# Patient Record
Sex: Male | Born: 1960
Health system: Southern US, Community
[De-identification: ages and names within clinical notes are randomized; demographics above are authoritative.]

## PROBLEM LIST (undated history)

## (undated) DIAGNOSIS — E119 Type 2 diabetes mellitus without complications: Secondary | ICD-10-CM

## (undated) DIAGNOSIS — Z973 Presence of spectacles and contact lenses: Secondary | ICD-10-CM

## (undated) DIAGNOSIS — N400 Enlarged prostate without lower urinary tract symptoms: Secondary | ICD-10-CM

## (undated) DIAGNOSIS — J189 Pneumonia, unspecified organism: Secondary | ICD-10-CM

## (undated) DIAGNOSIS — M1990 Unspecified osteoarthritis, unspecified site: Secondary | ICD-10-CM

## (undated) DIAGNOSIS — M5030 Other cervical disc degeneration, unspecified cervical region: Secondary | ICD-10-CM

## (undated) DIAGNOSIS — M25519 Pain in unspecified shoulder: Secondary | ICD-10-CM

## (undated) DIAGNOSIS — E785 Hyperlipidemia, unspecified: Secondary | ICD-10-CM

## (undated) DIAGNOSIS — K5790 Diverticulosis of intestine, part unspecified, without perforation or abscess without bleeding: Secondary | ICD-10-CM

## (undated) DIAGNOSIS — G43909 Migraine, unspecified, not intractable, without status migrainosus: Secondary | ICD-10-CM

## (undated) DIAGNOSIS — K648 Other hemorrhoids: Secondary | ICD-10-CM

## (undated) DIAGNOSIS — R7612 Nonspecific reaction to cell mediated immunity measurement of gamma interferon antigen response without active tuberculosis: Secondary | ICD-10-CM

## (undated) DIAGNOSIS — F419 Anxiety disorder, unspecified: Secondary | ICD-10-CM

## (undated) DIAGNOSIS — K219 Gastro-esophageal reflux disease without esophagitis: Secondary | ICD-10-CM

## (undated) DIAGNOSIS — I700 Atherosclerosis of aorta: Secondary | ICD-10-CM

## (undated) DIAGNOSIS — G894 Chronic pain syndrome: Secondary | ICD-10-CM

## (undated) DIAGNOSIS — M6740 Ganglion, unspecified site: Secondary | ICD-10-CM

## (undated) DIAGNOSIS — M75101 Unspecified rotator cuff tear or rupture of right shoulder, not specified as traumatic: Secondary | ICD-10-CM

## (undated) HISTORY — PX: COLONOSCOPY: SHX174

## (undated) HISTORY — PX: SHOULDER ARTHROSCOPY WITH ROTATOR CUFF REPAIR: SHX5685

## (undated) HISTORY — PX: KNEE ARTHROSCOPY W/ MENISCAL REPAIR: SHX1877

## (undated) HISTORY — DX: Hyperlipidemia, unspecified: E78.5

## (undated) HISTORY — PX: ANAL FISSURE REPAIR: SHX2312

## (undated) HISTORY — PX: SEPTOPLASTY: SUR1290

## (undated) HISTORY — PX: TONSILLECTOMY: SUR1361

---

## 2004-10-13 HISTORY — PX: OTHER SURGICAL HISTORY: SHX169

## 2006-10-13 HISTORY — PX: OTHER SURGICAL HISTORY: SHX169

## 2013-11-16 DIAGNOSIS — E1151 Type 2 diabetes mellitus with diabetic peripheral angiopathy without gangrene: Secondary | ICD-10-CM | POA: Insufficient documentation

## 2013-12-12 ENCOUNTER — Emergency Department (INDEPENDENT_AMBULATORY_CARE_PROVIDER_SITE_OTHER)
Admission: EM | Admit: 2013-12-12 | Discharge: 2013-12-12 | Disposition: A | Discharge status: Home / Self Care | Payer: 59 | Source: Home / Self Care | Attending: Family Medicine | Admitting: Family Medicine

## 2013-12-12 ENCOUNTER — Encounter (HOSPITAL_COMMUNITY): Payer: Self-pay | Admitting: Emergency Medicine

## 2013-12-12 DIAGNOSIS — E860 Dehydration: Secondary | ICD-10-CM

## 2013-12-12 DIAGNOSIS — J189 Pneumonia, unspecified organism: Secondary | ICD-10-CM

## 2013-12-12 MED ORDER — SODIUM CHLORIDE 0.9 % IV BOLUS (SEPSIS)
1000.0000 mL | Freq: Once | INTRAVENOUS | Status: AC
  Administered 2013-12-12: 1000 mL via INTRAVENOUS

## 2013-12-12 NOTE — ED Provider Notes (Signed)
[name] is a 53 y.o. male who presents to Urgent Care today for cough and dehydration. 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 Patient was diagnosed by a colleague with community-acquired pneumonia/influenza. He is currently being treated with azithromycin and Tamiflu. He notes that he feels fatigued and lightheaded at times. He suspects that he is somewhat dehydrated. He denies any current shortness of breath nausea vomiting or diarrhea. He notes a productive cough. No history of smoking.   Past Medical History  Diagnosis Date  . Diabetes mellitus without complication    History  Substance Use Topics  . Smoking status: Never Smoker   . Smokeless tobacco: Not on file  . Alcohol Use: Yes   ROS as above Medications: Current Facility-Administered Medications  Medication Dose Route Frequency Provider Last Rate Last Dose  . sodium chloride 0.9 % bolus 1,000 mL  1,000 mL Intravenous Once Gregor Hams, MD       Current Outpatient Prescriptions  Medication Sig Dispense Refill  . butalbital-acetaminophen-caffeine (FIORICET, ESGIC) 50-325-40 MG per tablet Take by mouth 2 (two) times daily as needed for headache.      . diazepam (VALIUM) 5 MG tablet Take 5 mg by mouth every 6 (six) hours as needed for anxiety.      . metFORMIN (GLUCOPHAGE) 1000 MG tablet Take 1,000 mg by mouth 2 (two) times daily with a meal.      . oxycodone (OXY-IR) 5 MG capsule Take 5 mg by mouth every 4 (four) hours as needed.      . SUMAtriptan (IMITREX) 50 MG tablet Take 50 mg by mouth every 2 (two) hours as needed for migraine or headache. May repeat in 2 hours if headache persists or recurs.      . traMADol (ULTRAM) 50 MG tablet Take by mouth every 6 (six) hours as needed.        Exam:  BP 139/93  Pulse 96  Temp(Src) 99 F (37.2 C) (Oral)  Resp 16  SpO2 100% Gen: Well NAD HEENT: EOMI,  MMM Lungs: Normal work of breathing. CTABL Heart: RRR no MRG Abd: NABS, Soft. NT, ND Exts: Brisk capillary refill,  warm and well perfused.   Patient was given 1 L normal saline bolus and felt much better  No results found for this or any previous visit (from the past 24 hour(s)). No results found.  Assessment and Plan: 53 y.o. male with community acquired pneumonia with mild dehydration improving with IV fluids. Plan for home oral fluids ad lib. Continue Tylenol and NSAIDs as needed. Complete course of azithromycin and Tamiflu. Followup as needed.  Discussed warning signs or symptoms. Please see discharge instructions. Patient expresses understanding.    Gregor Hams, MD 12/12/13 [phone]

## 2013-12-12 NOTE — ED Notes (Signed)
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 Wants to be "topped off w a couple of liters of fluids "

## 2013-12-12 NOTE — Discharge Instructions (Signed)
Thank you for coming in today. Call or go to the emergency room if you get worse, have trouble breathing, have chest pains, or palpitations.    

## 2015-07-09 DIAGNOSIS — Z96649 Presence of unspecified artificial hip joint: Secondary | ICD-10-CM | POA: Insufficient documentation

## 2015-07-11 ENCOUNTER — Other Ambulatory Visit: Payer: Self-pay | Admitting: Internal Medicine

## 2015-07-11 DIAGNOSIS — M542 Cervicalgia: Secondary | ICD-10-CM

## 2015-07-11 DIAGNOSIS — M545 Low back pain: Secondary | ICD-10-CM

## 2015-07-16 ENCOUNTER — Other Ambulatory Visit (HOSPITAL_COMMUNITY): Payer: Self-pay | Admitting: Orthopedic Surgery

## 2015-07-16 ENCOUNTER — Other Ambulatory Visit (HOSPITAL_COMMUNITY): Payer: Self-pay | Admitting: Internal Medicine

## 2015-07-16 DIAGNOSIS — M542 Cervicalgia: Secondary | ICD-10-CM

## 2015-07-16 DIAGNOSIS — M25561 Pain in right knee: Secondary | ICD-10-CM

## 2015-07-16 DIAGNOSIS — M545 Low back pain: Secondary | ICD-10-CM

## 2015-07-17 ENCOUNTER — Ambulatory Visit (HOSPITAL_COMMUNITY): Payer: 59

## 2015-07-23 ENCOUNTER — Ambulatory Visit (HOSPITAL_COMMUNITY)
Admission: RE | Admit: 2015-07-23 | Discharge: 2015-07-23 | Disposition: A | Discharge status: Home / Self Care | Payer: 59 | Source: Ambulatory Visit | Attending: Internal Medicine | Admitting: Internal Medicine

## 2015-07-23 ENCOUNTER — Ambulatory Visit (HOSPITAL_COMMUNITY)
Admission: RE | Admit: 2015-07-23 | Discharge: 2015-07-23 | Disposition: A | Discharge status: Home / Self Care | Payer: 59 | Source: Ambulatory Visit | Attending: Orthopedic Surgery | Admitting: Orthopedic Surgery

## 2015-07-23 ENCOUNTER — Other Ambulatory Visit (HOSPITAL_COMMUNITY)

## 2015-07-23 DIAGNOSIS — M50322 Other cervical disc degeneration at C5-C6 level: Secondary | ICD-10-CM | POA: Insufficient documentation

## 2015-07-23 DIAGNOSIS — M5136 Other intervertebral disc degeneration, lumbar region: Secondary | ICD-10-CM | POA: Insufficient documentation

## 2015-07-23 DIAGNOSIS — M542 Cervicalgia: Secondary | ICD-10-CM | POA: Diagnosis present

## 2015-07-23 DIAGNOSIS — M545 Low back pain: Secondary | ICD-10-CM

## 2015-07-23 DIAGNOSIS — M5127 Other intervertebral disc displacement, lumbosacral region: Secondary | ICD-10-CM | POA: Diagnosis not present

## 2015-07-23 DIAGNOSIS — M25561 Pain in right knee: Secondary | ICD-10-CM

## 2015-10-26 MED FILL — metFORMIN HCL 1000 MG TABS: 1000 | 30 days supply | Qty: 60 | Fill #2

## 2015-10-26 MED FILL — RIZATRIPTAN 5 MG ODT: 5 | 30 days supply | Qty: 9 | Fill #5

## 2015-10-29 DIAGNOSIS — M542 Cervicalgia: Secondary | ICD-10-CM | POA: Diagnosis not present

## 2015-10-29 DIAGNOSIS — M545 Low back pain: Secondary | ICD-10-CM | POA: Diagnosis not present

## 2015-10-29 DIAGNOSIS — M546 Pain in thoracic spine: Secondary | ICD-10-CM | POA: Diagnosis not present

## 2015-10-29 DIAGNOSIS — G44209 Tension-type headache, unspecified, not intractable: Secondary | ICD-10-CM | POA: Diagnosis not present

## 2015-10-29 MED FILL — LIDOCAINE 5% PATCH: 5 | 90 days supply | Qty: 180 | Fill #1

## 2015-11-12 MED FILL — CIALIS 5 MG TABLET: 5 | 90 days supply | Qty: 90 | Fill #1

## 2015-11-14 MED FILL — PRAVASTATIN NA 40 MG TAB: 40 | 90 days supply | Qty: 90 | Fill #0

## 2015-11-14 MED FILL — TAMSULOSIN HCL 0.4 MG CAP: 0.4 | 90 days supply | Qty: 90 | Fill #1

## 2015-11-14 MED FILL — BUTALBITAL/APAP/CAFFEINE TB: 50-325-40 | 10 days supply | Qty: 60 | Fill #0

## 2015-11-14 MED FILL — diazePAM 5 MG TABS: 5 | 30 days supply | Qty: 60 | Fill #0

## 2015-11-22 MED FILL — levoFLOXacin 750 MG TABS: 750 | 7 days supply | Qty: 7 | Fill #0

## 2015-11-22 MED FILL — metFORMIN HCL 1000 MG TABS: 1000 | 30 days supply | Qty: 60 | Fill #0

## 2015-11-22 MED FILL — OSELTAMIVIR PHOS 75 MG CAP: 75 | 5 days supply | Qty: 10 | Fill #0

## 2015-11-23 ENCOUNTER — Emergency Department (INDEPENDENT_AMBULATORY_CARE_PROVIDER_SITE_OTHER)
Admission: EM | Admit: 2015-11-23 | Discharge: 2015-11-23 | Disposition: A | Discharge status: Home / Self Care | Payer: 59 | Source: Home / Self Care | Attending: Internal Medicine | Admitting: Internal Medicine

## 2015-11-23 ENCOUNTER — Encounter (HOSPITAL_COMMUNITY): Payer: Self-pay | Admitting: *Deleted

## 2015-11-23 DIAGNOSIS — J111 Influenza due to unidentified influenza virus with other respiratory manifestations: Secondary | ICD-10-CM | POA: Diagnosis not present

## 2015-11-23 DIAGNOSIS — E860 Dehydration: Secondary | ICD-10-CM | POA: Diagnosis not present

## 2015-11-23 DIAGNOSIS — R69 Illness, unspecified: Principal | ICD-10-CM

## 2015-11-23 MED ORDER — SODIUM CHLORIDE 0.9 % IV BOLUS (SEPSIS)
1000.0000 mL | Freq: Once | INTRAVENOUS | Status: AC
  Administered 2015-11-23: 1000 mL via INTRAVENOUS

## 2015-11-23 NOTE — ED Provider Notes (Addendum)
CSN: [id number]     Arrival date & time 11/23/15  1307 History   First MD Initiated Contact with Patient 11/23/15 1344     Chief Complaint  Patient presents with  . 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 He was started empirically on Tamiflu and Levaquin by a coworker. Malaise, achiness. Low-grade temp, 100.1 at the highest. Had a flu shot this season. No diarrhea, some posttussive emesis. Concerned about dehydration.  Past Medical History  Diagnosis Date  . Diabetes mellitus without complication ([place])    History reviewed. No pertinent past surgical history. History reviewed. No pertinent family history. Social History  Substance Use Topics  . Smoking status: Never Smoker   . Smokeless tobacco: None  . Alcohol Use: Yes    Review of Systems  All other systems reviewed and are negative.   Allergies  Benzoin compound  Home Medications   Prior to Admission medications   Medication Sig Start Date End Date Taking? Authorizing Provider  butalbital-acetaminophen-caffeine (FIORICET, ESGIC) 50-325-40 MG per tablet Take by mouth 2 (two) times daily as needed for headache.    Historical Provider, MD  diazepam (VALIUM) 5 MG tablet Take 5 mg by mouth every 6 (six) hours as needed for anxiety.    Historical Provider, MD  metFORMIN (GLUCOPHAGE) 1000 MG tablet Take 1,000 mg by mouth 2 (two) times daily with a meal.    Historical Provider, MD  oxycodone (OXY-IR) 5 MG capsule Take 5 mg by mouth every 4 (four) hours as needed.    Historical Provider, MD  SUMAtriptan (IMITREX) 50 MG tablet Take 50 mg by mouth every 2 (two) hours as needed for migraine or headache. May repeat in 2 hours if headache persists or recurs.    Historical Provider, MD  traMADol (ULTRAM) 50 MG tablet Take by mouth every 6 (six) hours as needed.    Historical Provider, MD   Meds Ordered and Administered this Visit    Medications  sodium chloride 0.9 % bolus 1,000 mL (1,000 mLs Intravenous Given 11/23/15 1434)    BP 162/86 mmHg  Pulse 104  Temp(Src) 99.4 F (37.4 C) (Oral)  Resp 16  SpO2 95%   Physical Exam  Constitutional: He is oriented to person, place, and time. No distress.  Alert, nicely groomed Sitting up on end of exam table  HENT:  Head: Atraumatic.  Bilateral TMs are fairly dull, not bulging, patchy erythema Moderate nasal congestion Throat is pink, oral tissues are quite dry  Eyes:  Conjugate gaze, no eye redness/drainage  Neck:  Turns head freely  Cardiovascular: Normal rate and regular rhythm.   Pulmonary/Chest: No respiratory distress. He has no wheezes. He has no rales.  Lungs clear, symmetric breath sounds  Abdominal: He exhibits no distension.  Neurological: He is alert and oriented to person, place, and time.  Skin: Skin is warm and dry.  No cyanosis  Nursing note and vitals reviewed.   ED Course  Procedures (including critical care time)  IVF: 2 L normal saline   MDM   1. Influenza-like illness    Finish meds (tamiflu, levaquin).  Push fluids.  Note for work x 3d (11/26/15); recheck if not able to return to work.    Sherlene Shams, MD 11/23/15 1543  Sherlene Shams, MD 11/29/15 1248

## 2015-11-23 NOTE — Discharge Instructions (Signed)
Continue tamiflu, levaquin Push fluids Recheck for persistent light-headedness, if not able to drink fluids, increasing fever, increasing phlegm production.   Note for work x 3d.  Recheck if not able to RTW 11/26/15.

## 2015-11-23 NOTE — ED Notes (Signed)
Pt  Reports    Cough  Congested     Lightheaded              Dry   mouth  Symptoms  X  3  Days       Dry   Mucous  Membranes           Vomited  X  1  No  Diarrhea  Thick  Yellow   Mucous

## 2015-12-03 DIAGNOSIS — M25562 Pain in left knee: Secondary | ICD-10-CM | POA: Diagnosis not present

## 2015-12-03 DIAGNOSIS — G8929 Other chronic pain: Secondary | ICD-10-CM | POA: Diagnosis not present

## 2015-12-03 DIAGNOSIS — M1732 Unilateral post-traumatic osteoarthritis, left knee: Secondary | ICD-10-CM | POA: Diagnosis not present

## 2015-12-03 DIAGNOSIS — Z6841 Body Mass Index (BMI) 40.0 and over, adult: Secondary | ICD-10-CM | POA: Diagnosis not present

## 2015-12-03 DIAGNOSIS — M179 Osteoarthritis of knee, unspecified: Secondary | ICD-10-CM | POA: Diagnosis not present

## 2015-12-17 ENCOUNTER — Encounter: Payer: 59 | Attending: Physical Medicine & Rehabilitation

## 2015-12-17 ENCOUNTER — Ambulatory Visit (HOSPITAL_BASED_OUTPATIENT_CLINIC_OR_DEPARTMENT_OTHER): Payer: 59 | Admitting: Physical Medicine & Rehabilitation

## 2015-12-17 ENCOUNTER — Encounter: Payer: Self-pay | Admitting: Physical Medicine & Rehabilitation

## 2015-12-17 VITALS — BP 141/75 | HR 93 | Resp 14

## 2015-12-17 DIAGNOSIS — G43809 Other migraine, not intractable, without status migrainosus: Secondary | ICD-10-CM | POA: Insufficient documentation

## 2015-12-17 DIAGNOSIS — M5416 Radiculopathy, lumbar region: Secondary | ICD-10-CM | POA: Insufficient documentation

## 2015-12-17 DIAGNOSIS — M1712 Unilateral primary osteoarthritis, left knee: Secondary | ICD-10-CM | POA: Diagnosis not present

## 2015-12-17 DIAGNOSIS — M5127 Other intervertebral disc displacement, lumbosacral region: Secondary | ICD-10-CM | POA: Diagnosis not present

## 2015-12-17 DIAGNOSIS — M545 Low back pain, unspecified: Secondary | ICD-10-CM | POA: Insufficient documentation

## 2015-12-17 DIAGNOSIS — G8929 Other chronic pain: Secondary | ICD-10-CM | POA: Insufficient documentation

## 2015-12-17 DIAGNOSIS — G43719 Chronic migraine without aura, intractable, without status migrainosus: Secondary | ICD-10-CM | POA: Diagnosis not present

## 2015-12-17 DIAGNOSIS — M5382 Other specified dorsopathies, cervical region: Secondary | ICD-10-CM

## 2015-12-17 DIAGNOSIS — M50322 Other cervical disc degeneration at C5-C6 level: Secondary | ICD-10-CM | POA: Diagnosis not present

## 2015-12-17 DIAGNOSIS — M47812 Spondylosis without myelopathy or radiculopathy, cervical region: Secondary | ICD-10-CM | POA: Insufficient documentation

## 2015-12-17 DIAGNOSIS — M542 Cervicalgia: Secondary | ICD-10-CM | POA: Diagnosis present

## 2015-12-17 NOTE — Progress Notes (Signed)
Subjective:    Patient ID: [name], male    DOB: May 22, 1961, 55 y.o.   MRN: [id number]  HPI CC:  Chronic migraines associated with neck pain  Patient has had symptoms for several years. He's had a long history of low back as well as neck pain. 15-30days of migraines per month, previous good relief with botox using migraine protocol, last injection in 2014. He currently uses Fioricet when necessary as well as Maxalt when the headaches become intractable.Migraines are not accompanied by aura not accompanied by weakness He feels like his cervical facets lock up on the right side and chiropractic care is helpful for this. Rare radicular symptoms In the Right upper extremity. Also had good result with cervical ESI DC helps with back and cervical facets  Tried acupuncture in past for neck and migraines which was not very helpful  Other pain complaints include left greater than right knee. He has seen orthopedics for his left knee and is planning on a total knee replacement. The right knee has several degenerative tears but no immediate surgical plans.  Primary care physician is prescribing all of his medications including oxycodone 5 mg 1-2 tablets by mouth every 8 hours as needed  Other past medical history significant for diabetes and BPH. Pain Inventory Average Pain 4 Pain Right Now 6 My pain is constant, sharp, burning and tingling  In the last 24 hours, has pain interfered with the following? General activity 8 Relation with others 8 Enjoyment of life 8 What TIME of day is your pain at its worst? daytime, night Sleep (in general) NA  Pain is worse with: NA Pain improves with: heat/ice, therapy/exercise, medication and injections Relief from Meds: 5  Mobility Do you have any goals in this area?  no  Function employed # of hrs/week 98 Do you have any goals in this area?  yes  Neuro/Psych trouble walking spasms  Prior Studies x-rays CT/MRI MRI CERVICAL AND  LUMBAR SPINE WITHOUT CONTRAST  TECHNIQUE: Multiplanar and multiecho pulse sequences of the cervical spine, to include the craniocervical junction and cervicothoracic junction, and lumbar spine, were obtained without intravenous contrast.  COMPARISON: None.  FINDINGS: MRI CERVICAL SPINE FINDINGS  Normal cervical alignment. Negative for fracture or mass lesion. No acute bony abnormality. Spinal canal adequate in size. Spinal cord signal is normal. No cord lesion or cord compression.  C2-3: Negative  C3-4: Negative  C4-5: Negative  C5-6: Mild disc degeneration and mild uncinate spurring left greater than right. Mild left foraminal narrowing due to spurring.  C6-7: Negative  C7-T1: Negative  MRI LUMBAR SPINE FINDINGS  Normal lumbar alignment. Negative for fracture or mass. Conus medullaris is normal and terminates at mid L2  L1-2: Negative  L2-3: Mild disc degeneration without stenosis. Early facet degeneration  L3-4: Mild disc and mild facet degeneration without spinal stenosis. Neural foramina patent.  L4-5: Mild disc and facet degeneration without spinal or foraminal stenosis  L5-S1: Small left paracentral disc protrusion which is touching the left S1 nerve root. No nerve root compression. There is bilateral facet degeneration without spinal stenosis.  IMPRESSION: Mild cervical degenerative change. Mild left foraminal narrowing C5-6 due to spurring.  Small left paracentral disc protrusion L5-S1 which is touching the left S1 nerve root.   Electronically Signed  By: Franchot Gallo M.D.  On: 07/23/2015 19:49       Physicians involved in your care Primary care . Orthopedist . Psychologist .   No family history on file. Social History  Social History  . Marital Status: Married    Spouse Name: N/A  . Number of Children: N/A  . Years of Education: N/A   Social History Main Topics  . Smoking status: Never Smoker   .  Smokeless tobacco: None  . Alcohol Use: Yes  . Drug Use: None  . Sexual Activity: Not Asked   Other Topics Concern  . None   Social History Narrative   Past Surgical History  Procedure Laterality Date  . Meniscus repair  2008, 1993  . Hip resurface  2006  . Slap tear repair  2008   Past Medical History  Diagnosis Date  . Diabetes mellitus without complication ([place])   . Arthritis    BP 141/75 mmHg  Pulse 93  Resp 14  SpO2 96%  Opioid Risk Score:   Fall Risk Score:  `1  Depression screen PHQ 2/9  No flowsheet data found.   Review of Systems  Constitutional: Positive for unexpected weight change.  Genitourinary: Positive for difficulty urinating.  Musculoskeletal: Positive for gait problem.  Neurological:       Spasms   All other systems reviewed and are negative.      Objective:   Physical Exam  Constitutional: He is oriented to person, place, and time. He appears well-developed and well-nourished.  HENT:  Head: Normocephalic and atraumatic.  Eyes: Conjunctivae and EOM are normal. Pupils are equal, round, and reactive to light.  Neck: Normal range of motion. Neck supple.  Musculoskeletal:       Cervical back: He exhibits no deformity.       Lumbar back: He exhibits decreased range of motion. He exhibits no deformity and no spasm.  Negative straight leg raising  Neurological: He is alert and oriented to person, place, and time. He has normal strength. He displays no atrophy. No sensory deficit. Coordination normal.  Reflex Scores:      Tricep reflexes are 1+ on the right side and 1+ on the left side.      Bicep reflexes are 1+ on the right side and 1+ on the left side.      Brachioradialis reflexes are 1+ on the right side and 1+ on the left side.      Patellar reflexes are 1+ on the right side and 1+ on the left side.      Achilles reflexes are 1+ on the right side and 1+ on the left side. Nursing note and vitals reviewed.  No evidence of foot deformities  no callus formation Sensation- intact in bilateral C5,6,7,8L2,3,4,5,S1      Assessment & Plan:  1. Chronic migraines without aura, Already on medication management but still having 15 or more migraine episodes per month. Previously had good result with Botox prophylaxis, last injection in 2014. Would recommend reinstituting on a every 3 month basis.  2. Cervical degenerative disc and mild spondylosis. Given his facet symptoms of locking, may be a good candidate for cervical medial branch blocks on the right side C4 C5 C6  Primary care will manage medications  3. Chronic low back pain, chiropractic care, medications per family practice. At this point he does not wish to consider lumbar injections as he had a bad experience in the past for this.  4. Knee osteoarthritis left, follows with orthopedic surgery, Dr. Maureen Ralphs is planning total knee replacement

## 2015-12-26 DIAGNOSIS — M170 Bilateral primary osteoarthritis of knee: Secondary | ICD-10-CM | POA: Diagnosis not present

## 2016-01-02 DIAGNOSIS — M545 Low back pain: Secondary | ICD-10-CM | POA: Diagnosis not present

## 2016-01-02 DIAGNOSIS — M9904 Segmental and somatic dysfunction of sacral region: Secondary | ICD-10-CM | POA: Diagnosis not present

## 2016-01-02 DIAGNOSIS — M542 Cervicalgia: Secondary | ICD-10-CM | POA: Diagnosis not present

## 2016-01-02 DIAGNOSIS — M9903 Segmental and somatic dysfunction of lumbar region: Secondary | ICD-10-CM | POA: Diagnosis not present

## 2016-01-14 ENCOUNTER — Encounter: Payer: Self-pay | Admitting: Physical Medicine & Rehabilitation

## 2016-01-14 ENCOUNTER — Encounter: Payer: 59 | Attending: Physical Medicine & Rehabilitation

## 2016-01-14 ENCOUNTER — Ambulatory Visit: Payer: 59 | Admitting: Physical Medicine & Rehabilitation

## 2016-01-14 VITALS — BP 148/74 | HR 92

## 2016-01-14 DIAGNOSIS — G8929 Other chronic pain: Secondary | ICD-10-CM | POA: Insufficient documentation

## 2016-01-14 DIAGNOSIS — G43719 Chronic migraine without aura, intractable, without status migrainosus: Secondary | ICD-10-CM | POA: Diagnosis not present

## 2016-01-14 DIAGNOSIS — M50322 Other cervical disc degeneration at C5-C6 level: Secondary | ICD-10-CM | POA: Diagnosis not present

## 2016-01-14 DIAGNOSIS — M542 Cervicalgia: Secondary | ICD-10-CM | POA: Diagnosis present

## 2016-01-14 DIAGNOSIS — G43809 Other migraine, not intractable, without status migrainosus: Secondary | ICD-10-CM | POA: Diagnosis not present

## 2016-01-14 DIAGNOSIS — M5127 Other intervertebral disc displacement, lumbosacral region: Secondary | ICD-10-CM | POA: Diagnosis not present

## 2016-01-14 NOTE — Progress Notes (Signed)
Botox injection for chronic migraine prophylaxis.  Indication: History of migraines with greater than 15 headaches per month despite trials of oral medications.  Informed consent was obtained after discussing risks and benefits of the procedure with the patient this included bleeding bruising infection as well as facial drooping, eyelid lag, systemic effects of Botox. Patient elects to proceed and has given written consent.  Patient placed in a seated position Dilution 50 units per cc  Muscles injected with dose  Procerus 5 units Corrugator 5 units bilateral Frontalis 5 units 2 injection sites on the right and 2 injection sites on the left Temporalis 5 units in 4 injection sites on the right and 4 injection sites on the left Occipitalis 5 units into 3 injections sites on the right and 3 injection sites on the left  Cervical paraspinals 5 units into 2 injection sites on the right and 2 injection sites on the left trapezius 5 units into 3 injection sites on the right and 3 injection sites on the left  Patient tolerated procedure well. Post procedure instructions given. Appointment for repeat injection in 3 months 

## 2016-01-14 NOTE — Patient Instructions (Signed)
Please call if youwould like to set up cervical facet injections

## 2016-01-17 MED FILL — metFORMIN HCL 1000 MG TABS: 1000 | 30 days supply | Qty: 60 | Fill #1

## 2016-01-17 MED FILL — CYCLOBENZAPRINE 10 MG TAB: 10 | 90 days supply | Qty: 270 | Fill #3

## 2016-01-17 MED FILL — diazePAM 5 MG TABS: 5 | 30 days supply | Qty: 60 | Fill #1

## 2016-01-17 MED FILL — BUTALBITAL/APAP/CAFFEINE TB: 50-325-40 | 10 days supply | Qty: 60 | Fill #1

## 2016-01-18 MED FILL — ZOLPIDEM TARTRATE 10 MG TAB: 10 | 90 days supply | Qty: 90 | Fill #0

## 2016-01-18 MED FILL — CELECOXIB 200 MG CAPSULE: 200 | 90 days supply | Qty: 90 | Fill #0

## 2016-01-18 MED FILL — oxyCODONE HCL 5 MG TABS: 5 | 30 days supply | Qty: 180 | Fill #0

## 2016-01-28 MED FILL — BACLOFEN 10 MG TABLET: 10 | 90 days supply | Qty: 360 | Fill #1

## 2016-01-28 MED FILL — TAMSULOSIN HCL 0.4 MG CAP: 0.4 | 90 days supply | Qty: 90 | Fill #2

## 2016-01-28 MED FILL — PRAVASTATIN NA 40 MG TAB: 40 | 90 days supply | Qty: 90 | Fill #1

## 2016-01-28 MED FILL — traMADol HCL 50 MG TABS: 50 | 30 days supply | Qty: 240 | Fill #0

## 2016-02-11 DIAGNOSIS — M545 Low back pain: Secondary | ICD-10-CM | POA: Diagnosis not present

## 2016-02-11 DIAGNOSIS — M9904 Segmental and somatic dysfunction of sacral region: Secondary | ICD-10-CM | POA: Diagnosis not present

## 2016-02-11 DIAGNOSIS — M542 Cervicalgia: Secondary | ICD-10-CM | POA: Diagnosis not present

## 2016-02-11 DIAGNOSIS — M9903 Segmental and somatic dysfunction of lumbar region: Secondary | ICD-10-CM | POA: Diagnosis not present

## 2016-02-13 DIAGNOSIS — M1712 Unilateral primary osteoarthritis, left knee: Secondary | ICD-10-CM | POA: Diagnosis not present

## 2016-02-13 DIAGNOSIS — M25531 Pain in right wrist: Secondary | ICD-10-CM | POA: Diagnosis not present

## 2016-02-20 DIAGNOSIS — M1712 Unilateral primary osteoarthritis, left knee: Secondary | ICD-10-CM | POA: Diagnosis not present

## 2016-02-26 DIAGNOSIS — M1712 Unilateral primary osteoarthritis, left knee: Secondary | ICD-10-CM | POA: Diagnosis not present

## 2016-03-04 MED FILL — RIZATRIPTAN 5 MG ODT: 5 | 30 days supply | Qty: 9 | Fill #0

## 2016-03-04 MED FILL — diazePAM 5 MG TABS: 5 | 30 days supply | Qty: 60 | Fill #2

## 2016-03-04 MED FILL — CIALIS 5 MG TABLET: 5 | 90 days supply | Qty: 90 | Fill #2

## 2016-03-04 MED FILL — metFORMIN HCL 1000 MG TABS: 1000 | 30 days supply | Qty: 60 | Fill #2

## 2016-03-05 DIAGNOSIS — M9904 Segmental and somatic dysfunction of sacral region: Secondary | ICD-10-CM | POA: Diagnosis not present

## 2016-03-05 DIAGNOSIS — M542 Cervicalgia: Secondary | ICD-10-CM | POA: Diagnosis not present

## 2016-03-05 DIAGNOSIS — M9903 Segmental and somatic dysfunction of lumbar region: Secondary | ICD-10-CM | POA: Diagnosis not present

## 2016-03-05 DIAGNOSIS — M545 Low back pain: Secondary | ICD-10-CM | POA: Diagnosis not present

## 2016-03-25 DIAGNOSIS — M542 Cervicalgia: Secondary | ICD-10-CM | POA: Diagnosis not present

## 2016-03-25 DIAGNOSIS — M545 Low back pain: Secondary | ICD-10-CM | POA: Diagnosis not present

## 2016-03-25 DIAGNOSIS — M9903 Segmental and somatic dysfunction of lumbar region: Secondary | ICD-10-CM | POA: Diagnosis not present

## 2016-03-25 DIAGNOSIS — M9904 Segmental and somatic dysfunction of sacral region: Secondary | ICD-10-CM | POA: Diagnosis not present

## 2016-03-25 MED FILL — TAMSULOSIN HCL 0.4 MG CAP: 0.4 | 90 days supply | Qty: 180 | Fill #0

## 2016-04-02 DIAGNOSIS — M1812 Unilateral primary osteoarthritis of first carpometacarpal joint, left hand: Secondary | ICD-10-CM | POA: Diagnosis not present

## 2016-04-02 DIAGNOSIS — M25531 Pain in right wrist: Secondary | ICD-10-CM | POA: Diagnosis not present

## 2016-04-02 DIAGNOSIS — S63501A Unspecified sprain of right wrist, initial encounter: Secondary | ICD-10-CM | POA: Diagnosis not present

## 2016-04-17 MED FILL — diazePAM 5 MG TABS: 5 | 30 days supply | Qty: 60 | Fill #0

## 2016-04-17 MED FILL — LIDOCAINE 5% PATCH: 5 | 90 days supply | Qty: 180 | Fill #2

## 2016-04-17 MED FILL — BUTALB-ACETAMIN-CAFF 50-325: 50-325-40 | 10 days supply | Qty: 60 | Fill #2

## 2016-04-17 MED FILL — CYCLOBENZAPRINE 10 MG TAB: 10 | 90 days supply | Qty: 270 | Fill #0

## 2016-04-17 MED FILL — CELECOXIB 200 MG CAPSULE: 200 | 90 days supply | Qty: 90 | Fill #1

## 2016-04-17 MED FILL — ZOLPIDEM TARTRATE 10 MG TAB: 10 | 90 days supply | Qty: 90 | Fill #1

## 2016-04-17 MED FILL — PRAVASTATIN NA 40 MG TAB: 40 | 90 days supply | Qty: 90 | Fill #2

## 2016-04-17 MED FILL — KETOCONAZOLE 2% CREAM: 2 | 20 days supply | Qty: 30 | Fill #0

## 2016-04-17 MED FILL — FLUTICASONE PROP 50 MCG SPR: 50 | 60 days supply | Qty: 16 | Fill #1

## 2016-04-18 DIAGNOSIS — M542 Cervicalgia: Secondary | ICD-10-CM | POA: Diagnosis not present

## 2016-04-18 DIAGNOSIS — M9904 Segmental and somatic dysfunction of sacral region: Secondary | ICD-10-CM | POA: Diagnosis not present

## 2016-04-18 DIAGNOSIS — M9903 Segmental and somatic dysfunction of lumbar region: Secondary | ICD-10-CM | POA: Diagnosis not present

## 2016-04-18 DIAGNOSIS — M545 Low back pain: Secondary | ICD-10-CM | POA: Diagnosis not present

## 2016-04-18 MED FILL — oxyCODONE HCL 5 MG TABS: 5 | 30 days supply | Qty: 180 | Fill #0

## 2016-04-21 ENCOUNTER — Ambulatory Visit (HOSPITAL_BASED_OUTPATIENT_CLINIC_OR_DEPARTMENT_OTHER): Payer: 59 | Admitting: Physical Medicine & Rehabilitation

## 2016-04-21 ENCOUNTER — Encounter: Payer: 59 | Attending: Physical Medicine & Rehabilitation

## 2016-04-21 ENCOUNTER — Encounter: Payer: Self-pay | Admitting: Physical Medicine & Rehabilitation

## 2016-04-21 VITALS — BP 125/83 | HR 96 | Resp 14

## 2016-04-21 DIAGNOSIS — M25562 Pain in left knee: Secondary | ICD-10-CM | POA: Diagnosis not present

## 2016-04-21 DIAGNOSIS — G43809 Other migraine, not intractable, without status migrainosus: Secondary | ICD-10-CM | POA: Diagnosis not present

## 2016-04-21 DIAGNOSIS — G43719 Chronic migraine without aura, intractable, without status migrainosus: Secondary | ICD-10-CM | POA: Diagnosis not present

## 2016-04-21 DIAGNOSIS — M50322 Other cervical disc degeneration at C5-C6 level: Secondary | ICD-10-CM | POA: Insufficient documentation

## 2016-04-21 DIAGNOSIS — M542 Cervicalgia: Secondary | ICD-10-CM | POA: Diagnosis present

## 2016-04-21 DIAGNOSIS — G8929 Other chronic pain: Secondary | ICD-10-CM | POA: Diagnosis not present

## 2016-04-21 DIAGNOSIS — M5127 Other intervertebral disc displacement, lumbosacral region: Secondary | ICD-10-CM | POA: Insufficient documentation

## 2016-04-21 NOTE — Progress Notes (Signed)
Botox injection for chronic migraine prophylaxis.  Indication: History of migraines with greater than 15 headaches per month despite trials of oral medications.  Informed consent was obtained after discussing risks and benefits of the procedure with the patient this included bleeding bruising infection as well as facial drooping, eyelid lag, systemic effects of Botox. Patient elects to proceed and has given written consent.  Patient placed in a seated position Dilution 50 units per cc  Muscles injected with dose  Procerus 5 units Corrugator 5 units bilateral Frontalis 5 units 2 injection sites on the right and 2 injection sites on the left Temporalis 5 units in 4 injection sites on the right and 4 injection sites on the left Occipitalis 5 units into 3 injections sites on the right and 3 injection sites on the left  Cervical paraspinals 5 units into 2 injection sites on the right and 2 injection sites on the left trapezius 5 units into 3 injection sites on the right and 3 injection sites on the left  Patient tolerated procedure well. Post procedure instructions given. Appointment for repeat injection in 3 months 

## 2016-04-21 NOTE — Patient Instructions (Signed)
You received a botulinum toxin injection for chronic headache prevention. You may have soreness in your injection site, you may use ice for 20-30 minutes every 2 hours to help with soreness.  Maximum effect of the injection will be at around 1 week. Other side effects include possible muscle weakness which will subside as the medication wears off.  Medication may be repeated every 3 months as needed. 

## 2016-05-06 MED FILL — RIZATRIPTAN 5 MG ODT: 5 | 30 days supply | Qty: 9 | Fill #1

## 2016-05-09 MED FILL — metFORMIN HCL 1000 MG TABS: 1000 | 30 days supply | Qty: 60 | Fill #3

## 2016-05-14 DIAGNOSIS — M545 Low back pain: Secondary | ICD-10-CM | POA: Diagnosis not present

## 2016-05-14 DIAGNOSIS — M542 Cervicalgia: Secondary | ICD-10-CM | POA: Diagnosis not present

## 2016-05-14 DIAGNOSIS — M9904 Segmental and somatic dysfunction of sacral region: Secondary | ICD-10-CM | POA: Diagnosis not present

## 2016-05-14 DIAGNOSIS — M9903 Segmental and somatic dysfunction of lumbar region: Secondary | ICD-10-CM | POA: Diagnosis not present

## 2016-06-02 DIAGNOSIS — M9903 Segmental and somatic dysfunction of lumbar region: Secondary | ICD-10-CM | POA: Diagnosis not present

## 2016-06-02 DIAGNOSIS — M9905 Segmental and somatic dysfunction of pelvic region: Secondary | ICD-10-CM | POA: Diagnosis not present

## 2016-06-02 DIAGNOSIS — M9904 Segmental and somatic dysfunction of sacral region: Secondary | ICD-10-CM | POA: Diagnosis not present

## 2016-06-02 DIAGNOSIS — M545 Low back pain: Secondary | ICD-10-CM | POA: Diagnosis not present

## 2016-06-03 MED FILL — BACLOFEN 10 MG TABLET: 10 | 90 days supply | Qty: 360 | Fill #0

## 2016-06-03 MED FILL — CIALIS 5 MG TABLET: 5 | 90 days supply | Qty: 90 | Fill #3

## 2016-06-03 MED FILL — metFORMIN HCL 1000 MG TABS: 1000 | 30 days supply | Qty: 60 | Fill #4

## 2016-06-04 DIAGNOSIS — M9903 Segmental and somatic dysfunction of lumbar region: Secondary | ICD-10-CM | POA: Diagnosis not present

## 2016-06-04 DIAGNOSIS — M9905 Segmental and somatic dysfunction of pelvic region: Secondary | ICD-10-CM | POA: Diagnosis not present

## 2016-06-04 DIAGNOSIS — S63501D Unspecified sprain of right wrist, subsequent encounter: Secondary | ICD-10-CM | POA: Diagnosis not present

## 2016-06-04 DIAGNOSIS — M545 Low back pain: Secondary | ICD-10-CM | POA: Diagnosis not present

## 2016-06-04 DIAGNOSIS — M25531 Pain in right wrist: Secondary | ICD-10-CM | POA: Diagnosis not present

## 2016-06-04 DIAGNOSIS — M979XXD Periprosthetic fracture around unspecified internal prosthetic joint, subsequent encounter: Secondary | ICD-10-CM | POA: Diagnosis not present

## 2016-06-04 DIAGNOSIS — M1812 Unilateral primary osteoarthritis of first carpometacarpal joint, left hand: Secondary | ICD-10-CM | POA: Diagnosis not present

## 2016-06-11 DIAGNOSIS — M545 Low back pain: Secondary | ICD-10-CM | POA: Diagnosis not present

## 2016-06-11 DIAGNOSIS — M9905 Segmental and somatic dysfunction of pelvic region: Secondary | ICD-10-CM | POA: Diagnosis not present

## 2016-06-11 DIAGNOSIS — M9904 Segmental and somatic dysfunction of sacral region: Secondary | ICD-10-CM | POA: Diagnosis not present

## 2016-06-11 DIAGNOSIS — M9903 Segmental and somatic dysfunction of lumbar region: Secondary | ICD-10-CM | POA: Diagnosis not present

## 2016-06-19 MED FILL — diazePAM 5 MG TABS: 5 | 30 days supply | Qty: 60 | Fill #1

## 2016-06-19 MED FILL — traMADol HCL 50 MG TABS: 50 | 30 days supply | Qty: 240 | Fill #1

## 2016-06-23 MED FILL — AMOXICILLIN 500 MG CAPSULE: 500 | 5 days supply | Qty: 20 | Fill #0

## 2016-06-26 MED FILL — RIZATRIPTAN 5 MG ODT: 5 | 30 days supply | Qty: 9 | Fill #2

## 2016-06-27 MED FILL — AMOXICILLIN 500 MG CAPSULE: 500 | 5 days supply | Qty: 20 | Fill #1

## 2016-07-09 DIAGNOSIS — M9905 Segmental and somatic dysfunction of pelvic region: Secondary | ICD-10-CM | POA: Diagnosis not present

## 2016-07-09 DIAGNOSIS — E1151 Type 2 diabetes mellitus with diabetic peripheral angiopathy without gangrene: Secondary | ICD-10-CM | POA: Diagnosis not present

## 2016-07-09 DIAGNOSIS — M9903 Segmental and somatic dysfunction of lumbar region: Secondary | ICD-10-CM | POA: Diagnosis not present

## 2016-07-09 DIAGNOSIS — I10 Essential (primary) hypertension: Secondary | ICD-10-CM | POA: Diagnosis not present

## 2016-07-09 DIAGNOSIS — E784 Other hyperlipidemia: Secondary | ICD-10-CM | POA: Diagnosis not present

## 2016-07-09 DIAGNOSIS — M9904 Segmental and somatic dysfunction of sacral region: Secondary | ICD-10-CM | POA: Diagnosis not present

## 2016-07-09 DIAGNOSIS — Z125 Encounter for screening for malignant neoplasm of prostate: Secondary | ICD-10-CM | POA: Diagnosis not present

## 2016-07-09 DIAGNOSIS — M545 Low back pain: Secondary | ICD-10-CM | POA: Diagnosis not present

## 2016-07-09 MED FILL — TAMSULOSIN HCL 0.4 MG CAP: 0.4 | 90 days supply | Qty: 180 | Fill #1

## 2016-07-09 MED FILL — AMOXICILLIN 500 MG CAPSULE: 500 | 5 days supply | Qty: 20 | Fill #2

## 2016-07-09 MED FILL — BUTALBITAL/APAP/CAFFEINE TB: 50-325-40 | 10 days supply | Qty: 60 | Fill #3

## 2016-07-14 DIAGNOSIS — Z0000 Encounter for general adult medical examination without abnormal findings: Secondary | ICD-10-CM | POA: Diagnosis not present

## 2016-07-14 DIAGNOSIS — E784 Other hyperlipidemia: Secondary | ICD-10-CM | POA: Diagnosis not present

## 2016-07-14 DIAGNOSIS — E1151 Type 2 diabetes mellitus with diabetic peripheral angiopathy without gangrene: Secondary | ICD-10-CM | POA: Diagnosis not present

## 2016-07-14 DIAGNOSIS — F5104 Psychophysiologic insomnia: Secondary | ICD-10-CM | POA: Diagnosis not present

## 2016-07-14 DIAGNOSIS — M545 Low back pain: Secondary | ICD-10-CM | POA: Diagnosis not present

## 2016-07-14 DIAGNOSIS — I10 Essential (primary) hypertension: Secondary | ICD-10-CM | POA: Diagnosis not present

## 2016-07-14 DIAGNOSIS — R3911 Hesitancy of micturition: Secondary | ICD-10-CM | POA: Diagnosis not present

## 2016-07-14 DIAGNOSIS — I7389 Other specified peripheral vascular diseases: Secondary | ICD-10-CM | POA: Diagnosis not present

## 2016-07-14 DIAGNOSIS — R51 Headache: Secondary | ICD-10-CM | POA: Diagnosis not present

## 2016-07-14 MED FILL — oxyCODONE HCL 5 MG TABS: 5 | 30 days supply | Qty: 180 | Fill #0

## 2016-07-16 DIAGNOSIS — Z1212 Encounter for screening for malignant neoplasm of rectum: Secondary | ICD-10-CM | POA: Diagnosis not present

## 2016-07-16 MED FILL — METHOCARBAMOL 500 MG TABLET: 500 | 90 days supply | Qty: 270 | Fill #0

## 2016-07-16 MED FILL — TOPIRAMATE 25 MG TABLET: 25 | 90 days supply | Qty: 90 | Fill #0

## 2016-07-22 DIAGNOSIS — M9903 Segmental and somatic dysfunction of lumbar region: Secondary | ICD-10-CM | POA: Diagnosis not present

## 2016-07-22 DIAGNOSIS — M9904 Segmental and somatic dysfunction of sacral region: Secondary | ICD-10-CM | POA: Diagnosis not present

## 2016-07-22 DIAGNOSIS — M9905 Segmental and somatic dysfunction of pelvic region: Secondary | ICD-10-CM | POA: Diagnosis not present

## 2016-07-22 DIAGNOSIS — M545 Low back pain: Secondary | ICD-10-CM | POA: Diagnosis not present

## 2016-07-22 DIAGNOSIS — M1712 Unilateral primary osteoarthritis, left knee: Secondary | ICD-10-CM | POA: Diagnosis not present

## 2016-07-28 ENCOUNTER — Encounter: Payer: 59 | Attending: Physical Medicine & Rehabilitation

## 2016-07-28 ENCOUNTER — Ambulatory Visit (HOSPITAL_BASED_OUTPATIENT_CLINIC_OR_DEPARTMENT_OTHER): Payer: 59 | Admitting: Physical Medicine & Rehabilitation

## 2016-07-28 ENCOUNTER — Encounter: Payer: Self-pay | Admitting: Physical Medicine & Rehabilitation

## 2016-07-28 DIAGNOSIS — M542 Cervicalgia: Secondary | ICD-10-CM | POA: Diagnosis present

## 2016-07-28 DIAGNOSIS — G8929 Other chronic pain: Secondary | ICD-10-CM | POA: Insufficient documentation

## 2016-07-28 DIAGNOSIS — M50322 Other cervical disc degeneration at C5-C6 level: Secondary | ICD-10-CM | POA: Diagnosis not present

## 2016-07-28 DIAGNOSIS — G43809 Other migraine, not intractable, without status migrainosus: Secondary | ICD-10-CM | POA: Diagnosis not present

## 2016-07-28 DIAGNOSIS — M5127 Other intervertebral disc displacement, lumbosacral region: Secondary | ICD-10-CM | POA: Diagnosis not present

## 2016-07-28 DIAGNOSIS — G43719 Chronic migraine without aura, intractable, without status migrainosus: Secondary | ICD-10-CM | POA: Diagnosis not present

## 2016-07-28 NOTE — Progress Notes (Signed)
Botox injection for chronic migraine prophylaxis.  Indication: History of migraines with greater than 15 headaches per month despite trials of oral medications.  Informed consent was obtained after discussing risks and benefits of the procedure with the patient this included bleeding bruising infection as well as facial drooping, eyelid lag, systemic effects of Botox. Patient elects to proceed and has given written consent.  Patient placed in a seated position Dilution 50 units per cc  Muscles injected with dose  Procerus 5 units Corrugator 5 units bilateral Frontalis 5 units 2 injection sites on the right and 2 injection sites on the left Temporalis 5 units in 4 injection sites on the right and 4 injection sites on the left Occipitalis 5 units into 3 injections sites on the right and 3 injection sites on the left  Cervical paraspinals 5 units into 2 injection sites on the right and 2 injection sites on the left trapezius 5 units into 3 injection sites on the right and 3 injection sites on the left Right levator scapula, 15 units Patient tolerated procedure well. Post procedure instructions given. Appointment for repeat injection in 3 months   

## 2016-07-28 NOTE — Patient Instructions (Signed)

## 2016-08-05 DIAGNOSIS — M545 Low back pain: Secondary | ICD-10-CM | POA: Diagnosis not present

## 2016-08-05 DIAGNOSIS — M9904 Segmental and somatic dysfunction of sacral region: Secondary | ICD-10-CM | POA: Diagnosis not present

## 2016-08-05 DIAGNOSIS — M9905 Segmental and somatic dysfunction of pelvic region: Secondary | ICD-10-CM | POA: Diagnosis not present

## 2016-08-05 DIAGNOSIS — M9903 Segmental and somatic dysfunction of lumbar region: Secondary | ICD-10-CM | POA: Diagnosis not present

## 2016-08-11 DIAGNOSIS — M2142 Flat foot [pes planus] (acquired), left foot: Secondary | ICD-10-CM | POA: Diagnosis not present

## 2016-08-11 DIAGNOSIS — M2141 Flat foot [pes planus] (acquired), right foot: Secondary | ICD-10-CM | POA: Diagnosis not present

## 2016-08-18 MED FILL — diazePAM 5 MG TABS: 5 | 30 days supply | Qty: 60 | Fill #2

## 2016-08-18 MED FILL — predniSONE 10 MG TABS: 10 | 7 days supply | Qty: 7 | Fill #0

## 2016-08-18 MED FILL — levoFLOXacin 500 MG TABS: 500 | 7 days supply | Qty: 7 | Fill #0

## 2016-08-21 MED FILL — ZOLPIDEM TARTRATE 10 MG TAB: 10 | 90 days supply | Qty: 90 | Fill #0

## 2016-08-21 MED FILL — RIZATRIPTAN 5 MG ODT: 5 | 30 days supply | Qty: 9 | Fill #3

## 2016-08-21 MED FILL — AMOXICILLIN 500 MG CAPSULE: 500 | 5 days supply | Qty: 20 | Fill #3

## 2016-08-22 MED FILL — LIDOCAINE 5% PATCH: 5 | 90 days supply | Qty: 180 | Fill #0

## 2016-09-25 DIAGNOSIS — M1812 Unilateral primary osteoarthritis of first carpometacarpal joint, left hand: Secondary | ICD-10-CM | POA: Diagnosis not present

## 2016-09-25 DIAGNOSIS — M25532 Pain in left wrist: Secondary | ICD-10-CM | POA: Diagnosis not present

## 2016-09-25 DIAGNOSIS — M1712 Unilateral primary osteoarthritis, left knee: Secondary | ICD-10-CM | POA: Diagnosis not present

## 2016-09-25 DIAGNOSIS — M25531 Pain in right wrist: Secondary | ICD-10-CM | POA: Diagnosis not present

## 2016-10-01 ENCOUNTER — Ambulatory Visit

## 2016-10-09 ENCOUNTER — Ambulatory Visit (INDEPENDENT_AMBULATORY_CARE_PROVIDER_SITE_OTHER): Payer: 59 | Admitting: *Deleted

## 2016-10-09 DIAGNOSIS — Z23 Encounter for immunization: Secondary | ICD-10-CM | POA: Diagnosis not present

## 2016-10-09 DIAGNOSIS — Z9189 Other specified personal risk factors, not elsewhere classified: Secondary | ICD-10-CM

## 2016-10-09 DIAGNOSIS — Z0000 Encounter for general adult medical examination without abnormal findings: Secondary | ICD-10-CM | POA: Diagnosis not present

## 2016-10-09 DIAGNOSIS — Z789 Other specified health status: Secondary | ICD-10-CM | POA: Diagnosis not present

## 2016-10-09 DIAGNOSIS — Z7189 Other specified counseling: Secondary | ICD-10-CM

## 2016-10-09 DIAGNOSIS — Z2911 Encounter for prophylactic immunotherapy for respiratory syncytial virus (RSV): Secondary | ICD-10-CM

## 2016-10-09 DIAGNOSIS — Z7184 Encounter for health counseling related to travel: Secondary | ICD-10-CM

## 2016-10-09 MED FILL — TAMSULOSIN HCL 0.4 MG CAP: 0.4 | 90 days supply | Qty: 180 | Fill #2

## 2016-10-09 MED FILL — CYCLOBENZAPRINE 10 MG TAB: 10 | 90 days supply | Qty: 270 | Fill #1

## 2016-10-09 MED FILL — KETOCONAZOLE 2% CREAM: 2 | 20 days supply | Qty: 30 | Fill #1

## 2016-10-09 MED FILL — RIZATRIPTAN 5 MG ODT: 5 | 30 days supply | Qty: 9 | Fill #4

## 2016-10-09 MED FILL — CELECOXIB 200 MG CAPSULE: 200 | 90 days supply | Qty: 90 | Fill #2

## 2016-10-09 MED FILL — BUTALBITAL/APAP/CAFFEINE TB: 50-325-40 | 10 days supply | Qty: 60 | Fill #4

## 2016-10-09 MED FILL — diazePAM 5 MG TABS: 5 | 30 days supply | Qty: 60 | Fill #3

## 2016-10-09 MED FILL — CIALIS 5 MG TABLET: 5 | 90 days supply | Qty: 90 | Fill #0

## 2016-10-09 MED FILL — metFORMIN HCL 1000 MG TABS: 1000 | 30 days supply | Qty: 60 | Fill #5

## 2016-10-09 MED FILL — BACLOFEN 10 MG TABLET: 10 | 90 days supply | Qty: 360 | Fill #1

## 2016-10-10 MED FILL — traMADol HCL 50 MG TABS: 50 | 30 days supply | Qty: 240 | Fill #0

## 2016-10-20 DIAGNOSIS — M545 Low back pain: Secondary | ICD-10-CM | POA: Diagnosis not present

## 2016-10-20 DIAGNOSIS — M546 Pain in thoracic spine: Secondary | ICD-10-CM | POA: Diagnosis not present

## 2016-10-20 DIAGNOSIS — M9903 Segmental and somatic dysfunction of lumbar region: Secondary | ICD-10-CM | POA: Diagnosis not present

## 2016-10-20 DIAGNOSIS — M9904 Segmental and somatic dysfunction of sacral region: Secondary | ICD-10-CM | POA: Diagnosis not present

## 2016-10-20 MED FILL — AMOXICILLIN 500 MG CAPSULE: 500 | 5 days supply | Qty: 20 | Fill #4

## 2016-10-22 DIAGNOSIS — M9903 Segmental and somatic dysfunction of lumbar region: Secondary | ICD-10-CM | POA: Diagnosis not present

## 2016-10-22 DIAGNOSIS — M0000 Staphylococcal arthritis, unspecified joint: Secondary | ICD-10-CM | POA: Diagnosis not present

## 2016-10-22 DIAGNOSIS — M545 Low back pain: Secondary | ICD-10-CM | POA: Diagnosis not present

## 2016-10-22 DIAGNOSIS — M9904 Segmental and somatic dysfunction of sacral region: Secondary | ICD-10-CM | POA: Diagnosis not present

## 2016-10-23 MED FILL — ATOVAQUONE-PROGUANIL 250-10: 250-100 | 28 days supply | Qty: 28 | Fill #0

## 2016-10-23 MED FILL — AZITHROMYCIN 500 MG TABLET: 500 | 2 days supply | Qty: 4 | Fill #0

## 2016-10-28 ENCOUNTER — Encounter: Payer: 59 | Attending: Physical Medicine & Rehabilitation

## 2016-10-28 ENCOUNTER — Encounter: Payer: Self-pay | Admitting: Physical Medicine & Rehabilitation

## 2016-10-28 ENCOUNTER — Ambulatory Visit (HOSPITAL_BASED_OUTPATIENT_CLINIC_OR_DEPARTMENT_OTHER): Payer: 59 | Admitting: Physical Medicine & Rehabilitation

## 2016-10-28 VITALS — BP 128/87 | HR 98 | Resp 14

## 2016-10-28 DIAGNOSIS — G43809 Other migraine, not intractable, without status migrainosus: Secondary | ICD-10-CM | POA: Insufficient documentation

## 2016-10-28 DIAGNOSIS — M542 Cervicalgia: Secondary | ICD-10-CM | POA: Diagnosis present

## 2016-10-28 DIAGNOSIS — G43719 Chronic migraine without aura, intractable, without status migrainosus: Secondary | ICD-10-CM

## 2016-10-28 DIAGNOSIS — G8929 Other chronic pain: Secondary | ICD-10-CM | POA: Insufficient documentation

## 2016-10-28 DIAGNOSIS — M5127 Other intervertebral disc displacement, lumbosacral region: Secondary | ICD-10-CM | POA: Insufficient documentation

## 2016-10-28 DIAGNOSIS — M50322 Other cervical disc degeneration at C5-C6 level: Secondary | ICD-10-CM | POA: Diagnosis not present

## 2016-10-28 NOTE — Patient Instructions (Signed)
You received a botulinum toxin injection for chronic headache prevention. You may have soreness in your injection site, you may use ice for 20-30 minutes every 2 hours to help with soreness.  Maximum effect of the injection will be at around 1 week. Other side effects include possible muscle weakness which will subside as the medication wears off.  Medication may be repeated every 3 months as needed. 

## 2016-10-28 NOTE — Progress Notes (Signed)
Botox injection for chronic migraine prophylaxis.  Indication: History of migraines with greater than 15 headaches per month despite trials of oral medications.  Informed consent was obtained after discussing risks and benefits of the procedure with the patient this included bleeding bruising infection as well as facial drooping, eyelid lag, systemic effects of Botox. Patient elects to proceed and has given written consent.  Patient placed in a seated position Dilution 50 units per cc  Muscles injected with dose  Procerus 5 units Corrugator 5 units bilateral Frontalis 5 units 2 injection sites on the right and 2 injection sites on the left Temporalis 5 units in 4 injection sites on the right and 4 injection sites on the left Occipitalis 5 units into 3 injections sites on the right and 3 injection sites on the left  Cervical paraspinals 5 units into 2 injection sites on the right and 2 injection sites on the left trapezius 5 units into 3 injection sites on the right and 3 injection sites on the left Right levator scapula, 15 units Patient tolerated procedure well. Post procedure instructions given. Appointment for repeat injection in 3 months   

## 2016-11-17 DIAGNOSIS — M9904 Segmental and somatic dysfunction of sacral region: Secondary | ICD-10-CM | POA: Diagnosis not present

## 2016-11-17 DIAGNOSIS — M545 Low back pain: Secondary | ICD-10-CM | POA: Diagnosis not present

## 2016-11-17 DIAGNOSIS — M546 Pain in thoracic spine: Secondary | ICD-10-CM | POA: Diagnosis not present

## 2016-11-17 DIAGNOSIS — M9903 Segmental and somatic dysfunction of lumbar region: Secondary | ICD-10-CM | POA: Diagnosis not present

## 2016-11-17 MED FILL — AMOXICILLIN 500 MG CAPSULE: 500 | 4 days supply | Qty: 16 | Fill #0

## 2016-11-17 MED FILL — RIZATRIPTAN 5 MG ODT: 5 | 30 days supply | Qty: 9 | Fill #5

## 2016-11-17 MED FILL — PRAVASTATIN NA 40 MG TAB: 40 | 90 days supply | Qty: 90 | Fill #0

## 2016-11-17 MED FILL — TOPIRAMATE 25 MG TABLET: 25 | 90 days supply | Qty: 90 | Fill #1

## 2016-11-18 MED FILL — BUTALBITAL/APAP/CAFFEINE TB: 50-325-40 | 10 days supply | Qty: 60 | Fill #0

## 2016-11-21 ENCOUNTER — Ambulatory Visit: Payer: Self-pay | Admitting: Orthopedic Surgery

## 2016-11-21 MED FILL — diazePAM 5 MG TABS: 5 | 30 days supply | Qty: 60 | Fill #0

## 2016-11-26 NOTE — Patient Instructions (Addendum)
[name], Dr.  11/26/2016   Your procedure is scheduled on: 12/08/2016    Report to [facility] Main  Entrance take [place]  elevators to 3rd floor to  [place] at    [place] AM.  Call this number if you have problems the morning of surgery [phone]   Remember: ONLY 1 PERSON MAY GO WITH YOU TO SHORT STAY TO GET  READY MORNING OF [place].  Do not eat food or drink liquids :After Midnight.     Take these medicines the morning of surgery with A SIP OF WATER: Diazepam ( Valium), Flonase as needed, Oxycodone as meeded , tamsulosin(flomax), topiramate(topamax)  DO NOT TAKE ANY DIABETIC MEDICATIONS DAY OF YOUR SURGERY                               You may not have any metal on your body including hair pins and              piercings  Do not wear jewelry, , lotions, powders or perfumes, deodorant                         Men may shave face and neck.   Do not bring valuables to the hospital. [place].  Contacts, dentures or bridgework may not be worn into surgery.  Leave suitcase in the car. After surgery it may be brought to your room.    Please read over the following fact sheets you were given: _____________________________________________________________________  How to Manage Your Diabetes Before and After Surgery  Why is it important to control my blood sugar before and after surgery? . Improving blood sugar levels before and after surgery helps healing and can limit problems. . A way of improving blood sugar control is eating a healthy diet by: o  Eating less sugar and carbohydrates o  Increasing activity/exercise o  Talking with your doctor about reaching your blood sugar goals . High blood sugars (greater than 180 mg/dL) can raise your risk of infections and slow your recovery, so you will need to focus on controlling your diabetes during the weeks before surgery. . Make sure that the  doctor who takes care of your diabetes knows about your planned surgery including the date and location.  How do I manage my blood sugar before surgery? . Check your blood sugar at least 4 times a day, starting 2 days before surgery, to make sure that the level is not too high or low. o Check your blood sugar the morning of your surgery when you wake up and every 2 hours until you get to the Short Stay unit. . If your blood sugar is less than 70 mg/dL, you will need to treat for low blood sugar: o Do not take insulin. o Treat a low blood sugar (less than 70 mg/dL) with  cup of clear juice (cranberry or apple), 4 glucose tablets, OR glucose gel. o Recheck blood sugar in 15 minutes after treatment (to make sure it is greater than 70 mg/dL). If your blood sugar is not greater than 70 mg/dL on recheck, call [phone] for further instructions. . Report your blood sugar to the short stay nurse when you  get to Short Stay.  . If you are admitted to the hospital after surgery: o Your blood sugar will be checked by the staff and you will probably be given insulin after surgery (instead of oral diabetes medicines) to make sure you have good blood sugar levels. o The goal for blood sugar control after surgery is 80-180 mg/dL.   WHAT DO I DO ABOUT MY DIABETES MEDICATION?  [name] Do not take oral diabetes medicines (pills) the morning of surgery.  . THE DAY/NIGHT BEFORE SURGERY, take    Your Metformin as usual     . THE MORNING OF SURGERY, DO NOT TAKE YOUR METFORMIN    Reviewed and Endorsed by [facility] Patient Education Committee, August 2015           [facility] - Preparing for Surgery Before surgery, you can play an important role.  Because skin is not sterile, your skin needs to be as free of germs as possible.  You can reduce the number of germs on your skin by washing with CHG (chlorahexidine gluconate) soap before surgery.  CHG is an antiseptic cleaner which kills germs and bonds with the  skin to continue killing germs even after washing. Please DO NOT use if you have an allergy to CHG or antibacterial soaps.  If your skin becomes reddened/irritated stop using the CHG and inform your nurse when you arrive at Short Stay. Do not shave (including legs and underarms) for at least 48 hours prior to the first CHG shower.  You may shave your face/neck. Please follow these instructions carefully:  1.  Shower with CHG Soap the night before surgery and the  morning of Surgery.  2.  If you choose to wash your hair, wash your hair first as usual with your  normal  shampoo.  3.  After you shampoo, rinse your hair and body thoroughly to remove the  shampoo.                           4.  Use CHG as you would any other liquid soap.  You can apply chg directly  to the skin and wash                       Gently with a scrungie or clean washcloth.  5.  Apply the CHG Soap to your body ONLY FROM THE NECK DOWN.   Do not use on face/ open                           Wound or open sores. Avoid contact with eyes, ears mouth and genitals (private parts).                       Wash face,  Genitals (private parts) with your normal soap.             6.  Wash thoroughly, paying special attention to the area where your surgery  will be performed.  7.  Thoroughly rinse your body with warm water from the neck down.  8.  DO NOT shower/wash with your normal soap after using and rinsing off  the CHG Soap.                9.  Pat yourself dry with a clean towel.            10.  Wear  clean pajamas.            11.  Place clean sheets on your bed the night of your first shower and do not  sleep with pets. Day of Surgery : Do not apply any lotions/deodorants the morning of surgery.  Please wear clean clothes to the hospital/surgery center.  FAILURE TO FOLLOW THESE INSTRUCTIONS MAY RESULT IN THE CANCELLATION OF YOUR SURGERY PATIENT SIGNATURE_________________________________  NURSE  SIGNATURE__________________________________  ________________________________________________________________________  WHAT IS A BLOOD TRANSFUSION? Blood Transfusion Information  A transfusion is the replacement of blood or some of its parts. Blood is made up of multiple cells which provide different functions.  Red blood cells carry oxygen and are used for blood loss replacement.  White blood cells fight against infection.  Platelets control bleeding.  Plasma helps clot blood.  Other blood products are available for specialized needs, such as hemophilia or other clotting disorders. BEFORE THE TRANSFUSION  Who gives blood for transfusions?   Healthy volunteers who are fully evaluated to make sure their blood is safe. This is blood bank blood. Transfusion therapy is the safest it has ever been in the practice of medicine. Before blood is taken from a donor, a complete history is taken to make sure that person has no history of diseases nor engages in risky social behavior (examples are intravenous drug use or sexual activity with multiple partners). The donor's travel history is screened to minimize risk of transmitting infections, such as malaria. The donated blood is tested for signs of infectious diseases, such as HIV and hepatitis. The blood is then tested to be sure it is compatible with you in order to minimize the chance of a transfusion reaction. If you or a relative donates blood, this is often done in anticipation of surgery and is not appropriate for emergency situations. It takes many days to process the donated blood. RISKS AND COMPLICATIONS Although transfusion therapy is very safe and saves many lives, the main dangers of transfusion include:   Getting an infectious disease.  Developing a transfusion reaction. This is an allergic reaction to something in the blood you were given. Every precaution is taken to prevent this. The decision to have a blood transfusion has been  considered carefully by your caregiver before blood is given. Blood is not given unless the benefits outweigh the risks. AFTER THE TRANSFUSION  Right after receiving a blood transfusion, you will usually feel much better and more energetic. This is especially true if your red blood cells have gotten low (anemic). The transfusion raises the level of the red blood cells which carry oxygen, and this usually causes an energy increase.  The nurse administering the transfusion will monitor you carefully for complications. HOME CARE INSTRUCTIONS  No special instructions are needed after a transfusion. You may find your energy is better. Speak with your caregiver about any limitations on activity for underlying diseases you may have. SEEK MEDICAL CARE IF:   Your condition is not improving after your transfusion.  You develop redness or irritation at the intravenous (IV) site. SEEK IMMEDIATE MEDICAL CARE IF:  Any of the following symptoms occur over the next 12 hours:  Shaking chills.  You have a temperature by mouth above 102 F (38.9 C), not controlled by medicine.  Chest, back, or muscle pain.  People around you feel you are not acting correctly or are confused.  Shortness of breath or difficulty breathing.  Dizziness and fainting.  You get a rash or develop hives.  You  have a decrease in urine output.  Your urine turns a dark color or changes to pink, red, or brown. Any of the following symptoms occur over the next 10 days:  You have a temperature by mouth above 102 F (38.9 C), not controlled by medicine.  Shortness of breath.  Weakness after normal activity.  The white part of the eye turns yellow (jaundice).  You have a decrease in the amount of urine or are urinating less often.  Your urine turns a dark color or changes to pink, red, or brown. Document Released: 09/26/2000 Document Revised: 12/22/2011 Document Reviewed: 05/15/2008 ExitCare Patient Information 2014  [place].  _______________________________________________________________________  Incentive Spirometer  An incentive spirometer is a tool that can help keep your lungs clear and active. This tool measures how well you are filling your lungs with each breath. Taking long deep breaths may help reverse or decrease the chance of developing breathing (pulmonary) problems (especially infection) following:  A long period of time when you are unable to move or be active. BEFORE THE PROCEDURE   If the spirometer includes an indicator to show your best effort, your nurse or respiratory therapist will set it to a desired goal.  If possible, sit up straight or lean slightly forward. Try not to slouch.  Hold the incentive spirometer in an upright position. INSTRUCTIONS FOR USE  1. Sit on the edge of your bed if possible, or sit up as far as you can in bed or on a chair. 2. Hold the incentive spirometer in an upright position. 3. Breathe out normally. 4. Place the mouthpiece in your mouth and seal your lips tightly around it. 5. Breathe in slowly and as deeply as possible, raising the piston or the ball toward the top of the column. 6. Hold your breath for 3-5 seconds or for as long as possible. Allow the piston or ball to fall to the bottom of the column. 7. Remove the mouthpiece from your mouth and breathe out normally. 8. Rest for a few seconds and repeat Steps 1 through 7 at least 10 times every 1-2 hours when you are awake. Take your time and take a few normal breaths between deep breaths. 9. The spirometer may include an indicator to show your best effort. Use the indicator as a goal to work toward during each repetition. 10. After each set of 10 deep breaths, practice coughing to be sure your lungs are clear. If you have an incision (the cut made at the time of surgery), support your incision when coughing by placing a pillow or rolled up towels firmly against it. Once you are able to get  out of bed, walk around indoors and cough well. You may stop using the incentive spirometer when instructed by your caregiver.  RISKS AND COMPLICATIONS  Take your time so you do not get dizzy or light-headed.  If you are in pain, you may need to take or ask for pain medication before doing incentive spirometry. It is harder to take a deep breath if you are having pain. AFTER USE  Rest and breathe slowly and easily.  It can be helpful to keep track of a log of your progress. Your caregiver can provide you with a simple table to help with this. If you are using the spirometer at home, follow these instructions: [place] IF:   You are having difficultly using the spirometer.  You have trouble using the spirometer as often as instructed.  Your pain medication  is not giving enough relief while using the spirometer.  You develop fever of 100.5 F (38.1 C) or higher. SEEK IMMEDIATE MEDICAL CARE IF:   You cough up bloody sputum that had not been present before.  You develop fever of 102 F (38.9 C) or greater.  You develop worsening pain at or near the incision site. MAKE SURE YOU:   Understand these instructions.  Will watch your condition.  Will get help right away if you are not doing well or get worse. Document Released: 02/09/2007 Document Revised: 12/22/2011 Document Reviewed: 04/12/2007 [facility] Patient Information 2014 [place], [place].   ________________________________________________________________________

## 2016-11-27 ENCOUNTER — Other Ambulatory Visit (HOSPITAL_COMMUNITY): Payer: Self-pay | Admitting: Emergency Medicine

## 2016-11-28 ENCOUNTER — Encounter (INDEPENDENT_AMBULATORY_CARE_PROVIDER_SITE_OTHER): Payer: Self-pay

## 2016-11-28 ENCOUNTER — Encounter (HOSPITAL_COMMUNITY)
Admission: RE | Admit: 2016-11-28 | Discharge: 2016-11-28 | Disposition: A | Discharge status: Home / Self Care | Payer: 59 | Source: Ambulatory Visit | Attending: Orthopedic Surgery | Admitting: Orthopedic Surgery

## 2016-11-28 ENCOUNTER — Ambulatory Visit (HOSPITAL_COMMUNITY)
Admission: RE | Admit: 2016-11-28 | Discharge: 2016-11-28 | Disposition: A | Discharge status: Home / Self Care | Payer: 59 | Source: Ambulatory Visit | Attending: Anesthesiology | Admitting: Anesthesiology

## 2016-11-28 ENCOUNTER — Encounter (HOSPITAL_COMMUNITY): Payer: Self-pay

## 2016-11-28 ENCOUNTER — Ambulatory Visit: Payer: Self-pay | Admitting: Orthopedic Surgery

## 2016-11-28 ENCOUNTER — Other Ambulatory Visit: Payer: Self-pay

## 2016-11-28 DIAGNOSIS — Z01810 Encounter for preprocedural cardiovascular examination: Secondary | ICD-10-CM | POA: Diagnosis not present

## 2016-11-28 DIAGNOSIS — M1712 Unilateral primary osteoarthritis, left knee: Secondary | ICD-10-CM | POA: Insufficient documentation

## 2016-11-28 DIAGNOSIS — R0989 Other specified symptoms and signs involving the circulatory and respiratory systems: Secondary | ICD-10-CM | POA: Diagnosis not present

## 2016-11-28 DIAGNOSIS — Z01818 Encounter for other preprocedural examination: Secondary | ICD-10-CM | POA: Diagnosis not present

## 2016-11-28 DIAGNOSIS — E119 Type 2 diabetes mellitus without complications: Secondary | ICD-10-CM | POA: Insufficient documentation

## 2016-11-28 DIAGNOSIS — Z01812 Encounter for preprocedural laboratory examination: Secondary | ICD-10-CM | POA: Diagnosis not present

## 2016-11-28 HISTORY — DX: Pneumonia, unspecified organism: J18.9

## 2016-11-28 HISTORY — DX: Unspecified osteoarthritis, unspecified site: M19.90

## 2016-11-28 HISTORY — DX: Other cervical disc degeneration, unspecified cervical region: M50.30

## 2016-11-28 HISTORY — DX: Benign prostatic hyperplasia without lower urinary tract symptoms: N40.0

## 2016-11-28 LAB — CBC
HCT: 42.7 % (ref 39.0–52.0)
Hemoglobin: 14.5 g/dL (ref 13.0–17.0)
MCH: 30.5 pg (ref 26.0–34.0)
MCHC: 34 g/dL (ref 30.0–36.0)
MCV: 89.7 fL (ref 78.0–100.0)
Platelets: 314 10*3/uL (ref 150–400)
RBC: 4.76 MIL/uL (ref 4.22–5.81)
RDW: 13.7 % (ref 11.5–15.5)
WBC: 14.1 10*3/uL — ABNORMAL HIGH (ref 4.0–10.5)

## 2016-11-28 LAB — COMPREHENSIVE METABOLIC PANEL
ALT: 25 U/L (ref 17–63)
AST: 26 U/L (ref 15–41)
Albumin: 4.1 g/dL (ref 3.5–5.0)
Alkaline Phosphatase: 61 U/L (ref 38–126)
Anion gap: 5 (ref 5–15)
BUN: 14 mg/dL (ref 6–20)
CO2: 29 mmol/L (ref 22–32)
Calcium: 9.2 mg/dL (ref 8.9–10.3)
Chloride: 107 mmol/L (ref 101–111)
Creatinine, Ser: 1.13 mg/dL (ref 0.61–1.24)
GFR calc Af Amer: >60 mL/min (ref >60)
GFR calc non Af Amer: >60 mL/min (ref >60)
Glucose, Bld: 105 mg/dL — ABNORMAL HIGH (ref 65–99)
Potassium: 4.1 mmol/L (ref 3.5–5.1)
Sodium: 141 mmol/L (ref 135–145)
Total Bilirubin: 0.8 mg/dL (ref 0.3–1.2)
Total Protein: 7.2 g/dL (ref 6.5–8.1)

## 2016-11-28 LAB — SURGICAL PCR SCREEN
MRSA, PCR: NEGATIVE
Staphylococcus aureus: NEGATIVE

## 2016-11-28 LAB — GLUCOSE, CAPILLARY: Glucose-Capillary: 101 mg/dL — ABNORMAL HIGH (ref 65–99)

## 2016-11-28 MED FILL — OSELTAMIVIR PHOSPHATE 75 MG: 75 | 5 days supply | Qty: 10 | Fill #0

## 2016-11-28 MED FILL — AZITHROMYCIN 250 MG TABLET: 250 | 5 days supply | Qty: 6 | Fill #0

## 2016-11-28 NOTE — Progress Notes (Signed)
CBC results routed via epic to Dr Wynelle Link

## 2016-11-28 NOTE — Patient Outreach (Signed)
[facility] [facility]) Care Management  11/28/2016  [name], MD October 31, 1960 [id number]   Subjective: "I am in the nurse's office right now".  Objective: Per chart review, Client is scheduled for Left total knee arthroplasty on 12/08/16.  Assessment:  Received [facility] Pre-surgical call referral on 11/19/16. Call placed. Two patient identifier's confirmed. Client is in the office with the nurse at this time. RNCM informed client of the nature of the call.   Plan: Client to return call. RNCM contact information provided. RNCM will follow up within the next week if no return call.   Thea Silversmith, RN, MSN, [place] Coordinator Cell: [phone]

## 2016-11-28 NOTE — Progress Notes (Signed)
   11/28/16 1420  OBSTRUCTIVE SLEEP APNEA  Have you ever been diagnosed with sleep apnea through a sleep study? No  Do you snore loudly (loud enough to be heard through closed doors)?  0  Do you often feel tired, fatigued, or sleepy during the daytime (such as falling asleep during driving or talking to someone)? 1  Has anyone observed you stop breathing during your sleep? 0  Do you have, or are you being treated for high blood pressure? 0  BMI more than 35 kg/m2? 1  Age > 50 (1-yes) 1  Neck circumference greater than:Male 16 inches or larger, Male 17inches or larger? 1  Male Gender (Yes=1) 1  Obstructive Sleep Apnea Score 5

## 2016-11-29 LAB — ABO/RH: ABO/RH(D): O NEG

## 2016-12-01 ENCOUNTER — Encounter (HOSPITAL_COMMUNITY): Payer: Self-pay | Admitting: *Deleted

## 2016-12-01 ENCOUNTER — Other Ambulatory Visit: Payer: Self-pay

## 2016-12-01 ENCOUNTER — Emergency Department (HOSPITAL_COMMUNITY)
Admission: EM | Admit: 2016-12-01 | Discharge: 2016-12-01 | Disposition: A | Discharge status: Home / Self Care | Payer: 59 | Attending: Emergency Medicine | Admitting: Emergency Medicine

## 2016-12-01 ENCOUNTER — Emergency Department (HOSPITAL_COMMUNITY): Payer: 59

## 2016-12-01 DIAGNOSIS — J069 Acute upper respiratory infection, unspecified: Secondary | ICD-10-CM | POA: Diagnosis not present

## 2016-12-01 DIAGNOSIS — E119 Type 2 diabetes mellitus without complications: Secondary | ICD-10-CM | POA: Insufficient documentation

## 2016-12-01 DIAGNOSIS — R079 Chest pain, unspecified: Secondary | ICD-10-CM | POA: Diagnosis not present

## 2016-12-01 DIAGNOSIS — Z7984 Long term (current) use of oral hypoglycemic drugs: Secondary | ICD-10-CM | POA: Insufficient documentation

## 2016-12-01 DIAGNOSIS — J0101 Acute recurrent maxillary sinusitis: Secondary | ICD-10-CM | POA: Diagnosis not present

## 2016-12-01 DIAGNOSIS — R05 Cough: Secondary | ICD-10-CM | POA: Diagnosis not present

## 2016-12-01 DIAGNOSIS — R0602 Shortness of breath: Secondary | ICD-10-CM | POA: Diagnosis not present

## 2016-12-01 MED ORDER — GUAIFENESIN 100 MG/5ML PO SYRP: 100.0000 mg | ORAL_SOLUTION | ORAL | 0 refills | Status: DC | PRN

## 2016-12-01 MED ORDER — KETOROLAC TROMETHAMINE 30 MG/ML IJ SOLN
15.0000 mg | Freq: Once | INTRAMUSCULAR | Status: AC
Start: 2016-12-01 — End: 2016-12-01
  Administered 2016-12-01: 15 mg via INTRAVENOUS
  Filled 2016-12-01: qty 1

## 2016-12-01 MED ORDER — AMOXICILLIN-POT CLAVULANATE 875-125 MG PO TABS: 1.0000 | ORAL_TABLET | Freq: Two times a day (BID) | ORAL | 0 refills | Status: DC

## 2016-12-01 MED ORDER — AMOXICILLIN-POT CLAVULANATE 875-125 MG PO TABS
1.0000 | ORAL_TABLET | Freq: Once | ORAL | Status: AC
  Administered 2016-12-01: 1 via ORAL
  Filled 2016-12-01: qty 1

## 2016-12-01 MED ORDER — SODIUM CHLORIDE 0.9 % IV BOLUS (SEPSIS)
1000.0000 mL | Freq: Once | INTRAVENOUS | Status: AC
  Administered 2016-12-01: 1000 mL via INTRAVENOUS

## 2016-12-01 MED FILL — levoFLOXacin 500 MG TABS: 500 | 7 days supply | Qty: 7 | Fill #0

## 2016-12-01 MED FILL — AMOX TR-K CLV 875-125 MG TA: 875-125 | 7 days supply | Qty: 14 | Fill #0

## 2016-12-01 NOTE — Patient Outreach (Signed)
[facility]) Care Management  12/01/2016  [name], MD [birth year] [id number]   Subjective: Telephone call to patient. Discussed [facility] pre-op follow up. Patient voices understanding and agrees to pre-op call.    Objective: per chart review- Patient to be admitted on 12/08/16 for Left Total Knee Arthroplasty.  Assessment: received [facility] pre-op call referral on 11/19/16. Pre op call completed. 56 year old with history of Chronic Migraines.    Client reports that FMLA forms are completed and he has received confirmation. RNCM recommend obtaining a copy of completed FMLA forms for his records. Client reports he does not have short term disability, but has questions concerning long term disability process. Client unable to write number down at this time-request that RNCM will call back and leave benefits number on his voice mail. Client reports he has the [place] and needs to activate to receive this benefit. RNCM encouraged client to follow up with benefits office regarding how to access this benefit also.   Post procedure equipment/home health-Client reports rehabilitation will be determined post procedure per physical therapy. RNCM reinforced that equipment will be arranged per inpatient case manager/home health if needed will be arranged per inpatient case manager.   RNCM discussed [place] benefit is higher when using a [facility] facility/pharmacy.  Client verbalized understanding and states a prescription for Rehabilitation was sent this past Friday to the [place] [facility].  RNCM reinforced closing times of [facility] pharmacies. Client reports he should be discharged in time for any new prescriptions to be obtained.   Support: Client reports he has transportation to his follow up appointment. He also reports he has someone to obtain new prescriptions for him.   Discussed Advanced Directives. RNCM reinforced that Renaldo Harrison with the Spiritual care department [phone] is available to assist [employer] employees with Advanced Directives as needed.  No other medical issues identified and no additional community resource information needs at this time.   Patient is agreeable to follow up post procedure call.  RNCM called and left the Benefits Service contact number on client's voicemail.  Plan: telephonic RNCM will follow up within 3 business days of notification.  Thea Silversmith, RN, MSN, [place] Coordinator Cell: [phone]

## 2016-12-01 NOTE — ED Triage Notes (Signed)
Pt is being tx for sinus infection.  States has taken z-pack and tamiflu and continues to have fevers of 101.  HR of 124 with temp of 99.

## 2016-12-01 NOTE — ED Provider Notes (Signed)
[facility] DEPT Provider Note   CSN: [id number] Arrival date & time: 12/01/16  0805     History   Chief Complaint Chief Complaint  Patient presents with  . 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   URI   This is a new problem. The current episode started more than 2 days ago. The problem has not changed since onset.There has been no fever. The fever has been present for less than 1 day. Associated symptoms include congestion, plugged ear sensation, swollen glands and cough. Pertinent negatives include no chest pain. He has tried NSAIDs for the symptoms. The treatment provided no relief.    Past Medical History:  Diagnosis Date  . Arthritis   . BPH (benign prostatic hyperplasia)   . DDD (degenerative disc disease), cervical   . Diabetes mellitus without complication ([facility] [place])    type 2  . DJD (degenerative joint disease)   . Headache    migraines  . Pneumonia     Patient Active Problem List   Diagnosis Date Noted  . 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 Cervical facet joint syndrome 12/17/2015  . Chronic bilateral low back pain without sciatica 12/17/2015  . Primary osteoarthritis of left knee 12/17/2015    Past Surgical History:  Procedure Laterality Date  . Hip Resurface  2006  . MENISCUS REPAIR  2008, 1993  . SLAP Tear Repair  2008  . TONSILLECTOMY         Home Medications    Prior to Admission medications   Medication Sig Start Date End Date Taking? Authorizing Provider  azithromycin (ZITHROMAX) 500 MG tablet Take 250-500 mg by mouth See admin instructions. 2 tabs for day 1, then 4 days then after   Yes Historical Provider, MD  baclofen (LIORESAL) 10 MG tablet Take 10 mg by mouth. Take 1-2 tablets by mouth twice a day as needed.   Yes Historical Provider, MD  butalbital-acetaminophen-caffeine (FIORICET, ESGIC) 50-325-40 MG per tablet Take by mouth 2 (two) times daily as needed for headache.   Yes  Historical Provider, MD  CIALIS 5 MG tablet Take 5 mg by mouth daily. 10/09/16  Yes Historical Provider, MD  diazepam (VALIUM) 5 MG tablet Take 5 mg by mouth 2 (two) times daily.    Yes Historical Provider, MD  fluticasone (FLONASE) 50 MCG/ACT nasal spray Place into both nostrils daily.   Yes Historical Provider, MD  ketoconazole (NIZORAL) 2 % cream Apply 1 application topically 2 (two) times daily.   Yes Historical Provider, MD  lidocaine (LIDODERM) 5 % Place 1 patch onto the skin daily. Remove & Discard patch within 12 hours or as directed by MD   Yes Historical Provider, MD  metFORMIN (GLUCOPHAGE) 1000 MG tablet Take 1,000 mg by mouth 2 (two) times daily with a meal.   Yes Historical Provider, MD  methocarbamol (ROBAXIN) 500 MG tablet Take 500 mg by mouth 2 (two) times daily as needed for muscle spasms.   Yes Historical Provider, MD  oseltamivir (TAMIFLU) 75 MG capsule Take 75 mg by mouth 2 (two) times daily.   Yes Historical Provider, MD  OVER THE COUNTER MEDICATION Take 1 tablet by mouth daily. Hydroxycut. 1 tablet by mouth daily   Yes Historical Provider, MD  oxycodone (OXY-IR) 5 MG capsule Take 5 mg by mouth. Take 1-2 tablets by mouth every 8 hours as needed for pain.   Yes Historical Provider, MD  sodium chloride (OCEAN)  0.65 % SOLN nasal spray Place 1 spray into both nostrils as needed for congestion.   Yes Historical Provider, MD  SUMAtriptan (IMITREX) 50 MG tablet Take 50 mg by mouth every 2 (two) hours as needed for migraine or headache. May repeat in 2 hours if headache persists or recurs.   Yes Historical Provider, MD  tamsulosin (FLOMAX) 0.4 MG CAPS capsule Take 0.4 mg by mouth daily.   Yes Historical Provider, MD  topiramate (TOPAMAX) 25 MG tablet Take 25 mg by mouth daily.   Yes Historical Provider, MD  traMADol (ULTRAM) 50 MG tablet Take 50 mg by mouth 2 (two) times daily as needed for moderate pain.    Yes Historical Provider, MD  zolpidem (AMBIEN) 10 MG tablet Take 10 mg by mouth  at bedtime as needed for sleep.   Yes Historical Provider, MD  amoxicillin-clavulanate (AUGMENTIN) 875-125 MG tablet Take 1 tablet by mouth 2 (two) times daily. One po bid x 7 days 12/01/16   Merrily Pew, MD  celecoxib (CELEBREX) 200 MG capsule Take 200 mg by mouth daily.    Historical Provider, MD  guaifenesin (ROBITUSSIN) 100 MG/5ML syrup Take 5-10 mLs (100-200 mg total) by mouth every 4 (four) hours as needed for cough. 12/01/16   Merrily Pew, MD    Family History No family history on file.  Social History Social History  Substance Use Topics  . Smoking status: Never Smoker  . Smokeless tobacco: Never Used  . Alcohol use Yes     Comment: seldom     Allergies   Benzoin compound   Review of Systems Review of Systems  HENT: Positive for congestion.   Respiratory: Positive for cough.   Cardiovascular: Negative for chest pain.  All other systems reviewed and are negative.    Physical Exam Updated Vital Signs BP 122/80   Pulse 91   Temp 99.1 F (37.3 C) (Oral)   Resp 18   Ht 6' (1.829 m)   Wt 282 lb (127.9 kg)   SpO2 99%   BMI 38.25 kg/m   Physical Exam  Constitutional: He appears well-developed and well-nourished.  HENT:  Head: Normocephalic and atraumatic.  Right Ear: External ear normal. Tympanic membrane is injected, erythematous and bulging. A middle ear effusion is present.  Left Ear: External ear normal. Tympanic membrane is injected and erythematous. Tympanic membrane is not bulging. A middle ear effusion is present.  Eyes: Conjunctivae and EOM are normal.  Neck: Normal range of motion.  Cardiovascular: Normal rate.   Pulmonary/Chest: Effort normal. No respiratory distress. He has no wheezes.  Abdominal: He exhibits no distension.  Musculoskeletal: Normal range of motion.  Neurological: He is alert.  Nursing note and vitals reviewed.    ED Treatments / Results  Labs (all labs ordered are listed, but only abnormal results are displayed) Labs  Reviewed - No data to display  EKG  EKG Interpretation None       Radiology Dg Chest 2 View  Result Date: 12/01/2016 CLINICAL DATA:  Cough, chest congestion, chest pain, fever, and shortness of breath for the past 5 days. Persistent symptoms despite taking a Z-Pak and Tamiflu. Nonsmoker. EXAM: CHEST  2 VIEW COMPARISON:  Chest x-ray of November 28, 2016 FINDINGS: The lungs are adequately inflated and clear. The heart and pulmonary vascularity are normal. The mediastinum is normal in width. The trachea is midline. The bony thorax exhibits no acute abnormality. IMPRESSION: There is no pneumonia nor other acute cardiopulmonary abnormality. Electronically Signed   By:  David  Martinique M.D.   On: 12/01/2016 09:15    Procedures Procedures (including critical care time)  Medications Ordered in ED Medications  amoxicillin-clavulanate (AUGMENTIN) 875-125 MG per tablet 1 tablet (1 tablet Oral Given 12/01/16 0901)  sodium chloride 0.9 % bolus 1,000 mL (0 mLs Intravenous Stopped 12/01/16 1039)  ketorolac (TORADOL) 30 MG/ML injection 15 mg (15 mg Intravenous Given 12/01/16 0935)     Initial Impression / Assessment and Plan / ED Course  I have reviewed the triage vital signs and the nursing notes.  Pertinent labs & imaging results that were available during my care of the patient were reviewed by me and considered in my medical decision making (see chart for details).   Plan knee surgery next week and has been on azithromycin for sinusitis but hasn't seemed to be improving. Suspect this could very well be viral however he has had a history of bacterial sinusitis in the past and feels this is similar. Secondary to that reason I will start him on Augmentin for that. Had a little bit tachycardic initially but improved with fluids and antipyretics. Low suspicion for any other systemic cause of his symptoms. Follow-up with primary doctor as needed a few days if not improving.  Final Clinical Impressions(s) /  ED Diagnoses   Final diagnoses:  Upper respiratory tract infection, unspecified type  Acute recurrent maxillary sinusitis    New Prescriptions Discharge Medication List as of 12/01/2016 11:48 AM    START taking these medications   Details  amoxicillin-clavulanate (AUGMENTIN) 875-125 MG tablet Take 1 tablet by mouth 2 (two) times daily. One po bid x 7 days, Starting Mon 12/01/2016, Print    guaifenesin (ROBITUSSIN) 100 MG/5ML syrup Take 5-10 mLs (100-200 mg total) by mouth every 4 (four) hours as needed for cough., Starting Mon 12/01/2016, Print         Merrily Pew, MD 12/01/16 [phone]

## 2016-12-04 DIAGNOSIS — M545 Low back pain: Secondary | ICD-10-CM | POA: Diagnosis not present

## 2016-12-04 DIAGNOSIS — M9903 Segmental and somatic dysfunction of lumbar region: Secondary | ICD-10-CM | POA: Diagnosis not present

## 2016-12-04 DIAGNOSIS — M546 Pain in thoracic spine: Secondary | ICD-10-CM | POA: Diagnosis not present

## 2016-12-04 DIAGNOSIS — M9904 Segmental and somatic dysfunction of sacral region: Secondary | ICD-10-CM | POA: Diagnosis not present

## 2016-12-07 ENCOUNTER — Ambulatory Visit: Payer: Self-pay | Admitting: Orthopedic Surgery

## 2016-12-07 MED ORDER — DEXTROSE 5 % IV SOLN
3.0000 g | INTRAVENOUS | Status: AC
  Administered 2016-12-08: 3 g via INTRAVENOUS
  Filled 2016-12-07: qty 3

## 2016-12-07 NOTE — H&P (Signed)
Daniel Crawford MD DOB: [birth year] Married / Language: English / Race: Black or African American Male Date of Admission:  12/08/2016 CC:  Left Knee Pain History of Present Illness The patient is a 56 year old male who comes in for a preoperative History and Physical. The patient is scheduled for a left total knee arthroplasty to be performed by Dr. [name]. Aluisio, MD at [facility] on 12-08-2016. The patient is a 56 year old male who presented for follow up of their knee. The patient is being followed for their left knee pain and osteoarthritis. They are now months out from [name] series. Symptoms reported include: pain and swelling. The patient feels that they are doing poorly and report their pain level to be moderate to severe. The following medication has been used for pain control: antiinflammatory medication. The patient has reported only temporary improvement of their symptoms with: Cortisone injections and viscosupplementation. Unfortunately [name]' knee is getting progressively worse. It is now hurting him at all times. It is limiting what he can and cannot do. 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 Last series of viscosupplements did not do as well as it had in the past. Cortisone helps, but it is wearing in a very short amount of time. He is now at a stage where he is ready to proceed with knee replacement. They have been treated conservatively in the past for the above stated problem and despite conservative measures, they continue to have progressive pain and severe functional limitations and dysfunction. They have failed non-operative management including home exercise, medications, and injections. It is felt that they would benefit from undergoing total joint replacement. Risks and benefits of the procedure have been discussed with the patient and they elect to proceed with surgery. There are no active  contraindications to surgery such as ongoing infection or rapidly progressive neurological disease.  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 Cancer  Maternal Grandmother, Mother. Hypertension  Father, Mother.  Social History Tobacco use  Never smoker. 03/23/2014 Number of flights of stairs before winded  4-5 Most recent primary occupation  Physician Tobacco / smoke exposure  03/23/2014: no No history of drug/alcohol rehab  Not under pain contract  Exercise  Exercises daily; does running / walking Children  3 Current drinker  03/23/2014: Currently drinks beer, wine and hard liquor only occasionally per week Marital status  married Current work status  working full time  Medication History Benzodent (20-0.4-0.1% Ointment, Mouth/Throat) Active. ZyrTEC Allergy (10MG  Capsule, Oral) Active. Butalbital-APAP-Caffeine (50-325-40MG  Tablet, Oral) Active. MetFORMIN HCl (1000MG  Tablet, Oral) Active. Celecoxib (200MG  Capsule, Oral) Active. TraMADol HCl (50MG  Tablet, Oral) Active. Rizatriptan Benzoate (5MG  Tablet Disint, Oral) Active. Tamsulosin HCl (0.4MG  Capsule, Oral) Active. Cyclobenzaprine HCl (10MG  Tablet, Oral) Active. Diazepam (5MG  Tablet, Oral) Active. Baclofen (10MG  Tablet, Oral)  Active. OxyCODONE HCl (5MG  Tablet, Oral) Active.  Topiramate (25MG  Tablet, Oral) Active. Zolpidem Tartrate (10MG  Tablet, Oral) Active. Lidocaine (5% Patch, External) Active. Pravastatin Sodium (40MG  Tablet, Oral) Active.  Past Surgical History  Straighten Nasal Septum  Sinus Surgery  Total Hip Replacement  right Tonsillectomy  Arthroscopy of Knee  bilateral Anal Fissure Repair  Rotator Cuff Repair  left Arthroscopy of Shoulder  left    Review of Systems General Not Present- Chills, Fatigue, Fever, Memory Loss, Night Sweats, Weight Gain and Weight Loss. Skin Not Present- Eczema, Hives, Itching, Lesions and Rash. HEENT Not Present- Dentures, Double Vision, Headache, Hearing Loss, Tinnitus and Visual Loss. Respiratory Not Present- Allergies, Chronic Cough, Coughing up blood, Shortness of breath at rest and Shortness of breath with exertion. Cardiovascular Not Present- Chest Pain, Difficulty Breathing Lying Down, Murmur, Palpitations, Racing/skipping heartbeats and Swelling. Gastrointestinal Not Present- Abdominal Pain, Bloody Stool, Constipation, Diarrhea, Difficulty Swallowing, Heartburn, Jaundice, Loss of appetitie, Nausea and Vomiting. Male Genitourinary Not Present- Blood in Urine, Discharge, Flank Pain, Incontinence, Painful Urination, Urgency, Urinary frequency, Urinary Retention, Urinating at Night and Weak urinary stream. Musculoskeletal Not Present- Back Pain, Joint Pain, Joint Swelling, Morning Stiffness, Muscle Pain, Muscle Weakness and Spasms. Neurological Not Present- Blackout spells, Difficulty with balance, Dizziness, Paralysis, Tremor and Weakness. Psychiatric Not Present- Insomnia.  Vitals Weight: 284 lb Height: 72in Body Surface Area: 2.47 m Body Mass Index: 38.52 kg/m  Pulse: 88 (Regular)  BP: 114/76 (Sitting, Left Arm, Standard)   Physical Exam General Mental Status -Alert, cooperative and good historian. General  Appearance-pleasant, Not in acute distress. Orientation-Oriented X3. Build & Nutrition-Well nourished and Well developed.  Head and Neck Head-normocephalic, atraumatic . Neck Global Assessment - supple, no bruit auscultated on the right, no bruit auscultated on the left.  Eye Pupil - Bilateral-Regular and Round. Motion - Bilateral-EOMI.  Chest and Lung Exam Auscultation Breath sounds - clear at anterior chest wall and clear at posterior chest wall. Adventitious sounds - No Adventitious sounds.  Cardiovascular Auscultation Rhythm - Regular rate and rhythm. Heart Sounds - S1 WNL and S2 WNL. Murmurs & Other Heart Sounds - Auscultation of the heart reveals - No Murmurs.  Abdomen Palpation/Percussion Tenderness - Abdomen is non-tender to palpation. Rigidity (guarding) - Abdomen is soft. Auscultation Auscultation of the abdomen reveals - Bowel sounds normal.  Male Genitourinary Note: Not done, not pertinent to present illness   Musculoskeletal Note: A well developed male in no distress. His left knee shows no effusion. His range about 5 to 125, moderate crepitus on range of motion with tenderness lateral greater than medial with no instability noted.  RADIOGRAPHS AP and lateral of the knee showed that he is near bone on bone in the lateral compartment with near bone on bone patellofemoral. He has some tibial subluxation also.   Assessment & Plan  Primary osteoarthritis of left knee (M17.12)  Note:Surgical Plans: Left Total Knee Replacement  Disposition: Home and straight to Outpatient Therapy (Already Setup)  PCP: Dr. [name]  IV TXA  Patient was instructed on what medications to stop prior to surgery.  Signed electronically by Ok Edwards, III PA-C

## 2016-12-08 ENCOUNTER — Encounter (HOSPITAL_COMMUNITY)
Admission: RE | Disposition: A | Discharge status: Home / Self Care | Payer: Self-pay | Source: Ambulatory Visit | Attending: Orthopedic Surgery

## 2016-12-08 ENCOUNTER — Inpatient Hospital Stay (HOSPITAL_COMMUNITY)
Admission: RE | Admit: 2016-12-08 | Discharge: 2016-12-10 | DRG: 470 | Disposition: A | Discharge status: Home / Self Care | Payer: 59 | Source: Ambulatory Visit | Attending: Orthopedic Surgery | Admitting: Orthopedic Surgery

## 2016-12-08 ENCOUNTER — Inpatient Hospital Stay (HOSPITAL_COMMUNITY): Payer: 59 | Admitting: Certified Registered Nurse Anesthetist

## 2016-12-08 ENCOUNTER — Encounter (HOSPITAL_COMMUNITY): Payer: Self-pay

## 2016-12-08 DIAGNOSIS — Z79891 Long term (current) use of opiate analgesic: Secondary | ICD-10-CM | POA: Diagnosis not present

## 2016-12-08 DIAGNOSIS — M545 Low back pain, unspecified: Secondary | ICD-10-CM

## 2016-12-08 DIAGNOSIS — M170 Bilateral primary osteoarthritis of knee: Principal | ICD-10-CM | POA: Diagnosis present

## 2016-12-08 DIAGNOSIS — N400 Enlarged prostate without lower urinary tract symptoms: Secondary | ICD-10-CM | POA: Diagnosis present

## 2016-12-08 DIAGNOSIS — M1710 Unilateral primary osteoarthritis, unspecified knee: Secondary | ICD-10-CM | POA: Diagnosis present

## 2016-12-08 DIAGNOSIS — M25562 Pain in left knee: Secondary | ICD-10-CM | POA: Diagnosis present

## 2016-12-08 DIAGNOSIS — Z7984 Long term (current) use of oral hypoglycemic drugs: Secondary | ICD-10-CM | POA: Diagnosis not present

## 2016-12-08 DIAGNOSIS — Z79899 Other long term (current) drug therapy: Secondary | ICD-10-CM | POA: Diagnosis not present

## 2016-12-08 DIAGNOSIS — G43909 Migraine, unspecified, not intractable, without status migrainosus: Secondary | ICD-10-CM | POA: Diagnosis present

## 2016-12-08 DIAGNOSIS — G8918 Other acute postprocedural pain: Secondary | ICD-10-CM | POA: Diagnosis not present

## 2016-12-08 DIAGNOSIS — E119 Type 2 diabetes mellitus without complications: Secondary | ICD-10-CM | POA: Diagnosis present

## 2016-12-08 DIAGNOSIS — M25762 Osteophyte, left knee: Secondary | ICD-10-CM | POA: Diagnosis present

## 2016-12-08 DIAGNOSIS — M5382 Other specified dorsopathies, cervical region: Secondary | ICD-10-CM | POA: Diagnosis not present

## 2016-12-08 DIAGNOSIS — M2141 Flat foot [pes planus] (acquired), right foot: Secondary | ICD-10-CM | POA: Diagnosis present

## 2016-12-08 DIAGNOSIS — Z888 Allergy status to other drugs, medicaments and biological substances status: Secondary | ICD-10-CM | POA: Diagnosis not present

## 2016-12-08 DIAGNOSIS — M1712 Unilateral primary osteoarthritis, left knee: Secondary | ICD-10-CM

## 2016-12-08 DIAGNOSIS — M2142 Flat foot [pes planus] (acquired), left foot: Secondary | ICD-10-CM | POA: Diagnosis present

## 2016-12-08 DIAGNOSIS — G8929 Other chronic pain: Secondary | ICD-10-CM | POA: Diagnosis present

## 2016-12-08 DIAGNOSIS — M179 Osteoarthritis of knee, unspecified: Secondary | ICD-10-CM | POA: Diagnosis present

## 2016-12-08 DIAGNOSIS — G43719 Chronic migraine without aura, intractable, without status migrainosus: Secondary | ICD-10-CM | POA: Diagnosis not present

## 2016-12-08 HISTORY — PX: TOTAL KNEE ARTHROPLASTY: SHX125

## 2016-12-08 LAB — GLUCOSE, CAPILLARY
Glucose-Capillary: 96 mg/dL (ref 65–99)
Glucose-Capillary: 86 mg/dL (ref 65–99)
Glucose-Capillary: 147 mg/dL — ABNORMAL HIGH (ref 65–99)
Glucose-Capillary: 134 mg/dL — ABNORMAL HIGH (ref 65–99)
Glucose-Capillary: 138 mg/dL — ABNORMAL HIGH (ref 65–99)

## 2016-12-08 LAB — TYPE AND SCREEN
ABO/RH(D): O NEG
Antibody Screen: NEGATIVE

## 2016-12-08 LAB — APTT: aPTT: 28 seconds (ref 24–36)

## 2016-12-08 LAB — PROTIME-INR
INR: 1.05
Prothrombin Time: 13.7 seconds (ref 11.4–15.2)

## 2016-12-08 SURGERY — ARTHROPLASTY, KNEE, TOTAL
Anesthesia: Spinal | Site: Knee | Laterality: Left

## 2016-12-08 MED ORDER — ROPIVACAINE HCL 7.5 MG/ML IJ SOLN
INTRAMUSCULAR | Status: DC | PRN
  Administered 2016-12-08 (×2): 20 mL via PERINEURAL

## 2016-12-08 MED ORDER — LACTATED RINGERS IV SOLN
INTRAVENOUS | Status: DC
  Administered 2016-12-08: 07:00:00 via INTRAVENOUS

## 2016-12-08 MED ORDER — SODIUM CHLORIDE 0.9 % IV SOLN
INTRAVENOUS | Status: DC
  Administered 2016-12-08: 21:00:00 via INTRAVENOUS
  Administered 2016-12-08: 100 mL via INTRAVENOUS

## 2016-12-08 MED ORDER — MORPHINE SULFATE (PF) 4 MG/ML IV SOLN
1.0000 mg | INTRAVENOUS | Status: DC | PRN
  Administered 2016-12-08 – 2016-12-09 (×2): 1 mg via INTRAVENOUS
  Filled 2016-12-08 (×2): qty 1

## 2016-12-08 MED ORDER — PHENOL 1.4 % MT LIQD: 1.0000 | OROMUCOSAL | Status: DC | PRN

## 2016-12-08 MED ORDER — FLUTICASONE PROPIONATE 50 MCG/ACT NA SUSP
2.0000 | Freq: Every day | NASAL | Status: DC
  Filled 2016-12-08: qty 16

## 2016-12-08 MED ORDER — DEXAMETHASONE SODIUM PHOSPHATE 10 MG/ML IJ SOLN
10.0000 mg | Freq: Once | INTRAMUSCULAR | Status: AC
  Administered 2016-12-09: 10 mg via INTRAVENOUS
  Filled 2016-12-08: qty 1

## 2016-12-08 MED ORDER — METHOCARBAMOL 1000 MG/10ML IJ SOLN
500.0000 mg | Freq: Four times a day (QID) | INTRAVENOUS | Status: DC | PRN
  Administered 2016-12-08: 500 mg via INTRAVENOUS
  Filled 2016-12-08: qty 5
  Filled 2016-12-08: qty 550

## 2016-12-08 MED ORDER — TRANEXAMIC ACID 1000 MG/10ML IV SOLN
1000.0000 mg | Freq: Once | INTRAVENOUS | Status: AC
  Administered 2016-12-08: 1000 mg via INTRAVENOUS
  Filled 2016-12-08: qty 1100

## 2016-12-08 MED ORDER — DOCUSATE SODIUM 100 MG PO CAPS
100.0000 mg | ORAL_CAPSULE | Freq: Two times a day (BID) | ORAL | Status: DC
  Administered 2016-12-08 – 2016-12-10 (×5): 100 mg via ORAL
  Filled 2016-12-08 (×5): qty 1

## 2016-12-08 MED ORDER — ONDANSETRON HCL 4 MG/2ML IJ SOLN
INTRAMUSCULAR | Status: AC
  Filled 2016-12-08: qty 2

## 2016-12-08 MED ORDER — RIVAROXABAN 10 MG PO TABS
10.0000 mg | ORAL_TABLET | Freq: Every day | ORAL | Status: DC
  Administered 2016-12-09 – 2016-12-10 (×2): 10 mg via ORAL
  Filled 2016-12-08 (×2): qty 1

## 2016-12-08 MED ORDER — ACETAMINOPHEN 325 MG PO TABS: 650.0000 mg | ORAL_TABLET | Freq: Four times a day (QID) | ORAL | Status: DC | PRN

## 2016-12-08 MED ORDER — BUPIVACAINE IN DEXTROSE 0.75-8.25 % IT SOLN
INTRATHECAL | Status: DC | PRN
  Administered 2016-12-08: 2 mL via INTRATHECAL

## 2016-12-08 MED ORDER — MIDAZOLAM HCL 5 MG/5ML IJ SOLN
INTRAMUSCULAR | Status: DC | PRN
  Administered 2016-12-08: 2 mg via INTRAVENOUS

## 2016-12-08 MED ORDER — TRAMADOL HCL 50 MG PO TABS
50.0000 mg | ORAL_TABLET | Freq: Four times a day (QID) | ORAL | Status: DC | PRN
  Administered 2016-12-09 – 2016-12-10 (×2): 100 mg via ORAL
  Filled 2016-12-08 (×2): qty 2

## 2016-12-08 MED ORDER — SODIUM CHLORIDE 0.9 % IJ SOLN
INTRAMUSCULAR | Status: AC
  Filled 2016-12-08: qty 50

## 2016-12-08 MED ORDER — GABAPENTIN 300 MG PO CAPS
300.0000 mg | ORAL_CAPSULE | Freq: Three times a day (TID) | ORAL | Status: DC
  Administered 2016-12-08 – 2016-12-10 (×6): 300 mg via ORAL
  Filled 2016-12-08 (×6): qty 1

## 2016-12-08 MED ORDER — CHLORHEXIDINE GLUCONATE 4 % EX LIQD: 60.0000 mL | Freq: Once | CUTANEOUS | Status: DC

## 2016-12-08 MED ORDER — PROPOFOL 10 MG/ML IV BOLUS
INTRAVENOUS | Status: DC | PRN
  Administered 2016-12-08: 20 mg via INTRAVENOUS

## 2016-12-08 MED ORDER — SODIUM CHLORIDE 0.9 % IV SOLN
1000.0000 mg | INTRAVENOUS | Status: AC
  Administered 2016-12-08: 1000 mg via INTRAVENOUS
  Filled 2016-12-08: qty 1100

## 2016-12-08 MED ORDER — HYDROMORPHONE HCL 1 MG/ML IJ SOLN: 0.2500 mg | INTRAMUSCULAR | Status: DC | PRN

## 2016-12-08 MED ORDER — ACETAMINOPHEN 10 MG/ML IV SOLN
1000.0000 mg | Freq: Once | INTRAVENOUS | Status: AC
  Administered 2016-12-08: 1000 mg via INTRAVENOUS

## 2016-12-08 MED ORDER — LIDOCAINE 2% (20 MG/ML) 5 ML SYRINGE
INTRAMUSCULAR | Status: DC | PRN
  Administered 2016-12-08: 100 mg via INTRAVENOUS

## 2016-12-08 MED ORDER — SODIUM CHLORIDE 0.9 % IJ SOLN
INTRAMUSCULAR | Status: DC | PRN
  Administered 2016-12-08: 30 mL

## 2016-12-08 MED ORDER — TOPIRAMATE 25 MG PO TABS
25.0000 mg | ORAL_TABLET | Freq: Every day | ORAL | Status: DC
  Filled 2016-12-08: qty 1

## 2016-12-08 MED ORDER — METHOCARBAMOL 500 MG PO TABS
500.0000 mg | ORAL_TABLET | Freq: Four times a day (QID) | ORAL | Status: DC | PRN
  Administered 2016-12-08 – 2016-12-10 (×6): 500 mg via ORAL
  Filled 2016-12-08 (×6): qty 1

## 2016-12-08 MED ORDER — OXYCODONE HCL 5 MG PO TABS
5.0000 mg | ORAL_TABLET | ORAL | Status: DC | PRN
  Administered 2016-12-08: 10 mg via ORAL
  Administered 2016-12-08: 20 mg via ORAL
  Administered 2016-12-08: 10 mg via ORAL
  Administered 2016-12-08: 15 mg via ORAL
  Administered 2016-12-09 – 2016-12-10 (×8): 20 mg via ORAL
  Filled 2016-12-08 (×5): qty 4
  Filled 2016-12-08: qty 2
  Filled 2016-12-08: qty 3
  Filled 2016-12-08 (×5): qty 4

## 2016-12-08 MED ORDER — PROMETHAZINE HCL 25 MG/ML IJ SOLN: 6.2500 mg | INTRAMUSCULAR | Status: DC | PRN

## 2016-12-08 MED ORDER — INSULIN ASPART 100 UNIT/ML ~~LOC~~ SOLN
0.0000 [IU] | Freq: Three times a day (TID) | SUBCUTANEOUS | Status: DC
  Administered 2016-12-08 – 2016-12-09 (×3): 2 [IU] via SUBCUTANEOUS

## 2016-12-08 MED ORDER — POLYETHYLENE GLYCOL 3350 17 G PO PACK
17.0000 g | PACK | Freq: Every day | ORAL | Status: DC | PRN
  Filled 2016-12-08: qty 1

## 2016-12-08 MED ORDER — MENTHOL 3 MG MT LOZG: 1.0000 | LOZENGE | OROMUCOSAL | Status: DC | PRN

## 2016-12-08 MED ORDER — PROPOFOL 10 MG/ML IV BOLUS
INTRAVENOUS | Status: AC
  Filled 2016-12-08: qty 20

## 2016-12-08 MED ORDER — DIPHENHYDRAMINE HCL 12.5 MG/5ML PO ELIX: 12.5000 mg | ORAL_SOLUTION | ORAL | Status: DC | PRN

## 2016-12-08 MED ORDER — ONDANSETRON HCL 4 MG PO TABS: 4.0000 mg | ORAL_TABLET | Freq: Four times a day (QID) | ORAL | Status: DC | PRN

## 2016-12-08 MED ORDER — BUPIVACAINE HCL (PF) 0.25 % IJ SOLN
INTRAMUSCULAR | Status: AC
  Filled 2016-12-08: qty 30

## 2016-12-08 MED ORDER — GUAIFENESIN 200 MG PO TABS
200.0000 mg | ORAL_TABLET | ORAL | Status: DC | PRN
  Filled 2016-12-08: qty 1

## 2016-12-08 MED ORDER — METOCLOPRAMIDE HCL 5 MG PO TABS: 5.0000 mg | ORAL_TABLET | Freq: Three times a day (TID) | ORAL | Status: DC | PRN

## 2016-12-08 MED ORDER — MIDAZOLAM HCL 2 MG/2ML IJ SOLN
INTRAMUSCULAR | Status: AC
  Filled 2016-12-08: qty 2

## 2016-12-08 MED ORDER — ONDANSETRON HCL 4 MG/2ML IJ SOLN: 4.0000 mg | Freq: Four times a day (QID) | INTRAMUSCULAR | Status: DC | PRN

## 2016-12-08 MED ORDER — BUPIVACAINE LIPOSOME 1.3 % IJ SUSP
INTRAMUSCULAR | Status: DC | PRN
  Administered 2016-12-08: 20 mL

## 2016-12-08 MED ORDER — DIAZEPAM 5 MG PO TABS
5.0000 mg | ORAL_TABLET | Freq: Two times a day (BID) | ORAL | Status: DC
  Administered 2016-12-08 – 2016-12-10 (×4): 5 mg via ORAL
  Filled 2016-12-08 (×4): qty 1

## 2016-12-08 MED ORDER — PROPOFOL 10 MG/ML IV BOLUS
INTRAVENOUS | Status: AC
  Filled 2016-12-08: qty 40

## 2016-12-08 MED ORDER — TAMSULOSIN HCL 0.4 MG PO CAPS
0.4000 mg | ORAL_CAPSULE | Freq: Every day | ORAL | Status: DC
  Administered 2016-12-09 – 2016-12-10 (×3): 0.4 mg via ORAL
  Filled 2016-12-08 (×3): qty 1

## 2016-12-08 MED ORDER — SUMATRIPTAN SUCCINATE 50 MG PO TABS
50.0000 mg | ORAL_TABLET | ORAL | Status: DC | PRN
  Filled 2016-12-08: qty 1

## 2016-12-08 MED ORDER — GUAIFENESIN 100 MG/5ML PO SOLN: 100.0000 mg | ORAL | Status: DC | PRN

## 2016-12-08 MED ORDER — ACETAMINOPHEN 650 MG RE SUPP: 650.0000 mg | Freq: Four times a day (QID) | RECTAL | Status: DC | PRN

## 2016-12-08 MED ORDER — CEFAZOLIN SODIUM-DEXTROSE 2-4 GM/100ML-% IV SOLN
2.0000 g | Freq: Four times a day (QID) | INTRAVENOUS | Status: AC
  Administered 2016-12-08 (×2): 2 g via INTRAVENOUS
  Filled 2016-12-08 (×2): qty 100

## 2016-12-08 MED ORDER — DEXAMETHASONE SODIUM PHOSPHATE 10 MG/ML IJ SOLN
INTRAMUSCULAR | Status: AC
  Filled 2016-12-08: qty 1

## 2016-12-08 MED ORDER — METOCLOPRAMIDE HCL 5 MG/ML IJ SOLN: 5.0000 mg | Freq: Three times a day (TID) | INTRAMUSCULAR | Status: DC | PRN

## 2016-12-08 MED ORDER — BISACODYL 10 MG RE SUPP: 10.0000 mg | Freq: Every day | RECTAL | Status: DC | PRN

## 2016-12-08 MED ORDER — ZOLPIDEM TARTRATE 10 MG PO TABS
10.0000 mg | ORAL_TABLET | Freq: Every evening | ORAL | Status: DC | PRN
  Administered 2016-12-08 – 2016-12-09 (×2): 10 mg via ORAL
  Filled 2016-12-08 (×2): qty 1

## 2016-12-08 MED ORDER — BUPIVACAINE LIPOSOME 1.3 % IJ SUSP
20.0000 mL | Freq: Once | INTRAMUSCULAR | Status: DC
  Filled 2016-12-08: qty 20

## 2016-12-08 MED ORDER — FLEET ENEMA 7-19 GM/118ML RE ENEM
1.0000 | ENEMA | Freq: Once | RECTAL | Status: DC | PRN
Start: 2016-12-08 — End: 2016-12-10

## 2016-12-08 MED ORDER — ONDANSETRON HCL 4 MG/2ML IJ SOLN
INTRAMUSCULAR | Status: DC | PRN
  Administered 2016-12-08: 4 mg via INTRAVENOUS

## 2016-12-08 MED ORDER — DEXAMETHASONE SODIUM PHOSPHATE 10 MG/ML IJ SOLN
10.0000 mg | Freq: Once | INTRAMUSCULAR | Status: AC
  Administered 2016-12-08: 10 mg via INTRAVENOUS

## 2016-12-08 MED ORDER — ACETAMINOPHEN 500 MG PO TABS
1000.0000 mg | ORAL_TABLET | Freq: Four times a day (QID) | ORAL | Status: AC
  Administered 2016-12-08 – 2016-12-09 (×4): 1000 mg via ORAL
  Filled 2016-12-08 (×4): qty 2

## 2016-12-08 MED ORDER — BUPIVACAINE HCL 0.25 % IJ SOLN: INTRAMUSCULAR | Status: DC | PRN

## 2016-12-08 MED ORDER — PROPOFOL 500 MG/50ML IV EMUL
INTRAVENOUS | Status: DC | PRN
Start: 2016-12-08 — End: 2016-12-08
  Administered 2016-12-08: 45 ug/kg/min via INTRAVENOUS

## 2016-12-08 MED ORDER — BUTALBITAL-APAP-CAFFEINE 50-325-40 MG PO TABS: 2.0000 | ORAL_TABLET | Freq: Two times a day (BID) | ORAL | Status: DC | PRN

## 2016-12-08 MED ORDER — ACETAMINOPHEN 10 MG/ML IV SOLN
INTRAVENOUS | Status: AC
Start: 2016-12-08 — End: 2016-12-08
  Filled 2016-12-08: qty 100

## 2016-12-08 MED ORDER — LIDOCAINE 2% (20 MG/ML) 5 ML SYRINGE
INTRAMUSCULAR | Status: AC
  Filled 2016-12-08: qty 5

## 2016-12-08 MED ORDER — BACLOFEN 10 MG PO TABS: 10.0000 mg | ORAL_TABLET | Freq: Two times a day (BID) | ORAL | Status: DC | PRN

## 2016-12-08 SURGICAL SUPPLY — 52 items
BAG DECANTER FOR FLEXI CONT (MISCELLANEOUS) ×2 IMPLANT
BAG ZIPLOCK 12X15 (MISCELLANEOUS) ×2 IMPLANT
BANDAGE ACE 6X5 VEL STRL LF (GAUZE/BANDAGES/DRESSINGS) ×2 IMPLANT
BANDAGE ELASTIC 6 VELCRO ST LF (GAUZE/BANDAGES/DRESSINGS) ×2 IMPLANT
BLADE SAG 18X100X1.27 (BLADE) ×2 IMPLANT
BLADE SAW SGTL 11.0X1.19X90.0M (BLADE) ×2 IMPLANT
BOWL SMART MIX CTS (DISPOSABLE) ×2 IMPLANT
CAPT KNEE TOTAL 3 ATTUNE ×2 IMPLANT
CEMENT HV SMART SET (Cement) ×4 IMPLANT
CLOTH BEACON ORANGE TIMEOUT ST (SAFETY) ×2 IMPLANT
CUFF TOURN SGL QUICK 34 (TOURNIQUET CUFF) ×1
CUFF TRNQT CYL 34X4X40X1 (TOURNIQUET CUFF) ×1 IMPLANT
DECANTER SPIKE VIAL GLASS SM (MISCELLANEOUS) ×2 IMPLANT
DRAPE U-SHAPE 47X51 STRL (DRAPES) ×2 IMPLANT
DRSG ADAPTIC 3X8 NADH LF (GAUZE/BANDAGES/DRESSINGS) ×2 IMPLANT
DRSG PAD ABDOMINAL 8X10 ST (GAUZE/BANDAGES/DRESSINGS) ×2 IMPLANT
DURAPREP 26ML APPLICATOR (WOUND CARE) ×2 IMPLANT
ELECT REM PT RETURN 9FT ADLT (ELECTROSURGICAL) ×2
ELECTRODE REM PT RTRN 9FT ADLT (ELECTROSURGICAL) ×1 IMPLANT
EVACUATOR 1/8 PVC DRAIN (DRAIN) ×2 IMPLANT
GAUZE SPONGE 4X4 12PLY STRL (GAUZE/BANDAGES/DRESSINGS) ×2 IMPLANT
GLOVE BIO SURGEON STRL SZ7.5 (GLOVE) IMPLANT
GLOVE BIO SURGEON STRL SZ8 (GLOVE) ×2 IMPLANT
GLOVE BIOGEL PI IND STRL 6.5 (GLOVE) IMPLANT
GLOVE BIOGEL PI IND STRL 8 (GLOVE) ×1 IMPLANT
GLOVE BIOGEL PI INDICATOR 6.5 (GLOVE)
GLOVE BIOGEL PI INDICATOR 8 (GLOVE) ×1
GLOVE SURG SS PI 6.5 STRL IVOR (GLOVE) IMPLANT
GOWN STRL REUS W/TWL LRG LVL3 (GOWN DISPOSABLE) ×2 IMPLANT
GOWN STRL REUS W/TWL XL LVL3 (GOWN DISPOSABLE) IMPLANT
HANDPIECE INTERPULSE COAX TIP (DISPOSABLE) ×1
IMMOBILIZER KNEE 20 (SOFTGOODS) ×2
IMMOBILIZER KNEE 20 THIGH 36 (SOFTGOODS) ×1 IMPLANT
MANIFOLD NEPTUNE II (INSTRUMENTS) ×2 IMPLANT
NS IRRIG 1000ML POUR BTL (IV SOLUTION) ×2 IMPLANT
PACK TOTAL KNEE CUSTOM (KITS) ×2 IMPLANT
PAD ABD 8X10 STRL (GAUZE/BANDAGES/DRESSINGS) ×2 IMPLANT
PADDING CAST COTTON 6X4 STRL (CAST SUPPLIES) ×2 IMPLANT
POSITIONER SURGICAL ARM (MISCELLANEOUS) ×2 IMPLANT
SET HNDPC FAN SPRY TIP SCT (DISPOSABLE) ×1 IMPLANT
STRIP CLOSURE SKIN 1/2X4 (GAUZE/BANDAGES/DRESSINGS) ×2 IMPLANT
SUT MNCRL AB 4-0 PS2 18 (SUTURE) ×2 IMPLANT
SUT STRATAFIX 0 PDS 27 VIOLET (SUTURE) ×2
SUT VIC AB 2-0 CT1 27 (SUTURE) ×3
SUT VIC AB 2-0 CT1 TAPERPNT 27 (SUTURE) ×3 IMPLANT
SUT VLOC 180 0 24IN GS25 (SUTURE) IMPLANT
SUTURE STRATFX 0 PDS 27 VIOLET (SUTURE) ×1 IMPLANT
SYR 50ML LL SCALE MARK (SYRINGE) ×2 IMPLANT
TRAY FOLEY W/METER SILVER 16FR (SET/KITS/TRAYS/PACK) ×2 IMPLANT
WATER STERILE IRR 1000ML POUR (IV SOLUTION) ×4 IMPLANT
WRAP KNEE MAXI GEL POST OP (GAUZE/BANDAGES/DRESSINGS) ×2 IMPLANT
YANKAUER SUCT BULB TIP 10FT TU (MISCELLANEOUS) ×2 IMPLANT

## 2016-12-08 NOTE — Anesthesia Preprocedure Evaluation (Addendum)
Anesthesia Evaluation  Patient identified by MRN, date of birth, ID band Patient awake    Reviewed: Allergy & Precautions, NPO status , Patient's Chart, lab work & pertinent test results  Airway Mallampati: II  TM Distance: >3 FB Neck ROM: Full    Dental no notable dental hx.    Pulmonary pneumonia,    Pulmonary exam normal breath sounds clear to auscultation       Cardiovascular negative cardio ROS Normal cardiovascular exam Rhythm:Regular Rate:Normal     Neuro/Psych  Headaches, negative neurological ROS  negative psych ROS   GI/Hepatic negative GI ROS, Neg liver ROS,   Endo/Other  diabetes  Renal/GU negative Renal ROS  negative genitourinary   Musculoskeletal negative musculoskeletal ROS (+) Arthritis ,   Abdominal   Peds negative pediatric ROS (+)  Hematology negative hematology ROS (+)   Anesthesia Other Findings   Reproductive/Obstetrics negative OB ROS                            Anesthesia Physical Anesthesia Plan  ASA: III  Anesthesia Plan: Spinal   Post-op Pain Management:  Regional for Post-op pain   Induction: Intravenous  Airway Management Planned: Simple Face Mask  Additional Equipment:   Intra-op Plan:   Post-operative Plan:   Informed Consent: I have reviewed the patients History and Physical, chart, labs and discussed the procedure including the risks, benefits and alternatives for the proposed anesthesia with the patient or authorized representative who has indicated his/her understanding and acceptance.   Dental advisory given  Plan Discussed with: CRNA, Anesthesiologist and Surgeon  Anesthesia Plan Comments:        Anesthesia Quick Evaluation

## 2016-12-08 NOTE — H&P (View-Only) (Signed)
Daniel Crawford MD DOB: 1961-08-21 Married / Language: English / Race: Black or African American Male Date of Admission:  12/08/2016 CC:  Left Knee Pain History of Present Illness The patient is a 56 year old male who comes in for a preoperative History and Physical. The patient is scheduled for a left total knee arthroplasty to be performed by Dr. [name]. Aluisio, MD at [facility] on 12-08-2016. The patient is a 56 year old male who presented for follow up of their knee. The patient is being followed for their left knee pain and osteoarthritis. They are now months out from [name] series. Symptoms reported include: pain and swelling. The patient feels that they are doing poorly and report their pain level to be moderate to severe. The following medication has been used for pain control: antiinflammatory medication. The patient has reported only temporary improvement of their symptoms with: Cortisone injections and viscosupplementation. Unfortunately Micah' knee is getting progressively worse. It is now hurting him at all times. It is limiting what he can and cannot do. 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 Last series of viscosupplements did not do as well as it had in the past. Cortisone helps, but it is wearing in a very short amount of time. He is now at a stage where he is ready to proceed with knee replacement. They have been treated conservatively in the past for the above stated problem and despite conservative measures, they continue to have progressive pain and severe functional limitations and dysfunction. They have failed non-operative management including home exercise, medications, and injections. It is felt that they would benefit from undergoing total joint replacement. Risks and benefits of the procedure have been discussed with the patient and they elect to proceed with surgery. There are no active  contraindications to surgery such as ongoing infection or rapidly progressive neurological disease.  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 Cancer  Maternal Grandmother, Mother. Hypertension  Father, Mother.  Social History Tobacco use  Never smoker. 03/23/2014 Number of flights of stairs before winded  4-5 Most recent primary occupation  Physician Tobacco / smoke exposure  03/23/2014: no No history of drug/alcohol rehab  Not under pain contract  Exercise  Exercises daily; does running / walking Children  3 Current drinker  03/23/2014: Currently drinks beer, wine and hard liquor only occasionally per week Marital status  married Current work status  working full time  Medication History Benzodent (20-0.4-0.1% Ointment, Mouth/Throat) Active. ZyrTEC Allergy (10MG  Capsule, Oral) Active. Butalbital-APAP-Caffeine (50-325-40MG  Tablet, Oral) Active. MetFORMIN HCl (1000MG  Tablet, Oral) Active. Celecoxib (200MG  Capsule, Oral) Active. TraMADol HCl (50MG  Tablet, Oral) Active. Rizatriptan Benzoate (5MG  Tablet Disint, Oral) Active. Tamsulosin HCl (0.4MG  Capsule, Oral) Active. Cyclobenzaprine HCl (10MG  Tablet, Oral) Active. Diazepam (5MG  Tablet, Oral) Active. Baclofen (10MG  Tablet, Oral)  Active. OxyCODONE HCl (5MG  Tablet, Oral) Active.  Topiramate (25MG  Tablet, Oral) Active. Zolpidem Tartrate (10MG  Tablet, Oral) Active. Lidocaine (5% Patch, External) Active. Pravastatin Sodium (40MG  Tablet, Oral) Active.  Past Surgical History  Straighten Nasal Septum  Sinus Surgery  Total Hip Replacement  right Tonsillectomy  Arthroscopy of Knee  bilateral Anal Fissure Repair  Rotator Cuff Repair  left Arthroscopy of Shoulder  left    Review of Systems General Not Present- Chills, Fatigue, Fever, Memory Loss, Night Sweats, Weight Gain and Weight Loss. Skin Not Present- Eczema, Hives, Itching, Lesions and Rash. HEENT Not Present- Dentures, Double Vision, Headache, Hearing Loss, Tinnitus and Visual Loss. Respiratory Not Present- Allergies, Chronic Cough, Coughing up blood, Shortness of breath at rest and Shortness of breath with exertion. Cardiovascular Not Present- Chest Pain, Difficulty Breathing Lying Down, Murmur, Palpitations, Racing/skipping heartbeats and Swelling. Gastrointestinal Not Present- Abdominal Pain, Bloody Stool, Constipation, Diarrhea, Difficulty Swallowing, Heartburn, Jaundice, Loss of appetitie, Nausea and Vomiting. Male Genitourinary Not Present- Blood in Urine, Discharge, Flank Pain, Incontinence, Painful Urination, Urgency, Urinary frequency, Urinary Retention, Urinating at Night and Weak urinary stream. Musculoskeletal Not Present- Back Pain, Joint Pain, Joint Swelling, Morning Stiffness, Muscle Pain, Muscle Weakness and Spasms. Neurological Not Present- Blackout spells, Difficulty with balance, Dizziness, Paralysis, Tremor and Weakness. Psychiatric Not Present- Insomnia.  Vitals Weight: 284 lb Height: 72in Body Surface Area: 2.47 m Body Mass Index: 38.52 kg/m  Pulse: 88 (Regular)  BP: 114/76 (Sitting, Left Arm, Standard)   Physical Exam General Mental Status -Alert, cooperative and good historian. General  Appearance-pleasant, Not in acute distress. Orientation-Oriented X3. Build & Nutrition-Well nourished and Well developed.  Head and Neck Head-normocephalic, atraumatic . Neck Global Assessment - supple, no bruit auscultated on the right, no bruit auscultated on the left.  Eye Pupil - Bilateral-Regular and Round. Motion - Bilateral-EOMI.  Chest and Lung Exam Auscultation Breath sounds - clear at anterior chest wall and clear at posterior chest wall. Adventitious sounds - No Adventitious sounds.  Cardiovascular Auscultation Rhythm - Regular rate and rhythm. Heart Sounds - S1 WNL and S2 WNL. Murmurs & Other Heart Sounds - Auscultation of the heart reveals - No Murmurs.  Abdomen Palpation/Percussion Tenderness - Abdomen is non-tender to palpation. Rigidity (guarding) - Abdomen is soft. Auscultation Auscultation of the abdomen reveals - Bowel sounds normal.  Male Genitourinary Note: Not done, not pertinent to present illness   Musculoskeletal Note: A well developed male in no distress. His left knee shows no effusion. His range about 5 to 125, moderate crepitus on range of motion with tenderness lateral greater than medial with no instability noted.  RADIOGRAPHS AP and lateral of the knee showed that he is near bone on bone in the lateral compartment with near bone on bone patellofemoral. He has some tibial subluxation also.   Assessment & Plan  Primary osteoarthritis of left knee (M17.12)  Note:Surgical Plans: Left Total Knee Replacement  Disposition: Home and straight to Outpatient Therapy (Already Setup)  PCP: Dr. [name]  IV TXA  Patient was instructed on what medications to stop prior to surgery.  Signed electronically by Ok Edwards, III PA-C

## 2016-12-08 NOTE — Anesthesia Postprocedure Evaluation (Addendum)
Anesthesia Post Note  Patient: Daniel Bossier, MD  Procedure(s) Performed: Procedure(s) (LRB): LEFT TOTAL KNEE ARTHROPLASTY (Left)  Patient location during evaluation: PACU Anesthesia Type: Spinal Level of consciousness: oriented and awake and alert Pain management: pain level controlled Vital Signs Assessment: post-procedure vital signs reviewed and stable Respiratory status: spontaneous breathing, respiratory function stable and patient connected to nasal cannula oxygen Cardiovascular status: blood pressure returned to baseline and stable Postop Assessment: no headache and no backache Anesthetic complications: no       Last Vitals:  Vitals:   12/08/16 0905 12/08/16 0915  BP: (!) 141/73 (!) 140/92  Pulse: 75 66  Resp: 15 14  Temp:      Last Pain:  Vitals:   12/08/16 0837  TempSrc:   PainSc: 0-No pain    LLE Motor Response: Purposeful movement (bends knee off bed) (12/08/16 0915) LLE Sensation: Decreased;Numbness (12/08/16 0915) RLE Motor Response: Purposeful movement (bends knee off bed) (12/08/16 0915) RLE Sensation: Decreased;Numbness (12/08/16 0915) L Sensory Level: L2-Upper inner thigh, upper buttock (12/08/16 0915) R Sensory Level: L2-Upper inner thigh, upper buttock (12/08/16 0915)  Sherylann Vangorden S

## 2016-12-08 NOTE — Transfer of Care (Signed)
Immediate Anesthesia Transfer of Care Note  Patient: Daniel Bossier, MD  Procedure(s) Performed: Procedure(s): LEFT TOTAL KNEE ARTHROPLASTY (Left)  Patient Location: PACU  Anesthesia Type:MAC and Spinal  Level of Consciousness: awake, alert , oriented and patient cooperative  Airway & Oxygen Therapy: Patient Spontanous Breathing and Patient connected to face mask oxygen  Post-op Assessment: Report given to RN and Post -op Vital signs reviewed and stable  Post vital signs: Reviewed and stable  Last Vitals:  Vitals:   12/08/16 0532  BP: 131/75  Pulse: 94  Resp: 18  Temp: 37.2 C    Last Pain:  Vitals:   12/08/16 0539  TempSrc:   PainSc: 3       Patients Stated Pain Goal: 4 (16/10/96 0454)  Complications: No apparent anesthesia complications

## 2016-12-08 NOTE — Evaluation (Signed)
Physical Therapy Evaluation Patient Details Name: Daniel GEDDIE, MD MRN: [id number] DOB: 07-28-1961 Today's Date: 12/08/2016   History of Present Illness  L TKA  Clinical Impression  Pt is s/p TKA resulting in the deficits listed below (see PT Problem List). Pt ambulated 28' with RW, distance limited by pain. Initiated HEP. Good progress expected.  Pt will benefit from skilled PT to increase their independence and safety with mobility to allow discharge to the venue listed below.      Follow Up Recommendations Outpatient PT    Equipment Recommendations  Rolling walker with 5" wheels;3in1 (PT)    Recommendations for Other Services       Precautions / Restrictions Precautions Precautions: Knee Precaution Comments: reviewed no pillow under knee Required Braces or Orthoses: Knee Immobilizer - Left Knee Immobilizer - Left: Discontinue once straight leg raise with < 10 degree lag Restrictions Weight Bearing Restrictions: No Other Position/Activity Restrictions: wbat      Mobility  Bed Mobility Overal bed mobility: Needs Assistance Bed Mobility: Supine to Sit     Supine to sit: Min assist     General bed mobility comments: assist for LLE OOB  Transfers Overall transfer level: Needs assistance Equipment used: Rolling walker (2 wheeled) Transfers: Sit to/from Stand Sit to Stand: From elevated surface;Min guard         General transfer comment: VCs hand placement, min/guard for safety  Ambulation/Gait Ambulation/Gait assistance: Min guard Ambulation Distance (Feet): 28 Feet Assistive device: Rolling walker (2 wheeled) Gait Pattern/deviations: Step-to pattern;Antalgic   Gait velocity interpretation: Below normal speed for age/gender General Gait Details: steady, no LOB, VCs for sequencing, distance limited by pain  Stairs            Wheelchair Mobility    Modified Rankin (Stroke Patients Only)       Balance Overall balance assessment: Modified  Independent                                           Pertinent Vitals/Pain Pain Assessment: 0-10 Pain Score: 8  Pain Location: L TKA Pain Descriptors / Indicators: Sore Pain Intervention(s): Limited activity within patient's tolerance;Monitored during session;Premedicated before session;Ice applied;Patient requesting pain meds-RN notified    Home Living Family/patient expects to be discharged to:: Private residence Living Arrangements: Spouse/significant other Available Help at Discharge: Family;Available PRN/intermittently Type of Home: House Home Access: Stairs to enter Entrance Stairs-Rails: None Entrance Stairs-Number of Steps: 4 Home Layout: Two level;Able to live on main level with bedroom/bathroom Home Equipment: None      Prior Function Level of Independence: Independent               Hand Dominance        Extremity/Trunk Assessment   Upper Extremity Assessment Upper Extremity Assessment: Overall WFL for tasks assessed    Lower Extremity Assessment Lower Extremity Assessment: LLE deficits/detail LLE Deficits / Details: SLR 2/5, knee AAROM 5-55*    Cervical / Trunk Assessment Cervical / Trunk Assessment: Normal  Communication   Communication: No difficulties  Cognition Arousal/Alertness: Awake/alert Behavior During Therapy: WFL for tasks assessed/performed Overall Cognitive Status: Within Functional Limits for tasks assessed                      General Comments      Exercises Total Joint Exercises Ankle Circles/Pumps: AROM;Both;10 reps Quad Sets: AROM;Left;5 reps;Supine Heel  Slides: AAROM;Left;10 reps;Supine Straight Leg Raises: AAROM;Left;5 reps;Supine   Assessment/Plan    PT Assessment Patient needs continued PT services  PT Problem List Decreased strength;Decreased range of motion;Decreased activity tolerance;Decreased balance;Decreased knowledge of use of DME;Decreased mobility;Pain       PT Treatment  Interventions DME instruction;Gait training;Stair training;Functional mobility training;Therapeutic exercise;Balance training;Therapeutic activities;Patient/family education    PT Goals (Current goals can be found in the Care Plan section)  Acute Rehab PT Goals Patient Stated Goal: walk without pain (wants to parachute and ski but Dr Wynelle Link said no) PT Goal Formulation: With patient/family Time For Goal Achievement: 12/12/16 Potential to Achieve Goals: Good    Frequency 7X/week   Barriers to discharge        Co-evaluation               End of Session Equipment Utilized During Treatment: Gait belt Activity Tolerance: Patient limited by pain Patient left: in chair;with call bell/phone within reach;with family/visitor present Nurse Communication: Mobility status PT Visit Diagnosis: Muscle weakness (generalized) (M62.81);Difficulty in walking, not elsewhere classified (R26.2);Pain Pain - Right/Left: Left Pain - part of body: Knee         Time: [id number] PT Time Calculation (min) (ACUTE ONLY): 40 min   Charges:   PT Evaluation $PT Eval Low Complexity: 1 Procedure PT Treatments $Gait Training: 8-22 mins $Therapeutic Exercise: 8-22 mins   PT G Codes:         Philomena Doheny 12/08/2016, 5:01 PM [phone]

## 2016-12-08 NOTE — Interval H&P Note (Signed)
History and Physical Interval Note:  12/08/2016 6:56 AM  [name], MD  has presented today for surgery, with the diagnosis of LEFT KNEE OA  The various methods of treatment have been discussed with the patient and family. After consideration of risks, benefits and other options for treatment, the patient has consented to  Procedure(s): LEFT TOTAL KNEE ARTHROPLASTY (Left) as a surgical intervention .  The patient's history has been reviewed, patient examined, no change in status, stable for surgery.  I have reviewed the patient's chart and labs.  Questions were answered to the patient's satisfaction.     Gearlean Alf

## 2016-12-08 NOTE — Op Note (Signed)
OPERATIVE REPORT-TOTAL KNEE ARTHROPLASTY   Pre-operative diagnosis- Osteoarthritis  Left knee(s)  Post-operative diagnosis- Osteoarthritis Left knee(s)  Procedure-  Left  Total Knee Arthroplasty (Depuy Attune)  Surgeon- Daniel Plover. Jobeth Pangilinan, MD  Assistant- Ardeen Jourdain, PA-C   Anesthesia-  Spinal  EBL-* No blood loss amount entered *   Drains Hemovac  Tourniquet time-  Total Tourniquet Time Documented: Thigh (Left) - 41 minutes Total: Thigh (Left) - 41 minutes     Complications- None  Condition-PACU - hemodynamically stable.   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 He has constant pain, with activity and at rest and significant functional deficits with difficulties even with ADLs. He has had extensive non-op management including analgesics, injections of cortisone and viscosupplements, and home exercise program, but remains in significant pain with significant dysfunction. Radiographs show bone on bone arthritis lateral and patellofemoral. He presents now for left Total Knee Arthroplasty.     Procedure in detail---   The patient is brought into the operating room and positioned supine on the operating table. After successful administration of  Spinal,   a tourniquet is placed high on the  Left thigh(s) and the lower extremity is prepped and draped in the usual sterile fashion. Time out is performed by the operating team and then the  Left lower extremity is wrapped in Esmarch, knee flexed and the tourniquet inflated to 300 mmHg.       A midline incision is made with a ten blade through the subcutaneous tissue to the level of the extensor mechanism. A fresh blade is used to make a medial parapatellar arthrotomy. Soft tissue over the proximal medial tibia is subperiosteally elevated to the joint line with a knife and into the semimembranosus bursa with a Cobb  elevator. Soft tissue over the proximal lateral tibia is elevated with attention being paid to avoiding the patellar tendon on the tibial tubercle. The patella is everted, knee flexed 90 degrees and the ACL and PCL are removed. Findings are exposed bone all 3 compartments with large global osteophytes.        The drill is used to create a starting hole in the distal femur and the canal is thoroughly irrigated with sterile saline to remove the fatty contents. The 5 degree Left  valgus alignment guide is placed into the femoral canal and the distal femoral cutting block is pinned to remove 10 mm off the distal femur. Resection is made with an oscillating saw.      The tibia is subluxed forward and the menisci are removed. The extramedullary alignment guide is placed referencing proximally at the medial aspect of the tibial tubercle and distally along the second metatarsal axis and tibial crest. The block is pinned to remove 79mm off the more deficient lateral  side. Resection is made with an oscillating saw. Size 8is the most appropriate size for the tibia and the proximal tibia is prepared with the modular drill and keel punch for that size.      The femoral sizing guide is placed and size 7 is most appropriate. Rotation is marked off the epicondylar axis and confirmed by creating a rectangular flexion gap at 90 degrees. The size 7 cutting block is pinned in this rotation and the anterior, posterior and chamfer cuts are made with the oscillating saw. The intercondylar block is then placed and that cut is made.  Trial size 8 tibial component, trial size 7 posterior stabilized femur and a 10  mm posterior stabilized rotating platform insert trial is placed. Full extension is achieved with excellent varus/valgus and anterior/posterior balance throughout full range of motion. The patella is everted and thickness measured to be 27  mm. Free hand resection is taken to 15 mm, a 38 template is placed, lug holes are  drilled, trial patella is placed, and it tracks normally. Osteophytes are removed off the posterior femur with the trial in place. All trials are removed and the cut bone surfaces prepared with pulsatile lavage. Cement is mixed and once ready for implantation, the size 8 tibial implant, size  7 posterior stabilized femoral component, and the size 38 patella are cemented in place and the patella is held with the clamp. The trial insert is placed and the knee held in full extension. The Exparel (20 ml mixed with 30 ml saline) and .25% Bupivicaine, are injected into the extensor mechanism, posterior capsule, medial and lateral gutters and subcutaneous tissues.  All extruded cement is removed and once the cement is hard the permanent 10 mm posterior stabilized rotating platform insert is placed into the tibial tray.      The wound is copiously irrigated with saline solution and the extensor mechanism closed over a hemovac drain with #1 V-loc suture. The tourniquet is released for a total tourniquet time of 41  minutes. Flexion against gravity is 140 degrees and the patella tracks normally. Subcutaneous tissue is closed with 2.0 vicryl and subcuticular with running 4.0 Monocryl. The incision is cleaned and dried and steri-strips and a bulky sterile dressing are applied. The limb is placed into a knee immobilizer and the patient is awakened and transported to recovery in stable condition.      Please note that a surgical assistant was a medical necessity for this procedure in order to perform it in a safe and expeditious manner. Surgical assistant was necessary to retract the ligaments and vital neurovascular structures to prevent injury to them and also necessary for proper positioning of the limb to allow for anatomic placement of the prosthesis.   Daniel Plover Franki Alcaide, MD    12/08/2016, 8:13 AM

## 2016-12-08 NOTE — Progress Notes (Signed)
Block placed by Dr Kalman Shan, VS per flow sheet, pt tolerated well.

## 2016-12-08 NOTE — Anesthesia Procedure Notes (Signed)
Spinal  Patient location during procedure: OR Staffing Anesthesiologist: Hilaria Titsworth Performed: anesthesiologist  Preanesthetic Checklist Completed: patient identified, site marked, surgical consent, pre-op evaluation, timeout performed, IV checked, risks and benefits discussed and monitors and equipment checked Spinal Block Patient position: sitting Prep: Betadine Patient monitoring: heart rate, continuous pulse ox and blood pressure Injection technique: single-shot Needle Needle type: Sprotte  Needle gauge: 24 G Needle length: 9 cm Additional Notes Expiration date of kit checked and confirmed. Patient tolerated procedure well, without complications.       

## 2016-12-08 NOTE — Anesthesia Procedure Notes (Signed)
Anesthesia Regional Block: Adductor canal block   Pre-Anesthetic Checklist: ,, timeout performed, Correct Patient, Correct Site, Correct Laterality, Correct Procedure, Correct Position, site marked, Risks and benefits discussed,  Surgical consent,  Pre-op evaluation,  At surgeon's request and post-op pain management  Laterality: Left  Prep: chloraprep       Needles:  Injection technique: Single-shot  Needle Type: Echogenic Needle     Needle Length: 9cm      Additional Needles:   Procedures: ultrasound guided,,,,,,,,  Narrative:  Start time: 12/08/2016 9:00 AM End time: 12/08/2016 9:15 AM Injection made incrementally with aspirations every 5 mL.  Performed by: Personally  Anesthesiologist: Rowyn Mustapha  Additional Notes: Patient tolerated the procedure well without complications

## 2016-12-09 LAB — CBC
HCT: 39.5 % (ref 39.0–52.0)
Hemoglobin: 13 g/dL (ref 13.0–17.0)
MCH: 29.6 pg (ref 26.0–34.0)
MCHC: 32.9 g/dL (ref 30.0–36.0)
MCV: 90 fL (ref 78.0–100.0)
Platelets: 356 10*3/uL (ref 150–400)
RBC: 4.39 MIL/uL (ref 4.22–5.81)
RDW: 13.6 % (ref 11.5–15.5)
WBC: 19.5 10*3/uL — ABNORMAL HIGH (ref 4.0–10.5)

## 2016-12-09 LAB — BASIC METABOLIC PANEL
Anion gap: 6 (ref 5–15)
BUN: 12 mg/dL (ref 6–20)
CO2: 26 mmol/L (ref 22–32)
Calcium: 8.4 mg/dL — ABNORMAL LOW (ref 8.9–10.3)
Chloride: 107 mmol/L (ref 101–111)
Creatinine, Ser: 0.77 mg/dL (ref 0.61–1.24)
GFR calc Af Amer: >60 mL/min (ref >60)
GFR calc non Af Amer: >60 mL/min (ref >60)
Glucose, Bld: 118 mg/dL — ABNORMAL HIGH (ref 65–99)
Potassium: 4.3 mmol/L (ref 3.5–5.1)
Sodium: 139 mmol/L (ref 135–145)

## 2016-12-09 LAB — GLUCOSE, CAPILLARY
Glucose-Capillary: 98 mg/dL (ref 65–99)
Glucose-Capillary: 133 mg/dL — ABNORMAL HIGH (ref 65–99)
Glucose-Capillary: 126 mg/dL — ABNORMAL HIGH (ref 65–99)

## 2016-12-09 MED ORDER — OXYCODONE HCL 10 MG PO TABS: 10.0000 mg | ORAL_TABLET | ORAL | 0 refills | Status: DC | PRN

## 2016-12-09 MED ORDER — GABAPENTIN 300 MG PO CAPS: 300.0000 mg | ORAL_CAPSULE | Freq: Three times a day (TID) | ORAL | 0 refills | Status: DC

## 2016-12-09 MED ORDER — TRAMADOL HCL 50 MG PO TABS: 50.0000 mg | ORAL_TABLET | Freq: Four times a day (QID) | ORAL | 0 refills | Status: DC | PRN

## 2016-12-09 MED ORDER — METHOCARBAMOL 500 MG PO TABS: 500.0000 mg | ORAL_TABLET | Freq: Four times a day (QID) | ORAL | 0 refills | Status: DC | PRN

## 2016-12-09 MED ORDER — TOPIRAMATE 25 MG PO TABS: 25.0000 mg | ORAL_TABLET | Freq: Every day | ORAL | Status: DC | PRN

## 2016-12-09 MED ORDER — RIVAROXABAN 10 MG PO TABS: 10.0000 mg | ORAL_TABLET | Freq: Every day | ORAL | 0 refills | Status: DC

## 2016-12-09 MED ORDER — BISACODYL 5 MG PO TBEC
5.0000 mg | DELAYED_RELEASE_TABLET | Freq: Every day | ORAL | Status: DC | PRN
  Administered 2016-12-09: 5 mg via ORAL
  Filled 2016-12-09: qty 1

## 2016-12-09 NOTE — Care Management Note (Signed)
Case Management Note  Patient Details  Name: Daniel J Collings, MD MRN: [id number] Date of Birth: 10/18/1960  Subjective/Objective:                  LEFT TOTAL KNEE ARTHROPLASTY (Left) Action/Plan: Discharge planning Expected Discharge Date:  12/09/16               Expected Discharge Plan:  Home/Self Care  In-House Referral:     Discharge planning Services  CM Consult  Post Acute Care Choice:    Choice offered to:  Patient  DME Arranged:  3-N-1, Walker rolling DME Agency:  [facility]  HH Arranged:  NA HH Agency:  NA  Status of Service:  Completed, signed off  If discussed at Long Length of Stay Meetings, dates discussed:    Additional Comments: Cm met with pt in room to confirm plan is for outpt PT.  Pt confirms.  Cm notified [facility] rep, Kimberly to please delive rthe rolling walker and 3n1 to room prior to discharge. No other Cm needs were communicated. ,  Christine, RN 12/09/2016, 8:54 AM  

## 2016-12-09 NOTE — Progress Notes (Signed)
Physical Therapy Treatment Patient Details Name: Daniel KAMPFER, MD MRN: [id number] DOB: [birth year] Today's Date: 12/09/2016    History of Present Illness L TKA    PT Comments    Pt progressing well with mobility, he ambulated 260' with RW and performed TKA exercises with min A.    Follow Up Recommendations  Outpatient PT     Equipment Recommendations  Rolling walker with 5" wheels;3in1 (PT)    Recommendations for Other Services       Precautions / Restrictions Precautions Precautions: Knee Precaution Comments: reviewed no pillow under knee Required Braces or Orthoses: Knee Immobilizer - Left Knee Immobilizer - Left: Discontinue once straight leg raise with < 10 degree lag Restrictions Weight Bearing Restrictions: No Other Position/Activity Restrictions: wbat    Mobility  Bed Mobility         Supine to sit: Min assist     General bed mobility comments: up in chair  Transfers   Equipment used: Rolling walker (2 wheeled) Transfers: Sit to/from Stand Sit to Stand: Supervision         General transfer comment: supervision for safety, good placement of hands/LLE  Ambulation/Gait Ambulation/Gait assistance: Supervision Ambulation Distance (Feet): 260 Feet Assistive device: Rolling walker (2 wheeled) Gait Pattern/deviations: Step-to pattern;Antalgic;Decreased weight shift to left;Trunk flexed   Gait velocity interpretation: Below normal speed for age/gender General Gait Details: steady, no LOB, VCs for posture and L heel strike   Stairs            Wheelchair Mobility    Modified Rankin (Stroke Patients Only)       Balance Overall balance assessment: Modified Independent                                  Cognition Arousal/Alertness: Awake/alert Behavior During Therapy: WFL for tasks assessed/performed Overall Cognitive Status: Within Functional Limits for tasks assessed                      Exercises Total Joint  Exercises Ankle Circles/Pumps: AROM;Both;10 reps Quad Sets: AROM;Left;5 reps;Supine Short Arc Quad: AAROM;Left;10 reps;Supine Heel Slides: AAROM;Left;10 reps;Supine Hip ABduction/ADduction: AAROM;Left;10 reps;Supine Straight Leg Raises: AAROM;Left;Supine;10 reps Goniometric ROM: 5-50* AAROM L knee    General Comments        Pertinent Vitals/Pain Pain Score: 6  Pain Location: L knee with weight bearing Pain Descriptors / Indicators: Sore Pain Intervention(s): Limited activity within patient's tolerance;Monitored during session;Premedicated before session;Ice applied    Home Living Family/patient expects to be discharged to:: Private residence Living Arrangements: Spouse/significant other Available Help at Discharge: Family;Available PRN/intermittently Type of Home: House       Home Equipment: None      Prior Function Level of Independence: Independent          PT Goals (current goals can now be found in the care plan section) Acute Rehab PT Goals Patient Stated Goal: walk without pain (wants to parachute and ski but Dr Wynelle Link said no) PT Goal Formulation: With patient/family Time For Goal Achievement: 12/12/16 Potential to Achieve Goals: Good Progress towards PT goals: Progressing toward goals    Frequency    7X/week      PT Plan Current plan remains appropriate    Co-evaluation             End of Session Equipment Utilized During Treatment: Gait belt Activity Tolerance: Patient tolerated treatment well Patient left: in chair;with call bell/phone  within reach Nurse Communication: Mobility status PT Visit Diagnosis: Muscle weakness (generalized) (M62.81);Difficulty in walking, not elsewhere classified (R26.2);Pain Pain - Right/Left: Left Pain - part of body: Knee     Time: 1128-1223 PT Time Calculation (min) (ACUTE ONLY): 55 min  Charges:  $Gait Training: 23-37 mins $Therapeutic Exercise: 23-37 mins                    G Codes:        Philomena Doheny 12/09/2016, 12:50 PM [phone]

## 2016-12-09 NOTE — Progress Notes (Signed)
   Subjective: 1 Day Post-Op Procedure(s) (LRB): LEFT TOTAL KNEE ARTHROPLASTY (Left) Patient reports pain as moderate.   Patient seen in rounds for Dr. Wynelle Link. Tough night with pain. Little better this morning. Patient is well, but has had some minor complaints of pain in the knee, requiring pain medications We will start therapy today.  If they do well with therapy and meets all goals, then will allow home later this afternoon following therapy. Plan is to go Home after hospital stay. Plans for outpatient.  Already setup preop  Objective: Vital signs in last 24 hours: Temp:  [97.4 F (36.3 C)-99.5 F (37.5 C)] 98.3 F (36.8 C) (02/27 0608) Pulse Rate:  [66-93] 88 (02/27 0608) Resp:  [14-24] 16 (02/27 0608) BP: (103-154)/(54-92) 154/77 (02/27 0608) SpO2:  [95 %-100 %] 99 % (02/27 0608)  Intake/Output from previous day:  Intake/Output Summary (Last 24 hours) at 12/09/16 0832 Last data filed at 12/09/16 0700  Gross per 24 hour  Intake          4316.67 ml  Output             2650 ml  Net          1666.67 ml    Intake/Output this shift: No intake/output data recorded.  Labs:  Recent Labs  12/09/16 0405  HGB 13.0    Recent Labs  12/09/16 0405  WBC 19.5*  RBC 4.39  HCT 39.5  PLT 356    Recent Labs  12/09/16 0405  NA 139  K 4.3  CL 107  CO2 26  BUN 12  CREATININE 0.77  GLUCOSE 118*  CALCIUM 8.4*    Recent Labs  12/08/16 0549  INR 1.05    EXAM General - Patient is Alert, Appropriate and Oriented Extremity - Neurovascular intact Sensation intact distally Intact pulses distally Dorsiflexion/Plantar flexion intact Dressing - dressing C/D/I Motor Function - intact, moving foot and toes well on exam.  Hemovac pulled without difficulty.  Past Medical History:  Diagnosis Date  . Arthritis   . BPH (benign prostatic hyperplasia)   . Bronchitis   . DDD (degenerative disc disease), cervical   . Diabetes mellitus without complication ([place])    type 2    . DJD (degenerative joint disease)   . Headache    migraines  . Pneumonia   . Sinusitis     Assessment/Plan: 1 Day Post-Op Procedure(s) (LRB): LEFT TOTAL KNEE ARTHROPLASTY (Left) Principal Problem:   Primary osteoarthritis of left knee Active Problems:   OA (osteoarthritis) of knee  Estimated body mass index is 38.25 kg/m as calculated from the following:   Height as of this encounter: 6' (1.829 m).   Weight as of this encounter: 127.9 kg (282 lb). Up with therapy Discharge home - straight to outpatient therapy (already setup)  DVT Prophylaxis - Xarelto Weight-Bearing as tolerated to left leg D/C O2 and Pulse OX and try on Room Air  If meets goals and able to go home: Up with therapy Diet - Diabetic diet Follow up - in 2 weeks Activity - WBAT Disposition - Home Condition Upon Discharge - pending, home if improved D/C Meds - See DC Summary DVT Prophylaxis - [place], PA-C Orthopaedic Surgery

## 2016-12-09 NOTE — Consult Note (Signed)
   [facility] CM Inpatient Consult   12/09/2016  [name], MD 10-Jun-1961 [id number]   Came to visit Dr. Sherral Hammers on behalf of [name]. However, upon knocking on door, he said it was not a good time to come in. Noted to be discharge today.   [name], MSN-Ed, RN,BSN [facility] Liaison [phone]

## 2016-12-09 NOTE — Discharge Instructions (Addendum)
° °Dr. Frank Aluisio °Total Joint Specialist °[facility] °[address] °[place], [place] [address] °[phone] ° °TOTAL KNEE REPLACEMENT POSTOPERATIVE DIRECTIONS ° °Knee Rehabilitation, Guidelines Following Surgery  °Results after knee surgery are often greatly improved when you follow the exercise, range of motion and muscle strengthening exercises prescribed by your doctor. Safety measures are also important to protect the knee from further injury. Any time any of these exercises cause you to have increased pain or swelling in your knee joint, decrease the amount until you are comfortable again and slowly increase them. If you have problems or questions, call your caregiver or physical therapist for advice.  ° °HOME CARE INSTRUCTIONS  °Remove items at home which could result in a fall. This includes throw rugs or furniture in walking pathways.  °· ICE to the affected knee every three hours for 30 minutes at a time and then as needed for pain and swelling.  Continue to use ice on the knee for pain and swelling from surgery. You may notice swelling that will progress down to the foot and ankle.  This is normal after surgery.  Elevate the leg when you are not up walking on it.   °· Continue to use the breathing machine which will help keep your temperature down.  It is common for your temperature to cycle up and down following surgery, especially at night when you are not up moving around and exerting yourself.  The breathing machine keeps your lungs expanded and your temperature down. °· Do not place pillow under knee, focus on keeping the knee straight while resting ° °DIET °You may resume your previous home diet once your are discharged from the hospital. ° °DRESSING / WOUND CARE / SHOWERING °You may shower 3 days after surgery, but keep the wounds dry during showering.  You may use an occlusive plastic wrap (Press'n Seal for example), NO SOAKING/SUBMERGING IN THE BATHTUB.  If the  bandage gets wet, change with a clean dry gauze.  If the incision gets wet, pat the wound dry with a clean towel. °You may start showering once you are discharged home but do not submerge the incision under water. Just pat the incision dry and apply a dry gauze dressing on daily. °Change the surgical dressing daily and reapply a dry dressing each time. ° °ACTIVITY °Walk with your walker as instructed. °Use walker as long as suggested by your caregivers. °Avoid periods of inactivity such as sitting longer than an hour when not asleep. This helps prevent blood clots.  °You may resume a sexual relationship in one month or when given the OK by your doctor.  °You may return to work once you are cleared by your doctor.  °Do not drive a car for 6 weeks or until released by you surgeon.  °Do not drive while taking narcotics. ° °WEIGHT BEARING °Weight bearing as tolerated with assist device (walker, cane, etc) as directed, use it as long as suggested by your surgeon or therapist, typically at least 4-6 weeks. ° °POSTOPERATIVE CONSTIPATION PROTOCOL °Constipation - defined medically as fewer than three stools per week and severe constipation as less than one stool per week. ° °One of the most common issues patients have following surgery is constipation.  Even if you have a regular bowel pattern at home, your normal regimen is likely to be disrupted due to multiple reasons following surgery.  Combination of anesthesia, postoperative narcotics, change in appetite and fluid intake all can affect your bowels.    In order to avoid complications following surgery, here are some recommendations in order to help you during your recovery period. ° °Colace (docusate) - Pick up an over-the-counter form of Colace or another stool softener and take twice a day as long as you are requiring postoperative pain medications.  Take with a full glass of water daily.  If you experience loose stools or diarrhea, hold the colace until you stool forms  back up.  If your symptoms do not get better within 1 week or if they get worse, check with your doctor. ° °Dulcolax (bisacodyl) - Pick up over-the-counter and take as directed by the product packaging as needed to assist with the movement of your bowels.  Take with a full glass of water.  Use this product as needed if not relieved by Colace only.  ° °MiraLax (polyethylene glycol) - Pick up over-the-counter to have on hand.  MiraLax is a solution that will increase the amount of water in your bowels to assist with bowel movements.  Take as directed and can mix with a glass of water, juice, soda, coffee, or tea.  Take if you go more than two days without a movement. °Do not use MiraLax more than once per day. Call your doctor if you are still constipated or irregular after using this medication for 7 days in a row. ° °If you continue to have problems with postoperative constipation, please contact the office for further assistance and recommendations.  If you experience "the worst abdominal pain ever" or develop nausea or vomiting, please contact the office immediatly for further recommendations for treatment. ° °ITCHING ° If you experience itching with your medications, try taking only a single pain pill, or even half a pain pill at a time.  You can also use Benadryl over the counter for itching or also to help with sleep.  ° °TED HOSE STOCKINGS °Wear the elastic stockings on both legs for three weeks following surgery during the day but you may remove then at night for sleeping. ° °MEDICATIONS °See your medication summary on the “After Visit Summary” that the nursing staff will review with you prior to discharge.  You may have some home medications which will be placed on hold until you complete the course of blood thinner medication.  It is important for you to complete the blood thinner medication as prescribed by your surgeon.  Continue your approved medications as instructed at time of  discharge. ° °PRECAUTIONS °If you experience chest pain or shortness of breath - call 911 immediately for transfer to the hospital emergency department.  °If you develop a fever greater that 101 F, purulent drainage from wound, increased redness or drainage from wound, foul odor from the wound/dressing, or calf pain - CONTACT YOUR SURGEON.   °                                                °FOLLOW-UP APPOINTMENTS °Make sure you keep all of your appointments after your operation with your surgeon and caregivers. You should call the office at the above phone number and make an appointment for approximately two weeks after the date of your surgery or on the date instructed by your surgeon outlined in the "After Visit Summary". ° ° °RANGE OF MOTION AND STRENGTHENING EXERCISES  °Rehabilitation of the knee is important following a knee injury or   an operation. After just a few days of immobilization, the muscles of the thigh which control the knee become weakened and shrink (atrophy). Knee exercises are designed to build up the tone and strength of the thigh muscles and to improve knee motion. Often times heat used for twenty to thirty minutes before working out will loosen up your tissues and help with improving the range of motion but do not use heat for the first two weeks following surgery. These exercises can be done on a training (exercise) mat, on the floor, on a table or on a bed. Use what ever works the best and is most comfortable for you Knee exercises include:  Leg Lifts - While your knee is still immobilized in a splint or cast, you can do straight leg raises. Lift the leg to 60 degrees, hold for 3 sec, and slowly lower the leg. Repeat 10-20 times 2-3 times daily. Perform this exercise against resistance later as your knee gets better.  Quad and Hamstring Sets - Tighten up the muscle on the front of the thigh (Quad) and hold for 5-10 sec. Repeat this 10-20 times hourly. Hamstring sets are done by pushing the  foot backward against an object and holding for 5-10 sec. Repeat as with quad sets.   Leg Slides: Lying on your back, slowly slide your foot toward your buttocks, bending your knee up off the floor (only go as far as is comfortable). Then slowly slide your foot back down until your leg is flat on the floor again.  Angel Wings: Lying on your back spread your legs to the side as far apart as you can without causing discomfort.  A rehabilitation program following serious knee injuries can speed recovery and prevent re-injury in the future due to weakened muscles. Contact your doctor or a physical therapist for more information on knee rehabilitation.   IF YOU ARE TRANSFERRED TO A SKILLED REHAB FACILITY If the patient is transferred to a skilled rehab facility following release from the hospital, a list of the current medications will be sent to the facility for the patient to continue.  When discharged from the skilled rehab facility, please have the facility set up the patient's [place] prior to being released. Also, the skilled facility will be responsible for providing the patient with their medications at time of release from the facility to include their pain medication, the muscle relaxants, and their blood thinner medication. If the patient is still at the rehab facility at time of the two week follow up appointment, the skilled rehab facility will also need to assist the patient in arranging follow up appointment in our office and any transportation needs.  MAKE SURE YOU:  Understand these instructions.  Get help right away if you are not doing well or get worse.    Pick up stool softner and laxative for home use following surgery while on pain medications. Do not submerge incision under water. Please use good hand washing techniques while changing dressing each day. May shower starting three days after surgery. Please use a clean towel to pat the incision dry following  showers. Continue to use ice for pain and swelling after surgery. Do not use any lotions or creams on the incision until instructed by your surgeon.  Take Xarelto for two and a half more weeks following discharge from the hospital, then discontinue Xarelto. Once the patient has completed the Xarelto, they may resume the 325 mg Aspirin.  Gabapentin 300 mg Protocol  Take a 300 mg capsule three times a day for one week, Then a 300 mg capsule twice a day for one week, Then a 300 mg capsule once a day for one week, then discontinue the Gabapentin.  Information on my medicine - XARELTO (Rivaroxaban)  This medication education was reviewed with me or my healthcare representative as part of my discharge preparation.  The pharmacist that spoke with me during my hospital stay was:  Biagio Borg, [facility]  Why was Xarelto prescribed for you? Xarelto was prescribed for you to reduce the risk of blood clots forming after orthopedic surgery. The medical term for these abnormal blood clots is venous thromboembolism (VTE).  What do you need to know about xarelto ? Take your Xarelto ONCE DAILY at the same time every day. You may take it either with or without food.  If you have difficulty swallowing the tablet whole, you may crush it and mix in applesauce just prior to taking your dose.  Take Xarelto exactly as prescribed by your doctor and DO NOT stop taking Xarelto without talking to the doctor who prescribed the medication.  Stopping without other VTE prevention medication to take the place of Xarelto may increase your risk of developing a clot.  After discharge, you should have regular check-up appointments with your healthcare provider that is prescribing your Xarelto.    What do you do if you miss a dose? If you miss a dose, take it as soon as you remember on the same day then continue your regularly scheduled once daily regimen the next day. Do not take two doses of Xarelto on  the same day.   Important Safety Information A possible side effect of Xarelto is bleeding. You should call your healthcare provider right away if you experience any of the following: ? Bleeding from an injury or your nose that does not stop. ? Unusual colored urine (red or dark brown) or unusual colored stools (red or black). ? Unusual bruising for unknown reasons. ? A serious fall or if you hit your head (even if there is no bleeding).  Some medicines may interact with Xarelto and might increase your risk of bleeding while on Xarelto. To help avoid this, consult your healthcare provider or pharmacist prior to using any new prescription or non-prescription medications, including herbals, vitamins, non-steroidal anti-inflammatory drugs (NSAIDs) and supplements.  This website has more information on Xarelto: [email or web address]

## 2016-12-09 NOTE — Progress Notes (Signed)
Physical Therapy Treatment Patient Details Name: Daniel COOPRIDER, MD MRN: [id number] DOB: [birth year] Today's Date: 12/09/2016    History of Present Illness L TKA    PT Comments    Excellent progress with mobility, pt ambulated 350' with RW and L KI with supervision. Stair training completed with pt's daughter assisting. Progressed HEP, L knee flexion AAROM 90*. Pt very motivated.   Follow Up Recommendations  Outpatient PT     Equipment Recommendations  Rolling walker with 5" wheels;3in1 (PT)    Recommendations for Other Services       Precautions / Restrictions Precautions Precautions: Knee Precaution Comments: reviewed no pillow under knee Required Braces or Orthoses: Knee Immobilizer - Left Knee Immobilizer - Left: Discontinue once straight leg raise with < 10 degree lag Restrictions Weight Bearing Restrictions: No Other Position/Activity Restrictions: wbat    Mobility  Bed Mobility   Bed Mobility: Sit to Supine       Sit to supine: Min guard   General bed mobility comments: used leg lifter  Transfers   Equipment used: Rolling walker (2 wheeled) Transfers: Sit to/from Stand Sit to Stand: Modified independent (Device/Increase time)         General transfer comment:  good placement of hands/LLE  Ambulation/Gait Ambulation/Gait assistance: Supervision Ambulation Distance (Feet): 350 Feet Assistive device: Rolling walker (2 wheeled) Gait Pattern/deviations: Step-to pattern;Antalgic;Decreased weight shift to left   Gait velocity interpretation: Below normal speed for age/gender General Gait Details: steady, no LOB, VCs for posture and L heel strike, wore L KI   Stairs Stairs: Yes   Stair Management: No rails;Backwards;Step to pattern Number of Stairs: 5 General stair comments: min A to steady RW with pt's daughter assisting, VCs sequencing  Wheelchair Mobility    Modified Rankin (Stroke Patients Only)       Balance Overall balance  assessment: Modified Independent                                  Cognition Arousal/Alertness: Awake/alert Behavior During Therapy: WFL for tasks assessed/performed Overall Cognitive Status: Within Functional Limits for tasks assessed                      Exercises Total Joint Exercises Heel Slides: AAROM;Left;10 reps;Supine Long Arc Quad: AROM;Left;5 reps;Seated Knee Flexion: AAROM;Left;5 reps;Seated Goniometric ROM: 5-90* AAROM L knee    General Comments        Pertinent Vitals/Pain Pain Score: 8  Pain Location: L knee with weight bearing Pain Descriptors / Indicators: Sore Pain Intervention(s): Limited activity within patient's tolerance;Monitored during session;Premedicated before session;Ice applied    Home Living                      Prior Function            PT Goals (current goals can now be found in the care plan section) Acute Rehab PT Goals Patient Stated Goal: walk without pain (wants to parachute and ski but Dr Wynelle Link said no) PT Goal Formulation: With patient/family Time For Goal Achievement: 12/12/16 Potential to Achieve Goals: Good Progress towards PT goals: Progressing toward goals    Frequency    7X/week      PT Plan Current plan remains appropriate    Co-evaluation             End of Session Equipment Utilized During Treatment: Gait belt Activity Tolerance: Patient tolerated treatment  well Patient left: in chair;with call bell/phone within reach Nurse Communication: Mobility status PT Visit Diagnosis: Muscle weakness (generalized) (M62.81);Difficulty in walking, not elsewhere classified (R26.2);Pain Pain - Right/Left: Left Pain - part of body: Knee     Time: 1455-1600 PT Time Calculation (min) (ACUTE ONLY): 65 min  Charges:  $Gait Training: 23-37 mins $Therapeutic Exercise: 23-37 mins                    G Codes:       Philomena Doheny 12/09/2016, 5:26 PM [phone]

## 2016-12-09 NOTE — Discharge Summary (Signed)
Physician Discharge Summary   Patient ID: [name], [name] MRN: [id number] DOB/AGE: 57/08/1961 57 y.o.  Admit date: 12/08/2016 Discharge date: 12/10/2016  Primary Diagnosis:  Osteoarthritis  Left knee(s) Admission Diagnoses:  Past Medical History:  Diagnosis Date  . Arthritis   . BPH (benign prostatic hyperplasia)   . Bronchitis   . DDD (degenerative disc disease), cervical   . Diabetes mellitus without complication ([place])    type 2  . DJD (degenerative joint disease)   . Headache    migraines  . Pneumonia   . Sinusitis    Discharge Diagnoses:   Principal Problem:   Primary osteoarthritis of left knee Active Problems:   OA (osteoarthritis) of knee  Estimated body mass index is 38.25 kg/m as calculated from the following:   Height as of this encounter: 6' (1.829 m).   Weight as of this encounter: 127.9 kg (282 lb).  Procedure:  Procedure(s) (LRB): LEFT TOTAL KNEE ARTHROPLASTY (Left)   Consults: None  HPI: [name], [name] is a 56 y.o. year old male with end stage OA of his left knee with progressively worsening pain and dysfunction. He has constant pain, with activity and at rest and significant functional deficits with difficulties even with ADLs. He has had extensive non-op management including analgesics, injections of cortisone and viscosupplements, and home exercise program, but remains in significant pain with significant dysfunction. Radiographs show bone on bone arthritis lateral and patellofemoral. He presents now for left Total Knee Arthroplasty.  Laboratory Data: Admission on 12/08/2016  Component Date Value Ref Range Status  . Glucose-Capillary 12/08/2016 96  65 - 99 mg/dL Final  . Comment 1 12/08/2016 Notify RN   Final  . aPTT 12/08/2016 28  24 - 36 seconds Final  . Prothrombin Time 12/08/2016 13.7  11.4 - 15.2 seconds Final  . INR 12/08/2016 1.05   Final  . Glucose-Capillary 12/08/2016 86  65 - 99 mg/dL Final  . Glucose-Capillary 12/08/2016 147* 65 -  99 mg/dL Final  . WBC 12/09/2016 19.5* 4.0 - 10.5 K/uL Final  . RBC 12/09/2016 4.39  4.22 - 5.81 MIL/uL Final  . Hemoglobin 12/09/2016 13.0  13.0 - 17.0 g/dL Final  . HCT 12/09/2016 39.5  39.0 - 52.0 % Final  . MCV 12/09/2016 90.0  78.0 - 100.0 fL Final  . MCH 12/09/2016 29.6  26.0 - 34.0 pg Final  . MCHC 12/09/2016 32.9  30.0 - 36.0 g/dL Final  . RDW 12/09/2016 13.6  11.5 - 15.5 % Final  . Platelets 12/09/2016 356  150 - 400 K/uL Final  . Sodium 12/09/2016 139  135 - 145 mmol/L Final  . Potassium 12/09/2016 4.3  3.5 - 5.1 mmol/L Final  . Chloride 12/09/2016 107  101 - 111 mmol/L Final  . CO2 12/09/2016 26  22 - 32 mmol/L Final  . Glucose, Bld 12/09/2016 118* 65 - 99 mg/dL Final  . BUN 12/09/2016 12  6 - 20 mg/dL Final  . Creatinine, Ser 12/09/2016 0.77  0.61 - 1.24 mg/dL Final  . Calcium 12/09/2016 8.4* 8.9 - 10.3 mg/dL Final  . GFR calc non Af Amer 12/09/2016 >60  >60 mL/min Final  . GFR calc Af Amer 12/09/2016 >60  >60 mL/min Final   Comment: (NOTE) The eGFR has been calculated using the CKD EPI equation. This calculation has not been validated in all clinical situations. eGFR's persistently <60 mL/min signify possible Chronic Kidney Disease.   . Anion gap 12/09/2016 6  5 - 15 Final  .  Glucose-Capillary 12/08/2016 134* 65 - 99 mg/dL Final  . Glucose-Capillary 12/08/2016 138* 65 - 99 mg/dL Final  . Glucose-Capillary 12/09/2016 98  65 - 99 mg/dL Final  Hospital Outpatient Visit on 11/28/2016  Component Date Value Ref Range Status  . Glucose-Capillary 11/28/2016 101* 65 - 99 mg/dL Final  . WBC 11/28/2016 14.1* 4.0 - 10.5 K/uL Final  . RBC 11/28/2016 4.76  4.22 - 5.81 MIL/uL Final  . Hemoglobin 11/28/2016 14.5  13.0 - 17.0 g/dL Final  . HCT 11/28/2016 42.7  39.0 - 52.0 % Final  . MCV 11/28/2016 89.7  78.0 - 100.0 fL Final  . MCH 11/28/2016 30.5  26.0 - 34.0 pg Final  . MCHC 11/28/2016 34.0  30.0 - 36.0 g/dL Final  . RDW 11/28/2016 13.7  11.5 - 15.5 % Final  . Platelets  11/28/2016 314  150 - 400 K/uL Final  . Sodium 11/28/2016 141  135 - 145 mmol/L Final  . Potassium 11/28/2016 4.1  3.5 - 5.1 mmol/L Final  . Chloride 11/28/2016 107  101 - 111 mmol/L Final  . CO2 11/28/2016 29  22 - 32 mmol/L Final  . Glucose, Bld 11/28/2016 105* 65 - 99 mg/dL Final  . BUN 11/28/2016 14  6 - 20 mg/dL Final  . Creatinine, Ser 11/28/2016 1.13  0.61 - 1.24 mg/dL Final  . Calcium 11/28/2016 9.2  8.9 - 10.3 mg/dL Final  . Total Protein 11/28/2016 7.2  6.5 - 8.1 g/dL Final  . Albumin 11/28/2016 4.1  3.5 - 5.0 g/dL Final  . AST 11/28/2016 26  15 - 41 U/L Final  . ALT 11/28/2016 25  17 - 63 U/L Final  . Alkaline Phosphatase 11/28/2016 61  38 - 126 U/L Final  . Total Bilirubin 11/28/2016 0.8  0.3 - 1.2 mg/dL Final  . GFR calc non Af Amer 11/28/2016 >60  >60 mL/min Final  . GFR calc Af Amer 11/28/2016 >60  >60 mL/min Final   Comment: (NOTE) The eGFR has been calculated using the CKD EPI equation. This calculation has not been validated in all clinical situations. eGFR's persistently <60 mL/min signify possible Chronic Kidney Disease.   . Anion gap 11/28/2016 5  5 - 15 Final  . ABO/RH(D) 11/28/2016 O NEG   Final  . Antibody Screen 11/28/2016 NEG   Final  . Sample Expiration 11/28/2016 12/11/2016   Final  . Extend sample reason 11/28/2016 NO TRANSFUSIONS OR PREGNANCY IN THE PAST 3 MONTHS   Final  . MRSA, PCR 11/28/2016 NEGATIVE  NEGATIVE Final  . Staphylococcus aureus 11/28/2016 NEGATIVE  NEGATIVE Final   Comment:        The Xpert SA Assay (FDA approved for NASAL specimens in patients over 9 years of age), is one component of a comprehensive surveillance program.  Test performance has been validated by [facility] for patients greater than or equal to 18 year old. It is not intended to diagnose infection nor to guide or monitor treatment.   . ABO/RH(D) 11/28/2016 O NEG   Final     X-Rays:Dg Chest 2 View  Result Date: 12/01/2016 CLINICAL DATA:  Cough, chest  congestion, chest pain, fever, and shortness of breath for the past 5 days. Persistent symptoms despite taking a Z-Pak and Tamiflu. Nonsmoker. EXAM: CHEST  2 VIEW COMPARISON:  Chest x-ray of November 28, 2016 FINDINGS: The lungs are adequately inflated and clear. The heart and pulmonary vascularity are normal. The mediastinum is normal in width. The trachea is midline. The bony thorax exhibits  no acute abnormality. IMPRESSION: There is no pneumonia nor other acute cardiopulmonary abnormality. Electronically Signed   By: David  Martinique M.D.   On: 12/01/2016 09:15   Dg Chest 2 View  Result Date: 11/28/2016 CLINICAL DATA:  Preop evaluation for upcoming knee replacement EXAM: CHEST  2 VIEW COMPARISON:  None. FINDINGS: The heart size and mediastinal contours are within normal limits. Both lungs are clear. The visualized skeletal structures are unremarkable. IMPRESSION: No active cardiopulmonary disease. Electronically Signed   By: Inez Catalina M.D.   On: 11/28/2016 15:46    EKG: Orders placed or performed during the hospital encounter of 11/28/16  . EKG 12 lead  . EKG 12 lead     Hospital Course: [name], [name] is a 56 y.o. who was admitted to [facility]. They were brought to the operating room on 12/08/2016 and underwent Procedure(s): LEFT TOTAL KNEE ARTHROPLASTY.  Patient tolerated the procedure well and was later transferred to the recovery room and then to the orthopaedic floor for postoperative care.  They were given PO and IV analgesics for pain control following their surgery.  They were given 24 hours of postoperative antibiotics of  Anti-infectives    Start     Dose/Rate Route Frequency Ordered Stop   12/08/16 1300  ceFAZolin (ANCEF) IVPB 2g/100 mL premix     2 g 200 mL/hr over 30 Minutes Intravenous Every 6 hours 12/08/16 1000 12/08/16 2116   12/08/16 0600  ceFAZolin (ANCEF) 3 g in dextrose 5 % 50 mL IVPB     3 g 130 mL/hr over 30 Minutes Intravenous On call to O.R. 12/07/16  1506 12/08/16 0710     and started on DVT prophylaxis in the form of Xarelto.   PT and OT were ordered for total joint protocol.  Discharge planning consulted to help with postop disposition and equipment needs.  Patient had a tough night on the evening of surgery but doing better the next morning.  They started to get up OOB with therapy on day one. Hemovac drain was pulled without difficulty.   POD 2 - Continued to work with therapy into day two.  Dressing was changed on day two and the incision was healing well.  Patient was seen in rounds and was ready to go home later that same day.  Discharge home - straight to outpatient therapy (already setup) Diet - Diabetic diet Follow up - in 2 weeks Activity - WBAT Disposition - Home Condition Upon Discharge - improved D/C Meds - See DC Summary DVT Prophylaxis - Xarelto   Discharge Instructions    Call [name] / Call 911    Complete by:  As directed    If you experience chest pain or shortness of breath, CALL 911 and be transported to the hospital emergency room.  If you develope a fever above 101 F, pus (white drainage) or increased drainage or redness at the wound, or calf pain, call your surgeon's office.   Change dressing    Complete by:  As directed    Change dressing daily with sterile 4 x 4 inch gauze dressing and apply TED hose. Do not submerge the incision under water.   Constipation Prevention    Complete by:  As directed    Drink plenty of fluids.  Prune juice may be helpful.  You may use a stool softener, such as Colace (over the counter) 100 mg twice a day.  Use MiraLax (over the counter) for constipation as needed.  Diet Carb Modified    Complete by:  As directed    Discharge instructions    Complete by:  As directed    Pick up stool softner and laxative for home use following surgery while on pain medications. Do not submerge incision under water. Please use good hand washing techniques while changing dressing each day. May  shower starting three days after surgery. Please use a clean towel to pat the incision dry following showers. Continue to use ice for pain and swelling after surgery. Do not use any lotions or creams on the incision until instructed by your surgeon.  Wear both TED hose on both legs during the day every day for three weeks, but may have off at night at home.  Postoperative Constipation Protocol  Constipation - defined medically as fewer than three stools per week and severe constipation as less than one stool per week.  One of the most common issues patients have following surgery is constipation.  Even if you have a regular bowel pattern at home, your normal regimen is likely to be disrupted due to multiple reasons following surgery.  Combination of anesthesia, postoperative narcotics, change in appetite and fluid intake all can affect your bowels.  In order to avoid complications following surgery, here are some recommendations in order to help you during your recovery period.  Colace (docusate) - Pick up an over-the-counter form of Colace or another stool softener and take twice a day as long as you are requiring postoperative pain medications.  Take with a full glass of water daily.  If you experience loose stools or diarrhea, hold the colace until you stool forms back up.  If your symptoms do not get better within 1 week or if they get worse, check with your doctor.  Dulcolax (bisacodyl) - Pick up over-the-counter and take as directed by the product packaging as needed to assist with the movement of your bowels.  Take with a full glass of water.  Use this product as needed if not relieved by Colace only.   MiraLax (polyethylene glycol) - Pick up over-the-counter to have on hand.  MiraLax is a solution that will increase the amount of water in your bowels to assist with bowel movements.  Take as directed and can mix with a glass of water, juice, soda, coffee, or tea.  Take if you go more than two  days without a movement. Do not use MiraLax more than once per day. Call your doctor if you are still constipated or irregular after using this medication for 7 days in a row.  If you continue to have problems with postoperative constipation, please contact the office for further assistance and recommendations.  If you experience "the worst abdominal pain ever" or develop nausea or vomiting, please contact the office immediatly for further recommendations for treatment.   Take Xarelto for two and a half more weeks, then discontinue Xarelto. Once the patient has completed the Xarelto, they may resume the 325 mg Aspirin.   Do not put a pillow under the knee. Place it under the heel.    Complete by:  As directed    Do not sit on low chairs, stoools or toilet seats, as it may be difficult to get up from low surfaces    Complete by:  As directed    Driving restrictions    Complete by:  As directed    No driving until released by the physician.   Increase activity slowly as tolerated  Complete by:  As directed    Lifting restrictions    Complete by:  As directed    No lifting until released by the physician.   Patient may shower    Complete by:  As directed    You may shower without a dressing once there is no drainage.  Do not wash over the wound.  If drainage remains, do not shower until drainage stops.   TED hose    Complete by:  As directed    Use stockings (TED hose) for 3 weeks on both leg(s).  You may remove them at night for sleeping.   Weight bearing as tolerated    Complete by:  As directed    Laterality:  left   Extremity:  Lower     Allergies as of 12/09/2016      Reactions   Benzoin Compound    Blisters       Medication List    STOP taking these medications   amoxicillin-clavulanate 875-125 MG tablet Commonly known as:  AUGMENTIN   azithromycin 500 MG tablet Commonly known as:  ZITHROMAX   celecoxib 200 MG capsule Commonly known as:  CELEBREX   oseltamivir 75  MG capsule Commonly known as:  TAMIFLU   OVER THE COUNTER MEDICATION   oxycodone 5 MG capsule Commonly known as:  OXY-IR Replaced by:  Oxycodone HCl 10 MG Tabs     TAKE these medications   baclofen 10 MG tablet Commonly known as:  LIORESAL Take 10 mg by mouth. Take 1-2 tablets by mouth twice a day as needed.   butalbital-acetaminophen-caffeine 50-325-40 MG tablet Commonly known as:  FIORICET, ESGIC Take by mouth 2 (two) times daily as needed for headache.   CIALIS 5 MG tablet Generic drug:  tadalafil Take 5 mg by mouth daily.   diazepam 5 MG tablet Commonly known as:  VALIUM Take 5 mg by mouth 2 (two) times daily.   fluticasone 50 MCG/ACT nasal spray Commonly known as:  FLONASE Place into both nostrils daily.   gabapentin 300 MG capsule Commonly known as:  NEURONTIN Take 1 capsule (300 mg total) by mouth 3 (three) times daily. Take 300 mg three times a day for one week, then reduce to 300 mg twice a day for one week, then reduce to 300 mg once a day for one week, then discontinue.   guaiFENesin 200 MG tablet Take 200 mg by mouth every 4 (four) hours as needed for cough or to loosen phlegm.   guaifenesin 100 MG/5ML syrup Commonly known as:  ROBITUSSIN Take 5-10 mLs (100-200 mg total) by mouth every 4 (four) hours as needed for cough.   ketoconazole 2 % cream Commonly known as:  NIZORAL Apply 1 application topically 2 (two) times daily.   lidocaine 5 % Commonly known as:  LIDODERM Place 1 patch onto the skin daily. Remove & Discard patch within 12 hours or as directed by [name]   metFORMIN 1000 MG tablet Commonly known as:  GLUCOPHAGE Take 1,000 mg by mouth 2 (two) times daily with a meal.   methocarbamol 500 MG tablet Commonly known as:  ROBAXIN Take 1 tablet (500 mg total) by mouth every 6 (six) hours as needed for muscle spasms. What changed:  when to take this   Oxycodone HCl 10 MG Tabs Take 1-2 tablets (10-20 mg total) by mouth every 4 (four) hours as needed  for moderate pain or severe pain. Replaces:  oxycodone 5 MG capsule   rivaroxaban 10 MG Tabs  tablet Commonly known as:  XARELTO Take 1 tablet (10 mg total) by mouth daily with breakfast. Take Xarelto for two and a half more weeks following discharge from the hospital, then discontinue Xarelto. Once the patient has completed the Xarelto, they may resume the 325 mg Aspirin. Start taking on:  12/10/2016   sodium chloride 0.65 % Soln nasal spray Commonly known as:  OCEAN Place 1 spray into both nostrils as needed for congestion.   SUMAtriptan 50 MG tablet Commonly known as:  IMITREX Take 50 mg by mouth every 2 (two) hours as needed for migraine or headache. May repeat in 2 hours if headache persists or recurs.   tamsulosin 0.4 MG Caps capsule Commonly known as:  FLOMAX Take 0.4 mg by mouth daily.   topiramate 25 MG tablet Commonly known as:  TOPAMAX Take 25 mg by mouth daily.   traMADol 50 MG tablet Commonly known as:  ULTRAM Take 1-2 tablets (50-100 mg total) by mouth every 6 (six) hours as needed for moderate pain. What changed:  how much to take  when to take this   zolpidem 10 MG tablet Commonly known as:  AMBIEN Take 10 mg by mouth at bedtime as needed for sleep.      Follow-up Information    Gearlean Alf, [name]. Schedule an appointment as soon as possible for a visit on 12/23/2016.   Specialty:  Orthopedic Surgery Contact information: [address] [place] [address] [phone]           Signed: Arlee Muslim, PA-C Orthopaedic Surgery 12/09/2016, 8:41 AM

## 2016-12-09 NOTE — Progress Notes (Signed)
Contacted answering service and left message with Tamika for Dr. Wynelle Link about having Dr.Makarios Sherral Hammers bisacodyl changed from a suppository to a po med. Pt already spoke with Dr. Wynelle Link and Dian Situ about wanting the med changed this am. Awaiting new orders.  Chandra Batch RN student

## 2016-12-09 NOTE — Progress Notes (Signed)
Patient informed student upon administration of 10 am meds that he only takes the med topirimate at home if needed for migraines. Pharmacy notified of this. Spoke with Graylin Shiver and notified her of the patient's med schedule preference. Med updated in chart as PRN. Patient aware of change.  Maxie Barb, Katina Degree RN student

## 2016-12-09 NOTE — Evaluation (Signed)
Occupational Therapy Evaluation Patient Details Name: WRIGLEY LEMA, MD MRN: [id number] DOB: 1961-03-18 Today's Date: 12/09/2016    History of Present Illness L TKA   Clinical Impression   Dr Sherral Hammers was admitted for the above sx. All education was completed. No further OT is needed at this time    Follow Up Recommendations  No OT follow up    Equipment Recommendations  3 in 1 bedside commode;Other (comment) may need wide   Recommendations for Other Services       Precautions / Restrictions Precautions Precautions: Knee Precaution Comments: reviewed no pillow under knee Required Braces or Orthoses: Knee Immobilizer - Left Knee Immobilizer - Left: Discontinue once straight leg raise with < 10 degree lag Restrictions Weight Bearing Restrictions: No      Mobility Bed Mobility         Supine to sit: Min assist     General bed mobility comments: assist for LLE OOB  Transfers   Equipment used: Rolling walker (2 wheeled)   Sit to Stand: Min guard         General transfer comment: cues for UE/LE placement    Balance                                            ADL Overall ADL's : Needs assistance/impaired Eating/Feeding: Independent   Grooming: Supervision/safety;Standing   Upper Body Bathing: Set up;Sitting   Lower Body Bathing: Minimal assistance;Sit to/from stand   Upper Body Dressing : Set up;Sitting   Lower Body Dressing: Minimal assistance;Sit to/from stand   Toilet Transfer: Min guard;BSC;RW;Ambulation   Toileting- Water quality scientist and Hygiene: Min guard;Sit to/from stand     Tub/Shower Transfer Details (indicate cue type and reason): verbalizes understanding.  demonstrated, but pt with too much pain to practice   General ADL Comments: reviewed AE.  Pt is interested in reacher and leg lifter     Vision         Perception     Praxis      Pertinent Vitals/Pain Pain Score: 7  Pain Location: L knee with weight  bearing Pain Descriptors / Indicators: Aching Pain Intervention(s): Limited activity within patient's tolerance;Monitored during session;Premedicated before session;Repositioned;Ice applied     Hand Dominance     Extremity/Trunk Assessment             Communication Communication Communication: No difficulties   Cognition Arousal/Alertness: Awake/alert Behavior During Therapy: WFL for tasks assessed/performed Overall Cognitive Status: Within Functional Limits for tasks assessed                     General Comments       Exercises       Shoulder Instructions      Home Living Family/patient expects to be discharged to:: Private residence Living Arrangements: Spouse/significant other Available Help at Discharge: Family;Available PRN/intermittently Type of Home: House             Bathroom Shower/Tub: Walk-in Corporate treasurer Toilet: Standard     Home Equipment: None          Prior Functioning/Environment Level of Independence: Independent                 OT Problem List:        OT Treatment/Interventions:      OT Goals(Current goals can be found in the care  plan section) Acute Rehab OT Goals Patient Stated Goal: walk without pain (wants to parachute and ski but Dr Wynelle Link said no) OT Goal Formulation: All assessment and education complete, DC therapy  OT Frequency:     Barriers to D/C:            Co-evaluation              End of Session CPM Left Knee CPM Left Knee: Off Additional Comments: 4 hrs per day. increase by 10 degrees per day  Activity Tolerance: Patient limited by pain Patient left: in chair;with call bell/phone within reach  OT Visit Diagnosis: Pain Pain - Right/Left: Left Pain - part of body: Knee                ADL either performed or assessed with clinical judgement  Time: 0801-0829 OT Time Calculation (min): 28 min student RN in room during OT eval Charges:  OT General Charges $OT Visit: 1  Procedure OT Evaluation $OT Eval Low Complexity: 1 Procedure G-Codes:     Lesle Chris, OTR/L [id number] 12/09/2016  Nani Ingram 12/09/2016, 9:38 AM

## 2016-12-10 LAB — BASIC METABOLIC PANEL
Anion gap: 4 — ABNORMAL LOW (ref 5–15)
BUN: 11 mg/dL (ref 6–20)
CO2: 27 mmol/L (ref 22–32)
Calcium: 8.4 mg/dL — ABNORMAL LOW (ref 8.9–10.3)
Chloride: 110 mmol/L (ref 101–111)
Creatinine, Ser: 0.79 mg/dL (ref 0.61–1.24)
GFR calc Af Amer: >60 mL/min (ref >60)
GFR calc non Af Amer: >60 mL/min (ref >60)
Glucose, Bld: 105 mg/dL — ABNORMAL HIGH (ref 65–99)
Potassium: 4 mmol/L (ref 3.5–5.1)
Sodium: 141 mmol/L (ref 135–145)

## 2016-12-10 LAB — CBC
HCT: 37.6 % — ABNORMAL LOW (ref 39.0–52.0)
Hemoglobin: 12.7 g/dL — ABNORMAL LOW (ref 13.0–17.0)
MCH: 30 pg (ref 26.0–34.0)
MCHC: 33.8 g/dL (ref 30.0–36.0)
MCV: 88.9 fL (ref 78.0–100.0)
Platelets: 346 10*3/uL (ref 150–400)
RBC: 4.23 MIL/uL (ref 4.22–5.81)
RDW: 13.8 % (ref 11.5–15.5)
WBC: 19.2 10*3/uL — ABNORMAL HIGH (ref 4.0–10.5)

## 2016-12-10 MED FILL — oxyCODONE HCL 10 MG TABS: 10 | 7 days supply | Qty: 84 | Fill #0

## 2016-12-10 MED FILL — XARELTO 10 MG TABLET: 10 | 20 days supply | Qty: 20 | Fill #0

## 2016-12-10 MED FILL — traMADol HCL 50 MG TABS: 50 | 7 days supply | Qty: 56 | Fill #0

## 2016-12-10 MED FILL — METHOCARBAMOL 500 MG TABLET: 500 | 20 days supply | Qty: 80 | Fill #0

## 2016-12-10 MED FILL — GABAPENTIN 300 MG CAPSULE: 300 | 21 days supply | Qty: 42 | Fill #0

## 2016-12-10 NOTE — Progress Notes (Signed)
Physical Therapy Treatment Patient Details Name: Daniel PELLOW, MD MRN: [id number] DOB: Mar 07, 1961 Today's Date: 12/10/2016    History of Present Illness L TKA    PT Comments    Pt has met PT goals and is ready to DC home from PT standpoint.   Follow Up Recommendations  Outpatient PT     Equipment Recommendations  Rolling walker with 5" wheels;3in1 (PT)    Recommendations for Other Services       Precautions / Restrictions Precautions Precautions: Knee Precaution Comments: reviewed no pillow under knee Required Braces or Orthoses: Knee Immobilizer - Left Knee Immobilizer - Left: Discontinue once straight leg raise with < 10 degree lag Restrictions Other Position/Activity Restrictions: wbat    Mobility  Bed Mobility Overal bed mobility: Modified Independent Bed Mobility: Supine to Sit     Supine to sit: Modified independent (Device/Increase time)     General bed mobility comments: used leg lifter  Transfers Overall transfer level: Modified independent Equipment used: Rolling walker (2 wheeled) Transfers: Sit to/from Stand Sit to Stand: Modified independent (Device/Increase time)         General transfer comment:  good placement of hands/LLE  Ambulation/Gait Ambulation/Gait assistance: Modified independent (Device/Increase time) Ambulation Distance (Feet): 300 Feet Assistive device: Rolling walker (2 wheeled) Gait Pattern/deviations: Step-to pattern;Antalgic;Decreased weight shift to left Gait velocity: decr Gait velocity interpretation: Below normal speed for age/gender General Gait Details: steady, no LOB, VCs for posture and L heel strike, walked without L KI   Stairs            Wheelchair Mobility    Modified Rankin (Stroke Patients Only)       Balance Overall balance assessment: Modified Independent                                  Cognition Arousal/Alertness: Awake/alert Behavior During Therapy: WFL for tasks  assessed/performed Overall Cognitive Status: Within Functional Limits for tasks assessed                      Exercises Total Joint Exercises Short Arc Quad: AAROM;AROM;Left;10 reps;Supine Straight Leg Raises: AROM;AAROM;Left;10 reps;Supine (with max effort pt is able to do SLR independently) Long Arc Quad: AROM;Left;5 reps;Seated Knee Flexion: AAROM;Left;5 reps;Seated;AROM Goniometric ROM: L knee ext -25* AROM, flexion AROM 90*    General Comments        Pertinent Vitals/Pain Pain Score: 9  Pain Location: L knee with weight bearing Pain Descriptors / Indicators: Sore Pain Intervention(s): Limited activity within patient's tolerance;Monitored during session;Premedicated before session;Ice applied    Home Living                      Prior Function            PT Goals (current goals can now be found in the care plan section) Acute Rehab PT Goals Patient Stated Goal: walk without pain (wants to parachute and ski but Dr Wynelle Link said no) PT Goal Formulation: With patient/family Time For Goal Achievement: 12/12/16 Potential to Achieve Goals: Good Progress towards PT goals: Goals met/education completed, patient discharged from PT    Frequency    7X/week      PT Plan Current plan remains appropriate    Co-evaluation             End of Session Equipment Utilized During Treatment: Gait belt Activity Tolerance: Patient tolerated treatment well Patient  left: in chair;with call bell/phone within reach Nurse Communication: Mobility status PT Visit Diagnosis: Muscle weakness (generalized) (M62.81);Difficulty in walking, not elsewhere classified (R26.2);Pain Pain - Right/Left: Left Pain - part of body: Knee     Time: 0906-1002 PT Time Calculation (min) (ACUTE ONLY): 56 min  Charges:  $Gait Training: 23-37 mins $Therapeutic Exercise: 23-37 mins                    G Codes:       Philomena Doheny 12/10/2016, 10:12 AM [phone]

## 2016-12-11 ENCOUNTER — Other Ambulatory Visit: Payer: Self-pay | Admitting: *Deleted

## 2016-12-11 NOTE — Patient Outreach (Signed)
[facility]) Care Management  12/11/2016  [name], MD Dec 14, 1960 [id number]   Subjective: Telephone call to patient's home / mobile number, no answer, left HIPAA compliant voicemail message, and requested call back.   Objective: Per chart and KPN point of care tool review, patient hospitalized  12/08/16 - 12/10/16 for Osteoarthritis  Left knee.   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   [facility] Care Management preoperative call completed on 12/01/16.     Assessment: Received UMR Transition of care referral on 12/01/16.   Transition of care follow up pending patient contact.   Plan: RNCM will call patient for 2nd telephone outreach attempt, transition of care follow up, within 10 business days if no return call.   Lamoine Magallon H. Annia Friendly, BSN, [place] Management [facility] Telephonic CM Phone: [phone] Fax: [phone]

## 2016-12-12 ENCOUNTER — Other Ambulatory Visit: Payer: Self-pay | Admitting: *Deleted

## 2016-12-12 ENCOUNTER — Ambulatory Visit: Payer: 59 | Attending: Orthopedic Surgery | Admitting: Physical Therapy

## 2016-12-12 ENCOUNTER — Encounter: Payer: Self-pay | Admitting: Physical Therapy

## 2016-12-12 DIAGNOSIS — R600 Localized edema: Secondary | ICD-10-CM | POA: Diagnosis not present

## 2016-12-12 DIAGNOSIS — M25562 Pain in left knee: Secondary | ICD-10-CM | POA: Insufficient documentation

## 2016-12-12 DIAGNOSIS — M25662 Stiffness of left knee, not elsewhere classified: Secondary | ICD-10-CM | POA: Insufficient documentation

## 2016-12-12 DIAGNOSIS — M6281 Muscle weakness (generalized): Secondary | ICD-10-CM | POA: Diagnosis not present

## 2016-12-12 DIAGNOSIS — R2689 Other abnormalities of gait and mobility: Secondary | ICD-10-CM | POA: Diagnosis not present

## 2016-12-12 NOTE — Patient Outreach (Signed)
[facility]) Care Management  12/12/2016  [name], MD 10/09/1961 [id number]   Subjective: Telephone call to patient's home / mobile number, no answer, left HIPAA compliant voicemail message, and requested call back.   Objective: Per chart and KPN point of care tool review, patient hospitalized  12/08/16 - 12/10/16 for [place].   Status post LeftTotal Knee Arthroplasty on 12/08/16.   [facility] Care Management preoperative call completed on 12/01/16.     Assessment: Received UMR Transition of care referral on 12/01/16.   Transition of care follow up pending patient contact.   Plan: RNCM will call patient for 3rd telephone outreach attempt, transition of care follow up, within 10 business days if no return call.   Clotile Whittington H. Annia Friendly, BSN, [place] Management [facility] Telephonic CM Phone: [phone] Fax: [phone]

## 2016-12-12 NOTE — Therapy (Signed)
[place], [place], [address] Phone: [phone]   Fax:  [phone]  Physical Therapy Evaluation  Patient Details  Name: Daniel BAE, [name] MRN: [id number] Date of Birth: [birth year] Referring Provider: [name] [name]  Encounter Date: 12/12/2016      PT End of Session - 12/12/16 1059    Visit Number 1   Number of Visits 20   Date for PT Re-Evaluation 02/06/17   Authorization Type --   PT Start Time 0950  pt arrived 20 minutes late   PT Stop Time 1025   PT Time Calculation (min) 35 min   Activity Tolerance Patient tolerated treatment well   Behavior During Therapy [facility] for tasks assessed/performed      Past Medical History:  Diagnosis Date  . Arthritis   . BPH (benign prostatic hyperplasia)   . Bronchitis   . DDD (degenerative disc disease), cervical   . Diabetes mellitus without complication ([place])    type 2  . DJD (degenerative joint disease)   . Headache    migraines  . Hyperlipidemia   . Pneumonia   . Sinusitis     Past Surgical History:  Procedure Laterality Date  . Hip Resurface  2006  . MENISCUS REPAIR  2008, 1993  . SLAP Tear Repair  2008  . TONSILLECTOMY    . TOTAL KNEE ARTHROPLASTY Left 12/08/2016   Procedure: LEFT TOTAL KNEE ARTHROPLASTY;  Surgeon: [name], [name];  Location: [facility];  Service: Orthopedics;  Laterality: Left;    There were no vitals filed for this visit.       Subjective Assessment - 12/12/16 0957    Subjective pt is a 56 y.o m with CC of L knee pain s/p TKA on 12/08/2016. since the surgery some soreness/ stiffness but overall well. uses RW all the the time. mild numbness in the L foot, pain stays mostly in the knee.   How long can you sit comfortably? 3-4 hours   How long can you stand comfortably? with RW  unusre   How long can you walk comfortably? 10 with RW   Diagnostic tests x-ray   Patient Stated Goals To get past 120 degrees of flexion, fully  extend the knee,   Currently in Pain? Yes   Pain Score 4   took pain medication prior to treatment   Pain Orientation Left   Pain Descriptors / Indicators Aching;Sore;Shooting  weakness   Pain Type Surgical pain   Pain Onset 1 to 4 weeks ago   Pain Frequency Constant   Aggravating Factors  standing walking, bend the knee,    Pain Relieving Factors Icing, medication             [facility] PT Assessment - 12/12/16 0001      Assessment   Medical Diagnosis s/p L TKA   Referring Provider [name] [name]   Onset Date/Surgical Date --  12/08/2016   Hand Dominance Right   Next [name] Visit 12/23/2016   Prior Therapy yes     Precautions   Precautions Other (comment)   Precaution Comments not to over do it     Restrictions   Weight Bearing Restrictions No     Balance Screen   Has the patient fallen in the past 6 months No   Has the patient had a decrease in activity level because of a fear of falling?  No   Is the patient reluctant to leave their home because of a  fear of falling?  No     Home Environment   Living Environment Private residence   Living Arrangements Spouse/significant other   Available Help at Discharge Family;Available PRN/intermittently   Type of Home House   Home Access Stairs to enter   Entrance Stairs-Number of Steps 4   Entrance Stairs-Rails None   Home Layout Two level   Alternate Level Stairs-Number of Steps 30  spiral   Alternate Level Stairs-Rails Can reach both   Home Equipment Walker - 2 wheels     Prior Function   Level of Independence Needs assistance with ADLs;Needs assistance with gait;Requires assistive device for independence     Cognition   Overall Cognitive Status Within Functional Limits for tasks assessed     Observation/Other Assessments   Focus on Therapeutic Outcomes (FOTO)  68% limited  predicted 42% limited     Posture/Postural Control   Posture/Postural Control Postural limitations   Postural Limitations Rounded  Shoulders;Forward head     ROM / Strength   AROM / PROM / Strength AROM;PROM;Strength     AROM   AROM Assessment Site Knee   Right/Left Knee Right;Left   Left Knee Extension -17   Left Knee Flexion 77     PROM   PROM Assessment Site Knee   Right/Left Knee Left   Left Knee Extension -15   Left Knee Flexion 81     Strength   Strength Assessment Site Knee   Right/Left Knee Right;Left     Ambulation/Gait   Ambulation/Gait Yes   Assistive device Rolling walker   Gait Pattern Step-to pattern;Decreased stance time - left;Trendelenburg;Antalgic;Lateral hip instability                   [facility] Adult PT Treatment/Exercise - 12/12/16 0001      Modalities   Modalities Vasopneumatic     Vasopneumatic   Number Minutes Vasopneumatic  10 minutes   Vasopnuematic Location  Knee   Vasopneumatic Pressure Medium   Vasopneumatic Temperature  32                PT Education - 12/12/16 1101    Education provided Yes   Education Details evaluation findings, POC, goals, HEP with proper form and treatment rationale   Person(s) Educated Patient   Methods Explanation;Verbal cues;Handout   Comprehension Verbalized understanding;Verbal cues required          PT Short Term Goals - 12/12/16 1203      PT SHORT TERM GOAL #1   Title pt will be I with initial HEP given (01/12/2017)   Time 4   Period Weeks     PT SHORT TERM GOAL #2   Title pt will be able to verbalize and demo techniques to prevent/ reduce edema/ pain via RICE and HEP(01/12/2017)   Time 4   Period Weeks   Status New     PT SHORT TERM GOAL #3   Title pt will increase L knee flexion to >/= 100 degrees and extension to >/= -5 degrees for functional progression ( 01/12/2017)   Time 4   Period Weeks   Status New     PT SHORT TERM GOAL #4   Title pt will be able to stand/ ambulate >/= 15 min with </= 5/10 pain with LRAD to promote functional mobililty (01/12/2017   Time 4   Period Weeks   Status New            PT Long Term Goals - 12/12/16 1205  PT LONG TERM GOAL #1   Title pt will be I with all HEP given as of last visit (02/06/2017)   Time 8   Period Weeks   Status New     PT LONG TERM GOAL #2   Title pt will increase L knee flexion to >/=125 degrees and extension to >/=-2 degrees with </= 2/10 pain for functional and efficient gait pattern (02/06/2017)   Time 8   Period Weeks   Status New     PT LONG TERM GOAL #3   Title pt will increase L knee/ hip strength to >/= 4/5 for prolonged walking/ standing activities to promote safety (02/06/2017)   Time 8   Period Weeks   Status New     PT LONG TERM GOAL #4   Title pt will be able to walk/ stand for >/= 30 minutes and navigate >/= 15 steps with >/=1 HHA for functional endurance and work related activities (02/06/2017)   Time 8   Period Weeks   Status New     PT LONG TERM GOAL #5   Title He will increase FOTO score by >/= 42% limited to demonstrate improvement in functionat Discharge (02/06/2017)   Time 8   Period Weeks   Status New               Plan - 12/12/16 1102    Clinical Impression Statement 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 he exhibits signifcant swelling in the L knee exhibit limited knee AROM/PROM due to tightness and pain. utilized Vaso to calm down pain and swelling. limited evaluation due to pt arriving late and taking time to get to exam room. He would benefit from physical therapy to decrease edema, and pain, increase mobility and strength and return him to PLOF by addressing the deficists listed.    Rehab Potential Good   PT Frequency 3x / week   PT Duration 8 weeks   PT Treatment/Interventions ADLs/Self Care Home Management;Cryotherapy;Electrical Stimulation;Iontophoresis 4mg /ml Dexamethasone;Moist Heat;Ultrasound;Dry needling;Taping;Vasopneumatic Device;Passive range of motion;Therapeutic  exercise;Therapeutic activities;Gait training;Stair training;Patient/family education;Manual techniques   PT Next Visit Plan assess/ review HEP, mobs, quad activation, [place]? bike vs Nu-step, VASO   PT Home Exercise Plan hamstring stretching, sidelying hip abduction, heel slide with strap,   Consulted and Agree with Plan of Care Patient      Patient will benefit from skilled therapeutic intervention in order to improve the following deficits and impairments:  Abnormal gait, Pain, Difficulty walking, Hypomobility, Decreased strength, Improper body mechanics, Increased edema, Decreased endurance, Decreased activity tolerance, Decreased balance, Decreased range of motion, Increased fascial restricitons, Impaired flexibility, Postural dysfunction  Visit Diagnosis: Acute pain of left knee - Plan: PT plan of care cert/re-cert  Localized edema - Plan: PT plan of care cert/re-cert  Muscle weakness (generalized) - Plan: PT plan of care cert/re-cert  Stiffness of left knee, not elsewhere classified - Plan: PT plan of care cert/re-cert  Other abnormalities of gait and mobility - Plan: PT plan of care cert/re-cert     Problem List Patient Active Problem List   Diagnosis Date Noted  . OA (osteoarthritis) of knee 12/08/2016  . 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 Cervical facet joint syndrome 12/17/2015  . Chronic bilateral low back pain without sciatica 12/17/2015  . Primary osteoarthritis of left knee 12/17/2015    Cosandra Plouffe PT, DPT, LAT,  ATC  12/12/16  12:18 PM      [facility] [address] [place], [place], [address] Phone: [phone]   Fax:  [phone]  Name: Daniel WINBERRY, [name] MRN: [id number] Date of Birth: 06-29-1961

## 2016-12-15 ENCOUNTER — Ambulatory Visit: Payer: 59 | Admitting: Physical Therapy

## 2016-12-15 DIAGNOSIS — M25662 Stiffness of left knee, not elsewhere classified: Secondary | ICD-10-CM | POA: Diagnosis not present

## 2016-12-15 DIAGNOSIS — M25562 Pain in left knee: Secondary | ICD-10-CM | POA: Diagnosis not present

## 2016-12-15 DIAGNOSIS — M6281 Muscle weakness (generalized): Secondary | ICD-10-CM | POA: Diagnosis not present

## 2016-12-15 DIAGNOSIS — R2689 Other abnormalities of gait and mobility: Secondary | ICD-10-CM | POA: Diagnosis not present

## 2016-12-15 DIAGNOSIS — R600 Localized edema: Secondary | ICD-10-CM | POA: Diagnosis not present

## 2016-12-15 NOTE — Therapy (Signed)
[place], [place], [address] Phone: [phone]   Fax:  [phone]  Physical Therapy Treatment  Patient Details  Name: Daniel UDELHOFEN, [name] MRN: [id number] Date of Birth: 20-Jun-1961 Referring Provider: [name] [name]  Encounter Date: 12/15/2016      PT End of Session - 12/15/16 1447    Visit Number 2   Number of Visits 20   Date for PT Re-Evaluation 02/06/17   PT Start Time 1418   PT Stop Time 1518   PT Time Calculation (min) 60 min   Activity Tolerance Patient tolerated treatment well   Behavior During Therapy [facility] for tasks assessed/performed      Past Medical History:  Diagnosis Date  . Arthritis   . BPH (benign prostatic hyperplasia)   . Bronchitis   . DDD (degenerative disc disease), cervical   . Diabetes mellitus without complication ([place])    type 2  . DJD (degenerative joint disease)   . Headache    migraines  . Hyperlipidemia   . Pneumonia   . Sinusitis     Past Surgical History:  Procedure Laterality Date  . Hip Resurface  2006  . MENISCUS REPAIR  2008, 1993  . SLAP Tear Repair  2008  . TONSILLECTOMY    . TOTAL KNEE ARTHROPLASTY Left 12/08/2016   Procedure: LEFT TOTAL KNEE ARTHROPLASTY;  Surgeon: [name], [name];  Location: [facility];  Service: Orthopedics;  Laterality: Left;    There were no vitals filed for this visit.      Subjective Assessment - 12/15/16 1422    Subjective Patient presents with L knee pain, swelling.  He wears the knee immobilizer when he is not exercising it and icing it.  When out of the house he wears it. Did not get HHPT.  Does HEP 5-6 times per day    Currently in Pain? Yes   Pain Score 3   prior to meds is 7/10.  Now 3/10    Pain Location Knee   Pain Orientation Left   Pain Descriptors / Indicators Aching   Pain Type Surgical pain   Pain Onset 1 to 4 weeks ago   Pain Frequency Constant   Aggravating Factors  bending the knee , weightbearing    Pain Relieving Factors ice , meds   Multiple Pain Sites No             [facility] Adult PT Treatment/Exercise - 12/15/16 1435      Knee/Hip Exercises: Stretches   Active Hamstring Stretch Left;3 reps;30 seconds   Active Hamstring Stretch Limitations seated   Knee: Self-Stretch to increase Flexion Left;5 reps;30 seconds     Knee/Hip Exercises: Aerobic   Nustep L3 used UE for A with flexion on L knee, 5 min      Knee/Hip Exercises: Supine   Quad Sets Strengthening;Left;20 reps     Knee/Hip Exercises: Sidelying   Hip ABduction Strengthening;Left;1 set     Vasopneumatic   Number Minutes Vasopneumatic  15 minutes   Vasopnuematic Location  Knee   Vasopneumatic Pressure Medium   Vasopneumatic Temperature  32     Manual Therapy   Joint Mobilization Flexion Gr.1 mobs in supine                 PT Education - 12/15/16 1446    Education provided Yes   Education Details ROM, edema, HEP suggestions and technique    Person(s) Educated Patient   Methods Explanation   Comprehension  Verbalized understanding          PT Short Term Goals - 12/12/16 1203      PT SHORT TERM GOAL #1   Title pt will be I with initial HEP given (01/12/2017)   Time 4   Period Weeks     PT SHORT TERM GOAL #2   Title pt will be able to verbalize and demo techniques to prevent/ reduce edema/ pain via RICE and HEP(01/12/2017)   Time 4   Period Weeks   Status New     PT SHORT TERM GOAL #3   Title pt will increase L knee flexion to >/= 100 degrees and extension to >/= -5 degrees for functional progression ( 01/12/2017)   Time 4   Period Weeks   Status New     PT SHORT TERM GOAL #4   Title pt will be able to stand/ ambulate >/= 15 min with </= 5/10 pain with LRAD to promote functional mobililty (01/12/2017   Time 4   Period Weeks   Status New           PT Long Term Goals - 12/12/16 1205      PT LONG TERM GOAL #1   Title pt will be I with all HEP given as of last visit (02/06/2017)   Time 8    Period Weeks   Status New     PT LONG TERM GOAL #2   Title pt will increase L knee flexion to >/=125 degrees and extension to >/=-2 degrees with </= 2/10 pain for functional and efficient gait pattern (02/06/2017)   Time 8   Period Weeks   Status New     PT LONG TERM GOAL #3   Title pt will increase L knee/ hip strength to >/= 4/5 for prolonged walking/ standing activities to promote safety (02/06/2017)   Time 8   Period Weeks   Status New     PT LONG TERM GOAL #4   Title pt will be able to walk/ stand for >/= 30 minutes and navigate >/= 15 steps with >/=1 HHA for functional endurance and work related activities (02/06/2017)   Time 8   Period Weeks   Status New     PT LONG TERM GOAL #5   Title He will increase FOTO score by >/= 42% limited to demonstrate improvement in functionat Discharge (02/06/2017)   Time 8   Period Weeks   Status New               Plan - 12/15/16 1505    Clinical Impression Statement Patient has increased pain with level 1 knee exercises, appropriate for time frame.  He does get pain relief with positioning and Vasopneumatic.  AAROM L flexion 84 deg.     PT Next Visit Plan assess/ review HEP, mobs, quad activation, [place]? bike vs Nu-step, VASO   PT Home Exercise Plan hamstring stretching, sidelying hip abduction, heel slide with strap,   Consulted and Agree with Plan of Care Patient      Patient will benefit from skilled therapeutic intervention in order to improve the following deficits and impairments:  Abnormal gait, Pain, Difficulty walking, Hypomobility, Decreased strength, Improper body mechanics, Increased edema, Decreased endurance, Decreased activity tolerance, Decreased balance, Decreased range of motion, Increased fascial restricitons, Impaired flexibility, Postural dysfunction  Visit Diagnosis: Acute pain of left knee  Localized edema  Muscle weakness (generalized)  Stiffness of left knee, not elsewhere classified  Other  abnormalities of gait and mobility  Problem List Patient Active Problem List   Diagnosis Date Noted  . OA (osteoarthritis) of knee 12/08/2016  . 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 Cervical facet joint syndrome 12/17/2015  . Chronic bilateral low back pain without sciatica 12/17/2015  . Primary osteoarthritis of left knee 12/17/2015    PAA,JENNIFER 12/15/2016, 3:10 PM  [facility] [address] [place], [place], [address] Phone: [phone]   Fax:  [phone]  Name: Daniel SHUCK, [name] MRN: [id number] Date of Birth: 1961/01/09  Raeford Razor, PT 12/15/16 3:11 PM Phone: [phone] Fax: [phone]

## 2016-12-16 ENCOUNTER — Other Ambulatory Visit: Payer: Self-pay | Admitting: *Deleted

## 2016-12-16 ENCOUNTER — Encounter: Payer: Self-pay | Admitting: *Deleted

## 2016-12-16 NOTE — Patient Outreach (Addendum)
[name]) Care Management  12/16/2016  Daniel Bossier, MD Jan 17, 1961 [id number]   Subjective:Telephone call to patient's home / mobile number, spoke with patient, and HIPAA verified.   Discussed [facility] Care Management UMR Transition of care follow up, patient voiced understanding, and is in agreement to complete follow up at a later time.   States he is currently sleeping and requested call back.    Objective: Per chart and KPN point of care tool review, patient hospitalized 12/08/16 - 12/10/16 for [place]. Status post LeftTotal Knee Arthroplasty on 12/08/16. [facility] Care Management preoperative call completed on 12/01/16.    Assessment: Received UMR Transition of care referral on 12/01/16. Transition of care follow up pending patient contact.   Plan: RNCM will send patient unsuccessful outreach letter,THN pamphlet, and proceed with case closure within 10 business days,  if no return call.   Sybel Standish H. Annia Friendly, BSN, [place] Management [facility] Telephonic CM Phone: [phone] Fax: [phone]

## 2016-12-17 ENCOUNTER — Other Ambulatory Visit: Payer: Self-pay | Admitting: *Deleted

## 2016-12-17 NOTE — Patient Outreach (Addendum)
[facility]) Care Management  12/17/2016  [name], MD 1961-01-17 [id number]   Subjective:Telephone call to patient's home / mobile number, no answer, and unable to leave message due to mailbox being full.   Objective: Per chart and KPN point of care tool review, patient hospitalized 12/08/16 - 12/10/16 for [place]. Status post LeftTotal Knee Arthroplasty on 12/08/16. [facility] Care Management preoperative call completed on 12/01/16.    Assessment: Received UMR Transition of care referral on 12/01/16. Transition of care follow up pending patient contact.   Plan: RNCM will proceed with case closure within 10 business days,  if no return call.   Trenesha Alcaide H. Annia Friendly, BSN, [place] Management [facility] Telephonic CM Phone: [phone] Fax: [phone]

## 2016-12-18 ENCOUNTER — Ambulatory Visit: Payer: 59

## 2016-12-18 DIAGNOSIS — M25662 Stiffness of left knee, not elsewhere classified: Secondary | ICD-10-CM | POA: Diagnosis not present

## 2016-12-18 DIAGNOSIS — M6281 Muscle weakness (generalized): Secondary | ICD-10-CM

## 2016-12-18 DIAGNOSIS — R2689 Other abnormalities of gait and mobility: Secondary | ICD-10-CM

## 2016-12-18 DIAGNOSIS — R600 Localized edema: Secondary | ICD-10-CM

## 2016-12-18 DIAGNOSIS — M25562 Pain in left knee: Secondary | ICD-10-CM

## 2016-12-18 NOTE — Therapy (Signed)
[place], [place], [address] Phone: [phone]   Fax:  [phone]  Physical Therapy Treatment  Patient Details  Name: [name], [name] MRN: [id number] Date of Birth: 10-18-1960 Referring Provider: [name] [name]  Encounter Date: 12/18/2016      PT End of Session - 12/18/16 0711    Visit Number 3   Number of Visits 20   Date for PT Re-Evaluation 02/06/17   PT Start Time 0702   PT Stop Time 0805   PT Time Calculation (min) 63 min   Activity Tolerance Patient tolerated treatment well;Patient limited by pain   Behavior During Therapy [facility] for tasks assessed/performed      Past Medical History:  Diagnosis Date  . Arthritis   . BPH (benign prostatic hyperplasia)   . Bronchitis   . DDD (degenerative disc disease), cervical   . Diabetes mellitus without complication ([place])    type 2  . DJD (degenerative joint disease)   . Headache    migraines  . Hyperlipidemia   . Pneumonia   . Sinusitis     Past Surgical History:  Procedure Laterality Date  . Hip Resurface  2006  . MENISCUS REPAIR  2008, 1993  . SLAP Tear Repair  2008  . TONSILLECTOMY    . TOTAL KNEE ARTHROPLASTY Left 12/08/2016   Procedure: LEFT TOTAL KNEE ARTHROPLASTY;  Surgeon: [name], [name];  Location: [facility];  Service: Orthopedics;  Laterality: Left;    There were no vitals filed for this visit.      Subjective Assessment - 12/18/16 0708    Subjective Don't htink its going wel but PA at surgeons office says he is doing well 10 days post op . Pain varies with 9/10 waking pain and 4/10 at rest in lobby. [name] 3/13/1   Pain Score 4    Pain Location Knee   Pain Orientation Left   Pain Descriptors / Indicators Aching   Pain Type Surgical pain   Pain Onset 1 to 4 weeks ago   Pain Frequency Constant   Aggravating Factors  bending and weight bearing   Pain Relieving Factors ice, meds            [facility] PT Assessment -  12/18/16 0001      AROM   Left Knee Extension -15   Left Knee Flexion 77     PROM   Left Knee Extension -10                     [facility] Adult PT Treatment/Exercise - 12/18/16 0001      Knee/Hip Exercises: Stretches   Other Knee/Hip Stretches seated TKE /QS x 15 5sec for extension stretche     Knee/Hip Exercises: Aerobic   Nustep L3 used UE for A with flexion on L knee, 5 min      Knee/Hip Exercises: Standing   Heel Raises Both   Heel Raises Limitations 12 reps with bilateral reach overhead   Other Standing Knee Exercises wall reaches x 8 facing wall RT/LT with cues  then TKE into ball x 12      Knee/Hip Exercises: Supine   Short Arc Quad Sets Both;15 reps   Short Arc Quad Sets Limitations 5 sec hold     Vasopneumatic   Number Minutes Vasopneumatic  15 minutes   Vasopnuematic Location  Knee   Vasopneumatic Pressure Medium   Vasopneumatic Temperature  32     Manual  Therapy   Manual Therapy Soft tissue mobilization   Soft tissue mobilization calf/hamstrings and quads with retro massage followed by passive flexion stretch  with gravity and support under lower leg. 6x20 sec. Quite poainful but he tolerated this.  Tool used for quads.                   PT Short Term Goals - 12/18/16 0753      PT SHORT TERM GOAL #1   Title pt will be I with initial HEP given (01/12/2017)   Status On-going     PT SHORT TERM GOAL #2   Title pt will be able to verbalize and demo techniques to prevent/ reduce edema/ pain via RICE and HEP(01/12/2017)   Status On-going     PT SHORT TERM GOAL #3   Title pt will increase L knee flexion to >/= 100 degrees and extension to >/= -5 degrees for functional progression ( 01/12/2017)   Status On-going           PT Long Term Goals - 12/12/16 1205      PT LONG TERM GOAL #1   Title pt will be I with all HEP given as of last visit (02/06/2017)   Time 8   Period Weeks   Status New     PT LONG TERM GOAL #2   Title pt will increase  L knee flexion to >/=125 degrees and extension to >/=-2 degrees with </= 2/10 pain for functional and efficient gait pattern (02/06/2017)   Time 8   Period Weeks   Status New     PT LONG TERM GOAL #3   Title pt will increase L knee/ hip strength to >/= 4/5 for prolonged walking/ standing activities to promote safety (02/06/2017)   Time 8   Period Weeks   Status New     PT LONG TERM GOAL #4   Title pt will be able to walk/ stand for >/= 30 minutes and navigate >/= 15 steps with >/=1 HHA for functional endurance and work related activities (02/06/2017)   Time 8   Period Weeks   Status New     PT LONG TERM GOAL #5   Title He will increase FOTO score by >/= 42% limited to demonstrate improvement in functionat Discharge (02/06/2017)   Time [place]   Status New               Plan - 12/18/16 4401    Clinical Impression Statement Dr Sherral Hammers appears to be doing well limited by pain and swelling. He did well with exercises. Still only 10 days out from surgery. Asked pt to let us known if incr pain and edema over next 2 days and will decrease intensity of treatment.    PT Treatment/Interventions ADLs/Self Care Home Management;Cryotherapy;Electrical Stimulation;Iontophoresis 4mg /ml Dexamethasone;Moist Heat;Ultrasound;Dry needling;Taping;Vasopneumatic Device;Passive range of motion;Therapeutic exercise;Therapeutic activities;Gait training;Stair training;Patient/family education;Manual techniques   PT Next Visit Plan assess/ review HEP, mobs, quad activation, [place]? bike vs Nu-step, VASO   PT Home Exercise Plan hamstring stretching, sidelying hip abduction, heel slide with strap,   Consulted and Agree with Plan of Care Patient      Patient will benefit from skilled therapeutic intervention in order to improve the following deficits and impairments:  Abnormal gait, Pain, Difficulty walking, Hypomobility, Decreased strength, Improper body mechanics, Increased edema, Decreased endurance,  Decreased activity tolerance, Decreased balance, Decreased range of motion, Increased fascial restricitons, Impaired flexibility, Postural dysfunction  Visit Diagnosis: Acute pain  of left knee  Localized edema  Stiffness of left knee, not elsewhere classified  Muscle weakness (generalized)  Other abnormalities of gait and mobility     Problem List Patient Active Problem List   Diagnosis Date Noted  . OA (osteoarthritis) of knee 12/08/2016  . 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 Cervical facet joint syndrome 12/17/2015  . Chronic bilateral low back pain without sciatica 12/17/2015  . Primary osteoarthritis of left knee 12/17/2015    Darrel Hoover PT 12/18/2016, 7:54 AM  [facility] [address] [place], [place], [address] Phone: [phone]   Fax:  [phone]  Name: Daniel RATAY, [name] MRN: [id number] Date of Birth: 08-27-1961

## 2016-12-23 ENCOUNTER — Ambulatory Visit: Payer: 59 | Admitting: Physical Therapy

## 2016-12-23 ENCOUNTER — Encounter: Payer: Self-pay | Admitting: Physical Therapy

## 2016-12-23 DIAGNOSIS — R2689 Other abnormalities of gait and mobility: Secondary | ICD-10-CM | POA: Diagnosis not present

## 2016-12-23 DIAGNOSIS — M25662 Stiffness of left knee, not elsewhere classified: Secondary | ICD-10-CM | POA: Diagnosis not present

## 2016-12-23 DIAGNOSIS — M25562 Pain in left knee: Secondary | ICD-10-CM

## 2016-12-23 DIAGNOSIS — R600 Localized edema: Secondary | ICD-10-CM

## 2016-12-23 DIAGNOSIS — M6281 Muscle weakness (generalized): Secondary | ICD-10-CM | POA: Diagnosis not present

## 2016-12-23 NOTE — Therapy (Signed)
[place], [place], [address] Phone: [phone]   Fax:  [phone]  Physical Therapy Treatment  Patient Details  Name: Daniel BUFFIN, MD MRN: [id number] Date of Birth: Feb 03, 1961 Referring Provider: [name] MD  Encounter Date: 12/23/2016      PT End of Session - 12/23/16 1232    Visit Number 4   Number of Visits 20   Date for PT Re-Evaluation 02/06/17   PT Start Time 1055   PT Stop Time 1145   PT Time Calculation (min) 50 min   Activity Tolerance Patient tolerated treatment well   Behavior During Therapy [facility] for tasks assessed/performed      Past Medical History:  Diagnosis Date  . Arthritis   . BPH (benign prostatic hyperplasia)   . Bronchitis   . DDD (degenerative disc disease), cervical   . Diabetes mellitus without complication ([place])    type 2  . DJD (degenerative joint disease)   . Headache    migraines  . Hyperlipidemia   . Pneumonia   . Sinusitis     Past Surgical History:  Procedure Laterality Date  . Hip Resurface  2006  . MENISCUS REPAIR  2008, 1993  . SLAP Tear Repair  2008  . TONSILLECTOMY    . TOTAL KNEE ARTHROPLASTY Left 12/08/2016   Procedure: LEFT TOTAL KNEE ARTHROPLASTY;  Surgeon: Daniel Arabian, MD;  Location: [facility];  Service: Orthopedics;  Laterality: Left;    There were no vitals filed for this visit.      Subjective Assessment - 12/23/16 1052    Subjective "I stopped using the straight leg brace off"   Currently in Pain? Yes   Pain Score 6   just took pain medications   Pain Orientation Left            [facility] PT Assessment - 12/23/16 0001      AROM   Left Knee Extension -16   Left Knee Flexion 94                     [facility] Adult PT Treatment/Exercise - 12/23/16 0001      Knee/Hip Exercises: Aerobic   Recumbent Bike rocking forward/ back ward x 5 min     Modalities   Modalities Electrical Stimulation     Electrical  Stimulation   Electrical Stimulation Location L VMO/ rectus femoris   Electrical Stimulation Action [place]   Electrical Stimulation Parameters 10/10, 2 sec ramp, intensity 60, x 15 min   Electrical Stimulation Goals Neuromuscular facilitation     Vasopneumatic   Number Minutes Vasopneumatic  10 minutes   Vasopnuematic Location  Knee   Vasopneumatic Pressure Medium   Vasopneumatic Temperature  32     Manual Therapy   Manual Therapy Passive ROM   Manual therapy comments soft tissue massage along the distal hamstring tendons   Joint Mobilization Grade 1-2   Passive ROM --                PT Education - 12/23/16 1231    Education Details if having constant pain following exercise that last a few days then may be over doing it, back off a little to avoid over doing it.    Person(s) Educated Patient   Methods Explanation;Verbal cues;Handout   Comprehension Verbalized understanding;Verbal cues required          PT Short Term Goals - 12/18/16 0753      PT SHORT TERM  GOAL #1   Title pt will be I with initial HEP given (01/12/2017)   Status On-going     PT SHORT TERM GOAL #2   Title pt will be able to verbalize and demo techniques to prevent/ reduce edema/ pain via RICE and HEP(01/12/2017)   Status On-going     PT SHORT TERM GOAL #3   Title pt will increase L knee flexion to >/= 100 degrees and extension to >/= -5 degrees for functional progression ( 01/12/2017)   Status On-going           PT Long Term Goals - 12/12/16 1205      PT LONG TERM GOAL #1   Title pt will be I with all HEP given as of last visit (02/06/2017)   Time 8   Period Weeks   Status New     PT LONG TERM GOAL #2   Title pt will increase L knee flexion to >/=125 degrees and extension to >/=-2 degrees with </= 2/10 pain for functional and efficient gait pattern (02/06/2017)   Time 8   Period Weeks   Status New     PT LONG TERM GOAL #3   Title pt will increase L knee/ hip strength to >/= 4/5 for  prolonged walking/ standing activities to promote safety (02/06/2017)   Time 8   Period Weeks   Status New     PT LONG TERM GOAL #4   Title pt will be able to walk/ stand for >/= 30 minutes and navigate >/= 15 steps with >/=1 HHA for functional endurance and work related activities (02/06/2017)   Time 8   Period Weeks   Status New     PT LONG TERM GOAL #5   Title He will increase FOTO score by >/= 42% limited to demonstrate improvement in functionat Discharge (02/06/2017)   Time 8   Period Weeks   Status New               Plan - 12/23/16 1233    Clinical Impression Statement Dr. Sherral Moore reports fluctuating edema and pain depending on the amount of exercise. Discussed avoiding doing too much. Worked on quad activation via [place] E-stim and mobs to improve mobility which he is improving flexion to 94 and extension was -16. continued vaso for pain and inflammation.    PT Next Visit Plan assess/ review HEP, how was [place]?, mobs, quad activation,bike vs Nu-step, VASO   Consulted and Agree with Plan of Care Patient      Patient will benefit from skilled therapeutic intervention in order to improve the following deficits and impairments:  Abnormal gait, Pain, Difficulty walking, Hypomobility, Decreased strength, Improper body mechanics, Increased edema, Decreased endurance, Decreased activity tolerance, Decreased balance, Decreased range of motion, Increased fascial restricitons, Impaired flexibility, Postural dysfunction  Visit Diagnosis: Acute pain of left knee  Localized edema  Stiffness of left knee, not elsewhere classified  Muscle weakness (generalized)  Other abnormalities of gait and mobility     Problem List Patient Active Problem List   Diagnosis Date Noted  . OA (osteoarthritis) of knee 12/08/2016  . 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 Cervical facet joint syndrome 12/17/2015  . Chronic bilateral low back pain  without sciatica 12/17/2015  . Primary osteoarthritis of left knee 12/17/2015   [name] PT, DPT, LAT, ATC  12/23/16  12:36 PM      [facility] [place],  [place], [address] Phone: [phone]   Fax:  [phone]  Name: Daniel Bossier, MD MRN: [id number] Date of Birth: 1961-07-10

## 2016-12-24 MED FILL — diazePAM 5 MG TABS: 5 | 30 days supply | Qty: 60 | Fill #1

## 2016-12-24 MED FILL — ZOLPIDEM TARTRATE 10 MG TAB: 10 | 90 days supply | Qty: 90 | Fill #1

## 2016-12-24 MED FILL — oxyCODONE HCL 10 MG TABS: 10 | 5 days supply | Qty: 56 | Fill #0

## 2016-12-24 MED FILL — BUTALBITAL/APAP/CAFFEINE TB: 50-325-40 | 10 days supply | Qty: 60 | Fill #1

## 2016-12-25 ENCOUNTER — Encounter: Payer: Self-pay | Admitting: Physical Therapy

## 2016-12-25 ENCOUNTER — Ambulatory Visit: Payer: 59 | Admitting: Physical Therapy

## 2016-12-25 DIAGNOSIS — M25662 Stiffness of left knee, not elsewhere classified: Secondary | ICD-10-CM

## 2016-12-25 DIAGNOSIS — M6281 Muscle weakness (generalized): Secondary | ICD-10-CM

## 2016-12-25 DIAGNOSIS — R600 Localized edema: Secondary | ICD-10-CM

## 2016-12-25 DIAGNOSIS — M25562 Pain in left knee: Secondary | ICD-10-CM

## 2016-12-25 DIAGNOSIS — R2689 Other abnormalities of gait and mobility: Secondary | ICD-10-CM | POA: Diagnosis not present

## 2016-12-25 NOTE — Therapy (Signed)
[place], [place], [address] Phone: [phone]   Fax:  [phone]  Physical Therapy Treatment  Patient Details  Name: Daniel DESHLER, MD MRN: [id number] Date of Birth: 02-Sep-1961 Referring Provider: [name] MD  Encounter Date: 12/25/2016      PT End of Session - 12/25/16 1239    Visit Number 5   Number of Visits 20   Date for PT Re-Evaluation 02/06/17   PT Start Time 1157   PT Stop Time 1246   PT Time Calculation (min) 49 min   Activity Tolerance Patient tolerated treatment well   Behavior During Therapy [facility] for tasks assessed/performed      Past Medical History:  Diagnosis Date  . Arthritis   . BPH (benign prostatic hyperplasia)   . Bronchitis   . DDD (degenerative disc disease), cervical   . Diabetes mellitus without complication ([place])    type 2  . DJD (degenerative joint disease)   . Headache    migraines  . Hyperlipidemia   . Pneumonia   . Sinusitis     Past Surgical History:  Procedure Laterality Date  . Hip Resurface  2006  . MENISCUS REPAIR  2008, 1993  . SLAP Tear Repair  2008  . TONSILLECTOMY    . 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      Subjective Assessment - 12/25/16 1201    Subjective "I did see the MD, he is happy with the motion"    Currently in Pain? Yes   Pain Score 8    Pain Orientation Left   Aggravating Factors  bending, walking/ standing   Pain Relieving Factors ice, meds                         [facility] Adult PT Treatment/Exercise - 12/25/16 0001      Knee/Hip Exercises: Aerobic   Recumbent Bike rocking back/ forth x2 min, full revolutions x 4 min     Vasopneumatic   Number Minutes Vasopneumatic  10 minutes   Vasopnuematic Location  Knee    Vasopneumatic Pressure Medium   Vasopneumatic Temperature  32     Manual Therapy   Manual therapy comments densensitation technique using cloth   Joint Mobilization Grade 1-2   Passive ROM knee flexion with PT forearm in popliteal space                PT Education - 12/25/16 1239    Education provided Yes   Education Details updated HEP    Person(s) Educated Patient   Methods Explanation;Verbal cues   Comprehension Verbalized understanding;Verbal cues required          PT Short Term Goals - 12/18/16 0753      PT SHORT TERM GOAL #1   Title pt will be I with initial HEP given (01/12/2017)   Status On-going     PT SHORT TERM GOAL #2   Title pt will be able to verbalize and demo techniques to prevent/ reduce edema/ pain via RICE and HEP(01/12/2017)   Status On-going     PT SHORT TERM GOAL #3   Title pt will increase L knee flexion to >/= 100 degrees and extension to >/= -5 degrees for functional progression ( 01/12/2017)   Status  On-going           PT Long Term Goals - 12/12/16 1205      PT LONG TERM GOAL #1   Title pt will be I with all HEP given as of last visit (02/06/2017)   Time 8   Period Weeks   Status New     PT LONG TERM GOAL #2   Title pt will increase L knee flexion to >/=125 degrees and extension to >/=-2 degrees with </= 2/10 pain for functional and efficient gait pattern (02/06/2017)   Time 8   Period Weeks   Status New     PT LONG TERM GOAL #3   Title pt will increase L knee/ hip strength to >/= 4/5 for prolonged walking/ standing activities to promote safety (02/06/2017)   Time 8   Period Weeks   Status New     PT LONG TERM GOAL #4   Title pt will be able to walk/ stand for >/= 30 minutes and navigate >/= 15 steps with >/=1 HHA for functional endurance and work related activities (02/06/2017)   Time 8   Period Weeks   Status New     PT LONG TERM GOAL #5   Title He will increase FOTO score by >/= 42% limited to demonstrate improvement in  functionat Discharge (02/06/2017)   Time 8   Period Weeks   Status New               Plan - 12/25/16 1240    Clinical Impression Statement Dr. Sherral Hammers arrived 13 minutes late today. continued working on mobilty to increaesd flexion/ extension. updated HEP with hamstring/ quad stretching. Continued vaso for edema and pain. he reported decreased pain post session.    Rehab Potential Good   PT Next Visit Plan update HEP how was [place]?, mobs, manual , retro-stepping quad activation bike   PT Home Exercise Plan hamstring stretching, sidelying hip abduction, heel slide with strap,   Consulted and Agree with Plan of Care Patient      Patient will benefit from skilled therapeutic intervention in order to improve the following deficits and impairments:  Abnormal gait, Pain, Difficulty walking, Hypomobility, Decreased strength, Improper body mechanics, Increased edema, Decreased endurance, Decreased activity tolerance, Decreased balance, Decreased range of motion, Increased fascial restricitons, Impaired flexibility, Postural dysfunction  Visit Diagnosis: Acute pain of left knee  Localized edema  Stiffness of left knee, not elsewhere classified  Muscle weakness (generalized)  Other abnormalities of gait and mobility     Problem List Patient Active Problem List   Diagnosis Date Noted  . OA (osteoarthritis) of knee 12/08/2016  . 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 Cervical facet joint syndrome 12/17/2015  . Chronic bilateral low back pain without sciatica 12/17/2015  . Primary osteoarthritis of left knee 12/17/2015   Starr Lake PT, DPT, LAT, ATC  12/25/16  12:53 PM      [facility] [address] [place], [place], [address] Phone: [phone]   Fax:  [phone]  Name: NELVIN TOMB, MD MRN: [id number] Date of Birth: 1960-11-05

## 2016-12-26 ENCOUNTER — Encounter: Payer: Self-pay | Admitting: Physical Therapy

## 2016-12-26 ENCOUNTER — Ambulatory Visit: Payer: 59 | Admitting: Physical Therapy

## 2016-12-26 DIAGNOSIS — R600 Localized edema: Secondary | ICD-10-CM

## 2016-12-26 DIAGNOSIS — M6281 Muscle weakness (generalized): Secondary | ICD-10-CM

## 2016-12-26 DIAGNOSIS — M25562 Pain in left knee: Secondary | ICD-10-CM

## 2016-12-26 DIAGNOSIS — R2689 Other abnormalities of gait and mobility: Secondary | ICD-10-CM

## 2016-12-26 DIAGNOSIS — M25662 Stiffness of left knee, not elsewhere classified: Secondary | ICD-10-CM | POA: Diagnosis not present

## 2016-12-26 NOTE — Therapy (Signed)
[place], [place], [address] Phone: [phone]   Fax:  [phone]  Physical Therapy Treatment  Patient Details  Name: Daniel WORTHING, MD MRN: [id number] Date of Birth: 14-May-1961 Referring Provider: [name] MD  Encounter Date: 12/26/2016      PT End of Session - 12/26/16 1016    Visit Number 6   Number of Visits 20   Date for PT Re-Evaluation 02/06/17   PT Start Time 0932   PT Stop Time 1025   PT Time Calculation (min) 53 min   Activity Tolerance Patient tolerated treatment well   Behavior During Therapy [facility] for tasks assessed/performed      Past Medical History:  Diagnosis Date  . Arthritis   . BPH (benign prostatic hyperplasia)   . Bronchitis   . DDD (degenerative disc disease), cervical   . Diabetes mellitus without complication ([place])    type 2  . DJD (degenerative joint disease)   . Headache    migraines  . Hyperlipidemia   . Pneumonia   . Sinusitis     Past Surgical History:  Procedure Laterality Date  . Hip Resurface  2006  . MENISCUS REPAIR  2008, 1993  . SLAP Tear Repair  2008  . TONSILLECTOMY    . 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      Subjective Assessment - 12/26/16 0936    Subjective " I am not sure what happened but I am pretty sore today"   Currently in Pain? Yes   Pain Score 8    Pain Location Knee   Pain Orientation Left            [facility] PT Assessment - 12/26/16 0001      AROM   Left Knee Flexion 99                     [facility] Adult PT Treatment/Exercise - 12/26/16 0001      Knee/Hip Exercises: Stretches   Active Hamstring Stretch 2 reps;30 seconds   Quad Stretch 2 reps;30 seconds     Knee/Hip Exercises: Aerobic   Nustep L4 x 6 min UE/LE  using  arms to push ROM     Knee/Hip Exercises: Supine   Straight Leg Raises AROM;Strengthening;2 sets;10 reps  reseting each rep with quad set   Other Supine Knee/Hip Exercises clamshells 2 x 15 with red [place]     Knee/Hip Exercises: Sidelying   Hip ABduction 2 sets;10 reps     Electrical Stimulation   Electrical Stimulation Location L VMO/ rectus femoris  with Garment/textile technologist Stimulation Parameters 10/10, 2 sec ramp, Level 50 x 10 min, PPS 100   Electrical Stimulation Goals Neuromuscular facilitation     Vasopneumatic   Number Minutes Vasopneumatic  10 minutes   Vasopnuematic Location  Knee   Vasopneumatic Pressure Medium   Vasopneumatic Temperature  32     Manual Therapy   Joint Mobilization Grade 1-2 PA- AP   Passive ROM knee flexion with PT forearm in popliteal space                PT Education - 12/26/16 1016    Education provided Yes   Education Details updated HEP for sidelying abduction.  Person(s) Educated Patient   Methods Explanation;Verbal cues;Handout   Comprehension Verbalized understanding;Verbal cues required          PT Short Term Goals - 12/18/16 0753      PT SHORT TERM GOAL #1   Title pt will be I with initial HEP given (01/12/2017)   Status On-going     PT SHORT TERM GOAL #2   Title pt will be able to verbalize and demo techniques to prevent/ reduce edema/ pain via RICE and HEP(01/12/2017)   Status On-going     PT SHORT TERM GOAL #3   Title pt will increase L knee flexion to >/= 100 degrees and extension to >/= -5 degrees for functional progression ( 01/12/2017)   Status On-going           PT Long Term Goals - 12/12/16 1205      PT LONG TERM GOAL #1   Title pt will be I with all HEP given as of last visit (02/06/2017)   Time 8   Period Weeks   Status New     PT LONG TERM GOAL #2   Title pt will increase L knee flexion to >/=125 degrees and extension to >/=-2 degrees with </= 2/10 pain for  functional and efficient gait pattern (02/06/2017)   Time 8   Period Weeks   Status New     PT LONG TERM GOAL #3   Title pt will increase L knee/ hip strength to >/= 4/5 for prolonged walking/ standing activities to promote safety (02/06/2017)   Time 8   Period Weeks   Status New     PT LONG TERM GOAL #4   Title pt will be able to walk/ stand for >/= 30 minutes and navigate >/= 15 steps with >/=1 HHA for functional endurance and work related activities (02/06/2017)   Time 8   Period Weeks   Status New     PT LONG TERM GOAL #5   Title He will increase FOTO score by >/= 42% limited to demonstrate improvement in functionat Discharge (02/06/2017)   Time 8   Period Weeks   Status New               Plan - 12/26/16 1018    Clinical Impression Statement pt continues to improve with AROM increasing flexion to 99 degrees. conitnued mobs to promote knee mobility and hip/ quad strengthening which he performed well reporting soreness following exericses. continued vaso for pain and edema.   PT Next Visit Plan update HEP how was [place]?, mobs, manual , retro-stepping quad activation, bike, cross friciton massage, education antegrade/ retrograde massage for swelling   PT Home Exercise Plan hamstring stretching, sidelying hip abduction with knee bent/ knee straight, heel slide with strap,   Consulted and Agree with Plan of Care Patient      Patient will benefit from skilled therapeutic intervention in order to improve the following deficits and impairments:  Abnormal gait, Pain, Difficulty walking, Hypomobility, Decreased strength, Improper body mechanics, Increased edema, Decreased endurance, Decreased activity tolerance, Decreased balance, Decreased range of motion, Increased fascial restricitons, Impaired flexibility, Postural dysfunction  Visit Diagnosis: Acute pain of left knee  Localized edema  Stiffness of left knee, not elsewhere classified  Muscle weakness  (generalized)  Other abnormalities of gait and mobility     Problem List Patient Active Problem List   Diagnosis Date Noted  . OA (osteoarthritis) of knee 12/08/2016  . 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 without status migrainosus  12/17/2015  . Cervical facet joint syndrome 12/17/2015  . Chronic bilateral low back pain without sciatica 12/17/2015  . Primary osteoarthritis of left knee 12/17/2015   Starr Lake PT, DPT, LAT, ATC  12/26/16  10:23 AM      [place] [facility] [address] [place], [place], [address] Phone: [phone]   Fax:  [phone]  Name: CARRIE SCHOONMAKER, MD MRN: [id number] Date of Birth: 1961-05-01

## 2016-12-30 ENCOUNTER — Other Ambulatory Visit: Payer: Self-pay | Admitting: *Deleted

## 2016-12-30 ENCOUNTER — Encounter: Payer: Self-pay | Admitting: Physical Therapy

## 2016-12-30 ENCOUNTER — Ambulatory Visit: Payer: 59 | Admitting: Physical Therapy

## 2016-12-30 DIAGNOSIS — M25562 Pain in left knee: Secondary | ICD-10-CM | POA: Diagnosis not present

## 2016-12-30 DIAGNOSIS — R2689 Other abnormalities of gait and mobility: Secondary | ICD-10-CM

## 2016-12-30 DIAGNOSIS — M25662 Stiffness of left knee, not elsewhere classified: Secondary | ICD-10-CM | POA: Diagnosis not present

## 2016-12-30 DIAGNOSIS — M6281 Muscle weakness (generalized): Secondary | ICD-10-CM | POA: Diagnosis not present

## 2016-12-30 DIAGNOSIS — R600 Localized edema: Secondary | ICD-10-CM

## 2016-12-30 NOTE — Patient Outreach (Signed)
[facility]) Care Management  12/30/2016  [name], MD 11/12/1960 [id number]   No response from patient outreach attempts will proceed with case closure.   Objective: Per chart and K[facility] point of care tool review, patient hospitalized 12/08/16 - 12/10/16 for [place]. Status post LeftTotal Knee Arthroplasty on 12/08/16. [facility] Care Management preoperative call completed on 12/01/16.    Assessment: Received [facility] Transition of care referral on 12/01/16. Transition of care follow up not completed due to patient unable to reach, will proceed with case closure.   Plan: RNCM will case closure due to unable to reach request to [facility] [place] Management.       Kahiau Schewe H. Annia Friendly, BSN, [place] Management [facility] Telephonic CM Phone: [phone] Fax: [phone]

## 2016-12-30 NOTE — Therapy (Signed)
[place], [place], [address] Phone: [phone]   Fax:  [phone]  Physical Therapy Treatment  Patient Details  Name: Daniel DRUDGE, MD MRN: [id number] Date of Birth: 29-Jan-1961 Referring Provider: [name] MD  Encounter Date: 12/30/2016      PT End of Session - 12/30/16 1216    Visit Number 7   Number of Visits 20   Date for PT Re-Evaluation 02/06/17   PT Start Time 1102   PT Stop Time 1150   PT Time Calculation (min) 48 min   Activity Tolerance Patient tolerated treatment well   Behavior During Therapy [facility] for tasks assessed/performed      Past Medical History:  Diagnosis Date  . Arthritis   . BPH (benign prostatic hyperplasia)   . Bronchitis   . DDD (degenerative disc disease), cervical   . Diabetes mellitus without complication ([place])    type 2  . DJD (degenerative joint disease)   . Headache    migraines  . Hyperlipidemia   . Pneumonia   . Sinusitis     Past Surgical History:  Procedure Laterality Date  . Hip Resurface  2006  . MENISCUS REPAIR  2008, 1993  . SLAP Tear Repair  2008  . TONSILLECTOMY    . 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      Subjective Assessment - 12/30/16 1106    Subjective " I am been more sore with in the knee, I got a SPC which since I used it it made things worse"    Currently in Pain? Yes   Pain Score 8                          [facility] Adult PT Treatment/Exercise - 12/30/16 0001      Knee/Hip Exercises: Aerobic   Recumbent Bike rocking back/ forth x2 min, full revolutions x 4 min     Electrical Stimulation   Electrical Stimulation Location L knee   Electrical Stimulation Action IFC   Electrical Stimulation Parameters 100% scan, L 20 x 10 min    Electrical Stimulation Goals Pain     Vasopneumatic   Number Minutes Vasopneumatic  10 minutes   Vasopnuematic Location  Knee   Vasopneumatic Pressure Medium   Vasopneumatic Temperature  32     Manual Therapy   Manual therapy comments cross friction massage , Antegrade/ retrograde massage   Joint Mobilization Grade 1-2 PA- AP   Soft tissue mobilization ASTYM over the incision   Passive ROM knee flexion with PT forearm in popliteal space                PT Education - 12/30/16 1216    Education provided Yes   Education Details cross friction massage techniques, and retro/antegrade massage for swelling   Person(s) Educated Patient   Methods Explanation;Verbal cues   Comprehension Verbalized understanding;Verbal cues required          PT Short Term Goals - 12/30/16 1219      PT SHORT TERM GOAL #1   Title pt will be I with initial HEP given (01/12/2017)   Time 4   Period Weeks   Status Achieved     PT SHORT TERM GOAL #2   Title pt  will be able to verbalize and demo techniques to prevent/ reduce edema/ pain via RICE and HEP(01/12/2017)   Time 4   Period Weeks   Status Achieved     PT SHORT TERM GOAL #3   Title pt will increase L knee flexion to >/= 100 degrees and extension to >/= -5 degrees for functional progression ( 01/12/2017)   Time 4   Period Weeks   Status On-going     PT SHORT TERM GOAL #4   Title pt will be able to stand/ ambulate >/= 15 min with </= 5/10 pain with LRAD to promote functional mobililty (01/12/2017   Time 4   Period Weeks   Status On-going           PT Long Term Goals - 12/12/16 1205      PT LONG TERM GOAL #1   Title pt will be I with all HEP given as of last visit (02/06/2017)   Time 8   Period Weeks   Status New     PT LONG TERM GOAL #2   Title pt will increase L knee flexion to >/=125 degrees and extension to >/=-2 degrees with </= 2/10 pain for functional and efficient gait pattern (02/06/2017)   Time 8   Period Weeks    Status New     PT LONG TERM GOAL #3   Title pt will increase L knee/ hip strength to >/= 4/5 for prolonged walking/ standing activities to promote safety (02/06/2017)   Time 8   Period Weeks   Status New     PT LONG TERM GOAL #4   Title pt will be able to walk/ stand for >/= 30 minutes and navigate >/= 15 steps with >/=1 HHA for functional endurance and work related activities (02/06/2017)   Time 8   Period Weeks   Status New     PT LONG TERM GOAL #5   Title He will increase FOTO score by >/= 42% limited to demonstrate improvement in functionat Discharge (02/06/2017)   Time 8   Period Weeks   Status New               Plan - 12/30/16 1217    Clinical Impression Statement pt reports continued pain rated at 8/10 today. due to significant pain focused on manual techniques to reduce edema, and inflammation. continued vaso for edema combined with E-stim which he reported decreased pain post session .   PT Next Visit Plan soft tissue work on hamstrings.  updated HEP prone hangs,mobs, manual , retro-stepping quad activation, bike, cross friciton massage   PT Home Exercise Plan hamstring stretching, sidelying hip abduction with knee bent/ knee straight, heel slide with strap,   Consulted and Agree with Plan of Care Patient      Patient will benefit from skilled therapeutic intervention in order to improve the following deficits and impairments:  Abnormal gait, Pain, Difficulty walking, Hypomobility, Decreased strength, Improper body mechanics, Increased edema, Decreased endurance, Decreased activity tolerance, Decreased balance, Decreased range of motion, Increased fascial restricitons, Impaired flexibility, Postural dysfunction  Visit Diagnosis: Acute pain of left knee  Localized edema  Stiffness of left knee, not elsewhere classified  Muscle weakness (generalized)  Other abnormalities of gait and mobility     Problem List Patient Active Problem List   Diagnosis Date Noted   . OA (osteoarthritis) of knee 12/08/2016  . 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 Cervical facet joint syndrome 12/17/2015  .  Chronic bilateral low back pain without sciatica 12/17/2015  . Primary osteoarthritis of left knee 12/17/2015   Starr Lake PT, DPT, LAT, ATC  12/30/16  12:22 PM      [facility] [address] [place], [place], [address] Phone: [phone]   Fax:  [phone]  Name: EMILLIANO DILWORTH, MD MRN: [id number] Date of Birth: 07/03/1961

## 2017-01-01 ENCOUNTER — Ambulatory Visit: Payer: 59 | Admitting: Physical Therapy

## 2017-01-01 ENCOUNTER — Encounter: Payer: Self-pay | Admitting: Physical Therapy

## 2017-01-01 DIAGNOSIS — R2689 Other abnormalities of gait and mobility: Secondary | ICD-10-CM

## 2017-01-01 DIAGNOSIS — M6281 Muscle weakness (generalized): Secondary | ICD-10-CM | POA: Diagnosis not present

## 2017-01-01 DIAGNOSIS — M9904 Segmental and somatic dysfunction of sacral region: Secondary | ICD-10-CM | POA: Diagnosis not present

## 2017-01-01 DIAGNOSIS — M25662 Stiffness of left knee, not elsewhere classified: Secondary | ICD-10-CM

## 2017-01-01 DIAGNOSIS — M25562 Pain in left knee: Secondary | ICD-10-CM | POA: Diagnosis not present

## 2017-01-01 DIAGNOSIS — M546 Pain in thoracic spine: Secondary | ICD-10-CM | POA: Diagnosis not present

## 2017-01-01 DIAGNOSIS — M545 Low back pain: Secondary | ICD-10-CM | POA: Diagnosis not present

## 2017-01-01 DIAGNOSIS — R600 Localized edema: Secondary | ICD-10-CM

## 2017-01-01 DIAGNOSIS — M9903 Segmental and somatic dysfunction of lumbar region: Secondary | ICD-10-CM | POA: Diagnosis not present

## 2017-01-01 NOTE — Therapy (Signed)
[place], [place], [address] Phone: [phone]   Fax:  [phone]  Physical Therapy Treatment  Patient Details  Name: Daniel SANDATE, [name] MRN: [id number] Date of Birth: [birth year] Referring Provider: [name] [name]  Encounter Date: 01/01/2017      PT End of Session - 01/01/17 1124    Visit Number 8   Number of Visits 20   Date for PT Re-Evaluation 02/06/17   PT Start Time 0092   PT Stop Time 1112   PT Time Calculation (min) 57 min   Activity Tolerance Patient tolerated treatment well   Behavior During Therapy [facility] for tasks assessed/performed      Past Medical History:  Diagnosis Date  . Arthritis   . BPH (benign prostatic hyperplasia)   . Bronchitis   . DDD (degenerative disc disease), cervical   . Diabetes mellitus without complication ([place])    type 2  . DJD (degenerative joint disease)   . Headache    migraines  . Hyperlipidemia   . Pneumonia   . Sinusitis     Past Surgical History:  Procedure Laterality Date  . Hip Resurface  2006  . MENISCUS REPAIR  2008, 1993  . SLAP Tear Repair  2008  . TONSILLECTOMY    . TOTAL KNEE ARTHROPLASTY Left 12/08/2016   Procedure: LEFT TOTAL KNEE ARTHROPLASTY;  Surgeon: [name], [name];  Location: [facility];  Service: Orthopedics;  Laterality: Left;    There were no vitals filed for this visit.      Subjective Assessment - 01/01/17 1020    Subjective "what we did the other day really helped alot"    Currently in Pain? Yes   Pain Score 6    Pain Location Knee   Pain Orientation Left   Pain Descriptors / Indicators Aching   Pain Type Surgical pain   Pain Onset 1 to 4 weeks ago   Pain Frequency Constant   Aggravating Factors  bending, walking/ standing    Pain Relieving Factors ice, meds                         [facility] Adult PT Treatment/Exercise - 01/01/17 0001      Knee/Hip Exercises: Supine   Short Arc Quad Sets  Strengthening;Left;2 sets;15 reps  with ball squeeze to facilitate VMO activation   Straight Leg Raises 2 sets;10 reps  with ER     Vasopneumatic   Number Minutes Vasopneumatic  10 minutes   Vasopnuematic Location  Knee   Vasopneumatic Pressure Medium   Vasopneumatic Temperature  32     Manual Therapy   Manual therapy comments manual trigger point release over hamstrings and vastus lateralis   Joint Mobilization seated PA mobs with distraction, patellar mobs grade 2-3 pushing medially, superiorly/inferiorly  seated on high low table   Passive ROM knee flexion with PT forearm in popliteal space                  PT Short Term Goals - 12/30/16 1219      PT SHORT TERM GOAL #1   Title pt will be I with initial HEP given (01/12/2017)   Time 4   Period Weeks   Status Achieved     PT SHORT TERM GOAL #2   Title pt will be able to verbalize and demo techniques to prevent/ reduce edema/ pain via RICE and HEP(01/12/2017)   Time 4   Period Weeks  Status Achieved     PT SHORT TERM GOAL #3   Title pt will increase L knee flexion to >/= 100 degrees and extension to >/= -5 degrees for functional progression ( 01/12/2017)   Time 4   Period Weeks   Status On-going     PT SHORT TERM GOAL #4   Title pt will be able to stand/ ambulate >/= 15 min with </= 5/10 pain with LRAD to promote functional mobililty (01/12/2017   Time 4   Period Weeks   Status On-going           PT Long Term Goals - 12/12/16 1205      PT LONG TERM GOAL #1   Title pt will be I with all HEP given as of last visit (02/06/2017)   Time 8   Period Weeks   Status New     PT LONG TERM GOAL #2   Title pt will increase L knee flexion to >/=125 degrees and extension to >/=-2 degrees with </= 2/10 pain for functional and efficient gait pattern (02/06/2017)   Time 8   Period Weeks   Status New     PT LONG TERM GOAL #3   Title pt will increase L knee/ hip strength to >/= 4/5 for prolonged walking/ standing  activities to promote safety (02/06/2017)   Time 8   Period Weeks   Status New     PT LONG TERM GOAL #4   Title pt will be able to walk/ stand for >/= 30 minutes and navigate >/= 15 steps with >/=1 HHA for functional endurance and work related activities (02/06/2017)   Time 8   Period Weeks   Status New     PT LONG TERM GOAL #5   Title He will increase FOTO score by >/= 42% limited to demonstrate improvement in functionat Discharge (02/06/2017)   Time 8   Period Weeks   Status New               Plan - 01/01/17 1125    Clinical Impression Statement pt reported some relief since last session but conitnued pain inthe lateral aspect of the knee. continued focus on manual techniques to reduce tightness of hamstrings and vastus lateralis and promote VMO activation. Mobs to increase flexion which he was able to get to 110 post session. continued vaso for pain and swelling which he rpeorted 4/10 pain post session.    PT Next Visit Plan  gait training in // for [facility]. soft tissue work on hamstrings.  updated HEP prone hangs,mobs, manual , retro-stepping quad activation, bike, cross friciton massage   PT Home Exercise Plan hamstring stretching, sidelying hip abduction with knee bent/ knee straight, heel slide with strap,      Patient will benefit from skilled therapeutic intervention in order to improve the following deficits and impairments:  Abnormal gait, Pain, Difficulty walking, Hypomobility, Decreased strength, Improper body mechanics, Increased edema, Decreased endurance, Decreased activity tolerance, Decreased balance, Decreased range of motion, Increased fascial restricitons, Impaired flexibility, Postural dysfunction  Visit Diagnosis: Acute pain of left knee  Localized edema  Stiffness of left knee, not elsewhere classified  Muscle weakness (generalized)  Other abnormalities of gait and mobility     Problem List Patient Active Problem List   Diagnosis Date Noted  . OA  (osteoarthritis) of knee 12/08/2016  . 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 Cervical facet joint syndrome 12/17/2015  . Chronic bilateral low back pain  without sciatica 12/17/2015  . Primary osteoarthritis of left knee 12/17/2015   Starr Lake PT, DPT, LAT, ATC  01/01/17  11:29 AM      [facility] [address] [place], [place], [address] Phone: [phone]   Fax:  [phone]  Name: Daniel LIGHTNER, [name] MRN: [id number] Date of Birth: 1961/05/25

## 2017-01-02 ENCOUNTER — Ambulatory Visit: Payer: 59 | Admitting: Physical Therapy

## 2017-01-02 ENCOUNTER — Encounter: Payer: Self-pay | Admitting: Physical Therapy

## 2017-01-02 DIAGNOSIS — R600 Localized edema: Secondary | ICD-10-CM

## 2017-01-02 DIAGNOSIS — M25562 Pain in left knee: Secondary | ICD-10-CM | POA: Diagnosis not present

## 2017-01-02 DIAGNOSIS — M6281 Muscle weakness (generalized): Secondary | ICD-10-CM | POA: Diagnosis not present

## 2017-01-02 DIAGNOSIS — R2689 Other abnormalities of gait and mobility: Secondary | ICD-10-CM

## 2017-01-02 DIAGNOSIS — M25662 Stiffness of left knee, not elsewhere classified: Secondary | ICD-10-CM

## 2017-01-02 NOTE — Therapy (Signed)
[place], [place], [address] Phone: [phone]   Fax:  [phone]  Physical Therapy Treatment  Patient Details  Name: Daniel WASHKO, MD MRN: [id number] Date of Birth: [birth year] Referring Provider: [name] MD  Encounter Date: 01/02/2017      PT End of Session - 01/02/17 1213    Visit Number 9   Number of Visits 20   Date for PT Re-Evaluation 02/06/17   PT Start Time 1100   PT Stop Time 1210   PT Time Calculation (min) 70 min   Activity Tolerance Patient tolerated treatment well   Behavior During Therapy [facility] for tasks assessed/performed      Past Medical History:  Diagnosis Date  . Arthritis   . BPH (benign prostatic hyperplasia)   . Bronchitis   . DDD (degenerative disc disease), cervical   . Diabetes mellitus without complication ([place])    type 2  . DJD (degenerative joint disease)   . Headache    migraines  . Hyperlipidemia   . Pneumonia   . Sinusitis     Past Surgical History:  Procedure Laterality Date  . Hip Resurface  2006  . MENISCUS REPAIR  2008, 1993  . SLAP Tear Repair  2008  . TONSILLECTOMY    . 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      Subjective Assessment - 01/02/17 1109    Subjective "I was feeling better today since the last visit but have had more hip flexor soreness"    Currently in Pain? Yes   Pain Score 4             [facility] PT Assessment - 01/02/17 0001      Ambulation/Gait   Gait Comments exaggerated gait with SPC 4 x forward/ backward in //, 4 x 40 ft with [facility]                     [facility] Adult PT Treatment/Exercise - 01/02/17 0001      Knee/Hip Exercises: Stretches   Hip Flexor Stretch 3 reps;30 seconds     Knee/Hip Exercises: Aerobic   Recumbent  Bike full revoluations x 6 min  lowering seat ever 2 min to increase flexion     Knee/Hip Exercises: Standing   Other Standing Knee Exercises TKE 2 x 10   with red theraband     Knee/Hip Exercises: Seated   Hamstring Curl Left;2 sets;10 reps  with red theraband     Knee/Hip Exercises: Supine   Short Arc Quad Sets Strengthening;Left;2 sets;15 reps  with ball squeeze for VMO activation     Vasopneumatic   Number Minutes Vasopneumatic  10 minutes   Vasopnuematic Location  Knee   Vasopneumatic Pressure Medium   Vasopneumatic Temperature  32     Manual Therapy   Joint Mobilization seated PA mobs with distraction, patellar mobs grade 2-3 pushing medially, superiorly/inferiorly   Passive ROM knee flexion with PT forearm in popliteal space                PT Education - 01/02/17 1213    Education provided Yes   Education Details gait training with equal stride and exaggerated heel strike/ toe off to promote gait efficiency. proper height for DME equipment   Person(s) Educated Patient  Methods Explanation;Verbal cues;Handout;Demonstration   Comprehension Verbalized understanding;Verbal cues required;Returned demonstration          PT Short Term Goals - 12/30/16 1219      PT SHORT TERM GOAL #1   Title pt will be I with initial HEP given (01/12/2017)   Time 4   Period Weeks   Status Achieved     PT SHORT TERM GOAL #2   Title pt will be able to verbalize and demo techniques to prevent/ reduce edema/ pain via RICE and HEP(01/12/2017)   Time 4   Period Weeks   Status Achieved     PT SHORT TERM GOAL #3   Title pt will increase L knee flexion to >/= 100 degrees and extension to >/= -5 degrees for functional progression ( 01/12/2017)   Time 4   Period Weeks   Status On-going     PT SHORT TERM GOAL #4   Title pt will be able to stand/ ambulate >/= 15 min with </= 5/10 pain with LRAD to promote functional mobililty (01/12/2017   Time 4   Period Weeks   Status On-going            PT Long Term Goals - 12/12/16 1205      PT LONG TERM GOAL #1   Title pt will be I with all HEP given as of last visit (02/06/2017)   Time 8   Period Weeks   Status New     PT LONG TERM GOAL #2   Title pt will increase L knee flexion to >/=125 degrees and extension to >/=-2 degrees with </= 2/10 pain for functional and efficient gait pattern (02/06/2017)   Time 8   Period Weeks   Status New     PT LONG TERM GOAL #3   Title pt will increase L knee/ hip strength to >/= 4/5 for prolonged walking/ standing activities to promote safety (02/06/2017)   Time 8   Period Weeks   Status New     PT LONG TERM GOAL #4   Title pt will be able to walk/ stand for >/= 30 minutes and navigate >/= 15 steps with >/=1 HHA for functional endurance and work related activities (02/06/2017)   Time 8   Period Weeks   Status New     PT LONG TERM GOAL #5   Title He will increase FOTO score by >/= 42% limited to demonstrate improvement in functionat Discharge (02/06/2017)   Time 8   Period Weeks   Status New               Plan - 01/02/17 1215    Clinical Impression Statement continued working on knee mobility and strengthening in sitting/ standing. Gait training with SPC with exaggerated gait to facilitate efficent pattern. continued vaso for pain and inflammaiton.  reported decreased pain post session.   PT Next Visit Plan  gait training with  SPC. soft tissue work on Ambulance person. prone hangs,mobs, manual , retro-stepping quad activation, bike,    PT Home Exercise Plan hamstring stretching, sidelying hip abduction with knee bent/ knee straight, heel slide with strap,   Consulted and Agree with Plan of Care Patient      Patient will benefit from skilled therapeutic intervention in order to improve the following deficits and impairments:  Abnormal gait, Pain, Difficulty walking, Hypomobility, Decreased strength, Improper body mechanics, Increased edema, Decreased endurance,  Decreased activity tolerance, Decreased balance, Decreased range of motion, Increased fascial restricitons, Impaired flexibility, Postural  dysfunction  Visit Diagnosis: Acute pain of left knee  Localized edema  Stiffness of left knee, not elsewhere classified  Muscle weakness (generalized)  Other abnormalities of gait and mobility     Problem List Patient Active Problem List   Diagnosis Date Noted  . OA (osteoarthritis) of knee 12/08/2016  . 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 Cervical facet joint syndrome 12/17/2015  . Chronic bilateral low back pain without sciatica 12/17/2015  . Primary osteoarthritis of left knee 12/17/2015   Starr Lake PT, DPT, LAT, ATC  01/02/17  12:20 PM      [facility] [address] [place], [place], [address] Phone: [phone]   Fax:  [phone]  Name: Daniel BUCHBERGER, MD MRN: [id number] Date of Birth: 1961-08-20

## 2017-01-06 ENCOUNTER — Ambulatory Visit: Payer: 59 | Admitting: Physical Therapy

## 2017-01-06 ENCOUNTER — Encounter: Payer: Self-pay | Admitting: Physical Therapy

## 2017-01-06 DIAGNOSIS — R600 Localized edema: Secondary | ICD-10-CM | POA: Diagnosis not present

## 2017-01-06 DIAGNOSIS — M25562 Pain in left knee: Secondary | ICD-10-CM | POA: Diagnosis not present

## 2017-01-06 DIAGNOSIS — M25662 Stiffness of left knee, not elsewhere classified: Secondary | ICD-10-CM | POA: Diagnosis not present

## 2017-01-06 DIAGNOSIS — R2689 Other abnormalities of gait and mobility: Secondary | ICD-10-CM

## 2017-01-06 DIAGNOSIS — M6281 Muscle weakness (generalized): Secondary | ICD-10-CM

## 2017-01-06 MED FILL — METHOCARBAMOL 500 MG TABLET: 500 | 20 days supply | Qty: 60 | Fill #0

## 2017-01-06 NOTE — Therapy (Addendum)
[place], [place], [address] Phone: [phone]   Fax:  [phone]  Physical Therapy Treatment  Patient Details  Name: Daniel MARTI, [name] MRN: [id number] Date of Birth: [birth year] Referring Provider: [name] [name]  Encounter Date: 01/06/2017      PT End of Session - 01/06/17 1159    Visit Number 10   Number of Visits 20   Date for PT Re-Evaluation 02/06/17   PT Start Time 1100   PT Stop Time 1200   PT Time Calculation (min) 60 min   Activity Tolerance Patient tolerated treatment well   Behavior During Therapy [facility] for tasks assessed/performed      Past Medical History:  Diagnosis Date  . Arthritis   . BPH (benign prostatic hyperplasia)   . Bronchitis   . DDD (degenerative disc disease), cervical   . Diabetes mellitus without complication ([place])    type 2  . DJD (degenerative joint disease)   . Headache    migraines  . Hyperlipidemia   . Pneumonia   . Sinusitis     Past Surgical History:  Procedure Laterality Date  . Hip Resurface  2006  . MENISCUS REPAIR  2008, 1993  . SLAP Tear Repair  2008  . TONSILLECTOMY    . TOTAL KNEE ARTHROPLASTY Left 12/08/2016   Procedure: LEFT TOTAL KNEE ARTHROPLASTY;  Surgeon: [name], [name];  Location: [facility];  Service: Orthopedics;  Laterality: Left;    There were no vitals filed for this visit.      Subjective Assessment - 01/06/17 1107    Subjective "i was doing pretty good, but Sunday night the pain got up there and I had 10/10 pain. This morning I m doing better at a 5/10"   Currently in Pain? Yes   Pain Score 5    Pain Location Knee   Pain Orientation Left   Pain Descriptors / Indicators Aching   Pain Type Surgical pain   Pain Onset 1 to 4 weeks ago   Pain Frequency Intermittent   Aggravating Factors  bending, walking/ standing            [facility] PT Assessment - 01/06/17 0001      AROM   Left Knee Extension -10   Left Knee  Flexion 114                     [facility] Adult PT Treatment/Exercise - 01/06/17 0001      Knee/Hip Exercises: Stretches   Sports administrator 2 reps;30 seconds   Hip Flexor Stretch 3 reps;30 seconds     Knee/Hip Exercises: Aerobic   Recumbent Bike x 8 min   lowering seat every 2 for increased mobility     Knee/Hip Exercises: Machines for Strengthening   Total Gym Leg Press 2 x 10 20#  1 set up/down with, 1 set up with both down with LLE     Knee/Hip Exercises: Standing   Other Standing Knee Exercises TKE 2 x 10   with green theraband     Knee/Hip Exercises: Supine   Quad Sets Strengthening;Left;2 sets;10 reps  with manual overpressure     Knee/Hip Exercises: Sidelying   Hip ABduction 2 sets;10 reps     Vasopneumatic   Number Minutes Vasopneumatic  10 minutes   Vasopnuematic Location  Knee   Vasopneumatic Pressure High   Vasopneumatic Temperature  32     Manual Therapy   Manual therapy comments manual trigger point  release over hamstrings and vastus lateralis   Joint Mobilization seated PA mobs with distraction, patellar mobs grade 2-3 pushing medially, superiorly/inferiorly.  seated anterior mob with belt grade 3-4   Passive ROM knee flexion with PT forearm in popliteal space                  PT Short Term Goals - 12/30/16 1219      PT SHORT TERM GOAL #1   Title pt will be I with initial HEP given (01/12/2017)   Time 4   Period Weeks   Status Achieved     PT SHORT TERM GOAL #2   Title pt will be able to verbalize and demo techniques to prevent/ reduce edema/ pain via RICE and HEP(01/12/2017)   Time 4   Period Weeks   Status Achieved     PT SHORT TERM GOAL #3   Title pt will increase L knee flexion to >/= 100 degrees and extension to >/= -5 degrees for functional progression ( 01/12/2017)   Time 4   Period Weeks   Status On-going     PT SHORT TERM GOAL #4   Title pt will be able to stand/ ambulate >/= 15 min with </= 5/10 pain with LRAD to promote  functional mobililty (01/12/2017   Time 4   Period Weeks   Status On-going           PT Long Term Goals - 12/12/16 1205      PT LONG TERM GOAL #1   Title pt will be I with all HEP given as of last visit (02/06/2017)   Time 8   Period Weeks   Status New     PT LONG TERM GOAL #2   Title pt will increase L knee flexion to >/=125 degrees and extension to >/=-2 degrees with </= 2/10 pain for functional and efficient gait pattern (02/06/2017)   Time 8   Period Weeks   Status New     PT LONG TERM GOAL #3   Title pt will increase L knee/ hip strength to >/= 4/5 for prolonged walking/ standing activities to promote safety (02/06/2017)   Time 8   Period Weeks   Status New     PT LONG TERM GOAL #4   Title pt will be able to walk/ stand for >/= 30 minutes and navigate >/= 15 steps with >/=1 HHA for functional endurance and work related activities (02/06/2017)   Time 8   Period Weeks   Status New     PT LONG TERM GOAL #5   Title He will increase FOTO score by >/= 42% limited to demonstrate improvement in functionat Discharge (02/06/2017)   Time 8   Period Weeks   Status New       Plan:  Pt continues to progress with AROM increasing flexion to 114 but continues to demonstrate difficulty with quad activation and limited extension to -10. Focused on manual work today in to increase mobility and began leg press with begin eccentric strengthening. Continued with vaso increased the pressure to promote reduce edema.       Patient will benefit from skilled therapeutic intervention in order to improve the following deficits and impairments:     Visit Diagnosis: Localized edema  Stiffness of left knee, not elsewhere classified  Muscle weakness (generalized)  Acute pain of left knee  Other abnormalities of gait and mobility     Problem List Patient Active Problem List   Diagnosis Date Noted  .  OA (osteoarthritis) of knee 12/08/2016  . Intractable chronic migraine without  aura and without status migrainosus 12/17/2015  . Cervical facet joint syndrome 12/17/2015  . Chronic bilateral low back pain without sciatica 12/17/2015  . Primary osteoarthritis of left knee 12/17/2015   Starr Lake PT, DPT, LAT, ATC  01/06/17  12:00 PM      [place], [place], [address] Phone: [phone]   Fax:  [phone]  Name: Daniel MANTHEY, [name] MRN: [id number] Date of Birth: Oct 24, 1960

## 2017-01-07 ENCOUNTER — Encounter: Payer: Self-pay | Admitting: Physical Therapy

## 2017-01-07 ENCOUNTER — Ambulatory Visit: Payer: 59 | Admitting: Physical Therapy

## 2017-01-07 DIAGNOSIS — R2689 Other abnormalities of gait and mobility: Secondary | ICD-10-CM | POA: Diagnosis not present

## 2017-01-07 DIAGNOSIS — M25662 Stiffness of left knee, not elsewhere classified: Secondary | ICD-10-CM | POA: Diagnosis not present

## 2017-01-07 DIAGNOSIS — R600 Localized edema: Secondary | ICD-10-CM

## 2017-01-07 DIAGNOSIS — M6281 Muscle weakness (generalized): Secondary | ICD-10-CM

## 2017-01-07 DIAGNOSIS — M25562 Pain in left knee: Secondary | ICD-10-CM | POA: Diagnosis not present

## 2017-01-07 NOTE — Therapy (Signed)
[place], [place], [address] Phone: [phone]   Fax:  [phone]  Physical Therapy Treatment  Patient Details  Name: Daniel THEISS, [name] MRN: [id number] Date of Birth: [birth year] Referring Provider: [name] [name]  Encounter Date: 01/07/2017      PT End of Session - 01/07/17 1200    Visit Number 11   Number of Visits 20   Date for PT Re-Evaluation 02/06/17   PT Start Time 1100   PT Stop Time 1148   PT Time Calculation (min) 48 min   Activity Tolerance Patient tolerated treatment well   Behavior During Therapy [facility] for tasks assessed/performed      Past Medical History:  Diagnosis Date  . Arthritis   . BPH (benign prostatic hyperplasia)   . Bronchitis   . DDD (degenerative disc disease), cervical   . Diabetes mellitus without complication ([place])    type 2  . DJD (degenerative joint disease)   . Headache    migraines  . Hyperlipidemia   . Pneumonia   . Sinusitis     Past Surgical History:  Procedure Laterality Date  . Hip Resurface  2006  . MENISCUS REPAIR  2008, 1993  . SLAP Tear Repair  2008  . TONSILLECTOMY    . TOTAL KNEE ARTHROPLASTY Left 12/08/2016   Procedure: LEFT TOTAL KNEE ARTHROPLASTY;  Surgeon: [name], [name];  Location: [facility];  Service: Orthopedics;  Laterality: Left;    There were no vitals filed for this visit.      Subjective Assessment - 01/07/17 1054    Subjective "I pretty sore from exericse yesterday"   Currently in Pain? Yes   Pain Score 5    Pain Location Knee   Pain Orientation Left            [facility] PT Assessment - 01/07/17 0001      Observation/Other Assessments   Observations 66% limited                     [facility] Adult PT Treatment/Exercise - 01/07/17 0001      Knee/Hip Exercises: Stretches   Passive Hamstring Stretch 2 reps;30 seconds   Hip Flexor Stretch 2 reps;30 seconds     Knee/Hip Exercises: Standing   Other  Standing Knee Exercises resisted walking 2 plates 4 x forward/ backward, 3 x backward/ forward   Other Standing Knee Exercises TKE 2 x 10      Knee/Hip Exercises: Seated   Long Arc Quad 15 reps;Weights;2 sets  1 set without weight, 1 set with 2 1/2#.      Knee/Hip Exercises: Supine   Straight Leg Raises 10 reps;3 sets  with quad set, and rolled under knee for more quad activatio     Vasopneumatic   Number Minutes Vasopneumatic  6 minutes   Vasopnuematic Location  Knee   Vasopneumatic Pressure High   Vasopneumatic Temperature  32     Manual Therapy   Manual therapy comments manual trigger point release over hamstrings and vastus lateralis   Joint Mobilization seated PA mobs with distraction, patellar mobs grade 2-3 pushing medially, superiorly/inferiorly.  seated anterior mob with belt grade 3-4                  PT Short Term Goals - 12/30/16 1219      PT SHORT TERM GOAL #1   Title pt will be I with initial HEP given (01/12/2017)   Time 4  Period Weeks   Status Achieved     PT SHORT TERM GOAL #2   Title pt will be able to verbalize and demo techniques to prevent/ reduce edema/ pain via RICE and HEP(01/12/2017)   Time 4   Period Weeks   Status Achieved     PT SHORT TERM GOAL #3   Title pt will increase L knee flexion to >/= 100 degrees and extension to >/= -5 degrees for functional progression ( 01/12/2017)   Time 4   Period Weeks   Status On-going     PT SHORT TERM GOAL #4   Title pt will be able to stand/ ambulate >/= 15 min with </= 5/10 pain with LRAD to promote functional mobililty (01/12/2017   Time 4   Period Weeks   Status On-going           PT Long Term Goals - 12/12/16 1205      PT LONG TERM GOAL #1   Title pt will be I with all HEP given as of last visit (02/06/2017)   Time 8   Period Weeks   Status New     PT LONG TERM GOAL #2   Title pt will increase L knee flexion to >/=125 degrees and extension to >/=-2 degrees with </= 2/10 pain for  functional and efficient gait pattern (02/06/2017)   Time 8   Period Weeks   Status New     PT LONG TERM GOAL #3   Title pt will increase L knee/ hip strength to >/= 4/5 for prolonged walking/ standing activities to promote safety (02/06/2017)   Time 8   Period Weeks   Status New     PT LONG TERM GOAL #4   Title pt will be able to walk/ stand for >/= 30 minutes and navigate >/= 15 steps with >/=1 HHA for functional endurance and work related activities (02/06/2017)   Time 8   Period Weeks   Status New     PT LONG TERM GOAL #5   Title He will increase FOTO score by >/= 42% limited to demonstrate improvement in functionat Discharge (02/06/2017)   Time 8   Period Weeks   Status New               Plan - 01/07/17 1155    Clinical Impression Statement pt reported decreased pain today and last night. He reported decreased tightness/ knee pain today compared to previous session. focused manual on extension mobs with rotation for screw home mechanism. He was able to do resisted walking with [facility] required CGA for safety. continued vaso post session for pain and swelling which he reported feeling better.   PT Next Visit Plan AROM/ strength pt sees [name] on 4/3.  gait training with  SPC/ resisted walking. soft tissue work on Ambulance person. anterior mobs with external rotation , manual , bike,    PT Home Exercise Plan hamstring stretching, sidelying hip abduction with knee bent/ knee straight, heel slide with strap,   Consulted and Agree with Plan of Care Patient      Patient will benefit from skilled therapeutic intervention in order to improve the following deficits and impairments:  Abnormal gait, Pain, Difficulty walking, Hypomobility, Decreased strength, Improper body mechanics, Increased edema, Decreased endurance, Decreased activity tolerance, Decreased balance, Decreased range of motion, Increased fascial restricitons, Impaired flexibility, Postural dysfunction  Visit  Diagnosis: Localized edema  Stiffness of left knee, not elsewhere classified  Muscle weakness (generalized)  Acute pain of  left knee  Other abnormalities of gait and mobility     Problem List Patient Active Problem List   Diagnosis Date Noted  . OA (osteoarthritis) of knee 12/08/2016  . 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 Cervical facet joint syndrome 12/17/2015  . Chronic bilateral low back pain without sciatica 12/17/2015  . Primary osteoarthritis of left knee 12/17/2015   Starr Lake PT, DPT, LAT, ATC  01/07/17  12:04 PM      [facility] [address] [place], [place], [address] Phone: [phone]   Fax:  [phone]  Name: Daniel RICHART, [name] MRN: [id number] Date of Birth: 30-Jan-1961

## 2017-01-08 ENCOUNTER — Encounter: Payer: Self-pay | Admitting: Physical Therapy

## 2017-01-08 ENCOUNTER — Ambulatory Visit: Payer: 59 | Admitting: Physical Therapy

## 2017-01-08 DIAGNOSIS — M25662 Stiffness of left knee, not elsewhere classified: Secondary | ICD-10-CM

## 2017-01-08 DIAGNOSIS — R600 Localized edema: Secondary | ICD-10-CM | POA: Diagnosis not present

## 2017-01-08 DIAGNOSIS — M6281 Muscle weakness (generalized): Secondary | ICD-10-CM | POA: Diagnosis not present

## 2017-01-08 DIAGNOSIS — M25562 Pain in left knee: Secondary | ICD-10-CM | POA: Diagnosis not present

## 2017-01-08 DIAGNOSIS — R2689 Other abnormalities of gait and mobility: Secondary | ICD-10-CM

## 2017-01-08 NOTE — Therapy (Signed)
[place], [place], [address] Phone: [phone]   Fax:  [phone]  Physical Therapy Treatment  Patient Details  Name: Daniel NIGH, [name] MRN: [id number] Date of Birth: 1960-10-25 Referring Provider: [name] [name]  Encounter Date: 01/08/2017      PT End of Session - 01/08/17 1157    Visit Number 12   Date for PT Re-Evaluation 02/06/17   PT Start Time 1100   PT Stop Time 1150   PT Time Calculation (min) 50 min   Activity Tolerance Patient tolerated treatment well   Behavior During Therapy [facility] for tasks assessed/performed      Past Medical History:  Diagnosis Date  . Arthritis   . BPH (benign prostatic hyperplasia)   . Bronchitis   . DDD (degenerative disc disease), cervical   . Diabetes mellitus without complication ([place])    type 2  . DJD (degenerative joint disease)   . Headache    migraines  . Hyperlipidemia   . Pneumonia   . Sinusitis     Past Surgical History:  Procedure Laterality Date  . Hip Resurface  2006  . MENISCUS REPAIR  2008, 1993  . SLAP Tear Repair  2008  . TONSILLECTOMY    . TOTAL KNEE ARTHROPLASTY Left 12/08/2016   Procedure: LEFT TOTAL KNEE ARTHROPLASTY;  Surgeon: [name], [name];  Location: [facility];  Service: Orthopedics;  Laterality: Left;    There were no vitals filed for this visit.      Subjective Assessment - 01/08/17 1058    Subjective "The knee is feeling about a 5-6/10 and some soreness in the R upper trap and R low back"    Currently in Pain? Yes   Pain Score 6    Pain Location Knee   Pain Orientation Left   Pain Type Surgical pain   Pain Onset 1 to 4 weeks ago   Pain Frequency Intermittent   Aggravating Factors  bending, walking/ standing   Pain Relieving Factors ice, meds, stretching            [facility] PT Assessment - 01/08/17 0001      AROM   Left Knee Extension -13   Left Knee Flexion 122                     [facility]  Adult PT Treatment/Exercise - 01/08/17 0001      Knee/Hip Exercises: Stretches   Passive Hamstring Stretch 2 reps;30 seconds   Hip Flexor Stretch 2 reps;30 seconds   Other Knee/Hip Stretches prone hangs holding 5 min  performed concurrent with low back DTM     Knee/Hip Exercises: Aerobic   Recumbent Bike x 10 min L3  lowering seat every 2 min for knee mobility     Knee/Hip Exercises: Supine   Quad Sets 1 set;15 reps;Left   Straight Leg Raises 2 sets;10 reps  with quad set     Knee/Hip Exercises: Sidelying   Hip ABduction 2 sets;15 reps  3#     Modalities   Modalities Moist Heat     Moist Heat Therapy   Number Minutes Moist Heat 10 Minutes   Moist Heat Location Knee     Manual Therapy   Manual Therapy Taping   Manual therapy comments manual trigger point release over R lumbar paraspinals and QL/ upper trap   Joint Mobilization seated PA mobs with distraction, patellar mobs grade 2-3 pushing medially, superiorly/inferiorly.  seated anterior mob with belt  grade 3-4   Soft tissue mobilization DTM over the R lumbar paraspinals   Kinesiotex Edema;Create Space     Kinesiotix   Edema over the L knee                PT Education - 01/08/17 1157    Education provided Yes   Education Details avoiding shoulder hiking with SPC, shoulder pain and low back pain likely related to antalgic gait pattern due to compensation.    Person(s) Educated Patient   Methods Explanation;Verbal cues   Comprehension Verbalized understanding;Verbal cues required          PT Short Term Goals - 01/08/17 1203      PT SHORT TERM GOAL #1   Title pt will be I with initial HEP given (01/12/2017)   Time 4   Period Weeks   Status Achieved     PT SHORT TERM GOAL #2   Title pt will be able to verbalize and demo techniques to prevent/ reduce edema/ pain via RICE and HEP(01/12/2017)   Time 4   Period Weeks   Status Achieved     PT SHORT TERM GOAL #3   Title pt will increase L knee flexion to  >/= 100 degrees and extension to >/= -5 degrees for functional progression ( 01/12/2017)   Baseline met flexion goal, -13 with extension   Time 4   Status Partially Met     PT SHORT TERM GOAL #4   Title pt will be able to stand/ ambulate >/= 15 min with </= 5/10 pain with LRAD to promote functional mobililty (01/12/2017   Time 4   Period Weeks   Status Partially Met           PT Long Term Goals - 01/08/17 1204      PT LONG TERM GOAL #1   Title pt will be I with all HEP given as of last visit (02/06/2017)   Time 8   Period Weeks   Status On-going     PT LONG TERM GOAL #2   Title pt will increase L knee flexion to >/=125 degrees and extension to >/=-2 degrees with </= 2/10 pain for functional and efficient gait pattern (02/06/2017)   Time 8   Period Weeks   Status On-going     PT LONG TERM GOAL #3   Title pt will increase L knee/ hip strength to >/= 4/5 for prolonged walking/ standing activities to promote safety (02/06/2017)   Time 8   Period Weeks   Status Unable to assess     PT LONG TERM GOAL #4   Title pt will be able to walk/ stand for >/= 30 minutes and navigate >/= 15 steps with >/=1 HHA for functional endurance and work related activities (02/06/2017)   Period Weeks   Status On-going     PT LONG TERM GOAL #5   Title He will increase FOTO score by >/= 42% limited to demonstrate improvement in functionat Discharge (02/06/2017)   Time 8   Period Weeks   Status On-going               Plan - 01/08/17 1158    Clinical Impression Statement 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 continued working on hip strengthening and quad strengthening which he is able to do well with minimal cuing for form. educated on compensation strategies which is causing low back pain and shoulder hiking with  his SPC. trialed KT tape for edema and utilized MHP today to promote edema reduction.    PT Next Visit Plan gait  training with  SPC/ resisted walking. soft tissue work on Ambulance person. anterior mobs with external rotation , manual , bike,    Consulted and Agree with Plan of Care Patient      Patient will benefit from skilled therapeutic intervention in order to improve the following deficits and impairments:     Visit Diagnosis: Localized edema  Stiffness of left knee, not elsewhere classified  Muscle weakness (generalized)  Acute pain of left knee  Other abnormalities of gait and mobility     Problem List Patient Active Problem List   Diagnosis Date Noted  . OA (osteoarthritis) of knee 12/08/2016  . 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 Cervical facet joint syndrome 12/17/2015  . Chronic bilateral low back pain without sciatica 12/17/2015  . Primary osteoarthritis of left knee 12/17/2015   Starr Lake PT, DPT, LAT, ATC  01/08/17  12:15 PM      [facility] [address] [place], [place], [address] Phone: [phone]   Fax:  [phone]  Name: Daniel RAWLINSON, [name] MRN: [id number] Date of Birth: 10-06-1961

## 2017-01-13 ENCOUNTER — Ambulatory Visit: Payer: 59 | Admitting: Physical Therapy

## 2017-01-13 DIAGNOSIS — Z471 Aftercare following joint replacement surgery: Secondary | ICD-10-CM | POA: Diagnosis not present

## 2017-01-13 DIAGNOSIS — Z96652 Presence of left artificial knee joint: Secondary | ICD-10-CM | POA: Diagnosis not present

## 2017-01-15 ENCOUNTER — Ambulatory Visit: Payer: 59 | Attending: Orthopedic Surgery | Admitting: Physical Therapy

## 2017-01-15 DIAGNOSIS — M6281 Muscle weakness (generalized): Secondary | ICD-10-CM | POA: Diagnosis not present

## 2017-01-15 DIAGNOSIS — M25562 Pain in left knee: Secondary | ICD-10-CM | POA: Diagnosis not present

## 2017-01-15 DIAGNOSIS — M545 Low back pain: Secondary | ICD-10-CM | POA: Diagnosis not present

## 2017-01-15 DIAGNOSIS — M9903 Segmental and somatic dysfunction of lumbar region: Secondary | ICD-10-CM | POA: Diagnosis not present

## 2017-01-15 DIAGNOSIS — R600 Localized edema: Secondary | ICD-10-CM | POA: Diagnosis not present

## 2017-01-15 DIAGNOSIS — R2689 Other abnormalities of gait and mobility: Secondary | ICD-10-CM | POA: Insufficient documentation

## 2017-01-15 DIAGNOSIS — M25662 Stiffness of left knee, not elsewhere classified: Secondary | ICD-10-CM | POA: Insufficient documentation

## 2017-01-15 DIAGNOSIS — M546 Pain in thoracic spine: Secondary | ICD-10-CM | POA: Diagnosis not present

## 2017-01-15 DIAGNOSIS — M9904 Segmental and somatic dysfunction of sacral region: Secondary | ICD-10-CM | POA: Diagnosis not present

## 2017-01-15 NOTE — Therapy (Signed)
[place], [place], [address] Phone: [phone]   Fax:  [phone]  Physical Therapy Treatment / Discharge Summary  Patient Details  Name: Daniel BEEVER, [name] MRN: [id number] Date of Birth: 1960/12/30 Referring Provider: [name] [name]  Encounter Date: 01/15/2017      PT End of Session - 01/15/17 1247    Visit Number 13   Number of Visits 20   PT Start Time 5102   PT Stop Time 1232   PT Time Calculation (min) 47 min   Activity Tolerance Patient tolerated treatment well   Behavior During Therapy [facility] for tasks assessed/performed      Past Medical History:  Diagnosis Date  . Arthritis   . BPH (benign prostatic hyperplasia)   . Bronchitis   . DDD (degenerative disc disease), cervical   . Diabetes mellitus without complication ([place])    type 2  . DJD (degenerative joint disease)   . Headache    migraines  . Hyperlipidemia   . Pneumonia   . Sinusitis     Past Surgical History:  Procedure Laterality Date  . Hip Resurface  2006  . MENISCUS REPAIR  2008, 1993  . SLAP Tear Repair  2008  . TONSILLECTOMY    . TOTAL KNEE ARTHROPLASTY Left 12/08/2016   Procedure: LEFT TOTAL KNEE ARTHROPLASTY;  Surgeon: [name], [name];  Location: [facility];  Service: Orthopedics;  Laterality: Left;    There were no vitals filed for this visit.          [facility] PT Assessment - 01/15/17 0001      Observation/Other Assessments   Observations 62% limited     AROM   Left Knee Extension -3   Left Knee Flexion 130     Strength   Right/Left Knee Right;Left   Left Knee Flexion 4+/5   Left Knee Extension 4+/5                     [facility] Adult PT Treatment/Exercise - 01/15/17 0001      Knee/Hip Exercises: Machines for Strengthening   Cybex Knee Extension 2 x 10 25#  verbal cues for using both legs and controlled eccentric    Cybex Knee Flexion 2 x 10 35#  with controlled eccentics   Total Gym Leg  Press 2 x 15 50#  using both legs     Knee/Hip Exercises: Standing   Heel Raises 1 set;20 reps   Hip Abduction Stengthening;Both;2 sets;10 reps;Knee straight   Abduction Limitations verbal cues to avoid trunk lean compensation   Hip Extension Stengthening;Both;2 sets;10 reps;Knee straight   Extension Limitations verbal cues to avoid trunk lean compensation                PT Education - 01/15/17 1246    Education provided Yes   Education Details updated HEP regarding gym exericses, reviewed how to progress exercises appropriately in order to progress function and strength.    Person(s) Educated Patient   Methods Explanation;Verbal cues;Handout   Comprehension Verbalized understanding;Verbal cues required          PT Short Term Goals - 01/15/17 1248      PT SHORT TERM GOAL #1   Title pt will be I with initial HEP given (01/12/2017)   Time 4   Period Weeks   Status Achieved     PT SHORT TERM GOAL #2   Title pt will be able to verbalize and demo techniques to  prevent/ reduce edema/ pain via RICE and HEP(01/12/2017)   Time 4   Period Weeks   Status Achieved     PT SHORT TERM GOAL #3   Title pt will increase L knee flexion to >/= 100 degrees and extension to >/= -5 degrees for functional progression ( 01/12/2017)   Time 4   Period Weeks   Status Achieved     PT SHORT TERM GOAL #4   Title pt will be able to stand/ ambulate >/= 15 min with </= 5/10 pain with LRAD to promote functional mobililty (01/12/2017   Time 4   Period Weeks   Status Achieved           PT Long Term Goals - 01/15/17 1248      PT LONG TERM GOAL #1   Title pt will be I with all HEP given as of last visit (02/06/2017)   Time 8   Period Weeks   Status Achieved     PT LONG TERM GOAL #2   Title pt will increase L knee flexion to >/=125 degrees and extension to >/=-2 degrees with </= 2/10 pain for functional and efficient gait pattern (02/06/2017)   Time 8   Period Weeks   Status Achieved     PT  LONG TERM GOAL #3   Title pt will increase L knee/ hip strength to >/= 4/5 for prolonged walking/ standing activities to promote safety (02/06/2017)   Time 8   Period Weeks   Status Achieved     PT LONG TERM GOAL #4   Title pt will be able to walk/ stand for >/= 30 minutes and navigate >/= 15 steps with >/=1 HHA for functional endurance and work related activities (02/06/2017)   Time 8   Period Weeks   Status Partially Met     PT LONG TERM GOAL #5   Title He will increase FOTO score by >/= 42% limited to demonstrate improvement in functionat Discharge (02/06/2017)   Time 8   Period Weeks   Status Partially Met               Plan - 01/15/17 1256    Clinical Impression Statement 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 He continues to exhibit limited endurance with prolonged CKC activities with report of "feeling unstable". he reported seeing his [name] which reported he is doing well. ROM today was -3 degrees extension and 130 degrees flexion. reviewed and performed gym exercises which he performed well requiring minimal cues for proper form. He has met or partially met all goals which he reports that he feels he is able to maintain and progress his current level of function.    PT Next Visit Plan D/C   PT Home Exercise Plan hamstring stretching, sidelying hip abduction with knee bent/ knee straight, heel slide with strap, gyme exercise and standing hip ther-ex.   Consulted and Agree with Plan of Care Patient      Patient will benefit from skilled therapeutic intervention in order to improve the following deficits and impairments:  Abnormal gait, Pain, Difficulty walking, Hypomobility, Decreased strength, Improper body mechanics, Increased edema, Decreased endurance, Decreased activity tolerance, Decreased balance, Decreased range of motion, Increased fascial restricitons, Impaired flexibility, Postural dysfunction  Visit Diagnosis: Localized edema  Stiffness of left knee, not  elsewhere classified  Muscle weakness (generalized)  Other abnormalities of gait and mobility  Acute pain of left knee     Problem List Patient Active Problem List  Diagnosis Date Noted  . OA (osteoarthritis) of knee 12/08/2016  . 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 Cervical facet joint syndrome 12/17/2015  . Chronic bilateral low back pain without sciatica 12/17/2015  . Primary osteoarthritis of left knee 12/17/2015   Starr Lake PT, DPT, LAT, ATC  01/15/17  1:07 PM      [facility] [address] [place], [place], [address] Phone: [phone]   Fax:  [phone]  Name: ATTILA MCCARTHY, [name] MRN: [id number] Date of Birth: Aug 27, 1961       PHYSICAL THERAPY DISCHARGE SUMMARY  Visits from Start of Care: 13  Current functional level related to goals / functional outcomes: See goals, FOTO 62% limited   Remaining deficits: See above assessment   Education / Equipment: HEP, posture/ lifting mechanics, theraband, edema reduction techniques, gym exercises.   Plan: Patient agrees to discharge.  Patient goals were partially met. Patient is being discharged due to meeting the stated rehab goals.  ?????     [name] PT, DPT, LAT, ATC  01/15/17  1:07 PM

## 2017-01-16 ENCOUNTER — Ambulatory Visit: Payer: 59 | Admitting: Physical Therapy

## 2017-01-19 ENCOUNTER — Encounter: Payer: 59 | Admitting: Physical Therapy

## 2017-01-20 ENCOUNTER — Ambulatory Visit: Payer: 59 | Admitting: Physical Therapy

## 2017-01-21 ENCOUNTER — Encounter: Payer: 59 | Admitting: Physical Therapy

## 2017-01-22 ENCOUNTER — Encounter: Payer: 59 | Admitting: Physical Therapy

## 2017-01-23 ENCOUNTER — Encounter: Payer: 59 | Admitting: Physical Therapy

## 2017-01-23 MED FILL — traMADol HCL 50 MG TABS: 50 | 30 days supply | Qty: 240 | Fill #1

## 2017-01-23 MED FILL — RIZATRIPTAN 5 MG ODT: 5 | 30 days supply | Qty: 18 | Fill #6

## 2017-01-23 MED FILL — oxyCODONE HCL 5 MG TABS: 5 | 20 days supply | Qty: 40 | Fill #0

## 2017-01-23 MED FILL — TAMSULOSIN HCL 0.4 MG CAP: 0.4 | 90 days supply | Qty: 180 | Fill #3

## 2017-01-23 MED FILL — diazePAM 5 MG TABS: 5 | 30 days supply | Qty: 60 | Fill #2

## 2017-01-23 MED FILL — CYCLOBENZAPRINE 10 MG TAB: 10 | 90 days supply | Qty: 270 | Fill #2

## 2017-01-23 MED FILL — METHOCARBAMOL 500 MG TABLET: 500 | 90 days supply | Qty: 270 | Fill #1

## 2017-01-23 MED FILL — metFORMIN HCL 1000 MG TABS: 1000 | 30 days supply | Qty: 60 | Fill #0

## 2017-01-23 MED FILL — BUTALBITAL/APAP/CAFFEINE TB: 50-325-40 | 10 days supply | Qty: 60 | Fill #2

## 2017-01-26 ENCOUNTER — Encounter: Payer: 59 | Admitting: Physical Therapy

## 2017-01-27 ENCOUNTER — Encounter: Payer: 59 | Admitting: Physical Therapy

## 2017-01-28 ENCOUNTER — Encounter: Payer: 59 | Admitting: Physical Therapy

## 2017-01-29 ENCOUNTER — Encounter: Payer: 59 | Admitting: Physical Therapy

## 2017-01-30 ENCOUNTER — Encounter: Payer: 59 | Admitting: Physical Therapy

## 2017-02-02 ENCOUNTER — Encounter: Payer: 59 | Admitting: Physical Therapy

## 2017-02-03 ENCOUNTER — Encounter: Payer: 59 | Admitting: Physical Therapy

## 2017-02-04 ENCOUNTER — Encounter: Payer: 59 | Admitting: Physical Therapy

## 2017-02-04 ENCOUNTER — Ambulatory Visit: Payer: 59 | Admitting: Physical Therapy

## 2017-02-04 DIAGNOSIS — M9904 Segmental and somatic dysfunction of sacral region: Secondary | ICD-10-CM | POA: Diagnosis not present

## 2017-02-04 DIAGNOSIS — M546 Pain in thoracic spine: Secondary | ICD-10-CM | POA: Diagnosis not present

## 2017-02-04 DIAGNOSIS — M545 Low back pain: Secondary | ICD-10-CM | POA: Diagnosis not present

## 2017-02-04 DIAGNOSIS — M9903 Segmental and somatic dysfunction of lumbar region: Secondary | ICD-10-CM | POA: Diagnosis not present

## 2017-02-10 MED FILL — LIDOCAINE 5% PATCH: 5 | 90 days supply | Qty: 180 | Fill #0

## 2017-02-13 MED FILL — CIALIS 5 MG TABLET: 5 | 90 days supply | Qty: 90 | Fill #1

## 2017-02-17 ENCOUNTER — Telehealth: Payer: Self-pay | Admitting: Physical Medicine & Rehabilitation

## 2017-02-17 ENCOUNTER — Encounter: Payer: Self-pay | Admitting: Internal Medicine

## 2017-02-17 NOTE — Telephone Encounter (Signed)
Pt called to make an appointment for Boxtox and requested to have a shoulder injection. Please advise.

## 2017-02-18 MED FILL — RIZATRIPTAN 5 MG ODT: 5 | 30 days supply | Qty: 18 | Fill #7

## 2017-02-18 MED FILL — TOPIRAMATE 25 MG TABLET: 25 | 90 days supply | Qty: 90 | Fill #2

## 2017-02-18 MED FILL — oxyCODONE HCL 5 MG TABS: 5 | 30 days supply | Qty: 180 | Fill #0

## 2017-02-23 MED FILL — CLINDAMYCIN HCL 150 MG CAPS: 150 | 7 days supply | Qty: 21 | Fill #0

## 2017-02-24 MED FILL — AMOXICILLIN 500 MG CAPSULE: 500 | 4 days supply | Qty: 16 | Fill #1

## 2017-02-26 DIAGNOSIS — H5213 Myopia, bilateral: Secondary | ICD-10-CM | POA: Diagnosis not present

## 2017-03-03 ENCOUNTER — Encounter: Payer: 59 | Attending: Physical Medicine & Rehabilitation

## 2017-03-03 ENCOUNTER — Ambulatory Visit: Payer: 59 | Admitting: Physical Medicine & Rehabilitation

## 2017-03-03 ENCOUNTER — Encounter: Payer: Self-pay | Admitting: Physical Medicine & Rehabilitation

## 2017-03-03 ENCOUNTER — Ambulatory Visit (HOSPITAL_BASED_OUTPATIENT_CLINIC_OR_DEPARTMENT_OTHER): Payer: 59 | Admitting: Physical Medicine & Rehabilitation

## 2017-03-03 VITALS — BP 115/83 | HR 87

## 2017-03-03 DIAGNOSIS — M25511 Pain in right shoulder: Secondary | ICD-10-CM | POA: Diagnosis not present

## 2017-03-03 DIAGNOSIS — G43719 Chronic migraine without aura, intractable, without status migrainosus: Secondary | ICD-10-CM | POA: Diagnosis not present

## 2017-03-03 MED FILL — PRAVASTATIN NA 40 MG TAB: 40 | 90 days supply | Qty: 90 | Fill #1

## 2017-03-03 MED FILL — metFORMIN HCL 1000 MG TABS: 1000 | 30 days supply | Qty: 60 | Fill #1

## 2017-03-03 MED FILL — BUTALBITAL/APAP/CAFFEINE TB: 50-325-40 | 10 days supply | Qty: 60 | Fill #3

## 2017-03-03 MED FILL — CLINDAMYCIN HCL 150 MG CAPS: 150 | 8 days supply | Qty: 32 | Fill #0

## 2017-03-03 NOTE — Patient Instructions (Signed)
You received a botulinum toxin injection for chronic headache prevention. You may have soreness in your injection site, you may use ice for 20-30 minutes every 2 hours to help with soreness.  Maximum effect of the injection will be at around 1 week. Other side effects include possible muscle weakness which will subside as the medication wears off.  Medication may be repeated every 3 months as needed. 

## 2017-03-03 NOTE — Progress Notes (Signed)
Botox injection for chronic migraine prophylaxis.  Indication: History of migraines with greater than 15 headaches per month despite trials of oral medications.  Informed consent was obtained after discussing risks and benefits of the procedure with the patient this included bleeding bruising infection as well as facial drooping, eyelid lag, systemic effects of Botox. Patient elects to proceed and has given written consent.  Patient placed in a seated position Dilution 50 units per cc  Muscles injected with dose  Procerus 5 units Corrugator 5 units bilateral Frontalis 5 units 2 injection sites on the right and 2 injection sites on the left Temporalis 5 units in 4 injection sites on the right and 4 injection sites on the left Occipitalis 5 units into 3 injections sites on the right and 3 injection sites on the left  Cervical paraspinals 5 units into 2 injection sites on the right and 2 injection sites on the left trapezius 5 units into 3 injection sites on the right and 3 injection sites on the left Right levator scapula, 15 units Patient tolerated procedure well. Post procedure instructions given. Appointment for repeat injection in 3 months

## 2017-03-03 NOTE — Progress Notes (Signed)
Shoulder injection Right    Indication:Right Shoulder pain not relieved by medication management and other conservative care.  Informed consent was obtained after describing risks and benefits of the procedure with the patient, this includes bleeding, bruising, infection and medication side effects. The patient wishes to proceed and has given written consent. Patient was placed in a seated position. The Right shoulder was marked and prepped with betadine in the subacromial area. A 25-gauge 1-1/2 inch needle was inserted into the subacromial area. After negative draw back for blood, a solution containing 1 mL of 6 mg per ML betamethasone and 4 mL of 1% lidocaine was injected. A band aid was applied. The patient tolerated the procedure well. Post procedure instructions were given. 

## 2017-03-05 DIAGNOSIS — M9903 Segmental and somatic dysfunction of lumbar region: Secondary | ICD-10-CM | POA: Diagnosis not present

## 2017-03-05 DIAGNOSIS — M545 Low back pain: Secondary | ICD-10-CM | POA: Diagnosis not present

## 2017-03-05 DIAGNOSIS — M546 Pain in thoracic spine: Secondary | ICD-10-CM | POA: Diagnosis not present

## 2017-03-05 DIAGNOSIS — M9904 Segmental and somatic dysfunction of sacral region: Secondary | ICD-10-CM | POA: Diagnosis not present

## 2017-03-05 MED FILL — HYDROCODON-APAP 5-325: 5-325 | 3 days supply | Qty: 20 | Fill #0

## 2017-03-05 MED FILL — PENICILLIN VK 500 MG TABLET: 500 | 7 days supply | Qty: 28 | Fill #0

## 2017-03-10 DIAGNOSIS — N401 Enlarged prostate with lower urinary tract symptoms: Secondary | ICD-10-CM | POA: Diagnosis not present

## 2017-03-10 DIAGNOSIS — R3912 Poor urinary stream: Secondary | ICD-10-CM | POA: Diagnosis not present

## 2017-03-10 DIAGNOSIS — R351 Nocturia: Secondary | ICD-10-CM | POA: Diagnosis not present

## 2017-03-16 NOTE — Addendum Note (Signed)
Addendum  created 03/16/17 1137 by Myrtie Soman, MD   Sign clinical note

## 2017-03-25 ENCOUNTER — Encounter (INDEPENDENT_AMBULATORY_CARE_PROVIDER_SITE_OTHER): Payer: Self-pay

## 2017-03-25 ENCOUNTER — Encounter: Payer: Self-pay | Admitting: Internal Medicine

## 2017-03-25 ENCOUNTER — Ambulatory Visit (INDEPENDENT_AMBULATORY_CARE_PROVIDER_SITE_OTHER): Payer: 59 | Admitting: Internal Medicine

## 2017-03-25 ENCOUNTER — Other Ambulatory Visit (INDEPENDENT_AMBULATORY_CARE_PROVIDER_SITE_OTHER): Payer: 59

## 2017-03-25 VITALS — BP 112/58 | HR 70 | Ht 72.0 in | Wt 283.0 lb

## 2017-03-25 DIAGNOSIS — K625 Hemorrhage of anus and rectum: Secondary | ICD-10-CM

## 2017-03-25 DIAGNOSIS — K5900 Constipation, unspecified: Secondary | ICD-10-CM

## 2017-03-25 DIAGNOSIS — R1032 Left lower quadrant pain: Secondary | ICD-10-CM

## 2017-03-25 LAB — BUN: BUN: 19 mg/dL (ref 6–23)

## 2017-03-25 LAB — CREATININE, SERUM: Creatinine, Ser: 1.04 mg/dL (ref 0.40–1.50)

## 2017-03-25 MED ORDER — NA SULFATE-K SULFATE-MG SULF 17.5-3.13-1.6 GM/177ML PO SOLN: 1.0000 | Freq: Once | ORAL | 0 refills | Status: AC

## 2017-03-25 MED FILL — SUPREP BOWEL PREP KIT: 17.5-3.13-1 | 1 days supply | Qty: 354 | Fill #0

## 2017-03-25 NOTE — Patient Instructions (Addendum)
Your physician has requested that you go to the basement for lab work before leaving today.   You have been scheduled for a CT scan of the abdomen and pelvis at [facility] ([address]---this is in the same building as Press [facility]).   You are scheduled on 04/01/17 at 4:00pm. You should arrive 15 minutes prior to your appointment time for registration. Please follow the written instructions below on the day of your exam:  WARNING: IF YOU ARE ALLERGIC TO IODINE/X-RAY DYE, PLEASE NOTIFY RADIOLOGY IMMEDIATELY AT [phone]! YOU WILL BE GIVEN A 13 HOUR PREMEDICATION PREP.  1) Do not eat or drink anything after 12:00pm (4 hours prior to your test) 2) You have been given 2 bottles of oral contrast to drink. The solution may taste               better if refrigerated, but do NOT add ice or any other liquid to this solution. Shake             well before drinking.    Drink 1 bottle of contrast @ 2:00pm (2 hours prior to your exam)  Drink 1 bottle of contrast @ 3:00pm (1 hour prior to your exam)  You may take any medications as prescribed with a small amount of water except for the following: Metformin, Glucophage, Glucovance, Avandamet, Riomet, Fortamet, Actoplus Met, Janumet, Glumetza or Metaglip. The above medications must be held the day of the exam AND 48 hours after the exam.  The purpose of you drinking the oral contrast is to aid in the visualization of your intestinal tract. The contrast solution may cause some diarrhea. Before your exam is started, you will be given a small amount of fluid to drink. Depending on your individual set of symptoms, you may also receive an intravenous injection of x-ray contrast/dye. Plan on being at [facility] for 30 minutes or longer, depending on the type of exam you are having performed.  This test typically takes 30-45 minutes to complete.  If you have any questions regarding your exam or if you need to reschedule, you may call  the CT department at [phone] between the hours of 8:00 am and 5:00 pm, Monday-Friday.  ________________________________________________________________________  [name] have been scheduled for a colonoscopy. Please follow written instructions given to you at your visit today.  Please pick up your prep supplies at the pharmacy within the next 1-3 days. If you use inhalers (even only as needed), please bring them with you on the day of your procedure. Your physician has requested that you go to www.startemmi.com and enter the access code given to you at your visit today. This web site gives a general overview about your procedure. However, you should still follow specific instructions given to you by our office regarding your preparation for the procedure.

## 2017-03-25 NOTE — Progress Notes (Signed)
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 [name] for evaluation of chief complaints of intermittent rectal bleeding, progressive left lower quadrant pain, and constipation. Patient tells me that he had colonoscopy remotely in the [employer]. 2010 he underwent CT colonography (report reviewed) which was normal. She tells me that he has had chronic problems with constipation for which she has used laxatives. However the problem has worsened over the past 6 months. He did have total knee replacement in February. He also tells me that he has had intermittent rectal bleeding as manifested by bright red blood on the tissue, on the stool, and in the toilet bowl. Varying degrees of bleeding. Occasionally without bleeding. No associated rectal pain. His abdominal discomfort is left lower quadrant. Somewhat exacerbated by activity or positional change. There has been bloating. Improved with good defecation. Weight has fluctuated. No family history of colon cancer. Review of blood work from February 2018 reveals normal CBC with hemoglobin 14.5  REVIEW OF SYSTEMS:  All non-GI ROS negative except for arthritis, back pain, sleeping problems, urinary frequency  Past Medical History:  Diagnosis Date  . Arthritis   . BPH (benign prostatic hyperplasia)   . Bronchitis   . DDD (degenerative disc disease), cervical   . Diabetes mellitus without complication ([place])    type 2  . DJD (degenerative joint disease)   . Headache    migraines  . Hyperlipidemia   . Pneumonia   . Sinusitis     Past Surgical History:  Procedure Laterality Date  . Hip Resurface  2006  . MENISCUS REPAIR  2008, 1993  . SLAP Tear Repair  2008  . TONSILLECTOMY    . TOTAL KNEE ARTHROPLASTY Left 12/08/2016   Procedure: LEFT TOTAL KNEE ARTHROPLASTY;  Surgeon: [name],  MD;  Location: [facility];  Service: Orthopedics;  Laterality: Left;    Social History Daniel Bossier, 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 He has never used smokeless tobacco. He reports that he drinks alcohol. His drug history is not on file.  family history is not on file.  Allergies  Allergen Reactions  . Benzoin Compound     Blisters        PHYSICAL EXAMINATION: Vital signs: BP (!) 112/58   Pulse 70   Ht 6' (1.829 m)   Wt 283 lb (128.4 kg)   BMI 38.38 kg/m   Constitutional: Pleasant, without thyromegaly Lymph: generally well-appearing, no acute distress Psychiatric: alert and oriented x3, cooperative Eyes: extraocular movements intact, anicteric, conjunctiva pink Mouth: oral pharynx moist, no lesions Neck: supple no lymphadenopathy Cardiovascular: heart regular rate and rhythm, no murmur Lungs: clear to auscultation bilaterally Abdomen: soft, mild tenderness and slight fullness in left lower quadrant, no rebound, no obvious mass, nondistended, no obvious ascites, no peritoneal signs, normal bowel sounds, no organomegaly Rectal: Deferred until colonoscopy Extremities: no clubbing cyanosis or lower extremity edema bilaterally Skin: no lesions on visible extremities Neuro: No focal deficits.   ASSESSMENT:  #1. Intermittent rectal bleeding. Rule out benign anorectal pathology. Rule out neoplasia #2. Progressive somewhat persistent left lower quadrant pain. Etiology unclear. Rule out intrinsic colonic process, muscular process, intra-abdominal process #3. Chronic constipation. Somewhat worse #4. Gen. medical problems. Stable  PLAN:  #1. Schedule contrast-enhanced CT scan of the abdomen and pelvis to evaluate left lower quadrant abdominal pain #2.  Schedule optical colonoscopy to evaluate rectal bleeding and worsening constipation.The nature of the procedure, as well as the risks, benefits, and alternatives were carefully and thoroughly reviewed with the patient. Ample time  for discussion and questions allowed. The patient understood, was satisfied, and agreed to proceed. #3. Hold diabetic agent the day of the procedure to avoid and wanted hypoglycemia  A copy of this consultation note has been sent to Dr. Philip Aspen

## 2017-03-27 MED FILL — ZOLPIDEM TARTRATE 10 MG TAB: 10 | 90 days supply | Qty: 90 | Fill #0

## 2017-04-01 ENCOUNTER — Ambulatory Visit (INDEPENDENT_AMBULATORY_CARE_PROVIDER_SITE_OTHER)
Admission: RE | Admit: 2017-04-01 | Discharge: 2017-04-01 | Disposition: A | Discharge status: Home / Self Care | Payer: 59 | Source: Ambulatory Visit | Attending: Internal Medicine | Admitting: Internal Medicine

## 2017-04-01 DIAGNOSIS — M545 Low back pain: Secondary | ICD-10-CM | POA: Diagnosis not present

## 2017-04-01 DIAGNOSIS — M9903 Segmental and somatic dysfunction of lumbar region: Secondary | ICD-10-CM | POA: Diagnosis not present

## 2017-04-01 DIAGNOSIS — M546 Pain in thoracic spine: Secondary | ICD-10-CM | POA: Diagnosis not present

## 2017-04-01 DIAGNOSIS — R1032 Left lower quadrant pain: Secondary | ICD-10-CM | POA: Diagnosis not present

## 2017-04-01 DIAGNOSIS — M9904 Segmental and somatic dysfunction of sacral region: Secondary | ICD-10-CM | POA: Diagnosis not present

## 2017-04-01 MED ORDER — IOPAMIDOL (ISOVUE-300) INJECTION 61%
100.0000 mL | Freq: Once | INTRAVENOUS | Status: AC | PRN
  Administered 2017-04-01: 100 mL via INTRAVENOUS

## 2017-04-10 MED FILL — metFORMIN HCL 1000 MG TABS: 1000 | 22 days supply | Qty: 44 | Fill #2

## 2017-05-01 DIAGNOSIS — M546 Pain in thoracic spine: Secondary | ICD-10-CM | POA: Diagnosis not present

## 2017-05-01 DIAGNOSIS — M545 Low back pain: Secondary | ICD-10-CM | POA: Diagnosis not present

## 2017-05-01 DIAGNOSIS — M9903 Segmental and somatic dysfunction of lumbar region: Secondary | ICD-10-CM | POA: Diagnosis not present

## 2017-05-01 DIAGNOSIS — M9904 Segmental and somatic dysfunction of sacral region: Secondary | ICD-10-CM | POA: Diagnosis not present

## 2017-05-06 MED FILL — CYCLOBENZAPRINE 10 MG TAB: 10 | 90 days supply | Qty: 270 | Fill #0

## 2017-05-06 MED FILL — CIALIS 5 MG TABLET: 5 | 90 days supply | Qty: 90 | Fill #2

## 2017-05-06 MED FILL — LIDOCAINE 5% PATCH: 5 | 90 days supply | Qty: 180 | Fill #1

## 2017-05-06 MED FILL — TAMSULOSIN HCL 0.4 MG CAP: 0.4 | 90 days supply | Qty: 180 | Fill #0

## 2017-05-06 MED FILL — METHOCARBAMOL 500 MG TABLET: 500 | 90 days supply | Qty: 270 | Fill #0

## 2017-05-06 MED FILL — metFORMIN HCL 1000 MG TABS: 1000 | 90 days supply | Qty: 180 | Fill #3

## 2017-05-06 MED FILL — BUTALBITAL/APAP/CAFFEINE TB: 50-325-40 | 10 days supply | Qty: 60 | Fill #4

## 2017-05-15 MED FILL — RIZATRIPTAN 5 MG ODT: 5 | 25 days supply | Qty: 15 | Fill #0

## 2017-05-18 DIAGNOSIS — M546 Pain in thoracic spine: Secondary | ICD-10-CM | POA: Diagnosis not present

## 2017-05-18 DIAGNOSIS — M9904 Segmental and somatic dysfunction of sacral region: Secondary | ICD-10-CM | POA: Diagnosis not present

## 2017-05-18 DIAGNOSIS — M545 Low back pain: Secondary | ICD-10-CM | POA: Diagnosis not present

## 2017-05-18 DIAGNOSIS — M9903 Segmental and somatic dysfunction of lumbar region: Secondary | ICD-10-CM | POA: Diagnosis not present

## 2017-05-18 MED FILL — TOPIRAMATE 25 MG TABLET: 25 | 90 days supply | Qty: 90 | Fill #0

## 2017-05-19 MED FILL — AMOXICILLIN 500 MG CAPSULE: 500 | 1 days supply | Qty: 4 | Fill #0

## 2017-05-19 MED FILL — diazePAM 5 MG TABS: 5 | 30 days supply | Qty: 60 | Fill #3

## 2017-05-19 MED FILL — CELECOXIB 200 MG CAP: 200 | 90 days supply | Qty: 90 | Fill #0

## 2017-05-19 MED FILL — BACLOFEN 10 MG TABLET: 10 | 90 days supply | Qty: 360 | Fill #0

## 2017-05-20 MED FILL — traMADol HCL 50 MG TABS: 50 | 30 days supply | Qty: 240 | Fill #0

## 2017-05-26 ENCOUNTER — Ambulatory Visit (AMBULATORY_SURGERY_CENTER): Payer: 59 | Admitting: Internal Medicine

## 2017-05-26 ENCOUNTER — Encounter: Payer: Self-pay | Admitting: Internal Medicine

## 2017-05-26 VITALS — BP 94/64 | HR 75 | Temp 98.4°F | Resp 19 | Ht 72.0 in | Wt 283.0 lb

## 2017-05-26 DIAGNOSIS — R1032 Left lower quadrant pain: Secondary | ICD-10-CM

## 2017-05-26 DIAGNOSIS — K625 Hemorrhage of anus and rectum: Secondary | ICD-10-CM

## 2017-05-26 DIAGNOSIS — K5730 Diverticulosis of large intestine without perforation or abscess without bleeding: Secondary | ICD-10-CM | POA: Diagnosis not present

## 2017-05-26 DIAGNOSIS — K5900 Constipation, unspecified: Secondary | ICD-10-CM

## 2017-05-26 DIAGNOSIS — K648 Other hemorrhoids: Secondary | ICD-10-CM

## 2017-05-26 MED ORDER — SODIUM CHLORIDE 0.9 % IV SOLN: 500.0000 mL | INTRAVENOUS | Status: DC

## 2017-05-26 NOTE — Progress Notes (Signed)
Spontaneous respirations throughout. VSS. Resting comfortably. To PACU on room air. Report to  RN. 

## 2017-05-26 NOTE — Patient Instructions (Signed)
YOU HAD AN ENDOSCOPIC PROCEDURE TODAY AT [facility]:   Refer to the procedure report that was given to you for any specific questions about what was found during the examination.  If the procedure report does not answer your questions, please call your gastroenterologist to clarify.  If you requested that your care partner not be given the details of your procedure findings, then the procedure report has been included in a sealed envelope for you to review at your convenience later.  YOU SHOULD EXPECT: Some feelings of bloating in the abdomen. Passage of more gas than usual.  Walking can help get rid of the air that was put into your GI tract during the procedure and reduce the bloating. If you had a lower endoscopy (such as a colonoscopy or flexible sigmoidoscopy) you may notice spotting of blood in your stool or on the toilet paper. If you underwent a bowel prep for your procedure, you may not have a normal bowel movement for a few days.  Please Note:  You might notice some irritation and congestion in your nose or some drainage.  This is from the oxygen used during your procedure.  There is no need for concern and it should clear up in a day or so.  SYMPTOMS TO REPORT IMMEDIATELY:   Following lower endoscopy (colonoscopy or flexible sigmoidoscopy):  Excessive amounts of blood in the stool  Significant tenderness or worsening of abdominal pains  Swelling of the abdomen that is new, acute  Fever of 100F or higher    For urgent or emergent issues, a gastroenterologist can be reached at any hour by calling [phone].   DIET:  We do recommend a small meal at first, but then you may proceed to your regular diet.  Drink plenty of fluids but you should avoid alcoholic beverages for 24 hours.  ACTIVITY:  You should plan to take it easy for the rest of today and you should NOT DRIVE or use heavy machinery until tomorrow (because of the sedation medicines used during the test).     FOLLOW UP: Our staff will call the number listed on your records the next business day following your procedure to check on you and address any questions or concerns that you may have regarding the information given to you following your procedure. If we do not reach you, we will leave a message.  However, if you are feeling well and you are not experiencing any problems, there is no need to return our call.  We will assume that you have returned to your regular daily activities without incident.  If any biopsies were taken you will be contacted by phone or by letter within the next 1-3 weeks.  Please call us at [phone] if you have not heard about the biopsies in 3 weeks.    SIGNATURES/CONFIDENTIALITY: You and/or your care partner have signed paperwork which will be entered into your electronic medical record.  These signatures attest to the fact that that the information above on your After Visit Summary has been reviewed and is understood.  Full responsibility of the confidentiality of this discharge information lies with you and/or your care-partner.   Resume medications.Recommend 2 tablespoons of Metamucil daily.

## 2017-05-26 NOTE — Op Note (Signed)
[facility] Patient Name: [name] Procedure Date: 05/26/2017 4:10 PM MRN: [id number] Endoscopist: Docia Chuck. Henrene Pastor , MD Age: 56 Referring MD:  Date of Birth: [birth year] Gender: Male Account #: [id number] Procedure:                Colonoscopy Indications:              Abdominal pain in the left lower quadrant, Rectal                            bleeding, Constipation Medicines:                Monitored Anesthesia Care Procedure:                Pre-Anesthesia Assessment:                           - Prior to the procedure, a History and Physical                            was performed, and patient medications and                            allergies were reviewed. The patient's tolerance of                            previous anesthesia was also reviewed. The risks                            and benefits of the procedure and the sedation                            options and risks were discussed with the patient.                            All questions were answered, and informed consent                            was obtained. Prior Anticoagulants: The patient has                            taken no previous anticoagulant or antiplatelet                            agents. ASA Grade Assessment: II - A patient with                            mild systemic disease. After reviewing the risks                            and benefits, the patient was deemed in                            satisfactory condition to undergo the procedure.  After obtaining informed consent, the colonoscope                            was passed under direct vision. Throughout the                            procedure, the patient's blood pressure, pulse, and                            oxygen saturations were monitored continuously. The                            Colonoscope was introduced through the anus and                            advanced to the the cecum, identified by                            appendiceal orifice and ileocecal valve. The                            ileocecal valve, appendiceal orifice, and rectum                            were photographed. The quality of the bowel                            preparation was excellent. The colonoscopy was                            performed without difficulty. The patient tolerated                            the procedure well. The bowel preparation used was                            SUPREP. Scope In: 4:14:44 PM Scope Out: 4:27:39 PM Scope Withdrawal Time: 0 hours 9 minutes 35 seconds  Total Procedure Duration: 0 hours 12 minutes 55 seconds  Findings:                 Multiple small and large-mouthed diverticula were                            found in the entire colon.                           Internal hemorrhoids were found during                            retroflexion. The hemorrhoids were moderate.                           The exam was otherwise without abnormality on  direct and retroflexion views. Complications:            No immediate complications. Estimated blood loss:                            None. Estimated Blood Loss:     Estimated blood loss: none. Impression:               - Diverticulosis in the entire examined colon.                           - Internal hemorrhoids.                           - The examination was otherwise normal on direct                            and retroflexion views.                           - No specimens collected. Recommendation:           - Repeat colonoscopy in 10 years for screening                            purposes.                           - Patient has a contact number available for                            emergencies. The signs and symptoms of potential                            delayed complications were discussed with the                            patient. Return to normal activities tomorrow.                             Written discharge instructions were provided to the                            patient.                           - Resume previous diet.                           - Continue present medications.                           - Recommend Metamucil 2 tablespoons daily and                            plenty of water. This will help constipation,  abdominal discomfort, diverticular changes, and                            hemorrhoids. Docia Chuck. Henrene Pastor, MD 05/26/2017 4:35:43 PM This report has been signed electronically.

## 2017-05-27 ENCOUNTER — Telehealth: Payer: Self-pay | Admitting: *Deleted

## 2017-05-27 NOTE — Telephone Encounter (Signed)
Message left

## 2017-06-01 DIAGNOSIS — M9903 Segmental and somatic dysfunction of lumbar region: Secondary | ICD-10-CM | POA: Diagnosis not present

## 2017-06-01 DIAGNOSIS — M545 Low back pain: Secondary | ICD-10-CM | POA: Diagnosis not present

## 2017-06-01 DIAGNOSIS — M546 Pain in thoracic spine: Secondary | ICD-10-CM | POA: Diagnosis not present

## 2017-06-01 DIAGNOSIS — M9904 Segmental and somatic dysfunction of sacral region: Secondary | ICD-10-CM | POA: Diagnosis not present

## 2017-06-09 MED FILL — HYDROCODON-APAP 5-325: 5-325 | 3 days supply | Qty: 20 | Fill #0

## 2017-06-09 MED FILL — PRAVASTATIN NA 40 MG TAB: 40 | 82 days supply | Qty: 82 | Fill #2

## 2017-06-09 MED FILL — PENICILLIN VK 500 MG TABLET: 500 | 7 days supply | Qty: 28 | Fill #0

## 2017-06-29 ENCOUNTER — Ambulatory Visit (HOSPITAL_BASED_OUTPATIENT_CLINIC_OR_DEPARTMENT_OTHER): Payer: 59 | Admitting: Physical Medicine & Rehabilitation

## 2017-06-29 ENCOUNTER — Encounter: Payer: 59 | Attending: Physical Medicine & Rehabilitation

## 2017-06-29 ENCOUNTER — Encounter: Payer: Self-pay | Admitting: Physical Medicine & Rehabilitation

## 2017-06-29 VITALS — BP 140/82 | HR 86

## 2017-06-29 DIAGNOSIS — M9904 Segmental and somatic dysfunction of sacral region: Secondary | ICD-10-CM | POA: Diagnosis not present

## 2017-06-29 DIAGNOSIS — M9903 Segmental and somatic dysfunction of lumbar region: Secondary | ICD-10-CM | POA: Diagnosis not present

## 2017-06-29 DIAGNOSIS — G43809 Other migraine, not intractable, without status migrainosus: Secondary | ICD-10-CM | POA: Insufficient documentation

## 2017-06-29 DIAGNOSIS — M546 Pain in thoracic spine: Secondary | ICD-10-CM | POA: Diagnosis not present

## 2017-06-29 DIAGNOSIS — G43719 Chronic migraine without aura, intractable, without status migrainosus: Secondary | ICD-10-CM | POA: Diagnosis not present

## 2017-06-29 DIAGNOSIS — M545 Low back pain: Secondary | ICD-10-CM | POA: Diagnosis not present

## 2017-06-29 NOTE — Progress Notes (Signed)
Botox injection for chronic migraine prophylaxis.  Indication: History of migraines with greater than 15 headaches per month despite trials of oral medications.  Informed consent was obtained after discussing risks and benefits of the procedure with the patient this included bleeding bruising infection as well as facial drooping, eyelid lag, systemic effects of Botox. Patient elects to proceed and has given written consent.  Patient placed in a seated position Dilution 50 units per cc  Muscles injected with dose  Procerus 5 units Corrugator 5 units bilateral Frontalis 5 units 2 injection sites on the right and 2 injection sites on the left Temporalis 5 units in 4 injection sites on the right and 4 injection sites on the left Occipitalis 5 units into 3 injections sites on the right and 3 injection sites on the left  Cervical paraspinals 5 units into 2 injection sites on the right and 2 injection sites on the left trapezius 5 units into 3 injection sites on the right and 3 injection sites on the left Right levator scapula, 15 units, Right Glut med Patient tolerated procedure well. Post procedure instructions given. Appointment for repeat injection in 3 months

## 2017-06-29 NOTE — Patient Instructions (Signed)

## 2017-07-08 DIAGNOSIS — M546 Pain in thoracic spine: Secondary | ICD-10-CM | POA: Diagnosis not present

## 2017-07-08 DIAGNOSIS — M9903 Segmental and somatic dysfunction of lumbar region: Secondary | ICD-10-CM | POA: Diagnosis not present

## 2017-07-08 DIAGNOSIS — M545 Low back pain: Secondary | ICD-10-CM | POA: Diagnosis not present

## 2017-07-08 DIAGNOSIS — M9904 Segmental and somatic dysfunction of sacral region: Secondary | ICD-10-CM | POA: Diagnosis not present

## 2017-07-13 DIAGNOSIS — Z125 Encounter for screening for malignant neoplasm of prostate: Secondary | ICD-10-CM | POA: Diagnosis not present

## 2017-07-13 DIAGNOSIS — E1151 Type 2 diabetes mellitus with diabetic peripheral angiopathy without gangrene: Secondary | ICD-10-CM | POA: Diagnosis not present

## 2017-07-13 DIAGNOSIS — I10 Essential (primary) hypertension: Secondary | ICD-10-CM | POA: Diagnosis not present

## 2017-07-13 DIAGNOSIS — Z0000 Encounter for general adult medical examination without abnormal findings: Secondary | ICD-10-CM | POA: Diagnosis not present

## 2017-07-20 DIAGNOSIS — M542 Cervicalgia: Secondary | ICD-10-CM | POA: Diagnosis not present

## 2017-07-20 DIAGNOSIS — Z6841 Body Mass Index (BMI) 40.0 and over, adult: Secondary | ICD-10-CM | POA: Diagnosis not present

## 2017-07-20 DIAGNOSIS — I10 Essential (primary) hypertension: Secondary | ICD-10-CM | POA: Diagnosis not present

## 2017-07-20 DIAGNOSIS — Z0000 Encounter for general adult medical examination without abnormal findings: Secondary | ICD-10-CM | POA: Diagnosis not present

## 2017-07-20 DIAGNOSIS — M25562 Pain in left knee: Secondary | ICD-10-CM | POA: Diagnosis not present

## 2017-07-20 DIAGNOSIS — E1151 Type 2 diabetes mellitus with diabetic peripheral angiopathy without gangrene: Secondary | ICD-10-CM | POA: Diagnosis not present

## 2017-07-20 DIAGNOSIS — M545 Low back pain: Secondary | ICD-10-CM | POA: Diagnosis not present

## 2017-07-20 DIAGNOSIS — E7849 Other hyperlipidemia: Secondary | ICD-10-CM | POA: Diagnosis not present

## 2017-07-20 DIAGNOSIS — Z1389 Encounter for screening for other disorder: Secondary | ICD-10-CM | POA: Diagnosis not present

## 2017-07-20 MED FILL — RIZATRIPTAN 5 MG ODT: 5 | 25 days supply | Qty: 15 | Fill #1

## 2017-07-20 MED FILL — ZOLPIDEM TARTRATE 10 MG TAB: 10 | 90 days supply | Qty: 90 | Fill #1

## 2017-07-20 MED FILL — oxyCODONE HCL 5 MG TABS: 5 | 30 days supply | Qty: 180 | Fill #0

## 2017-07-21 MED FILL — diazePAM 5 MG TABS: 5 | 30 days supply | Qty: 60 | Fill #0

## 2017-07-21 MED FILL — BUTALB-ACETAMIN-CAFF 50-325: 50-325-40 | 10 days supply | Qty: 60 | Fill #0

## 2017-07-24 ENCOUNTER — Encounter: Payer: Self-pay | Admitting: Physical Medicine & Rehabilitation

## 2017-07-24 ENCOUNTER — Ambulatory Visit (HOSPITAL_BASED_OUTPATIENT_CLINIC_OR_DEPARTMENT_OTHER): Payer: 59 | Admitting: Physical Medicine & Rehabilitation

## 2017-07-24 ENCOUNTER — Encounter: Payer: 59 | Attending: Physical Medicine & Rehabilitation

## 2017-07-24 VITALS — BP 118/76 | HR 104

## 2017-07-24 DIAGNOSIS — G43809 Other migraine, not intractable, without status migrainosus: Secondary | ICD-10-CM | POA: Diagnosis not present

## 2017-07-24 DIAGNOSIS — M7918 Myalgia, other site: Secondary | ICD-10-CM | POA: Diagnosis not present

## 2017-07-24 NOTE — Patient Instructions (Signed)
Trigger point injection performed today  May use ice if there is any soreness or swelling, 20 minutes every couple hours.

## 2017-07-24 NOTE — Progress Notes (Signed)
Trigger Point Injection  Indication: Right cervical Myofascial pain not relieved by medication management and other conservative care.  Informed consent was obtained after describing risk and benefits of the procedure with the patient, this includes bleeding, bruising, infection and medication side effects.  The patient wishes to proceed and has given written consent.  The patient was placed in a Seated position.  The Right trap and levator scap area was marked and prepped with Betadine.  It was entered with a 25-gauge 1-1/2 inch needle and 1 mL of 1% lidocaine was injected into each of 4 trap and 2 levator scap trigger points, after negative draw back for blood.  The patient tolerated the procedure well.  Post procedure instructions were given.

## 2017-08-13 MED FILL — LIDOCAINE 5% PATCH: 5 | 90 days supply | Qty: 180 | Fill #2

## 2017-08-13 MED FILL — METHOCARBAMOL 500 MG TABS: 500 | 90 days supply | Qty: 270 | Fill #1

## 2017-08-13 MED FILL — TADALAFIL 5 MG TABS: 5 | 90 days supply | Qty: 90 | Fill #3

## 2017-08-13 MED FILL — BACLOFEN 10 MG TABLET: 10 | 90 days supply | Qty: 360 | Fill #1

## 2017-08-13 MED FILL — metFORMIN HCL 1000 MG TABS: 1000 | 90 days supply | Qty: 180 | Fill #0

## 2017-08-13 MED FILL — CYCLOBENZAPRINE 10 MG TAB: 10 | 90 days supply | Qty: 270 | Fill #1

## 2017-08-13 MED FILL — BUTALB-ACETAMIN-CAFF 50-325: 50-325-40 | 10 days supply | Qty: 60 | Fill #1

## 2017-08-13 MED FILL — TAMSULOSIN HCL 0.4 MG CAP: 0.4 | 30 days supply | Qty: 60 | Fill #1

## 2017-08-13 MED FILL — CELECOXIB 200 MG CAPS: 200 | 90 days supply | Qty: 90 | Fill #1

## 2017-08-13 MED FILL — TOPIRAMATE 25 MG TAB: 25 | 90 days supply | Qty: 90 | Fill #1

## 2017-08-13 MED FILL — RIZATRIPTAN 5 MG ODT: 5 | 25 days supply | Qty: 15 | Fill #2

## 2017-08-21 DIAGNOSIS — M1712 Unilateral primary osteoarthritis, left knee: Secondary | ICD-10-CM | POA: Diagnosis not present

## 2017-08-21 MED FILL — PRAVASTATIN NA 40 MG TAB: 40 | 90 days supply | Qty: 90 | Fill #3

## 2017-08-21 MED FILL — traMADol HCL 50 MG TABS: 50 | 30 days supply | Qty: 240 | Fill #1

## 2017-08-21 MED FILL — diazePAM 5 MG TABS: 5 | 30 days supply | Qty: 60 | Fill #1

## 2017-09-09 ENCOUNTER — Other Ambulatory Visit (HOSPITAL_COMMUNITY)
Admit: 2017-09-09 | Discharge: 2017-09-09 | Disposition: A | Discharge status: Home / Self Care | Payer: 59 | Source: Ambulatory Visit | Attending: Internal Medicine | Admitting: Internal Medicine

## 2017-09-09 DIAGNOSIS — R7611 Nonspecific reaction to tuberculin skin test without active tuberculosis: Secondary | ICD-10-CM | POA: Diagnosis not present

## 2017-09-10 MED FILL — AMOXICILLIN 500 MG CAPSULE: 500 | 3 days supply | Qty: 12 | Fill #0

## 2017-09-13 LAB — QUANTIFERON-TB GOLD PLUS (RQFGPL)
QuantiFERON Mitogen Value: >10 IU/mL
QuantiFERON Nil Value: 0.04 IU/mL
QuantiFERON TB1 Ag Value: 1.46 IU/mL
QuantiFERON TB2 Ag Value: 0.96 IU/mL

## 2017-09-13 LAB — QUANTIFERON-TB GOLD PLUS: QuantiFERON-TB Gold Plus: POSITIVE — AB

## 2017-09-16 MED FILL — HYDROCODON-APAP 5-325: 5-325 | 2 days supply | Qty: 8 | Fill #0

## 2017-09-30 ENCOUNTER — Ambulatory Visit (HOSPITAL_COMMUNITY)
Admission: RE | Admit: 2017-09-30 | Discharge: 2017-09-30 | Disposition: A | Discharge status: Home / Self Care | Payer: 59 | Source: Ambulatory Visit | Attending: Occupational Medicine | Admitting: Occupational Medicine

## 2017-09-30 ENCOUNTER — Other Ambulatory Visit: Payer: Self-pay | Admitting: Occupational Medicine

## 2017-09-30 DIAGNOSIS — R7611 Nonspecific reaction to tuberculin skin test without active tuberculosis: Secondary | ICD-10-CM

## 2017-09-30 DIAGNOSIS — R7612 Nonspecific reaction to cell mediated immunity measurement of gamma interferon antigen response without active tuberculosis: Secondary | ICD-10-CM | POA: Insufficient documentation

## 2017-10-02 MED FILL — METHYLPREDNISOLONE 4 MG TAB: 4 | 6 days supply | Qty: 21 | Fill #0

## 2017-10-02 MED FILL — diazePAM 5 MG TABS: 5 | 30 days supply | Qty: 60 | Fill #2

## 2017-10-05 ENCOUNTER — Other Ambulatory Visit (HOSPITAL_COMMUNITY): Payer: Self-pay | Admitting: Neurosurgery

## 2017-10-05 DIAGNOSIS — M5412 Radiculopathy, cervical region: Secondary | ICD-10-CM

## 2017-10-05 DIAGNOSIS — M545 Low back pain: Secondary | ICD-10-CM

## 2017-10-10 ENCOUNTER — Ambulatory Visit (HOSPITAL_COMMUNITY)
Admission: RE | Admit: 2017-10-10 | Discharge: 2017-10-10 | Disposition: A | Discharge status: Home / Self Care | Payer: 59 | Source: Ambulatory Visit | Attending: Neurosurgery | Admitting: Neurosurgery

## 2017-10-10 DIAGNOSIS — M5127 Other intervertebral disc displacement, lumbosacral region: Secondary | ICD-10-CM | POA: Insufficient documentation

## 2017-10-10 DIAGNOSIS — M4722 Other spondylosis with radiculopathy, cervical region: Secondary | ICD-10-CM | POA: Insufficient documentation

## 2017-10-10 DIAGNOSIS — M4802 Spinal stenosis, cervical region: Secondary | ICD-10-CM | POA: Insufficient documentation

## 2017-10-10 DIAGNOSIS — M5412 Radiculopathy, cervical region: Secondary | ICD-10-CM | POA: Diagnosis present

## 2017-10-10 DIAGNOSIS — M47812 Spondylosis without myelopathy or radiculopathy, cervical region: Secondary | ICD-10-CM | POA: Diagnosis not present

## 2017-10-10 DIAGNOSIS — M545 Low back pain: Secondary | ICD-10-CM

## 2017-10-10 DIAGNOSIS — M50123 Cervical disc disorder at C6-C7 level with radiculopathy: Secondary | ICD-10-CM | POA: Diagnosis not present

## 2017-10-19 DIAGNOSIS — M546 Pain in thoracic spine: Secondary | ICD-10-CM | POA: Diagnosis not present

## 2017-10-19 DIAGNOSIS — M9903 Segmental and somatic dysfunction of lumbar region: Secondary | ICD-10-CM | POA: Diagnosis not present

## 2017-10-19 DIAGNOSIS — M542 Cervicalgia: Secondary | ICD-10-CM | POA: Diagnosis not present

## 2017-10-19 DIAGNOSIS — M545 Low back pain: Secondary | ICD-10-CM | POA: Diagnosis not present

## 2017-10-20 DIAGNOSIS — M79652 Pain in left thigh: Secondary | ICD-10-CM | POA: Diagnosis not present

## 2017-10-20 DIAGNOSIS — Z6841 Body Mass Index (BMI) 40.0 and over, adult: Secondary | ICD-10-CM | POA: Diagnosis not present

## 2017-10-20 DIAGNOSIS — Z1389 Encounter for screening for other disorder: Secondary | ICD-10-CM | POA: Diagnosis not present

## 2017-10-20 MED FILL — ZOLPIDEM TARTRATE 10 MG TAB: 10 | 90 days supply | Qty: 90 | Fill #0

## 2017-10-20 MED FILL — oxyCODONE HCL 5 MG TABS: 5 | 30 days supply | Qty: 180 | Fill #0

## 2017-10-20 MED FILL — traMADol HCL 50 MG TABS: 50 | 30 days supply | Qty: 240 | Fill #2

## 2017-10-20 MED FILL — RIZATRIPTAN 5 MG ODT: 5 | 30 days supply | Qty: 12 | Fill #3

## 2017-10-20 MED FILL — BUTALB-ACETAMIN-CAFF 50-325: 50-325-40 | 10 days supply | Qty: 60 | Fill #2

## 2017-10-21 ENCOUNTER — Other Ambulatory Visit: Payer: Self-pay | Admitting: Internal Medicine

## 2017-10-21 ENCOUNTER — Other Ambulatory Visit (HOSPITAL_COMMUNITY): Payer: Self-pay | Admitting: Internal Medicine

## 2017-10-21 DIAGNOSIS — M79651 Pain in right thigh: Secondary | ICD-10-CM

## 2017-10-21 MED FILL — TAMSULOSIN HCL 0.4 MG CAP: 0.4 | 30 days supply | Qty: 60 | Fill #0

## 2017-11-02 ENCOUNTER — Other Ambulatory Visit (HOSPITAL_COMMUNITY): Payer: Self-pay | Admitting: Orthopedic Surgery

## 2017-11-02 DIAGNOSIS — M9903 Segmental and somatic dysfunction of lumbar region: Secondary | ICD-10-CM | POA: Diagnosis not present

## 2017-11-02 DIAGNOSIS — S4381XS Sprain of other specified parts of right shoulder girdle, sequela: Secondary | ICD-10-CM

## 2017-11-02 DIAGNOSIS — M542 Cervicalgia: Secondary | ICD-10-CM | POA: Diagnosis not present

## 2017-11-02 DIAGNOSIS — M9904 Segmental and somatic dysfunction of sacral region: Secondary | ICD-10-CM | POA: Diagnosis not present

## 2017-11-02 DIAGNOSIS — M25551 Pain in right hip: Secondary | ICD-10-CM | POA: Diagnosis not present

## 2017-11-04 ENCOUNTER — Ambulatory Visit (HOSPITAL_COMMUNITY)
Admission: RE | Admit: 2017-11-04 | Discharge: 2017-11-04 | Disposition: A | Discharge status: Home / Self Care | Payer: 59 | Source: Ambulatory Visit | Attending: Orthopedic Surgery | Admitting: Orthopedic Surgery

## 2017-11-04 DIAGNOSIS — M12811 Other specific arthropathies, not elsewhere classified, right shoulder: Secondary | ICD-10-CM | POA: Diagnosis not present

## 2017-11-04 DIAGNOSIS — X58XXXA Exposure to other specified factors, initial encounter: Secondary | ICD-10-CM | POA: Diagnosis not present

## 2017-11-04 DIAGNOSIS — S4381XS Sprain of other specified parts of right shoulder girdle, sequela: Secondary | ICD-10-CM | POA: Insufficient documentation

## 2017-11-04 DIAGNOSIS — M25511 Pain in right shoulder: Secondary | ICD-10-CM | POA: Diagnosis not present

## 2017-11-10 ENCOUNTER — Ambulatory Visit
Admission: RE | Admit: 2017-11-10 | Discharge: 2017-11-10 | Disposition: A | Discharge status: Home / Self Care | Payer: 59 | Source: Ambulatory Visit | Attending: Internal Medicine | Admitting: Internal Medicine

## 2017-11-10 DIAGNOSIS — R2241 Localized swelling, mass and lump, right lower limb: Secondary | ICD-10-CM | POA: Diagnosis not present

## 2017-11-10 DIAGNOSIS — M79651 Pain in right thigh: Secondary | ICD-10-CM

## 2017-11-10 DIAGNOSIS — M545 Low back pain: Secondary | ICD-10-CM | POA: Diagnosis not present

## 2017-11-10 DIAGNOSIS — M542 Cervicalgia: Secondary | ICD-10-CM | POA: Diagnosis not present

## 2017-11-10 MED ORDER — GADOBENATE DIMEGLUMINE 529 MG/ML IV SOLN
20.0000 mL | Freq: Once | INTRAVENOUS | Status: AC | PRN
  Administered 2017-11-10: 20 mL via INTRAVENOUS

## 2017-11-17 DIAGNOSIS — D1779 Benign lipomatous neoplasm of other sites: Secondary | ICD-10-CM | POA: Diagnosis not present

## 2017-11-17 MED FILL — diazePAM 5 MG TABS: 5 | 30 days supply | Qty: 60 | Fill #3

## 2017-11-24 DIAGNOSIS — M545 Low back pain: Secondary | ICD-10-CM | POA: Diagnosis not present

## 2017-11-24 DIAGNOSIS — M542 Cervicalgia: Secondary | ICD-10-CM | POA: Diagnosis not present

## 2017-11-27 DIAGNOSIS — M25511 Pain in right shoulder: Secondary | ICD-10-CM | POA: Diagnosis not present

## 2017-11-30 DIAGNOSIS — M25511 Pain in right shoulder: Secondary | ICD-10-CM | POA: Diagnosis not present

## 2017-12-01 DIAGNOSIS — M545 Low back pain: Secondary | ICD-10-CM | POA: Diagnosis not present

## 2017-12-01 DIAGNOSIS — M9903 Segmental and somatic dysfunction of lumbar region: Secondary | ICD-10-CM | POA: Diagnosis not present

## 2017-12-01 DIAGNOSIS — M542 Cervicalgia: Secondary | ICD-10-CM | POA: Diagnosis not present

## 2017-12-01 DIAGNOSIS — M546 Pain in thoracic spine: Secondary | ICD-10-CM | POA: Diagnosis not present

## 2017-12-04 MED FILL — CYCLOBENZAPRINE 10 MG TAB: 10 | 90 days supply | Qty: 270 | Fill #2

## 2017-12-04 MED FILL — TAMSULOSIN HCL 0.4 MG CAP: 0.4 | 90 days supply | Qty: 180 | Fill #1

## 2017-12-04 MED FILL — TADALAFIL 5 MG TABS: 5 | 90 days supply | Qty: 90 | Fill #0

## 2017-12-04 MED FILL — CELECOXIB 200 MG CAP: 200 | 90 days supply | Qty: 90 | Fill #2

## 2017-12-04 MED FILL — RIZATRIPTAN 5 MG ODT: 5 | 30 days supply | Qty: 12 | Fill #4

## 2017-12-04 MED FILL — metFORMIN HCL 1000 MG TABS: 1000 | 90 days supply | Qty: 180 | Fill #1

## 2017-12-04 MED FILL — BUTALB-ACETAMIN-CAFF 50-325: 50-325-40 | 10 days supply | Qty: 60 | Fill #3

## 2017-12-18 DIAGNOSIS — Z96652 Presence of left artificial knee joint: Secondary | ICD-10-CM | POA: Diagnosis not present

## 2017-12-18 DIAGNOSIS — M25562 Pain in left knee: Secondary | ICD-10-CM | POA: Diagnosis not present

## 2017-12-18 DIAGNOSIS — M545 Low back pain: Secondary | ICD-10-CM | POA: Diagnosis not present

## 2017-12-18 DIAGNOSIS — M542 Cervicalgia: Secondary | ICD-10-CM | POA: Diagnosis not present

## 2017-12-18 DIAGNOSIS — M1712 Unilateral primary osteoarthritis, left knee: Secondary | ICD-10-CM | POA: Diagnosis not present

## 2017-12-21 ENCOUNTER — Other Ambulatory Visit (HOSPITAL_COMMUNITY): Payer: Self-pay | Admitting: Orthopedic Surgery

## 2017-12-21 DIAGNOSIS — M25562 Pain in left knee: Secondary | ICD-10-CM

## 2017-12-21 DIAGNOSIS — M9903 Segmental and somatic dysfunction of lumbar region: Secondary | ICD-10-CM | POA: Diagnosis not present

## 2017-12-21 DIAGNOSIS — M546 Pain in thoracic spine: Secondary | ICD-10-CM | POA: Diagnosis not present

## 2017-12-21 DIAGNOSIS — M545 Low back pain: Secondary | ICD-10-CM | POA: Diagnosis not present

## 2017-12-21 DIAGNOSIS — M542 Cervicalgia: Secondary | ICD-10-CM | POA: Diagnosis not present

## 2017-12-22 DIAGNOSIS — M5412 Radiculopathy, cervical region: Secondary | ICD-10-CM | POA: Diagnosis not present

## 2017-12-29 ENCOUNTER — Encounter (HOSPITAL_COMMUNITY): Payer: 59

## 2018-01-04 DIAGNOSIS — M542 Cervicalgia: Secondary | ICD-10-CM | POA: Diagnosis not present

## 2018-01-04 DIAGNOSIS — M9903 Segmental and somatic dysfunction of lumbar region: Secondary | ICD-10-CM | POA: Diagnosis not present

## 2018-01-04 DIAGNOSIS — M545 Low back pain: Secondary | ICD-10-CM | POA: Diagnosis not present

## 2018-01-04 DIAGNOSIS — M546 Pain in thoracic spine: Secondary | ICD-10-CM | POA: Diagnosis not present

## 2018-01-11 DIAGNOSIS — M1812 Unilateral primary osteoarthritis of first carpometacarpal joint, left hand: Secondary | ICD-10-CM | POA: Diagnosis not present

## 2018-01-11 DIAGNOSIS — M189 Osteoarthritis of first carpometacarpal joint, unspecified: Secondary | ICD-10-CM | POA: Diagnosis not present

## 2018-01-11 DIAGNOSIS — M7711 Lateral epicondylitis, right elbow: Secondary | ICD-10-CM | POA: Diagnosis not present

## 2018-01-19 DIAGNOSIS — M9903 Segmental and somatic dysfunction of lumbar region: Secondary | ICD-10-CM | POA: Diagnosis not present

## 2018-01-19 DIAGNOSIS — M546 Pain in thoracic spine: Secondary | ICD-10-CM | POA: Diagnosis not present

## 2018-01-19 DIAGNOSIS — M542 Cervicalgia: Secondary | ICD-10-CM | POA: Diagnosis not present

## 2018-01-19 DIAGNOSIS — M545 Low back pain: Secondary | ICD-10-CM | POA: Diagnosis not present

## 2018-01-21 DIAGNOSIS — M545 Low back pain: Secondary | ICD-10-CM | POA: Diagnosis not present

## 2018-01-25 ENCOUNTER — Encounter (HOSPITAL_COMMUNITY)
Admission: RE | Admit: 2018-01-25 | Discharge: 2018-01-25 | Disposition: A | Discharge status: Home / Self Care | Payer: 59 | Source: Ambulatory Visit | Attending: Orthopedic Surgery | Admitting: Orthopedic Surgery

## 2018-01-25 DIAGNOSIS — M1711 Unilateral primary osteoarthritis, right knee: Secondary | ICD-10-CM | POA: Diagnosis not present

## 2018-01-25 DIAGNOSIS — M25562 Pain in left knee: Secondary | ICD-10-CM | POA: Diagnosis not present

## 2018-01-25 MED ORDER — TECHNETIUM TC 99M MEDRONATE IV KIT
20.2000 | PACK | Freq: Once | INTRAVENOUS | Status: AC | PRN
  Administered 2018-01-25: 20.2 via INTRAVENOUS

## 2018-01-25 MED FILL — TOPIRAMATE 25 MG TAB: 25 | 90 days supply | Qty: 90 | Fill #0

## 2018-01-25 MED FILL — PRAVASTATIN NA 40 MG TAB: 40 | 90 days supply | Qty: 90 | Fill #0

## 2018-01-25 MED FILL — ZOLPIDEM TARTRATE 10 MG TAB: 10 | 90 days supply | Qty: 90 | Fill #1

## 2018-01-26 ENCOUNTER — Telehealth: Payer: Self-pay | Admitting: Physical Medicine & Rehabilitation

## 2018-01-26 MED FILL — diazePAM 5 MG TABS: 5 | 30 days supply | Qty: 60 | Fill #0

## 2018-01-26 NOTE — Telephone Encounter (Signed)
Yes of course

## 2018-01-26 NOTE — Telephone Encounter (Signed)
Pt phoned requesting Botox for Migraines. Pt was last seen for Botox on 06/2017. Please advise if okay to schedule repeat Botox.

## 2018-01-27 MED FILL — BUTALB-ACETAMIN-CAFF 50-325: 50-325-40 | 10 days supply | Qty: 60 | Fill #4

## 2018-01-27 MED FILL — RIZATRIPTAN 5 MG ODT: 5 | 30 days supply | Qty: 12 | Fill #5

## 2018-01-28 ENCOUNTER — Telehealth: Payer: Self-pay | Admitting: Physical Medicine & Rehabilitation

## 2018-01-28 DIAGNOSIS — M545 Low back pain: Secondary | ICD-10-CM | POA: Diagnosis not present

## 2018-02-01 DIAGNOSIS — M545 Low back pain: Secondary | ICD-10-CM | POA: Diagnosis not present

## 2018-02-01 DIAGNOSIS — M542 Cervicalgia: Secondary | ICD-10-CM | POA: Diagnosis not present

## 2018-02-02 DIAGNOSIS — M545 Low back pain: Secondary | ICD-10-CM | POA: Diagnosis not present

## 2018-02-04 DIAGNOSIS — M545 Low back pain: Secondary | ICD-10-CM | POA: Diagnosis not present

## 2018-02-08 DIAGNOSIS — M545 Low back pain: Secondary | ICD-10-CM | POA: Diagnosis not present

## 2018-02-11 DIAGNOSIS — M25562 Pain in left knee: Secondary | ICD-10-CM | POA: Diagnosis not present

## 2018-02-16 ENCOUNTER — Ambulatory Visit (HOSPITAL_BASED_OUTPATIENT_CLINIC_OR_DEPARTMENT_OTHER): Payer: 59 | Admitting: Physical Medicine & Rehabilitation

## 2018-02-16 ENCOUNTER — Encounter: Payer: 59 | Attending: Physical Medicine & Rehabilitation

## 2018-02-16 ENCOUNTER — Encounter: Payer: Self-pay | Admitting: Physical Medicine & Rehabilitation

## 2018-02-16 VITALS — BP 120/84 | HR 100 | Ht 72.0 in | Wt 291.8 lb

## 2018-02-16 DIAGNOSIS — M546 Pain in thoracic spine: Secondary | ICD-10-CM | POA: Diagnosis not present

## 2018-02-16 DIAGNOSIS — G43719 Chronic migraine without aura, intractable, without status migrainosus: Secondary | ICD-10-CM

## 2018-02-16 DIAGNOSIS — M542 Cervicalgia: Secondary | ICD-10-CM | POA: Diagnosis not present

## 2018-02-16 DIAGNOSIS — M545 Low back pain: Secondary | ICD-10-CM | POA: Diagnosis not present

## 2018-02-16 DIAGNOSIS — M9903 Segmental and somatic dysfunction of lumbar region: Secondary | ICD-10-CM | POA: Diagnosis not present

## 2018-02-16 NOTE — Procedures (Signed)
Botox injection for chronic migraine prophylaxis.  Indication: History of migraines with greater than 15 headaches per month despite trials of oral medications.  Informed consent was obtained after discussing risks and benefits of the procedure with the patient this included bleeding bruising infection as well as facial drooping, eyelid lag, systemic effects of Botox. Patient elects to proceed and has given written consent.  Patient placed in a seated position Dilution 50 units per cc  Muscles injected with dose  Procerus 5 units Corrugator 5 units bilateral Frontalis 5 units 2 injection sites on the right and 2 injection sites on the left Temporalis 5 units in 4 injection sites on the right and 4 injection sites on the left Occipitalis 5 units into 3 injections sites on the right and 3 injection sites on the left  Cervical paraspinals 5 units into 2 injection sites on the right and 2 injection sites on the left trapezius 5 units into 3 injection sites on the right and 3 injection sites on the left bilateral levator scapula, 5 units bilaterally Patient tolerated procedure well. Post procedure instructions given. Appointment for repeat injection , pt will call as he gets >6 mo relief from treatment

## 2018-02-16 NOTE — Progress Notes (Signed)
See procedure note.

## 2018-02-16 NOTE — Patient Instructions (Signed)

## 2018-02-17 MED FILL — AMOXICILLIN 500 MG CAPSULE: 500 | 5 days supply | Qty: 20 | Fill #0

## 2018-02-18 DIAGNOSIS — M545 Low back pain: Secondary | ICD-10-CM | POA: Diagnosis not present

## 2018-02-22 DIAGNOSIS — M47812 Spondylosis without myelopathy or radiculopathy, cervical region: Secondary | ICD-10-CM | POA: Diagnosis not present

## 2018-03-09 DIAGNOSIS — M9903 Segmental and somatic dysfunction of lumbar region: Secondary | ICD-10-CM | POA: Diagnosis not present

## 2018-03-09 DIAGNOSIS — M546 Pain in thoracic spine: Secondary | ICD-10-CM | POA: Diagnosis not present

## 2018-03-09 DIAGNOSIS — M545 Low back pain: Secondary | ICD-10-CM | POA: Diagnosis not present

## 2018-03-09 DIAGNOSIS — M542 Cervicalgia: Secondary | ICD-10-CM | POA: Diagnosis not present

## 2018-03-12 ENCOUNTER — Other Ambulatory Visit: Payer: Self-pay | Admitting: Oncology

## 2018-03-15 DIAGNOSIS — M47812 Spondylosis without myelopathy or radiculopathy, cervical region: Secondary | ICD-10-CM | POA: Diagnosis not present

## 2018-03-19 DIAGNOSIS — M25561 Pain in right knee: Secondary | ICD-10-CM | POA: Diagnosis not present

## 2018-03-19 DIAGNOSIS — R2241 Localized swelling, mass and lump, right lower limb: Secondary | ICD-10-CM | POA: Diagnosis not present

## 2018-03-25 MED FILL — METHOCARBAMOL 500 MG TABS: 500 | 90 days supply | Qty: 270 | Fill #0

## 2018-03-25 MED FILL — CYCLOBENZAPRINE 10 MG TAB: 10 | 90 days supply | Qty: 270 | Fill #3

## 2018-03-25 MED FILL — CELECOXIB 200 MG CAP: 200 | 90 days supply | Qty: 90 | Fill #0

## 2018-03-25 MED FILL — metFORMIN HCL 1000 MG TABS: 1000 | 90 days supply | Qty: 180 | Fill #2

## 2018-03-25 MED FILL — oxyCODONE HCL 5 MG TABS: 5 | 30 days supply | Qty: 180 | Fill #0

## 2018-03-25 MED FILL — diazePAM 5 MG TABS: 5 | 30 days supply | Qty: 60 | Fill #1

## 2018-03-25 MED FILL — BUTALB-ACETAMIN-CAFF 50-325: 50-325-40 | 10 days supply | Qty: 60 | Fill #0

## 2018-03-25 MED FILL — RIZATRIPTAN 5 MG ODT: 5 | 30 days supply | Qty: 12 | Fill #6

## 2018-03-30 DIAGNOSIS — M9903 Segmental and somatic dysfunction of lumbar region: Secondary | ICD-10-CM | POA: Diagnosis not present

## 2018-03-30 DIAGNOSIS — M546 Pain in thoracic spine: Secondary | ICD-10-CM | POA: Diagnosis not present

## 2018-03-30 DIAGNOSIS — M545 Low back pain: Secondary | ICD-10-CM | POA: Diagnosis not present

## 2018-03-30 DIAGNOSIS — M542 Cervicalgia: Secondary | ICD-10-CM | POA: Diagnosis not present

## 2018-03-30 NOTE — H&P (Signed)
TOTAL KNEE REVISION ADMISSION H&P  Patient is being admitted for left revision total knee arthroplasty.  Subjective:  Chief Complaint:left knee pain.  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 The indications for the revision of the total knee arthroplasty are instability and pain. Onset of symptoms was abrupt starting less than one year ago after the fall with no improvement in course since that time.  Prior procedures on the left knee(s) include total knee arthroplasty.  Patient currently rates pain in the left knee(s) at 7 out of 10 with activity. There is worsening of pain with activity and weight bearing and instability.  Patient has no evidence of periprosthetic fracture or prosthetic loosening by imaging studies. This condition presents safety issues increasing the risk of falls. There is no current active infection.  Patient Active Problem List   Diagnosis Date Noted  . OA (osteoarthritis) of knee 12/08/2016  . 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 Cervical facet joint syndrome 12/17/2015  . Chronic bilateral low back pain without sciatica 12/17/2015  . Primary osteoarthritis of left knee 12/17/2015   Past Medical History:  Diagnosis Date  . Arthritis   . BPH (benign prostatic hyperplasia)   . Bronchitis   . DDD (degenerative disc disease), cervical   . Diabetes mellitus without complication ([facility])    type 2  . DJD (degenerative joint disease)   . Headache    migraines  . Hyperlipidemia   . Pneumonia   . Sinusitis     Past Surgical History:  Procedure Laterality Date  . Hip Resurface  2006  . MENISCUS REPAIR  2008, 1993  . SLAP Tear Repair  2008  . TONSILLECTOMY    . TOTAL KNEE  ARTHROPLASTY Left 12/08/2016   Procedure: LEFT TOTAL KNEE ARTHROPLASTY;  Surgeon: [name], MD;  Location: [facility];  Service: Orthopedics;  Laterality: Left;    Current Facility-Administered Medications  Medication Dose Route Frequency Provider Last Rate Last Dose  . 0.9 %  sodium chloride infusion  500 mL Intravenous Continuous [name], MD       Current Outpatient Medications  Medication Sig Dispense Refill Last Dose  . amoxicillin (AMOXIL) 500 MG capsule TAKE 4 CAPSULES BY MOUTH 1 HOUR PRIOR TO ORAL SURGERY  0 Taking  . baclofen (LIORESAL) 10 MG tablet Take 10 mg by mouth. Take 1-2 tablets by mouth twice a day as needed.   Taking  . butalbital-acetaminophen-caffeine (FIORICET, ESGIC) 50-325-40 MG per tablet Take by mouth 2 (two) times daily as needed for headache.   Taking  . celecoxib (CELEBREX) 200 MG capsule Take by mouth daily.  2 Taking  . CIALIS 5 MG tablet Take 5 mg by mouth daily.  3 Taking  . diazepam (VALIUM) 5 MG tablet Take 5 mg by mouth 2 (two) times daily.    Taking  . fluticasone (FLONASE) 50 MCG/ACT nasal spray Place into both nostrils daily.   Taking  . ketoconazole (NIZORAL) 2 % cream Apply 1 application topically 2 (two) times daily.   Taking  . lidocaine (LIDODERM) 5 % Place 1 patch onto the skin daily. Remove & Discard patch within 12 hours or as directed by MD   Taking  . metFORMIN (GLUCOPHAGE) 1000 MG tablet Take 1,000  mg by mouth 2 (two) times daily with a meal.   Taking  . methocarbamol (ROBAXIN) 500 MG tablet Take 1 tablet (500 mg total) by mouth every 6 (six) hours as needed for muscle spasms. 80 tablet 0 Taking  . oxyCODONE 10 MG TABS Take 1-2 tablets (10-20 mg total) by mouth every 4 (four) hours as needed for moderate pain or severe pain. 84 tablet 0 Taking  . oxymetazoline (AFRIN) 0.05 % nasal spray Place 1 spray into both nostrils 2 (two) times daily.   Taking  . SUMAtriptan (IMITREX) 50 MG tablet Take 50 mg by mouth every 2 (two) hours as needed for  migraine or headache. May repeat in 2 hours if headache persists or recurs.   Taking  . tamsulosin (FLOMAX) 0.4 MG CAPS capsule Take 0.4 mg by mouth daily.   Taking  . topiramate (TOPAMAX) 25 MG tablet Take 25 mg by mouth daily as needed.    Taking  . traMADol (ULTRAM) 50 MG tablet Take 1-2 tablets (50-100 mg total) by mouth every 6 (six) hours as needed for moderate pain. 56 tablet 0 Taking  . zolpidem (AMBIEN) 10 MG tablet Take 10 mg by mouth at bedtime as needed for sleep.   Taking   Allergies  Allergen Reactions  . Benzoin Compound     Blisters     Social History   Tobacco Use  . Smoking status: Never Smoker  . Smokeless tobacco: Never Used  Substance Use Topics  . Alcohol use: Yes    Comment: seldom    No family history on file.    Review of Systems  Constitutional: Negative for chills and fever.  HENT: Negative for congestion, sore throat and tinnitus.   Eyes: Negative for double vision, photophobia and pain.  Respiratory: Negative for cough, shortness of breath and wheezing.   Cardiovascular: Negative for chest pain, palpitations and orthopnea.  Gastrointestinal: Negative for heartburn, nausea and vomiting.  Genitourinary: Negative for dysuria, frequency and urgency.  Musculoskeletal: Positive for joint pain.  Neurological: Negative for dizziness, weakness and headaches.  Psychiatric/Behavioral: Negative for depression.   Objective: Physical Exam:  Obese and well developed. General: Alert and oriented x3, cooperative and pleasant, no acute distress. Head: normocephalic, atraumatic, neck supple. Eyes: EOMI. Respiratory: breath sounds clear in all fields, no wheezing, rales, or rhonchi. Cardiovascular: Regular rate and rhythm, no murmurs, gallops or rubs.  Abdomen: non-tender to palpation and soft, normoactive bowel sounds. Musculoskeletal: Non-antalgic gait without using assisted devices.  Left Knee Exam:  No effusion.  Range of motion is 0-130 degrees, with  laxity to valgus stress in extension.  Firm endpoint. No crepitus on range of motion of the knee.  No lateral joint line tenderness. There is medial tenderness Slight tenderness over the MCL. Calves soft and nontender. Motor function intact in LE. Strength 5/5 LE bilaterally. Neuro: Distal pulses 2+. Sensation to light touch intact in LE.  Vital signs in last 24 hours: BP: 130/88 mmHg Pulse: 76 bpm  Labs:  Estimated body mass index is 39.58 kg/m as calculated from the following:   Height as of 02/16/18: 6' (1.829 m).   Weight as of 02/16/18: 132.4 kg (291 lb 12.8 oz).  Imaging Review Plain radiographs demonstrate no evidence of loosening of the femoral or tibial components and no periprosthetic fractures. The bone quality appears to be adequate for age and reported activity level.  Preoperative templating of the joint replacement has been completed, documented, and submitted to the Operating Room personnel in  order to optimize intra-operative equipment management.  Assessment/Plan:  End stage arthritis, left knee(s) with failed previous arthroplasty.   The patient history, physical examination, clinical judgment of the provider and imaging studies are consistent with end stage degenerative joint disease of the left knee(s), previous total knee arthroplasty. Revision total knee arthroplasty is deemed medically necessary. The treatment options including medical management, injection therapy, arthroscopy and revision arthroplasty were discussed at length. The risks and benefits of revision total knee arthroplasty were presented and reviewed. The risks due to aseptic loosening, infection, stiffness, patella tracking problems, thromboembolic complications and other imponderables were discussed. The patient acknowledged the explanation, agreed to proceed with the plan and consent was signed. Patient is being admitted for inpatient treatment for surgery, pain control, PT, OT, prophylactic  antibiotics, VTE prophylaxis, progressive ambulation and ADL's and discharge planning.The patient is planning to be discharged to home with outpatient physical therapy at [facility] in [place].   Therapy Plans: outpatient therapy at [facility] in [name] Disposition: Home with family Planned DVT Prophylaxis: Xarelto 10 mg daily DME needed: none PCP: Bevelyn Buckles TXA: IV  - Patient was instructed on what medications to stop prior to surgery. - Follow-up visit in 2 weeks with Dr. Wynelle Link - Begin physical therapy following surgery - Pre-operative lab work as pre-surgical testing - Prescriptions will be provided in hospital at time of discharge  Theresa Duty, PA-C Orthopedic Surgery [facility] [place]

## 2018-04-02 MED FILL — TAMSULOSIN HCL 0.4 MG CAP: 0.4 | 90 days supply | Qty: 180 | Fill #0

## 2018-04-02 MED FILL — TADALAFIL 5 MG TABS: 5 | 30 days supply | Qty: 30 | Fill #1

## 2018-04-12 ENCOUNTER — Encounter (HOSPITAL_COMMUNITY): Payer: Self-pay

## 2018-04-12 NOTE — Patient Instructions (Signed)
Your procedure is scheduled on:  Wednesday, April 21, 2018   Surgery Time:  2:45PM-5:15PM   Report to [facility]  Entrance    Report to admitting at 12:15 PM   Call this number if you have problems the morning of surgery [phone]   Do not eat food:After Midnight.   Do NOT smoke after Midnight   May have liquids until 8:30AM morning of surgery      CLEAR LIQUID DIET   Foods Allowed                                                                     Foods Excluded  Water, Coffee and tea, regular and decaf                             liquids that you cannot  Plain Jell-O in any flavor                                             see through such as: Fruit ices (not with fruit pulp)                                     milk, soups, orange juice  Iced Popsicles                                    All solid food Carbonated beverages, regular and diet                                    Cranberry, grape and apple juices Sports drinks like Gatorade Lightly seasoned clear broth or consume(fat free) Sugar, honey syrup  Sample Menu Breakfast                                Lunch                                     Supper Cranberry juice                    Beef broth                            Chicken broth Jell-O                                     Grape juice                           Apple juice Coffee or tea  Jell-O                                      Popsicle                                                Coffee or tea                        Coffee or tea     Take these medicines the morning of surgery with A SIP OF WATER: Diazepam, Pravastatin, Tamsulosin, Flonase if needed   DO NOT TAKE ANY DIABETIC MEDICATIONS DAY OF YOUR SURGERY                               You may not have any metal on your body including jewelry, and body piercings             Do not wear lotions, powders, perfumes/cologne, or deodorant                           Men may shave face and neck.   Do not bring valuables to the hospital. [place].   Contacts, dentures or bridgework may not be worn into surgery.   Leave suitcase in the car. After surgery it may be brought to your room.   Special Instructions: Bring a copy of your healthcare power of attorney and living will documents         the day of surgery if you haven't scanned them in before.              Please read over the following fact sheets you were given:  [facility] - Preparing for Surgery Before surgery, you can play an important role.  Because skin is not sterile, your skin needs to be as free of germs as possible.  You can reduce the number of germs on your skin by washing with CHG (chlorahexidine gluconate) soap before surgery.  CHG is an antiseptic cleaner which kills germs and bonds with the skin to continue killing germs even after washing. Please DO NOT use if you have an allergy to CHG or antibacterial soaps.  If your skin becomes reddened/irritated stop using the CHG and inform your nurse when you arrive at Short Stay. Do not shave (including legs and underarms) for at least 48 hours prior to the first CHG shower.  You may shave your face/neck.  Please follow these instructions carefully:  1.  Shower with CHG Soap the night before surgery and the  morning of surgery.  2.  If you choose to wash your hair, wash your hair first as usual with your normal  shampoo.  3.  After you shampoo, rinse your hair and body thoroughly to remove the shampoo.                             4.  Use CHG as you would any other liquid soap.  You can apply chg directly to the  skin and wash.  Gently with a scrungie or clean washcloth.  5.  Apply the CHG Soap to your body ONLY FROM THE NECK DOWN.   Do   not use on face/ open                           Wound or open sores. Avoid contact with eyes, ears mouth and   genitals (private parts).                        Wash face,  Genitals (private parts) with your normal soap.             6.  Wash thoroughly, paying special attention to the area where your    surgery  will be performed.  7.  Thoroughly rinse your body with warm water from the neck down.  8.  DO NOT shower/wash with your normal soap after using and rinsing off the CHG Soap.                9.  Pat yourself dry with a clean towel.            10.  Wear clean pajamas.            11.  Place clean sheets on your bed the night of your first shower and do not  sleep with pets. Day of Surgery : Do not apply any lotions/deodorants the morning of surgery.  Please wear clean clothes to the hospital/surgery center.  FAILURE TO FOLLOW THESE INSTRUCTIONS MAY RESULT IN THE CANCELLATION OF YOUR SURGERY  PATIENT SIGNATURE_________________________________  NURSE SIGNATURE__________________________________  ________________________________________________________________________   [name]  An incentive spirometer is a tool that can help keep your lungs clear and active. This tool measures how well you are filling your lungs with each breath. Taking long deep breaths may help reverse or decrease the chance of developing breathing (pulmonary) problems (especially infection) following:  A long period of time when you are unable to move or be active. BEFORE THE PROCEDURE   If the spirometer includes an indicator to show your best effort, your nurse or respiratory therapist will set it to a desired goal.  If possible, sit up straight or lean slightly forward. Try not to slouch.  Hold the incentive spirometer in an upright position. INSTRUCTIONS FOR USE  1. Sit on the edge of your bed if possible, or sit up as far as you can in bed or on a chair. 2. Hold the incentive spirometer in an upright position. 3. Breathe out normally. 4. Place the mouthpiece in your mouth and seal your lips tightly around it. 5. Breathe in slowly and as deeply as  possible, raising the piston or the ball toward the top of the column. 6. Hold your breath for 3-5 seconds or for as long as possible. Allow the piston or ball to fall to the bottom of the column. 7. Remove the mouthpiece from your mouth and breathe out normally. 8. Rest for a few seconds and repeat Steps 1 through 7 at least 10 times every 1-2 hours when you are awake. Take your time and take a few normal breaths between deep breaths. 9. The spirometer may include an indicator to show your best effort. Use the indicator as a goal to work toward during each repetition. 10. After each set of 10 deep breaths, practice coughing to be sure your lungs are clear. If  you have an incision (the cut made at the time of surgery), support your incision when coughing by placing a pillow or rolled up towels firmly against it. Once you are able to get out of bed, walk around indoors and cough well. You may stop using the incentive spirometer when instructed by your caregiver.  RISKS AND COMPLICATIONS  Take your time so you do not get dizzy or light-headed.  If you are in pain, you may need to take or ask for pain medication before doing incentive spirometry. It is harder to take a deep breath if you are having pain. AFTER USE  Rest and breathe slowly and easily.  It can be helpful to keep track of a log of your progress. Your caregiver can provide you with a simple table to help with this. If you are using the spirometer at home, follow these instructions: [place] IF:   You are having difficultly using the spirometer.  You have trouble using the spirometer as often as instructed.  Your pain medication is not giving enough relief while using the spirometer.  You develop fever of 100.5 F (38.1 C) or higher. SEEK IMMEDIATE MEDICAL CARE IF:   You cough up bloody sputum that had not been present before.  You develop fever of 102 F (38.9 C) or greater.  You develop worsening pain at or near  the incision site. MAKE SURE YOU:   Understand these instructions.  Will watch your condition.  Will get help right away if you are not doing well or get worse. Document Released: 02/09/2007 Document Revised: 12/22/2011 Document Reviewed: 04/12/2007 ExitCare Patient Information 2014 [employer], [place].   ________________________________________________________________________  WHAT IS A BLOOD TRANSFUSION? Blood Transfusion Information  A transfusion is the replacement of blood or some of its parts. Blood is made up of multiple cells which provide different functions.  Red blood cells carry oxygen and are used for blood loss replacement.  White blood cells fight against infection.  Platelets control bleeding.  Plasma helps clot blood.  Other blood products are available for specialized needs, such as hemophilia or other clotting disorders. BEFORE THE TRANSFUSION  Who gives blood for transfusions?   Healthy volunteers who are fully evaluated to make sure their blood is safe. This is blood bank blood. Transfusion therapy is the safest it has ever been in the practice of medicine. Before blood is taken from a donor, a complete history is taken to make sure that person has no history of diseases nor engages in risky social behavior (examples are intravenous drug use or sexual activity with multiple partners). The donor's travel history is screened to minimize risk of transmitting infections, such as malaria. The donated blood is tested for signs of infectious diseases, such as HIV and hepatitis. The blood is then tested to be sure it is compatible with you in order to minimize the chance of a transfusion reaction. If you or a relative donates blood, this is often done in anticipation of surgery and is not appropriate for emergency situations. It takes many days to process the donated blood. RISKS AND COMPLICATIONS Although transfusion therapy is very safe and saves many lives, the main  dangers of transfusion include:   Getting an infectious disease.  Developing a transfusion reaction. This is an allergic reaction to something in the blood you were given. Every precaution is taken to prevent this. The decision to have a blood transfusion has been considered carefully by your caregiver before blood is given. Blood  is not given unless the benefits outweigh the risks. AFTER THE TRANSFUSION  Right after receiving a blood transfusion, you will usually feel much better and more energetic. This is especially true if your red blood cells have gotten low (anemic). The transfusion raises the level of the red blood cells which carry oxygen, and this usually causes an energy increase.  The nurse administering the transfusion will monitor you carefully for complications. HOME CARE INSTRUCTIONS  No special instructions are needed after a transfusion. You may find your energy is better. Speak with your caregiver about any limitations on activity for underlying diseases you may have. SEEK MEDICAL CARE IF:   Your condition is not improving after your transfusion.  You develop redness or irritation at the intravenous (IV) site. SEEK IMMEDIATE MEDICAL CARE IF:  Any of the following symptoms occur over the next 12 hours:  Shaking chills.  You have a temperature by mouth above 102 F (38.9 C), not controlled by medicine.  Chest, back, or muscle pain.  People around you feel you are not acting correctly or are confused.  Shortness of breath or difficulty breathing.  Dizziness and fainting.  You get a rash or develop hives.  You have a decrease in urine output.  Your urine turns a dark color or changes to pink, red, or brown. Any of the following symptoms occur over the next 10 days:  You have a temperature by mouth above 102 F (38.9 C), not controlled by medicine.  Shortness of breath.  Weakness after normal activity.  The white part of the eye turns yellow  (jaundice).  You have a decrease in the amount of urine or are urinating less often.  Your urine turns a dark color or changes to pink, red, or brown. Document Released: 09/26/2000 Document Revised: 12/22/2011 Document Reviewed: 05/15/2008 [facility] Patient Information 2014 [place], [place].  _______________________________________________________________________

## 2018-04-13 ENCOUNTER — Encounter (HOSPITAL_COMMUNITY)
Admission: RE | Admit: 2018-04-13 | Discharge: 2018-04-13 | Disposition: A | Discharge status: Home / Self Care | Payer: 59 | Source: Ambulatory Visit | Attending: Orthopedic Surgery | Admitting: Orthopedic Surgery

## 2018-04-13 ENCOUNTER — Other Ambulatory Visit: Payer: Self-pay

## 2018-04-13 ENCOUNTER — Encounter (HOSPITAL_COMMUNITY): Payer: Self-pay

## 2018-04-13 DIAGNOSIS — Z01810 Encounter for preprocedural cardiovascular examination: Secondary | ICD-10-CM | POA: Insufficient documentation

## 2018-04-13 DIAGNOSIS — M546 Pain in thoracic spine: Secondary | ICD-10-CM | POA: Diagnosis not present

## 2018-04-13 DIAGNOSIS — M9903 Segmental and somatic dysfunction of lumbar region: Secondary | ICD-10-CM | POA: Diagnosis not present

## 2018-04-13 DIAGNOSIS — M545 Low back pain: Secondary | ICD-10-CM | POA: Diagnosis not present

## 2018-04-13 DIAGNOSIS — Z01812 Encounter for preprocedural laboratory examination: Secondary | ICD-10-CM | POA: Insufficient documentation

## 2018-04-13 DIAGNOSIS — M542 Cervicalgia: Secondary | ICD-10-CM | POA: Diagnosis not present

## 2018-04-13 HISTORY — DX: Pain in unspecified shoulder: M25.519

## 2018-04-13 HISTORY — DX: Ganglion, unspecified site: M67.40

## 2018-04-13 HISTORY — DX: Anxiety disorder, unspecified: F41.9

## 2018-04-13 HISTORY — DX: Nonspecific reaction to cell mediated immunity measurement of gamma interferon antigen response without active tuberculosis: R76.12

## 2018-04-13 HISTORY — DX: Atherosclerosis of aorta: I70.0

## 2018-04-13 HISTORY — DX: Migraine, unspecified, not intractable, without status migrainosus: G43.909

## 2018-04-13 HISTORY — DX: Diverticulosis of intestine, part unspecified, without perforation or abscess without bleeding: K57.90

## 2018-04-13 HISTORY — DX: Other hemorrhoids: K64.8

## 2018-04-13 LAB — CBC
HCT: 42 % (ref 39.0–52.0)
Hemoglobin: 14.2 g/dL (ref 13.0–17.0)
MCH: 31.8 pg (ref 26.0–34.0)
MCHC: 33.8 g/dL (ref 30.0–36.0)
MCV: 94 fL (ref 78.0–100.0)
Platelets: 350 10*3/uL (ref 150–400)
RBC: 4.47 MIL/uL (ref 4.22–5.81)
RDW: 14.1 % (ref 11.5–15.5)
WBC: 7.5 10*3/uL (ref 4.0–10.5)

## 2018-04-13 LAB — SURGICAL PCR SCREEN
MRSA, PCR: NEGATIVE
Staphylococcus aureus: NEGATIVE

## 2018-04-13 LAB — COMPREHENSIVE METABOLIC PANEL
ALT: 24 U/L (ref 0–44)
AST: 23 U/L (ref 15–41)
Albumin: 3.7 g/dL (ref 3.5–5.0)
Alkaline Phosphatase: 58 U/L (ref 38–126)
Anion gap: 5 (ref 5–15)
BUN: 14 mg/dL (ref 6–20)
CO2: 25 mmol/L (ref 22–32)
Calcium: 8.6 mg/dL — ABNORMAL LOW (ref 8.9–10.3)
Chloride: 112 mmol/L — ABNORMAL HIGH (ref 98–111)
Creatinine, Ser: 0.91 mg/dL (ref 0.61–1.24)
GFR calc Af Amer: >60 mL/min (ref >60)
GFR calc non Af Amer: >60 mL/min (ref >60)
Glucose, Bld: 122 mg/dL — ABNORMAL HIGH (ref 70–99)
Potassium: 4.3 mmol/L (ref 3.5–5.1)
Sodium: 142 mmol/L (ref 135–145)
Total Bilirubin: 0.6 mg/dL (ref 0.3–1.2)
Total Protein: 6.8 g/dL (ref 6.5–8.1)

## 2018-04-13 LAB — GLUCOSE, CAPILLARY: Glucose-Capillary: 120 mg/dL — ABNORMAL HIGH (ref 70–99)

## 2018-04-13 LAB — HEMOGLOBIN A1C
Hgb A1c MFr Bld: 5.4 % (ref 4.8–5.6)
Mean Plasma Glucose: 108.28 mg/dL

## 2018-04-13 LAB — URINALYSIS, ROUTINE W REFLEX MICROSCOPIC
Bilirubin Urine: NEGATIVE
Glucose, UA: NEGATIVE mg/dL
Hgb urine dipstick: NEGATIVE
Ketones, ur: NEGATIVE mg/dL
Leukocytes, UA: NEGATIVE
Nitrite: NEGATIVE
Protein, ur: NEGATIVE mg/dL
Specific Gravity, Urine: 1.023 (ref 1.005–1.030)
pH: 7 (ref 5.0–8.0)

## 2018-04-13 LAB — APTT: aPTT: 30 seconds (ref 24–36)

## 2018-04-13 LAB — PROTIME-INR
INR: 0.96
Prothrombin Time: 12.6 seconds (ref 11.4–15.2)

## 2018-04-13 NOTE — Pre-Procedure Instructions (Signed)
Dr. Sherral Hammers was unable to sign consent at pre op appointment because mass removal was not stated on consent order.  Inbox message through epic sent to Dr. Wynelle Link and Theresa Duty, PA to request a consent be placed in epic.

## 2018-04-13 NOTE — Pre-Procedure Instructions (Signed)
CMP results 04/13/2018 faxed to Dr. Maureen Ralphs via epic.

## 2018-04-14 ENCOUNTER — Ambulatory Visit: Payer: Self-pay | Admitting: Orthopedic Surgery

## 2018-04-20 MED ORDER — BUPIVACAINE LIPOSOME 1.3 % IJ SUSP
20.0000 mL | Freq: Once | INTRAMUSCULAR | Status: DC
  Filled 2018-04-20: qty 20

## 2018-04-20 MED ORDER — DEXTROSE 5 % IV SOLN
3.0000 g | INTRAVENOUS | Status: AC
  Administered 2018-04-21: 3 g via INTRAVENOUS
  Filled 2018-04-20: qty 3

## 2018-04-20 MED ORDER — TRANEXAMIC ACID 1000 MG/10ML IV SOLN
1000.0000 mg | INTRAVENOUS | Status: AC
  Administered 2018-04-21: 1000 mg via INTRAVENOUS
  Filled 2018-04-20: qty 1100

## 2018-04-21 ENCOUNTER — Encounter (HOSPITAL_COMMUNITY)
Admission: RE | Disposition: A | Discharge status: Home / Self Care | Payer: Self-pay | Source: Ambulatory Visit | Attending: Orthopedic Surgery

## 2018-04-21 ENCOUNTER — Encounter (HOSPITAL_COMMUNITY): Payer: Self-pay | Admitting: Certified Registered Nurse Anesthetist

## 2018-04-21 ENCOUNTER — Inpatient Hospital Stay (HOSPITAL_COMMUNITY)
Admission: RE | Admit: 2018-04-21 | Discharge: 2018-04-22 | DRG: 465 | Disposition: A | Discharge status: Home / Self Care | Payer: 59 | Source: Ambulatory Visit | Attending: Orthopedic Surgery | Admitting: Orthopedic Surgery

## 2018-04-21 ENCOUNTER — Inpatient Hospital Stay (HOSPITAL_COMMUNITY): Payer: 59 | Admitting: Certified Registered Nurse Anesthetist

## 2018-04-21 ENCOUNTER — Other Ambulatory Visit: Payer: Self-pay

## 2018-04-21 DIAGNOSIS — D1723 Benign lipomatous neoplasm of skin and subcutaneous tissue of right leg: Secondary | ICD-10-CM | POA: Diagnosis not present

## 2018-04-21 DIAGNOSIS — T84023A Instability of internal left knee prosthesis, initial encounter: Secondary | ICD-10-CM | POA: Diagnosis not present

## 2018-04-21 DIAGNOSIS — E1151 Type 2 diabetes mellitus with diabetic peripheral angiopathy without gangrene: Secondary | ICD-10-CM | POA: Diagnosis present

## 2018-04-21 DIAGNOSIS — Y792 Prosthetic and other implants, materials and accessory orthopedic devices associated with adverse incidents: Secondary | ICD-10-CM | POA: Diagnosis present

## 2018-04-21 DIAGNOSIS — Z888 Allergy status to other drugs, medicaments and biological substances status: Secondary | ICD-10-CM

## 2018-04-21 DIAGNOSIS — Z79899 Other long term (current) drug therapy: Secondary | ICD-10-CM

## 2018-04-21 DIAGNOSIS — E785 Hyperlipidemia, unspecified: Secondary | ICD-10-CM | POA: Diagnosis present

## 2018-04-21 DIAGNOSIS — Z9181 History of falling: Secondary | ICD-10-CM | POA: Diagnosis not present

## 2018-04-21 DIAGNOSIS — Z7984 Long term (current) use of oral hypoglycemic drugs: Secondary | ICD-10-CM | POA: Diagnosis not present

## 2018-04-21 DIAGNOSIS — F419 Anxiety disorder, unspecified: Secondary | ICD-10-CM | POA: Diagnosis present

## 2018-04-21 DIAGNOSIS — E669 Obesity, unspecified: Secondary | ICD-10-CM | POA: Diagnosis present

## 2018-04-21 DIAGNOSIS — M25562 Pain in left knee: Secondary | ICD-10-CM | POA: Diagnosis not present

## 2018-04-21 DIAGNOSIS — N400 Enlarged prostate without lower urinary tract symptoms: Secondary | ICD-10-CM | POA: Diagnosis present

## 2018-04-21 DIAGNOSIS — Z6839 Body mass index (BMI) 39.0-39.9, adult: Secondary | ICD-10-CM | POA: Diagnosis not present

## 2018-04-21 DIAGNOSIS — T84093A Other mechanical complication of internal left knee prosthesis, initial encounter: Secondary | ICD-10-CM | POA: Diagnosis not present

## 2018-04-21 DIAGNOSIS — M7989 Other specified soft tissue disorders: Secondary | ICD-10-CM | POA: Diagnosis not present

## 2018-04-21 DIAGNOSIS — G8918 Other acute postprocedural pain: Secondary | ICD-10-CM | POA: Diagnosis not present

## 2018-04-21 DIAGNOSIS — T84018A Broken internal joint prosthesis, other site, initial encounter: Secondary | ICD-10-CM

## 2018-04-21 DIAGNOSIS — Z96659 Presence of unspecified artificial knee joint: Secondary | ICD-10-CM

## 2018-04-21 DIAGNOSIS — D2121 Benign neoplasm of connective and other soft tissue of right lower limb, including hip: Secondary | ICD-10-CM | POA: Diagnosis not present

## 2018-04-21 HISTORY — PX: MASS EXCISION: SHX2000

## 2018-04-21 HISTORY — PX: TOTAL KNEE ARTHROPLASTY WITH REVISION COMPONENTS: SHX6198

## 2018-04-21 LAB — GLUCOSE, CAPILLARY
Glucose-Capillary: 91 mg/dL (ref 70–99)
Glucose-Capillary: 78 mg/dL (ref 70–99)

## 2018-04-21 LAB — TYPE AND SCREEN
ABO/RH(D): O NEG
Antibody Screen: NEGATIVE

## 2018-04-21 SURGERY — TOTAL KNEE ARTHROPLASTY WITH REVISION COMPONENTS
Anesthesia: Monitor Anesthesia Care | Site: Thigh | Laterality: Right

## 2018-04-21 MED ORDER — SUMATRIPTAN SUCCINATE 50 MG PO TABS
50.0000 mg | ORAL_TABLET | ORAL | Status: DC | PRN
  Filled 2018-04-21: qty 1

## 2018-04-21 MED ORDER — SODIUM CHLORIDE 0.9 % IV SOLN
INTRAVENOUS | Status: DC
  Administered 2018-04-21: 18:00:00 via INTRAVENOUS

## 2018-04-21 MED ORDER — METHOCARBAMOL 1000 MG/10ML IJ SOLN
500.0000 mg | Freq: Four times a day (QID) | INTRAVENOUS | Status: DC | PRN
  Filled 2018-04-21: qty 5

## 2018-04-21 MED ORDER — FLEET ENEMA 7-19 GM/118ML RE ENEM: 1.0000 | ENEMA | Freq: Once | RECTAL | Status: DC | PRN

## 2018-04-21 MED ORDER — PROPOFOL 10 MG/ML IV BOLUS
INTRAVENOUS | Status: AC
  Filled 2018-04-21: qty 20

## 2018-04-21 MED ORDER — STERILE WATER FOR IRRIGATION IR SOLN
Status: DC | PRN
  Administered 2018-04-21: 2000 mL

## 2018-04-21 MED ORDER — OXYCODONE HCL 5 MG/5ML PO SOLN
5.0000 mg | Freq: Once | ORAL | Status: DC | PRN
  Filled 2018-04-21: qty 5

## 2018-04-21 MED ORDER — PRAVASTATIN SODIUM 20 MG PO TABS
40.0000 mg | ORAL_TABLET | Freq: Every day | ORAL | Status: DC
  Administered 2018-04-22: 40 mg via ORAL
  Filled 2018-04-21: qty 2

## 2018-04-21 MED ORDER — GLYCOPYRROLATE PF 0.2 MG/ML IJ SOSY
PREFILLED_SYRINGE | INTRAMUSCULAR | Status: AC
  Filled 2018-04-21: qty 1

## 2018-04-21 MED ORDER — TOPIRAMATE 25 MG PO TABS
25.0000 mg | ORAL_TABLET | Freq: Every day | ORAL | Status: DC
  Administered 2018-04-21: 25 mg via ORAL
  Filled 2018-04-21: qty 1

## 2018-04-21 MED ORDER — SODIUM CHLORIDE 0.9 % IJ SOLN
INTRAMUSCULAR | Status: AC
  Filled 2018-04-21: qty 50

## 2018-04-21 MED ORDER — FLUTICASONE PROPIONATE 50 MCG/ACT NA SUSP
2.0000 | Freq: Every day | NASAL | Status: DC | PRN
  Filled 2018-04-21: qty 16

## 2018-04-21 MED ORDER — OXYCODONE HCL 5 MG PO TABS: 5.0000 mg | ORAL_TABLET | Freq: Once | ORAL | Status: DC | PRN

## 2018-04-21 MED ORDER — ONDANSETRON HCL 4 MG PO TABS: 4.0000 mg | ORAL_TABLET | Freq: Four times a day (QID) | ORAL | Status: DC | PRN

## 2018-04-21 MED ORDER — 0.9 % SODIUM CHLORIDE (POUR BTL) OPTIME
TOPICAL | Status: DC | PRN
  Administered 2018-04-21: 1000 mL

## 2018-04-21 MED ORDER — METOCLOPRAMIDE HCL 5 MG/ML IJ SOLN: 5.0000 mg | Freq: Three times a day (TID) | INTRAMUSCULAR | Status: DC | PRN

## 2018-04-21 MED ORDER — ONDANSETRON HCL 4 MG/2ML IJ SOLN: 4.0000 mg | Freq: Four times a day (QID) | INTRAMUSCULAR | Status: DC | PRN

## 2018-04-21 MED ORDER — METHOCARBAMOL 500 MG PO TABS: 500.0000 mg | ORAL_TABLET | Freq: Four times a day (QID) | ORAL | Status: DC | PRN

## 2018-04-21 MED ORDER — LACTATED RINGERS IV SOLN
INTRAVENOUS | Status: DC
  Administered 2018-04-21: 13:00:00 via INTRAVENOUS

## 2018-04-21 MED ORDER — TAMSULOSIN HCL 0.4 MG PO CAPS
0.4000 mg | ORAL_CAPSULE | Freq: Two times a day (BID) | ORAL | Status: DC
  Administered 2018-04-21 – 2018-04-22 (×2): 0.4 mg via ORAL
  Filled 2018-04-21 (×2): qty 1

## 2018-04-21 MED ORDER — BUTALBITAL-APAP-CAFFEINE 50-325-40 MG PO TABS
1.0000 | ORAL_TABLET | ORAL | Status: DC | PRN
Start: 2018-04-21 — End: 2018-04-22

## 2018-04-21 MED ORDER — BUPIVACAINE HCL (PF) 0.25 % IJ SOLN
INTRAMUSCULAR | Status: AC
  Filled 2018-04-21: qty 30

## 2018-04-21 MED ORDER — ROPIVACAINE HCL 7.5 MG/ML IJ SOLN
INTRAMUSCULAR | Status: DC | PRN
  Administered 2018-04-21: 20 mL via PERINEURAL

## 2018-04-21 MED ORDER — DOCUSATE SODIUM 100 MG PO CAPS
100.0000 mg | ORAL_CAPSULE | Freq: Two times a day (BID) | ORAL | Status: DC
  Administered 2018-04-21 – 2018-04-22 (×2): 100 mg via ORAL
  Filled 2018-04-21 (×2): qty 1

## 2018-04-21 MED ORDER — CHLORHEXIDINE GLUCONATE 4 % EX LIQD: 60.0000 mL | Freq: Once | CUTANEOUS | Status: DC

## 2018-04-21 MED ORDER — PROPOFOL 10 MG/ML IV BOLUS
INTRAVENOUS | Status: AC
  Filled 2018-04-21: qty 60

## 2018-04-21 MED ORDER — GABAPENTIN 300 MG PO CAPS
300.0000 mg | ORAL_CAPSULE | Freq: Once | ORAL | Status: AC
  Administered 2018-04-21: 300 mg via ORAL
  Filled 2018-04-21: qty 1

## 2018-04-21 MED ORDER — FENTANYL CITRATE (PF) 100 MCG/2ML IJ SOLN: 25.0000 ug | INTRAMUSCULAR | Status: DC | PRN

## 2018-04-21 MED ORDER — MIDAZOLAM HCL 2 MG/2ML IJ SOLN
1.0000 mg | INTRAMUSCULAR | Status: DC
  Administered 2018-04-21: 2 mg via INTRAVENOUS
  Filled 2018-04-21: qty 2

## 2018-04-21 MED ORDER — ACETAMINOPHEN 10 MG/ML IV SOLN
1000.0000 mg | Freq: Four times a day (QID) | INTRAVENOUS | Status: DC
  Administered 2018-04-21: 1000 mg via INTRAVENOUS
  Filled 2018-04-21: qty 100

## 2018-04-21 MED ORDER — MIDAZOLAM HCL 2 MG/2ML IJ SOLN
INTRAMUSCULAR | Status: AC
  Filled 2018-04-21: qty 2

## 2018-04-21 MED ORDER — RIZATRIPTAN BENZOATE 5 MG PO TBDP: 5.0000 mg | ORAL_TABLET | ORAL | Status: DC | PRN

## 2018-04-21 MED ORDER — DEXAMETHASONE SODIUM PHOSPHATE 10 MG/ML IJ SOLN
8.0000 mg | Freq: Once | INTRAMUSCULAR | Status: AC
  Administered 2018-04-21: 10 mg via INTRAVENOUS

## 2018-04-21 MED ORDER — ZOLPIDEM TARTRATE 10 MG PO TABS
10.0000 mg | ORAL_TABLET | Freq: Every evening | ORAL | Status: DC | PRN
  Administered 2018-04-21: 10 mg via ORAL
  Filled 2018-04-21: qty 1

## 2018-04-21 MED ORDER — PHENOL 1.4 % MT LIQD
1.0000 | OROMUCOSAL | Status: DC | PRN
  Filled 2018-04-21: qty 177

## 2018-04-21 MED ORDER — CEFAZOLIN SODIUM-DEXTROSE 2-4 GM/100ML-% IV SOLN
2.0000 g | Freq: Four times a day (QID) | INTRAVENOUS | Status: AC
  Administered 2018-04-21 – 2018-04-22 (×2): 2 g via INTRAVENOUS
  Filled 2018-04-21 (×2): qty 100

## 2018-04-21 MED ORDER — FENTANYL CITRATE (PF) 100 MCG/2ML IJ SOLN
50.0000 ug | INTRAMUSCULAR | Status: DC
  Administered 2018-04-21: 100 ug via INTRAVENOUS
  Filled 2018-04-21: qty 2

## 2018-04-21 MED ORDER — PROPOFOL 500 MG/50ML IV EMUL
INTRAVENOUS | Status: DC | PRN
  Administered 2018-04-21: 100 ug/kg/min via INTRAVENOUS

## 2018-04-21 MED ORDER — BUPIVACAINE IN DEXTROSE 0.75-8.25 % IT SOLN
INTRATHECAL | Status: DC | PRN
  Administered 2018-04-21: 2 mL via INTRATHECAL

## 2018-04-21 MED ORDER — OXYCODONE HCL 5 MG PO TABS: 10.0000 mg | ORAL_TABLET | ORAL | Status: DC | PRN

## 2018-04-21 MED ORDER — OXYMETAZOLINE HCL 0.05 % NA SOLN
2.0000 | Freq: Two times a day (BID) | NASAL | Status: DC | PRN
  Filled 2018-04-21: qty 15

## 2018-04-21 MED ORDER — ACETAMINOPHEN 500 MG PO TABS
1000.0000 mg | ORAL_TABLET | Freq: Four times a day (QID) | ORAL | Status: DC
  Administered 2018-04-21 – 2018-04-22 (×2): 1000 mg via ORAL
  Filled 2018-04-21 (×2): qty 2

## 2018-04-21 MED ORDER — HYDROMORPHONE HCL 1 MG/ML IJ SOLN: 0.5000 mg | INTRAMUSCULAR | Status: DC | PRN

## 2018-04-21 MED ORDER — BUPIVACAINE HCL 0.25 % IJ SOLN
INTRAMUSCULAR | Status: DC | PRN
  Administered 2018-04-21: 30 mL

## 2018-04-21 MED ORDER — METOCLOPRAMIDE HCL 5 MG PO TABS: 5.0000 mg | ORAL_TABLET | Freq: Three times a day (TID) | ORAL | Status: DC | PRN

## 2018-04-21 MED ORDER — CYCLOBENZAPRINE HCL 10 MG PO TABS
10.0000 mg | ORAL_TABLET | Freq: Three times a day (TID) | ORAL | Status: DC | PRN
Start: 2018-04-21 — End: 2018-04-22

## 2018-04-21 MED ORDER — TADALAFIL 5 MG PO TABS
5.0000 mg | ORAL_TABLET | Freq: Every day | ORAL | Status: DC
  Administered 2018-04-22: 5 mg via ORAL
  Filled 2018-04-21: qty 1

## 2018-04-21 MED ORDER — MENTHOL 3 MG MT LOZG: 1.0000 | LOZENGE | OROMUCOSAL | Status: DC | PRN

## 2018-04-21 MED ORDER — PROPOFOL 10 MG/ML IV BOLUS
INTRAVENOUS | Status: DC | PRN
  Administered 2018-04-21: 30 mg via INTRAVENOUS
  Administered 2018-04-21: 20 mg via INTRAVENOUS

## 2018-04-21 MED ORDER — MIDAZOLAM HCL 5 MG/5ML IJ SOLN
INTRAMUSCULAR | Status: DC | PRN
  Administered 2018-04-21: 2 mg via INTRAVENOUS

## 2018-04-21 MED ORDER — LACTATED RINGERS IV SOLN
INTRAVENOUS | Status: DC
  Administered 2018-04-21: 14:00:00 via INTRAVENOUS

## 2018-04-21 MED ORDER — DIPHENHYDRAMINE HCL 12.5 MG/5ML PO ELIX: 12.5000 mg | ORAL_SOLUTION | ORAL | Status: DC | PRN

## 2018-04-21 MED ORDER — ONDANSETRON HCL 4 MG/2ML IJ SOLN
INTRAMUSCULAR | Status: DC | PRN
  Administered 2018-04-21: 4 mg via INTRAVENOUS

## 2018-04-21 MED ORDER — BACLOFEN 10 MG PO TABS: 10.0000 mg | ORAL_TABLET | Freq: Two times a day (BID) | ORAL | Status: DC | PRN

## 2018-04-21 MED ORDER — DEXAMETHASONE SODIUM PHOSPHATE 10 MG/ML IJ SOLN: 10.0000 mg | Freq: Once | INTRAMUSCULAR | Status: DC

## 2018-04-21 MED ORDER — GLYCOPYRROLATE 0.2 MG/ML IJ SOLN
INTRAMUSCULAR | Status: DC | PRN
  Administered 2018-04-21: 0.2 mg via INTRAVENOUS

## 2018-04-21 MED ORDER — POLYETHYLENE GLYCOL 3350 17 G PO PACK: 17.0000 g | PACK | Freq: Every day | ORAL | Status: DC | PRN

## 2018-04-21 MED ORDER — BISACODYL 10 MG RE SUPP: 10.0000 mg | Freq: Every day | RECTAL | Status: DC | PRN

## 2018-04-21 MED ORDER — ONDANSETRON HCL 4 MG/2ML IJ SOLN
INTRAMUSCULAR | Status: AC
  Filled 2018-04-21: qty 2

## 2018-04-21 MED ORDER — PSEUDOEPHEDRINE HCL ER 120 MG PO TB12
120.0000 mg | ORAL_TABLET | Freq: Every day | ORAL | Status: DC | PRN
Start: 2018-04-21 — End: 2018-04-22
  Filled 2018-04-21: qty 1

## 2018-04-21 MED ORDER — OXYCODONE HCL 5 MG PO TABS
5.0000 mg | ORAL_TABLET | ORAL | Status: DC | PRN
  Administered 2018-04-21 – 2018-04-22 (×3): 10 mg via ORAL
  Filled 2018-04-21 (×3): qty 2

## 2018-04-21 MED ORDER — DEXAMETHASONE SODIUM PHOSPHATE 10 MG/ML IJ SOLN
INTRAMUSCULAR | Status: AC
  Filled 2018-04-21: qty 1

## 2018-04-21 MED ORDER — TRAMADOL HCL 50 MG PO TABS
50.0000 mg | ORAL_TABLET | Freq: Four times a day (QID) | ORAL | Status: DC | PRN
  Administered 2018-04-22: 100 mg via ORAL
  Filled 2018-04-21: qty 2

## 2018-04-21 MED ORDER — DIAZEPAM 5 MG PO TABS
5.0000 mg | ORAL_TABLET | Freq: Two times a day (BID) | ORAL | Status: DC
  Administered 2018-04-21 – 2018-04-22 (×2): 5 mg via ORAL
  Filled 2018-04-21 (×2): qty 1

## 2018-04-21 MED ORDER — ASPIRIN EC 325 MG PO TBEC: 325.0000 mg | DELAYED_RELEASE_TABLET | Freq: Two times a day (BID) | ORAL | Status: DC

## 2018-04-21 SURGICAL SUPPLY — 47 items
BAG DECANTER FOR FLEXI CONT (MISCELLANEOUS) ×3 IMPLANT
BAG ZIPLOCK 12X15 (MISCELLANEOUS) IMPLANT
BANDAGE ACE 6X5 VEL STRL LF (GAUZE/BANDAGES/DRESSINGS) ×9 IMPLANT
BLADE SAG 18X100X1.27 (BLADE) ×3 IMPLANT
BLADE SAW SGTL 11.0X1.19X90.0M (BLADE) ×3 IMPLANT
CLOTH BEACON ORANGE TIMEOUT ST (SAFETY) ×3 IMPLANT
COVER SURGICAL LIGHT HANDLE (MISCELLANEOUS) ×3 IMPLANT
CUFF TOURN SGL QUICK 34 (TOURNIQUET CUFF) ×1
CUFF TRNQT CYL 34X4X40X1 (TOURNIQUET CUFF) ×2 IMPLANT
DECANTER SPIKE VIAL GLASS SM (MISCELLANEOUS) ×3 IMPLANT
DRAPE U-SHAPE 47X51 STRL (DRAPES) ×3 IMPLANT
DRSG ADAPTIC 3X8 NADH LF (GAUZE/BANDAGES/DRESSINGS) ×6 IMPLANT
DURAPREP 26ML APPLICATOR (WOUND CARE) ×6 IMPLANT
ELECT REM PT RETURN 15FT ADLT (MISCELLANEOUS) ×3 IMPLANT
EVACUATOR 1/8 PVC DRAIN (DRAIN) ×3 IMPLANT
GAUZE SPONGE 4X4 12PLY STRL (GAUZE/BANDAGES/DRESSINGS) ×6 IMPLANT
GLOVE BIO SURGEON STRL SZ 6.5 (GLOVE) ×3 IMPLANT
GLOVE BIO SURGEON STRL SZ8 (GLOVE) ×3 IMPLANT
GLOVE BIOGEL PI IND STRL 7.0 (GLOVE) ×2 IMPLANT
GLOVE BIOGEL PI IND STRL 8 (GLOVE) ×2 IMPLANT
GLOVE BIOGEL PI INDICATOR 7.0 (GLOVE) ×1
GLOVE BIOGEL PI INDICATOR 8 (GLOVE) ×1
GOWN STRL REUS W/TWL LRG LVL3 (GOWN DISPOSABLE) ×12 IMPLANT
HANDPIECE INTERPULSE COAX TIP (DISPOSABLE) ×1
HOLDER FOLEY CATH W/STRAP (MISCELLANEOUS) ×3 IMPLANT
IMMOBILIZER KNEE 20 (SOFTGOODS) ×3
IMMOBILIZER KNEE 20 THIGH 36 (SOFTGOODS) ×2 IMPLANT
INSERT ATTU PSRP SZ7 14MM KNEE (Insert) ×3 IMPLANT
MANIFOLD NEPTUNE II (INSTRUMENTS) ×3 IMPLANT
NS IRRIG 1000ML POUR BTL (IV SOLUTION) ×3 IMPLANT
PACK TOTAL KNEE CUSTOM (KITS) ×3 IMPLANT
PAD ABD 8X10 STRL (GAUZE/BANDAGES/DRESSINGS) ×6 IMPLANT
PADDING CAST COTTON 6X4 STRL (CAST SUPPLIES) ×12 IMPLANT
POSITIONER SURGICAL ARM (MISCELLANEOUS) ×3 IMPLANT
SET HNDPC FAN SPRY TIP SCT (DISPOSABLE) ×2 IMPLANT
STRIP CLOSURE SKIN 1/2X4 (GAUZE/BANDAGES/DRESSINGS) ×9 IMPLANT
SUT STRATAFIX 0 PDS 27 VIOLET (SUTURE) ×3
SUT VIC AB 2-0 CT1 27 (SUTURE) ×3
SUT VIC AB 2-0 CT1 TAPERPNT 27 (SUTURE) ×6 IMPLANT
SUTURE STRATFX 0 PDS 27 VIOLET (SUTURE) ×2 IMPLANT
SWAB COLLECTION DEVICE MRSA (MISCELLANEOUS) IMPLANT
SWAB CULTURE ESWAB REG 1ML (MISCELLANEOUS) IMPLANT
TOWER CARTRIDGE SMART MIX (DISPOSABLE) ×3 IMPLANT
TRAY FOLEY MTR SLVR 16FR STAT (SET/KITS/TRAYS/PACK) ×3 IMPLANT
TUBE KAMVAC SUCTION (TUBING) IMPLANT
WRAP KNEE MAXI GEL POST OP (GAUZE/BANDAGES/DRESSINGS) ×3 IMPLANT
YANKAUER SUCT BULB TIP 10FT TU (MISCELLANEOUS) ×3 IMPLANT

## 2018-04-21 NOTE — Anesthesia Procedure Notes (Signed)
Spinal  Patient location during procedure: OR Start time: 04/21/2018 2:27 PM End time: 04/21/2018 2:29 PM Staffing Anesthesiologist: Albertha Ghee, MD Performed: anesthesiologist  Preanesthetic Checklist Completed: patient identified, surgical consent, pre-op evaluation, timeout performed, IV checked, risks and benefits discussed and monitors and equipment checked Spinal Block Patient position: sitting Prep: DuraPrep Patient monitoring: cardiac monitor, continuous pulse ox and blood pressure Approach: midline Location: L3-4 Injection technique: single-shot Needle Needle type: Pencan  Needle gauge: 24 G Needle length: 9 cm Assessment Sensory level: T10 Additional Notes Functioning IV was confirmed and monitors were applied. Sterile prep and drape, including hand hygiene and sterile gloves were used. The patient was positioned and the spine was prepped. The skin was anesthetized with lidocaine.  Free flow of clear CSF was obtained prior to injecting local anesthetic into the CSF.  The spinal needle aspirated freely following injection.  The needle was carefully withdrawn.  The patient tolerated the procedure well.

## 2018-04-21 NOTE — Anesthesia Postprocedure Evaluation (Signed)
Anesthesia Post Note  Patient: Daniel Bossier, MD  Procedure(s) Performed: Left knee polyethylene exchange  (Left Knee) EXCISION RIGHT THIGH SOFT TISSUE MASS (Right Thigh)     Patient location during evaluation: PACU Anesthesia Type: Regional, Spinal and MAC Level of consciousness: oriented and awake and alert Pain management: pain level controlled Vital Signs Assessment: post-procedure vital signs reviewed and stable Respiratory status: spontaneous breathing, respiratory function stable and patient connected to nasal cannula oxygen Cardiovascular status: blood pressure returned to baseline and stable Postop Assessment: no headache, no backache and no apparent nausea or vomiting Anesthetic complications: no    Last Vitals:  Vitals:   04/21/18 1715 04/21/18 1926  BP: (!) 137/97 135/72  Pulse: (!) 50 76  Resp: 18 17  Temp: (!) 36.3 C 36.5 C  SpO2: 99% 99%    Last Pain:  Vitals:   04/21/18 1926  TempSrc: Oral  PainSc:                  [place]

## 2018-04-21 NOTE — Anesthesia Procedure Notes (Signed)
Anesthesia Regional Block: Adductor canal block   Pre-Anesthetic Checklist: ,, timeout performed, Correct Patient, Correct Site, Correct Laterality, Correct Procedure, Correct Position, site marked, Risks and benefits discussed,  Surgical consent,  Pre-op evaluation,  At surgeon's request and post-op pain management  Laterality: Left  Prep: chloraprep       Needles:  Injection technique: Single-shot  Needle Type: Echogenic Needle     Needle Length: 9cm  Needle Gauge: 21     Additional Needles:   Narrative:  Start time: 04/21/2018 1:20 PM End time: 04/21/2018 1:25 PM Injection made incrementally with aspirations every 5 mL.  Performed by: Personally  Anesthesiologist: Albertha Ghee, MD  Additional Notes: Pt tolerated the procedure well.

## 2018-04-21 NOTE — Anesthesia Procedure Notes (Signed)
Date/Time: 04/21/2018 2:30 PM Performed by: Claudia Desanctis, CRNA Oxygen Delivery Method: Circle system utilized

## 2018-04-21 NOTE — Interval H&P Note (Signed)
History and Physical Interval Note:  04/21/2018 12:24 PM  [name], MD  has presented today for surgery, with the diagnosis of Unstable left total knee arthroplasty  The various methods of treatment have been discussed with the patient and family. After consideration of risks, benefits and other options for treatment, the patient has consented to  Procedure(s): Left knee polyethylene versus total knee arthroplasty revision/EXCISION RIGHT THIGH SOFT TISSUE MASS (Left) EXCISION RIGHT THIGH SOFT TISSUE MASS (Right) as a surgical intervention .  The patient's history has been reviewed, patient examined, no change in status, stable for surgery.  I have reviewed the patient's chart and labs.  Questions were answered to the patient's satisfaction.     Pilar Plate Melessa Cowell

## 2018-04-21 NOTE — Progress Notes (Signed)
Assisted Dr. Marcie Bal with left, ultrasound guided, adductor canal block. Side rails up, monitors on throughout procedure. See vital signs in flow sheet. Tolerated Procedure well.

## 2018-04-21 NOTE — Transfer of Care (Signed)
Immediate Anesthesia Transfer of Care Note  Patient: Daniel Bossier, MD  Procedure(s) Performed: Left knee polyethylene exchange  (Left Knee) EXCISION RIGHT THIGH SOFT TISSUE MASS (Right Thigh)  Patient Location: PACU  Anesthesia Type:Spinal  Level of Consciousness: awake, alert , oriented and patient cooperative  Airway & Oxygen Therapy: Patient Spontanous Breathing and Patient connected to face mask  Post-op Assessment: Report given to RN and Post -op Vital signs reviewed and stable  Post vital signs: Reviewed and stable  Last Vitals:  Vitals Value Taken Time  BP 119/65 04/21/2018  4:13 PM  Temp    Pulse 73 04/21/2018  4:14 PM  Resp 12 04/21/2018  4:14 PM  SpO2 95 % 04/21/2018  4:14 PM  Vitals shown include unvalidated device data.  Last Pain:  Vitals:   04/21/18 1324  TempSrc:   PainSc: 0-No pain      Patients Stated Pain Goal: 2 (20/80/22 3361)  Complications: No apparent anesthesia complications

## 2018-04-21 NOTE — Brief Op Note (Signed)
04/21/2018  4:22 PM  PATIENT:  [name], MD  57 y.o. male  PRE-OPERATIVE DIAGNOSIS:  Unstable left total knee arthroplasty      Soft tissue mass right thigh  POST-OPERATIVE DIAGNOSIS:  Unstable left total knee arthroplasty      Soft tissue mass right thigh PROCEDURE:  Procedure(s): Left knee polyethylene exchange  (Left) EXCISION RIGHT THIGH SOFT TISSUE MASS (Right)  SURGEON:  Surgeon(s) and Role:    [name], MD - Primary  PHYSICIAN ASSISTANT:   ASSISTANTS: Theresa Duty, PA-C   ANESTHESIA:   spinal  EBL:  20 mL   BLOOD ADMINISTERED:none  DRAINS: (Medium) Hemovact drain(s) in the left knee with  Suction Open   LOCAL MEDICATIONS USED:  MARCAINE     SPECIMEN:  Source of Specimen:  Right thigh soft tissue mass  DISPOSITION OF SPECIMEN:  PATHOLOGY  COUNTS:  YES  TOURNIQUET:   Total Tourniquet Time Documented: Thigh (Left) - 24 minutes Total: Thigh (Left) - 24 minutes  Thigh (Right) - 2 minutes Total: Thigh (Right) - 2 minutes   DICTATION: .Other Dictation: Dictation Number [id number]  PLAN OF CARE: Admit to inpatient   PATIENT DISPOSITION:  PACU - hemodynamically stable.

## 2018-04-21 NOTE — Op Note (Signed)
NAME: [name], [name] MEDICAL RECORD RF:[id number] ACCOUNT [id number] DATE OF BIRTH:01-Oct-1961 FACILITY: [facility] LOCATION: [facility] PHYSICIAN:Kallan Merrick Zella Ball, MD  OPERATIVE REPORT  DATE OF PROCEDURE:  04/21/2018  PREOPERATIVE DIAGNOSES: 1.  Unstable left total knee arthroplasty. 2.  Soft tissue mass, right thigh.  POSTOPERATIVE DIAGNOSES: 1.  Unstable left total knee arthroplasty. 2.  Soft tissue mass, right thigh.  PROCEDURES: 1.  Tibial polyethylene revision left knee. 2.  Excision of soft tissue mass, right thigh.  SURGEON:  [name]. Brigitte Soderberg, MD  ASSISTANT:  Theresa Duty, PA-C  ANESTHESIA:  Spinal.  ESTIMATED BLOOD LOSS:  Minimal.  DRAINS:  Hemovac x1 in the left knee.  TOURNIQUET TIME: 1.  Left thigh.  It is 23 minutes at 300 mmHg. 2.  Right thigh, 2 minutes at 300 mmHg.  COMPLICATIONS:  None.  CONDITION:  Stable to recovery.  BRIEF CLINICAL NOTE:  The patient is a 57 year old male who had a left total knee arthroplasty done over a year ago and was doing extremely well initially, but then had a fall at work sustaining a hyperextension, valgus type injury to the knee.  He  developed instability of the knee and has had progressively worsening pain and instability since that time.  Bracing did not help.  He had extensive nonoperative management over an 24-month time span and it was decided at that point that he was not going  to get better with the continuation of nonoperative management.  He had a bone scan which was negative for any loosening.  He presents now for tibial polyethylene versus total knee arthroplasty revision.  DESCRIPTION OF  FINDINGS:  After successful administration of spinal anesthetic, a tourniquet was placed high on his left thigh and his left lower extremity was prepped and draped in the usual sterile fashion.  His extremity was wrapped in Esmarch knee  flexed and tourniquet inflated to 300 mmHg.  Midline incision was made with a 10 blade through  the subcutaneous tissue to the level of the extensor mechanism.  A fresh blade was used to make a medial parapatellar arthrotomy.  Soft tissue over the  proximal medial tibia subperiosteally elevate the joint line with a knife and into the semimembranosus bursa with a Cobb elevator.  Soft tissue laterally was elevated with attention being paid to avoiding the patellar tendon on the tibial tubercle.  I  was able to evert the patella.  The tibia would not dislocate from the femur.  I was able to remove the tibial polyethylene which was a 10 mm thickness, size 7 rotating platform, polyethylene for the Attune system.  After removing that, I inspected the  joint,  inspected the interface between the tibial and femoral components and bone and did not see any loosening.  I checked the stability of the implants on the bone and they were both very stable.  We subsequently placed trial inserts starting at 12  mm.  With the 12 there is much better stability, but still a tiny bit of AP play.  I went to a 14 mm thickness, which still allowed for full extension, but with excellent varus, valgus, anterior and posterior balanced throughout full range of motion.   This was felt extremely stable.  The trials were removed.  Then, a permanent size 7,  14 mm rotating platform insert for the Attune system is placed.  The knee is reduced with outstanding stability throughout full range of motion.  Wound was copiously  irrigated with saline solution and the arthrotomy closed  over a Hemovac drain with a running 0 Stratafix suture.  Tourniquet was released with a total time of 23 minutes.  Bleeding was stopped with electrocautery.  The subcu is then closed with  interrupted 2-0 Vicryl and subcuticular running 4-0 Monocryl.  Incisions were cleaned and dried and Steri-Strips applied.  We then wrapped the right lower extremity and Esmarch and inflated the tourniquet to 300 mmHg.  The soft tissue mass is on the distal medial posterior  thigh.  It was about a 4 cm x 4 cm.  A longitudinal incision is made over the longest portion of the  mass.  Skin was cut with subcutaneous tissue.  Upon cutting the skin, I felt the edges of the mass and I was able to deliver the soft tissue mass through the wound just with a gentle manipulation with my finger.  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  It was about 4 x 4 cm in size.  We sent it to pathology for analysis.  We then let the tourniquet down after 2 minutes.  Minor bleeding was identified and stopped with cautery.  Wound was copiously irrigated with saline  solution.  The subcu was then closed with interrupted 2-0 Vicryl and subcuticular running 4-0 Monocryl.  Incision is clean and dried and Steri-Strips applied.  Bulky sterile dressings were applied to both knees.  He was placed into a knee immobilizer on  the left side.    He is subsequently awakened and transported to recovery in stable condition.  AN/NUANCE  D:04/21/2018 T:04/21/2018 JOB:[id number]

## 2018-04-21 NOTE — Anesthesia Preprocedure Evaluation (Signed)
Anesthesia Evaluation  Patient identified by MRN, date of birth, ID band Patient awake    Reviewed: Allergy & Precautions, H&P , NPO status , Patient's Chart, lab work & pertinent test results  Airway Mallampati: II   Neck ROM: full    Dental   Pulmonary neg pulmonary ROS,    breath sounds clear to auscultation       Cardiovascular + Peripheral Vascular Disease   Rhythm:regular Rate:Normal     Neuro/Psych  Headaches, PSYCHIATRIC DISORDERS Anxiety    GI/Hepatic   Endo/Other  diabetes, Type 2  Renal/GU      Musculoskeletal  (+) Arthritis ,   Abdominal   Peds  Hematology   Anesthesia Other Findings   Reproductive/Obstetrics                             Anesthesia Physical Anesthesia Plan  ASA: II  Anesthesia Plan: Spinal, Regional and MAC   Post-op Pain Management:  Regional for Post-op pain   Induction: Intravenous  PONV Risk Score and Plan: 1 and Ondansetron, Dexamethasone, Propofol infusion, Midazolam and Treatment may vary due to age or medical condition  Airway Management Planned: Simple Face Mask  Additional Equipment:   Intra-op Plan:   Post-operative Plan:   Informed Consent: I have reviewed the patients History and Physical, chart, labs and discussed the procedure including the risks, benefits and alternatives for the proposed anesthesia with the patient or authorized representative who has indicated his/her understanding and acceptance.     Plan Discussed with: CRNA, Anesthesiologist and Surgeon  Anesthesia Plan Comments:         Anesthesia Quick Evaluation

## 2018-04-22 ENCOUNTER — Encounter (HOSPITAL_COMMUNITY): Payer: Self-pay | Admitting: Orthopedic Surgery

## 2018-04-22 LAB — BASIC METABOLIC PANEL
Anion gap: 6 (ref 5–15)
BUN: 14 mg/dL (ref 6–20)
CO2: 25 mmol/L (ref 22–32)
Calcium: 8.7 mg/dL — ABNORMAL LOW (ref 8.9–10.3)
Chloride: 110 mmol/L (ref 98–111)
Creatinine, Ser: 0.84 mg/dL (ref 0.61–1.24)
GFR calc Af Amer: >60 mL/min (ref >60)
GFR calc non Af Amer: >60 mL/min (ref >60)
Glucose, Bld: 151 mg/dL — ABNORMAL HIGH (ref 70–99)
Potassium: 4.4 mmol/L (ref 3.5–5.1)
Sodium: 141 mmol/L (ref 135–145)

## 2018-04-22 LAB — CBC
HCT: 41.7 % (ref 39.0–52.0)
Hemoglobin: 13.9 g/dL (ref 13.0–17.0)
MCH: 31 pg (ref 26.0–34.0)
MCHC: 33.3 g/dL (ref 30.0–36.0)
MCV: 92.9 fL (ref 78.0–100.0)
Platelets: 410 10*3/uL — ABNORMAL HIGH (ref 150–400)
RBC: 4.49 MIL/uL (ref 4.22–5.81)
RDW: 14 % (ref 11.5–15.5)
WBC: 16.5 10*3/uL — ABNORMAL HIGH (ref 4.0–10.5)

## 2018-04-22 MED ORDER — RIVAROXABAN 10 MG PO TABS
10.0000 mg | ORAL_TABLET | Freq: Every day | ORAL | Status: DC
  Administered 2018-04-22: 10 mg via ORAL
  Filled 2018-04-22: qty 1

## 2018-04-22 MED ORDER — TRAMADOL HCL 50 MG PO TABS: 50.0000 mg | ORAL_TABLET | Freq: Four times a day (QID) | ORAL | 0 refills | Status: DC | PRN

## 2018-04-22 MED ORDER — METHOCARBAMOL 500 MG PO TABS: 500.0000 mg | ORAL_TABLET | Freq: Four times a day (QID) | ORAL | 0 refills | Status: DC | PRN

## 2018-04-22 MED ORDER — RIVAROXABAN 10 MG PO TABS: 10.0000 mg | ORAL_TABLET | Freq: Every day | ORAL | 0 refills | Status: DC

## 2018-04-22 MED ORDER — ASPIRIN 325 MG PO TBEC: 325.0000 mg | DELAYED_RELEASE_TABLET | Freq: Two times a day (BID) | ORAL | 0 refills | Status: DC

## 2018-04-22 MED FILL — XARELTO 10 MG TABLET: 10 | 20 days supply | Qty: 20 | Fill #0

## 2018-04-22 MED FILL — traMADol HCL 50 MG TABS: 50 | 5 days supply | Qty: 40 | Fill #0

## 2018-04-22 NOTE — Evaluation (Signed)
Physical Therapy Evaluation Patient Details Name: Daniel SHIMABUKURO, MD MRN: [id number] DOB: Aug 24, 1961 Today's Date: 04/22/2018   History of Present Illness   s/p L knee poly exchange, excision of mass R thigh  Clinical Impression  Pt progressing well; anxious to d/c today; pt recalls and verbalizes stair technique from last knee surgery, declines practice, given handout for stairs and HEP; cautioned pt no to overdo it, wife concurs; no further needs this venue;    Follow Up Recommendations Outpatient PT    Equipment Recommendations  None recommended by PT    Recommendations for Other Services       Precautions / Restrictions Precautions Precautions: Knee Restrictions Weight Bearing Restrictions: No Other Position/Activity Restrictions: WBAT      Mobility  Bed Mobility Overal bed mobility: Modified Independent                Transfers Overall transfer level: Needs assistance Equipment used: Rolling walker (2 wheeled) Transfers: Sit to/from Stand Sit to Stand: Supervision         General transfer comment: for safety  Ambulation/Gait Ambulation/Gait assistance: Supervision Gait Distance (Feet): 100 Feet Assistive device: Rolling walker (2 wheeled) Gait Pattern/deviations: Step-to pattern;Step-through pattern;Decreased weight shift to right     General Gait Details: cues for sequence and RW safety  Stairs            Wheelchair Mobility    Modified Rankin (Stroke Patients Only)       Balance Overall balance assessment: Needs assistance;History of Falls           Standing balance-Leahy Scale: Fair                               Pertinent Vitals/Pain Pain Assessment: 0-10 Pain Score: 4  Pain Descriptors / Indicators: Sore;Grimacing;Guarding Pain Intervention(s): Limited activity within patient's tolerance;Monitored during session;Premedicated before session    Home Living Family/patient expects to be discharged to:: Private  residence Living Arrangements: Spouse/significant other Available Help at Discharge: Family;Available PRN/intermittently Type of Home: House Home Access: Stairs to enter   Entrance Stairs-Number of Steps: 4 Home Layout: Two level;Able to live on main level with bedroom/bathroom Home Equipment: Gilford Rile - 2 wheels;Cane - single point;Bedside commode      Prior Function Level of Independence: Independent               Hand Dominance        Extremity/Trunk Assessment   Upper Extremity Assessment Upper Extremity Assessment: Overall WFL for tasks assessed    Lower Extremity Assessment Lower Extremity Assessment: LLE deficits/detail LLE Deficits / Details: AROM knee grossly  -5* to 105* flexion; strength 3+ to 4/5       Communication   Communication: No difficulties  Cognition Arousal/Alertness: Awake/alert Behavior During Therapy: WFL for tasks assessed/performed Overall Cognitive Status: Within Functional Limits for tasks assessed                                        General Comments      Exercises Total Joint Exercises Ankle Circles/Pumps: AROM;Both;10 reps Quad Sets: AROM;Both;15 reps Short Arc Quad: AROM;Left;15 reps Heel Slides: AAROM;15 reps;Left Hip ABduction/ADduction: AROM;Left;10 reps Straight Leg Raises: AROM;Left;10 reps Knee Flexion: AROM;Left;5 reps Goniometric ROM: -5* to 105*    Assessment/Plan    PT Assessment All further PT needs can be met in  the next venue of care  PT Problem List         PT Treatment Interventions      PT Goals (Current goals can be found in the Care Plan section)  Acute Rehab PT Goals Patient Stated Goal: home asap PT Goal Formulation: All assessment and education complete, DC therapy    Frequency     Barriers to discharge        Co-evaluation               AM-PAC PT "6 Clicks" Daily Activity  Outcome Measure Difficulty turning over in bed (including adjusting bedclothes, sheets  and blankets)?: A Little Difficulty moving from lying on back to sitting on the side of the bed? : A Little Difficulty sitting down on and standing up from a chair with arms (e.g., wheelchair, bedside commode, etc,.)?: A Little Help needed moving to and from a bed to chair (including a wheelchair)?: A Little Help needed walking in hospital room?: A Little Help needed climbing 3-5 steps with a railing? : A Little 6 Click Score: 18    End of Session Equipment Utilized During Treatment: Gait belt Activity Tolerance: Patient tolerated treatment well Patient left: in chair;with call bell/phone within reach;with family/visitor present   PT Visit Diagnosis: Difficulty in walking, not elsewhere classified (R26.2)    Time: 0930-1006 PT Time Calculation (min) (ACUTE ONLY): 36 min   Charges:   PT Evaluation $PT Eval Low Complexity: 1 Low PT Treatments $Therapeutic Exercise: 8-22 mins   PT G CodesKenyon Ana, PT Pager: [phone] 04/22/2018   [facility] 04/22/2018, 10:47 AM

## 2018-04-22 NOTE — Progress Notes (Signed)
Spoke with patient at bedside. Confirmed plan for OP PT, already arranged. Has RW and declines 3n1. [phone]

## 2018-04-22 NOTE — Progress Notes (Addendum)
Subjective: 1 Day Post-Op Procedure(s) (LRB): Left knee polyethylene exchange  (Left) EXCISION RIGHT THIGH SOFT TISSUE MASS (Right) Patient reports pain as mild.   Patient seen in rounds by Dr. Wynelle Link. Patient is well, and has had no acute complaints or problems. States he is ready to go home today. Foley catheter removed this AM. Denies chest pain, SOB, or calf pain. We will start therapy today.   Objective: Vital signs in last 24 hours: Temp:  [97.4 F (36.3 C)-99.3 F (37.4 C)] 97.6 F (36.4 C) (07/11 0636) Pulse Rate:  [50-95] 90 (07/11 0636) Resp:  [11-20] 16 (07/11 0222) BP: (119-157)/(65-97) 138/84 (07/11 0636) SpO2:  [93 %-100 %] 95 % (07/11 0636) Weight:  [131.5 kg (290 lb)-131.6 kg (290 lb 2 oz)] 131.5 kg (290 lb) (07/10 1715)  Intake/Output from previous day:  Intake/Output Summary (Last 24 hours) at 04/22/2018 0722 Last data filed at 04/22/2018 0600 Gross per 24 hour  Intake 3469.58 ml  Output 2342.5 ml  Net 1127.08 ml     Labs: Recent Labs    04/22/18 0548  HGB 13.9   Recent Labs    04/22/18 0548  WBC 16.5*  RBC 4.49  HCT 41.7  PLT 410*   Recent Labs    04/22/18 0548  NA 141  K 4.4  CL 110  CO2 25  BUN 14  CREATININE 0.84  GLUCOSE 151*  CALCIUM 8.7*   Exam: General - Patient is Alert and Oriented Extremity - Neurologically intact Neurovascular intact Sensation intact distally Dorsiflexion/Plantar flexion intact Dressing - dressing C/D/I Motor Function - intact, moving foot and toes well on exam.   Past Medical History:  Diagnosis Date  . Anxiety   . Aortic atherosclerosis ([place])   . Arthritis   . BPH (benign prostatic hyperplasia)   . Bronchitis   . Chronic constipation   . DDD (degenerative disc disease), cervical   . Diabetes mellitus without complication ([place])    type 2  . Diverticulosis   . DJD (degenerative joint disease)   . Ganglion cyst    12 mm  . History of blood transfusion   . Hyperlipidemia   . Internal  hemorrhoids   . Low back pain   . Migraines   . Neck pain   . Obesity   . Pneumonia   . Positive QuantiFERON-TB Gold test   . Shoulder pain   . Sinusitis     Assessment/Plan: 1 Day Post-Op Procedure(s) (LRB): Left knee polyethylene exchange  (Left) EXCISION RIGHT THIGH SOFT TISSUE MASS (Right) Principal Problem:   Failed total knee arthroplasty (HCC)  Estimated body mass index is 39.33 kg/m as calculated from the following:   Height as of this encounter: 6' (1.829 m).   Weight as of this encounter: 131.5 kg (290 lb). Advance diet Up with therapy D/C IV fluids  Anticipated LOS equal to or greater than 2 midnights due to - Age 15 and older with one or more of the following:  - Obesity  - Expected need for hospital services (PT, OT, Nursing) required for safe  discharge  - Anticipated need for postoperative skilled nursing care or inpatient rehab  - Active co-morbidities: Diabetes OR   - Unanticipated findings during/Post Surgery: None  - Patient is a high risk of re-admission due to: None    DVT Prophylaxis - Xarelto Weight bearing as tolerated. D/C O2 and pulse ox and try on room air. Hemovac pulled without difficulty, will begin therapy today.  Plan is to  go Home after hospital stay. Plan for discharge today if meeting goals and stable. Scheduled for outpatient physical therapy at [facility] in [place]. Follow-up in the office in 2 weeks with Dr. Wynelle Link.  Theresa Duty, PA-C Orthopedic Surgery 04/22/2018, 7:22 AM

## 2018-04-22 NOTE — Discharge Summary (Signed)
Physician Discharge Summary   Patient ID: Daniel HAYCRAFT, [name] MRN: [id number] DOB/AGE: Feb 23, 1961 57 y.o.  Admit date: 04/21/2018 Discharge date: 04/22/2018  Primary Diagnosis: 1. Unstable left total knee arthroplasty 2. Soft tissue mass, right thigh   Admission Diagnoses:  Past Medical History:  Diagnosis Date  . Anxiety   . Aortic atherosclerosis ([place])   . Arthritis   . BPH (benign prostatic hyperplasia)   . Bronchitis   . Chronic constipation   . DDD (degenerative disc disease), cervical   . Diabetes mellitus without complication ([place])    type 2  . Diverticulosis   . DJD (degenerative joint disease)   . Ganglion cyst    12 mm  . History of blood transfusion   . Hyperlipidemia   . Internal hemorrhoids   . Low back pain   . Migraines   . Neck pain   . Obesity   . Pneumonia   . Positive QuantiFERON-TB Gold test   . Shoulder pain   . Sinusitis    Discharge Diagnoses:   Principal Problem:   Failed total knee arthroplasty ([name])  Estimated body mass index is 39.33 kg/m as calculated from the following:   Height as of this encounter: 6' (1.829 m).   Weight as of this encounter: 131.5 kg (290 lb).  Procedure:  Procedure(s) (LRB): Left knee polyethylene exchange  (Left) EXCISION RIGHT THIGH SOFT TISSUE MASS (Right)   Consults: None  HPI: The patient is a 57 year old male who had a left total knee arthroplasty done over a year ago and was doing extremely well initially, but then had a fall at work sustaining a hyperextension, valgus type injury to the knee.  He developed instability of the knee and has had progressively worsening pain and instability since that time.  Bracing did not help.  He had extensive nonoperative management over an 82-monthtime span and it was decided at that point that he was not going to get better with the continuation of nonoperative management.  He had a bone scan which was negative for any loosening.  He presents now for tibial polyethylene  versus total knee arthroplasty revision.    Laboratory Data: Admission on 04/21/2018, Discharged on 04/22/2018  Component Date Value Ref Range Status  . Glucose-Capillary 04/21/2018 91  70 - 99 mg/dL Final  . Glucose-Capillary 04/21/2018 78  70 - 99 mg/dL Final  . WBC 04/22/2018 16.5* 4.0 - 10.5 K/uL Final  . RBC 04/22/2018 4.49  4.22 - 5.81 MIL/uL Final  . Hemoglobin 04/22/2018 13.9  13.0 - 17.0 g/dL Final  . HCT 04/22/2018 41.7  39.0 - 52.0 % Final  . MCV 04/22/2018 92.9  78.0 - 100.0 fL Final  . MCH 04/22/2018 31.0  26.0 - 34.0 pg Final  . MCHC 04/22/2018 33.3  30.0 - 36.0 g/dL Final  . RDW 04/22/2018 14.0  11.5 - 15.5 % Final  . Platelets 04/22/2018 410* 150 - 400 K/uL Final   [facility] [address], [place] [address] . Sodium 04/22/2018 141  135 - 145 mmol/L Final  . Potassium 04/22/2018 4.4  3.5 - 5.1 mmol/L Final  . Chloride 04/22/2018 110  98 - 111 mmol/L Final   Please note change in reference range.  . CO2 04/22/2018 25  22 - 32 mmol/L Final  . Glucose, Bld 04/22/2018 151* 70 - 99 mg/dL Final   Please note change in reference range.  . BUN 04/22/2018 14  6 - 20  mg/dL Final   Please note change in reference range.  . Creatinine, Ser 04/22/2018 0.84  0.61 - 1.24 mg/dL Final  . Calcium 04/22/2018 8.7* 8.9 - 10.3 mg/dL Final  . GFR calc non Af Amer 04/22/2018 >60  >60 mL/min Final  . GFR calc Af Amer 04/22/2018 >60  >60 mL/min Final   Comment: (NOTE) The eGFR has been calculated using the CKD EPI equation. This calculation has not been validated in all clinical situations. eGFR's persistently <60 mL/min signify possible Chronic Kidney Disease.   Georgiann Hahn gap 04/22/2018 6  5 - 15 Final   Performed at [facility], [place] [address], [place], [place] [address]  Hospital Outpatient Visit on 04/13/2018  Component Date Value Ref Range Status  . aPTT 04/13/2018 30  24 - 36 seconds Final   Performed at [facility], [place] [address], [place], [place] [address]  . WBC 04/13/2018 7.5  4.0 - 10.5 K/uL Final  . RBC 04/13/2018 4.47  4.22 - 5.81 MIL/uL Final  . Hemoglobin 04/13/2018 14.2  13.0 - 17.0 g/dL Final  . HCT 04/13/2018 42.0  39.0 - 52.0 % Final  . MCV 04/13/2018 94.0  78.0 - 100.0 fL Final  . MCH 04/13/2018 31.8  26.0 - 34.0 pg Final  . MCHC 04/13/2018 33.8  30.0 - 36.0 g/dL Final  . RDW 04/13/2018 14.1  11.5 - 15.5 % Final  . Platelets 04/13/2018 350  150 - 400 K/uL Final   Performed at [facility], [place] [address], [place], [place] [address]  . Sodium 04/13/2018 142  135 - 145 mmol/L Final  . Potassium 04/13/2018 4.3  3.5 - 5.1 mmol/L Final  . Chloride 04/13/2018 112* 98 - 111 mmol/L Final   Please note change in reference range.  . CO2 04/13/2018 25  22 - 32 mmol/L Final  . Glucose, Bld 04/13/2018 122* 70 - 99 mg/dL Final   Please note change in reference range.  . BUN 04/13/2018 14  6 - 20 mg/dL Final   Please note change in reference range.  . Creatinine, Ser 04/13/2018 0.91  0.61 - 1.24 mg/dL Final  . Calcium 04/13/2018 8.6* 8.9 - 10.3 mg/dL Final  . Total Protein 04/13/2018 6.8  6.5 - 8.1 g/dL Final  . Albumin 04/13/2018 3.7  3.5 - 5.0 g/dL Final  . AST 04/13/2018 23  15 - 41 U/L Final  . ALT 04/13/2018 24  0 - 44 U/L Final   Please note change in reference range.  . Alkaline Phosphatase 04/13/2018 58  38 - 126 U/L Final  . Total Bilirubin 04/13/2018 0.6  0.3 - 1.2 mg/dL Final  . GFR calc non Af Amer 04/13/2018 >60  >60 mL/min Final  . GFR calc Af Amer 04/13/2018 >60  >60 mL/min Final   Comment: (NOTE) The eGFR has been calculated using the CKD EPI equation. This calculation has not been validated in all clinical situations. eGFR's persistently <60 mL/min signify possible Chronic Kidney Disease.   Georgiann Hahn gap 04/13/2018 5  5 - 15 Final   Performed at [facility], [place] [address], [place], [place] [address]  .  Prothrombin Time 04/13/2018 12.6  11.4 - 15.2 seconds Final  . INR 04/13/2018 0.96   Final   Performed at [facility], [place] [address], [place], [place] [address]  . ABO/RH(D) 04/13/2018 O NEG   Final  . Antibody Screen 04/13/2018 NEG   Final  .  Sample Expiration 04/13/2018 04/24/2018   Final  . Extend sample reason 04/13/2018    Final                   Value:NO TRANSFUSIONS OR PREGNANCY IN THE PAST 3 MONTHS Performed at [facility], [place] [address], [place], [place] [address]   . Color, Urine 04/13/2018 YELLOW  YELLOW Final  . APPearance 04/13/2018 CLEAR  CLEAR Final  . Specific Gravity, Urine 04/13/2018 1.023  1.005 - 1.030 Final  . pH 04/13/2018 7.0  5.0 - 8.0 Final  . Glucose, UA 04/13/2018 NEGATIVE  NEGATIVE mg/dL Final  . Hgb urine dipstick 04/13/2018 NEGATIVE  NEGATIVE Final  . Bilirubin Urine 04/13/2018 NEGATIVE  NEGATIVE Final  . Ketones, ur 04/13/2018 NEGATIVE  NEGATIVE mg/dL Final  . Protein, ur 04/13/2018 NEGATIVE  NEGATIVE mg/dL Final  . Nitrite 04/13/2018 NEGATIVE  NEGATIVE Final  . Leukocytes, UA 04/13/2018 NEGATIVE  NEGATIVE Final   Performed at [place] [address], [place], [place] [address]  . MRSA, PCR 04/13/2018 NEGATIVE  NEGATIVE Final  . Staphylococcus aureus 04/13/2018 NEGATIVE  NEGATIVE Final   Comment: (NOTE) The Xpert SA Assay (FDA approved for NASAL specimens in patients 47 years of age and older), is one component of a comprehensive surveillance program. It is not intended to diagnose infection nor to guide or monitor treatment. Performed at [facility], [place] [address], [place], [place] [address]   . Hgb A1c MFr Bld 04/13/2018 5.4  4.8 - 5.6 % Final   Comment: (NOTE) Pre diabetes:          5.7%-6.4% Diabetes:              >6.4% Glycemic control for   <7.0% adults with diabetes   . Mean Plasma Glucose 04/13/2018 108.28  mg/dL Final   Performed at [place] [address], [place],  [address]  . Glucose-Capillary 04/13/2018 120* 70 - 99 mg/dL Final     X-Rays:No results found.  EKG: Orders placed or performed during the hospital encounter of 04/13/18  . EKG 12 lead  . EKG 12 lead     Hospital Course: Daniel Bossier, [name] is a 57 y.o. who was admitted to [facility]. They were brought to the operating room on 04/21/2018 and underwent Procedure(s): Left knee polyethylene exchange  EXCISION RIGHT THIGH SOFT TISSUE MASS.  Patient tolerated the procedure well and was later transferred to the recovery room and then to the orthopaedic floor for postoperative care.  They were given PO and IV analgesics for pain control following their surgery.  They were given 24 hours of postoperative antibiotics of  Anti-infectives (From admission, onward)   Start     Dose/Rate Route Frequency Ordered Stop   04/21/18 2000  ceFAZolin (ANCEF) IVPB 2g/100 mL premix     2 g 200 mL/hr over 30 Minutes Intravenous Every 6 hours 04/21/18 1728 04/22/18 0250   04/21/18 0600  ceFAZolin (ANCEF) 3 g in dextrose 5 % 50 mL IVPB     3 g 100 mL/hr over 30 Minutes Intravenous On call to O.R. 04/20/18 1314 04/21/18 1427     and started on DVT prophylaxis in the form of Xarelto.   PT and OT were ordered for total joint protocol.  Discharge planning consulted to help with postop disposition and equipment needs.  Patient had a good night on the evening of surgery. Pt was seen during rounds on  POD #1 and he was ready to go home pending progress with therapy. Hemovac drain was pulled without difficulty. Pt completed one session of therapy and was meeting his goals. He was discharged to home later that day in stable condition. Instructed patient that he may change his surgical dressing on POD #2 and that he may shower on POD #3.  Diet: Diabetic diet Activity:WBAT Follow-up:in 2 weeks with Dr. Wynelle Link Disposition - Home with outpatient physical therapy at [facility] in  [place] Discharged Condition: stable   Discharge Instructions    Call [name] / Call 911   Complete by:  As directed    If you experience chest pain or shortness of breath, CALL 911 and be transported to the hospital emergency room.  If you develope a fever above 101 F, pus (white drainage) or increased drainage or redness at the wound, or calf pain, call your surgeon's office.   Change dressing   Complete by:  As directed    Change dressing on Friday (04/23/18), then change the dressing daily with sterile 4 x 4 inch gauze dressing and apply TED hose.  You may clean the incision with alcohol prior to redressing.   Constipation Prevention   Complete by:  As directed    Drink plenty of fluids.  Prune juice may be helpful.  You may use a stool softener, such as Colace (over the counter) 100 mg twice a day.  Use MiraLax (over the counter) for constipation as needed.   Diet - low sodium heart healthy   Complete by:  As directed    Discharge instructions   Complete by:  As directed    Dr. [name] Total Joint Specialist [facility] [address], [place], [place] [address] [phone]  Knee Rehabilitation, Guidelines Following Surgery  Results after knee surgery are often greatly improved when you follow the exercise, range of motion and muscle strengthening exercises prescribed by your doctor. Safety measures are also important to protect the knee from further injury. Any time any of these exercises cause you to have increased pain or swelling in your knee joint, decrease the amount until you are comfortable again and slowly increase them. If you have problems or questions, call your caregiver or physical therapist for advice.   HOME CARE INSTRUCTIONS  Remove items at home which could result in a fall. This includes throw rugs or furniture in walking pathways.  ICE to the affected knee every three hours for 30 minutes at a time and then as needed for pain and swelling.   Continue to use ice on the knee for pain and swelling from surgery. You may notice swelling that will progress down to the foot and ankle.  This is normal after surgery.  Elevate the leg when you are not up walking on it.   Continue to use the breathing machine which will help keep your temperature down.  It is common for your temperature to cycle up and down following surgery, especially at night when you are not up moving around and exerting yourself.  The breathing machine keeps your lungs expanded and your temperature down. Do not place pillow under knee, focus on keeping the knee straight while resting   DIET You may resume your previous home diet once your are discharged from the hospital.  DRESSING / WOUND CARE / SHOWERING You may shower 3 days after surgery, but keep the wounds dry during showering.  You may use an occlusive plastic wrap (Press'n  Seal for example), NO SOAKING/SUBMERGING IN THE BATHTUB.  If the bandage gets wet, change with a clean dry gauze.  If the incision gets wet, pat the wound dry with a clean towel. You may start showering once you are discharged home but do not submerge the incision under water. Just pat the incision dry and apply a dry gauze dressing on daily. Change the surgical dressing daily and reapply a dry dressing each time.  ACTIVITY Walk with your walker as instructed. Use walker as long as suggested by your caregivers. Avoid periods of inactivity such as sitting longer than an hour when not asleep. This helps prevent blood clots.  You may resume a sexual relationship in one month or when given the OK by your doctor.  You may return to work once you are cleared by your doctor.  Do not drive a car for 6 weeks or until released by you surgeon.  Do not drive while taking narcotics.  WEIGHT BEARING Weight bearing as tolerated with assist device (walker, cane, etc) as directed, use it as long as suggested by your surgeon or therapist, typically at least 4-6  weeks.  POSTOPERATIVE CONSTIPATION PROTOCOL Constipation - defined medically as fewer than three stools per week and severe constipation as less than one stool per week.  One of the most common issues patients have following surgery is constipation.  Even if you have a regular bowel pattern at home, your normal regimen is likely to be disrupted due to multiple reasons following surgery.  Combination of anesthesia, postoperative narcotics, change in appetite and fluid intake all can affect your bowels.  In order to avoid complications following surgery, here are some recommendations in order to help you during your recovery period.  Colace (docusate) - Pick up an over-the-counter form of Colace or another stool softener and take twice a day as long as you are requiring postoperative pain medications.  Take with a full glass of water daily.  If you experience loose stools or diarrhea, hold the colace until you stool forms back up.  If your symptoms do not get better within 1 week or if they get worse, check with your doctor.  Dulcolax (bisacodyl) - Pick up over-the-counter and take as directed by the product packaging as needed to assist with the movement of your bowels.  Take with a full glass of water.  Use this product as needed if not relieved by Colace only.   MiraLax (polyethylene glycol) - Pick up over-the-counter to have on hand.  MiraLax is a solution that will increase the amount of water in your bowels to assist with bowel movements.  Take as directed and can mix with a glass of water, juice, soda, coffee, or tea.  Take if you go more than two days without a movement. Do not use MiraLax more than once per day. Call your doctor if you are still constipated or irregular after using this medication for 7 days in a row.  If you continue to have problems with postoperative constipation, please contact the office for further assistance and recommendations.  If you experience "the worst abdominal  pain ever" or develop nausea or vomiting, please contact the office immediatly for further recommendations for treatment.  ITCHING  If you experience itching with your medications, try taking only a single pain pill, or even half a pain pill at a time.  You can also use Benadryl over the counter for itching or also to help with sleep.   TED HOSE  STOCKINGS Wear the elastic stockings on both legs for three weeks following surgery during the day but you may remove then at night for sleeping.  MEDICATIONS See your medication summary on the "After Visit Summary" that the nursing staff will review with you prior to discharge.  You may have some home medications which will be placed on hold until you complete the course of blood thinner medication.  It is important for you to complete the blood thinner medication as prescribed by your surgeon.  Continue your approved medications as instructed at time of discharge.  PRECAUTIONS If you experience chest pain or shortness of breath - call 911 immediately for transfer to the hospital emergency department.  If you develop a fever greater that 101 F, purulent drainage from wound, increased redness or drainage from wound, foul odor from the wound/dressing, or calf pain - CONTACT YOUR SURGEON.                                                   FOLLOW-UP APPOINTMENTS Make sure you keep all of your appointments after your operation with your surgeon and caregivers. You should call the office at the above phone number and make an appointment for approximately two weeks after the date of your surgery or on the date instructed by your surgeon outlined in the "After Visit Summary".   RANGE OF MOTION AND STRENGTHENING EXERCISES  Rehabilitation of the knee is important following a knee injury or an operation. After just a few days of immobilization, the muscles of the thigh which control the knee become weakened and shrink (atrophy). Knee exercises are designed to build  up the tone and strength of the thigh muscles and to improve knee motion. Often times heat used for twenty to thirty minutes before working out will loosen up your tissues and help with improving the range of motion but do not use heat for the first two weeks following surgery. These exercises can be done on a training (exercise) mat, on the floor, on a table or on a bed. Use what ever works the best and is most comfortable for you Knee exercises include:  Leg Lifts - While your knee is still immobilized in a splint or cast, you can do straight leg raises. Lift the leg to 60 degrees, hold for 3 sec, and slowly lower the leg. Repeat 10-20 times 2-3 times daily. Perform this exercise against resistance later as your knee gets better.  Quad and Hamstring Sets - Tighten up the muscle on the front of the thigh (Quad) and hold for 5-10 sec. Repeat this 10-20 times hourly. Hamstring sets are done by pushing the foot backward against an object and holding for 5-10 sec. Repeat as with quad sets.  Leg Slides: Lying on your back, slowly slide your foot toward your buttocks, bending your knee up off the floor (only go as far as is comfortable). Then slowly slide your foot back down until your leg is flat on the floor again. Angel Wings: Lying on your back spread your legs to the side as far apart as you can without causing discomfort.  A rehabilitation program following serious knee injuries can speed recovery and prevent re-injury in the future due to weakened muscles. Contact your doctor or a physical therapist for more information on knee rehabilitation.   IF YOU ARE TRANSFERRED  TO A SKILLED REHAB FACILITY If the patient is transferred to a skilled rehab facility following release from the hospital, a list of the current medications will be sent to the facility for the patient to continue.  When discharged from the skilled rehab facility, please have the facility set up the patient's [place]  prior to being released. Also, the skilled facility will be responsible for providing the patient with their medications at time of release from the facility to include their pain medication, the muscle relaxants, and their blood thinner medication. If the patient is still at the rehab facility at time of the two week follow up appointment, the skilled rehab facility will also need to assist the patient in arranging follow up appointment in our office and any transportation needs.  MAKE SURE YOU:  Understand these instructions.  Get help right away if you are not doing well or get worse.    Pick up stool softner and laxative for home use following surgery while on pain medications. Do not submerge incision under water. Please use good hand washing techniques while changing dressing each day. May shower starting three days after surgery. Please use a clean towel to pat the incision dry following showers. Continue to use ice for pain and swelling after surgery. Do not use any lotions or creams on the incision until instructed by your surgeon.   Do not put a pillow under the knee. Place it under the heel.   Complete by:  As directed    Driving restrictions   Complete by:  As directed    No driving for two weeks   TED hose   Complete by:  As directed    Use stockings (TED hose) for three weeks on both leg(s).  You may remove them at night for sleeping.   Weight bearing as tolerated   Complete by:  As directed      Allergies as of 04/22/2018      Reactions   Benzoin Compound Other (See Comments)   Blisters       Medication List    STOP taking these medications   celecoxib 200 MG capsule Commonly known as:  CELEBREX     TAKE these medications   amoxicillin 500 MG capsule Commonly known as:  AMOXIL TAKE 2000 MG BY MOUTH 1 HOUR PRIOR TO ORAL SURGERY   baclofen 10 MG tablet Commonly known as:  LIORESAL Take 10-20 mg by mouth 2 (two) times daily as needed for muscle spasms.     butalbital-acetaminophen-caffeine 50-325-40 MG tablet Commonly known as:  FIORICET, ESGIC Take 1-2 tablets by mouth every 4 (four) hours as needed for headache.   CIALIS 5 MG tablet Generic drug:  tadalafil Take 5 mg by mouth daily.   cyclobenzaprine 10 MG tablet Commonly known as:  FLEXERIL Take 10 mg by mouth 3 (three) times daily as needed for muscle spasms.   diazepam 5 MG tablet Commonly known as:  VALIUM Take 5 mg by mouth 2 (two) times daily.   fluticasone 50 MCG/ACT nasal spray Commonly known as:  FLONASE Place 2 sprays into both nostrils daily as needed for allergies.   ketoconazole 2 % cream Commonly known as:  NIZORAL Apply 1 application topically 2 (two) times daily as needed for irritation.   lidocaine 5 % Commonly known as:  LIDODERM Place 1 patch onto the skin daily. Remove & Discard patch within 12 hours or as directed by [name]   metFORMIN 1000 MG tablet  Commonly known as:  GLUCOPHAGE Take 1,000 mg by mouth 2 (two) times daily with a meal.   methocarbamol 500 MG tablet Commonly known as:  ROBAXIN Take 1 tablet (500 mg total) by mouth every 6 (six) hours as needed for muscle spasms. What changed:  Another medication with the same name was added. Make sure you understand how and when to take each.   methocarbamol 500 MG tablet Commonly known as:  ROBAXIN Take 1 tablet (500 mg total) by mouth every 6 (six) hours as needed for muscle spasms (use first for muscle spasms). What changed:  You were already taking a medication with the same name, and this prescription was added. Make sure you understand how and when to take each.   OVER THE COUNTER MEDICATION Take 1 capsule by mouth 2 (two) times daily. Super Beta Prostate Supplement   oxyCODONE 5 MG immediate release tablet Commonly known as:  Oxy IR/ROXICODONE Take 5-10 mg by mouth every 8 (eight) hours as needed for severe pain. for pain   oxymetazoline 0.05 % nasal spray Commonly known as:  AFRIN Place 2  sprays into both nostrils 2 (two) times daily as needed for congestion.   pravastatin 40 MG tablet Commonly known as:  PRAVACHOL Take 40 mg by mouth daily.   pseudoephedrine 120 MG 12 hr tablet Commonly known as:  SUDAFED Take 120 mg by mouth daily as needed for congestion.   rivaroxaban 10 MG Tabs tablet Commonly known as:  XARELTO Take 1 tablet (10 mg total) by mouth daily for 20 days. Take one 10 mg Xarelto tablet once a day for 3 weeks following surgery. Then take one 81 mg Aspirin once a day for 3 weeks. Then discontinue Aspirin.   rizatriptan 5 MG disintegrating tablet Commonly known as:  MAXALT-MLT Take 5 mg by mouth as needed for migraine.   SUMAtriptan 50 MG tablet Commonly known as:  IMITREX Take 50 mg by mouth every 2 (two) hours as needed for migraine or headache. May repeat in 2 hours if headache persists or recurs.   tamsulosin 0.4 MG Caps capsule Commonly known as:  FLOMAX Take 0.4 mg by mouth 2 (two) times daily.   topiramate 25 MG tablet Commonly known as:  TOPAMAX Take 25 mg by mouth at bedtime.   traMADol 50 MG tablet Commonly known as:  ULTRAM Take 1-2 tablets (50-100 mg total) by mouth every 6 (six) hours as needed for moderate pain. What changed:  Another medication with the same name was added. Make sure you understand how and when to take each.   traMADol 50 MG tablet Commonly known as:  ULTRAM Take 1-2 tablets (50-100 mg total) by mouth every 6 (six) hours as needed for moderate pain (not relieved by oxycodone). What changed:  You were already taking a medication with the same name, and this prescription was added. Make sure you understand how and when to take each.   zolpidem 10 MG tablet Commonly known as:  AMBIEN Take 10 mg by mouth at bedtime as needed for sleep.            Discharge Care Instructions  (From admission, onward)        Start     Ordered   04/22/18 0000  Weight bearing as tolerated     04/22/18 0730   04/22/18 0000   Change dressing    Comments:  Change dressing on Friday (04/23/18), then change the dressing daily with sterile 4 x 4 inch gauze dressing  and apply TED hose.  You may clean the incision with alcohol prior to redressing.   04/22/18 0730     Follow-up Information    [name], [name] Follow up on 05/04/2018.   Specialty:  Orthopedic Surgery Contact information: [address] [place] [address] [phone]           Signed: Theresa Duty, PA-C Orthopaedic Surgery 04/22/2018, 2:08 PM

## 2018-04-22 NOTE — Discharge Instructions (Signed)
Dr. [name] Total Joint Specialist [facility] [address], [place], [place] [address] [phone]  POLYETHYLENE REVISION POSTOPERATIVE DIRECTIONS  Knee Rehabilitation, Guidelines Following Surgery  Results after knee surgery are often greatly improved when you follow the exercise, range of motion and muscle strengthening exercises prescribed by your doctor. Safety measures are also important to protect the knee from further injury. Any time any of these exercises cause you to have increased pain or swelling in your knee joint, decrease the amount until you are comfortable again and slowly increase them. If you have problems or questions, call your caregiver or physical therapist for advice.   HOME CARE INSTRUCTIONS   Remove items at home which could result in a fall. This includes throw rugs or furniture in walking pathways.   ICE to the affected knee every three hours for 30 minutes at a time and then as needed for pain and swelling.  Continue to use ice on the knee for pain and swelling from surgery. You may notice swelling that will progress down to the foot and ankle.  This is normal after surgery.  Elevate the leg when you are not up walking on it.    Continue to use the breathing machine which will help keep your temperature down.  It is common for your temperature to cycle up and down following surgery, especially at night when you are not up moving around and exerting yourself.  The breathing machine keeps your lungs expanded and your temperature down.  Do not place pillow under knee, focus on keeping the knee straight while resting  DIET You may resume your previous home diet once your are discharged from the hospital.  DRESSING / WOUND CARE / SHOWERING You may shower 3 days after surgery, but keep the wounds dry during showering.  You may use an occlusive plastic wrap (Press'n Seal for example), NO SOAKING/SUBMERGING IN THE BATHTUB.  If the bandage gets  wet, change with a clean dry gauze.  If the incision gets wet, pat the wound dry with a clean towel. You may start showering once you are discharged home but do not submerge the incision under water. Just pat the incision dry and apply a dry gauze dressing on daily. Change the surgical dressing daily and reapply a dry dressing each time.  ACTIVITY Walk with your walker as instructed. Use walker as long as suggested by your caregivers. Avoid periods of inactivity such as sitting longer than an hour when not asleep. This helps prevent blood clots.  You may resume a sexual relationship in one month or when given the OK by your doctor.  You may return to work once you are cleared by your doctor.  Do not drive a car for 6 weeks or until released by you surgeon.  Do not drive while taking narcotics.  WEIGHT BEARING Weight bearing as tolerated with assist device (walker, cane, etc) as directed, use it as long as suggested by your surgeon or therapist, typically at least 4-6 weeks.  POSTOPERATIVE CONSTIPATION PROTOCOL Constipation - defined medically as fewer than three stools per week and severe constipation as less than one stool per week.  One of the most common issues patients have following surgery is constipation.  Even if you have a regular bowel pattern at home, your normal regimen is likely to be disrupted due to multiple reasons following surgery.  Combination of anesthesia, postoperative narcotics, change in appetite and fluid intake all can affect your bowels.  In order to avoid complications following surgery, here are some recommendations in order to help you during your recovery period.  Colace (docusate) - Pick up an over-the-counter form of Colace or another stool softener and take twice a day as long as you are requiring postoperative pain medications.  Take with a full glass of water daily.  If you experience loose stools or diarrhea, hold the colace until you stool forms back up.  If  your symptoms do not get better within 1 week or if they get worse, check with your doctor.  Dulcolax (bisacodyl) - Pick up over-the-counter and take as directed by the product packaging as needed to assist with the movement of your bowels.  Take with a full glass of water.  Use this product as needed if not relieved by Colace only.   MiraLax (polyethylene glycol) - Pick up over-the-counter to have on hand.  MiraLax is a solution that will increase the amount of water in your bowels to assist with bowel movements.  Take as directed and can mix with a glass of water, juice, soda, coffee, or tea.  Take if you go more than two days without a movement. Do not use MiraLax more than once per day. Call your doctor if you are still constipated or irregular after using this medication for 7 days in a row.  If you continue to have problems with postoperative constipation, please contact the office for further assistance and recommendations.  If you experience "the worst abdominal pain ever" or develop nausea or vomiting, please contact the office immediatly for further recommendations for treatment.  ITCHING  If you experience itching with your medications, try taking only a single pain pill, or even half a pain pill at a time.  You can also use Benadryl over the counter for itching or also to help with sleep.   TED HOSE STOCKINGS Wear the elastic stockings on both legs for three weeks following surgery during the day but you may remove then at night for sleeping.  MEDICATIONS See your medication summary on the After Visit Summary that the nursing staff will review with you prior to discharge.  You may have some home medications which will be placed on hold until you complete the course of blood thinner medication.  It is important for you to complete the blood thinner medication as prescribed by your surgeon.  Continue your approved medications as instructed at time of discharge.  Information on my  medicine - XARELTO (Rivaroxaban)  Why was Xarelto prescribed for you? Xarelto was prescribed for you to reduce the risk of blood clots forming after orthopedic surgery. The medical term for these abnormal blood clots is venous thromboembolism (VTE).  What do you need to know about xarelto ? Take your Xarelto ONCE DAILY at the same time every day. You may take it either with or without food.  If you have difficulty swallowing the tablet whole, you may crush it and mix in applesauce just prior to taking your dose.  Take Xarelto exactly as prescribed by your doctor and DO NOT stop taking Xarelto without talking to the doctor who prescribed the medication.  Stopping without other VTE prevention medication to take the place of Xarelto may increase your risk of developing a clot.  After discharge, you should have regular check-up appointments with your healthcare provider that is prescribing your Xarelto.    What do you do if you miss a dose? If you miss a dose, take it as soon  as you remember on the same day then continue your regularly scheduled once daily regimen the next day. Do not take two doses of Xarelto® on the same day.  ° °Important Safety Information °A possible side effect of Xarelto® is bleeding. You should call your healthcare provider right away if you experience any of the following: °? Bleeding from an injury or your nose that does not stop. °? Unusual colored urine (red or dark brown) or unusual colored stools (red or black). °? Unusual bruising for unknown reasons. °? A serious fall or if you hit your head (even if there is no bleeding). ° °Some medicines may interact with Xarelto® and might increase your risk of bleeding while on Xarelto®. To help avoid this, consult your healthcare provider or pharmacist prior to using any new prescription or non-prescription medications, including herbals, vitamins, non-steroidal anti-inflammatory drugs (NSAIDs) and supplements. ° °This  website has more information on Xarelto®: www.xarelto.com. ° ° ° °PRECAUTIONS °If you experience chest pain or shortness of breath - call 911 immediately for transfer to the hospital emergency department.  °If you develop a fever greater that 101 F, purulent drainage from wound, increased redness or drainage from wound, foul odor from the wound/dressing, or calf pain - CONTACT YOUR SURGEON.   °                                                °FOLLOW-UP APPOINTMENTS °Make sure you keep all of your appointments after your operation with your surgeon and caregivers. You should call the office at the above phone number and make an appointment for approximately two weeks after the date of your surgery or on the date instructed by your surgeon outlined in the "After Visit Summary". ° ° °RANGE OF MOTION AND STRENGTHENING EXERCISES  °Rehabilitation of the knee is important following a knee injury or an operation. After just a few days of immobilization, the muscles of the thigh which control the knee become weakened and shrink (atrophy). Knee exercises are designed to build up the tone and strength of the thigh muscles and to improve knee motion. Often times heat used for twenty to thirty minutes before working out will loosen up your tissues and help with improving the range of motion but do not use heat for the first two weeks following surgery. These exercises can be done on a training (exercise) mat, on the floor, on a table or on a bed. Use what ever works the best and is most comfortable for you Knee exercises include:  °• Leg Lifts - While your knee is still immobilized in a splint or cast, you can do straight leg raises. Lift the leg to 60 degrees, hold for 3 sec, and slowly lower the leg. Repeat 10-20 times 2-3 times daily. Perform this exercise against resistance later as your knee gets better.  °• Quad and Hamstring Sets - Tighten up the muscle on the front of the thigh (Quad) and hold for 5-10 sec. Repeat this  10-20 times hourly. Hamstring sets are done by pushing the foot backward against an object and holding for 5-10 sec. Repeat as with quad sets.  °· Leg Slides: Lying on your back, slowly slide your foot toward your buttocks, bending your knee up off the floor (only go as far as is comfortable). Then slowly slide your foot back down until   until your leg is flat on the floor again.  Angel Wings: Lying on your back spread your legs to the side as far apart as you can without causing discomfort.  A rehabilitation program following serious knee injuries can speed recovery and prevent re-injury in the future due to weakened muscles. Contact your doctor or a physical therapist for more information on knee rehabilitation.    MAKE SURE YOU:   Understand these instructions.   Get help right away if you are not doing well or get worse.    Pick up stool softner and laxative for home use following surgery while on pain medications. Do not submerge incision under water. Please use good hand washing techniques while changing dressing each day. May shower starting three days after surgery. Please use a clean towel to pat the incision dry following showers. Continue to use ice for pain and swelling after surgery. Do not use any lotions or creams on the incision until instructed by your surgeon.

## 2018-04-23 ENCOUNTER — Other Ambulatory Visit: Payer: Self-pay | Admitting: *Deleted

## 2018-04-23 NOTE — Patient Outreach (Signed)
[facility]) Care Management  04/23/2018  [name], MD 1960-11-26 [id number]   Subjective: Telephone call to patient's home  / mobile number, no answer, left HIPAA compliant voicemail message, and requested call back.    Objective: Per KPN (Knowledge Performance Now, point of care tool) and chart review, patient hospitalized 04/21/18 -04/22/18 for Unstable left total knee arthroplasty, Soft tissue mass, right thigh, status post Tibial polyethylene revision left knee, and Excision of soft tissue mass, right thigh on 04/21/18.    Patient hospitalized 12/08/16 - 12/10/16 for [place]. Status post LeftTotal Knee Arthroplasty on 12/08/16.   Patient also has a history of  Aortic atherosclerosis, BPH (benign prostatic hyperplasia),  Diabetes, Diverticulosis, DJD (degenerative joint disease),  Ganglion cyst,  Hyperlipidemia, Positive QuantiFERON-TB Gold test, and Shoulder pain.   [facility] Care Management closed case on 12/30/16 due to unable to contact.   [facility] Care Management preoperative call completed on 12/01/16.      Assessment: Received UMR  Preoperative / Transition of care referral on 7/1/9.  Transition of care follow up pending patient contact.      Plan: RNCM will send unsuccessful outreach  letter, [facility] pamphlet, will call patient for 2nd telephone outreach attempt, transition of care follow up, and proceed with case closure, within 10 business days if no return call.       Verdelle Valtierra H. Annia Friendly, BSN, [facility] Telephonic CM Phone: [phone] Fax: [phone]

## 2018-04-26 ENCOUNTER — Other Ambulatory Visit: Payer: Self-pay | Admitting: *Deleted

## 2018-04-26 NOTE — Patient Outreach (Signed)
[facility]) Care Management  04/26/2018  [name], MD [birth year] [id number]   Subjective: Telephone call to patient's home / mobile number, spoke with patient, and HIPAA verified.  Discussed [facility] Care Management UMR ED visit  follow up, patient voiced understanding, and is in agreement to follow up.  Patient states he is doing okay, as well as can be expected, will start outpatient rehab on 04/27/18, and has a follow up appointment with surgeon on 05/04/18.  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 Patient states he is able to manage self care and has assistance as needed with activities of daily living / home management.  Patient voices understanding of medical diagnosis, surgery, and treatment plan. States he is accessing the following [facility] benefits: outpatient pharmacy, hospital indemnity (verbally given contact number for Teena Dunk [phone], will file claim if appropriate, verbally given contact number for [employer] [phone] to request itemized bill), and will call [employer] to update family medical leave act (FMLA) claim status due to recent surgery. Patient states he does not have any education material, transition of care, care coordination, disease management, disease monitoring, transportation, community resource, or pharmacy needs at this time. Discussed diabetes disease management resources for [employer], patient voiced understanding, declined referral, and states diabetes is being managed by primary MD. States he will follow up with [place], regarding Cialis pre-authorization issue,  and verbally given Kim's contact number [phone]).  States he is very appreciative of the follow up and is in agreement to receive [facility] Management information.     Objective: Per KPN (Knowledge Performance Now, point of care tool) and chart review, patient hospitalized 04/21/18 -04/22/18 for Unstable left total knee  arthroplasty, Soft tissue mass, right thigh, status post Tibial polyethylene revision left knee, and Excision of soft tissue mass, right thigh on 04/21/18.    Patient hospitalized 12/08/16 - 12/10/16 for [place]. Status post LeftTotal Knee Arthroplasty on 12/08/16.   Patient also has a history of  Aortic atherosclerosis, BPH (benign prostatic hyperplasia),  Diabetes, Diverticulosis, DJD (degenerative joint disease),  Ganglion cyst,  Hyperlipidemia, Positive QuantiFERON-TB Gold test, and Shoulder pain.   [facility] Care Management closed case on 12/30/16 due to unable to contact. [facility] Care Management preoperative call completed on 12/01/16.      Assessment: Received UMR  Preoperative / Transition of care referral on 7/1/9.  Transition of care follow up completed, no care management needs, and will proceed with case closure.     Plan: RNCM will send patient successful outreach letter, [facility] pamphlet, and magnet. RNCM will complete case closure due to follow up completed / no care management needs.        Gussie Murton H. Annia Friendly, BSN, [facility] Telephonic CM Phone: [phone] Fax: [phone]

## 2018-04-27 ENCOUNTER — Ambulatory Visit: Payer: 59 | Attending: Internal Medicine

## 2018-04-27 DIAGNOSIS — R600 Localized edema: Secondary | ICD-10-CM | POA: Insufficient documentation

## 2018-04-27 DIAGNOSIS — M25562 Pain in left knee: Secondary | ICD-10-CM | POA: Diagnosis not present

## 2018-04-27 DIAGNOSIS — R2689 Other abnormalities of gait and mobility: Secondary | ICD-10-CM | POA: Insufficient documentation

## 2018-04-27 DIAGNOSIS — M25662 Stiffness of left knee, not elsewhere classified: Secondary | ICD-10-CM | POA: Insufficient documentation

## 2018-04-27 DIAGNOSIS — M6281 Muscle weakness (generalized): Secondary | ICD-10-CM | POA: Insufficient documentation

## 2018-04-27 MED FILL — RIZATRIPTAN 5 MG ODT: 5 | 30 days supply | Qty: 12 | Fill #7

## 2018-04-27 MED FILL — TADALAFIL 5 MG TABS: 5 | 30 days supply | Qty: 30 | Fill #2

## 2018-04-27 MED FILL — BUTALB-ACETAMIN-CAFF 50-325: 50-325-40 | 10 days supply | Qty: 60 | Fill #1

## 2018-04-27 MED FILL — TOPIRAMATE 25 MG TAB: 25 | 90 days supply | Qty: 90 | Fill #1

## 2018-04-27 MED FILL — PRAVASTATIN NA 40 MG TAB: 40 | 90 days supply | Qty: 90 | Fill #1

## 2018-04-27 NOTE — Therapy (Signed)
[place], [place], [address] Phone: [phone]   Fax:  [phone]  Physical Therapy Evaluation  Patient Details  Name: Daniel EAKINS, [name] MRN: [id number] Date of Birth: [birth year] Referring Provider: [name], [name]   Encounter Date: 04/27/2018  PT End of Session - 04/27/18 1218    Visit Number  1    Number of Visits  24    Date for PT Re-Evaluation  07/09/18    Authorization Type  [employer]R ,, TRICARE    PT Start Time  1140    PT Stop Time  1230    PT Time Calculation (min)  50 min    Activity Tolerance  Patient tolerated treatment well;No increased pain    Behavior During Therapy  WFL for tasks assessed/performed       Past Medical History:  Diagnosis Date  . Anxiety   . Aortic atherosclerosis ([place])   . Arthritis   . BPH (benign prostatic hyperplasia)   . Bronchitis   . Chronic constipation   . DDD (degenerative disc disease), cervical   . Diabetes mellitus without complication ([place])    type 2  . Diverticulosis   . DJD (degenerative joint disease)   . Ganglion cyst    12 mm  . History of blood transfusion   . Hyperlipidemia   . Internal hemorrhoids   . Low back pain   . Migraines   . Neck pain   . Obesity   . Pneumonia   . Positive QuantiFERON-TB Gold test   . Shoulder pain   . Sinusitis     Past Surgical History:  Procedure Laterality Date  . COLONOSCOPY  2018  . Hip Resurface  2006  . MASS EXCISION Right 04/21/2018   Procedure: EXCISION RIGHT THIGH SOFT TISSUE MASS;  Surgeon: [name], [name];  Location: [facility];  Service: Orthopedics;  Laterality: Right;  . MENISCUS REPAIR  2008, 1993  . SLAP Tear Repair  2008  . TONSILLECTOMY    . TOTAL KNEE ARTHROPLASTY Left 12/08/2016   Procedure: LEFT TOTAL KNEE ARTHROPLASTY;  Surgeon: [name], [name];  [facility]: [facility];  Service: Orthopedics;  Laterality: Left;  . TOTAL KNEE ARTHROPLASTY WITH REVISION COMPONENTS Left 04/21/2018    Procedure: Left knee polyethylene exchange ;  Surgeon: [name], [name];  [facility]: [facility];  Service: Orthopedics;  Laterality: Left;    There were no vitals filed for this visit.   Subjective Assessment - 04/27/18 1146    Subjective  Post lateral thicker spacer to TKA originally done 11/2016. Also resection of mass RT medial thigh near medial hamstring.    Pt fell (07/2018) and strained ligaments so added thichness to make knee more stable.    He had returned to work and was doing normal activity.     Limitations  Sitting;Standing;Walking    How long can you walk comfortably?  household distances    Patient Stated Goals  To return to normal activity, return to work    Pain Score  7     Pain Location  Knee    Pain Orientation  Left;Lateral moove proximal and distal    Pain Descriptors / Indicators  Stabbing;Sharp    Pain Type  Surgical pain    Pain Frequency  Constant         [facility] PT Assessment - 04/27/18 0001      Assessment   Medical Diagnosis  REvieion Lt TKA     Referring Provider  [name], [name]    Onset Date/Surgical Date  04/21/18    Next [name] Visit  05/04/18    Prior Therapy  None post this surgery ,  Pt post original Surgery      Precautions   Precautions  None      Restrictions   Weight Bearing Restrictions  No      Balance Screen   Has the patient fallen in the past 6 months  No      Prior Function   Level of Independence  Requires assistive device for independence;Needs assistance with ADLs;Needs assistance with homemaking    Vocation  Full time employment      Cognition   Overall Cognitive Status  Within Functional Limits for tasks assessed      ROM / Strength   AROM / PROM / Strength  AROM;PROM;Strength      AROM   AROM Assessment Site  Knee    Right/Left Knee  Left;Right    Right Knee Extension  0    Right Knee Flexion  126    Left Knee Extension  20    Left Knee Flexion  97      PROM   PROM Assessment Site  Knee    Right/Left Knee  Left       Strength   Strength Assessment Site  Knee    Right/Left Knee  Right;Left    Right Knee Flexion  5/5    Right Knee Extension  5/5    Left Knee Flexion  5/5    Left Knee Extension  3-/5      Flexibility   Soft Tissue Assessment /Muscle Length  yes      Palpation   Patella mobility  decr , edema noted      Ambulation/Gait   Gait Comments  RW , decr weight to Lt leg                  Objective measurements completed on examination: See above findings.      [facility] Adult PT Treatment/Exercise - 04/27/18 0001      Exercises   Exercises  Knee/Hip      Knee/Hip Exercises: Supine   Quad Sets  Left;10 reps    Short Arc Quad Sets  Left;10 reps    Straight Leg Raises  Left;10 reps      Knee/Hip Exercises: Sidelying   Hip ABduction  Left;10 reps      Modalities   Modalities  Vasopneumatic      Vasopneumatic   Number Minutes Vasopneumatic   15 minutes    Vasopnuematic Location   Knee    Vasopneumatic Pressure  Medium    Vasopneumatic Temperature   32             PT Education - 04/27/18 1217    Education Details  POC , HEP    Person(s) Educated  Patient    Methods  Explanation;Tactile cues;Handout;Verbal cues    Comprehension  Returned demonstration;Verbalized understanding       PT Short Term Goals - 04/27/18 1225      PT SHORT TERM GOAL #1   Title  pt will be I with initial HEP given     Time  4    Period  Weeks    Status  New      PT SHORT TERM GOAL #2   Title  pt will be able to verbalize and demo techniques to prevent/ reduce edema/ pain via RICE and HEP)  Time  4    Period  Weeks    Status  New      PT SHORT TERM GOAL #3   Title  pt will increase L knee flexion to >/= 110 degrees and extension to >/= -5 degrees for functional progression     Time  4    Period  Weeks    Status  New      PT SHORT TERM GOAL #4   Title  pt will be able to stand/ ambulate >/= 15 min with </= 5/10 pain with LRAD to promote functional mobililty (01/12/2017     Time  4    Period  Weeks    Status  New        PT Long Term Goals - 04/27/18 1229      PT LONG TERM GOAL #1   Title  pt will be I with all HEP given as of last visit     Time  12    Period  Weeks    Status  New      PT LONG TERM GOAL #2   Title  pt will increase L knee flexion to >/=125 degrees and extension to >/=-2 degrees with </= 2/10 pain for functional and efficient gait pattern (02/06/2017)    Time  12    Period  Weeks    Status  New      PT LONG TERM GOAL #3   Title  pt will increase L knee strength to >/= 4/5 for prolonged walking/ standing activities to promote safety (02/06/2017)    Time  12    Period  Weeks    Status  New      PT LONG TERM GOAL #4   Title  pt will be able to walk/ stand for >/= 30 minutes and navigate >/= 15 steps with >/=1 HHA for functional endurance and work related activities     Time  12    Period  Weeks    Status  New             Plan - 04/27/18 1222    Clinical Impression Statement  Mr Levinson is post surgery for fall and instability with LT knee.  Previous TKA with return to normal function.  He presents with stiffness of LT knee , pain nad weakness of quads. He is limited with activity on feet and is not able to work. He should return to normal activity post rehab    History and Personal Factors relevant to plan of care:  LT TKA    Clinical Presentation  Stable    Clinical Decision Making  Low    Rehab Potential  Good    PT Frequency  3x / week 1 more visit this week then    PT Duration  2 weeks then 2x/week until 07/09/18    PT Treatment/Interventions  Manual techniques;Patient/family education;Therapeutic activities;Therapeutic exercise;Cryotherapy;Vasopneumatic Device;Passive range of motion;Taping;Functional mobility training;Gait training;Stair training    PT Next Visit Plan  ROM and sttrength to tolerance , gait and balance , vaso    PT Home Exercise Plan  QS,, SAQ, SLR, flex/ext stretching    Consulted and Agree with Plan  of Care  Patient       Patient will benefit from skilled therapeutic intervention in order to improve the following deficits and impairments:  Pain, Decreased activity tolerance, Decreased range of motion, Decreased strength, Difficulty walking, Increased edema  Visit Diagnosis: Localized edema  Stiffness of left knee, not  elsewhere classified  Muscle weakness (generalized)  Other abnormalities of gait and mobility  Acute pain of left knee     Problem List Patient Active Problem List   Diagnosis Date Noted  . Failed total knee arthroplasty ([place]) 04/21/2018  . OA (osteoarthritis) of knee 12/08/2016  . 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 Cervical facet joint syndrome 12/17/2015  . Chronic bilateral low back pain without sciatica 12/17/2015  . Primary osteoarthritis of left knee 12/17/2015    Darrel Hoover  PT 04/27/2018, 12:32 PM  [facility] [address] [place], [place], [address] Phone: [phone]   Fax:  [phone]  Name: Daniel MENDIOLA, [name] MRN: [id number] Date of Birth: Feb 15, 1961

## 2018-04-29 ENCOUNTER — Ambulatory Visit: Payer: 59

## 2018-04-29 DIAGNOSIS — M25662 Stiffness of left knee, not elsewhere classified: Secondary | ICD-10-CM

## 2018-04-29 DIAGNOSIS — M25562 Pain in left knee: Secondary | ICD-10-CM | POA: Diagnosis not present

## 2018-04-29 DIAGNOSIS — M6281 Muscle weakness (generalized): Secondary | ICD-10-CM

## 2018-04-29 DIAGNOSIS — R600 Localized edema: Secondary | ICD-10-CM

## 2018-04-29 DIAGNOSIS — R2689 Other abnormalities of gait and mobility: Secondary | ICD-10-CM | POA: Diagnosis not present

## 2018-04-29 NOTE — Therapy (Signed)
[place], [place], [address] Phone: [phone]   Fax:  [phone]  Physical Therapy Treatment  Patient Details  Name: Daniel LOFSTROM, [name] MRN: [id number] Date of Birth: [birth year] Referring Provider: [name], [name]   Encounter Date: 04/29/2018  PT End of Session - 04/29/18 1140    Visit Number  2    Number of Visits  24    Date for PT Re-Evaluation  07/09/18    Authorization Type  UMR ,, TRICARE    PT Start Time  1140    PT Stop Time  3299    PT Time Calculation (min)  55 min    Activity Tolerance  Patient tolerated treatment well;No increased pain    Behavior During Therapy  WFL for tasks assessed/performed       Past Medical History:  Diagnosis Date  . Anxiety   . Aortic atherosclerosis ([place])   . Arthritis   . BPH (benign prostatic hyperplasia)   . Bronchitis   . Chronic constipation   . DDD (degenerative disc disease), cervical   . Diabetes mellitus without complication ([place])    type 2  . Diverticulosis   . DJD (degenerative joint disease)   . Ganglion cyst    12 mm  . History of blood transfusion   . Hyperlipidemia   . Internal hemorrhoids   . Low back pain   . Migraines   . Neck pain   . Obesity   . Pneumonia   . Positive QuantiFERON-TB Gold test   . Shoulder pain   . Sinusitis     Past Surgical History:  Procedure Laterality Date  . COLONOSCOPY  2018  . Hip Resurface  2006  . MASS EXCISION Right 04/21/2018   Procedure: EXCISION RIGHT THIGH SOFT TISSUE MASS;  Surgeon: [name], [name];  [facility];  Service: Orthopedics;  Laterality: Right;  . MENISCUS REPAIR  2008, 1993  . SLAP Tear Repair  2008  . TONSILLECTOMY    . TOTAL KNEE ARTHROPLASTY Left 12/08/2016   Procedure: LEFT TOTAL KNEE ARTHROPLASTY;  Surgeon: [name], [name];  [facility];  Service: Orthopedics;  Laterality: Left;  . TOTAL KNEE ARTHROPLASTY WITH REVISION COMPONENTS Left 04/21/2018   Procedure: Left knee polyethylene exchange ;  Surgeon: [name], [name];  [facility];  Service: Orthopedics;  Laterality: Left;    There were no vitals filed for this visit.  Subjective Assessment - 04/29/18 1143    Subjective  Doing well . Took pain meds and pain only 4/10    Pain Score  4     Pain Location  Knee    Pain Orientation  Left;Lateral    Pain Descriptors / Indicators  Sharp;Stabbing    Pain Type  Surgical pain    Pain Frequency  Constant         [facility] PT Assessment - 04/29/18 0001      AROM   Left Knee Flexion  112      PROM   Right/Left Knee  Left    Left Knee Flexion  116                   [facility] Adult PT Treatment/Exercise - 04/29/18 0001      Knee/Hip Exercises: Stretches   Other Knee/Hip Stretches  assisted flexion stretching.  x 5 60 sec and extension stretcing in supine with leg elevated      Knee/Hip Exercises: Seated   Long Arc  Quad  Left;15 reps    Other Seated Knee/Hip Exercises  QS 2x10 with assist for TKE 5 sec hold       Knee/Hip Exercises: Supine   Quad Sets  Left;15 reps    Straight Leg Raises  Left;15 reps      Knee/Hip Exercises: Sidelying   Hip ABduction  Right 2 reps      Vasopneumatic   Number Minutes Vasopneumatic   15 minutes    Vasopnuematic Location   Knee    Vasopneumatic Pressure  Medium    Vasopneumatic Temperature   32      Manual Therapy   Manual Therapy  Soft tissue mobilization;Passive ROM    Soft tissue mobilization  retro and STW to LT thigh and di ankle pumps and QS  and deep breath with STW for knee swelling. No measured as he does change of dressing and dressing  bulky    Passive ROM  flexion and extension LT knee               PT Short Term Goals - 04/27/18 1225      PT SHORT TERM GOAL #1   Title  pt will be I with initial HEP given     Time  4    Period  Weeks    Status  New      PT SHORT TERM GOAL #2   Title  pt will be able to verbalize and demo techniques to prevent/  reduce edema/ pain via RICE and HEP)    Time  4    Period  Weeks    Status  New      PT SHORT TERM GOAL #3   Title  pt will increase L knee flexion to >/= 110 degrees and extension to >/= -5 degrees for functional progression     Time  4    Period  Weeks    Status  New      PT SHORT TERM GOAL #4   Title  pt will be able to stand/ ambulate >/= 15 min with </= 5/10 pain with LRAD to promote functional mobililty (01/12/2017    Time  4    Period  Weeks    Status  New        PT Long Term Goals - 04/27/18 1229      PT LONG TERM GOAL #1   Title  pt will be I with all HEP given as of last visit     Time  12    Period  Weeks    Status  New      PT LONG TERM GOAL #2   Title  pt will increase L knee flexion to >/=125 degrees and extension to >/=-2 degrees with </= 2/10 pain for functional and efficient gait pattern (02/06/2017)    Time  12    Period  Weeks    Status  New      PT LONG TERM GOAL #3   Title  pt will increase L knee strength to >/= 4/5 for prolonged walking/ standing activities to promote safety (02/06/2017)    Time  12    Period  Weeks    Status  New      PT LONG TERM GOAL #4   Title  pt will be able to walk/ stand for >/= 30 minutes and navigate >/= 15 steps with >/=1 HHA for functional endurance and work related activities     Time  12  Period  Weeks    Status  New            Plan - 04/29/18 1140    Clinical Impression Statement  Flexion ROM improved.  Spent time with retrograde manual for edema.  Tolerating  teatment . As he is only a a week plus since surgery decided we did not need to provide more strenuous activity as ROm improving and pain controlled.   Continue next week ROm emphasis and quad strength    PT Treatment/Interventions  Manual techniques;Patient/family education;Therapeutic activities;Therapeutic exercise;Cryotherapy;Vasopneumatic Device;Passive range of motion;Taping;Functional mobility training;Gait training;Stair training    PT Next  Visit Plan  ROM and strength to tolerance , gait and balance , vaso more gentle for first 4-6 visits as long as progressing    PT Home Exercise Plan  QS,, SAQ, SLR, flex/ext stretching    Consulted and Agree with Plan of Care  Patient       Patient will benefit from skilled therapeutic intervention in order to improve the following deficits and impairments:  Pain, Decreased activity tolerance, Decreased range of motion, Decreased strength, Difficulty walking, Increased edema  Visit Diagnosis: Localized edema  Stiffness of left knee, not elsewhere classified  Muscle weakness (generalized)  Other abnormalities of gait and mobility  Acute pain of left knee     Problem List Patient Active Problem List   Diagnosis Date Noted  . Failed total knee arthroplasty ([place]) 04/21/2018  . OA (osteoarthritis) of knee 12/08/2016  . 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 Cervical facet joint syndrome 12/17/2015  . Chronic bilateral low back pain without sciatica 12/17/2015  . Primary osteoarthritis of left knee 12/17/2015    Darrel Hoover  PT 04/29/2018, 12:31 PM  [facility] [address] [place], [place], [address] Phone: [phone]   Fax:  [phone]  Name: Daniel RUMPF, [name] MRN: [id number] Date of Birth: Sep 13, 1961

## 2018-04-30 MED FILL — diazePAM 5 MG TABS: 5 | 30 days supply | Qty: 60 | Fill #0

## 2018-05-04 ENCOUNTER — Ambulatory Visit: Payer: 59

## 2018-05-04 DIAGNOSIS — M25562 Pain in left knee: Secondary | ICD-10-CM | POA: Diagnosis not present

## 2018-05-04 DIAGNOSIS — M25662 Stiffness of left knee, not elsewhere classified: Secondary | ICD-10-CM

## 2018-05-04 DIAGNOSIS — R600 Localized edema: Secondary | ICD-10-CM | POA: Diagnosis not present

## 2018-05-04 DIAGNOSIS — M6281 Muscle weakness (generalized): Secondary | ICD-10-CM | POA: Diagnosis not present

## 2018-05-04 DIAGNOSIS — R2689 Other abnormalities of gait and mobility: Secondary | ICD-10-CM | POA: Diagnosis not present

## 2018-05-04 NOTE — Therapy (Signed)
[place], [place], [address] Phone: [phone]   Fax:  [phone]  Physical Therapy Treatment  Patient Details  Name: Daniel VANGILDER, MD MRN: [id number] Date of Birth: 1961/04/16 Referring Provider: [name], MD   Encounter Date: 05/04/2018  PT End of Session - 05/04/18 1028    Visit Number  3    Date for PT Re-Evaluation  07/09/18    Authorization Type  UMR ,, TRICARE    PT Start Time  1018    PT Stop Time  1120    PT Time Calculation (min)  62 min    Equipment Utilized During Treatment  Gait belt    Activity Tolerance  Patient tolerated treatment well;No increased pain    Behavior During Therapy  WFL for tasks assessed/performed       Past Medical History:  Diagnosis Date  . Anxiety   . Aortic atherosclerosis ([place])   . Arthritis   . BPH (benign prostatic hyperplasia)   . Bronchitis   . Chronic constipation   . DDD (degenerative disc disease), cervical   . Diabetes mellitus without complication ([place])    type 2  . Diverticulosis   . DJD (degenerative joint disease)   . Ganglion cyst    12 mm  . History of blood transfusion   . Hyperlipidemia   . Internal hemorrhoids   . Low back pain   . Migraines   . Neck pain   . Obesity   . Pneumonia   . Positive QuantiFERON-TB Gold test   . Shoulder pain   . Sinusitis     Past Surgical History:  Procedure Laterality Date  . COLONOSCOPY  2018  . Hip Resurface  2006  . MASS EXCISION Right 04/21/2018   Procedure: EXCISION RIGHT THIGH SOFT TISSUE MASS;  Surgeon: [name], MD;  [facility];  Service: Orthopedics;  Laterality: Right;  . MENISCUS REPAIR  2008, 1993  . SLAP Tear Repair  2008  . TONSILLECTOMY    . 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 TOTAL KNEE ARTHROPLASTY WITH REVISION  COMPONENTS Left 04/21/2018   Procedure: Left knee polyethylene exchange ;  Surgeon: [name], MD;  [facility];  Service: Orthopedics;  Laterality: Left;    There were no vitals filed for this visit.  Subjective Assessment - 05/04/18 1027    Subjective  pain 4-5 today . Still using  walker    Patient Stated Goals  To return to normal activity, return to work    Pain Score  5     Pain Location  Knee    Pain Orientation  Left    Pain Descriptors / Indicators  Stabbing;Sharp    Pain Type  Surgical pain    Pain Frequency  Constant         [facility] PT Assessment - 05/04/18 0001      Assessment   Medical Diagnosis  Revision Lt TKA       AROM   Left Knee Extension  -20    Left Knee Flexion  115      PROM   Left Knee Flexion  120      Strength   Left Knee Extension  3+/5                   [facility] Adult PT Treatment/Exercise -  05/04/18 0001      Ambulation/Gait   Assistive device  Straight cane    Gait Pattern  Step-through pattern    Ambulation Surface  Level;Indoor    Gait Comments  appears stable with SPC . Advised to incr use as tolerated      Knee/Hip Exercises: Seated   Long Arc Quad  Left;20 reps;Weights    Long Arc Quad Weight  5 lbs.    Other Seated Knee/Hip Exercises  x20 QS for extension ROM hold 5 sec      Knee/Hip Exercises: Supine   Straight Leg Raises  Left;20 reps      Knee/Hip Exercises: Prone   Prone Knee Hang  4 minutes      Vasopneumatic   Number Minutes Vasopneumatic   15 minutes    Vasopnuematic Location   Knee    Vasopneumatic Pressure  Medium    Vasopneumatic Temperature   32      Manual Therapy   Manual Therapy  Joint mobilization    Joint Mobilization  AP glides Gr 4 5 bouts 100 reps,   patella mobs all directions.     Soft tissue mobilization  gentle scar mobs.     Passive ROM  flexion and extension LT knee               PT Short Term Goals - 05/04/18 1028      PT SHORT TERM GOAL #1   Title  pt will be I  with initial HEP given     Status  Achieved      PT SHORT TERM GOAL #2   Title  pt will be able to verbalize and demo techniques to prevent/ reduce edema/ pain via RICE and HEP)    Status  Achieved      PT SHORT TERM GOAL #3   Title  pt will increase L knee flexion to >/= 110 degrees and extension to >/= -5 degrees for functional progression     Baseline  flexion to 115   ext still -20    Status  Partially Met        PT Long Term Goals - 04/27/18 1229      PT LONG TERM GOAL #1   Title  pt will be I with all HEP given as of last visit     Time  12    Period  Weeks    Status  New      PT LONG TERM GOAL #2   Title  pt will increase L knee flexion to >/=125 degrees and extension to >/=-2 degrees with </= 2/10 pain for functional and efficient gait pattern (02/06/2017)    Time  12    Period  Weeks    Status  New      PT LONG TERM GOAL #3   Title  pt will increase L knee strength to >/= 4/5 for prolonged walking/ standing activities to promote safety (02/06/2017)    Time  12    Period  Weeks    Status  New      PT LONG TERM GOAL #4   Title  pt will be able to walk/ stand for >/= 30 minutes and navigate >/= 15 steps with >/=1 HHA for functional endurance and work related activities     Time  [place] - 05/04/18 1028  Clinical Impression Statement  Flexion continues to inmprove with extension more stagnent. Doing well with ll exercies and quad strength better.   Wil continue to work on ROM and strength.    SPC walking appears safe    PT Treatment/Interventions  Manual techniques;Patient/family education;Therapeutic activities;Therapeutic exercise;Cryotherapy;Vasopneumatic Device;Passive range of motion;Taping;Functional mobility training;Gait training;Stair training    PT Next Visit Plan  ROM and strength to tolerance , gait and balance , vaso, ROM  more gentle for first 4-6 visits as long as progressing    PT Home Exercise Plan   QS,, SAQ, SLR, flex/ext stretching    Consulted and Agree with Plan of Care  Patient       Patient will benefit from skilled therapeutic intervention in order to improve the following deficits and impairments:  Pain, Decreased activity tolerance, Decreased range of motion, Decreased strength, Difficulty walking, Increased edema  Visit Diagnosis: Localized edema  Stiffness of left knee, not elsewhere classified  Muscle weakness (generalized)  Other abnormalities of gait and mobility  Acute pain of left knee     Problem List Patient Active Problem List   Diagnosis Date Noted  . 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 OA (osteoarthritis) of knee 12/08/2016  . 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 Cervical facet joint syndrome 12/17/2015  . Chronic bilateral low back pain without sciatica 12/17/2015  . Primary osteoarthritis of left knee 12/17/2015    [name]  PT 05/04/2018, 11:16 AM  [facility] [address] [place], [place], [address] Phone: [phone]   Fax:  [phone]  Name: Daniel BACHA, MD MRN: [id number] Date of Birth: 06-08-1961

## 2018-05-06 ENCOUNTER — Ambulatory Visit: Payer: 59 | Admitting: Physical Therapy

## 2018-05-06 ENCOUNTER — Encounter: Payer: Self-pay | Admitting: Physical Therapy

## 2018-05-06 DIAGNOSIS — R2689 Other abnormalities of gait and mobility: Secondary | ICD-10-CM | POA: Diagnosis not present

## 2018-05-06 DIAGNOSIS — M25562 Pain in left knee: Secondary | ICD-10-CM | POA: Diagnosis not present

## 2018-05-06 DIAGNOSIS — M6281 Muscle weakness (generalized): Secondary | ICD-10-CM | POA: Diagnosis not present

## 2018-05-06 DIAGNOSIS — R600 Localized edema: Secondary | ICD-10-CM | POA: Diagnosis not present

## 2018-05-06 DIAGNOSIS — M25662 Stiffness of left knee, not elsewhere classified: Secondary | ICD-10-CM

## 2018-05-06 NOTE — Therapy (Signed)
[place], [place], [address] Phone: [phone]   Fax:  [phone]  Physical Therapy Treatment  Patient Details  Name: Daniel ROBAR, MD MRN: [id number] Date of Birth: 23-Mar-1961 Referring Provider: [name], MD   Encounter Date: 05/06/2018  PT End of Session - 05/06/18 1141    Visit Number  4    Number of Visits  24    Date for PT Re-Evaluation  07/09/18    Authorization Type  [employer]R ,, TRICARE    PT Start Time  1735    PT Stop Time  6701    PT Time Calculation (min)  60 min    Activity Tolerance  Patient tolerated treatment well       Past Medical History:  Diagnosis Date  . Anxiety   . Aortic atherosclerosis ([place])   . Arthritis   . BPH (benign prostatic hyperplasia)   . Bronchitis   . Chronic constipation   . DDD (degenerative disc disease), cervical   . Diabetes mellitus without complication ([place])    type 2  . Diverticulosis   . DJD (degenerative joint disease)   . Ganglion cyst    12 mm  . History of blood transfusion   . Hyperlipidemia   . Internal hemorrhoids   . Low back pain   . Migraines   . Neck pain   . Obesity   . Pneumonia   . Positive QuantiFERON-TB Gold test   . Shoulder pain   . Sinusitis     Past Surgical History:  Procedure Laterality Date  . COLONOSCOPY  2018  . Hip Resurface  2006  . MASS EXCISION Right 04/21/2018   Procedure: EXCISION RIGHT THIGH SOFT TISSUE MASS;  Surgeon: [name], MD;  Location: [facility];  Service: Orthopedics;  Laterality: Right;  . MENISCUS REPAIR  2008, 1993  . SLAP Tear Repair  2008  . TONSILLECTOMY    . 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 TOTAL KNEE ARTHROPLASTY WITH REVISION COMPONENTS Left 04/21/2018   Procedure: Left knee polyethylene exchange ;  Surgeon: [name], MD;  Location:  [facility];  Service: Orthopedics;  Laterality: Left;    There were no vitals filed for this visit.  Subjective Assessment - 05/06/18 1144    Subjective  Pt reports he is doing well with his HEP, doing well without walker.     Patient Stated Goals  To return to normal activity, return to work    Currently in Pain?  No/denies was hurting this AM, medication took it away         [facility] PT Assessment - 05/06/18 0001      Assessment   Medical Diagnosis  Revision Lt TKA       AROM   Left Knee Extension  -11    Left Knee Flexion  120                   [facility] Adult PT Treatment/Exercise - 05/06/18 0001      Knee/Hip Exercises: Stretches   Active Hamstring Stretch  Left supine with strap and 30reps knee presses    Quad Stretch  Left;3 reps;60 seconds prone with strap      Knee/Hip Exercises: Aerobic   Stationary Bike  L1x5'      Knee/Hip Exercises: Standing   Terminal Knee Extension  Left;Strengthening;10 reps pressing into a ball at the wall    SLS  Lt next to counter, eyes open and eyes closed.     SLS with Vectors  Lt with toe taps Rt FWD/Side BWD      Knee/Hip Exercises: Seated   Long Arc Quad  Strengthening;Left;2 sets;15 reps;Weights    Long Arc Quad Weight  5 lbs.      Knee/Hip Exercises: Prone   Hamstring Curl  10 reps    Other Prone Exercises  10 reps quad sets bilat      Modalities   Modalities  Vasopneumatic      Vasopneumatic   Number Minutes Vasopneumatic   15 minutes    Vasopnuematic Location   Knee    Vasopneumatic Pressure  Medium    Vasopneumatic Temperature   3*      Manual Therapy   Manual Therapy  Soft tissue mobilization;Joint mobilization    Joint Mobilization  Lt knee extension mobs in prone grade III, 4 bouts, 30 sec    Soft tissue mobilization  STM to Lt hamstring and posterior knee in prone               PT Short Term Goals - 05/04/18 1028      PT SHORT TERM GOAL #1   Title  pt will be I with initial HEP given     Status   Achieved      PT SHORT TERM GOAL #2   Title  pt will be able to verbalize and demo techniques to prevent/ reduce edema/ pain via RICE and HEP)    Status  Achieved      PT SHORT TERM GOAL #3   Title  pt will increase L knee flexion to >/= 110 degrees and extension to >/= -5 degrees for functional progression     Baseline  flexion to 115   ext still -20    Status  Partially Met        PT Long Term Goals - 04/27/18 1229      PT LONG TERM GOAL #1   Title  pt will be I with all HEP given as of last visit     Time  12    Period  Weeks    Status  New      PT LONG TERM GOAL #2   Title  pt will increase L knee flexion to >/=125 degrees and extension to >/=-2 degrees with </= 2/10 pain for functional and efficient gait pattern (02/06/2017)    Time  12    Period  Weeks    Status  New      PT LONG TERM GOAL #3   Title  pt will increase L knee strength to >/= 4/5 for prolonged walking/ standing activities to promote safety (02/06/2017)    Time  12    Period  Weeks    Status  New      PT LONG TERM GOAL #4   Title  pt will be able to walk/ stand for >/= 30 minutes and navigate >/= 15 steps with >/=1 HHA for functional endurance and work related activities     Time  [place] - 05/06/18 1228    Clinical Impression Statement  [name] demo' improved Lt knee flex and extension.  fatigued quickly with standing proprioception work.  This was added  to HEP.  Needs to continue to work on Lt quad contraction. Doing well with walking with SPC and no device for short distances. Does have antalgic gait pattern.     Rehab Potential  Good    PT Frequency  3x / week    PT Duration  2 weeks    PT Treatment/Interventions  Manual techniques;Patient/family education;Therapeutic activities;Therapeutic exercise;Cryotherapy;Vasopneumatic Device;Passive range of motion;Taping;Functional mobility training;Gait training;Stair training    PT Next Visit Plan  ROM and  strength to tolerance , gait and balance , vaso, ROM  more gentle for first 4-6 visits as long as progressing    Consulted and Agree with Plan of Care  Patient       Patient will benefit from skilled therapeutic intervention in order to improve the following deficits and impairments:  Pain, Decreased activity tolerance, Decreased range of motion, Decreased strength, Difficulty walking, Increased edema  Visit Diagnosis: Localized edema  Stiffness of left knee, not elsewhere classified  Muscle weakness (generalized)  Other abnormalities of gait and mobility  Acute pain of left knee     Problem List Patient Active Problem List   Diagnosis Date Noted  . Failed total knee arthroplasty ([place]) 04/21/2018  . OA (osteoarthritis) of knee 12/08/2016  . 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 Cervical facet joint syndrome 12/17/2015  . Chronic bilateral low back pain without sciatica 12/17/2015  . Primary osteoarthritis of left knee 12/17/2015    Jeral Pinch PT  05/06/2018, 12:32 PM  [facility] [address] [place], [place], [address] Phone: [phone]   Fax:  [phone]  Name: Daniel TUTSON, MD MRN: [id number] Date of Birth: 1961-05-12

## 2018-05-06 NOTE — Patient Instructions (Addendum)
Balance: Unilateral - perform next to a counter for safety   Attempt to balance on left leg, eyes open. Hold _as long as able___ seconds. Repeat __5-10 times per set. Do __1__ sets per session. Do _1___ sessions per day.  Balance: Three-Way Leg Swing - perform next to a counter for safety and hold on as needed     Stand on left foot, hands on hips. Reach other foot forward __1__ times, sideways __1__ times, back __1__ times. Hold each position __1__ seconds. Relax. Repeat __10__ times per set. Do __2-3__ sets per session. Do _1___ sessions per day.

## 2018-05-11 ENCOUNTER — Ambulatory Visit: Payer: 59 | Admitting: Physical Therapy

## 2018-05-11 ENCOUNTER — Encounter: Payer: Self-pay | Admitting: Physical Therapy

## 2018-05-11 DIAGNOSIS — M25662 Stiffness of left knee, not elsewhere classified: Secondary | ICD-10-CM

## 2018-05-11 DIAGNOSIS — M6281 Muscle weakness (generalized): Secondary | ICD-10-CM | POA: Diagnosis not present

## 2018-05-11 DIAGNOSIS — R2689 Other abnormalities of gait and mobility: Secondary | ICD-10-CM | POA: Diagnosis not present

## 2018-05-11 DIAGNOSIS — M25562 Pain in left knee: Secondary | ICD-10-CM | POA: Diagnosis not present

## 2018-05-11 DIAGNOSIS — R600 Localized edema: Secondary | ICD-10-CM | POA: Diagnosis not present

## 2018-05-11 NOTE — Therapy (Signed)
[place], [place], [address] Phone: [phone]   Fax:  [phone]  Physical Therapy Treatment  Patient Details  Name: Daniel ARMENDARIZ, [name] MRN: [id number] Date of Birth: 1961-06-16 Referring Provider: [name], [name]   Encounter Date: 05/11/2018  PT End of Session - 05/11/18 1300    Visit Number  5    Number of Visits  24    Date for PT Re-Evaluation  07/09/18    Authorization Type  UMR ,, TRICARE    PT Start Time  1147    PT Stop Time  1240    PT Time Calculation (min)  53 min    Activity Tolerance  Patient tolerated treatment well    Behavior During Therapy  [facility] for tasks assessed/performed       Past Medical History:  Diagnosis Date  . Anxiety   . Aortic atherosclerosis ([place])   . Arthritis   . BPH (benign prostatic hyperplasia)   . Bronchitis   . Chronic constipation   . DDD (degenerative disc disease), cervical   . Diabetes mellitus without complication ([place])    type 2  . Diverticulosis   . DJD (degenerative joint disease)   . Ganglion cyst    12 mm  . History of blood transfusion   . Hyperlipidemia   . Internal hemorrhoids   . Low back pain   . Migraines   . Neck pain   . Obesity   . Pneumonia   . Positive QuantiFERON-TB Gold test   . Shoulder pain   . Sinusitis     Past Surgical History:  Procedure Laterality Date  . COLONOSCOPY  2018  . Hip Resurface  2006  . MASS EXCISION Right 04/21/2018   Procedure: EXCISION RIGHT THIGH SOFT TISSUE MASS;  Surgeon: [name], [name];  [facility];  Service: Orthopedics;  Laterality: Right;  . MENISCUS REPAIR  2008, 1993  . SLAP Tear Repair  2008  . TONSILLECTOMY    . TOTAL KNEE ARTHROPLASTY Left 12/08/2016   Procedure: LEFT TOTAL KNEE ARTHROPLASTY;  Surgeon: [name], [name];  [facility];  Service: Orthopedics;  Laterality: Left;  . TOTAL KNEE ARTHROPLASTY WITH REVISION COMPONENTS Left 04/21/2018   Procedure: Left knee  polyethylene exchange ;  Surgeon: [name], [name];  [facility];  Service: Orthopedics;  Laterality: Left;    There were no vitals filed for this visit.  Subjective Assessment - 05/11/18 1148    Subjective  Knee is doing pretty well today but I took pain meds before I came. Pain was about 4-5 before pain meds. A little swelling noted with exercises at home. Still using SPC out in the community due to lack of extension causing abnormal gait pattern.     Currently in Pain?  Yes    Pain Score  3     Pain Location  Knee    Pain Orientation  Left;Lateral    Pain Descriptors / Indicators  Sore                       [facility] Adult PT Treatment/Exercise - 05/11/18 0001      Knee/Hip Exercises: Stretches   Passive Hamstring Stretch  Left;2 reps;30 seconds    Gastroc Stretch  Both;2 reps;30 seconds slant board      Knee/Hip Exercises: Standing   SLS  short holds with chair    Other Standing Knee Exercises  standing glut+quad sets  Knee/Hip Exercises: Supine   Straight Leg Raises  10 reps quad set to mini SLR 3s hold      Knee/Hip Exercises: Sidelying   Clams  2x15 Lt    Other Sidelying Knee/Hip Exercises  3/4 length abd to knee extnsion      Vasopneumatic   Number Minutes Vasopneumatic   15 minutes    Vasopnuematic Location   Knee    Vasopneumatic Pressure  Medium    Vasopneumatic Temperature   lowest      Manual Therapy   Soft tissue mobilization  IASTM Lt hamstrings/gastroc in prone hang               PT Short Term Goals - 05/04/18 1028      PT SHORT TERM GOAL #1   Title  pt will be I with initial HEP given     Status  Achieved      PT SHORT TERM GOAL #2   Title  pt will be able to verbalize and demo techniques to prevent/ reduce edema/ pain via RICE and HEP)    Status  Achieved      PT SHORT TERM GOAL #3   Title  pt will increase L knee flexion to >/= 110 degrees and extension to >/= -5 degrees for functional progression     Baseline   flexion to 115   ext still -20    Status  Partially Met        PT Long Term Goals - 04/27/18 1229      PT LONG TERM GOAL #1   Title  pt will be I with all HEP given as of last visit     Time  12    Period  Weeks    Status  New      PT LONG TERM GOAL #2   Title  pt will increase L knee flexion to >/=125 degrees and extension to >/=-2 degrees with </= 2/10 pain for functional and efficient gait pattern (02/06/2017)    Time  12    Period  Weeks    Status  New      PT LONG TERM GOAL #3   Title  pt will increase L knee strength to >/= 4/5 for prolonged walking/ standing activities to promote safety (02/06/2017)    Time  12    Period  Weeks    Status  New      PT LONG TERM GOAL #4   Title  pt will be able to walk/ stand for >/= 30 minutes and navigate >/= 15 steps with >/=1 HHA for functional endurance and work related activities     Time  12    Period  Weeks    Status  New            Plan - 05/11/18 1302    Clinical Impression Statement  Pt tolerated exercises well today. Encouraged acivation of gluts in standing to improve full biomechanical chain alignment when pressing knee into extension. Added strengthening to hip abductors for improved strength and stability. Will continue to progress ROM and challenge strength as appropriate. Encouraged pt to let lower leg hang from side of bed and allow mild traction to decrease stiffness prior to stretching in the morning    PT Treatment/Interventions  Manual techniques;Patient/family education;Therapeutic activities;Therapeutic exercise;Cryotherapy;Vasopneumatic Device;Passive range of motion;Taping;Functional mobility training;Gait training;Stair training    PT Next Visit Plan  ROM and strength to tolerance , gait and balance , vaso, ROM  more  gentle for first 4-6 visits as long as progressing; pt would like to try eliptical    PT Home Exercise Plan  QS,, SAQ, SLR, flex/ext stretching; clams, seated HSS, standing glut sets    Consulted  and Agree with Plan of Care  Patient       Patient will benefit from skilled therapeutic intervention in order to improve the following deficits and impairments:  Pain, Decreased activity tolerance, Decreased range of motion, Decreased strength, Difficulty walking, Increased edema  Visit Diagnosis: Localized edema  Stiffness of left knee, not elsewhere classified  Muscle weakness (generalized)  Other abnormalities of gait and mobility     Problem List Patient Active Problem List   Diagnosis Date Noted  . Failed total knee arthroplasty ([place]) 04/21/2018  . OA (osteoarthritis) of knee 12/08/2016  . 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 Cervical facet joint syndrome 12/17/2015  . Chronic bilateral low back pain without sciatica 12/17/2015  . Primary osteoarthritis of left knee 12/17/2015   Kierre Deines C. Patton Swisher PT, DPT 05/11/18 1:06 PM   [facility] [address] [place], [place], [address] Phone: [phone]   Fax:  [phone]  Name: Daniel GIOVANETTI, [name] MRN: [id number] Date of Birth: 1961/02/04

## 2018-05-12 DIAGNOSIS — M542 Cervicalgia: Secondary | ICD-10-CM | POA: Diagnosis not present

## 2018-05-12 DIAGNOSIS — M545 Low back pain: Secondary | ICD-10-CM | POA: Diagnosis not present

## 2018-05-12 DIAGNOSIS — M9903 Segmental and somatic dysfunction of lumbar region: Secondary | ICD-10-CM | POA: Diagnosis not present

## 2018-05-12 DIAGNOSIS — M546 Pain in thoracic spine: Secondary | ICD-10-CM | POA: Diagnosis not present

## 2018-05-13 ENCOUNTER — Ambulatory Visit: Payer: 59 | Attending: Student | Admitting: Physical Therapy

## 2018-05-13 DIAGNOSIS — R2689 Other abnormalities of gait and mobility: Secondary | ICD-10-CM | POA: Insufficient documentation

## 2018-05-13 DIAGNOSIS — M6281 Muscle weakness (generalized): Secondary | ICD-10-CM | POA: Insufficient documentation

## 2018-05-13 DIAGNOSIS — M25662 Stiffness of left knee, not elsewhere classified: Secondary | ICD-10-CM | POA: Insufficient documentation

## 2018-05-13 DIAGNOSIS — R600 Localized edema: Secondary | ICD-10-CM | POA: Insufficient documentation

## 2018-05-13 DIAGNOSIS — M25562 Pain in left knee: Secondary | ICD-10-CM | POA: Insufficient documentation

## 2018-05-18 ENCOUNTER — Encounter: Payer: Self-pay | Admitting: Physical Therapy

## 2018-05-18 ENCOUNTER — Ambulatory Visit: Payer: 59 | Admitting: Physical Therapy

## 2018-05-18 DIAGNOSIS — M25662 Stiffness of left knee, not elsewhere classified: Secondary | ICD-10-CM | POA: Diagnosis not present

## 2018-05-18 DIAGNOSIS — M25562 Pain in left knee: Secondary | ICD-10-CM | POA: Diagnosis not present

## 2018-05-18 DIAGNOSIS — R600 Localized edema: Secondary | ICD-10-CM

## 2018-05-18 DIAGNOSIS — R2689 Other abnormalities of gait and mobility: Secondary | ICD-10-CM | POA: Diagnosis not present

## 2018-05-18 DIAGNOSIS — M6281 Muscle weakness (generalized): Secondary | ICD-10-CM | POA: Diagnosis not present

## 2018-05-18 NOTE — Therapy (Signed)
[place], [place], [address] Phone: [phone]   Fax:  [phone]  Physical Therapy Treatment  Patient Details  Name: Daniel SALLY, MD MRN: [id number] Date of Birth: [birth year] Referring Provider: [name], MD   Encounter Date: 05/18/2018  PT End of Session - 05/18/18 1147    Visit Number  6    Number of Visits  24    Date for PT Re-Evaluation  07/09/18    Authorization Type  UMR ,, TRICARE    PT Start Time  1147    PT Stop Time  1234    PT Time Calculation (min)  47 min    Activity Tolerance  Patient tolerated treatment well    Behavior During Therapy  [facility] for tasks assessed/performed       Past Medical History:  Diagnosis Date  . Anxiety   . Aortic atherosclerosis ([place])   . Arthritis   . BPH (benign prostatic hyperplasia)   . Bronchitis   . Chronic constipation   . DDD (degenerative disc disease), cervical   . Diabetes mellitus without complication ([place])    type 2  . Diverticulosis   . DJD (degenerative joint disease)   . Ganglion cyst    12 mm  . History of blood transfusion   . Hyperlipidemia   . Internal hemorrhoids   . Low back pain   . Migraines   . Neck pain   . Obesity   . Pneumonia   . Positive QuantiFERON-TB Gold test   . Shoulder pain   . Sinusitis     Past Surgical History:  Procedure Laterality Date  . COLONOSCOPY  2018  . Hip Resurface  2006  . MASS EXCISION Right 04/21/2018   Procedure: EXCISION RIGHT THIGH SOFT TISSUE MASS;  Surgeon: [name], MD;  [facility];  Service: Orthopedics;  Laterality: Right;  . MENISCUS REPAIR  2008, 1993  . SLAP Tear Repair  2008  . TONSILLECTOMY    . 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 TOTAL KNEE ARTHROPLASTY WITH REVISION COMPONENTS Left 04/21/2018   Procedure: Left knee  polyethylene exchange ;  Surgeon: [name], MD;  [facility];  Service: Orthopedics;  Laterality: Left;    There were no vitals filed for this visit.  Subjective Assessment - 05/18/18 1147    Subjective  Feeling pretty good. Standing and steps are uncomfortable. I think it is all the way straight. I still feel like i do not have a natural gait    Patient Stated Goals  To return to normal activity, return to work    Currently in Pain?  Yes    Pain Score  4  0 before exteding leg    Pain Location  Knee    Pain Orientation  Left         [facility] PT Assessment - 05/18/18 0001      AROM   Left Knee Extension  -1                   [facility] Adult PT Treatment/Exercise - 05/18/18 0001      Knee/Hip Exercises: Standing   Stairs  eccentric step down standing on Lt leg    Gait Training  hip flexion in swing through, knee ext in heel strike, trunk rotation/ UE swing    Other  Standing Knee Exercises  sit><stand gluts, inhale to stand      Vasopneumatic   Number Minutes Vasopneumatic   15 minutes    Vasopnuematic Location   Knee    Vasopneumatic Pressure  High    Vasopneumatic Temperature   lowest               PT Short Term Goals - 05/04/18 1028      PT SHORT TERM GOAL #1   Title  pt will be I with initial HEP given     Status  Achieved      PT SHORT TERM GOAL #2   Title  pt will be able to verbalize and demo techniques to prevent/ reduce edema/ pain via RICE and HEP)    Status  Achieved      PT SHORT TERM GOAL #3   Title  pt will increase L knee flexion to >/= 110 degrees and extension to >/= -5 degrees for functional progression     Baseline  flexion to 115   ext still -20    Status  Partially Met        PT Long Term Goals - 04/27/18 1229      PT LONG TERM GOAL #1   Title  pt will be I with all HEP given as of last visit     Time  12    Period  Weeks    Status  New      PT LONG TERM GOAL #2   Title  pt will increase L knee flexion to  >/=125 degrees and extension to >/=-2 degrees with </= 2/10 pain for functional and efficient gait pattern (02/06/2017)    Time  12    Period  Weeks    Status  New      PT LONG TERM GOAL #3   Title  pt will increase L knee strength to >/= 4/5 for prolonged walking/ standing activities to promote safety (02/06/2017)    Time  12    Period  Weeks    Status  New      PT LONG TERM GOAL #4   Title  pt will be able to walk/ stand for >/= 30 minutes and navigate >/= 15 steps with >/=1 HHA for functional endurance and work related activities     Time  12    Period  Weeks    Status  New            Plan - 05/18/18 1221    Clinical Impression Statement  Utilized functional movements for strengthening in gait, sit<>stand and stair patterns. Advised pt that he will likely have increased swelling and to ice/elevate again today. Is now able to ambulate with proper hip flexion in swing through and knee extended in heel strike. Pt would like to move to strength training when able- advised that I would like to see good carryover of functional movements that we practiced today before moving to a weight training focus.     PT Treatment/Interventions  Manual techniques;Patient/family education;Therapeutic activities;Therapeutic exercise;Cryotherapy;Vasopneumatic Device;Passive range of motion;Taping;Functional mobility training;Gait training;Stair training    PT Next Visit Plan  eliptical, check gait pattern carryover, hip abduction/glut strengthening    PT Home Exercise Plan  QS,, SAQ, SLR, flex/ext stretching; clams, seated HSS, standing glut sets    Consulted and Agree with Plan of Care  Patient       Patient will benefit from skilled therapeutic intervention in order to improve the following deficits  and impairments:  Pain, Decreased activity tolerance, Decreased range of motion, Decreased strength, Difficulty walking, Increased edema  Visit Diagnosis: Localized edema  Stiffness of left knee, not  elsewhere classified  Muscle weakness (generalized)  Other abnormalities of gait and mobility     Problem List Patient Active Problem List   Diagnosis Date Noted  . Failed total knee arthroplasty ([place]) 04/21/2018  . OA (osteoarthritis) of knee 12/08/2016  . 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 Cervical facet joint syndrome 12/17/2015  . Chronic bilateral low back pain without sciatica 12/17/2015  . Primary osteoarthritis of left knee 12/17/2015    Shyane Fossum C. Lonna Rabold PT, DPT 05/18/18 12:56 PM   [facility] [address] [place], [place], [address] Phone: [phone]   Fax:  [phone]  Name: LADON VANDENBERGHE, MD MRN: [id number] Date of Birth: 11-23-1960

## 2018-05-20 ENCOUNTER — Ambulatory Visit: Payer: 59

## 2018-05-20 DIAGNOSIS — R2689 Other abnormalities of gait and mobility: Secondary | ICD-10-CM

## 2018-05-20 DIAGNOSIS — M6281 Muscle weakness (generalized): Secondary | ICD-10-CM | POA: Diagnosis not present

## 2018-05-20 DIAGNOSIS — M25662 Stiffness of left knee, not elsewhere classified: Secondary | ICD-10-CM | POA: Diagnosis not present

## 2018-05-20 DIAGNOSIS — R600 Localized edema: Secondary | ICD-10-CM | POA: Diagnosis not present

## 2018-05-20 DIAGNOSIS — M25562 Pain in left knee: Secondary | ICD-10-CM | POA: Diagnosis not present

## 2018-05-20 NOTE — Therapy (Signed)
[place], [place], [address] Phone: [phone]   Fax:  [phone]  Physical Therapy Treatment  Patient Details  Name: Daniel HALLISEY, MD MRN: [id number] Date of Birth: [birth year] Referring Provider: [name], MD   Encounter Date: 05/20/2018  PT End of Session - 05/20/18 1051    Visit Number  7    Number of Visits  24    Date for PT Re-Evaluation  07/09/18    Authorization Type  UMR ,, TRICARE    PT Start Time  1055    PT Stop Time  1200    PT Time Calculation (min)  65 min    Activity Tolerance  Patient tolerated treatment well    Behavior During Therapy  [facility] for tasks assessed/performed       Past Medical History:  Diagnosis Date  . Anxiety   . Aortic atherosclerosis ([place])   . Arthritis   . BPH (benign prostatic hyperplasia)   . Bronchitis   . Chronic constipation   . DDD (degenerative disc disease), cervical   . Diabetes mellitus without complication ([place])    type 2  . Diverticulosis   . DJD (degenerative joint disease)   . Ganglion cyst    12 mm  . History of blood transfusion   . Hyperlipidemia   . Internal hemorrhoids   . Low back pain   . Migraines   . Neck pain   . Obesity   . Pneumonia   . Positive QuantiFERON-TB Gold test   . Shoulder pain   . Sinusitis     Past Surgical History:  Procedure Laterality Date  . COLONOSCOPY  2018  . Hip Resurface  2006  . MASS EXCISION Right 04/21/2018   Procedure: EXCISION RIGHT THIGH SOFT TISSUE MASS;  Surgeon: [name], MD;  [facility];  Service: Orthopedics;  Laterality: Right;  . MENISCUS REPAIR  2008, 1993  . SLAP Tear Repair  2008  . TONSILLECTOMY    . 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 TOTAL KNEE ARTHROPLASTY WITH REVISION COMPONENTS Left 04/21/2018   Procedure: Left knee  polyethylene exchange ;  Surgeon: [name], MD;  [facility];  Service: Orthopedics;  Laterality: Left;    There were no vitals filed for this visit.  Subjective Assessment - 05/20/18 1107    Subjective  4-5 pain Lt knee on ;lateral aspect.     Pain Score  5     Pain Location  Knee    Pain Orientation  Left;Lateral    Pain Descriptors / Indicators  Sore    Pain Type  Surgical pain    Pain Frequency  Constant                       [facility] Adult PT Treatment/Exercise - 05/20/18 0001      Knee/Hip Exercises: Standing   Stairs  eccentric step down standing on Lt leg x20 3 inch step with UE siupport bilateral     Gait Training  hip flexion in swing through, knee ext in heel strike, trunk rotation/ UE swing    Other Standing Knee Exercises  sit><stand x 20 reps with blue band attached to shoe Lt foot, also side steps RT and LT with blue band on ankles x 5 trips 7-8 steps each then marching with  high knee lifts for balance 15 feet x 10, then walking backward       Vasopneumatic   Number Minutes Vasopneumatic   15 minutes    Vasopnuematic Location   Knee    Vasopneumatic Pressure  High    Vasopneumatic Temperature   lowest             PT Education - 05/20/18 1147    Education Details  Issued bands for side stepping and LE LT swing phase  con and ccentric     Person(s) Educated  Patient    Methods  Explanation;Tactile cues;Verbal cues    Comprehension  Returned demonstration;Verbalized understanding                 Plan - 05/20/18 1051    Clinical Impression Statement  He is much improved since I last saw hime . Able to use blue band for strengthening. and did not lose balance though unsteady 3-4 times.   Eccentric quads /pain  limitiing.     PT Treatment/Interventions  Manual techniques;Patient/family education;Therapeutic activities;Therapeutic exercise;Cryotherapy;Vasopneumatic Device;Passive range of motion;Taping;Functional mobility  training;Gait training;Stair training    PT Next Visit Plan  eliptical, check gait pattern carryover, hip abduction/glut strengthening    PT Home Exercise Plan  QS,, SAQ, SLR, flex/ext stretching; clams, seated HSS, standing glut sets    Consulted and Agree with Plan of Care  Patient       Patient will benefit from skilled therapeutic intervention in order to improve the following deficits and impairments:  Pain, Decreased activity tolerance, Decreased range of motion, Decreased strength, Difficulty walking, Increased edema  Visit Diagnosis: Localized edema  Stiffness of left knee, not elsewhere classified  Muscle weakness (generalized)  Other abnormalities of gait and mobility     Problem List Patient Active Problem List   Diagnosis Date Noted  . Failed total knee arthroplasty ([place]) 04/21/2018  . OA (osteoarthritis) of knee 12/08/2016  . 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 Cervical facet joint syndrome 12/17/2015  . Chronic bilateral low back pain without sciatica 12/17/2015  . Primary osteoarthritis of left knee 12/17/2015    Darrel Hoover ,PT 05/20/2018, 11:53 AM  [facility] [address] [place], [place], [address] Phone: [phone]   Fax:  [phone]  Name: Daniel LETIZIA, MD MRN: [id number] Date of Birth: 1961/09/17

## 2018-05-25 ENCOUNTER — Ambulatory Visit: Payer: 59

## 2018-05-25 DIAGNOSIS — Z96652 Presence of left artificial knee joint: Secondary | ICD-10-CM | POA: Diagnosis not present

## 2018-05-25 DIAGNOSIS — Z471 Aftercare following joint replacement surgery: Secondary | ICD-10-CM | POA: Diagnosis not present

## 2018-05-25 DIAGNOSIS — Z96659 Presence of unspecified artificial knee joint: Secondary | ICD-10-CM | POA: Diagnosis not present

## 2018-05-26 MED FILL — TADALAFIL 5 MG TABS: 5 | 30 days supply | Qty: 30 | Fill #3

## 2018-05-27 ENCOUNTER — Ambulatory Visit: Payer: 59

## 2018-05-27 DIAGNOSIS — M6281 Muscle weakness (generalized): Secondary | ICD-10-CM

## 2018-05-27 DIAGNOSIS — R2689 Other abnormalities of gait and mobility: Secondary | ICD-10-CM | POA: Diagnosis not present

## 2018-05-27 DIAGNOSIS — R600 Localized edema: Secondary | ICD-10-CM | POA: Diagnosis not present

## 2018-05-27 DIAGNOSIS — M25662 Stiffness of left knee, not elsewhere classified: Secondary | ICD-10-CM

## 2018-05-27 DIAGNOSIS — M25562 Pain in left knee: Secondary | ICD-10-CM | POA: Diagnosis not present

## 2018-05-27 NOTE — Therapy (Signed)
[place], [place], [address] Phone: [phone]   Fax:  [phone]  Physical Therapy Treatment  Patient Details  Name: Daniel ARIF, [name] MRN: [id number] Date of Birth: 1961-09-30 Referring Provider: [name], [name]   Encounter Date: 05/27/2018  PT End of Session - 05/27/18 1152    Visit Number  8    Number of Visits  24    Date for PT Re-Evaluation  07/09/18    Authorization Type  UMR ,, TRICARE    PT Start Time  8841    PT Stop Time  1240    PT Time Calculation (min)  55 min    Activity Tolerance  Patient tolerated treatment well    Behavior During Therapy  [facility] for tasks assessed/performed       Past Medical History:  Diagnosis Date  . Anxiety   . Aortic atherosclerosis ([place])   . Arthritis   . BPH (benign prostatic hyperplasia)   . Bronchitis   . Chronic constipation   . DDD (degenerative disc disease), cervical   . Diabetes mellitus without complication ([place])    type 2  . Diverticulosis   . DJD (degenerative joint disease)   . Ganglion cyst    12 mm  . History of blood transfusion   . Hyperlipidemia   . Internal hemorrhoids   . Low back pain   . Migraines   . Neck pain   . Obesity   . Pneumonia   . Positive QuantiFERON-TB Gold test   . Shoulder pain   . Sinusitis     Past Surgical History:  Procedure Laterality Date  . COLONOSCOPY  2018  . Hip Resurface  2006  . MASS EXCISION Right 04/21/2018   Procedure: EXCISION RIGHT THIGH SOFT TISSUE MASS;  Surgeon: [name], [name];  [facility];  Service: Orthopedics;  Laterality: Right;  . MENISCUS REPAIR  2008, 1993  . SLAP Tear Repair  2008  . TONSILLECTOMY    . TOTAL KNEE ARTHROPLASTY Left 12/08/2016   Procedure: LEFT TOTAL KNEE ARTHROPLASTY;  Surgeon: [name], [name];  [facility];  Service: Orthopedics;  Laterality: Left;  . TOTAL KNEE ARTHROPLASTY WITH REVISION COMPONENTS Left 04/21/2018   Procedure: Left knee  polyethylene exchange ;  Surgeon: [name], [name];  [facility];  Service: Orthopedics;  Laterality: Left;    There were no vitals filed for this visit.  Subjective Assessment - 05/27/18 1234    Subjective  Knee pain early AM 6/10 now 4/10.   walked 1/2 mile. Caught in traffic jam so missed last appointment                       [facility] Adult PT Treatment/Exercise - 05/27/18 0001      Knee/Hip Exercises: Aerobic   Stationary Bike  L2  6 min      Knee/Hip Exercises: Standing   Stairs  eccentric step down standing on Lt leg x20 3 inch step with UE siupport bilateral       Knee/Hip Exercises: Seated   Long Arc [employer] reps   more of SAQ   Long Arc Quad Weight  7 lbs.    Other Seated Knee/Hip Exercises  sit to stand x 20 using hips well , then 20 reps QS for knee extension stretch      Knee/Hip Exercises: Prone   Hamstring Curl  20 reps      Vasopneumatic  Number Minutes Vasopneumatic   15 minutes    Vasopnuematic Location   Knee    Vasopneumatic Pressure  High    Vasopneumatic Temperature   lowest      Manual Therapy   Joint Mobilization  Lt knee extension mobs in supine grade III, 6 bouts, 30 sec or more    Passive ROM  extension stretchin g and gastroc stretch               PT Short Term Goals - 05/04/18 1028      PT SHORT TERM GOAL #1   Title  pt will be I with initial HEP given     Status  Achieved      PT SHORT TERM GOAL #2   Title  pt will be able to verbalize and demo techniques to prevent/ reduce edema/ pain via RICE and HEP)    Status  Achieved      PT SHORT TERM GOAL #3   Title  pt will increase L knee flexion to >/= 110 degrees and extension to >/= -5 degrees for functional progression     Baseline  flexion to 115   ext still -20    Status  Partially Met        PT Long Term Goals - 04/27/18 1229      PT LONG TERM GOAL #1   Title  pt will be I with all HEP given as of last visit     Time  12    Period  Weeks     Status  New      PT LONG TERM GOAL #2   Title  pt will increase L knee flexion to >/=125 degrees and extension to >/=-2 degrees with </= 2/10 pain for functional and efficient gait pattern (02/06/2017)    Time  12    Period  Weeks    Status  New      PT LONG TERM GOAL #3   Title  pt will increase L knee strength to >/= 4/5 for prolonged walking/ standing activities to promote safety (02/06/2017)    Time  12    Period  Weeks    Status  New      PT LONG TERM GOAL #4   Title  pt will be able to walk/ stand for >/= 30 minutes and navigate >/= 15 steps with >/=1 HHA for functional endurance and work related activities     Time  12    Period  Weeks    Status  New            Plan - 05/27/18 1152    Clinical Impression Statement  Knee pain early AM 6/10 now 4/10.   walked 1/2 mile. Caught in traffic jam so missed last appointment.  Lfexion almost equal RT but extesnion still very stiff /contracted  Strength good as a ble to sit to stand without problem but with effort x 20 reps.     PT Treatment/Interventions  Manual techniques;Patient/family education;Therapeutic activities;Therapeutic exercise;Cryotherapy;Vasopneumatic Device;Passive range of motion;Taping;Functional mobility training;Gait training;Stair training    PT Next Visit Plan  eliptical, check gait pattern carryover, hip abduction/glut strengthening    PT Home Exercise Plan  QS,, SAQ, SLR, flex/ext stretching; clams, seated HSS, standing glut sets    Consulted and Agree with Plan of Care  Patient       Patient will benefit from skilled therapeutic intervention in order to improve the following deficits and impairments:  Pain, Decreased activity  tolerance, Decreased range of motion, Decreased strength, Difficulty walking, Increased edema  Visit Diagnosis: Localized edema  Stiffness of left knee, not elsewhere classified  Muscle weakness (generalized)     Problem List Patient Active Problem List   Diagnosis Date Noted   . Failed total knee arthroplasty ([place]) 04/21/2018  . OA (osteoarthritis) of knee 12/08/2016  . 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 Cervical facet joint syndrome 12/17/2015  . Chronic bilateral low back pain without sciatica 12/17/2015  . Primary osteoarthritis of left knee 12/17/2015    Darrel Hoover PT 05/27/2018, 1:18 PM  [facility] [address] [place], [place], [address] Phone: [phone]   Fax:  [phone]  Name: Daniel BLASS, [name] MRN: [id number] Date of Birth: 10-04-1961

## 2018-06-01 ENCOUNTER — Ambulatory Visit: Payer: 59

## 2018-06-01 DIAGNOSIS — M25662 Stiffness of left knee, not elsewhere classified: Secondary | ICD-10-CM | POA: Diagnosis not present

## 2018-06-01 DIAGNOSIS — R2689 Other abnormalities of gait and mobility: Secondary | ICD-10-CM | POA: Diagnosis not present

## 2018-06-01 DIAGNOSIS — R600 Localized edema: Secondary | ICD-10-CM | POA: Diagnosis not present

## 2018-06-01 DIAGNOSIS — M25562 Pain in left knee: Secondary | ICD-10-CM

## 2018-06-01 DIAGNOSIS — M6281 Muscle weakness (generalized): Secondary | ICD-10-CM | POA: Diagnosis not present

## 2018-06-01 NOTE — Therapy (Signed)
[place], [place], [address] Phone: [phone]   Fax:  [phone]  Physical Therapy Treatment  Patient Details  Name: Daniel SUDANO, MD MRN: [id number] Date of Birth: 09-24-1961 Referring Provider: [name], MD   Encounter Date: 06/01/2018  PT End of Session - 06/01/18 1149    Visit Number  9    Number of Visits  24    Date for PT Re-Evaluation  07/09/18    Authorization Type  UMR ,, TRICARE    PT Start Time  1148    PT Stop Time  1245    PT Time Calculation (min)  57 min    Activity Tolerance  Patient tolerated treatment well    Behavior During Therapy  [facility] for tasks assessed/performed       Past Medical History:  Diagnosis Date  . Anxiety   . Aortic atherosclerosis ([place])   . Arthritis   . BPH (benign prostatic hyperplasia)   . Bronchitis   . Chronic constipation   . DDD (degenerative disc disease), cervical   . Diabetes mellitus without complication ([place])    type 2  . Diverticulosis   . DJD (degenerative joint disease)   . Ganglion cyst    12 mm  . History of blood transfusion   . Hyperlipidemia   . Internal hemorrhoids   . Low back pain   . Migraines   . Neck pain   . Obesity   . Pneumonia   . Positive QuantiFERON-TB Gold test   . Shoulder pain   . Sinusitis     Past Surgical History:  Procedure Laterality Date  . COLONOSCOPY  2018  . Hip Resurface  2006  . MASS EXCISION Right 04/21/2018   Procedure: EXCISION RIGHT THIGH SOFT TISSUE MASS;  Surgeon: [name], MD;  [facility];  Service: Orthopedics;  Laterality: Right;  . MENISCUS REPAIR  2008, 1993  . SLAP Tear Repair  2008  . TONSILLECTOMY    . 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 TOTAL KNEE ARTHROPLASTY WITH REVISION COMPONENTS Left 04/21/2018   Procedure: Left knee  polyethylene exchange ;  Surgeon: [name], MD;  [facility];  Service: Orthopedics;  Laterality: Left;    There were no vitals filed for this visit.  Subjective Assessment - 06/01/18 1330    Subjective  Knee pain early  now 4/10.   walked 1/2 mile. walking better     Pain Score  4     Pain Location  Knee    Pain Orientation  Left    Pain Descriptors / Indicators  Sore    Pain Type  Surgical pain    Pain Frequency  Constant                       [facility] Adult PT Treatment/Exercise - 06/01/18 0001      Knee/Hip Exercises: Stretches   Other Knee/Hip Stretches  knee flexion stretch in supine      Knee/Hip Exercises: Aerobic   Stationary Bike  L5  6 min      Knee/Hip Exercises: Standing   Other Standing Knee Exercises  Lunges forward and back x 15 RT/Lt  side lunges x 15      Knee/Hip Exercises: Supine   Short Arc [employer]  Left;20 reps  20 reps 5 sec hold     Vasopneumatic   Number Minutes Vasopneumatic   15 minutes    Vasopnuematic Location   Knee    Vasopneumatic Pressure  Medium    Vasopneumatic Temperature   lowest      Manual Therapy   Joint Mobilization  Lt knee extension mobs in supine grade III, 6 bouts, 30 sec or more    Passive ROM  extension stretchin g and gastroc stretch               PT Short Term Goals - 05/04/18 1028      PT SHORT TERM GOAL #1   Title  pt will be I with initial HEP given     Status  Achieved      PT SHORT TERM GOAL #2   Title  pt will be able to verbalize and demo techniques to prevent/ reduce edema/ pain via RICE and HEP)    Status  Achieved      PT SHORT TERM GOAL #3   Title  pt will increase L knee flexion to >/= 110 degrees and extension to >/= -5 degrees for functional progression     Baseline  flexion to 115   ext still -20    Status  Partially Met        PT Long Term Goals - 04/27/18 1229      PT LONG TERM GOAL #1   Title  pt will be I with all HEP given as of last visit     Time   12    Period  Weeks    Status  New      PT LONG TERM GOAL #2   Title  pt will increase L knee flexion to >/=125 degrees and extension to >/=-2 degrees with </= 2/10 pain for functional and efficient gait pattern (02/06/2017)    Time  12    Period  Weeks    Status  New      PT LONG TERM GOAL #3   Title  pt will increase L knee strength to >/= 4/5 for prolonged walking/ standing activities to promote safety (02/06/2017)    Time  12    Period  Weeks    Status  New      PT LONG TERM GOAL #4   Title  pt will be able to walk/ stand for >/= 30 minutes and navigate >/= 15 steps with >/=1 HHA for functional endurance and work related activities     Time  12    Period  Weeks    Status  New            Plan - 06/01/18 1150    Clinical Impression Statement  Progressing nicely . walking with min limp .[name] Pain still low moderate .    Extension stiffness continue to be significantly limited.     PT Treatment/Interventions  Manual techniques;Patient/family education;Therapeutic activities;Therapeutic exercise;Cryotherapy;Vasopneumatic Device;Passive range of motion;Taping;Functional mobility training;Gait training;Stair training    PT Next Visit Plan  eliptical, check gait pattern carryover, hip abduction/glut strengthening    PT Home Exercise Plan  QS,, SAQ, SLR, flex/ext stretching; clams, seated HSS, standing glut sets    Consulted and Agree with Plan of Care  Patient       Patient will benefit from skilled therapeutic intervention in order to improve the following deficits and impairments:  Pain, Decreased activity tolerance, Decreased range of motion, Decreased strength, Difficulty walking, Increased edema  Visit Diagnosis:  Stiffness of left knee, not elsewhere classified  Muscle weakness (generalized)  Other abnormalities of gait and mobility  Acute pain of left knee     Problem List Patient Active Problem List   Diagnosis Date Noted  . Failed total knee arthroplasty ([place])  04/21/2018  . OA (osteoarthritis) of knee 12/08/2016  . 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 Cervical facet joint syndrome 12/17/2015  . Chronic bilateral low back pain without sciatica 12/17/2015  . Primary osteoarthritis of left knee 12/17/2015    Darrel Hoover  PT 06/01/2018, 1:31 PM  [facility] [address] [place], [place], [address] Phone: [phone]   Fax:  [phone]  Name: AMEAR STROJNY, MD MRN: [id number] Date of Birth: [birth year]

## 2018-06-03 ENCOUNTER — Ambulatory Visit: Payer: 59

## 2018-06-03 DIAGNOSIS — R2689 Other abnormalities of gait and mobility: Secondary | ICD-10-CM

## 2018-06-03 DIAGNOSIS — M6281 Muscle weakness (generalized): Secondary | ICD-10-CM

## 2018-06-03 DIAGNOSIS — R600 Localized edema: Secondary | ICD-10-CM | POA: Diagnosis not present

## 2018-06-03 DIAGNOSIS — M25562 Pain in left knee: Secondary | ICD-10-CM | POA: Diagnosis not present

## 2018-06-03 DIAGNOSIS — M25662 Stiffness of left knee, not elsewhere classified: Secondary | ICD-10-CM | POA: Diagnosis not present

## 2018-06-03 NOTE — Therapy (Signed)
[place], [place], [address] Phone: [phone]   Fax:  [phone]  Physical Therapy Treatment  Patient Details  Name: Daniel CHHIM, MD MRN: [id number] Date of Birth: Apr 30, 1961 Referring Provider: [name], MD   Encounter Date: 06/03/2018  PT End of Session - 06/03/18 1153    Visit Number  10    Number of Visits  24    Date for PT Re-Evaluation  07/09/18    Authorization Type  UMR ,, TRICARE    PT Start Time  4431    PT Stop Time  1240    PT Time Calculation (min)  55 min    Activity Tolerance  Patient tolerated treatment well    Behavior During Therapy  [facility] for tasks assessed/performed       Past Medical History:  Diagnosis Date  . Anxiety   . Aortic atherosclerosis ([place])   . Arthritis   . BPH (benign prostatic hyperplasia)   . Bronchitis   . Chronic constipation   . DDD (degenerative disc disease), cervical   . Diabetes mellitus without complication ([place])    type 2  . Diverticulosis   . DJD (degenerative joint disease)   . Ganglion cyst    12 mm  . History of blood transfusion   . Hyperlipidemia   . Internal hemorrhoids   . Low back pain   . Migraines   . Neck pain   . Obesity   . Pneumonia   . Positive QuantiFERON-TB Gold test   . Shoulder pain   . Sinusitis     Past Surgical History:  Procedure Laterality Date  . COLONOSCOPY  2018  . Hip Resurface  2006  . MASS EXCISION Right 04/21/2018   Procedure: EXCISION RIGHT THIGH SOFT TISSUE MASS;  Surgeon: [name], MD;  [facility];  Service: Orthopedics;  Laterality: Right;  . MENISCUS REPAIR  2008, 1993  . SLAP Tear Repair  2008  . TONSILLECTOMY    . 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 TOTAL KNEE ARTHROPLASTY WITH REVISION COMPONENTS Left 04/21/2018   Procedure: Left knee  polyethylene exchange ;  Surgeon: [name], MD;  [facility];  Service: Orthopedics;  Laterality: Left;    There were no vitals filed for this visit.  Subjective Assessment - 06/03/18 1152    Subjective  Pain 3-4 . Always sore day after PT . REsted yesterday iced due to pain    Pain Score  4     Pain Location  Knee    Pain Orientation  Left    Pain Descriptors / Indicators  Sore    Pain Type  Surgical pain    Pain Frequency  Constant         [facility] PT Assessment - 06/03/18 0001      AROM   Left Knee Extension  -10    Left Knee Flexion  130   post stretching                   [facility] Adult PT Treatment/Exercise - 06/03/18 0001      Exercises   Exercises  Knee/Hip      Knee/Hip Exercises: Aerobic   Stationary Bike  L5  6 min      Knee/Hip Exercises: Standing   Other Standing Knee Exercises  8 inch step  ups lateral LT x 20 then 3 inch step downs x 20      Knee/Hip Exercises: Seated   Long Arc Quad  Left    Long Arc Quad Weight  5 lbs.    Long Arc Quad Limitations  10 sec hold x 25       Knee/Hip Exercises: Prone   Hamstring Curl  20 reps    Hamstring Curl Limitations  5 pounds      Vasopneumatic   Number Minutes Vasopneumatic   15 minutes    Vasopnuematic Location   Knee    Vasopneumatic Pressure  Medium    Vasopneumatic Temperature   lowest      Manual Therapy   Joint Mobilization  Lt knee extension mobs with softissue stretching in prone.     Passive ROM  extension stretching but flexion emphasis today               PT Short Term Goals - 05/04/18 1028      PT SHORT TERM GOAL #1   Title  pt will be I with initial HEP given     Status  Achieved      PT SHORT TERM GOAL #2   Title  pt will be able to verbalize and demo techniques to prevent/ reduce edema/ pain via RICE and HEP)    Status  Achieved      PT SHORT TERM GOAL #3   Title  pt will increase L knee flexion to >/= 110 degrees and extension to >/= -5 degrees for  functional progression     Baseline  flexion to 115   ext still -20    Status  Partially Met        PT Long Term Goals - 04/27/18 1229      PT LONG TERM GOAL #1   Title  pt will be I with all HEP given as of last visit     Time  12    Period  Weeks    Status  New      PT LONG TERM GOAL #2   Title  pt will increase L knee flexion to >/=125 degrees and extension to >/=-2 degrees with </= 2/10 pain for functional and efficient gait pattern (02/06/2017)    Time  12    Period  Weeks    Status  New      PT LONG TERM GOAL #3   Title  pt will increase L knee strength to >/= 4/5 for prolonged walking/ standing activities to promote safety (02/06/2017)    Time  12    Period  Weeks    Status  New      PT LONG TERM GOAL #4   Title  pt will be able to walk/ stand for >/= 30 minutes and navigate >/= 15 steps with >/=1 HHA for functional endurance and work related activities     Time  12    Period  Weeks    Status  New            Plan - 06/03/18 1154    PT Treatment/Interventions  Manual techniques;Patient/family education;Therapeutic activities;Therapeutic exercise;Cryotherapy;Vasopneumatic Device;Passive range of motion;Taping;Functional mobility training;Gait training;Stair training    PT Next Visit Plan  ,, hip abduction/glut strengthening, ROM     PT Home Exercise Plan  QS,, SAQ, SLR, flex/ext stretching; clams, seated HSS, standing glut sets    Consulted and Agree with Plan of Care  Patient  Patient will benefit from skilled therapeutic intervention in order to improve the following deficits and impairments:  Pain, Decreased activity tolerance, Decreased range of motion, Decreased strength, Difficulty walking, Increased edema  Visit Diagnosis: Stiffness of left knee, not elsewhere classified  Muscle weakness (generalized)  Other abnormalities of gait and mobility     Problem List Patient Active Problem List   Diagnosis Date Noted  . Failed total knee  arthroplasty ([place]) 04/21/2018  . OA (osteoarthritis) of knee 12/08/2016  . 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 Cervical facet joint syndrome 12/17/2015  . Chronic bilateral low back pain without sciatica 12/17/2015  . Primary osteoarthritis of left knee 12/17/2015    Darrel Hoover  PT 06/03/2018, 12:40 PM  [facility] [address] [place], [place], [address] Phone: [phone]   Fax:  [phone]  Name: RIHAN SCHUELER, MD MRN: [id number] Date of Birth: 1961/08/24

## 2018-06-09 ENCOUNTER — Encounter (HOSPITAL_BASED_OUTPATIENT_CLINIC_OR_DEPARTMENT_OTHER): Payer: Self-pay | Admitting: *Deleted

## 2018-06-09 ENCOUNTER — Ambulatory Visit: Payer: Self-pay | Admitting: Orthopedic Surgery

## 2018-06-10 ENCOUNTER — Ambulatory Visit (HOSPITAL_BASED_OUTPATIENT_CLINIC_OR_DEPARTMENT_OTHER): Admit: 2018-06-10 | Payer: 59 | Admitting: Specialist

## 2018-06-10 ENCOUNTER — Encounter (HOSPITAL_BASED_OUTPATIENT_CLINIC_OR_DEPARTMENT_OTHER): Payer: Self-pay

## 2018-06-10 SURGERY — REPAIR, ROTATOR CUFF, ARTHROSCOPIC
Anesthesia: General | Laterality: Right

## 2018-06-11 ENCOUNTER — Other Ambulatory Visit: Payer: Self-pay

## 2018-06-11 ENCOUNTER — Encounter (HOSPITAL_BASED_OUTPATIENT_CLINIC_OR_DEPARTMENT_OTHER): Payer: Self-pay | Admitting: *Deleted

## 2018-06-11 NOTE — H&P (View-Only) (Signed)
Spoke w/ pt via phone for pre-op interview.  Npo after mn w/ exception clear liquids until 0700 (no cream/milk products).  Arrive at 1100.  Needs istat.  Current ekg in chart and epic.  Will take flomax am dos w/ sips of water and if needed may take prn meds.

## 2018-06-11 NOTE — Progress Notes (Signed)
Spoke w/ pt via phone for pre-op interview.  Npo after mn w/ exception clear liquids until 0700 (no cream/milk products).  Arrive at 1100.  Needs istat.  Current ekg in chart and epic.  Will take flomax am dos w/ sips of water and if needed may take prn meds.

## 2018-06-14 MED ORDER — DEXTROSE 5 % IV SOLN
3.0000 g | INTRAVENOUS | Status: AC
  Administered 2018-06-15: 3 g via INTRAVENOUS
  Filled 2018-06-14 (×3): qty 3000

## 2018-06-15 ENCOUNTER — Ambulatory Visit (HOSPITAL_BASED_OUTPATIENT_CLINIC_OR_DEPARTMENT_OTHER)
Admission: RE | Admit: 2018-06-15 | Discharge: 2018-06-15 | Disposition: A | Discharge status: Home / Self Care | Payer: 59 | Source: Ambulatory Visit | Attending: Specialist | Admitting: Specialist

## 2018-06-15 ENCOUNTER — Encounter (HOSPITAL_BASED_OUTPATIENT_CLINIC_OR_DEPARTMENT_OTHER): Payer: Self-pay | Admitting: *Deleted

## 2018-06-15 ENCOUNTER — Ambulatory Visit (HOSPITAL_BASED_OUTPATIENT_CLINIC_OR_DEPARTMENT_OTHER): Payer: 59 | Admitting: Anesthesiology

## 2018-06-15 ENCOUNTER — Encounter (HOSPITAL_BASED_OUTPATIENT_CLINIC_OR_DEPARTMENT_OTHER)
Admission: RE | Disposition: A | Discharge status: Home / Self Care | Payer: Self-pay | Source: Ambulatory Visit | Attending: Specialist

## 2018-06-15 DIAGNOSIS — M19011 Primary osteoarthritis, right shoulder: Secondary | ICD-10-CM | POA: Insufficient documentation

## 2018-06-15 DIAGNOSIS — G8918 Other acute postprocedural pain: Secondary | ICD-10-CM | POA: Diagnosis not present

## 2018-06-15 DIAGNOSIS — S46011A Strain of muscle(s) and tendon(s) of the rotator cuff of right shoulder, initial encounter: Secondary | ICD-10-CM | POA: Diagnosis not present

## 2018-06-15 DIAGNOSIS — E119 Type 2 diabetes mellitus without complications: Secondary | ICD-10-CM | POA: Diagnosis not present

## 2018-06-15 DIAGNOSIS — Z79899 Other long term (current) drug therapy: Secondary | ICD-10-CM | POA: Insufficient documentation

## 2018-06-15 DIAGNOSIS — M75101 Unspecified rotator cuff tear or rupture of right shoulder, not specified as traumatic: Secondary | ICD-10-CM | POA: Diagnosis not present

## 2018-06-15 DIAGNOSIS — E785 Hyperlipidemia, unspecified: Secondary | ICD-10-CM | POA: Insufficient documentation

## 2018-06-15 DIAGNOSIS — Z9889 Other specified postprocedural states: Secondary | ICD-10-CM

## 2018-06-15 DIAGNOSIS — S43431A Superior glenoid labrum lesion of right shoulder, initial encounter: Secondary | ICD-10-CM | POA: Diagnosis not present

## 2018-06-15 DIAGNOSIS — Z7984 Long term (current) use of oral hypoglycemic drugs: Secondary | ICD-10-CM | POA: Diagnosis not present

## 2018-06-15 DIAGNOSIS — M7541 Impingement syndrome of right shoulder: Secondary | ICD-10-CM | POA: Diagnosis not present

## 2018-06-15 DIAGNOSIS — I700 Atherosclerosis of aorta: Secondary | ICD-10-CM | POA: Diagnosis not present

## 2018-06-15 DIAGNOSIS — M75121 Complete rotator cuff tear or rupture of right shoulder, not specified as traumatic: Secondary | ICD-10-CM | POA: Diagnosis not present

## 2018-06-15 DIAGNOSIS — S4991XA Unspecified injury of right shoulder and upper arm, initial encounter: Secondary | ICD-10-CM

## 2018-06-15 HISTORY — DX: Presence of spectacles and contact lenses: Z97.3

## 2018-06-15 HISTORY — DX: Chronic pain syndrome: G89.4

## 2018-06-15 HISTORY — DX: Unspecified rotator cuff tear or rupture of right shoulder, not specified as traumatic: M75.101

## 2018-06-15 HISTORY — PX: ARTHOSCOPIC ROTAOR CUFF REPAIR: SHX5002

## 2018-06-15 HISTORY — DX: Type 2 diabetes mellitus without complications: E11.9

## 2018-06-15 HISTORY — DX: Unspecified osteoarthritis, unspecified site: M19.90

## 2018-06-15 LAB — POCT I-STAT 4, (NA,K, GLUC, HGB,HCT)
Glucose, Bld: 95 mg/dL (ref 70–99)
HCT: 45 % (ref 39.0–52.0)
Hemoglobin: 15.3 g/dL (ref 13.0–17.0)
Potassium: 3.7 mmol/L (ref 3.5–5.1)
Sodium: 145 mmol/L (ref 135–145)

## 2018-06-15 LAB — GLUCOSE, CAPILLARY: Glucose-Capillary: 109 mg/dL — ABNORMAL HIGH (ref 70–99)

## 2018-06-15 SURGERY — REPAIR, ROTATOR CUFF, ARTHROSCOPIC
Anesthesia: General | Laterality: Right

## 2018-06-15 MED ORDER — PROMETHAZINE HCL 25 MG/ML IJ SOLN
6.2500 mg | INTRAMUSCULAR | Status: DC | PRN
  Filled 2018-06-15: qty 1

## 2018-06-15 MED ORDER — FENTANYL CITRATE (PF) 100 MCG/2ML IJ SOLN
INTRAMUSCULAR | Status: DC | PRN
  Administered 2018-06-15 (×2): 50 ug via INTRAVENOUS

## 2018-06-15 MED ORDER — LIDOCAINE 2% (20 MG/ML) 5 ML SYRINGE
INTRAMUSCULAR | Status: AC
  Filled 2018-06-15: qty 5

## 2018-06-15 MED ORDER — SODIUM CHLORIDE 0.9 % IJ SOLN
INTRAMUSCULAR | Status: DC | PRN
  Administered 2018-06-15: 14:00:00 via INTRAMUSCULAR

## 2018-06-15 MED ORDER — MIDAZOLAM HCL 2 MG/2ML IJ SOLN
INTRAMUSCULAR | Status: AC
  Filled 2018-06-15: qty 2

## 2018-06-15 MED ORDER — CHLORHEXIDINE GLUCONATE 4 % EX LIQD
60.0000 mL | Freq: Once | CUTANEOUS | Status: DC
  Filled 2018-06-15: qty 118

## 2018-06-15 MED ORDER — OXYCODONE HCL 5 MG PO TABS
5.0000 mg | ORAL_TABLET | Freq: Once | ORAL | Status: AC
  Administered 2018-06-15: 5 mg via ORAL
  Filled 2018-06-15: qty 1

## 2018-06-15 MED ORDER — SODIUM CHLORIDE 0.9 % IR SOLN
Status: DC | PRN
  Administered 2018-06-15: 12000 mL

## 2018-06-15 MED ORDER — DEXAMETHASONE SODIUM PHOSPHATE 10 MG/ML IJ SOLN
INTRAMUSCULAR | Status: AC
  Filled 2018-06-15: qty 1

## 2018-06-15 MED ORDER — PROPOFOL 10 MG/ML IV BOLUS
INTRAVENOUS | Status: DC | PRN
  Administered 2018-06-15: 50 mg via INTRAVENOUS
  Administered 2018-06-15: 300 mg via INTRAVENOUS
  Administered 2018-06-15: 50 mg via INTRAVENOUS

## 2018-06-15 MED ORDER — OXYCODONE HCL 5 MG PO TABS: 5.0000 mg | ORAL_TABLET | ORAL | 0 refills | Status: AC | PRN

## 2018-06-15 MED ORDER — PROPOFOL 10 MG/ML IV BOLUS
INTRAVENOUS | Status: AC
  Filled 2018-06-15: qty 40

## 2018-06-15 MED ORDER — METHOCARBAMOL 750 MG PO TABS: 750.0000 mg | ORAL_TABLET | Freq: Three times a day (TID) | ORAL | 2 refills | Status: DC | PRN

## 2018-06-15 MED ORDER — DEXAMETHASONE SODIUM PHOSPHATE 10 MG/ML IJ SOLN
INTRAMUSCULAR | Status: DC | PRN
  Administered 2018-06-15: 10 mg via INTRAVENOUS

## 2018-06-15 MED ORDER — KETOROLAC TROMETHAMINE 30 MG/ML IJ SOLN
30.0000 mg | Freq: Once | INTRAMUSCULAR | Status: DC | PRN
  Filled 2018-06-15: qty 1

## 2018-06-15 MED ORDER — FENTANYL CITRATE (PF) 100 MCG/2ML IJ SOLN
INTRAMUSCULAR | Status: AC
  Filled 2018-06-15: qty 2

## 2018-06-15 MED ORDER — ONDANSETRON HCL 4 MG/2ML IJ SOLN
INTRAMUSCULAR | Status: AC
  Filled 2018-06-15: qty 2

## 2018-06-15 MED ORDER — MIDAZOLAM HCL 2 MG/2ML IJ SOLN
INTRAMUSCULAR | Status: DC | PRN
  Administered 2018-06-15: 2 mg via INTRAVENOUS

## 2018-06-15 MED ORDER — SUGAMMADEX SODIUM 200 MG/2ML IV SOLN
INTRAVENOUS | Status: DC | PRN
  Administered 2018-06-15 (×2): 100 mg via INTRAVENOUS

## 2018-06-15 MED ORDER — FENTANYL CITRATE (PF) 100 MCG/2ML IJ SOLN
25.0000 ug | INTRAMUSCULAR | Status: DC | PRN
  Filled 2018-06-15: qty 1

## 2018-06-15 MED ORDER — ROPIVACAINE HCL 5 MG/ML IJ SOLN
INTRAMUSCULAR | Status: DC | PRN
  Administered 2018-06-15: 30 mL via PERINEURAL

## 2018-06-15 MED ORDER — ROCURONIUM BROMIDE 10 MG/ML (PF) SYRINGE
PREFILLED_SYRINGE | INTRAVENOUS | Status: DC | PRN
  Administered 2018-06-15: 10 mg via INTRAVENOUS

## 2018-06-15 MED ORDER — SUCCINYLCHOLINE CHLORIDE 200 MG/10ML IV SOSY
PREFILLED_SYRINGE | INTRAVENOUS | Status: DC | PRN
  Administered 2018-06-15: 130 mg via INTRAVENOUS

## 2018-06-15 MED ORDER — ROCURONIUM BROMIDE 10 MG/ML (PF) SYRINGE
PREFILLED_SYRINGE | INTRAVENOUS | Status: AC
  Filled 2018-06-15: qty 10

## 2018-06-15 MED ORDER — SUGAMMADEX SODIUM 200 MG/2ML IV SOLN
INTRAVENOUS | Status: AC
  Filled 2018-06-15: qty 2

## 2018-06-15 MED ORDER — LACTATED RINGERS IV SOLN
INTRAVENOUS | Status: DC
  Administered 2018-06-15 (×2): via INTRAVENOUS
  Filled 2018-06-15: qty 1000

## 2018-06-15 MED ORDER — FENTANYL CITRATE (PF) 100 MCG/2ML IJ SOLN
100.0000 ug | Freq: Once | INTRAMUSCULAR | Status: AC
  Administered 2018-06-15: 100 ug via INTRAVENOUS
  Filled 2018-06-15: qty 2

## 2018-06-15 MED ORDER — OXYCODONE HCL 5 MG PO TABS
ORAL_TABLET | ORAL | Status: AC
  Filled 2018-06-15: qty 1

## 2018-06-15 MED ORDER — MIDAZOLAM HCL 2 MG/2ML IJ SOLN
2.0000 mg | Freq: Once | INTRAMUSCULAR | Status: AC
  Administered 2018-06-15: 2 mg via INTRAVENOUS
  Filled 2018-06-15: qty 2

## 2018-06-15 MED ORDER — BUPIVACAINE HCL (PF) 0.25 % IJ SOLN
INTRAMUSCULAR | Status: DC | PRN
  Administered 2018-06-15: 20 mL

## 2018-06-15 MED ORDER — LIDOCAINE 2% (20 MG/ML) 5 ML SYRINGE
INTRAMUSCULAR | Status: DC | PRN
  Administered 2018-06-15: 40 mg via INTRAVENOUS
  Administered 2018-06-15: 60 mg via INTRAVENOUS

## 2018-06-15 MED ORDER — CLONIDINE HCL (ANALGESIA) 100 MCG/ML EP SOLN
EPIDURAL | Status: DC | PRN
  Administered 2018-06-15: 50 ug

## 2018-06-15 MED ORDER — CEPHALEXIN 500 MG PO CAPS: 500.0000 mg | ORAL_CAPSULE | Freq: Three times a day (TID) | ORAL | 0 refills | Status: DC

## 2018-06-15 MED ORDER — SUCCINYLCHOLINE CHLORIDE 200 MG/10ML IV SOSY
PREFILLED_SYRINGE | INTRAVENOUS | Status: AC
  Filled 2018-06-15: qty 10

## 2018-06-15 MED ORDER — ONDANSETRON HCL 4 MG/2ML IJ SOLN
INTRAMUSCULAR | Status: DC | PRN
  Administered 2018-06-15: 4 mg via INTRAVENOUS

## 2018-06-15 MED FILL — CEPHALEXIN 500 MG CAPSULE: 500 | 4 days supply | Qty: 12 | Fill #0

## 2018-06-15 MED FILL — METHOCARBAMOL 750 MG TABS: 750 | 16 days supply | Qty: 50 | Fill #0

## 2018-06-15 MED FILL — oxyCODONE HCL 5 MG TABS: 5 | 6 days supply | Qty: 40 | Fill #0

## 2018-06-15 SURGICAL SUPPLY — 68 items
ANCHOR SUT BIO SW 4.75X19.1 (Anchor) ×4 IMPLANT
BLADE CUDA GRT WHITE 3.5 (BLADE) ×2 IMPLANT
BLADE GREAT WHITE 4.2 (BLADE) ×2 IMPLANT
BLADE SURG 11 STRL SS (BLADE) ×2 IMPLANT
BLADE SURG 15 STRL LF DISP TIS (BLADE) ×1 IMPLANT
BLADE SURG 15 STRL SS (BLADE) ×1
BUR 3.5 LG SPHERICAL (BURR) IMPLANT
BUR OVAL 6.0 (BURR) ×2 IMPLANT
BURR 3.5 LG SPHERICAL (BURR)
CANNULA 5.75X7 CRYSTAL CLEAR (CANNULA) IMPLANT
CANNULA 5.75X71 LONG (CANNULA) IMPLANT
CANNULA TWIST IN 8.25X7CM (CANNULA) ×4 IMPLANT
CONNECTOR 5 IN 1 STRAIGHT STRL (MISCELLANEOUS) ×2 IMPLANT
DRAPE ORTHO SPLIT 77X108 STRL (DRAPES) ×2
DRAPE POUCH INSTRU U-SHP 10X18 (DRAPES) ×2 IMPLANT
DRAPE SHEET LG 3/4 BI-LAMINATE (DRAPES) ×2 IMPLANT
DRAPE STERI 35X30 U-POUCH (DRAPES) ×2 IMPLANT
DRAPE SURG 17X23 STRL (DRAPES) ×2 IMPLANT
DRAPE SURG ORHT 6 SPLT 77X108 (DRAPES) ×2 IMPLANT
DRAPE U-SHAPE 47X51 STRL (DRAPES) ×2 IMPLANT
DURAPREP 26ML APPLICATOR (WOUND CARE) ×2 IMPLANT
ELECT MENISCUS 165MM 90D (ELECTRODE) IMPLANT
ELECT REM PT RETURN 9FT ADLT (ELECTROSURGICAL) ×2
ELECTRODE REM PT RTRN 9FT ADLT (ELECTROSURGICAL) ×1 IMPLANT
FIBERSTICK 2 (SUTURE) IMPLANT
GAUZE SPONGE 4X4 12PLY STRL (GAUZE/BANDAGES/DRESSINGS) ×2 IMPLANT
GAUZE XEROFORM 1X8 LF (GAUZE/BANDAGES/DRESSINGS) ×2 IMPLANT
GLOVE BIO SURGEON STRL SZ7.5 (GLOVE) ×2 IMPLANT
GLOVE BIO SURGEON STRL SZ8 (GLOVE) ×2 IMPLANT
GLOVE INDICATOR 8.0 STRL GRN (GLOVE) ×4 IMPLANT
GOWN STRL REUS W/TWL XL LVL3 (GOWN DISPOSABLE) ×4 IMPLANT
KIT TURNOVER CYSTO (KITS) ×2 IMPLANT
LASSO SUT 90 DEGREE (SUTURE) IMPLANT
MANIFOLD NEPTUNE II (INSTRUMENTS) IMPLANT
NEEDLE 1/2 CIR CATGUT .05X1.09 (NEEDLE) IMPLANT
NEEDLE HYPO 22GX1.5 SAFETY (NEEDLE) ×2 IMPLANT
NEEDLE SCORPION MULTI FIRE (NEEDLE) ×2 IMPLANT
NS IRRIG 500ML POUR BTL (IV SOLUTION) IMPLANT
PACK ARTHROSCOPY DSU (CUSTOM PROCEDURE TRAY) ×2 IMPLANT
PACK BASIN DAY SURGERY FS (CUSTOM PROCEDURE TRAY) ×2 IMPLANT
PAD ABD 8X10 STRL (GAUZE/BANDAGES/DRESSINGS) ×2 IMPLANT
PAD ARMBOARD 7.5X6 YLW CONV (MISCELLANEOUS) IMPLANT
PENCIL BUTTON HOLSTER BLD 10FT (ELECTRODE) IMPLANT
PROBE BIPOLAR ATHRO 135MM 90D (MISCELLANEOUS) ×4 IMPLANT
SET ARTHROSCOPY TUBING (MISCELLANEOUS) ×1
SET ARTHROSCOPY TUBING PVC (MISCELLANEOUS) ×1 IMPLANT
SLEEVE ARM SUSPENSION SYSTEM (MISCELLANEOUS) ×2 IMPLANT
SLING S3 LATERAL DISP (MISCELLANEOUS) ×2 IMPLANT
SLING ULTRA II L (ORTHOPEDIC SUPPLIES) ×2 IMPLANT
SPONGE LAP 4X18 RFD (DISPOSABLE) IMPLANT
SUT 2 FIBERLOOP 20 STRT BLUE (SUTURE)
SUT ETHILON 3 0 PS 1 (SUTURE) ×2 IMPLANT
SUT FIBERWIRE #2 38 T-5 BLUE (SUTURE)
SUT LASSO 45 DEGREE LEFT (SUTURE) IMPLANT
SUT LASSO 45D RIGHT (SUTURE) IMPLANT
SUT PDS AB 0 CT1 36 (SUTURE) IMPLANT
SUT TIGER TAPE 7 IN WHITE (SUTURE) ×2 IMPLANT
SUT VIC AB 0 CT1 36 (SUTURE) IMPLANT
SUT VIC AB 2-0 CT1 27 (SUTURE)
SUT VIC AB 2-0 CT1 TAPERPNT 27 (SUTURE) IMPLANT
SUTURE 2 FIBERLOOP 20 STRT BLU (SUTURE) IMPLANT
SUTURE FIBERWR #2 38 T-5 BLUE (SUTURE) IMPLANT
SYR CONTROL 10ML LL (SYRINGE) ×2 IMPLANT
TAPE CLOTH SURG 6X10 WHT LF (GAUZE/BANDAGES/DRESSINGS) ×2 IMPLANT
TAPE FIBER 2MM 7IN #2 BLUE (SUTURE) IMPLANT
TOWEL OR 17X24 6PK STRL BLUE (TOWEL DISPOSABLE) ×4 IMPLANT
TUBE CONNECTING 12X1/4 (SUCTIONS) ×2 IMPLANT
WATER STERILE IRR 500ML POUR (IV SOLUTION) ×2 IMPLANT

## 2018-06-15 NOTE — Anesthesia Procedure Notes (Signed)
Anesthesia Regional Block: Interscalene brachial plexus block   Pre-Anesthetic Checklist: ,, timeout performed, Correct Patient, Correct Site, Correct Laterality, Correct Procedure, Correct Position, site marked, Risks and benefits discussed,  Surgical consent,  Pre-op evaluation,  At surgeon's request and post-op pain management  Laterality: Right  Prep: chloraprep       Needles:  Injection technique: Single-shot  Needle Type: Echogenic Needle     Needle Length: 9cm      Additional Needles:   Procedures:,,,, ultrasound used (permanent image in chart),,,,  Narrative:  Start time: 06/15/2018 12:00 PM End time: 06/15/2018 12:10 PM Injection made incrementally with aspirations every 5 mL.  Performed by: Personally  Anesthesiologist: Myrtie Soman, MD  Additional Notes: Patient tolerated the procedure well without complications

## 2018-06-15 NOTE — H&P (Signed)
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   Chief Complaint: Right shoulder pain HPI: Patient presents with joint discomfort that had been persistent for several weeks now. Following a fall at work. Despite conservative treatments, the patients discomfort has not improved. Imaging was obtained. Other conservative and surgical treatments were discussed in detail. Patient wishes to proceed with surgery as consented. Denies SOB, CP, or calf pain. No Fever, chills, or nausea/ vomiting.   Past Medical History:  Diagnosis Date  . Anxiety   . Aortic atherosclerosis ([place])   . BPH (benign prostatic hyperplasia)   . Chronic pain syndrome    neck and low back  . DDD (degenerative disc disease), cervical   . Diverticulosis   . DJD (degenerative joint disease)   . Ganglion cyst    12 mm , right posterior knee  . Hyperlipidemia   . Internal hemorrhoids   . Migraines   . OA (osteoarthritis)    knees, hands, right shoulder  . Positive QuantiFERON-TB Gold test   . Shoulder pain   . Tear of right rotator cuff   . Type 2 diabetes mellitus ([name])    last A1c 5.4 on 04-13-2018 in epic (followed by pcp)  . Wears glasses     Past Surgical History:  Procedure Laterality Date  . ANAL FISSURE REPAIR    . COLONOSCOPY  05-26-2017   dr Henrene Pastor  . Hip Resurface  2006  . KNEE ARTHROSCOPY W/ MENISCAL REPAIR Bilateral right 1993; left 2010  . MASS EXCISION Right 04/21/2018   Procedure: EXCISION RIGHT THIGH SOFT TISSUE MASS;  Surgeon: [name], MD;  Location: [facility];  Service: Orthopedics;  Laterality: Right;  . SEPTOPLASTY    . SHOULDER ARTHROSCOPY WITH ROTATOR CUFF REPAIR Left   . SLAP Tear Repair  2008  . TONSILLECTOMY    . 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 TOTAL KNEE ARTHROPLASTY WITH REVISION COMPONENTS Left 04/21/2018   Procedure: Left knee polyethylene exchange  ;  Surgeon: [name], MD;  Location: [facility];  Service: Orthopedics;  Laterality: Left;    History reviewed. No pertinent family history. Social History:  reports that he has never smoked. He has never used smokeless tobacco. He reports that he drinks alcohol. He reports that he does not use drugs.  Allergies:  Allergies  Allergen Reactions  . Benzoin Compound Other (See Comments)    Blisters     Medications Prior to Admission  Medication Sig Dispense Refill  . baclofen (LIORESAL) 10 MG tablet Take 10-20 mg by mouth 2 (two) times daily as needed for muscle spasms.     . butalbital-acetaminophen-caffeine (FIORICET, ESGIC) 50-325-40 MG per tablet Take 1-2 tablets by mouth every 4 (four) hours as needed for headache.     [name] CIALIS 5 MG tablet Take 5 mg by mouth every evening.   3  . cyclobenzaprine (FLEXERIL) 10 MG tablet Take 10 mg by mouth 3 (three) times daily as needed for muscle spasms.  6  . diazepam (VALIUM) 5 MG tablet Take 5 mg by mouth 2 (two) times daily.     . fluticasone (FLONASE) 50 MCG/ACT nasal spray Place 2 sprays into both nostrils daily as needed for allergies.     [name] ketoconazole (NIZORAL) 2 % cream Apply 1 application topically 2 (two) times daily as needed for irritation.     [name]  metFORMIN (GLUCOPHAGE) 1000 MG tablet Take 1,000 mg by mouth 2 (two) times daily with a meal.    . methocarbamol (ROBAXIN) 500 MG tablet Take 1 tablet (500 mg total) by mouth every 6 (six) hours as needed for muscle spasms (use first for muscle spasms). 40 tablet 0  . OVER THE COUNTER MEDICATION Take 1 capsule by mouth 2 (two) times daily. Super Beta Prostate Supplement    . oxyCODONE (OXY IR/ROXICODONE) 5 MG immediate release tablet Take 5-10 mg by mouth every 8 (eight) hours as needed for severe pain. for pain  0  . pravastatin (PRAVACHOL) 40 MG tablet Take 40 mg by mouth every evening.   2  . pseudoephedrine (SUDAFED) 120 MG 12 hr tablet Take 120 mg by mouth daily as needed for congestion.     . rizatriptan (MAXALT-MLT) 5 MG disintegrating tablet Take 5 mg by mouth as needed for migraine.   6  . SUMAtriptan (IMITREX) 50 MG tablet Take 50 mg by mouth every 2 (two) hours as needed for migraine or headache. May repeat in 2 hours if headache persists or recurs.    . tamsulosin (FLOMAX) 0.4 MG CAPS capsule Take 0.4 mg by mouth 2 (two) times daily.     [name] topiramate (TOPAMAX) 25 MG tablet Take 25 mg by mouth at bedtime.     . traMADol (ULTRAM) 50 MG tablet Take 1-2 tablets (50-100 mg total) by mouth every 6 (six) hours as needed for moderate pain (not relieved by oxycodone). 40 tablet 0  . zolpidem (AMBIEN) 10 MG tablet Take 10 mg by mouth at bedtime as needed for sleep.    [name] amoxicillin (AMOXIL) 500 MG capsule TAKE 2000 MG BY MOUTH 1 HOUR PRIOR TO ORAL SURGERY  0  . lidocaine (LIDODERM) 5 % Place 1 patch onto the skin daily. Remove & Discard patch within 12 hours or as directed by MD    . oxymetazoline (AFRIN) 0.05 % nasal spray Place 2 sprays into both nostrils 2 (two) times daily as needed for congestion.       Results for orders placed or performed during the hospital encounter of 06/15/18 (from the past 48 hour(s))  I-STAT 4, (NA,K, GLUC, HGB,HCT)     Status: None   Collection Time: 06/15/18 11:42 AM  Result Value Ref Range   Sodium 145 135 - 145 mmol/L   Potassium 3.7 3.5 - 5.1 mmol/L   Glucose, Bld 95 70 - 99 mg/dL   HCT 45.0 39.0 - 52.0 %   Hemoglobin 15.3 13.0 - 17.0 g/dL   No results found.  Review of Systems  Constitutional: Negative.   HENT: Negative.   Eyes: Negative.   Respiratory: Negative.   Cardiovascular: Negative.   Gastrointestinal: Negative.   Genitourinary: Negative.   Musculoskeletal: Positive for falls and joint pain.  Skin: Negative.   Neurological: Negative.   Endo/Heme/Allergies: Negative.   Psychiatric/Behavioral: Negative.     Blood pressure (!) 150/88, pulse 94, temperature 98.7 F (37.1 C), temperature source Oral, resp. rate 18, height 6'  (1.829 m), weight 132.5 kg, SpO2 97 %. Physical Exam  Constitutional: He appears well-developed.  HENT:  Head: Normocephalic.  Eyes: EOM are normal.  Neck: Normal range of motion.  Cardiovascular: Normal rate.  Respiratory: Effort normal.  Genitourinary:  Genitourinary Comments: Deferred  Skin: Skin is warm and dry.  Psychiatric: His behavior is normal.     Assessment/Plan Right shoulder RCT, biceps tear, and ACOA: Right shoulder scope as consented D/c home today  with caregiver Follow instructions Medications as directed F/u in office in 7 days  Lyrical Sowle L, PA-C 06/15/2018, 11:51 AM

## 2018-06-15 NOTE — Anesthesia Postprocedure Evaluation (Signed)
Anesthesia Post Note  Patient: Daniel Bossier, MD  Procedure(s) Performed: RIGHT SHOULDER ARTHROSCOPY, DEBRIDEMENT, SUBACROMIAL DECOMPRESSION, DISTAL CLAVICLE RESECTION, ROTATOR CUFF REPAIR (Right )     Patient location during evaluation: PACU Anesthesia Type: General and Regional Level of consciousness: awake and alert Pain management: pain level controlled Vital Signs Assessment: post-procedure vital signs reviewed and stable Respiratory status: spontaneous breathing, nonlabored ventilation, respiratory function stable and patient connected to nasal cannula oxygen Cardiovascular status: blood pressure returned to baseline and stable Postop Assessment: no apparent nausea or vomiting Anesthetic complications: no    Last Vitals:  Vitals:   06/15/18 1617 06/15/18 1650  BP: (!) 141/74 123/69  Pulse: (!) 101 (!) 105  Resp: (!) 9 16  Temp:  36.4 C  SpO2: 93% 91%    Last Pain:  Vitals:   06/15/18 1650  TempSrc: Axillary  PainSc: 0-No pain                 Ryan P Ellender

## 2018-06-15 NOTE — Anesthesia Preprocedure Evaluation (Addendum)
Anesthesia Evaluation  Patient identified by MRN, date of birth, ID band Patient awake    Reviewed: Allergy & Precautions, NPO status , Patient's Chart, lab work & pertinent test results  Airway Mallampati: II  TM Distance: <3 FB Neck ROM: Full    Dental no notable dental hx.    Pulmonary neg pulmonary ROS,    Pulmonary exam normal breath sounds clear to auscultation       Cardiovascular negative cardio ROS Normal cardiovascular exam Rhythm:Regular Rate:Normal     Neuro/Psych negative neurological ROS  negative psych ROS   GI/Hepatic negative GI ROS, Neg liver ROS,   Endo/Other  diabetesMorbid obesity  Renal/GU negative Renal ROS  negative genitourinary   Musculoskeletal negative musculoskeletal ROS (+)   Abdominal   Peds negative pediatric ROS (+)  Hematology negative hematology ROS (+)   Anesthesia Other Findings   Reproductive/Obstetrics negative OB ROS                            Anesthesia Physical Anesthesia Plan  ASA: III  Anesthesia Plan: General   Post-op Pain Management:  Regional for Post-op pain   Induction: Intravenous  PONV Risk Score and Plan: 2 and Ondansetron, Dexamethasone, Treatment may vary due to age or medical condition and Midazolam  Airway Management Planned: Oral ETT  Additional Equipment:   Intra-op Plan:   Post-operative Plan: Extubation in OR  Informed Consent: I have reviewed the patients History and Physical, chart, labs and discussed the procedure including the risks, benefits and alternatives for the proposed anesthesia with the patient or authorized representative who has indicated his/her understanding and acceptance.   Dental advisory given  Plan Discussed with: CRNA and Surgeon  Anesthesia Plan Comments:        Anesthesia Quick Evaluation

## 2018-06-15 NOTE — Anesthesia Procedure Notes (Signed)
Procedure Name: Intubation Date/Time: 06/15/2018 12:35 PM Performed by: Murvin Natal, MD Pre-anesthesia Checklist: Patient identified, Emergency Drugs available, Suction available and Patient being monitored Patient Re-evaluated:Patient Re-evaluated prior to induction Oxygen Delivery Method: Circle system utilized Preoxygenation: Pre-oxygenation with 100% oxygen Induction Type: IV induction Ventilation: Mask ventilation without difficulty Laryngoscope Size: Mac and 4 Grade View: Grade I Tube type: Oral Tube size: 8.0 mm Number of attempts: 1 Airway Equipment and Method: Stylet and Oral airway Placement Confirmation: ETT inserted through vocal cords under direct vision,  positive ETCO2 and breath sounds checked- equal and bilateral Secured at: 24 (at lip) cm Tube secured with: Tape Dental Injury: Teeth and Oropharynx as per pre-operative assessment

## 2018-06-15 NOTE — Anesthesia Procedure Notes (Signed)
Anesthesia Procedure Image    

## 2018-06-15 NOTE — Transfer of Care (Signed)
  Last Vitals:  Vitals Value Taken Time  BP 121/61 06/15/2018  2:15 PM  Temp 36.4 C 06/15/2018  2:10 PM  Pulse 107 06/15/2018  2:17 PM  Resp 29 06/15/2018  2:17 PM  SpO2 91 % 06/15/2018  2:17 PM  Vitals shown include unvalidated device data.  Last Pain:  Vitals:   06/15/18 1410  TempSrc:   PainSc: 0-No pain         Immediate Anesthesia Transfer of Care Note  Patient: Daniel Bossier, MD  Procedure(s) Performed: Procedure(s) (LRB): RIGHT SHOULDER ARTHROSCOPY, DEBRIDEMENT, SUBACROMIAL DECOMPRESSION, DISTAL CLAVICLE RESECTION, ROTATOR CUFF REPAIR (Right)  Patient Location: PACU  Anesthesia Type: General  Level of Consciousness: awake, alert  and oriented  Airway & Oxygen Therapy: Patient Spontanous Breathing and Patient connected to nasal cannula oxygen  Post-op Assessment: Report given to PACU RN and Post -op Vital signs reviewed and stable  Post vital signs: Reviewed and stable  Complications: No apparent anesthesia complications

## 2018-06-15 NOTE — Discharge Instructions (Signed)

## 2018-06-15 NOTE — Interval H&P Note (Signed)
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 After consideration of risks, benefits and other options for treatment, the patient has consented to  Procedure(s): RIGHT SHOULDER ARTHROSCOPY, DEBRIDEMENT, SUBACROMIAL DECOMPRESSION, DISTAL CLAVICLE RESECTION, ROTATOR CUFF REPAIR, POSSIBLE BICEPS TENODESIS (Right) as a surgical intervention .  The patient's history has been reviewed, patient examined, no change in status, stable for surgery.  I have reviewed the patient's chart and labs.  Questions were answered to the patient's satisfaction.     Indigo Barbian ANDREW

## 2018-06-15 NOTE — Op Note (Signed)
NAME: [name], [name] MEDICAL RECORD OI:[id number] ACCOUNT [id number] DATE OF BIRTH:1961/06/12 FACILITY: [facility] LOCATION: [facility] PHYSICIAN:Jadae Steinke Gwinda Passe, MD  OPERATIVE REPORT  DATE OF PROCEDURE:  06/15/2018  PREOPERATIVE DIAGNOSIS:  Right shoulder probable labral tear, possible biceps pathology.  ACOA impingement and rotator cuff tear.  POSTOPERATIVE DIAGNOSES: 1.  Right shoulder type 1 superior labral tearing. 2.  Full thickness rotator cuff tear, supraspinatus. 3.  Impingement. 4.  Acromioclavicular arthritis.  PROCEDURE: 1.  Right shoulder glenohumeral arthroscopy with labral debridement. 2.  Arthroscopic subacromial decompression with acromioplasty, bursectomy, CA ligament release. 3.  Arthroscopic distal clavicle resection, Mumford procedure. 4.  Arthroscopic rotator cuff repair.  SURGEON:  [name], MD  ASSISTANT:  Wyatt Portela PA-C.  ANESTHESIA:  Interscalene block, general.  ESTIMATED BLOOD LOSS:  Minimal.  DRAINS:  None.  COMPLICATIONS:  None.  DISPOSITION:  PACU stable.  DESCRIPTION OF PROCEDURE:  The patient's family counseled in the holding area.  The correct site was identified, marked and signed appropriately.  IV started, antibiotics given and block was administered.  He was taken to the operating room and placed in  supine position.  General anesthesia.  Gently turned to a left lateral decubitus position, properly padded and a bump.  I note that the patient has an artificial knee and artificial hip and we were extremely cautious with his body positioning and  padding.  Due to his body habitus, he was positioned and comforted in multiple areas where he is well aware where it was well padded.  Right shoulder revealed full range of motion stable.  He was then prepped with DuraPrep and draped in sterile fashion.   We utilized to elevate shoulder position 30 degrees, adduction 10 degrees forward flexion and 20 pounds longitudinal traction.  A  time-out done confirming the right side.  Posterior portal created and scope placed in the glenohumeral joint.  Diagnostic  arthroscopy revealed biceps to be intact, but there was type 1 labral tearing superiorly.  There was a full thickness rotator cuff tear of the supraspinatus at the greater tuberosity.  A lateral portal was established.  Neurovascular structures were  protected including axillary nerve.  The rotator cuff was debrided back to healthy tissue.  Soft tissue was taken off the greater tuberosity.  A bur was utilized to perform a slight tuberoplasty with increased healing response.  A shaver was introduced.   The labrum was debrided nicely and then smoothed down with the cautery system.  The biceps labral anchor was stable.  Articular cartilage appeared to be healthy.  The glenohumeral ligaments were normal.  Subacromial revealed thick subacromial subdeltoid  bursa.  A subacromial-subdeltoid bursectomy was performed with a shaver.  Cautery was instituted ____ CA ligament.  Bur was then placed posteriorly.  Anterior inferior lateral acromioplasty was performed converting to a flat acromial morphology.  AC  joint was found to be unremarkable ____ subclavicular spur.  Accessory anterior portals made anteriorly.  A bur was introduced and the lateral 5-8 mm of the clavicle was removed circumferentially back to healthy bone leaving the superior capsule intact.   Clavicles were palpated and found to be stable with debris removed and hemostasis was obtained.  Attention to the rotator cuff, small puncture was made superiorly.  An Arthrex Bio[employer]posite anchor was placed into the greater tuberosity.  Four limbs of  suture placed through the rotator cuff supraspinatus.  Arm was then abducted with SwiveLock for suture bridge double row technique.  There was also a small amount of  tissue posteriorly and the suture out of the anchor was then utilized to repair the  layer of tissue and sutured down nicely.   There were no other abnormalities noted.  Irrigated and arthroscope was removed and taken out of traction carefully.  Portals closed with 1 suture.  Another 10 mL of Sensorcaine placed in skin and subacromial  region.  Sterile dressing was applied to the shoulder, turned supine and awakened and taken to the operating PACU in stable condition.  To be stabilized in PACU and discharged home.  For help with patient position, prepping and draping, technical and surgical assistance for the entire case, wound closure subcutaneous dressing and sling, suture management and wound closure, Mr.  Shella Spearing, PA-C, assistance was needed.  TN/NUANCE  D:06/15/2018 T:06/15/2018 JOB:[id number]

## 2018-06-15 NOTE — Op Note (Signed)
XAJOINOM#[id number]

## 2018-06-16 ENCOUNTER — Encounter (HOSPITAL_BASED_OUTPATIENT_CLINIC_OR_DEPARTMENT_OTHER): Payer: Self-pay | Admitting: Specialist

## 2018-06-21 ENCOUNTER — Ambulatory Visit: Payer: 59 | Admitting: Physical Therapy

## 2018-06-22 MED FILL — TAMSULOSIN HCL 0.4 MG CAP: 0.4 | 90 days supply | Qty: 180 | Fill #1

## 2018-06-22 MED FILL — TADALAFIL 5 MG TABS: 5 | 30 days supply | Qty: 30 | Fill #4

## 2018-06-22 MED FILL — diazePAM 5 MG TABS: 5 | 30 days supply | Qty: 60 | Fill #1

## 2018-06-22 MED FILL — CELECOXIB 200 MG CAP: 200 | 90 days supply | Qty: 90 | Fill #1

## 2018-06-23 ENCOUNTER — Ambulatory Visit: Payer: 59 | Admitting: Physical Therapy

## 2018-06-23 DIAGNOSIS — M19011 Primary osteoarthritis, right shoulder: Secondary | ICD-10-CM | POA: Diagnosis not present

## 2018-06-23 DIAGNOSIS — Z4789 Encounter for other orthopedic aftercare: Secondary | ICD-10-CM | POA: Diagnosis not present

## 2018-06-23 DIAGNOSIS — S46011D Strain of muscle(s) and tendon(s) of the rotator cuff of right shoulder, subsequent encounter: Secondary | ICD-10-CM | POA: Diagnosis not present

## 2018-06-23 MED FILL — BUTALB-ACETAMIN-CAFF 50-325: 50-325-40 | 10 days supply | Qty: 60 | Fill #0

## 2018-06-23 MED FILL — RIZATRIPTAN 5 MG ODT: 5 | 30 days supply | Qty: 15 | Fill #0

## 2018-06-28 ENCOUNTER — Ambulatory Visit: Payer: 59 | Attending: Student | Admitting: Physical Therapy

## 2018-06-28 ENCOUNTER — Encounter: Payer: Self-pay | Admitting: Physical Therapy

## 2018-06-28 DIAGNOSIS — M25662 Stiffness of left knee, not elsewhere classified: Secondary | ICD-10-CM | POA: Diagnosis not present

## 2018-06-28 DIAGNOSIS — M6281 Muscle weakness (generalized): Secondary | ICD-10-CM | POA: Insufficient documentation

## 2018-06-28 DIAGNOSIS — M25611 Stiffness of right shoulder, not elsewhere classified: Secondary | ICD-10-CM | POA: Insufficient documentation

## 2018-06-28 DIAGNOSIS — R2689 Other abnormalities of gait and mobility: Secondary | ICD-10-CM | POA: Diagnosis not present

## 2018-06-28 DIAGNOSIS — M25562 Pain in left knee: Secondary | ICD-10-CM | POA: Diagnosis not present

## 2018-06-28 DIAGNOSIS — R600 Localized edema: Secondary | ICD-10-CM | POA: Diagnosis not present

## 2018-06-28 DIAGNOSIS — M25511 Pain in right shoulder: Secondary | ICD-10-CM | POA: Insufficient documentation

## 2018-06-28 MED FILL — METHOCARBAMOL 500 MG TABS: 500 | 90 days supply | Qty: 270 | Fill #1

## 2018-06-28 MED FILL — oxyCODONE HCL 5 MG TABS: 5 | 6 days supply | Qty: 40 | Fill #0

## 2018-06-28 NOTE — Therapy (Signed)
[place], [place], [address] Phone: [phone]   Fax:  [phone]  Physical Therapy Evaluation  Patient Details  Name: HAREL REPETTO, MD MRN: [id number] Date of Birth: [birth year] Referring Provider: Dr [name]    Encounter Date: 06/28/2018  PT End of Session - 06/28/18 1201    Visit Number  11   1st treatment for shoulder    Number of Visits  27    Date for PT Re-Evaluation  08/23/18    Authorization Type   04/11/1600 recert for knee; 09/32/3557 for recert on shoulder     PT Start Time  1200    PT Stop Time  1237    PT Time Calculation (min)  37 min    Activity Tolerance  Patient tolerated treatment well    Behavior During Therapy  [facility] for tasks assessed/performed       Past Medical History:  Diagnosis Date  . Anxiety   . Aortic atherosclerosis ([place])   . BPH (benign prostatic hyperplasia)   . Chronic pain syndrome    neck and low back  . DDD (degenerative disc disease), cervical   . Diverticulosis   . DJD (degenerative joint disease)   . Ganglion cyst    12 mm , right posterior knee  . Hyperlipidemia   . Internal hemorrhoids   . Migraines   . OA (osteoarthritis)    knees, hands, right shoulder  . Positive QuantiFERON-TB Gold test   . Shoulder pain   . Tear of right rotator cuff   . Type 2 diabetes mellitus ([place])    last A1c 5.4 on 04-13-2018 in epic (followed by pcp)  . Wears glasses     Past Surgical History:  Procedure Laterality Date  . ANAL FISSURE REPAIR    . ARTHOSCOPIC ROTAOR CUFF REPAIR Right 06/15/2018   Procedure: RIGHT SHOULDER ARTHROSCOPY, DEBRIDEMENT, SUBACROMIAL DECOMPRESSION, DISTAL CLAVICLE RESECTION, ROTATOR CUFF REPAIR;  Surgeon: [name], MD;  Location: [place];  Service: Orthopedics;  Laterality: Right;  . COLONOSCOPY  05-26-2017   dr Henrene Pastor  . Hip Resurface  2006  . KNEE ARTHROSCOPY W/ MENISCAL REPAIR Bilateral right 1993; left  2010  . MASS EXCISION Right 04/21/2018   Procedure: EXCISION RIGHT THIGH SOFT TISSUE MASS;  Surgeon: [name], MD;  Location[facility];  Service: Orthopedics;  Laterality: Right;  . SEPTOPLASTY    . SHOULDER ARTHROSCOPY WITH ROTATOR CUFF REPAIR Left   . SLAP Tear Repair  2008  . TONSILLECTOMY    . 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 TOTAL KNEE ARTHROPLASTY WITH REVISION COMPONENTS Left 04/21/2018   Procedure: Left knee polyethylene exchange ;  Surgeon: [name], MD;  Location: [facility];  Service: Orthopedics;  Laterality: Left;    There were no vitals filed for this visit.   Subjective Assessment - 06/28/18 1203    Subjective  Patient had a right RTC repair and DCE on 06/15/2018. Since that point he has been expieriencing expected pain. He is having some trouble with sleeping at night. He is right handed.     Diagnostic tests  Nothing post op     Patient Stated Goals  Retrun to normal     Currently in Pain?  Yes    Pain Score  7     Pain Location  Shoulder  Pain Orientation  Right    Pain Descriptors / Indicators  Aching    Pain Type  Surgical pain    Pain Onset  1 to 4 weeks ago    Pain Frequency  Constant    Aggravating Factors   use of the arm     Pain Relieving Factors  use of the arm     Effect of Pain on Daily Activities  difficulty using dominat arm          [facility] PT Assessment - 06/28/18 0001      Assessment   Medical Diagnosis  Right RTC repair     Referring Provider  Dr [name]     Onset Date/Surgical Date  06/15/18    Hand Dominance  Right    Next MD Visit  07/21/2018     Prior Therapy  Nothing       Precautions   Precautions  Shoulder    Precaution Comments  shoulder protocol given by MD       Restrictions   Weight Bearing Restrictions  No      Balance Screen   Has the patient fallen in the past 6 months   No    Has the patient had a decrease in activity level because of a fear of falling?   No    Is the patient reluctant to leave their home because of a fear of falling?   No      Home Environment   Additional Comments  Nothing significant       Prior Function   Level of Independence  Independent    Vocation  Full time employment    Leisure  going to the gym; [employer] training;       Cognition   Overall Cognitive Status  Within Functional Limits for tasks assessed    Attention  Focused    Focused Attention  Appears intact    Memory  Appears intact    Awareness  Appears intact    Problem Solving  Appears intact      Observation/Other Assessments   Skin Integrity  well healed ports     Focus on Therapeutic Outcomes (FOTO)   already has FOTO for knee       Sensation   Light Touch  Appears Intact    Additional Comments  denies parathesias       Coordination   Gross Motor Movements are Fluid and Coordinated  Yes    Fine Motor Movements are Fluid and Coordinated  Yes      ROM / Strength   AROM / PROM / Strength  AROM;PROM;Strength      AROM   Overall AROM Comments  shoulder active range not measured       PROM   PROM Assessment Site  Shoulder    Right/Left Shoulder  Right    Right Shoulder Flexion  98 Degrees    Right Shoulder Internal Rotation  60 Degrees    Right Shoulder External Rotation  23 Degrees      Strength   Overall Strength Comments  shoulder strength not tested 2nd to recent surgery and protocol       Palpation   Palpation comment  No unexpected tenderness to palpation                 Objective measurements completed on examination: See above findings.      [facility] Adult PT Treatment/Exercise - 06/28/18 0001      Shoulder Exercises:  Seated   Other Seated Exercises  towel squeeze x10      Shoulder Exercises: Standing   Other Standing Exercises  pendulums CW, CCW forward/back, side to side      Manual Therapy   Passive ROM  genlte PROM  into flexion, IR and ER              PT Education - 06/28/18 1349    Education Details  reviewed protocol; reviewed protection of the shoulder     Person(s) Educated  Patient    Methods  Explanation;Demonstration;Tactile cues;Verbal cues    Comprehension  Verbalized understanding;Verbal cues required;Returned demonstration;Tactile cues required       PT Short Term Goals - 06/28/18 1359      PT SHORT TERM GOAL #1   Title  pt will be I with initial HEP given     Time  4    Period  Weeks    Status  Achieved      PT SHORT TERM GOAL #2   Title  pt will be able to verbalize and demo techniques to prevent/ reduce edema/ pain via RICE and HEP)    Time  4    Period  Weeks    Status  Achieved      PT SHORT TERM GOAL #3   Title  pt will increase L knee flexion to >/= 110 degrees and extension to >/= -5 degrees for functional progression     Baseline  flexion to 115   ext still -20    Time  4    Period  Weeks    Status  Partially Met      PT SHORT TERM GOAL #4   Title  pt will be able to stand/ ambulate >/= 15 min with </= 5/10 pain with LRAD to promote functional mobililty (01/12/2017    Time  4    Period  Weeks    Status  On-going      PT SHORT TERM GOAL #5   Title  Patient will increase passive right shoulder passive ER 40 percent per protocol     Time  3    Period  Weeks    Status  New    Target Date  07/19/18      Additional Short Term Goals   Additional Short Term Goals  Yes      PT SHORT TERM GOAL #6   Title  Patient will increase right shoulder flexion by 25 degrees per protocol     Time  3    Period  Weeks    Status  New        PT Long Term Goals - 06/28/18 1400      PT LONG TERM GOAL #1   Title  pt will be I with all HEP given as of last visit     Time  12    Period  Weeks    Status  On-going      PT LONG TERM GOAL #2   Title  pt will increase L knee flexion to >/=125 degrees and extension to >/=-2 degrees with </= 2/10 pain for functional and  efficient gait pattern (02/06/2017)    Time  12    Period  Weeks    Status  On-going      PT LONG TERM GOAL #3   Title  pt will increase L knee strength to >/= 4/5 for prolonged walking/ standing activities to promote safety (02/06/2017)    Time  12    Period  Weeks    Status  On-going      PT LONG TERM GOAL #4   Title  pt will be able to walk/ stand for >/= 30 minutes and navigate >/= 15 steps with >/=1 HHA for functional endurance and work related activities     Time  12    Period  Weeks    Status  On-going      PT LONG TERM GOAL #5   Title  He will increase FOTO score by >/= 42% limited to demonstrate improvement in functionat Discharge (02/06/2017)    Time  8    Period  Weeks    Status  On-going      Additional Long Term Goals   Additional Long Term Goals  Yes      PT LONG TERM GOAL #6   Title  Patient will demsotrate 4+/5 grossright shoulder strength in order to perfrom ADL's     Time  8    Period  Weeks    Status  New    Target Date  08/23/18      PT LONG TERM GOAL #7   Title  Patient will demsotrate WNL active ER and IR on the right wihtout pain in order to perform ADL's     Time  8    Period  Weeks    Status  New    Target Date  08/23/18             Plan - 06/28/18 1353    Clinical Impression Statement  Patient is a 57 year old amle S/P R RTC repair with DCE on 06/15/2018. He presents with expected limitations in strength, motion, and function. His pain is well controled. He would benefit from skilled therapy to improve motion and strength. Therapy will continue to work with him for his knee as well.     History and Personal Factors relevant to plan of care:  Left TKA     Clinical Presentation  Stable    Clinical Presentation due to:  normal presentation after RTC repair.     Clinical Decision Making  Low    PT Frequency  2x / week    PT Duration  6 weeks    PT Treatment/Interventions  Manual techniques;Patient/family education;Therapeutic  activities;Therapeutic exercise;Cryotherapy;Vasopneumatic Device;Passive range of motion;Taping;Functional mobility training;Gait training;Stair training;Neuromuscular re-education;Moist Heat    PT Next Visit Plan  follow protocol for RTC frepair from Dr Theda Sers.     PT Home Exercise Plan  pendulums, towel squeeze     Consulted and Agree with Plan of Care  Patient       Patient will benefit from skilled therapeutic intervention in order to improve the following deficits and impairments:  Pain, Decreased activity tolerance, Decreased range of motion, Decreased strength, Difficulty walking, Increased edema, Impaired UE functional use, Decreased endurance  Visit Diagnosis: Stiffness of right shoulder, not elsewhere classified  Acute pain of right shoulder  Stiffness of left knee, not elsewhere classified  Muscle weakness (generalized)  Other abnormalities of gait and mobility  Acute pain of left knee  Localized edema     Problem List Patient Active Problem List   Diagnosis Date Noted  . Right shoulder injury 06/15/2018  . S/P arthroscopy of right shoulder 06/15/2018  . Failed total knee arthroplasty ([place]) 04/21/2018  . OA (osteoarthritis) of knee 12/08/2016  . 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 Cervical facet joint syndrome 12/17/2015  .  Chronic bilateral low back pain without sciatica 12/17/2015  . Primary osteoarthritis of left knee 12/17/2015    Carney Living PT DPT  06/28/2018, 2:15 PM  [facility] [address] [place], [place], [address] Phone: [phone]   Fax:  [phone]  Name: [place] PECHACEK, MD MRN: [id number] Date of Birth: [birth year]

## 2018-06-29 MED FILL — ZOLPIDEM TARTRATE 10 MG TAB: 10 | 90 days supply | Qty: 90 | Fill #0

## 2018-06-30 ENCOUNTER — Ambulatory Visit: Payer: 59 | Admitting: Physical Therapy

## 2018-06-30 ENCOUNTER — Encounter: Payer: Self-pay | Admitting: Physical Therapy

## 2018-06-30 DIAGNOSIS — M25662 Stiffness of left knee, not elsewhere classified: Secondary | ICD-10-CM | POA: Diagnosis not present

## 2018-06-30 DIAGNOSIS — R2689 Other abnormalities of gait and mobility: Secondary | ICD-10-CM | POA: Diagnosis not present

## 2018-06-30 DIAGNOSIS — R600 Localized edema: Secondary | ICD-10-CM | POA: Diagnosis not present

## 2018-06-30 DIAGNOSIS — M6281 Muscle weakness (generalized): Secondary | ICD-10-CM | POA: Diagnosis not present

## 2018-06-30 DIAGNOSIS — M25611 Stiffness of right shoulder, not elsewhere classified: Secondary | ICD-10-CM | POA: Diagnosis not present

## 2018-06-30 DIAGNOSIS — M25511 Pain in right shoulder: Secondary | ICD-10-CM | POA: Diagnosis not present

## 2018-06-30 DIAGNOSIS — M25562 Pain in left knee: Secondary | ICD-10-CM | POA: Diagnosis not present

## 2018-06-30 NOTE — Therapy (Signed)
[place], [place], [address] Phone: [phone]   Fax:  [phone]  Physical Therapy Treatment  Patient Details  Name: Daniel PATMON, [name] MRN: [id number] Date of Birth: 1961-07-03 Referring Provider: Dr [name]    Encounter Date: 06/30/2018  PT End of Session - 06/30/18 1315    Visit Number  2    Number of Visits  27    Date for PT Re-Evaluation  08/23/18    PT Start Time  1145    PT Stop Time  1230    PT Time Calculation (min)  45 min    Activity Tolerance  Patient tolerated treatment well    Behavior During Therapy  [facility] for tasks assessed/performed       Past Medical History:  Diagnosis Date  . Anxiety   . Aortic atherosclerosis ([place])   . BPH (benign prostatic hyperplasia)   . Chronic pain syndrome    neck and low back  . DDD (degenerative disc disease), cervical   . Diverticulosis   . DJD (degenerative joint disease)   . Ganglion cyst    12 mm , right posterior knee  . Hyperlipidemia   . Internal hemorrhoids   . Migraines   . OA (osteoarthritis)    knees, hands, right shoulder  . Positive QuantiFERON-TB Gold test   . Shoulder pain   . Tear of right rotator cuff   . Type 2 diabetes mellitus ([place])    last A1c 5.4 on 04-13-2018 in epic (followed by pcp)  . Wears glasses     Past Surgical History:  Procedure Laterality Date  . ANAL FISSURE REPAIR    . ARTHOSCOPIC ROTAOR CUFF REPAIR Right 06/15/2018   Procedure: RIGHT SHOULDER ARTHROSCOPY, DEBRIDEMENT, SUBACROMIAL DECOMPRESSION, DISTAL CLAVICLE RESECTION, ROTATOR CUFF REPAIR;  Surgeon: [name], [name];  Location: [place];  Service: Orthopedics;  Laterality: Right;  . COLONOSCOPY  05-26-2017   dr Henrene Pastor  . Hip Resurface  2006  . KNEE ARTHROSCOPY W/ MENISCAL REPAIR Bilateral right 1993; left 2010  . MASS EXCISION Right 04/21/2018   Procedure: EXCISION RIGHT THIGH SOFT TISSUE MASS;  Surgeon: [name], [name];   [facility];  Service: Orthopedics;  Laterality: Right;  . SEPTOPLASTY    . SHOULDER ARTHROSCOPY WITH ROTATOR CUFF REPAIR Left   . SLAP Tear Repair  2008  . TONSILLECTOMY    . TOTAL KNEE ARTHROPLASTY Left 12/08/2016   Procedure: LEFT TOTAL KNEE ARTHROPLASTY;  Surgeon: [name], [name];  [facility];  Service: Orthopedics;  Laterality: Left;  . TOTAL KNEE ARTHROPLASTY WITH REVISION COMPONENTS Left 04/21/2018   Procedure: Left knee polyethylene exchange ;  Surgeon: [name], [name];  Location[facility];  Service: Orthopedics;  Laterality: Left;    There were no vitals filed for this visit.  Subjective Assessment - 06/30/18 1146    Subjective  Sore today    Currently in Pain?  Yes    Pain Score  5     Pain Location  Shoulder    Pain Orientation  Right    Pain Descriptors / Indicators  Sore                       [facility] Adult PT Treatment/Exercise - 06/30/18 0001      Exercises   Exercises  Shoulder  (Pended)       Shoulder Exercises: Seated   Retraction Limitations  arm resting on pillow  (Pended)  Other Seated Exercises  biceps curls unweighted  (Pended)     Other Seated Exercises  pronation/supination & wrist flx/ext with therabar  (Pended)       Shoulder Exercises: Isometric Strengthening   Other Isometric Exercises  6-way 10x3s 2 finger resist by PT  (Pended)       Manual Therapy   Passive ROM  PROM per protocol  (Pended)                PT Short Term Goals - 06/28/18 1359      PT SHORT TERM GOAL #1   Title  pt will be I with initial HEP given     Time  4    Period  Weeks    Status  Achieved      PT SHORT TERM GOAL #2   Title  pt will be able to verbalize and demo techniques to prevent/ reduce edema/ pain via RICE and HEP)    Time  4    Period  Weeks    Status  Achieved      PT SHORT TERM GOAL #3   Title  pt will increase L knee flexion to >/= 110 degrees and extension to >/= -5 degrees for functional progression     Baseline   flexion to 115   ext still -20    Time  4    Period  Weeks    Status  Partially Met      PT SHORT TERM GOAL #4   Title  pt will be able to stand/ ambulate >/= 15 min with </= 5/10 pain with LRAD to promote functional mobililty (01/12/2017    Time  4    Period  Weeks    Status  On-going      PT SHORT TERM GOAL #5   Title  Patient will increase passive right shoulder passive ER 40 percent per protocol     Time  3    Period  Weeks    Status  New    Target Date  07/19/18      Additional Short Term Goals   Additional Short Term Goals  Yes      PT SHORT TERM GOAL #6   Title  Patient will increase right shoulder flexion by 25 degrees per protocol     Time  3    Period  Weeks    Status  New        PT Long Term Goals - 06/28/18 1400      PT LONG TERM GOAL #1   Title  pt will be I with all HEP given as of last visit     Time  12    Period  Weeks    Status  On-going      PT LONG TERM GOAL #2   Title  pt will increase L knee flexion to >/=125 degrees and extension to >/=-2 degrees with </= 2/10 pain for functional and efficient gait pattern (02/06/2017)    Time  12    Period  Weeks    Status  On-going      PT LONG TERM GOAL #3   Title  pt will increase L knee strength to >/= 4/5 for prolonged walking/ standing activities to promote safety (02/06/2017)    Time  12    Period  Weeks    Status  On-going      PT LONG TERM GOAL #4   Title  pt will be able to walk/  stand for >/= 30 minutes and navigate >/= 15 steps with >/=1 HHA for functional endurance and work related activities     Time  12    Period  Weeks    Status  On-going      PT LONG TERM GOAL #5   Title  He will increase FOTO score by >/= 42% limited to demonstrate improvement in functionat Discharge (02/06/2017)    Time  8    Period  Weeks    Status  On-going      Additional Long Term Goals   Additional Long Term Goals  Yes      PT LONG TERM GOAL #6   Title  Patient will demsotrate 4+/5 grossright shoulder  strength in order to perfrom ADL's     Time  8    Period  Weeks    Status  New    Target Date  08/23/18      PT LONG TERM GOAL #7   Title  Patient will demsotrate WNL active ER and IR on the right wihtout pain in order to perform ADL's     Time  8    Period  Weeks    Status  New    Target Date  08/23/18            Plan - 06/30/18 1316    Clinical Impression Statement  pt progressing appropriately through protocol. Encouraged him to continue with LE exercises as he feels safe with balance and not using his Rt arm.     PT Treatment/Interventions  Manual techniques;Patient/family education;Therapeutic activities;Therapeutic exercise;Cryotherapy;Vasopneumatic Device;Passive range of motion;Taping;Functional mobility training;Gait training;Stair training;Neuromuscular re-education;Moist Heat    PT Next Visit Plan  follow protocol for RTC frepair from Dr Theda Sers.     PT Home Exercise Plan  pendulums, towel squeeze     Consulted and Agree with Plan of Care  Patient       Patient will benefit from skilled therapeutic intervention in order to improve the following deficits and impairments:  Pain, Decreased activity tolerance, Decreased range of motion, Decreased strength, Difficulty walking, Increased edema, Impaired UE functional use, Decreased endurance  Visit Diagnosis: Stiffness of right shoulder, not elsewhere classified  Acute pain of right shoulder     Problem List Patient Active Problem List   Diagnosis Date Noted  . Right shoulder injury 06/15/2018  . S/P arthroscopy of right shoulder 06/15/2018  . Failed total knee arthroplasty ([place]) 04/21/2018  . OA (osteoarthritis) of knee 12/08/2016  . 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 Cervical facet joint syndrome 12/17/2015  . Chronic bilateral low back pain without sciatica 12/17/2015  . Primary osteoarthritis of left knee 12/17/2015    C.  PT, DPT 06/30/18  1:19 PM  [facility] [address] [place], [place], [address] Phone: [phone]   Fax:  [phone]  Name: Daniel BARG, [name] MRN: [id number] Date of Birth: 05/20/1961

## 2018-07-02 ENCOUNTER — Encounter: Payer: Self-pay | Admitting: Physical Therapy

## 2018-07-02 ENCOUNTER — Ambulatory Visit: Payer: 59 | Admitting: Physical Therapy

## 2018-07-02 DIAGNOSIS — M25611 Stiffness of right shoulder, not elsewhere classified: Secondary | ICD-10-CM

## 2018-07-02 DIAGNOSIS — R2689 Other abnormalities of gait and mobility: Secondary | ICD-10-CM | POA: Diagnosis not present

## 2018-07-02 DIAGNOSIS — M25511 Pain in right shoulder: Secondary | ICD-10-CM | POA: Diagnosis not present

## 2018-07-02 DIAGNOSIS — M25562 Pain in left knee: Secondary | ICD-10-CM | POA: Diagnosis not present

## 2018-07-02 DIAGNOSIS — M6281 Muscle weakness (generalized): Secondary | ICD-10-CM | POA: Diagnosis not present

## 2018-07-02 DIAGNOSIS — R600 Localized edema: Secondary | ICD-10-CM | POA: Diagnosis not present

## 2018-07-02 DIAGNOSIS — M25662 Stiffness of left knee, not elsewhere classified: Secondary | ICD-10-CM | POA: Diagnosis not present

## 2018-07-02 NOTE — Therapy (Signed)
[place], [place], [address] Phone: [phone]   Fax:  [phone]  Physical Therapy Treatment  Patient Details  Name: Daniel LIFORD, [name] MRN: [id number] Date of Birth: [birth year] Referring Provider: Dr [name]    Encounter Date: 07/02/2018  PT End of Session - 07/02/18 1117    Visit Number  3    Number of Visits  27    Date for PT Re-Evaluation  08/23/18    Authorization Type   9/67/5916 recert for knee; 38/46/6599 for recert on shoulder     PT Start Time  1115    PT Stop Time  1158    PT Time Calculation (min)  43 min    Activity Tolerance  Patient tolerated treatment well    Behavior During Therapy  [facility] for tasks assessed/performed       Past Medical History:  Diagnosis Date  . Anxiety   . Aortic atherosclerosis ([place])   . BPH (benign prostatic hyperplasia)   . Chronic pain syndrome    neck and low back  . DDD (degenerative disc disease), cervical   . Diverticulosis   . DJD (degenerative joint disease)   . Ganglion cyst    12 mm , right posterior knee  . Hyperlipidemia   . Internal hemorrhoids   . Migraines   . OA (osteoarthritis)    knees, hands, right shoulder  . Positive QuantiFERON-TB Gold test   . Shoulder pain   . Tear of right rotator cuff   . Type 2 diabetes mellitus ([place])    last A1c 5.4 on 04-13-2018 in epic (followed by pcp)  . Wears glasses     Past Surgical History:  Procedure Laterality Date  . ANAL FISSURE REPAIR    . ARTHOSCOPIC ROTAOR CUFF REPAIR Right 06/15/2018   Procedure: RIGHT SHOULDER ARTHROSCOPY, DEBRIDEMENT, SUBACROMIAL DECOMPRESSION, DISTAL CLAVICLE RESECTION, ROTATOR CUFF REPAIR;  Surgeon: [name], [name];  Location: [place];  Service: Orthopedics;  Laterality: Right;  . COLONOSCOPY  05-26-2017   dr Henrene Pastor  . Hip Resurface  2006  . KNEE ARTHROSCOPY W/ MENISCAL REPAIR Bilateral right 1993; left 2010  . MASS EXCISION Right  04/21/2018   Procedure: EXCISION RIGHT THIGH SOFT TISSUE MASS;  Surgeon: [name], [name];  [facility];  Service: Orthopedics;  Laterality: Right;  . SEPTOPLASTY    . SHOULDER ARTHROSCOPY WITH ROTATOR CUFF REPAIR Left   . SLAP Tear Repair  2008  . TONSILLECTOMY    . TOTAL KNEE ARTHROPLASTY Left 12/08/2016   Procedure: LEFT TOTAL KNEE ARTHROPLASTY;  Surgeon: [name], [name];  [facility];  Service: Orthopedics;  Laterality: Left;  . TOTAL KNEE ARTHROPLASTY WITH REVISION COMPONENTS Left 04/21/2018   Procedure: Left knee polyethylene exchange ;  Surgeon: [name], [name];  [facility];  Service: Orthopedics;  Laterality: Left;    There were no vitals filed for this visit.  Subjective Assessment - 07/02/18 1117    Subjective  I reactively reached for a plate 2 days ago. I was wearing a sling but is really sore today.     Patient Stated Goals  Retrun to normal     Currently in Pain?  Yes    Pain Score  4     Pain Location  Shoulder    Pain Orientation  Right    Pain Descriptors / Indicators  Sore  [place] Adult PT Treatment/Exercise - 07/02/18 0001      Exercises   Exercises  Shoulder      Shoulder Exercises: Supine   Other Supine Exercises  gentle resistance to elbow flx/ext      Shoulder Exercises: Isometric Strengthening   Other Isometric Exercises  gentle isometrics 6-way      Vasopneumatic   Number Minutes Vasopneumatic   15 minutes    Vasopnuematic Location   Shoulder    Vasopneumatic Pressure  Low    Vasopneumatic Temperature   lowest   also done at last visit.      Manual Therapy   Passive ROM  PROM per protocol               PT Short Term Goals - 06/28/18 1359      PT SHORT TERM GOAL #1   Title  pt will be I with initial HEP given     Time  4    Period  Weeks    Status  Achieved      PT SHORT TERM GOAL #2   Title  pt will be able to verbalize and demo techniques to prevent/ reduce edema/ pain via  RICE and HEP)    Time  4    Period  Weeks    Status  Achieved      PT SHORT TERM GOAL #3   Title  pt will increase L knee flexion to >/= 110 degrees and extension to >/= -5 degrees for functional progression     Baseline  flexion to 115   ext still -20    Time  4    Period  Weeks    Status  Partially Met      PT SHORT TERM GOAL #4   Title  pt will be able to stand/ ambulate >/= 15 min with </= 5/10 pain with LRAD to promote functional mobililty (01/12/2017    Time  4    Period  Weeks    Status  On-going      PT SHORT TERM GOAL #5   Title  Patient will increase passive right shoulder passive ER 40 percent per protocol     Time  3    Period  Weeks    Status  New    Target Date  07/19/18      Additional Short Term Goals   Additional Short Term Goals  Yes      PT SHORT TERM GOAL #6   Title  Patient will increase right shoulder flexion by 25 degrees per protocol     Time  3    Period  Weeks    Status  New        PT Long Term Goals - 06/28/18 1400      PT LONG TERM GOAL #1   Title  pt will be I with all HEP given as of last visit     Time  12    Period  Weeks    Status  On-going      PT LONG TERM GOAL #2   Title  pt will increase L knee flexion to >/=125 degrees and extension to >/=-2 degrees with </= 2/10 pain for functional and efficient gait pattern (02/06/2017)    Time  12    Period  Weeks    Status  On-going      PT LONG TERM GOAL #3   Title  pt will increase L knee strength to >/= 4/5 for  prolonged walking/ standing activities to promote safety (02/06/2017)    Time  12    Period  Weeks    Status  On-going      PT LONG TERM GOAL #4   Title  pt will be able to walk/ stand for >/= 30 minutes and navigate >/= 15 steps with >/=1 HHA for functional endurance and work related activities     Time  12    Period  Weeks    Status  On-going      PT LONG TERM GOAL #5   Title  He will increase FOTO score by >/= 42% limited to demonstrate improvement in functionat  Discharge (02/06/2017)    Time  8    Period  Weeks    Status  On-going      Additional Long Term Goals   Additional Long Term Goals  Yes      PT LONG TERM GOAL #6   Title  Patient will demsotrate 4+/5 grossright shoulder strength in order to perfrom ADL's     Time  8    Period  Weeks    Status  New    Target Date  08/23/18      PT LONG TERM GOAL #7   Title  Patient will demsotrate WNL active ER and IR on the right wihtout pain in order to perform ADL's     Time  8    Period  Weeks    Status  New    Target Date  08/23/18            Plan - 07/02/18 1154    Clinical Impression Statement  no evidence of damage to surgical sites after quick reach earlier this week. Soreness noted as expected. Still able to obtain allowed ROM and activate appropriately for gentle isometrics. will continue as allowed per post op protocol.     PT Treatment/Interventions  Manual techniques;Patient/family education;Therapeutic activities;Therapeutic exercise;Cryotherapy;Vasopneumatic Device;Passive range of motion;Taping;Functional mobility training;Gait training;Stair training;Neuromuscular re-education;Moist Heat    PT Next Visit Plan  follow protocol for RTC frepair from Dr Theda Sers.     PT Home Exercise Plan  pendulums, towel squeeze     Consulted and Agree with Plan of Care  Patient       Patient will benefit from skilled therapeutic intervention in order to improve the following deficits and impairments:  Pain, Decreased activity tolerance, Decreased range of motion, Decreased strength, Difficulty walking, Increased edema, Impaired UE functional use, Decreased endurance  Visit Diagnosis: Stiffness of right shoulder, not elsewhere classified  Acute pain of right shoulder     Problem List Patient Active Problem List   Diagnosis Date Noted  . Right shoulder injury 06/15/2018  . S/P arthroscopy of right shoulder 06/15/2018  . Failed total knee arthroplasty ([place]) 04/21/2018  . OA  (osteoarthritis) of knee 12/08/2016  . 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 Cervical facet joint syndrome 12/17/2015  . Chronic bilateral low back pain without sciatica 12/17/2015  . Primary osteoarthritis of left knee 12/17/2015   Trevia Nop C. Pattye Meda PT, DPT 07/02/18 12:03 PM   [place] [facility] [address] [place], [place], [address] Phone: [phone]   Fax:  [phone]  Name: Daniel SIEFERT, [name] MRN: [id number] Date of Birth: 1960-11-25

## 2018-07-05 ENCOUNTER — Encounter: Payer: Self-pay | Admitting: Physical Therapy

## 2018-07-05 ENCOUNTER — Ambulatory Visit: Payer: 59 | Admitting: Physical Therapy

## 2018-07-05 DIAGNOSIS — M25662 Stiffness of left knee, not elsewhere classified: Secondary | ICD-10-CM | POA: Diagnosis not present

## 2018-07-05 DIAGNOSIS — M25511 Pain in right shoulder: Secondary | ICD-10-CM | POA: Diagnosis not present

## 2018-07-05 DIAGNOSIS — M25611 Stiffness of right shoulder, not elsewhere classified: Secondary | ICD-10-CM

## 2018-07-05 DIAGNOSIS — R600 Localized edema: Secondary | ICD-10-CM | POA: Diagnosis not present

## 2018-07-05 DIAGNOSIS — M25562 Pain in left knee: Secondary | ICD-10-CM | POA: Diagnosis not present

## 2018-07-05 DIAGNOSIS — R2689 Other abnormalities of gait and mobility: Secondary | ICD-10-CM | POA: Diagnosis not present

## 2018-07-05 DIAGNOSIS — M6281 Muscle weakness (generalized): Secondary | ICD-10-CM | POA: Diagnosis not present

## 2018-07-05 NOTE — Therapy (Signed)
[place], [place], [address] Phone: [phone]   Fax:  [phone]  Physical Therapy Treatment  Patient Details  Name: Daniel RADIGAN, [name] MRN: [id number] Date of Birth: September 03, 1961 Referring Provider: Dr [name]    Encounter Date: 07/05/2018  PT End of Session - 07/05/18 1149    Visit Number  4    Number of Visits  27    Date for PT Re-Evaluation  08/23/18    Authorization Type   3/88/8280 recert for knee; 03/49/1791 for recert on shoulder     PT Start Time  1148    PT Stop Time  1236    PT Time Calculation (min)  48 min    Activity Tolerance  Patient tolerated treatment well    Behavior During Therapy  [facility] for tasks assessed/performed       Past Medical History:  Diagnosis Date  . Anxiety   . Aortic atherosclerosis ([place])   . BPH (benign prostatic hyperplasia)   . Chronic pain syndrome    neck and low back  . DDD (degenerative disc disease), cervical   . Diverticulosis   . DJD (degenerative joint disease)   . Ganglion cyst    12 mm , right posterior knee  . Hyperlipidemia   . Internal hemorrhoids   . Migraines   . OA (osteoarthritis)    knees, hands, right shoulder  . Positive QuantiFERON-TB Gold test   . Shoulder pain   . Tear of right rotator cuff   . Type 2 diabetes mellitus ([place])    last A1c 5.4 on 04-13-2018 in epic (followed by pcp)  . Wears glasses     Past Surgical History:  Procedure Laterality Date  . ANAL FISSURE REPAIR    . ARTHOSCOPIC ROTAOR CUFF REPAIR Right 06/15/2018   Procedure: RIGHT SHOULDER ARTHROSCOPY, DEBRIDEMENT, SUBACROMIAL DECOMPRESSION, DISTAL CLAVICLE RESECTION, ROTATOR CUFF REPAIR;  Surgeon: [name], [name];  Location: [place];  Service: Orthopedics;  Laterality: Right;  . COLONOSCOPY  05-26-2017   dr Henrene Pastor  . Hip Resurface  2006  . KNEE ARTHROSCOPY W/ MENISCAL REPAIR Bilateral right 1993; left 2010  . MASS EXCISION Right  04/21/2018   Procedure: EXCISION RIGHT THIGH SOFT TISSUE MASS;  Surgeon: [name], [name];  [facility];  Service: Orthopedics;  Laterality: Right;  . SEPTOPLASTY    . SHOULDER ARTHROSCOPY WITH ROTATOR CUFF REPAIR Left   . SLAP Tear Repair  2008  . TONSILLECTOMY    . TOTAL KNEE ARTHROPLASTY Left 12/08/2016   Procedure: LEFT TOTAL KNEE ARTHROPLASTY;  Surgeon: [name], [name];  [facility];  Service: Orthopedics;  Laterality: Left;  . TOTAL KNEE ARTHROPLASTY WITH REVISION COMPONENTS Left 04/21/2018   Procedure: Left knee polyethylene exchange ;  Surgeon: [name], [name];  [facility];  Service: Orthopedics;  Laterality: Left;    There were no vitals filed for this visit.  Subjective Assessment - 07/05/18 1150    Subjective  sore today. neck feels a little sore around C7/T1    Currently in Pain?  Yes    Pain Score  5     Pain Location  Shoulder    Pain Orientation  Right    Pain Descriptors / Indicators  Aching;Sore                       [facility] Adult PT Treatment/Exercise - 07/05/18 0001      Shoulder Exercises: Isometric Strengthening  Other Isometric Exercises  gentle isometrics in supine      Vasopneumatic   Number Minutes Vasopneumatic   15 minutes    Vasopnuematic Location   Shoulder    Vasopneumatic Pressure  Low    Vasopneumatic Temperature   lowest      Manual Therapy   Manual therapy comments  cervical traction    Soft tissue mobilization  Rt upper trap    Passive ROM  PROM per protocol               PT Short Term Goals - 06/28/18 1359      PT SHORT TERM GOAL #1   Title  pt will be I with initial HEP given     Time  4    Period  Weeks    Status  Achieved      PT SHORT TERM GOAL #2   Title  pt will be able to verbalize and demo techniques to prevent/ reduce edema/ pain via RICE and HEP)    Time  4    Period  Weeks    Status  Achieved      PT SHORT TERM GOAL #3   Title  pt will increase L knee flexion to >/= 110  degrees and extension to >/= -5 degrees for functional progression     Baseline  flexion to 115   ext still -20    Time  4    Period  Weeks    Status  Partially Met      PT SHORT TERM GOAL #4   Title  pt will be able to stand/ ambulate >/= 15 min with </= 5/10 pain with LRAD to promote functional mobililty (01/12/2017    Time  4    Period  Weeks    Status  On-going      PT SHORT TERM GOAL #5   Title  Patient will increase passive right shoulder passive ER 40 percent per protocol     Time  3    Period  Weeks    Status  New    Target Date  07/19/18      Additional Short Term Goals   Additional Short Term Goals  Yes      PT SHORT TERM GOAL #6   Title  Patient will increase right shoulder flexion by 25 degrees per protocol     Time  3    Period  Weeks    Status  New        PT Long Term Goals - 06/28/18 1400      PT LONG TERM GOAL #1   Title  pt will be I with all HEP given as of last visit     Time  12    Period  Weeks    Status  On-going      PT LONG TERM GOAL #2   Title  pt will increase L knee flexion to >/=125 degrees and extension to >/=-2 degrees with </= 2/10 pain for functional and efficient gait pattern (02/06/2017)    Time  12    Period  Weeks    Status  On-going      PT LONG TERM GOAL #3   Title  pt will increase L knee strength to >/= 4/5 for prolonged walking/ standing activities to promote safety (02/06/2017)    Time  12    Period  Weeks    Status  On-going      PT LONG TERM GOAL #  4   Title  pt will be able to walk/ stand for >/= 30 minutes and navigate >/= 15 steps with >/=1 HHA for functional endurance and work related activities     Time  12    Period  Weeks    Status  On-going      PT LONG TERM GOAL #5   Title  He will increase FOTO score by >/= 42% limited to demonstrate improvement in functionat Discharge (02/06/2017)    Time  8    Period  Weeks    Status  On-going      Additional Long Term Goals   Additional Long Term Goals  Yes      PT  LONG TERM GOAL #6   Title  Patient will demsotrate 4+/5 grossright shoulder strength in order to perfrom ADL's     Time  8    Period  Weeks    Status  New    Target Date  08/23/18      PT LONG TERM GOAL #7   Title  Patient will demsotrate WNL active ER and IR on the right wihtout pain in order to perform ADL's     Time  8    Period  Weeks    Status  New    Target Date  08/23/18            Plan - 07/05/18 1230    Clinical Impression Statement  continuing to progress as appropriate per post op protocol.     PT Treatment/Interventions  Manual techniques;Patient/family education;Therapeutic activities;Therapeutic exercise;Cryotherapy;Vasopneumatic Device;Passive range of motion;Taping;Functional mobility training;Gait training;Stair training;Neuromuscular re-education;Moist Heat    PT Next Visit Plan  follow protocol for RTC frepair from Dr Theda Sers. add isometrics to HEP    PT Home Exercise Plan  pendulums, towel squeeze     Consulted and Agree with Plan of Care  Patient       Patient will benefit from skilled therapeutic intervention in order to improve the following deficits and impairments:  Pain, Decreased activity tolerance, Decreased range of motion, Decreased strength, Difficulty walking, Increased edema, Impaired UE functional use, Decreased endurance  Visit Diagnosis: Stiffness of right shoulder, not elsewhere classified  Acute pain of right shoulder     Problem List Patient Active Problem List   Diagnosis Date Noted  . Right shoulder injury 06/15/2018  . S/P arthroscopy of right shoulder 06/15/2018  . 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 OA (osteoarthritis) of knee 12/08/2016  . 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 Cervical facet joint syndrome 12/17/2015  . Chronic bilateral low back pain without sciatica 12/17/2015  . Primary osteoarthritis of left knee 12/17/2015    Faye Sanfilippo C. Keron Neenan PT,  DPT 07/05/18 12:35 PM   [place] [address] [place], [place], [address] Phone: [phone]   Fax:  [phone]  Name: Daniel MITTELMAN, [name] MRN: [id number] Date of Birth: 06-23-1961

## 2018-07-06 DIAGNOSIS — M9903 Segmental and somatic dysfunction of lumbar region: Secondary | ICD-10-CM | POA: Diagnosis not present

## 2018-07-06 DIAGNOSIS — M545 Low back pain: Secondary | ICD-10-CM | POA: Diagnosis not present

## 2018-07-06 DIAGNOSIS — M542 Cervicalgia: Secondary | ICD-10-CM | POA: Diagnosis not present

## 2018-07-06 DIAGNOSIS — M9901 Segmental and somatic dysfunction of cervical region: Secondary | ICD-10-CM | POA: Diagnosis not present

## 2018-07-07 ENCOUNTER — Ambulatory Visit: Payer: 59 | Admitting: Physical Therapy

## 2018-07-07 ENCOUNTER — Encounter: Payer: Self-pay | Admitting: Physical Therapy

## 2018-07-07 DIAGNOSIS — M25662 Stiffness of left knee, not elsewhere classified: Secondary | ICD-10-CM | POA: Diagnosis not present

## 2018-07-07 DIAGNOSIS — M25562 Pain in left knee: Secondary | ICD-10-CM | POA: Diagnosis not present

## 2018-07-07 DIAGNOSIS — R600 Localized edema: Secondary | ICD-10-CM | POA: Diagnosis not present

## 2018-07-07 DIAGNOSIS — M25511 Pain in right shoulder: Secondary | ICD-10-CM | POA: Diagnosis not present

## 2018-07-07 DIAGNOSIS — M25611 Stiffness of right shoulder, not elsewhere classified: Secondary | ICD-10-CM | POA: Diagnosis not present

## 2018-07-07 DIAGNOSIS — M6281 Muscle weakness (generalized): Secondary | ICD-10-CM | POA: Diagnosis not present

## 2018-07-07 DIAGNOSIS — R2689 Other abnormalities of gait and mobility: Secondary | ICD-10-CM | POA: Diagnosis not present

## 2018-07-07 NOTE — Therapy (Signed)
[place], [place], [address] Phone: [phone]   Fax:  [phone]  Physical Therapy Treatment  Patient Details  Name: Daniel CHANCE, MD MRN: [id number] Date of Birth: [birth year] Referring Provider: Dr [name]    Encounter Date: 07/07/2018  PT End of Session - 07/07/18 1221    Visit Number  5    Number of Visits  27    Date for PT Re-Evaluation  08/23/18    Authorization Type   02/06/8340 recert for knee; 96/22/2979 for recert on shoulder     PT Start Time  1145    PT Stop Time  1232    PT Time Calculation (min)  47 min    Activity Tolerance  Patient tolerated treatment well    Behavior During Therapy  [facility] for tasks assessed/performed       Past Medical History:  Diagnosis Date  . Anxiety   . Aortic atherosclerosis ([place])   . BPH (benign prostatic hyperplasia)   . Chronic pain syndrome    neck and low back  . DDD (degenerative disc disease), cervical   . Diverticulosis   . DJD (degenerative joint disease)   . Ganglion cyst    12 mm , right posterior knee  . Hyperlipidemia   . Internal hemorrhoids   . Migraines   . OA (osteoarthritis)    knees, hands, right shoulder  . Positive QuantiFERON-TB Gold test   . Shoulder pain   . Tear of right rotator cuff   . Type 2 diabetes mellitus ([place])    last A1c 5.4 on 04-13-2018 in epic (followed by pcp)  . Wears glasses     Past Surgical History:  Procedure Laterality Date  . ANAL FISSURE REPAIR    . ARTHOSCOPIC ROTAOR CUFF REPAIR Right 06/15/2018   Procedure: RIGHT SHOULDER ARTHROSCOPY, DEBRIDEMENT, SUBACROMIAL DECOMPRESSION, DISTAL CLAVICLE RESECTION, ROTATOR CUFF REPAIR;  Surgeon: [name], MD;  Location: [place];  Service: Orthopedics;  Laterality: Right;  . COLONOSCOPY  05-26-2017   dr Henrene Pastor  . Hip Resurface  2006  . KNEE ARTHROSCOPY W/ MENISCAL REPAIR Bilateral right 1993; left 2010  . MASS EXCISION Right  04/21/2018   Procedure: EXCISION RIGHT THIGH SOFT TISSUE MASS;  Surgeon: [name], MD;  [facility];  Service: Orthopedics;  Laterality: Right;  . SEPTOPLASTY    . SHOULDER ARTHROSCOPY WITH ROTATOR CUFF REPAIR Left   . SLAP Tear Repair  2008  . TONSILLECTOMY    . 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 TOTAL KNEE ARTHROPLASTY WITH REVISION COMPONENTS Left 04/21/2018   Procedure: Left knee polyethylene exchange ;  Surgeon: [name], MD;  [facility];  Service: Orthopedics;  Laterality: Left;    There were no vitals filed for this visit.  Subjective Assessment - 07/07/18 1147    Subjective  feeling pretty good.     Currently in Pain?  Yes    Pain Score  5    when I got up                      [facility] Adult PT Treatment/Exercise - 07/07/18 0001      Shoulder Exercises: Supine   Other Supine Exercises  PROM IR/ER with wand      Shoulder Exercises: Standing   Other Standing Exercises  pendulums      Shoulder Exercises: Isometric Strengthening   Other Isometric Exercises  seated gentle isometrics 6 way      Vasopneumatic   Number Minutes Vasopneumatic   15 minutes    Vasopnuematic Location   Shoulder    Vasopneumatic Pressure  Low    Vasopneumatic Temperature   lowest      Manual Therapy   Passive ROM  PROM per protocol               PT Short Term Goals - 06/28/18 1359      PT SHORT TERM GOAL #1   Title  pt will be I with initial HEP given     Time  4    Period  Weeks    Status  Achieved      PT SHORT TERM GOAL #2   Title  pt will be able to verbalize and demo techniques to prevent/ reduce edema/ pain via RICE and HEP)    Time  4    Period  Weeks    Status  Achieved      PT SHORT TERM GOAL #3   Title  pt will increase L knee flexion to >/= 110 degrees and extension to >/= -5 degrees for  functional progression     Baseline  flexion to 115   ext still -20    Time  4    Period  Weeks    Status  Partially Met      PT SHORT TERM GOAL #4   Title  pt will be able to stand/ ambulate >/= 15 min with </= 5/10 pain with LRAD to promote functional mobililty (01/12/2017    Time  4    Period  Weeks    Status  On-going      PT SHORT TERM GOAL #5   Title  Patient will increase passive right shoulder passive ER 40 percent per protocol     Time  3    Period  Weeks    Status  New    Target Date  07/19/18      Additional Short Term Goals   Additional Short Term Goals  Yes      PT SHORT TERM GOAL #6   Title  Patient will increase right shoulder flexion by 25 degrees per protocol     Time  3    Period  Weeks    Status  New        PT Long Term Goals - 06/28/18 1400      PT LONG TERM GOAL #1   Title  pt will be I with all HEP given as of last visit     Time  12    Period  Weeks    Status  On-going      PT LONG TERM GOAL #2   Title  pt will increase L knee flexion to >/=125 degrees and extension to >/=-2 degrees with </= 2/10 pain for functional and efficient gait pattern (02/06/2017)    Time  12    Period  Weeks    Status  On-going      PT LONG TERM GOAL #3   Title  pt will increase L knee strength to >/= 4/5 for prolonged walking/ standing activities to promote safety (02/06/2017)    Time  12    Period  Weeks    Status  On-going      PT LONG TERM GOAL #4   Title  pt  will be able to walk/ stand for >/= 30 minutes and navigate >/= 15 steps with >/=1 HHA for functional endurance and work related activities     Time  12    Period  Weeks    Status  On-going      PT LONG TERM GOAL #5   Title  He will increase FOTO score by >/= 42% limited to demonstrate improvement in functionat Discharge (02/06/2017)    Time  8    Period  Weeks    Status  On-going      Additional Long Term Goals   Additional Long Term Goals  Yes      PT LONG TERM GOAL #6   Title  Patient will  demsotrate 4+/5 grossright shoulder strength in order to perfrom ADL's     Time  8    Period  Weeks    Status  New    Target Date  08/23/18      PT LONG TERM GOAL #7   Title  Patient will demsotrate WNL active ER and IR on the right wihtout pain in order to perform ADL's     Time  8    Period  Weeks    Status  New    Target Date  08/23/18            Plan - 07/07/18 1229    Clinical Impression Statement  good tolerance to motion with a little AC joint soreness reported which is expected. Cont to perform pendulums at home and added passive self IR/ER in supine within allowed protocol ranges.     PT Treatment/Interventions  Manual techniques;Patient/family education;Therapeutic activities;Therapeutic exercise;Cryotherapy;Vasopneumatic Device;Passive range of motion;Taping;Functional mobility training;Gait training;Stair training;Neuromuscular re-education;Moist Heat    PT Next Visit Plan  follow protocol for RTC frepair from Dr Theda Sers. add isometrics to HEP    PT Home Exercise Plan  pendulums, towel squeeze, PROM IR/ER in supine    Consulted and Agree with Plan of Care  Patient       Patient will benefit from skilled therapeutic intervention in order to improve the following deficits and impairments:  Pain, Decreased activity tolerance, Decreased range of motion, Decreased strength, Difficulty walking, Increased edema, Impaired UE functional use, Decreased endurance  Visit Diagnosis: Stiffness of right shoulder, not elsewhere classified  Acute pain of right shoulder     Problem List Patient Active Problem List   Diagnosis Date Noted  . Right shoulder injury 06/15/2018  . S/P arthroscopy of right shoulder 06/15/2018  . 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 OA (osteoarthritis) of knee 12/08/2016  . 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 Cervical facet joint syndrome 12/17/2015  . Chronic bilateral low back pain  without sciatica 12/17/2015  . Primary osteoarthritis of left knee 12/17/2015   Nyriah Coote C. Emaree Chiu PT, DPT 07/07/18 12:33 PM   [facility] [address] [place], [place], [address] Phone: [phone]   Fax:  [phone]  Name: KJELL BRANNEN, MD MRN: [id number] Date of Birth: 06/02/1961

## 2018-07-14 ENCOUNTER — Encounter: Payer: Self-pay | Admitting: Physical Therapy

## 2018-07-14 ENCOUNTER — Ambulatory Visit: Payer: 59 | Attending: Student | Admitting: Physical Therapy

## 2018-07-14 DIAGNOSIS — M25511 Pain in right shoulder: Secondary | ICD-10-CM | POA: Insufficient documentation

## 2018-07-14 DIAGNOSIS — M25611 Stiffness of right shoulder, not elsewhere classified: Secondary | ICD-10-CM | POA: Insufficient documentation

## 2018-07-14 NOTE — Therapy (Signed)
[place], [place], [address] Phone: [phone]   Fax:  [phone]  Physical Therapy Treatment  Patient Details  Name: Daniel BELLEROSE, [name] MRN: [id number] Date of Birth: 1961/07/14 Referring Provider (PT): Dr [name]    Encounter Date: 07/14/2018  PT End of Session - 07/14/18 0935    Visit Number  6    Number of Visits  27    Date for PT Re-Evaluation  08/23/18    Authorization Type   0/76/8088 recert for knee; 08/15/1593 for recert on shoulder     PT Start Time  0930    PT Stop Time  1024    PT Time Calculation (min)  54 min    Activity Tolerance  Patient tolerated treatment well    Behavior During Therapy  [facility] for tasks assessed/performed       Past Medical History:  Diagnosis Date  . Anxiety   . Aortic atherosclerosis ([place])   . BPH (benign prostatic hyperplasia)   . Chronic pain syndrome    neck and low back  . DDD (degenerative disc disease), cervical   . Diverticulosis   . DJD (degenerative joint disease)   . Ganglion cyst    12 mm , right posterior knee  . Hyperlipidemia   . Internal hemorrhoids   . Migraines   . OA (osteoarthritis)    knees, hands, right shoulder  . Positive QuantiFERON-TB Gold test   . Shoulder pain   . Tear of right rotator cuff   . Type 2 diabetes mellitus ([place])    last A1c 5.4 on 04-13-2018 in epic (followed by pcp)  . Wears glasses     Past Surgical History:  Procedure Laterality Date  . ANAL FISSURE REPAIR    . ARTHOSCOPIC ROTAOR CUFF REPAIR Right 06/15/2018   Procedure: RIGHT SHOULDER ARTHROSCOPY, DEBRIDEMENT, SUBACROMIAL DECOMPRESSION, DISTAL CLAVICLE RESECTION, ROTATOR CUFF REPAIR;  Surgeon: [name], [name];  Location: [place];  Service: Orthopedics;  Laterality: Right;  . COLONOSCOPY  05-26-2017   dr Henrene Pastor  . Hip Resurface  2006  . KNEE ARTHROSCOPY W/ MENISCAL REPAIR Bilateral right 1993; left 2010  . MASS EXCISION Right  04/21/2018   Procedure: EXCISION RIGHT THIGH SOFT TISSUE MASS;  Surgeon: [name], [name];  Location: [facility];  Service: Orthopedics;  Laterality: Right;  . SEPTOPLASTY    . SHOULDER ARTHROSCOPY WITH ROTATOR CUFF REPAIR Left   . SLAP Tear Repair  2008  . TONSILLECTOMY    . TOTAL KNEE ARTHROPLASTY Left 12/08/2016   Procedure: LEFT TOTAL KNEE ARTHROPLASTY;  Surgeon: [name], [name];  Location: [facility];  Service: Orthopedics;  Laterality: Left;  . TOTAL KNEE ARTHROPLASTY WITH REVISION COMPONENTS Left 04/21/2018   Procedure: Left knee polyethylene exchange ;  Surgeon: [name], [name];  Location: [facility];  Service: Orthopedics;  Laterality: Left;    There were no vitals filed for this visit.  Subjective Assessment - 07/14/18 0933    Subjective  mild discomfort. doing HEP    Currently in Pain?  Yes    Pain Score  4     Pain Location  Shoulder    Pain Orientation  Right    Aggravating Factors   moving arm    Pain Relieving Factors  ice         [facility] PT Assessment - 07/14/18 0001      ROM / Strength   AROM / PROM / Strength  PROM  PROM   PROM Assessment Site  Shoulder    Right/Left Shoulder  Right    Right Shoulder Flexion  124 Degrees    Right Shoulder ABduction  105 Degrees    Right Shoulder External Rotation  65 Degrees   visual, at 45 abd                  [facility] Adult PT Treatment/Exercise - 07/14/18 0001      Exercises   Exercises  Shoulder      Shoulder Exercises: Seated   Other Seated Exercises  full range biceps curl    Other Seated Exercises  triceps ext yellow tband      Shoulder Exercises: Pulleys   Flexion  5 minutes   x2 rounds     Shoulder Exercises: Isometric Strengthening   Other Isometric Exercises  seated isometrics by PT 6-way      Vasopneumatic   Number Minutes Vasopneumatic   15 minutes    Vasopnuematic Location   Shoulder    Vasopneumatic Pressure  Low    Vasopneumatic Temperature   lowest      Manual Therapy   Passive ROM   PROM per protocol             PT Education - 07/14/18 1013    Education Details  new protocol limits, controling pain with movement, HEP, progress    Person(s) Educated  Patient    Methods  Explanation;Demonstration;Tactile cues;Verbal cues    Comprehension  Verbalized understanding;Returned demonstration;Verbal cues required;Tactile cues required;Need further instruction       PT Short Term Goals - 06/28/18 1359      PT SHORT TERM GOAL #1   Title  pt will be I with initial HEP given     Time  4    Period  Weeks    Status  Achieved      PT SHORT TERM GOAL #2   Title  pt will be able to verbalize and demo techniques to prevent/ reduce edema/ pain via RICE and HEP)    Time  4    Period  Weeks    Status  Achieved      PT SHORT TERM GOAL #3   Title  pt will increase L knee flexion to >/= 110 degrees and extension to >/= -5 degrees for functional progression     Baseline  flexion to 115   ext still -20    Time  4    Period  Weeks    Status  Partially Met      PT SHORT TERM GOAL #4   Title  pt will be able to stand/ ambulate >/= 15 min with </= 5/10 pain with LRAD to promote functional mobililty (01/12/2017    Time  4    Period  Weeks    Status  On-going      PT SHORT TERM GOAL #5   Title  Patient will increase passive right shoulder passive ER 40 percent per protocol     Time  3    Period  Weeks    Status  New    Target Date  07/19/18      Additional Short Term Goals   Additional Short Term Goals  Yes      PT SHORT TERM GOAL #6   Title  Patient will increase right shoulder flexion by 25 degrees per protocol     Time  3    Period  Weeks    Status  New        PT Long Term Goals - 06/28/18 1400      PT LONG TERM GOAL #1   Title  pt will be I with all HEP given as of last visit     Time  12    Period  Weeks    Status  On-going      PT LONG TERM GOAL #2   Title  pt will increase L knee flexion to >/=125 degrees and extension to >/=-2 degrees with </= 2/10  pain for functional and efficient gait pattern (02/06/2017)    Time  12    Period  Weeks    Status  On-going      PT LONG TERM GOAL #3   Title  pt will increase L knee strength to >/= 4/5 for prolonged walking/ standing activities to promote safety (02/06/2017)    Time  12    Period  Weeks    Status  On-going      PT LONG TERM GOAL #4   Title  pt will be able to walk/ stand for >/= 30 minutes and navigate >/= 15 steps with >/=1 HHA for functional endurance and work related activities     Time  12    Period  Weeks    Status  On-going      PT LONG TERM GOAL #5   Title  He will increase FOTO score by >/= 42% limited to demonstrate improvement in functionat Discharge (02/06/2017)    Time  8    Period  Weeks    Status  On-going      Additional Long Term Goals   Additional Long Term Goals  Yes      PT LONG TERM GOAL #6   Title  Patient will demsotrate 4+/5 grossright shoulder strength in order to perfrom ADL's     Time  8    Period  Weeks    Status  New    Target Date  08/23/18      PT LONG TERM GOAL #7   Title  Patient will demsotrate WNL active ER and IR on the right wihtout pain in order to perform ADL's     Time  8    Period  Weeks    Status  New    Target Date  08/23/18            Plan - 07/14/18 1011    Clinical Impression Statement  able to progress into 4-6 week protocol limits today which pt tolerated well. able to achieve 90 deg flexion on pulleys before feeling discomfort. light resistance to triceps extension also added without pain. cues for scapular retraction required.     PT Treatment/Interventions  Manual techniques;Patient/family education;Therapeutic activities;Therapeutic exercise;Cryotherapy;Vasopneumatic Device;Passive range of motion;Taping;Functional mobility training;Gait training;Stair training;Neuromuscular re-education;Moist Heat    PT Next Visit Plan  follow protocol for RTC frepair from Dr Theda Sers.     PT Home Exercise Plan  pendulums, towel  squeeze, PROM IR/ER in supine, resisted triceps extension yellow    Consulted and Agree with Plan of Care  Patient       Patient will benefit from skilled therapeutic intervention in order to improve the following deficits and impairments:  Pain, Decreased activity tolerance, Decreased range of motion, Decreased strength, Difficulty walking, Increased edema, Impaired UE functional use, Decreased endurance  Visit Diagnosis: Stiffness of right shoulder, not elsewhere classified  Acute pain of right shoulder     Problem List Patient Active  Problem List   Diagnosis Date Noted  . Right shoulder injury 06/15/2018  . S/P arthroscopy of right shoulder 06/15/2018  . Failed total knee arthroplasty ([place]) 04/21/2018  . OA (osteoarthritis) of knee 12/08/2016  . 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 Cervical facet joint syndrome 12/17/2015  . Chronic bilateral low back pain without sciatica 12/17/2015  . Primary osteoarthritis of left knee 12/17/2015   Ryaan Vanwagoner C. Nyah Shepherd PT, DPT 07/14/18 10:15 AM   [place], [place], [address] Phone: [phone]   Fax:  [phone]  Name: Daniel LANKFORD, [name] MRN: [id number] Date of Birth: 1961/02/18

## 2018-07-16 ENCOUNTER — Ambulatory Visit: Payer: 59 | Admitting: Physical Therapy

## 2018-07-16 ENCOUNTER — Encounter: Payer: Self-pay | Admitting: Physical Therapy

## 2018-07-16 DIAGNOSIS — M25511 Pain in right shoulder: Secondary | ICD-10-CM | POA: Diagnosis not present

## 2018-07-16 DIAGNOSIS — M25611 Stiffness of right shoulder, not elsewhere classified: Secondary | ICD-10-CM | POA: Diagnosis not present

## 2018-07-16 NOTE — Therapy (Signed)
[place], [place], [address] Phone: [phone]   Fax:  [phone]  Physical Therapy Treatment  Patient Details  Name: Daniel LUBERTO, [name] MRN: [id number] Date of Birth: [birth year] Referring Provider (PT): Dr [name]    Encounter Date: 07/16/2018  PT End of Session - 07/16/18 0953    Visit Number  7    Number of Visits  27    Date for PT Re-Evaluation  08/23/18    Authorization Type   9/47/0962 recert for knee; 83/66/2947 for recert on shoulder     PT Start Time  0950    PT Stop Time  1041    PT Time Calculation (min)  51 min    Activity Tolerance  Patient tolerated treatment well    Behavior During Therapy  [facility] for tasks assessed/performed       Past Medical History:  Diagnosis Date  . Anxiety   . Aortic atherosclerosis ([place])   . BPH (benign prostatic hyperplasia)   . Chronic pain syndrome    neck and low back  . DDD (degenerative disc disease), cervical   . Diverticulosis   . DJD (degenerative joint disease)   . Ganglion cyst    12 mm , right posterior knee  . Hyperlipidemia   . Internal hemorrhoids   . Migraines   . OA (osteoarthritis)    knees, hands, right shoulder  . Positive QuantiFERON-TB Gold test   . Shoulder pain   . Tear of right rotator cuff   . Type 2 diabetes mellitus ([place])    last A1c 5.4 on 04-13-2018 in epic (followed by pcp)  . Wears glasses     Past Surgical History:  Procedure Laterality Date  . ANAL FISSURE REPAIR    . ARTHOSCOPIC ROTAOR CUFF REPAIR Right 06/15/2018   Procedure: RIGHT SHOULDER ARTHROSCOPY, DEBRIDEMENT, SUBACROMIAL DECOMPRESSION, DISTAL CLAVICLE RESECTION, ROTATOR CUFF REPAIR;  Surgeon: [name], [name];  Location: [place];  Service: Orthopedics;  Laterality: Right;  . COLONOSCOPY  05-26-2017   dr Henrene Pastor  . Hip Resurface  2006  . KNEE ARTHROSCOPY W/ MENISCAL REPAIR Bilateral right 1993; left 2010  . MASS EXCISION Right  04/21/2018   Procedure: EXCISION RIGHT THIGH SOFT TISSUE MASS;  Surgeon: [name], [name];  [facility];  Service: Orthopedics;  Laterality: Right;  . SEPTOPLASTY    . SHOULDER ARTHROSCOPY WITH ROTATOR CUFF REPAIR Left   . SLAP Tear Repair  2008  . TONSILLECTOMY    . TOTAL KNEE ARTHROPLASTY Left 12/08/2016   Procedure: LEFT TOTAL KNEE ARTHROPLASTY;  Surgeon: [name], [name];  [facility];  Service: Orthopedics;  Laterality: Left;  . TOTAL KNEE ARTHROPLASTY WITH REVISION COMPONENTS Left 04/21/2018   Procedure: Left knee polyethylene exchange ;  Surgeon: [name], [name];  [facility];  Service: Orthopedics;  Laterality: Left;    There were no vitals filed for this visit.  Subjective Assessment - 07/16/18 0953    Subjective  was hurting after last visit    Currently in Pain?  Yes    Pain Score  6     Pain Location  Shoulder    Pain Orientation  Right    Pain Descriptors / Indicators  Sore                       [facility] Adult PT Treatment/Exercise - 07/16/18 0001      Exercises   Exercises  Shoulder  Shoulder Exercises: Standing   Row Limitations  hand on table    Other Standing Exercises  dynamic stab- green plyo ball on counter      Shoulder Exercises: Pulleys   Flexion  5 minutes      Shoulder Exercises: Stretch   Other Shoulder Stretches  towel slide on table      Vasopneumatic   Number Minutes Vasopneumatic   15 minutes    Vasopnuematic Location   Shoulder    Vasopneumatic Pressure  Low    Vasopneumatic Temperature   lowest      Manual Therapy   Passive ROM  PROM per protocol               PT Short Term Goals - 06/28/18 1359      PT SHORT TERM GOAL #1   Title  pt will be I with initial HEP given     Time  4    Period  Weeks    Status  Achieved      PT SHORT TERM GOAL #2   Title  pt will be able to verbalize and demo techniques to prevent/ reduce edema/ pain via RICE and HEP)    Time  4    Period  Weeks    Status   Achieved      PT SHORT TERM GOAL #3   Title  pt will increase L knee flexion to >/= 110 degrees and extension to >/= -5 degrees for functional progression     Baseline  flexion to 115   ext still -20    Time  4    Period  Weeks    Status  Partially Met      PT SHORT TERM GOAL #4   Title  pt will be able to stand/ ambulate >/= 15 min with </= 5/10 pain with LRAD to promote functional mobililty (01/12/2017    Time  4    Period  Weeks    Status  On-going      PT SHORT TERM GOAL #5   Title  Patient will increase passive right shoulder passive ER 40 percent per protocol     Time  3    Period  Weeks    Status  New    Target Date  07/19/18      Additional Short Term Goals   Additional Short Term Goals  Yes      PT SHORT TERM GOAL #6   Title  Patient will increase right shoulder flexion by 25 degrees per protocol     Time  3    Period  Weeks    Status  New        PT Long Term Goals - 06/28/18 1400      PT LONG TERM GOAL #1   Title  pt will be I with all HEP given as of last visit     Time  12    Period  Weeks    Status  On-going      PT LONG TERM GOAL #2   Title  pt will increase L knee flexion to >/=125 degrees and extension to >/=-2 degrees with </= 2/10 pain for functional and efficient gait pattern (02/06/2017)    Time  12    Period  Weeks    Status  On-going      PT LONG TERM GOAL #3   Title  pt will increase L knee strength to >/= 4/5 for prolonged walking/ standing activities to  promote safety (02/06/2017)    Time  12    Period  Weeks    Status  On-going      PT LONG TERM GOAL #4   Title  pt will be able to walk/ stand for >/= 30 minutes and navigate >/= 15 steps with >/=1 HHA for functional endurance and work related activities     Time  12    Period  Weeks    Status  On-going      PT LONG TERM GOAL #5   Title  He will increase FOTO score by >/= 42% limited to demonstrate improvement in functionat Discharge (02/06/2017)    Time  8    Period  Weeks    Status   On-going      Additional Long Term Goals   Additional Long Term Goals  Yes      PT LONG TERM GOAL #6   Title  Patient will demsotrate 4+/5 grossright shoulder strength in order to perfrom ADL's     Time  8    Period  Weeks    Status  New    Target Date  08/23/18      PT LONG TERM GOAL #7   Title  Patient will demsotrate WNL active ER and IR on the right wihtout pain in order to perform ADL's     Time  8    Period  Weeks    Status  New    Target Date  08/23/18            Plan - 07/16/18 1029    Clinical Impression Statement  good periscapular activation with exercises, improved ability to reduce upper trap activtion. progressed HEP today with some soreness as expected. still progressing toward ROM in this phase, shoulder flexion to approx 130 today per visual measurement.     PT Treatment/Interventions  Manual techniques;Patient/family education;Therapeutic activities;Therapeutic exercise;Cryotherapy;Vasopneumatic Device;Passive range of motion;Taping;Functional mobility training;Gait training;Stair training;Neuromuscular re-education;Moist Heat    PT Next Visit Plan  follow protocol for RTC frepair from Dr Theda Sers. consider progressing to [facility] with wand    PT Home Exercise Plan  pendulums, towel squeeze, PROM IR/ER in supine, resisted triceps extension yellow; dynamic stab ball on counter, bent over row, towel slide on table    Consulted and Agree with Plan of Care  Patient       Patient will benefit from skilled therapeutic intervention in order to improve the following deficits and impairments:  Pain, Decreased activity tolerance, Decreased range of motion, Decreased strength, Difficulty walking, Increased edema, Impaired UE functional use, Decreased endurance  Visit Diagnosis: Stiffness of right shoulder, not elsewhere classified  Acute pain of right shoulder     Problem List Patient Active Problem List   Diagnosis Date Noted  . Right shoulder injury 06/15/2018   . S/P arthroscopy of right shoulder 06/15/2018  . Failed total knee arthroplasty ([place]) 04/21/2018  . OA (osteoarthritis) of knee 12/08/2016  . 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 Cervical facet joint syndrome 12/17/2015  . Chronic bilateral low back pain without sciatica 12/17/2015  . Primary osteoarthritis of left knee 12/17/2015    Yoseline Andersson C. Okie Bogacz PT, DPT 07/16/18 10:34 AM   [facility] [address] [place], [place], [address] Phone: [phone]   Fax:  [phone]  Name: Daniel OZBURN, [name] MRN: [id number] Date of Birth: Sep 14, 1961

## 2018-07-19 ENCOUNTER — Ambulatory Visit: Payer: 59 | Admitting: Physical Therapy

## 2018-07-19 ENCOUNTER — Encounter: Payer: Self-pay | Admitting: Physical Therapy

## 2018-07-19 DIAGNOSIS — M25611 Stiffness of right shoulder, not elsewhere classified: Secondary | ICD-10-CM

## 2018-07-19 DIAGNOSIS — M25511 Pain in right shoulder: Secondary | ICD-10-CM

## 2018-07-19 NOTE — Therapy (Signed)
[place], [place], [address] Phone: [phone]   Fax:  [phone]  Physical Therapy Treatment  Patient Details  Name: Daniel CARMEN, MD MRN: [id number] Date of Birth: 05/02/1961 Referring Provider (PT): Dr [name]    Encounter Date: 07/19/2018  PT End of Session - 07/19/18 1146    Visit Number  8    Number of Visits  27    Date for PT Re-Evaluation  08/23/18    Authorization Type   11/06/5807 recert for knee; 98/33/8250 for recert on shoulder     PT Start Time  1146    PT Stop Time  1240    PT Time Calculation (min)  54 min    Activity Tolerance  Patient tolerated treatment well    Behavior During Therapy  [facility] for tasks assessed/performed       Past Medical History:  Diagnosis Date  . Anxiety   . Aortic atherosclerosis ([place])   . BPH (benign prostatic hyperplasia)   . Chronic pain syndrome    neck and low back  . DDD (degenerative disc disease), cervical   . Diverticulosis   . DJD (degenerative joint disease)   . Ganglion cyst    12 mm , right posterior knee  . Hyperlipidemia   . Internal hemorrhoids   . Migraines   . OA (osteoarthritis)    knees, hands, right shoulder  . Positive QuantiFERON-TB Gold test   . Shoulder pain   . Tear of right rotator cuff   . Type 2 diabetes mellitus ([place])    last A1c 5.4 on 04-13-2018 in epic (followed by pcp)  . Wears glasses     Past Surgical History:  Procedure Laterality Date  . ANAL FISSURE REPAIR    . ARTHOSCOPIC ROTAOR CUFF REPAIR Right 06/15/2018   Procedure: RIGHT SHOULDER ARTHROSCOPY, DEBRIDEMENT, SUBACROMIAL DECOMPRESSION, DISTAL CLAVICLE RESECTION, ROTATOR CUFF REPAIR;  Surgeon: [name], MD;  Location: [facility];  Service: Orthopedics;  Laterality: Right;  . COLONOSCOPY  05-26-2017   dr Henrene Pastor  . Hip Resurface  2006  . KNEE ARTHROSCOPY W/ MENISCAL REPAIR Bilateral right 1993; left 2010  . MASS EXCISION Right  04/21/2018   Procedure: EXCISION RIGHT THIGH SOFT TISSUE MASS;  Surgeon: [name], MD;  Location: [facility];  Service: Orthopedics;  Laterality: Right;  . SEPTOPLASTY    . SHOULDER ARTHROSCOPY WITH ROTATOR CUFF REPAIR Left   . SLAP Tear Repair  2008  . TONSILLECTOMY    . 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 TOTAL KNEE ARTHROPLASTY WITH REVISION COMPONENTS Left 04/21/2018   Procedure: Left knee polyethylene exchange ;  Surgeon: [name], MD;  Location[facility];  Service: Orthopedics;  Laterality: Left;    There were no vitals filed for this visit.  Subjective Assessment - 07/19/18 1147    Subjective  Sore after last visit, sharp pain around 90 on pulleys    Currently in Pain?  Yes    Pain Score  5     Pain Location  Shoulder    Pain Orientation  Right    Pain Descriptors / Indicators  Sore    Aggravating Factors   movement    Pain Relieving Factors  ice  [facility] Adult PT Treatment/Exercise - 07/19/18 0001      Shoulder Exercises: Seated   Other Seated Exercises  UE ranger seated in chair    Other Seated Exercises  biceps curl, supination & neutral 1lb; triceps ext yellow      Shoulder Exercises: Standing   Row Limitations  Lt hand on table    Other Standing Exercises  dynamic stab- green plyo ABCs on counter, red physioball on table circles      Shoulder Exercises: Pulleys   Flexion  5 minutes      Shoulder Exercises: Stretch   Other Shoulder Stretches  pendulums      Vasopneumatic   Number Minutes Vasopneumatic   15 minutes    Vasopnuematic Location   Shoulder    Vasopneumatic Pressure  Low    Vasopneumatic Temperature   lowest      Manual Therapy   Passive ROM  PROM per protocol               PT Short Term Goals - 06/28/18 1359      PT SHORT TERM GOAL #1   Title  pt will be I  with initial HEP given     Time  4    Period  Weeks    Status  Achieved      PT SHORT TERM GOAL #2   Title  pt will be able to verbalize and demo techniques to prevent/ reduce edema/ pain via RICE and HEP)    Time  4    Period  Weeks    Status  Achieved      PT SHORT TERM GOAL #3   Title  pt will increase L knee flexion to >/= 110 degrees and extension to >/= -5 degrees for functional progression     Baseline  flexion to 115   ext still -20    Time  4    Period  Weeks    Status  Partially Met      PT SHORT TERM GOAL #4   Title  pt will be able to stand/ ambulate >/= 15 min with </= 5/10 pain with LRAD to promote functional mobililty (01/12/2017    Time  4    Period  Weeks    Status  On-going      PT SHORT TERM GOAL #5   Title  Patient will increase passive right shoulder passive ER 40 percent per protocol     Time  3    Period  Weeks    Status  New    Target Date  07/19/18      Additional Short Term Goals   Additional Short Term Goals  Yes      PT SHORT TERM GOAL #6   Title  Patient will increase right shoulder flexion by 25 degrees per protocol     Time  3    Period  Weeks    Status  New        PT Long Term Goals - 06/28/18 1400      PT LONG TERM GOAL #1   Title  pt will be I with all HEP given as of last visit     Time  12    Period  Weeks    Status  On-going      PT LONG TERM GOAL #2   Title  pt will increase L knee flexion to >/=125 degrees and extension to >/=-2 degrees with </= 2/10 pain for functional and efficient  gait pattern (02/06/2017)    Time  12    Period  Weeks    Status  On-going      PT LONG TERM GOAL #3   Title  pt will increase L knee strength to >/= 4/5 for prolonged walking/ standing activities to promote safety (02/06/2017)    Time  12    Period  Weeks    Status  On-going      PT LONG TERM GOAL #4   Title  pt will be able to walk/ stand for >/= 30 minutes and navigate >/= 15 steps with >/=1 HHA for functional endurance and work related  activities     Time  12    Period  Weeks    Status  On-going      PT LONG TERM GOAL #5   Title  He will increase FOTO score by >/= 42% limited to demonstrate improvement in functionat Discharge (02/06/2017)    Time  8    Period  Weeks    Status  On-going      Additional Long Term Goals   Additional Long Term Goals  Yes      PT LONG TERM GOAL #6   Title  Patient will demsotrate 4+/5 grossright shoulder strength in order to perfrom ADL's     Time  8    Period  Weeks    Status  New    Target Date  08/23/18      PT LONG TERM GOAL #7   Title  Patient will demsotrate WNL active ER and IR on the right wihtout pain in order to perform ADL's     Time  8    Period  Weeks    Status  New    Target Date  08/23/18            Plan - 07/19/18 1237    Clinical Impression Statement  Utilized UE ranger in place of table slides for added stabilization. Pt denied sharp pain but did have difficulty. chose not to add wand today due to soreness noted after last visit. continues to demo good PROM without pain.     PT Treatment/Interventions  Manual techniques;Patient/family education;Therapeutic activities;Therapeutic exercise;Cryotherapy;Vasopneumatic Device;Passive range of motion;Taping;Functional mobility training;Gait training;Stair training;Neuromuscular re-education;Moist Heat    PT Next Visit Plan  follow protocol for RTC frepair from Dr Theda Sers. consider progressing to [facility] with wand    PT Home Exercise Plan  pendulums, towel squeeze, PROM IR/ER in supine, resisted triceps extension yellow; dynamic stab ball on counter, bent over row, towel slide on table    Consulted and Agree with Plan of Care  Patient       Patient will benefit from skilled therapeutic intervention in order to improve the following deficits and impairments:  Pain, Decreased activity tolerance, Decreased range of motion, Decreased strength, Difficulty walking, Increased edema, Impaired UE functional use, Decreased  endurance  Visit Diagnosis: Stiffness of right shoulder, not elsewhere classified  Acute pain of right shoulder     Problem List Patient Active Problem List   Diagnosis Date Noted  . Right shoulder injury 06/15/2018  . S/P arthroscopy of right shoulder 06/15/2018  . Failed total knee arthroplasty ([place]) 04/21/2018  . OA (osteoarthritis) of knee 12/08/2016  . 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 Cervical facet joint syndrome 12/17/2015  . Chronic bilateral low back pain without sciatica 12/17/2015  . Primary osteoarthritis of left knee 12/17/2015    Zahmir Lalla C.   PT, DPT 07/19/18 12:40 PM   [facility] [address] [place], [place], [address] Phone: [phone]   Fax:  [phone]  Name: Daniel WIBERG, MD MRN: [id number] Date of Birth: 17-Oct-1960

## 2018-07-21 ENCOUNTER — Encounter: Payer: 59 | Admitting: Physical Therapy

## 2018-07-26 ENCOUNTER — Ambulatory Visit: Payer: 59 | Admitting: Physical Therapy

## 2018-07-26 ENCOUNTER — Encounter: Payer: Self-pay | Admitting: Physical Therapy

## 2018-07-26 DIAGNOSIS — M25611 Stiffness of right shoulder, not elsewhere classified: Secondary | ICD-10-CM

## 2018-07-26 DIAGNOSIS — M25511 Pain in right shoulder: Secondary | ICD-10-CM

## 2018-07-26 NOTE — Therapy (Signed)
[place], [place], [address] Phone: [phone]   Fax:  [phone]  Physical Therapy Treatment  Patient Details  Name: Daniel MCQUITTY, [name] MRN: [id number] Date of Birth: 05-07-1961 Referring Provider (PT): Dr [name]    Encounter Date: 07/26/2018  PT End of Session - 07/26/18 1146    Visit Number  9    Number of Visits  27    Date for PT Re-Evaluation  08/23/18    Authorization Type   1/32/4401 recert for knee; 02/72/5366 for recert on shoulder     PT Start Time  1146    PT Stop Time  1243    PT Time Calculation (min)  57 min    Activity Tolerance  Patient tolerated treatment well    Behavior During Therapy  [facility] for tasks assessed/performed       Past Medical History:  Diagnosis Date  . Anxiety   . Aortic atherosclerosis ([place])   . BPH (benign prostatic hyperplasia)   . Chronic pain syndrome    neck and low back  . DDD (degenerative disc disease), cervical   . Diverticulosis   . DJD (degenerative joint disease)   . Ganglion cyst    12 mm , right posterior knee  . Hyperlipidemia   . Internal hemorrhoids   . Migraines   . OA (osteoarthritis)    knees, hands, right shoulder  . Positive QuantiFERON-TB Gold test   . Shoulder pain   . Tear of right rotator cuff   . Type 2 diabetes mellitus ([place])    last A1c 5.4 on 04-13-2018 in epic (followed by pcp)  . Wears glasses     Past Surgical History:  Procedure Laterality Date  . ANAL FISSURE REPAIR    . ARTHOSCOPIC ROTAOR CUFF REPAIR Right 06/15/2018   Procedure: RIGHT SHOULDER ARTHROSCOPY, DEBRIDEMENT, SUBACROMIAL DECOMPRESSION, DISTAL CLAVICLE RESECTION, ROTATOR CUFF REPAIR;  Surgeon: [name], [name];  Location: [facility];  Service: Orthopedics;  Laterality: Right;  . COLONOSCOPY  05-26-2017   dr Henrene Pastor  . Hip Resurface  2006  . KNEE ARTHROSCOPY W/ MENISCAL REPAIR Bilateral right 1993; left 2010  . MASS EXCISION Right  04/21/2018   Procedure: EXCISION RIGHT THIGH SOFT TISSUE MASS;  Surgeon: [name], [name];  [facility];  Service: Orthopedics;  Laterality: Right;  . SEPTOPLASTY    . SHOULDER ARTHROSCOPY WITH ROTATOR CUFF REPAIR Left   . SLAP Tear Repair  2008  . TONSILLECTOMY    . TOTAL KNEE ARTHROPLASTY Left 12/08/2016   Procedure: LEFT TOTAL KNEE ARTHROPLASTY;  Surgeon: [name], [name];  [facility];  Service: Orthopedics;  Laterality: Left;  . TOTAL KNEE ARTHROPLASTY WITH REVISION COMPONENTS Left 04/21/2018   Procedure: Left knee polyethylene exchange ;  Surgeon: [name], [name];  [facility];  Service: Orthopedics;  Laterality: Left;    There were no vitals filed for this visit.  Subjective Assessment - 07/26/18 1148    Subjective  not too bad. discomfort in palmar aspect of 3rd MTP         [facility] PT Assessment - 07/26/18 0001      PROM   Right Shoulder Flexion  138 Degrees   AAROM with wand                  [facility] Adult PT Treatment/Exercise - 07/26/18 0001      Exercises   Exercises  Shoulder      Shoulder Exercises:  Supine   Flexion Limitations  AAROM with wand to tolerance    Other Supine Exercises  chest press with wand      Shoulder Exercises: Standing   Row Limitations  Lt hand on table    Other Standing Exercises  orange ball ABCs on counter      Shoulder Exercises: Pulleys   Flexion  3 minutes    Scaption  3 minutes      Shoulder Exercises: ROM/Strengthening   Other ROM/Strengthening Exercises  wall ladder    Other ROM/Strengthening Exercises  UE ranger flexion      Vasopneumatic   Number Minutes Vasopneumatic   15 minutes    Vasopnuematic Location   Shoulder    Vasopneumatic Pressure  Low    Vasopneumatic Temperature   lowest      Manual Therapy   Passive ROM  PROM short duration             PT Education - 07/26/18 1621    Education Details  plan of care & progressing exercises, scapular mobility, exercise form/rationale     Person(s) Educated  Patient    Methods  Explanation;Demonstration;Tactile cues;Verbal cues;Handout    Comprehension  Verbalized understanding;Returned demonstration;Verbal cues required;Tactile cues required;Need further instruction       PT Short Term Goals - 06/28/18 1359      PT SHORT TERM GOAL #1   Title  pt will be I with initial HEP given     Time  4    Period  Weeks    Status  Achieved      PT SHORT TERM GOAL #2   Title  pt will be able to verbalize and demo techniques to prevent/ reduce edema/ pain via RICE and HEP)    Time  4    Period  Weeks    Status  Achieved      PT SHORT TERM GOAL #3   Title  pt will increase L knee flexion to >/= 110 degrees and extension to >/= -5 degrees for functional progression     Baseline  flexion to 115   ext still -20    Time  4    Period  Weeks    Status  Partially Met      PT SHORT TERM GOAL #4   Title  pt will be able to stand/ ambulate >/= 15 min with </= 5/10 pain with LRAD to promote functional mobililty (01/12/2017    Time  4    Period  Weeks    Status  On-going      PT SHORT TERM GOAL #5   Title  Patient will increase passive right shoulder passive ER 40 percent per protocol     Time  3    Period  Weeks    Status  New    Target Date  07/19/18      Additional Short Term Goals   Additional Short Term Goals  Yes      PT SHORT TERM GOAL #6   Title  Patient will increase right shoulder flexion by 25 degrees per protocol     Time  3    Period  Weeks    Status  New        PT Long Term Goals - 06/28/18 1400      PT LONG TERM GOAL #1   Title  pt will be I with all HEP given as of last visit     Time  12  Period  Weeks    Status  On-going      PT LONG TERM GOAL #2   Title  pt will increase L knee flexion to >/=125 degrees and extension to >/=-2 degrees with </= 2/10 pain for functional and efficient gait pattern (02/06/2017)    Time  12    Period  Weeks    Status  On-going      PT LONG TERM GOAL #3   Title  pt  will increase L knee strength to >/= 4/5 for prolonged walking/ standing activities to promote safety (02/06/2017)    Time  12    Period  Weeks    Status  On-going      PT LONG TERM GOAL #4   Title  pt will be able to walk/ stand for >/= 30 minutes and navigate >/= 15 steps with >/=1 HHA for functional endurance and work related activities     Time  12    Period  Weeks    Status  On-going      PT LONG TERM GOAL #5   Title  He will increase FOTO score by >/= 42% limited to demonstrate improvement in functionat Discharge (02/06/2017)    Time  8    Period  Weeks    Status  On-going      Additional Long Term Goals   Additional Long Term Goals  Yes      PT LONG TERM GOAL #6   Title  Patient will demsotrate 4+/5 grossright shoulder strength in order to perfrom ADL's     Time  8    Period  Weeks    Status  New    Target Date  08/23/18      PT LONG TERM GOAL #7   Title  Patient will demsotrate WNL active ER and IR on the right wihtout pain in order to perform ADL's     Time  8    Period  Weeks    Status  New    Target Date  08/23/18            Plan - 07/26/18 1617    Clinical Impression Statement  verbal and tactile cues for scapular retraction with UE movement. added wand in supine which pt performed well with some discomfort. advised to stay away from pinching whereas a soreness/discomfort should be expected. pt will begin wall walks at home. reports he was instructed to work out of his sling over the week by [name].     PT Treatment/Interventions  Manual techniques;Patient/family education;Therapeutic activities;Therapeutic exercise;Cryotherapy;Vasopneumatic Device;Passive range of motion;Taping;Functional mobility training;Gait training;Stair training;Neuromuscular re-education;Moist Heat    PT Next Visit Plan  follow protocol for RTC frepair from Dr Theda Sers.   PN    PT Home Exercise Plan  pendulums, towel squeeze, PROM IR/ER in supine, resisted triceps extension yellow; dynamic  stab ball on counter, bent over row, towel slide on table, wall walks, supine wand flx    Consulted and Agree with Plan of Care  Patient;Family member/caregiver    Family Member Consulted  Wife       Patient will benefit from skilled therapeutic intervention in order to improve the following deficits and impairments:  Pain, Decreased activity tolerance, Decreased range of motion, Decreased strength, Difficulty walking, Increased edema, Impaired UE functional use, Decreased endurance  Visit Diagnosis: Stiffness of right shoulder, not elsewhere classified  Acute pain of right shoulder     Problem List Patient Active Problem List   Diagnosis Date  Noted  . Right shoulder injury 06/15/2018  . S/P arthroscopy of right shoulder 06/15/2018  . Failed total knee arthroplasty ([place]) 04/21/2018  . OA (osteoarthritis) of knee 12/08/2016  . 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 Cervical facet joint syndrome 12/17/2015  . Chronic bilateral low back pain without sciatica 12/17/2015  . Primary osteoarthritis of left knee 12/17/2015    Sehaj Mcenroe C. Roan Sawchuk PT, DPT 07/26/18 4:22 PM   [facility] [address] [place], [place], [address] Phone: [phone]   Fax:  [phone]  Name: Daniel TABET, [name] MRN: [id number] Date of Birth: 10/26/1960

## 2018-07-27 MED FILL — AMOXICILLIN 500 MG CAPSULE: 500 | 1 days supply | Qty: 4 | Fill #0

## 2018-07-28 MED FILL — TADALAFIL 5 MG TABS: 5 | 30 days supply | Qty: 30 | Fill #5

## 2018-07-28 MED FILL — PRAVASTATIN NA 40 MG TAB: 40 | 90 days supply | Qty: 90 | Fill #2

## 2018-07-28 MED FILL — TOPIRAMATE 25 MG TAB: 25 | 90 days supply | Qty: 90 | Fill #2

## 2018-07-28 MED FILL — RIZATRIPTAN 5 MG ODT: 5 | 30 days supply | Qty: 15 | Fill #1

## 2018-07-28 MED FILL — BUTALB-ACETAMIN-CAFF 50-325: 50-325-40 | 10 days supply | Qty: 60 | Fill #1

## 2018-07-29 ENCOUNTER — Encounter: Payer: Self-pay | Admitting: Physical Therapy

## 2018-07-29 ENCOUNTER — Ambulatory Visit: Payer: 59 | Admitting: Physical Therapy

## 2018-07-29 DIAGNOSIS — M25511 Pain in right shoulder: Secondary | ICD-10-CM

## 2018-07-29 DIAGNOSIS — E1151 Type 2 diabetes mellitus with diabetic peripheral angiopathy without gangrene: Secondary | ICD-10-CM | POA: Diagnosis not present

## 2018-07-29 DIAGNOSIS — R82998 Other abnormal findings in urine: Secondary | ICD-10-CM | POA: Diagnosis not present

## 2018-07-29 DIAGNOSIS — Z125 Encounter for screening for malignant neoplasm of prostate: Secondary | ICD-10-CM | POA: Diagnosis not present

## 2018-07-29 DIAGNOSIS — M25611 Stiffness of right shoulder, not elsewhere classified: Secondary | ICD-10-CM | POA: Diagnosis not present

## 2018-07-29 DIAGNOSIS — Z0000 Encounter for general adult medical examination without abnormal findings: Secondary | ICD-10-CM | POA: Diagnosis not present

## 2018-07-29 NOTE — Therapy (Signed)
[place], [place], [address] Phone: [phone]   Fax:  [phone]  Physical Therapy Treatment  Patient Details  Name: Daniel REALI, [name] MRN: [id number] Date of Birth: May 03, 1961 Referring Provider (PT): Dr [name]    Encounter Date: 07/29/2018  PT End of Session - 07/29/18 1136    Visit Number  10    Number of Visits  27    Date for PT Re-Evaluation  08/23/18    Authorization Type   11/04/4823 recert for knee; 00/37/0488 for recert on shoulder     PT Start Time  1137    PT Stop Time  1238    PT Time Calculation (min)  61 min    Activity Tolerance  Patient tolerated treatment well    Behavior During Therapy  [facility] for tasks assessed/performed       Past Medical History:  Diagnosis Date  . Anxiety   . Aortic atherosclerosis ([place])   . BPH (benign prostatic hyperplasia)   . Chronic pain syndrome    neck and low back  . DDD (degenerative disc disease), cervical   . Diverticulosis   . DJD (degenerative joint disease)   . Ganglion cyst    12 mm , right posterior knee  . Hyperlipidemia   . Internal hemorrhoids   . Migraines   . OA (osteoarthritis)    knees, hands, right shoulder  . Positive QuantiFERON-TB Gold test   . Shoulder pain   . Tear of right rotator cuff   . Type 2 diabetes mellitus ([place])    last A1c 5.4 on 04-13-2018 in epic (followed by pcp)  . Wears glasses     Past Surgical History:  Procedure Laterality Date  . ANAL FISSURE REPAIR    . ARTHOSCOPIC ROTAOR CUFF REPAIR Right 06/15/2018   Procedure: RIGHT SHOULDER ARTHROSCOPY, DEBRIDEMENT, SUBACROMIAL DECOMPRESSION, DISTAL CLAVICLE RESECTION, ROTATOR CUFF REPAIR;  Surgeon: [name], [name];  Location: [facility];  Service: Orthopedics;  Laterality: Right;  . COLONOSCOPY  05-26-2017   dr Henrene Pastor  . Hip Resurface  2006  . KNEE ARTHROSCOPY W/ MENISCAL REPAIR Bilateral right 1993; left 2010  . MASS EXCISION Right  04/21/2018   Procedure: EXCISION RIGHT THIGH SOFT TISSUE MASS;  Surgeon: [name], [name];  Location: [facility];  Service: Orthopedics;  Laterality: Right;  . SEPTOPLASTY    . SHOULDER ARTHROSCOPY WITH ROTATOR CUFF REPAIR Left   . SLAP Tear Repair  2008  . TONSILLECTOMY    . TOTAL KNEE ARTHROPLASTY Left 12/08/2016   Procedure: LEFT TOTAL KNEE ARTHROPLASTY;  Surgeon: [name], [name];  Location[facility];  Service: Orthopedics;  Laterality: Left;  . TOTAL KNEE ARTHROPLASTY WITH REVISION COMPONENTS Left 04/21/2018   Procedure: Left knee polyethylene exchange ;  Surgeon: [name], [name];  Location[facility];  Service: Orthopedics;  Laterality: Left;    There were no vitals filed for this visit.  Subjective Assessment - 07/29/18 1137    Subjective  it hurts today. working out of sling. sharp pain noted when crawling up wall    Patient Stated Goals  Retrun to normal     Currently in Pain?  Yes    Pain Score  7     Pain Location  Shoulder    Pain Orientation  Right    Pain Descriptors / Indicators  --   sore, sharp occasional   Aggravating Factors   exercises    Pain Relieving Factors  ice  [facility] PT Treatment/Exercise - 07/29/18 0001      Shoulder Exercises: Supine   Flexion Limitations  AAROM with wand to tolerance      Shoulder Exercises: Standing   Row Limitations  Lt hand on table x20    Other Standing Exercises  pendulums      Shoulder Exercises: Pulleys   Flexion  3 minutes   x2 round     Shoulder Exercises: ROM/Strengthening   Other ROM/Strengthening Exercises  wall ladder    Other ROM/Strengthening Exercises  UE ranger flexion      Vasopneumatic   Number Minutes Vasopneumatic   15 minutes    Vasopnuematic Location   Shoulder    Vasopneumatic Pressure  Low    Vasopneumatic Temperature   lowest      Manual Therapy   Joint Mobilization  gr 2 mobs sup-inf, A-P    Soft tissue mobilization  IASTM Rt upper trap    Passive ROM   PROM to tolerance               PT Short Term Goals - 06/28/18 1359      PT SHORT TERM GOAL #1   Title  pt will be I with initial HEP given     Time  4    Period  Weeks    Status  Achieved      PT SHORT TERM GOAL #2   Title  pt will be able to verbalize and demo techniques to prevent/ reduce edema/ pain via RICE and HEP)    Time  4    Period  Weeks    Status  Achieved      PT SHORT TERM GOAL #3   Title  pt will increase L knee flexion to >/= 110 degrees and extension to >/= -5 degrees for functional progression     Baseline  flexion to 115   ext still -20    Time  4    Period  Weeks    Status  Partially Met      PT SHORT TERM GOAL #4   Title  pt will be able to stand/ ambulate >/= 15 min with </= 5/10 pain with LRAD to promote functional mobililty (01/12/2017    Time  4    Period  Weeks    Status  On-going      PT SHORT TERM GOAL #5   Title  Patient will increase passive right shoulder passive ER 40 percent per protocol     Time  3    Period  Weeks    Status  New    Target Date  07/19/18      Additional Short Term Goals   Additional Short Term Goals  Yes      PT SHORT TERM GOAL #6   Title  Patient will increase right shoulder flexion by 25 degrees per protocol     Time  3    Period  Weeks    Status  New        PT Long Term Goals - 06/28/18 1400      PT LONG TERM GOAL #1   Title  pt will be I with all HEP given as of last visit     Time  12    Period  Weeks    Status  On-going      PT LONG TERM GOAL #2   Title  pt will increase L knee flexion to >/=125 degrees and extension to >/=-2  degrees with </= 2/10 pain for functional and efficient gait pattern (02/06/2017)    Time  12    Period  Weeks    Status  On-going      PT LONG TERM GOAL #3   Title  pt will increase L knee strength to >/= 4/5 for prolonged walking/ standing activities to promote safety (02/06/2017)    Time  12    Period  Weeks    Status  On-going      PT LONG TERM GOAL #4   Title   pt will be able to walk/ stand for >/= 30 minutes and navigate >/= 15 steps with >/=1 HHA for functional endurance and work related activities     Time  12    Period  Weeks    Status  On-going      PT LONG TERM GOAL #5   Title  He will increase FOTO score by >/= 42% limited to demonstrate improvement in functionat Discharge (02/06/2017)    Time  8    Period  Weeks    Status  On-going      Additional Long Term Goals   Additional Long Term Goals  Yes      PT LONG TERM GOAL #6   Title  Patient will demsotrate 4+/5 grossright shoulder strength in order to perfrom ADL's     Time  8    Period  Weeks    Status  New    Target Date  08/23/18      PT LONG TERM GOAL #7   Title  Patient will demsotrate WNL active ER and IR on the right wihtout pain in order to perform ADL's     Time  8    Period  Weeks    Status  New    Target Date  08/23/18            Plan - 07/29/18 1243    Clinical Impression Statement  joint mobs for pain control. pt reported feeling good to stretch passively. Will be allowed to move to week 7 of protocol next week as tolerated.     PT Treatment/Interventions  Manual techniques;Patient/family education;Therapeutic activities;Therapeutic exercise;Cryotherapy;Vasopneumatic Device;Passive range of motion;Taping;Functional mobility training;Gait training;Stair training;Neuromuscular re-education;Moist Heat    PT Next Visit Plan  follow protocol for RTC frepair from Dr Theda Sers.       PT Home Exercise Plan  pendulums, towel squeeze, PROM IR/ER in supine, resisted triceps extension yellow; dynamic stab ball on counter, bent over row, towel slide on table, wall walks, supine wand flx    Consulted and Agree with Plan of Care  Patient       Patient will benefit from skilled therapeutic intervention in order to improve the following deficits and impairments:  Pain, Decreased activity tolerance, Decreased range of motion, Decreased strength, Difficulty walking, Increased  edema, Impaired UE functional use, Decreased endurance  Visit Diagnosis: Stiffness of right shoulder, not elsewhere classified  Acute pain of right shoulder     Problem List Patient Active Problem List   Diagnosis Date Noted  . Right shoulder injury 06/15/2018  . S/P arthroscopy of right shoulder 06/15/2018  . Failed total knee arthroplasty ([place]) 04/21/2018  . OA (osteoarthritis) of knee 12/08/2016  . 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 Cervical facet joint syndrome 12/17/2015  . Chronic bilateral low back pain without sciatica 12/17/2015  . Primary osteoarthritis of left knee 12/17/2015    Donnovan Stamour C. Jacqulynn Shappell PT,  DPT 07/29/18 12:47 PM   [facility] [address] [place], [place], [address] Phone: [phone]   Fax:  [phone]  Name: Daniel MASSMANN, [name] MRN: [id number] Date of Birth: May 28, 1961

## 2018-08-02 ENCOUNTER — Encounter: Payer: Self-pay | Admitting: Physical Therapy

## 2018-08-02 ENCOUNTER — Ambulatory Visit: Payer: 59 | Admitting: Physical Therapy

## 2018-08-02 DIAGNOSIS — M25611 Stiffness of right shoulder, not elsewhere classified: Secondary | ICD-10-CM

## 2018-08-02 DIAGNOSIS — R7611 Nonspecific reaction to tuberculin skin test without active tuberculosis: Secondary | ICD-10-CM | POA: Diagnosis not present

## 2018-08-02 DIAGNOSIS — Z1389 Encounter for screening for other disorder: Secondary | ICD-10-CM | POA: Diagnosis not present

## 2018-08-02 DIAGNOSIS — G43909 Migraine, unspecified, not intractable, without status migrainosus: Secondary | ICD-10-CM | POA: Diagnosis not present

## 2018-08-02 DIAGNOSIS — M25511 Pain in right shoulder: Secondary | ICD-10-CM | POA: Diagnosis not present

## 2018-08-02 DIAGNOSIS — E1151 Type 2 diabetes mellitus with diabetic peripheral angiopathy without gangrene: Secondary | ICD-10-CM | POA: Diagnosis not present

## 2018-08-02 DIAGNOSIS — E7849 Other hyperlipidemia: Secondary | ICD-10-CM | POA: Diagnosis not present

## 2018-08-02 DIAGNOSIS — M545 Low back pain: Secondary | ICD-10-CM | POA: Diagnosis not present

## 2018-08-02 DIAGNOSIS — Z6841 Body Mass Index (BMI) 40.0 and over, adult: Secondary | ICD-10-CM | POA: Diagnosis not present

## 2018-08-02 DIAGNOSIS — F5104 Psychophysiologic insomnia: Secondary | ICD-10-CM | POA: Diagnosis not present

## 2018-08-02 NOTE — Therapy (Signed)
[place], [place], [address] Phone: [phone]   Fax:  [phone]  Physical Therapy Treatment  Patient Details  Name: Daniel REBERT, [name] MRN: [id number] Date of Birth: Nov 17, 1960 Referring Provider (PT): Dr [name]    Encounter Date: 08/02/2018  PT End of Session - 08/02/18 1200    Visit Number  11    Number of Visits  27    Date for PT Re-Evaluation  08/23/18    Authorization Type   03/10/4131 recert for knee; 44/10/270 for recert on shoulder     PT Start Time  1155   pt arrived late   PT Stop Time  1247    PT Time Calculation (min)  52 min    Activity Tolerance  Patient tolerated treatment well    Behavior During Therapy  [facility] for tasks assessed/performed       Past Medical History:  Diagnosis Date  . Anxiety   . Aortic atherosclerosis ([place])   . BPH (benign prostatic hyperplasia)   . Chronic pain syndrome    neck and low back  . DDD (degenerative disc disease), cervical   . Diverticulosis   . DJD (degenerative joint disease)   . Ganglion cyst    12 mm , right posterior knee  . Hyperlipidemia   . Internal hemorrhoids   . Migraines   . OA (osteoarthritis)    knees, hands, right shoulder  . Positive QuantiFERON-TB Gold test   . Shoulder pain   . Tear of right rotator cuff   . Type 2 diabetes mellitus ([place])    last A1c 5.4 on 04-13-2018 in epic (followed by pcp)  . Wears glasses     Past Surgical History:  Procedure Laterality Date  . ANAL FISSURE REPAIR    . ARTHOSCOPIC ROTAOR CUFF REPAIR Right 06/15/2018   Procedure: RIGHT SHOULDER ARTHROSCOPY, DEBRIDEMENT, SUBACROMIAL DECOMPRESSION, DISTAL CLAVICLE RESECTION, ROTATOR CUFF REPAIR;  Surgeon: [name], [name];  Location: [facility];  Service: Orthopedics;  Laterality: Right;  . COLONOSCOPY  05-26-2017   dr Henrene Pastor  . Hip Resurface  2006  . KNEE ARTHROSCOPY W/ MENISCAL REPAIR Bilateral right 1993; left 2010  .  MASS EXCISION Right 04/21/2018   Procedure: EXCISION RIGHT THIGH SOFT TISSUE MASS;  Surgeon: [name], [name];  [facility];  Service: Orthopedics;  Laterality: Right;  . SEPTOPLASTY    . SHOULDER ARTHROSCOPY WITH ROTATOR CUFF REPAIR Left   . SLAP Tear Repair  2008  . TONSILLECTOMY    . TOTAL KNEE ARTHROPLASTY Left 12/08/2016   Procedure: LEFT TOTAL KNEE ARTHROPLASTY;  Surgeon: [name], [name];  [facility];  Service: Orthopedics;  Laterality: Left;  . TOTAL KNEE ARTHROPLASTY WITH REVISION COMPONENTS Left 04/21/2018   Procedure: Left knee polyethylene exchange ;  Surgeon: [name], [name];  [facility];  Service: Orthopedics;  Laterality: Left;    There were no vitals filed for this visit.  Subjective Assessment - 08/02/18 1200    Subjective  Wall crawling feels better after warming up but the first few are painful. was sore after last visit. sharp pains in labrum.     Patient Stated Goals  Retrun to normal                        [facility] Adult PT Treatment/Exercise - 08/02/18 0001      Exercises   Exercises  Shoulder      Shoulder Exercises:  Supine   Flexion Limitations  AAROm with wand 90-120    Other Supine Exercises  chest press with wand      Shoulder Exercises: Sidelying   External Rotation  20 reps    ABduction Limitations  sets of 5 to 45 deg      Shoulder Exercises: Standing   Other Standing Exercises  AROM flx & abd in mirror below 90      Shoulder Exercises: Pulleys   Flexion  3 minutes      Shoulder Exercises: ROM/Strengthening   Other ROM/Strengthening Exercises  wall ladder      Manual Therapy   Soft tissue mobilization  Rt pectoralis               PT Short Term Goals - 06/28/18 1359      PT SHORT TERM GOAL #1   Title  pt will be I with initial HEP given     Time  4    Period  Weeks    Status  Achieved      PT SHORT TERM GOAL #2   Title  pt will be able to verbalize and demo techniques to prevent/ reduce  edema/ pain via RICE and HEP)    Time  4    Period  Weeks    Status  Achieved      PT SHORT TERM GOAL #3   Title  pt will increase L knee flexion to >/= 110 degrees and extension to >/= -5 degrees for functional progression     Baseline  flexion to 115   ext still -20    Time  4    Period  Weeks    Status  Partially Met      PT SHORT TERM GOAL #4   Title  pt will be able to stand/ ambulate >/= 15 min with </= 5/10 pain with LRAD to promote functional mobililty (01/12/2017    Time  4    Period  Weeks    Status  On-going      PT SHORT TERM GOAL #5   Title  Patient will increase passive right shoulder passive ER 40 percent per protocol     Time  3    Period  Weeks    Status  New    Target Date  07/19/18      Additional Short Term Goals   Additional Short Term Goals  Yes      PT SHORT TERM GOAL #6   Title  Patient will increase right shoulder flexion by 25 degrees per protocol     Time  3    Period  Weeks    Status  New        PT Long Term Goals - 06/28/18 1400      PT LONG TERM GOAL #1   Title  pt will be I with all HEP given as of last visit     Time  12    Period  Weeks    Status  On-going      PT LONG TERM GOAL #2   Title  pt will increase L knee flexion to >/=125 degrees and extension to >/=-2 degrees with </= 2/10 pain for functional and efficient gait pattern (02/06/2017)    Time  12    Period  Weeks    Status  On-going      PT LONG TERM GOAL #3   Title  pt will increase L knee strength to >/= 4/5 for  prolonged walking/ standing activities to promote safety (02/06/2017)    Time  12    Period  Weeks    Status  On-going      PT LONG TERM GOAL #4   Title  pt will be able to walk/ stand for >/= 30 minutes and navigate >/= 15 steps with >/=1 HHA for functional endurance and work related activities     Time  12    Period  Weeks    Status  On-going      PT LONG TERM GOAL #5   Title  He will increase FOTO score by >/= 42% limited to demonstrate improvement in  functionat Discharge (02/06/2017)    Time  8    Period  Weeks    Status  On-going      Additional Long Term Goals   Additional Long Term Goals  Yes      PT LONG TERM GOAL #6   Title  Patient will demsotrate 4+/5 grossright shoulder strength in order to perfrom ADL's     Time  8    Period  Weeks    Status  New    Target Date  08/23/18      PT LONG TERM GOAL #7   Title  Patient will demsotrate WNL active ER and IR on the right wihtout pain in order to perform ADL's     Time  8    Period  Weeks    Status  New    Target Date  08/23/18            Plan - 08/02/18 1244    Clinical Impression Statement  began progression into AROM today which pt tolerated well. used mirror for AROM in standing and asked him to stay below 90 deg for proper rythym. tightness noted in pectoralis    PT Treatment/Interventions  Manual techniques;Patient/family education;Therapeutic activities;Therapeutic exercise;Cryotherapy;Vasopneumatic Device;Passive range of motion;Taping;Functional mobility training;Gait training;Stair training;Neuromuscular re-education;Moist Heat    PT Next Visit Plan  follow protocol for RTC frepair from Dr Theda Sers.       PT Home Exercise Plan  pendulums, towel squeeze, PROM IR/ER in supine, resisted triceps extension yellow; dynamic stab ball on counter, bent over row, towel slide on table, wall walks, supine wand flx; AROM in mirror below 90    Consulted and Agree with Plan of Care  Patient       Patient will benefit from skilled therapeutic intervention in order to improve the following deficits and impairments:  Pain, Decreased activity tolerance, Decreased range of motion, Decreased strength, Difficulty walking, Increased edema, Impaired UE functional use, Decreased endurance  Visit Diagnosis: Stiffness of right shoulder, not elsewhere classified  Acute pain of right shoulder     Problem List Patient Active Problem List   Diagnosis Date Noted  . Right shoulder injury  06/15/2018  . S/P arthroscopy of right shoulder 06/15/2018  . Failed total knee arthroplasty ([place]) 04/21/2018  . OA (osteoarthritis) of knee 12/08/2016  . 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 Cervical facet joint syndrome 12/17/2015  . Chronic bilateral low back pain without sciatica 12/17/2015  . Primary osteoarthritis of left knee 12/17/2015    Larenzo Caples C. Perle Gibbon PT, DPT 08/02/18 12:53 PM   [facility] [address] [place], [place], [address] Phone: [phone]   Fax:  [phone]  Name: Daniel RODDEY, [name] MRN: [id number] Date of Birth: 10-Sep-1961

## 2018-08-04 ENCOUNTER — Encounter: Payer: Self-pay | Admitting: Physical Therapy

## 2018-08-04 ENCOUNTER — Ambulatory Visit: Payer: 59 | Admitting: Physical Therapy

## 2018-08-04 DIAGNOSIS — M189 Osteoarthritis of first carpometacarpal joint, unspecified: Secondary | ICD-10-CM | POA: Diagnosis not present

## 2018-08-04 DIAGNOSIS — M25611 Stiffness of right shoulder, not elsewhere classified: Secondary | ICD-10-CM

## 2018-08-04 DIAGNOSIS — M1812 Unilateral primary osteoarthritis of first carpometacarpal joint, left hand: Secondary | ICD-10-CM | POA: Diagnosis not present

## 2018-08-04 DIAGNOSIS — M79644 Pain in right finger(s): Secondary | ICD-10-CM | POA: Diagnosis not present

## 2018-08-04 DIAGNOSIS — M25511 Pain in right shoulder: Secondary | ICD-10-CM | POA: Diagnosis not present

## 2018-08-04 DIAGNOSIS — M65849 Other synovitis and tenosynovitis, unspecified hand: Secondary | ICD-10-CM | POA: Diagnosis not present

## 2018-08-04 MED FILL — oxyCODONE HCL 5 MG TABS: 5 | 30 days supply | Qty: 180 | Fill #0

## 2018-08-04 MED FILL — diazePAM 5 MG TABS: 5 | 30 days supply | Qty: 60 | Fill #0

## 2018-08-04 NOTE — Therapy (Signed)
[place], [place], [address] Phone: [phone]   Fax:  [phone]  Physical Therapy Treatment  Patient Details  Name: Daniel RAYOS, [name] MRN: [id number] Date of Birth: [birth year] Referring Provider (PT): Dr [name]    Encounter Date: 08/04/2018  PT End of Session - 08/04/18 1156    Visit Number  12    Number of Visits  27    Date for PT Re-Evaluation  08/23/18    Authorization Type   03/19/3709 recert for knee; 62/69/4854 for recert on shoulder     PT Start Time  1154   pt arrived late   PT Stop Time  1250    PT Time Calculation (min)  56 min    Activity Tolerance  Patient tolerated treatment well    Behavior During Therapy  [facility] for tasks assessed/performed       Past Medical History:  Diagnosis Date  . Anxiety   . Aortic atherosclerosis ([place])   . BPH (benign prostatic hyperplasia)   . Chronic pain syndrome    neck and low back  . DDD (degenerative disc disease), cervical   . Diverticulosis   . DJD (degenerative joint disease)   . Ganglion cyst    12 mm , right posterior knee  . Hyperlipidemia   . Internal hemorrhoids   . Migraines   . OA (osteoarthritis)    knees, hands, right shoulder  . Positive QuantiFERON-TB Gold test   . Shoulder pain   . Tear of right rotator cuff   . Type 2 diabetes mellitus ([place])    last A1c 5.4 on 04-13-2018 in epic (followed by pcp)  . Wears glasses     Past Surgical History:  Procedure Laterality Date  . ANAL FISSURE REPAIR    . ARTHOSCOPIC ROTAOR CUFF REPAIR Right 06/15/2018   Procedure: RIGHT SHOULDER ARTHROSCOPY, DEBRIDEMENT, SUBACROMIAL DECOMPRESSION, DISTAL CLAVICLE RESECTION, ROTATOR CUFF REPAIR;  Surgeon: [name], [name];  Location: [facility];  Service: Orthopedics;  Laterality: Right;  . COLONOSCOPY  05-26-2017   dr Henrene Pastor  . Hip Resurface  2006  . KNEE ARTHROSCOPY W/ MENISCAL REPAIR Bilateral right 1993; left 2010  .  MASS EXCISION Right 04/21/2018   Procedure: EXCISION RIGHT THIGH SOFT TISSUE MASS;  Surgeon: [name], [name];  Location: [facility];  Service: Orthopedics;  Laterality: Right;  . SEPTOPLASTY    . SHOULDER ARTHROSCOPY WITH ROTATOR CUFF REPAIR Left   . SLAP Tear Repair  2008  . TONSILLECTOMY    . TOTAL KNEE ARTHROPLASTY Left 12/08/2016   Procedure: LEFT TOTAL KNEE ARTHROPLASTY;  Surgeon: [name], [name];  Location: [facility];  Service: Orthopedics;  Laterality: Left;  . TOTAL KNEE ARTHROPLASTY WITH REVISION COMPONENTS Left 04/21/2018   Procedure: Left knee polyethylene exchange ;  Surgeon: [name], [name];  Location: [facility];  Service: Orthopedics;  Laterality: Left;    There were no vitals filed for this visit.  Subjective Assessment - 08/04/18 1156    Subjective  pt reports he was very sore after last visit. Better today than he was yesterday    Currently in Pain?  Yes    Pain Score  6     Pain Location  Shoulder    Pain Orientation  Right                       [facility] Adult PT Treatment/Exercise - 08/04/18 0001      Exercises  Exercises  Shoulder      Shoulder Exercises: Supine   External Rotation Limitations  AAROM elbow propped    Flexion Limitations  AAROM with wand    Other Supine Exercises  rythmic stab at 90/neutral and 90 flx      Shoulder Exercises: Standing   Flexion Limitations  AAROM with wand    Row Limitations  Lt hand on table x15    Other Standing Exercises  cone stacking, ABCs red plyo ball on counter    Other Standing Exercises  scaption AAROM with wand      Shoulder Exercises: Pulleys   Flexion  3 minutes      Vasopneumatic   Number Minutes Vasopneumatic   15 minutes    Vasopnuematic Location   Shoulder    Vasopneumatic Pressure  Low    Vasopneumatic Temperature   lowest               PT Short Term Goals - 06/28/18 1359      PT SHORT TERM GOAL #1   Title  pt will be I with initial HEP given     Time  4    Period  Weeks     Status  Achieved      PT SHORT TERM GOAL #2   Title  pt will be able to verbalize and demo techniques to prevent/ reduce edema/ pain via RICE and HEP)    Time  4    Period  Weeks    Status  Achieved      PT SHORT TERM GOAL #3   Title  pt will increase L knee flexion to >/= 110 degrees and extension to >/= -5 degrees for functional progression     Baseline  flexion to 115   ext still -20    Time  4    Period  Weeks    Status  Partially Met      PT SHORT TERM GOAL #4   Title  pt will be able to stand/ ambulate >/= 15 min with </= 5/10 pain with LRAD to promote functional mobililty (01/12/2017    Time  4    Period  Weeks    Status  On-going      PT SHORT TERM GOAL #5   Title  Patient will increase passive right shoulder passive ER 40 percent per protocol     Time  3    Period  Weeks    Status  New    Target Date  07/19/18      Additional Short Term Goals   Additional Short Term Goals  Yes      PT SHORT TERM GOAL #6   Title  Patient will increase right shoulder flexion by 25 degrees per protocol     Time  3    Period  Weeks    Status  New        PT Long Term Goals - 06/28/18 1400      PT LONG TERM GOAL #1   Title  pt will be I with all HEP given as of last visit     Time  12    Period  Weeks    Status  On-going      PT LONG TERM GOAL #2   Title  pt will increase L knee flexion to >/=125 degrees and extension to >/=-2 degrees with </= 2/10 pain for functional and efficient gait pattern (02/06/2017)    Time  12  Period  Weeks    Status  On-going      PT LONG TERM GOAL #3   Title  pt will increase L knee strength to >/= 4/5 for prolonged walking/ standing activities to promote safety (02/06/2017)    Time  12    Period  Weeks    Status  On-going      PT LONG TERM GOAL #4   Title  pt will be able to walk/ stand for >/= 30 minutes and navigate >/= 15 steps with >/=1 HHA for functional endurance and work related activities     Time  12    Period  Weeks    Status   On-going      PT LONG TERM GOAL #5   Title  He will increase FOTO score by >/= 42% limited to demonstrate improvement in functionat Discharge (02/06/2017)    Time  8    Period  Weeks    Status  On-going      Additional Long Term Goals   Additional Long Term Goals  Yes      PT LONG TERM GOAL #6   Title  Patient will demsotrate 4+/5 grossright shoulder strength in order to perfrom ADL's     Time  8    Period  Weeks    Status  New    Target Date  08/23/18      PT LONG TERM GOAL #7   Title  Patient will demsotrate WNL active ER and IR on the right wihtout pain in order to perform ADL's     Time  8    Period  Weeks    Status  New    Target Date  08/23/18            Plan - 08/04/18 1237    Clinical Impression Statement  Utilized wand for AAROM in standing due to soreness after last visit which he can decrease use of wand as tolerated. reports feeling like there is sand in his shoulder and some pain at proximal biceps tendon but denies consistent cavitations.     PT Treatment/Interventions  Manual techniques;Patient/family education;Therapeutic activities;Therapeutic exercise;Cryotherapy;Vasopneumatic Device;Passive range of motion;Taping;Functional mobility training;Gait training;Stair training;Neuromuscular re-education;Moist Heat    PT Next Visit Plan  follow protocol for RTC frepair from Dr Theda Sers.       PT Home Exercise Plan  pendulums, towel squeeze, PROM IR/ER in supine, resisted triceps extension yellow; dynamic stab ball on counter, bent over row, towel slide on table, wall walks, supine wand flx; AROM in mirror below 90    Consulted and Agree with Plan of Care  Patient       Patient will benefit from skilled therapeutic intervention in order to improve the following deficits and impairments:  Pain, Decreased activity tolerance, Decreased range of motion, Decreased strength, Difficulty walking, Increased edema, Impaired UE functional use, Decreased endurance  Visit  Diagnosis: Stiffness of right shoulder, not elsewhere classified  Acute pain of right shoulder     Problem List Patient Active Problem List   Diagnosis Date Noted  . Right shoulder injury 06/15/2018  . S/P arthroscopy of right shoulder 06/15/2018  . Failed total knee arthroplasty ([place]) 04/21/2018  . OA (osteoarthritis) of knee 12/08/2016  . 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 Cervical facet joint syndrome 12/17/2015  . Chronic bilateral low back pain without sciatica 12/17/2015  . Primary osteoarthritis of left knee 12/17/2015   Keniya Schlotterbeck C. Evangelia Whitaker PT, DPT 08/04/18  12:39 PM   [place], [place], [address] Phone: [phone]   Fax:  [phone]  Name: Daniel OXLEY, [name] MRN: [id number] Date of Birth: May 17, 1961

## 2018-08-05 MED FILL — ISONIAZID 300 MG TABLET: 300 | 30 days supply | Qty: 30 | Fill #0

## 2018-08-06 DIAGNOSIS — Z1212 Encounter for screening for malignant neoplasm of rectum: Secondary | ICD-10-CM | POA: Diagnosis not present

## 2018-08-09 ENCOUNTER — Ambulatory Visit: Payer: 59 | Admitting: Physical Therapy

## 2018-08-09 ENCOUNTER — Encounter: Payer: Self-pay | Admitting: Physical Therapy

## 2018-08-09 DIAGNOSIS — M25511 Pain in right shoulder: Secondary | ICD-10-CM | POA: Diagnosis not present

## 2018-08-09 DIAGNOSIS — M25611 Stiffness of right shoulder, not elsewhere classified: Secondary | ICD-10-CM

## 2018-08-09 NOTE — Therapy (Signed)
[place], [place], [address] Phone: [phone]   Fax:  [phone]  Physical Therapy Treatment  Patient Details  Name: Daniel LUCE, MD MRN: [id number] Date of Birth: 1960/10/21 Referring Provider (PT): Dr [name]    Encounter Date: 08/09/2018  PT End of Session - 08/09/18 1257    Visit Number  13    Number of Visits  27    Date for PT Re-Evaluation  08/23/18    Authorization Type   06/27/568 recert for knee; 79/48/0165 for recert on shoulder     PT Start Time  1153   pt arrived late   PT Stop Time  1245    PT Time Calculation (min)  52 min    Activity Tolerance  Patient tolerated treatment well    Behavior During Therapy  [facility] for tasks assessed/performed       Past Medical History:  Diagnosis Date  . Anxiety   . Aortic atherosclerosis ([place])   . BPH (benign prostatic hyperplasia)   . Chronic pain syndrome    neck and low back  . DDD (degenerative disc disease), cervical   . Diverticulosis   . DJD (degenerative joint disease)   . Ganglion cyst    12 mm , right posterior knee  . Hyperlipidemia   . Internal hemorrhoids   . Migraines   . OA (osteoarthritis)    knees, hands, right shoulder  . Positive QuantiFERON-TB Gold test   . Shoulder pain   . Tear of right rotator cuff   . Type 2 diabetes mellitus ([place])    last A1c 5.4 on 04-13-2018 in epic (followed by pcp)  . Wears glasses     Past Surgical History:  Procedure Laterality Date  . ANAL FISSURE REPAIR    . ARTHOSCOPIC ROTAOR CUFF REPAIR Right 06/15/2018   Procedure: RIGHT SHOULDER ARTHROSCOPY, DEBRIDEMENT, SUBACROMIAL DECOMPRESSION, DISTAL CLAVICLE RESECTION, ROTATOR CUFF REPAIR;  Surgeon: [name], MD;  Location: [facility];  Service: Orthopedics;  Laterality: Right;  . COLONOSCOPY  05-26-2017   dr Henrene Pastor  . Hip Resurface  2006  . KNEE ARTHROSCOPY W/ MENISCAL REPAIR Bilateral right 1993; left 2010  .  MASS EXCISION Right 04/21/2018   Procedure: EXCISION RIGHT THIGH SOFT TISSUE MASS;  Surgeon: [name], MD;  Location[facility];  Service: Orthopedics;  Laterality: Right;  . SEPTOPLASTY    . SHOULDER ARTHROSCOPY WITH ROTATOR CUFF REPAIR Left   . SLAP Tear Repair  2008  . TONSILLECTOMY    . 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 TOTAL KNEE ARTHROPLASTY WITH REVISION COMPONENTS Left 04/21/2018   Procedure: Left knee polyethylene exchange ;  Surgeon: [name], MD;  Location[facility];  Service: Orthopedics;  Laterality: Left;    There were no vitals filed for this visit.  Subjective Assessment - 08/09/18 1157    Subjective  Was about 7/10 prior to meds this morning, now about 4/10. Did AAROM & AROM exercises at home, is not icing as often    Patient Stated Goals  Retrun to normal     Currently in Pain?  Yes    Pain Score  4     Pain Location  Shoulder    Pain Orientation  Right    Pain Descriptors / Indicators  Sore    Aggravating Factors   movements,  rain    Pain Relieving Factors  rest, ice                       [facility] Adult PT Treatment/Exercise - 08/09/18 0001      Exercises   Exercises  Shoulder      Shoulder Exercises: Supine   Flexion Limitations  full arc with wand      Shoulder Exercises: Sidelying   External Rotation  Right;20 reps    ABduction Limitations  to 50 deg, palm down      Shoulder Exercises: Standing   Row Limitations  prone row to triceps kick    Other Standing Exercises  AAROM in mirror with wand      Shoulder Exercises: ROM/Strengthening   Other ROM/Strengthening Exercises  wall ladder      Shoulder Exercises: Body Blade   External Rotation Limitations  3x30s at neutral      Vasopneumatic   Number Minutes Vasopneumatic   15 minutes    Vasopnuematic Location   Shoulder    Vasopneumatic  Pressure  Low    Vasopneumatic Temperature   lowest      Manual Therapy   Soft tissue mobilization  R pectoralis, subscap,                PT Short Term Goals - 06/28/18 1359      PT SHORT TERM GOAL #1   Title  pt will be I with initial HEP given     Time  4    Period  Weeks    Status  Achieved      PT SHORT TERM GOAL #2   Title  pt will be able to verbalize and demo techniques to prevent/ reduce edema/ pain via RICE and HEP)    Time  4    Period  Weeks    Status  Achieved      PT SHORT TERM GOAL #3   Title  pt will increase L knee flexion to >/= 110 degrees and extension to >/= -5 degrees for functional progression     Baseline  flexion to 115   ext still -20    Time  4    Period  Weeks    Status  Partially Met      PT SHORT TERM GOAL #4   Title  pt will be able to stand/ ambulate >/= 15 min with </= 5/10 pain with LRAD to promote functional mobililty (01/12/2017    Time  4    Period  Weeks    Status  On-going      PT SHORT TERM GOAL #5   Title  Patient will increase passive right shoulder passive ER 40 percent per protocol     Time  3    Period  Weeks    Status  New    Target Date  07/19/18      Additional Short Term Goals   Additional Short Term Goals  Yes      PT SHORT TERM GOAL #6   Title  Patient will increase right shoulder flexion by 25 degrees per protocol     Time  3    Period  Weeks    Status  New        PT Long Term Goals - 06/28/18 1400      PT LONG TERM GOAL #1   Title  pt will be I with all HEP given as of last visit  Time  12    Period  Weeks    Status  On-going      PT LONG TERM GOAL #2   Title  pt will increase L knee flexion to >/=125 degrees and extension to >/=-2 degrees with </= 2/10 pain for functional and efficient gait pattern (02/06/2017)    Time  12    Period  Weeks    Status  On-going      PT LONG TERM GOAL #3   Title  pt will increase L knee strength to >/= 4/5 for prolonged walking/ standing activities to  promote safety (02/06/2017)    Time  12    Period  Weeks    Status  On-going      PT LONG TERM GOAL #4   Title  pt will be able to walk/ stand for >/= 30 minutes and navigate >/= 15 steps with >/=1 HHA for functional endurance and work related activities     Time  12    Period  Weeks    Status  On-going      PT LONG TERM GOAL #5   Title  He will increase FOTO score by >/= 42% limited to demonstrate improvement in functionat Discharge (02/06/2017)    Time  8    Period  Weeks    Status  On-going      Additional Long Term Goals   Additional Long Term Goals  Yes      PT LONG TERM GOAL #6   Title  Patient will demsotrate 4+/5 grossright shoulder strength in order to perfrom ADL's     Time  8    Period  Weeks    Status  New    Target Date  08/23/18      PT LONG TERM GOAL #7   Title  Patient will demsotrate WNL active ER and IR on the right wihtout pain in order to perform ADL's     Time  8    Period  Weeks    Status  New    Target Date  08/23/18            Plan - 08/09/18 1254    Clinical Impression Statement  Significant tightness noted in upper trap, pec minor and subscap resulting in poor biomechanics. Pt is able to demo good ROM control to approx 60 deg and then feels pain in anterior-superior aspect. He reports it feels like the labrum but denies any clicking sounds. Advised that it is ok to have a massage but do not let therapist pull arm behind back. Limited deltoid strength that requires further addressing.     PT Treatment/Interventions  Manual techniques;Patient/family education;Therapeutic activities;Therapeutic exercise;Cryotherapy;Vasopneumatic Device;Passive range of motion;Taping;Functional mobility training;Gait training;Stair training;Neuromuscular re-education;Moist Heat    PT Next Visit Plan  follow protocol for RTC frepair from Dr Theda Sers.   add wall push ups, cont body blade, ball on wall flexion    PT Home Exercise Plan  pendulums, towel squeeze, PROM IR/ER  in supine, resisted triceps extension yellow; dynamic stab ball on counter, bent over row, towel slide on table, wall walks, supine wand flx; AROM in mirror below 90    Consulted and Agree with Plan of Care  Patient       Patient will benefit from skilled therapeutic intervention in order to improve the following deficits and impairments:  Pain, Decreased activity tolerance, Decreased range of motion, Decreased strength, Difficulty walking, Increased edema, Impaired UE functional use, Decreased endurance  Visit Diagnosis:  Stiffness of right shoulder, not elsewhere classified  Acute pain of right shoulder     Problem List Patient Active Problem List   Diagnosis Date Noted  . Right shoulder injury 06/15/2018  . S/P arthroscopy of right shoulder 06/15/2018  . Failed total knee arthroplasty ([place]) 04/21/2018  . OA (osteoarthritis) of knee 12/08/2016  . 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 Cervical facet joint syndrome 12/17/2015  . Chronic bilateral low back pain without sciatica 12/17/2015  . Primary osteoarthritis of left knee 12/17/2015   Tashina Credit C. Roselina Burgueno PT, DPT 08/09/18 12:58 PM   [facility] [address] [place], [place], [address] Phone: [phone]   Fax:  [phone]  Name: DERRAL COLUCCI, MD MRN: [id number] Date of Birth: 1961/04/07

## 2018-08-13 ENCOUNTER — Ambulatory Visit: Payer: 59 | Attending: Student | Admitting: Physical Therapy

## 2018-08-13 ENCOUNTER — Encounter: Payer: Self-pay | Admitting: Physical Therapy

## 2018-08-13 DIAGNOSIS — M25611 Stiffness of right shoulder, not elsewhere classified: Secondary | ICD-10-CM | POA: Diagnosis not present

## 2018-08-13 DIAGNOSIS — M25511 Pain in right shoulder: Secondary | ICD-10-CM | POA: Insufficient documentation

## 2018-08-13 NOTE — Therapy (Signed)
[place], [place], [address] Phone: [phone]   Fax:  [phone]  Physical Therapy Treatment  Patient Details  Name: Daniel ZAWADZKI, [name] MRN: [id number] Date of Birth: 1961/05/28 Referring Provider (PT): Dr [name]    Encounter Date: 08/13/2018  PT End of Session - 08/13/18 1102    Visit Number  14    Number of Visits  27    Date for PT Re-Evaluation  08/23/18    Authorization Type   0/27/2536 recert for knee; 64/40/3474 for recert on shoulder     PT Start Time  1101    PT Stop Time  1156    PT Time Calculation (min)  55 min    Activity Tolerance  Patient tolerated treatment well    Behavior During Therapy  [facility] for tasks assessed/performed       Past Medical History:  Diagnosis Date  . Anxiety   . Aortic atherosclerosis ([place])   . BPH (benign prostatic hyperplasia)   . Chronic pain syndrome    neck and low back  . DDD (degenerative disc disease), cervical   . Diverticulosis   . DJD (degenerative joint disease)   . Ganglion cyst    12 mm , right posterior knee  . Hyperlipidemia   . Internal hemorrhoids   . Migraines   . OA (osteoarthritis)    knees, hands, right shoulder  . Positive QuantiFERON-TB Gold test   . Shoulder pain   . Tear of right rotator cuff   . Type 2 diabetes mellitus ([place])    last A1c 5.4 on 04-13-2018 in epic (followed by pcp)  . Wears glasses     Past Surgical History:  Procedure Laterality Date  . ANAL FISSURE REPAIR    . ARTHOSCOPIC ROTAOR CUFF REPAIR Right 06/15/2018   Procedure: RIGHT SHOULDER ARTHROSCOPY, DEBRIDEMENT, SUBACROMIAL DECOMPRESSION, DISTAL CLAVICLE RESECTION, ROTATOR CUFF REPAIR;  Surgeon: [name], [name];  Location: [place];  Service: Orthopedics;  Laterality: Right;  . COLONOSCOPY  05-26-2017   dr Henrene Pastor  . Hip Resurface  2006  . KNEE ARTHROSCOPY W/ MENISCAL REPAIR Bilateral right 1993; left 2010  . MASS EXCISION Right  04/21/2018   Procedure: EXCISION RIGHT THIGH SOFT TISSUE MASS;  Surgeon: [name], [name];  Location[facility];  Service: Orthopedics;  Laterality: Right;  . SEPTOPLASTY    . SHOULDER ARTHROSCOPY WITH ROTATOR CUFF REPAIR Left   . SLAP Tear Repair  2008  . TONSILLECTOMY    . TOTAL KNEE ARTHROPLASTY Left 12/08/2016   Procedure: LEFT TOTAL KNEE ARTHROPLASTY;  Surgeon: [name], [name];  Location[facility];  Service: Orthopedics;  Laterality: Left;  . TOTAL KNEE ARTHROPLASTY WITH REVISION COMPONENTS Left 04/21/2018   Procedure: Left knee polyethylene exchange ;  Surgeon: [name], [name];  Location[facility];  Service: Orthopedics;  Laterality: Left;    There were no vitals filed for this visit.  Subjective Assessment - 08/13/18 1103    Subjective  Stiff today. 02/15/09 prior to meds    Currently in Pain?  Yes         [facility] PT Assessment - 08/13/18 0001      AROM   AROM Assessment Site  Shoulder    Right/Left Shoulder  Right    Right Shoulder Flexion  80 Degrees    Right Shoulder ABduction  82 Degrees      PROM   Right Shoulder Flexion  152 Degrees   supine with wand  [place] Adult PT Treatment/Exercise - 08/13/18 0001      Exercises   Exercises  Shoulder      Shoulder Exercises: Supine   Flexion Limitations  AAROM full arc with wand      Shoulder Exercises: Seated   Flexion Limitations  from arm rested on table for higher ranges      Shoulder Exercises: Sidelying   External Rotation  Right;20 reps    External Rotation Weight (lbs)  2    ABduction  AROM;Right   PT guarded     Shoulder Exercises: Standing   Flexion Limitations  AAROM with wand in mirror    ABduction Limitations  AAROM with wand in mirror    Row  20 reps    Theraband Level (Shoulder Row)  Level 2 (Red)    Other Standing Exercises  flexion ball roll up wall      Shoulder Exercises: Pulleys   Flexion  2 minutes      Vasopneumatic   Number Minutes Vasopneumatic   15 minutes     Vasopnuematic Location   Shoulder    Vasopneumatic Pressure  Low    Vasopneumatic Temperature   lowest      Manual Therapy   Joint Mobilization  scapular mobs, sidelying GHJ mobs    Soft tissue mobilization  periscap               PT Short Term Goals - 06/28/18 1359      PT SHORT TERM GOAL #1   Title  pt will be I with initial HEP given     Time  4    Period  Weeks    Status  Achieved      PT SHORT TERM GOAL #2   Title  pt will be able to verbalize and demo techniques to prevent/ reduce edema/ pain via RICE and HEP)    Time  4    Period  Weeks    Status  Achieved      PT SHORT TERM GOAL #3   Title  pt will increase L knee flexion to >/= 110 degrees and extension to >/= -5 degrees for functional progression     Baseline  flexion to 115   ext still -20    Time  4    Period  Weeks    Status  Partially Met      PT SHORT TERM GOAL #4   Title  pt will be able to stand/ ambulate >/= 15 min with </= 5/10 pain with LRAD to promote functional mobililty (01/12/2017    Time  4    Period  Weeks    Status  On-going      PT SHORT TERM GOAL #5   Title  Patient will increase passive right shoulder passive ER 40 percent per protocol     Time  3    Period  Weeks    Status  New    Target Date  07/19/18      Additional Short Term Goals   Additional Short Term Goals  Yes      PT SHORT TERM GOAL #6   Title  Patient will increase right shoulder flexion by 25 degrees per protocol     Time  3    Period  Weeks    Status  New        PT Long Term Goals - 06/28/18 1400      PT LONG TERM GOAL #1   Title  pt  will be I with all HEP given as of last visit     Time  12    Period  Weeks    Status  On-going      PT LONG TERM GOAL #2   Title  pt will increase L knee flexion to >/=125 degrees and extension to >/=-2 degrees with </= 2/10 pain for functional and efficient gait pattern (02/06/2017)    Time  12    Period  Weeks    Status  On-going      PT LONG TERM GOAL #3   Title   pt will increase L knee strength to >/= 4/5 for prolonged walking/ standing activities to promote safety (02/06/2017)    Time  12    Period  Weeks    Status  On-going      PT LONG TERM GOAL #4   Title  pt will be able to walk/ stand for >/= 30 minutes and navigate >/= 15 steps with >/=1 HHA for functional endurance and work related activities     Time  12    Period  Weeks    Status  On-going      PT LONG TERM GOAL #5   Title  He will increase FOTO score by >/= 42% limited to demonstrate improvement in functionat Discharge (02/06/2017)    Time  8    Period  Weeks    Status  On-going      Additional Long Term Goals   Additional Long Term Goals  Yes      PT LONG TERM GOAL #6   Title  Patient will demsotrate 4+/5 grossright shoulder strength in order to perfrom ADL's     Time  8    Period  Weeks    Status  New    Target Date  08/23/18      PT LONG TERM GOAL #7   Title  Patient will demsotrate WNL active ER and IR on the right wihtout pain in order to perform ADL's     Time  8    Period  Weeks    Status  New    Target Date  08/23/18            Plan - 08/13/18 1141    Clinical Impression Statement  Major goal of this phase is achieving full AROM. Pt reports he feels that his shoulder stiffens up after exercises. Scapular mobility today to improve glide but pt does demo good control of scapulo-humeral rythm. Added small weight to sidelying ER and bent over row and encouraged AAROM in the mirror to gradually move toward unassisted full arc of motion    PT Treatment/Interventions  Manual techniques;Patient/family education;Therapeutic activities;Therapeutic exercise;Cryotherapy;Vasopneumatic Device;Passive range of motion;Taping;Functional mobility training;Gait training;Stair training;Neuromuscular re-education;Moist Heat    PT Next Visit Plan  follow protocol for RTC frepair from Dr Theda Sers.   add wall push ups, cont body blade as tol   Full AROM    PT Home Exercise Plan   pendulums, towel squeeze, PROM IR/ER in supine, resisted triceps extension yellow; dynamic stab ball on counter, bent over row, towel slide on table, wall walks, supine wand flx; AROM in mirror below 90    Consulted and Agree with Plan of Care  Patient       Patient will benefit from skilled therapeutic intervention in order to improve the following deficits and impairments:  Pain, Decreased activity tolerance, Decreased range of motion, Decreased strength, Difficulty walking, Increased edema, Impaired UE functional  use, Decreased endurance  Visit Diagnosis: Stiffness of right shoulder, not elsewhere classified  Acute pain of right shoulder     Problem List Patient Active Problem List   Diagnosis Date Noted  . Right shoulder injury 06/15/2018  . S/P arthroscopy of right shoulder 06/15/2018  . Failed total knee arthroplasty ([place]) 04/21/2018  . OA (osteoarthritis) of knee 12/08/2016  . 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 Cervical facet joint syndrome 12/17/2015  . Chronic bilateral low back pain without sciatica 12/17/2015  . Primary osteoarthritis of left knee 12/17/2015    Nahun Kronberg C. Landrum Carbonell PT, DPT 08/13/18 11:44 AM   [place] [facility] [address] [place], [place], [address] Phone: [phone]   Fax:  [phone]  Name: Daniel BIENVENUE, [name] MRN: [id number] Date of Birth: [birth year]

## 2018-08-16 ENCOUNTER — Ambulatory Visit: Payer: 59 | Admitting: Physical Therapy

## 2018-08-16 ENCOUNTER — Encounter: Payer: Self-pay | Admitting: Physical Therapy

## 2018-08-16 DIAGNOSIS — M25611 Stiffness of right shoulder, not elsewhere classified: Secondary | ICD-10-CM | POA: Diagnosis not present

## 2018-08-16 DIAGNOSIS — M25511 Pain in right shoulder: Secondary | ICD-10-CM

## 2018-08-16 NOTE — Therapy (Signed)
[name], [place], [address] Phone: [phone]   Fax:  [phone]  Physical Therapy Treatment  Patient Details  Name: Daniel LAGRANGE, MD MRN: [id number] Date of Birth: 14-Dec-1960 Referring Provider (PT): Dr [name]    Encounter Date: 08/16/2018  PT End of Session - 08/16/18 1242    Visit Number  15    Number of Visits  27    Date for PT Re-Evaluation  08/23/18    Authorization Type   02/28/8415 recert for knee; 60/63/0160 for recert on shoulder     PT Start Time  1153   pt arrived late   PT Stop Time  1244    PT Time Calculation (min)  51 min    Activity Tolerance  Patient tolerated treatment well    Behavior During Therapy  [facility] for tasks assessed/performed       Past Medical History:  Diagnosis Date  . Anxiety   . Aortic atherosclerosis ([place])   . BPH (benign prostatic hyperplasia)   . Chronic pain syndrome    neck and low back  . DDD (degenerative disc disease), cervical   . Diverticulosis   . DJD (degenerative joint disease)   . Ganglion cyst    12 mm , right posterior knee  . Hyperlipidemia   . Internal hemorrhoids   . Migraines   . OA (osteoarthritis)    knees, hands, right shoulder  . Positive QuantiFERON-TB Gold test   . Shoulder pain   . Tear of right rotator cuff   . Type 2 diabetes mellitus ([place])    last A1c 5.4 on 04-13-2018 in epic (followed by pcp)  . Wears glasses     Past Surgical History:  Procedure Laterality Date  . ANAL FISSURE REPAIR    . ARTHOSCOPIC ROTAOR CUFF REPAIR Right 06/15/2018   Procedure: RIGHT SHOULDER ARTHROSCOPY, DEBRIDEMENT, SUBACROMIAL DECOMPRESSION, DISTAL CLAVICLE RESECTION, ROTATOR CUFF REPAIR;  Surgeon: [name], MD;  Location: [place];  Service: Orthopedics;  Laterality: Right;  . COLONOSCOPY  05-26-2017   dr Henrene Pastor  . Hip Resurface  2006  . KNEE ARTHROSCOPY W/ MENISCAL REPAIR Bilateral right 1993; left 2010  .  MASS EXCISION Right 04/21/2018   Procedure: EXCISION RIGHT THIGH SOFT TISSUE MASS;  Surgeon: [name], MD;  [facility];  Service: Orthopedics;  Laterality: Right;  . SEPTOPLASTY    . SHOULDER ARTHROSCOPY WITH ROTATOR CUFF REPAIR Left   . SLAP Tear Repair  2008  . TONSILLECTOMY    . 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 TOTAL KNEE ARTHROPLASTY WITH REVISION COMPONENTS Left 04/21/2018   Procedure: Left knee polyethylene exchange ;  Surgeon: [name], MD;  [facility];  Service: Orthopedics;  Laterality: Left;    There were no vitals filed for this visit.  Subjective Assessment - 08/16/18 1155    Subjective  Burning in the labrum today. Denies popping/clicking. was tight apprxox c3-7 into upper trap and supraspinatus but did decrease with stretching.     Currently in Pain?  Yes    Pain Score  6    before meds   Pain Location  Shoulder    Pain Orientation  Right    Pain Descriptors / Indicators  Burning    Aggravating Factors   ROM    Pain Relieving Factors  rest  [facility] PT Assessment - 08/16/18 0001      Assessment   Medical Diagnosis  Right RTC repair     Referring Provider (PT)  Dr [name]     Onset Date/Surgical Date  06/15/18                   [facility] Adult PT Treatment/Exercise - 08/16/18 0001      Exercises   Exercises  Shoulder      Shoulder Exercises: Supine   Flexion Limitations  AAROM full arc with wand      Shoulder Exercises: Seated   External Rotation Limitations  yellow tband- Rt arm still/Lt arm pull into ER    Flexion Limitations  from arm rested on table for higher ranges    Other Seated Exercises  biceps curls 2lb      Shoulder Exercises: Standing   Other Standing Exercises  physioball: roll up inclined table, rainbows on table      Shoulder Exercises: Stretch   Other  Shoulder Stretches  low pec stretch in door      Vasopneumatic   Number Minutes Vasopneumatic   15 minutes    Vasopnuematic Location   Shoulder    Vasopneumatic Pressure  Low    Vasopneumatic Temperature   lowest      Manual Therapy   Soft tissue mobilization  pec minor STM/IASTM/stretching               PT Short Term Goals - 06/28/18 1359      PT SHORT TERM GOAL #1   Title  pt will be I with initial HEP given     Time  4    Period  Weeks    Status  Achieved      PT SHORT TERM GOAL #2   Title  pt will be able to verbalize and demo techniques to prevent/ reduce edema/ pain via RICE and HEP)    Time  4    Period  Weeks    Status  Achieved      PT SHORT TERM GOAL #3   Title  pt will increase L knee flexion to >/= 110 degrees and extension to >/= -5 degrees for functional progression     Baseline  flexion to 115   ext still -20    Time  4    Period  Weeks    Status  Partially Met      PT SHORT TERM GOAL #4   Title  pt will be able to stand/ ambulate >/= 15 min with </= 5/10 pain with LRAD to promote functional mobililty (01/12/2017    Time  4    Period  Weeks    Status  On-going      PT SHORT TERM GOAL #5   Title  Patient will increase passive right shoulder passive ER 40 percent per protocol     Time  3    Period  Weeks    Status  New    Target Date  07/19/18      Additional Short Term Goals   Additional Short Term Goals  Yes      PT SHORT TERM GOAL #6   Title  Patient will increase right shoulder flexion by 25 degrees per protocol     Time  3    Period  Weeks    Status  New        PT Long Term Goals - 06/28/18 1400  PT LONG TERM GOAL #1   Title  pt will be I with all HEP given as of last visit     Time  12    Period  Weeks    Status  On-going      PT LONG TERM GOAL #2   Title  pt will increase L knee flexion to >/=125 degrees and extension to >/=-2 degrees with </= 2/10 pain for functional and efficient gait pattern (02/06/2017)    Time  12     Period  Weeks    Status  On-going      PT LONG TERM GOAL #3   Title  pt will increase L knee strength to >/= 4/5 for prolonged walking/ standing activities to promote safety (02/06/2017)    Time  12    Period  Weeks    Status  On-going      PT LONG TERM GOAL #4   Title  pt will be able to walk/ stand for >/= 30 minutes and navigate >/= 15 steps with >/=1 HHA for functional endurance and work related activities     Time  12    Period  Weeks    Status  On-going      PT LONG TERM GOAL #5   Title  He will increase FOTO score by >/= 42% limited to demonstrate improvement in functionat Discharge (02/06/2017)    Time  8    Period  Weeks    Status  On-going      Additional Long Term Goals   Additional Long Term Goals  Yes      PT LONG TERM GOAL #6   Title  Patient will demsotrate 4+/5 grossright shoulder strength in order to perfrom ADL's     Time  8    Period  Weeks    Status  New    Target Date  08/23/18      PT LONG TERM GOAL #7   Title  Patient will demsotrate WNL active ER and IR on the right wihtout pain in order to perform ADL's     Time  8    Period  Weeks    Status  New    Target Date  08/23/18            Plan - 08/16/18 1244    Clinical Impression Statement  Significant tightness in pec minor noted- IASTM to decrease and added gentle, low door stretch. ERO at next visit    PT Treatment/Interventions  Manual techniques;Patient/family education;Therapeutic activities;Therapeutic exercise;Cryotherapy;Vasopneumatic Device;Passive range of motion;Taping;Functional mobility training;Gait training;Stair training;Neuromuscular re-education;Moist Heat    PT Next Visit Plan  follow protocol for RTC frepair from Dr Theda Sers.   ERO    PT Home Exercise Plan  pendulums, towel squeeze, PROM IR/ER in supine, resisted triceps extension yellow; dynamic stab ball on counter, bent over row, towel slide on table, wall walks, supine wand flx; AROM in mirror below 90    Consulted and  Agree with Plan of Care  Patient       Patient will benefit from skilled therapeutic intervention in order to improve the following deficits and impairments:  Pain, Decreased activity tolerance, Decreased range of motion, Decreased strength, Difficulty walking, Increased edema, Impaired UE functional use, Decreased endurance  Visit Diagnosis: Stiffness of right shoulder, not elsewhere classified  Acute pain of right shoulder     Problem List Patient Active Problem List   Diagnosis Date Noted  . Right shoulder injury 06/15/2018  . S/P  arthroscopy of right shoulder 06/15/2018  . Failed total knee arthroplasty ([place]) 04/21/2018  . OA (osteoarthritis) of knee 12/08/2016  . 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 Cervical facet joint syndrome 12/17/2015  . Chronic bilateral low back pain without sciatica 12/17/2015  . Primary osteoarthritis of left knee 12/17/2015    Davontae Prusinski C. Shaya Reddick PT, DPT 08/16/18 12:48 PM  [facility] [address] [place], [place], [address] Phone: [phone]   Fax:  [phone]  Name: MARSHAUN LORTIE, MD MRN: [id number] Date of Birth: September 18, 1961

## 2018-08-20 ENCOUNTER — Encounter: Payer: Self-pay | Admitting: Physical Therapy

## 2018-08-20 ENCOUNTER — Ambulatory Visit: Payer: 59 | Admitting: Physical Therapy

## 2018-08-20 DIAGNOSIS — M25611 Stiffness of right shoulder, not elsewhere classified: Secondary | ICD-10-CM | POA: Diagnosis not present

## 2018-08-20 DIAGNOSIS — M25511 Pain in right shoulder: Secondary | ICD-10-CM | POA: Diagnosis not present

## 2018-08-20 NOTE — Therapy (Signed)
[place], [place], [address] Phone: [phone]   Fax:  [phone]  Physical Therapy Treatment  Patient Details  Name: Daniel JUARBE, MD MRN: [id number] Date of Birth: [birth year] Referring Provider (PT): Dr [name]    Encounter Date: 08/20/2018  PT End of Session - 08/20/18 1100    Visit Number  16    Number of Visits  27    Date for PT Re-Evaluation  08/23/18    Authorization Type   5/72/6203 recert for knee; 55/97/4163 for recert on shoulder     PT Start Time  1059    PT Stop Time  1155    PT Time Calculation (min)  56 min    Activity Tolerance  Patient tolerated treatment well    Behavior During Therapy  [facility] for tasks assessed/performed       Past Medical History:  Diagnosis Date  . Anxiety   . Aortic atherosclerosis ([place])   . BPH (benign prostatic hyperplasia)   . Chronic pain syndrome    neck and low back  . DDD (degenerative disc disease), cervical   . Diverticulosis   . DJD (degenerative joint disease)   . Ganglion cyst    12 mm , right posterior knee  . Hyperlipidemia   . Internal hemorrhoids   . Migraines   . OA (osteoarthritis)    knees, hands, right shoulder  . Positive QuantiFERON-TB Gold test   . Shoulder pain   . Tear of right rotator cuff   . Type 2 diabetes mellitus ([place])    last A1c 5.4 on 04-13-2018 in epic (followed by pcp)  . Wears glasses     Past Surgical History:  Procedure Laterality Date  . ANAL FISSURE REPAIR    . ARTHOSCOPIC ROTAOR CUFF REPAIR Right 06/15/2018   Procedure: RIGHT SHOULDER ARTHROSCOPY, DEBRIDEMENT, SUBACROMIAL DECOMPRESSION, DISTAL CLAVICLE RESECTION, ROTATOR CUFF REPAIR;  Surgeon: [name], MD;  Location: [place];  Service: Orthopedics;  Laterality: Right;  . COLONOSCOPY  05-26-2017   dr Henrene Pastor  . Hip Resurface  2006  . KNEE ARTHROSCOPY W/ MENISCAL REPAIR Bilateral right 1993; left 2010  . MASS EXCISION Right  04/21/2018   Procedure: EXCISION RIGHT THIGH SOFT TISSUE MASS;  Surgeon: [name], MD;  Location[facility];  Service: Orthopedics;  Laterality: Right;  . SEPTOPLASTY    . SHOULDER ARTHROSCOPY WITH ROTATOR CUFF REPAIR Left   . SLAP Tear Repair  2008  . TONSILLECTOMY    . 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 TOTAL KNEE ARTHROPLASTY WITH REVISION COMPONENTS Left 04/21/2018   Procedure: Left knee polyethylene exchange ;  Surgeon: [name], MD;  Location[facility];  Service: Orthopedics;  Laterality: Left;    There were no vitals filed for this visit.  Subjective Assessment - 08/20/18 1100    Subjective  A little sore. Decrease in burning sensation. My arm feels swollen    Currently in Pain?  Yes    Pain Location  Shoulder    Pain Descriptors / Indicators  Sore         [facility] PT Assessment - 08/20/18 0001      Assessment   Medical Diagnosis  Right RTC repair     Referring Provider (PT)  Dr [name]     Onset Date/Surgical Date  06/15/18  AROM   Right Shoulder Flexion  108 Degrees   feels weak and unsteady at that point   Right Shoulder ABduction  104 Degrees   feels labral pinching at that point     PROM   PROM Assessment Site  --   [facility]                  [facility] Adult PT Treatment/Exercise - 08/20/18 0001      Exercises   Exercises  Shoulder      Shoulder Exercises: Supine   Protraction  Right;20 reps    Flexion Limitations  AROM full arc    Other Supine Exercises  supine ABCs      Shoulder Exercises: Standing   Protraction Limitations  press ball into wall at highest point of flx and small circles    Extension Limitations  ext press ball into wall with small ABD/ADD      Shoulder Exercises: Pulleys   Flexion  2 minutes   fwd reach     Shoulder Exercises: ROM/Strengthening   Other ROM/Strengthening  Exercises  UE ranger with scap motion assist by PT      Shoulder Exercises: Body Blade   ABduction Limitations  2x30s at side    External Rotation Limitations  2x30s neutral      Vasopneumatic   Number Minutes Vasopneumatic   15 minutes    Vasopnuematic Location   Shoulder    Vasopneumatic Pressure  Low      Manual Therapy   Joint Mobilization  GHJ AP    Soft tissue mobilization  pec minor/major    Passive ROM  abduction & ER stretching               PT Short Term Goals - 08/20/18 1106      PT SHORT TERM GOAL #1   Title  pt will be I with initial HEP given     Status  Achieved      PT SHORT TERM GOAL #2   Title  pt will be able to verbalize and demo techniques to prevent/ reduce edema/ pain via RICE and HEP)    Status  Achieved      PT SHORT TERM GOAL #3   Title  pt will increase L knee flexion to >/= 110 degrees and extension to >/= -5 degrees for functional progression     Status  Partially Met      PT SHORT TERM GOAL #4   Title  pt will be able to stand/ ambulate >/= 15 min with </= 5/10 pain with LRAD to promote functional mobililty (01/12/2017    Status  Achieved      PT SHORT TERM GOAL #5   Title  Patient will increase passive right shoulder passive ER 40 percent per protocol     Status  Achieved        PT Long Term Goals - 06/28/18 1400      PT LONG TERM GOAL #1   Title  pt will be I with all HEP given as of last visit     Time  12    Period  Weeks    Status  On-going      PT LONG TERM GOAL #2   Title  pt will increase L knee flexion to >/=125 degrees and extension to >/=-2 degrees with </= 2/10 pain for functional and efficient gait pattern (02/06/2017)    Time  12    Period  Weeks  Status  On-going      PT LONG TERM GOAL #3   Title  pt will increase L knee strength to >/= 4/5 for prolonged walking/ standing activities to promote safety (02/06/2017)    Time  12    Period  Weeks    Status  On-going      PT LONG TERM GOAL #4   Title  pt will  be able to walk/ stand for >/= 30 minutes and navigate >/= 15 steps with >/=1 HHA for functional endurance and work related activities     Time  12    Period  Weeks    Status  On-going      PT LONG TERM GOAL #5   Title  He will increase FOTO score by >/= 42% limited to demonstrate improvement in functionat Discharge (02/06/2017)    Time  8    Period  Weeks    Status  On-going      Additional Long Term Goals   Additional Long Term Goals  Yes      PT LONG TERM GOAL #6   Title  Patient will demsotrate 4+/5 grossright shoulder strength in order to perfrom ADL's     Time  8    Period  Weeks    Status  New    Target Date  08/23/18      PT LONG TERM GOAL #7   Title  Patient will demsotrate WNL active ER and IR on the right wihtout pain in order to perform ADL's     Time  8    Period  Weeks    Status  New    Target Date  08/23/18            Plan - 08/20/18 1142    Clinical Impression Statement  Continues to demo improved AROM. Tightness in pec minor noted again today and reduced with manual therapy- feels that gentle extension stretch is helping. Added ball press into wall for stability over 90 deg    PT Treatment/Interventions  Manual techniques;Patient/family education;Therapeutic activities;Therapeutic exercise;Cryotherapy;Vasopneumatic Device;Passive range of motion;Taping;Functional mobility training;Gait training;Stair training;Neuromuscular re-education;Moist Heat    PT Next Visit Plan  follow protocol for RTC frepair from Dr Theda Sers.      PT Home Exercise Plan  pendulums, towel squeeze, PROM IR/ER in supine, resisted triceps extension yellow; dynamic stab ball on counter, bent over row, towel slide on table, wall walks, supine wand flx; AROM in mirror below 90, ball press into wall    Consulted and Agree with Plan of Care  Patient       Patient will benefit from skilled therapeutic intervention in order to improve the following deficits and impairments:  Pain, Decreased  activity tolerance, Decreased range of motion, Decreased strength, Difficulty walking, Increased edema, Impaired UE functional use, Decreased endurance  Visit Diagnosis: Stiffness of right shoulder, not elsewhere classified  Acute pain of right shoulder     Problem List Patient Active Problem List   Diagnosis Date Noted  . Right shoulder injury 06/15/2018  . S/P arthroscopy of right shoulder 06/15/2018  . Failed total knee arthroplasty ([place]) 04/21/2018  . OA (osteoarthritis) of knee 12/08/2016  . 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 Cervical facet joint syndrome 12/17/2015  . Chronic bilateral low back pain without sciatica 12/17/2015  . Primary osteoarthritis of left knee 12/17/2015    Lillien Petronio C. Tarah Buboltz PT, DPT 08/20/18 11:44 AM   [facility] [address]  [place], [place], [address] Phone: [phone]   Fax:  [phone]  Name: Daniel MICALE, MD MRN: [id number] Date of Birth: 1961-01-09

## 2018-08-23 ENCOUNTER — Ambulatory Visit: Payer: 59 | Admitting: Physical Therapy

## 2018-08-24 ENCOUNTER — Encounter: Payer: 59 | Admitting: Physical Therapy

## 2018-08-25 ENCOUNTER — Encounter: Payer: Self-pay | Admitting: Physical Therapy

## 2018-08-25 ENCOUNTER — Ambulatory Visit: Payer: 59 | Admitting: Physical Therapy

## 2018-08-25 DIAGNOSIS — M25611 Stiffness of right shoulder, not elsewhere classified: Secondary | ICD-10-CM

## 2018-08-25 DIAGNOSIS — M25511 Pain in right shoulder: Secondary | ICD-10-CM

## 2018-08-25 MED FILL — TADALAFIL 5 MG TABS: 5 | 30 days supply | Qty: 30 | Fill #6

## 2018-08-25 NOTE — Therapy (Signed)
[place], [place], [address] Phone: [phone]   Fax:  [phone]  Physical Therapy Treatment  Patient Details  Name: Daniel CORNETTE, [name] MRN: [id number] Date of Birth: 08-Jun-1961 Referring Provider (PT): Dr [name]    Encounter Date: 08/25/2018  PT End of Session - 08/25/18 1159    Visit Number  17    Number of Visits  27    Date for PT Re-Evaluation  08/23/18    Authorization Type   01/29/6221 recert for knee; 97/98/9211 for recert on shoulder     PT Start Time  1159    PT Stop Time  1253    PT Time Calculation (min)  54 min    Activity Tolerance  Patient tolerated treatment well    Behavior During Therapy  [facility] for tasks assessed/performed       Past Medical History:  Diagnosis Date  . Anxiety   . Aortic atherosclerosis ([place])   . BPH (benign prostatic hyperplasia)   . Chronic pain syndrome    neck and low back  . DDD (degenerative disc disease), cervical   . Diverticulosis   . DJD (degenerative joint disease)   . Ganglion cyst    12 mm , right posterior knee  . Hyperlipidemia   . Internal hemorrhoids   . Migraines   . OA (osteoarthritis)    knees, hands, right shoulder  . Positive QuantiFERON-TB Gold test   . Shoulder pain   . Tear of right rotator cuff   . Type 2 diabetes mellitus ([place])    last A1c 5.4 on 04-13-2018 in epic (followed by pcp)  . Wears glasses     Past Surgical History:  Procedure Laterality Date  . ANAL FISSURE REPAIR    . ARTHOSCOPIC ROTAOR CUFF REPAIR Right 06/15/2018   Procedure: RIGHT SHOULDER ARTHROSCOPY, DEBRIDEMENT, SUBACROMIAL DECOMPRESSION, DISTAL CLAVICLE RESECTION, ROTATOR CUFF REPAIR;  Surgeon: [name], [name];  Location: [facility];  Service: Orthopedics;  Laterality: Right;  . COLONOSCOPY  05-26-2017   dr Henrene Pastor  . Hip Resurface  2006  . KNEE ARTHROSCOPY W/ MENISCAL REPAIR Bilateral right 1993; left 2010  . MASS EXCISION Right  04/21/2018   Procedure: EXCISION RIGHT THIGH SOFT TISSUE MASS;  Surgeon: [name], [name];  [facility];  Service: Orthopedics;  Laterality: Right;  . SEPTOPLASTY    . SHOULDER ARTHROSCOPY WITH ROTATOR CUFF REPAIR Left   . SLAP Tear Repair  2008  . TONSILLECTOMY    . TOTAL KNEE ARTHROPLASTY Left 12/08/2016   Procedure: LEFT TOTAL KNEE ARTHROPLASTY;  Surgeon: [name], [name];  [facility];  Service: Orthopedics;  Laterality: Left;  . TOTAL KNEE ARTHROPLASTY WITH REVISION COMPONENTS Left 04/21/2018   Procedure: Left knee polyethylene exchange ;  Surgeon: [name], [name];  [facility];  Service: Orthopedics;  Laterality: Left;    There were no vitals filed for this visit.  Subjective Assessment - 08/25/18 1159    Subjective  It was bad yesterday but better today.     Patient Stated Goals  Retrun to normal                        [facility] Adult PT Treatment/Exercise - 08/25/18 0001      Exercises   Exercises  Shoulder      Shoulder Exercises: Seated   External Rotation Limitations  from table 1lb    Other Seated Exercises  from table  1lb, abd to ER      Shoulder Exercises: Sidelying   External Rotation  Right    External Rotation Weight (lbs)  3      Shoulder Exercises: Standing   Row Limitations  FM 1 plate    Other Standing Exercises  straight arm pull 1 plate FM    Other Standing Exercises  standing long axis deltoid      Shoulder Exercises: ROM/Strengthening   UBE (Upper Arm Bike)  retro 3 min L1      Shoulder Exercises: Stretch   Other Shoulder Stretches  seated ER on table      Vasopneumatic   Number Minutes Vasopneumatic   15 minutes    Vasopnuematic Location   Shoulder    Vasopneumatic Pressure  Low    Vasopneumatic Temperature   lowest      Manual Therapy   Passive ROM  GHJ stretching               PT Short Term Goals - 08/20/18 1106      PT SHORT TERM GOAL #1   Title  pt will be I with initial HEP given     Status   Achieved      PT SHORT TERM GOAL #2   Title  pt will be able to verbalize and demo techniques to prevent/ reduce edema/ pain via RICE and HEP)    Status  Achieved      PT SHORT TERM GOAL #3   Title  pt will increase L knee flexion to >/= 110 degrees and extension to >/= -5 degrees for functional progression     Status  Partially Met      PT SHORT TERM GOAL #4   Title  pt will be able to stand/ ambulate >/= 15 min with </= 5/10 pain with LRAD to promote functional mobililty (01/12/2017    Status  Achieved      PT SHORT TERM GOAL #5   Title  Patient will increase passive right shoulder passive ER 40 percent per protocol     Status  Achieved        PT Long Term Goals - 06/28/18 1400      PT LONG TERM GOAL #1   Title  pt will be I with all HEP given as of last visit     Time  12    Period  Weeks    Status  On-going      PT LONG TERM GOAL #2   Title  pt will increase L knee flexion to >/=125 degrees and extension to >/=-2 degrees with </= 2/10 pain for functional and efficient gait pattern (02/06/2017)    Time  12    Period  Weeks    Status  On-going      PT LONG TERM GOAL #3   Title  pt will increase L knee strength to >/= 4/5 for prolonged walking/ standing activities to promote safety (02/06/2017)    Time  12    Period  Weeks    Status  On-going      PT LONG TERM GOAL #4   Title  pt will be able to walk/ stand for >/= 30 minutes and navigate >/= 15 steps with >/=1 HHA for functional endurance and work related activities     Time  12    Period  Weeks    Status  On-going      PT LONG TERM GOAL #5   Title  He  will increase FOTO score by >/= 42% limited to demonstrate improvement in functionat Discharge (02/06/2017)    Time  8    Period  Weeks    Status  On-going      Additional Long Term Goals   Additional Long Term Goals  Yes      PT LONG TERM GOAL #6   Title  Patient will demsotrate 4+/5 grossright shoulder strength in order to perfrom ADL's     Time  8    Period   Weeks    Status  New    Target Date  08/23/18      PT LONG TERM GOAL #7   Title  Patient will demsotrate WNL active ER and IR on the right wihtout pain in order to perform ADL's     Time  8    Period  Weeks    Status  New    Target Date  08/23/18            Plan - 08/25/18 1248    Clinical Impression Statement  Began adding small weights to movement for gradual strengthening. Good control of AROM but fatigues quickly. Feels limited by tightness when reaching into ER but is [facility] passively    PT Treatment/Interventions  Manual techniques;Patient/family education;Therapeutic activities;Therapeutic exercise;Cryotherapy;Vasopneumatic Device;Passive range of motion;Taping;Functional mobility training;Gait training;Stair training;Neuromuscular re-education;Moist Heat    PT Next Visit Plan  follow protocol for RTC frepair from Dr Theda Sers.      PT Home Exercise Plan  pendulums, towel squeeze, PROM IR/ER in supine, resisted triceps extension yellow; dynamic stab ball on counter, bent over row, towel slide on table, wall walks, supine wand flx; AROM in mirror below 90, ball press into wall    Consulted and Agree with Plan of Care  Patient       Patient will benefit from skilled therapeutic intervention in order to improve the following deficits and impairments:  Pain, Decreased activity tolerance, Decreased range of motion, Decreased strength, Difficulty walking, Increased edema, Impaired UE functional use, Decreased endurance  Visit Diagnosis: Stiffness of right shoulder, not elsewhere classified  Acute pain of right shoulder     Problem List Patient Active Problem List   Diagnosis Date Noted  . Right shoulder injury 06/15/2018  . S/P arthroscopy of right shoulder 06/15/2018  . 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 OA (osteoarthritis) of knee 12/08/2016  . 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 Cervical facet joint  syndrome 12/17/2015  . Chronic bilateral low back pain without sciatica 12/17/2015  . Primary osteoarthritis of left knee 12/17/2015   Marinna Blane C. Brysun Eschmann PT, DPT 08/25/18 12:51 PM   [facility] [address] [place], [place], [address] Phone: [phone]   Fax:  [phone]  Name: Daniel WEY, [name] MRN: [id number] Date of Birth: 08-20-1961

## 2018-08-27 ENCOUNTER — Ambulatory Visit: Payer: 59 | Admitting: Physical Therapy

## 2018-08-27 ENCOUNTER — Encounter: Payer: Self-pay | Admitting: Physical Therapy

## 2018-08-27 DIAGNOSIS — M25611 Stiffness of right shoulder, not elsewhere classified: Secondary | ICD-10-CM

## 2018-08-27 DIAGNOSIS — M25511 Pain in right shoulder: Secondary | ICD-10-CM

## 2018-08-27 NOTE — Therapy (Signed)
[place], [place], [address] Phone: [phone]   Fax:  [phone]  Physical Therapy Treatment  Patient Details  Name: Daniel PLATZ, [name] MRN: [id number] Date of Birth: [birth year] Referring Provider (PT): Dr [name]    Encounter Date: 08/27/2018  PT End of Session - 08/27/18 1225    Visit Number  18    Number of Visits  27    Date for PT Re-Evaluation  08/23/18    Authorization Type   10/28/5788 recert for knee; 38/33/3832 for recert on shoulder     PT Start Time  1145    PT Stop Time  1237    PT Time Calculation (min)  52 min    Activity Tolerance  Patient tolerated treatment well    Behavior During Therapy  [facility] for tasks assessed/performed       Past Medical History:  Diagnosis Date  . Anxiety   . Aortic atherosclerosis ([place])   . BPH (benign prostatic hyperplasia)   . Chronic pain syndrome    neck and low back  . DDD (degenerative disc disease), cervical   . Diverticulosis   . DJD (degenerative joint disease)   . Ganglion cyst    12 mm , right posterior knee  . Hyperlipidemia   . Internal hemorrhoids   . Migraines   . OA (osteoarthritis)    knees, hands, right shoulder  . Positive QuantiFERON-TB Gold test   . Shoulder pain   . Tear of right rotator cuff   . Type 2 diabetes mellitus ([place])    last A1c 5.4 on 04-13-2018 in epic (followed by pcp)  . Wears glasses     Past Surgical History:  Procedure Laterality Date  . ANAL FISSURE REPAIR    . ARTHOSCOPIC ROTAOR CUFF REPAIR Right 06/15/2018   Procedure: RIGHT SHOULDER ARTHROSCOPY, DEBRIDEMENT, SUBACROMIAL DECOMPRESSION, DISTAL CLAVICLE RESECTION, ROTATOR CUFF REPAIR;  Surgeon: [name], [name];  Location: [place];  Service: Orthopedics;  Laterality: Right;  . COLONOSCOPY  05-26-2017   dr Henrene Pastor  . Hip Resurface  2006  . KNEE ARTHROSCOPY W/ MENISCAL REPAIR Bilateral right 1993; left 2010  . MASS EXCISION Right  04/21/2018   Procedure: EXCISION RIGHT THIGH SOFT TISSUE MASS;  Surgeon: [name], [name];  [facility];  Service: Orthopedics;  Laterality: Right;  . SEPTOPLASTY    . SHOULDER ARTHROSCOPY WITH ROTATOR CUFF REPAIR Left   . SLAP Tear Repair  2008  . TONSILLECTOMY    . TOTAL KNEE ARTHROPLASTY Left 12/08/2016   Procedure: LEFT TOTAL KNEE ARTHROPLASTY;  Surgeon: [name], [name];  [facility];  Service: Orthopedics;  Laterality: Left;  . TOTAL KNEE ARTHROPLASTY WITH REVISION COMPONENTS Left 04/21/2018   Procedure: Left knee polyethylene exchange ;  Surgeon: [name], [name];  [facility];  Service: Orthopedics;  Laterality: Left;    There were no vitals filed for this visit.  Subjective Assessment - 08/27/18 1150    Subjective  About a 5 today. Just sore. Any time I get above about 80 deg. Neck is stiff    Currently in Pain?  Yes    Pain Score  5          [facility] PT Assessment - 08/27/18 0001      AROM   Right/Left Shoulder  Left    Left Shoulder External Rotation  42 Degrees  [place] Adult PT Treatment/Exercise - 08/27/18 0001      Exercises   Exercises  Shoulder      Shoulder Exercises: Supine   Protraction  Both;15 reps    Flexion Limitations  AROM full arc      Shoulder Exercises: Prone   Flexion  10 reps    Extension  10 reps    Horizontal ABduction 1  10 reps;AROM      Shoulder Exercises: Sidelying   External Rotation  AROM;Right;20 reps      Shoulder Exercises: Standing   Other Standing Exercises  flexion roll up wall      Vasopneumatic   Number Minutes Vasopneumatic   15 minutes    Vasopnuematic Location   Shoulder    Vasopneumatic Pressure  Low    Vasopneumatic Temperature   lowest      Manual Therapy   Joint Mobilization  Rt lateral cervical glides    Soft tissue mobilization  Rt upper trap & cervical paraspinals; Rt pectoralis minot    Passive ROM  pec stretching               PT Short Term Goals -  08/20/18 1106      PT SHORT TERM GOAL #1   Title  pt will be I with initial HEP given     Status  Achieved      PT SHORT TERM GOAL #2   Title  pt will be able to verbalize and demo techniques to prevent/ reduce edema/ pain via RICE and HEP)    Status  Achieved      PT SHORT TERM GOAL #3   Title  pt will increase L knee flexion to >/= 110 degrees and extension to >/= -5 degrees for functional progression     Status  Partially Met      PT SHORT TERM GOAL #4   Title  pt will be able to stand/ ambulate >/= 15 min with </= 5/10 pain with LRAD to promote functional mobililty (01/12/2017    Status  Achieved      PT SHORT TERM GOAL #5   Title  Patient will increase passive right shoulder passive ER 40 percent per protocol     Status  Achieved        PT Long Term Goals - 06/28/18 1400      PT LONG TERM GOAL #1   Title  pt will be I with all HEP given as of last visit     Time  12    Period  Weeks    Status  On-going      PT LONG TERM GOAL #2   Title  pt will increase L knee flexion to >/=125 degrees and extension to >/=-2 degrees with </= 2/10 pain for functional and efficient gait pattern (02/06/2017)    Time  12    Period  Weeks    Status  On-going      PT LONG TERM GOAL #3   Title  pt will increase L knee strength to >/= 4/5 for prolonged walking/ standing activities to promote safety (02/06/2017)    Time  12    Period  Weeks    Status  On-going      PT LONG TERM GOAL #4   Title  pt will be able to walk/ stand for >/= 30 minutes and navigate >/= 15 steps with >/=1 HHA for functional endurance and work related activities     Time  12  Period  Weeks    Status  On-going      PT LONG TERM GOAL #5   Title  He will increase FOTO score by >/= 42% limited to demonstrate improvement in functionat Discharge (02/06/2017)    Time  8    Period  Weeks    Status  On-going      Additional Long Term Goals   Additional Long Term Goals  Yes      PT LONG TERM GOAL #6   Title  Patient  will demsotrate 4+/5 grossright shoulder strength in order to perfrom ADL's     Time  8    Period  Weeks    Status  New    Target Date  08/23/18      PT LONG TERM GOAL #7   Title  Patient will demsotrate WNL active ER and IR on the right wihtout pain in order to perform ADL's     Time  8    Period  Weeks    Status  New    Target Date  08/23/18            Plan - 08/27/18 1227    Clinical Impression Statement  Continues to tolerate progress well. Stiffness in neck prior to beginning treatment today was reduced with manual therapy. Cues for retraction in prone exercises with difficulty    PT Treatment/Interventions  Manual techniques;Patient/family education;Therapeutic activities;Therapeutic exercise;Cryotherapy;Vasopneumatic Device;Passive range of motion;Taping;Functional mobility training;Gait training;Stair training;Neuromuscular re-education;Moist Heat    PT Next Visit Plan  follow protocol for RTC frepair from Dr Theda Sers.    ERO & recert    PT Home Exercise Plan  pendulums, towel squeeze, PROM IR/ER in supine, resisted triceps extension yellow; dynamic stab ball on counter, bent over row, towel slide on table, wall walks, supine wand flx; AROM in mirror below 90, ball press into wall    Consulted and Agree with Plan of Care  Patient       Patient will benefit from skilled therapeutic intervention in order to improve the following deficits and impairments:  Pain, Decreased activity tolerance, Decreased range of motion, Decreased strength, Difficulty walking, Increased edema, Impaired UE functional use, Decreased endurance  Visit Diagnosis: Stiffness of right shoulder, not elsewhere classified  Acute pain of right shoulder     Problem List Patient Active Problem List   Diagnosis Date Noted  . Right shoulder injury 06/15/2018  . S/P arthroscopy of right shoulder 06/15/2018  . Failed total knee arthroplasty ([place]) 04/21/2018  . OA (osteoarthritis) of knee 12/08/2016  .  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 Cervical facet joint syndrome 12/17/2015  . Chronic bilateral low back pain without sciatica 12/17/2015  . Primary osteoarthritis of left knee 12/17/2015    Ruthetta Koopmann C. Joby Richart PT, DPT 08/27/18 12:31 PM   [place], [place], [address] Phone: [phone]   Fax:  [phone]  Name: Daniel DICKISON, [name] MRN: [id number] Date of Birth: 1961-04-02

## 2018-08-30 ENCOUNTER — Ambulatory Visit: Payer: 59 | Admitting: Physical Therapy

## 2018-08-31 MED FILL — AMOXICILLIN 500 MG CAPSULE: 500 | 1 days supply | Qty: 4 | Fill #0

## 2018-09-01 ENCOUNTER — Ambulatory Visit: Payer: 59 | Admitting: Physical Therapy

## 2018-09-01 ENCOUNTER — Encounter: Payer: Self-pay | Admitting: Physical Therapy

## 2018-09-01 DIAGNOSIS — M25611 Stiffness of right shoulder, not elsewhere classified: Secondary | ICD-10-CM | POA: Diagnosis not present

## 2018-09-01 DIAGNOSIS — M25511 Pain in right shoulder: Secondary | ICD-10-CM

## 2018-09-01 NOTE — Therapy (Signed)
[place], [place], [address] Phone: [phone]   Fax:  [phone]  Physical Therapy Treatment/ERO  Patient Details  Name: Daniel MATHEW, [name] MRN: [id number] Date of Birth: [birth year] Referring Provider (PT): Dr [name]    Encounter Date: 09/01/2018  PT End of Session - 09/01/18 1143    Visit Number  19    Number of Visits  31    Date for PT Re-Evaluation  10/15/17    Authorization Type   9/67/5916 recert for knee; 38/46/6599 for recert on shoulder     PT Start Time  1143    PT Stop Time  1243    PT Time Calculation (min)  60 min    Activity Tolerance  Patient tolerated treatment well    Behavior During Therapy  [facility] for tasks assessed/performed       Past Medical History:  Diagnosis Date  . Anxiety   . Aortic atherosclerosis ([place])   . BPH (benign prostatic hyperplasia)   . Chronic pain syndrome    neck and low back  . DDD (degenerative disc disease), cervical   . Diverticulosis   . DJD (degenerative joint disease)   . Ganglion cyst    12 mm , right posterior knee  . Hyperlipidemia   . Internal hemorrhoids   . Migraines   . OA (osteoarthritis)    knees, hands, right shoulder  . Positive QuantiFERON-TB Gold test   . Shoulder pain   . Tear of right rotator cuff   . Type 2 diabetes mellitus ([place])    last A1c 5.4 on 04-13-2018 in epic (followed by pcp)  . Wears glasses     Past Surgical History:  Procedure Laterality Date  . ANAL FISSURE REPAIR    . ARTHOSCOPIC ROTAOR CUFF REPAIR Right 06/15/2018   Procedure: RIGHT SHOULDER ARTHROSCOPY, DEBRIDEMENT, SUBACROMIAL DECOMPRESSION, DISTAL CLAVICLE RESECTION, ROTATOR CUFF REPAIR;  Surgeon: [name], [name];  Location: [place];  Service: Orthopedics;  Laterality: Right;  . COLONOSCOPY  05-26-2017   dr Henrene Pastor  . Hip Resurface  2006  . KNEE ARTHROSCOPY W/ MENISCAL REPAIR Bilateral right 1993; left 2010  . MASS EXCISION  Right 04/21/2018   Procedure: EXCISION RIGHT THIGH SOFT TISSUE MASS;  Surgeon: [name], [name];  [facility];  Service: Orthopedics;  Laterality: Right;  . SEPTOPLASTY    . SHOULDER ARTHROSCOPY WITH ROTATOR CUFF REPAIR Left   . SLAP Tear Repair  2008  . TONSILLECTOMY    . TOTAL KNEE ARTHROPLASTY Left 12/08/2016   Procedure: LEFT TOTAL KNEE ARTHROPLASTY;  Surgeon: [name], [name];  [facility];  Service: Orthopedics;  Laterality: Left;  . TOTAL KNEE ARTHROPLASTY WITH REVISION COMPONENTS Left 04/21/2018   Procedure: Left knee polyethylene exchange ;  Surgeon: [name], [name];  [facility];  Service: Orthopedics;  Laterality: Left;    There were no vitals filed for this visit.  Subjective Assessment - 09/01/18 1143    Subjective  my neck has been hurting more than shoulder- upper trap & C2/3. some soreness in labrum and on anterior aspect.     Patient Stated Goals  Retrun to normal     Currently in Pain?  Yes    Pain Score  4     Pain Location  Shoulder   neck/shoulder   Pain Orientation  Right    Pain Descriptors / Indicators  Aching;Sore;Tightness    Aggravating Factors   use of arm  Pain Relieving Factors  heat         [facility] PT Assessment - 09/01/18 0001      Assessment   Medical Diagnosis  Right RTC repair     Referring Provider (PT)  Dr [name]     Onset Date/Surgical Date  06/15/18      ROM / Strength   AROM / PROM / Strength  Strength      AROM   Right/Left Shoulder  Right    Right Shoulder Flexion  126 Degrees    Right Shoulder ABduction  114 Degrees    Right Shoulder Internal Rotation  --   Rt SIJ, shy of midline   Left Shoulder External Rotation  48 Degrees   C4 at midline     Strength   Overall Strength Comments  gross 4-/5 in avail range- unable to actively achieve full ROM                   [facility] Adult PT Treatment/Exercise - 09/01/18 0001      Shoulder Exercises: Standing   Extension Limitations  FM 1 plate     Other Standing Exercises  small circle protracting into ball on wall    Other Standing Exercises  wall push ups- narrow & wide      Shoulder Exercises: ROM/Strengthening   UBE (Upper Arm Bike)  L1 2/2      Vasopneumatic   Number Minutes Vasopneumatic   15 minutes    Vasopnuematic Location   Shoulder    Vasopneumatic Pressure  Low    Vasopneumatic Temperature   lowest      Manual Therapy   Manual therapy comments  cervical traction    Soft tissue mobilization  cervical paraspinals, suboccipitals, upper trap, pec minor    Passive ROM  Rt pec stretching             PT Education - 09/01/18 1354    Education Details  goals, POC, anatomy of condition, exercise form/rationale    Person(s) Educated  Patient    Methods  Explanation;Demonstration;Tactile cues;Verbal cues    Comprehension  Verbalized understanding;Returned demonstration;Verbal cues required;Tactile cues required;Need further instruction       PT Short Term Goals - 08/20/18 1106      PT SHORT TERM GOAL #1   Title  pt will be I with initial HEP given     Status  Achieved      PT SHORT TERM GOAL #2   Title  pt will be able to verbalize and demo techniques to prevent/ reduce edema/ pain via RICE and HEP)    Status  Achieved      PT SHORT TERM GOAL #3   Title  pt will increase L knee flexion to >/= 110 degrees and extension to >/= -5 degrees for functional progression     Status  Partially Met      PT SHORT TERM GOAL #4   Title  pt will be able to stand/ ambulate >/= 15 min with </= 5/10 pain with LRAD to promote functional mobililty (01/12/2017    Status  Achieved      PT SHORT TERM GOAL #5   Title  Patient will increase passive right shoulder passive ER 40 percent per protocol     Status  Achieved        PT Long Term Goals - 09/01/18 1147      PT LONG TERM GOAL #1   Status  --  PT LONG TERM GOAL #5   Title  He will increase FOTO score by >/= 42% limited to demonstrate improvement in functionat  Discharge (02/06/2017)    Status  Unable to assess      PT LONG TERM GOAL #6   Title  Patient will demsotrate 4+/5 grossright shoulder strength in order to perfrom ADL's     Status  On-going      PT LONG TERM GOAL #7   Title  Patient will demsotrate WNL active ER and IR on the right wihtout pain in order to perform ADL's     Status  On-going            Plan - 09/01/18 1348    Clinical Impression Statement  Continues to demonstrate improvements in ROM and strength but is beginning to feet tightness in upper trap and cervical region from compensation patterns. Will continue to challenge low intensity, isotonic strength and improve AROM.     PT Treatment/Interventions  Manual techniques;Patient/family education;Therapeutic activities;Therapeutic exercise;Cryotherapy;Vasopneumatic Device;Passive range of motion;Taping;Functional mobility training;Gait training;Stair training;Neuromuscular re-education;Moist Heat    PT Next Visit Plan  follow protocol for RTC frepair from Dr Theda Sers.        PT Home Exercise Plan  pendulums, towel squeeze, PROM IR/ER in supine, resisted triceps extension yellow; dynamic stab ball on counter, bent over row, towel slide on table, wall walks, supine wand flx; AROM in mirror below 90, ball press into wall    Consulted and Agree with Plan of Care  Patient       Patient will benefit from skilled therapeutic intervention in order to improve the following deficits and impairments:  Pain, Decreased activity tolerance, Decreased range of motion, Decreased strength, Difficulty walking, Increased edema, Impaired UE functional use, Decreased endurance  Visit Diagnosis: Stiffness of right shoulder, not elsewhere classified - Plan: PT plan of care cert/re-cert  Acute pain of right shoulder - Plan: PT plan of care cert/re-cert     Problem List Patient Active Problem List   Diagnosis Date Noted  . Right shoulder injury 06/15/2018  . S/P arthroscopy of right shoulder  06/15/2018  . 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 OA (osteoarthritis) of knee 12/08/2016  . 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 Cervical facet joint syndrome 12/17/2015  . Chronic bilateral low back pain without sciatica 12/17/2015  . Primary osteoarthritis of left knee 12/17/2015    Yetunde Leis C. Hlee Fringer PT, DPT 09/01/18 1:56 PM   [facility] [address] [place], [place], [address] Phone: [phone]   Fax:  [phone]  Name: Daniel DIRR, [name] MRN: [id number] Date of Birth: 05/25/1961

## 2018-09-03 ENCOUNTER — Ambulatory Visit: Payer: 59 | Admitting: Physical Therapy

## 2018-09-03 ENCOUNTER — Encounter: Payer: Self-pay | Admitting: Physical Therapy

## 2018-09-03 DIAGNOSIS — M25611 Stiffness of right shoulder, not elsewhere classified: Secondary | ICD-10-CM

## 2018-09-03 DIAGNOSIS — M25511 Pain in right shoulder: Secondary | ICD-10-CM | POA: Diagnosis not present

## 2018-09-03 NOTE — Therapy (Addendum)
[place], [place], [address] Phone: [phone]   Fax:  [phone]  Physical Therapy Treatment Progress Note Reporting Period 08/23/2018 to 09/03/2018   See note below for Objective Data and Assessment of Progress/Goals.      Patient Details  Name: Daniel BAKOS, [name] MRN: [id number] Date of Birth: 11-12-1960 Referring Provider (PT): Dr [name]    Encounter Date: 09/03/2018  PT End of Session - 09/03/18 1145    Visit Number  20    Number of Visits  31    Date for PT Re-Evaluation  10/15/17    Authorization Type   1/95/0932 recert for knee; 67/09/4579 for recert on shoulder     PT Start Time  1142    PT Stop Time  1242    PT Time Calculation (min)  60 min    Activity Tolerance  Patient tolerated treatment well    Behavior During Therapy  [facility] for tasks assessed/performed       Past Medical History:  Diagnosis Date  . Anxiety   . Aortic atherosclerosis ([place])   . BPH (benign prostatic hyperplasia)   . Chronic pain syndrome    neck and low back  . DDD (degenerative disc disease), cervical   . Diverticulosis   . DJD (degenerative joint disease)   . Ganglion cyst    12 mm , right posterior knee  . Hyperlipidemia   . Internal hemorrhoids   . Migraines   . OA (osteoarthritis)    knees, hands, right shoulder  . Positive QuantiFERON-TB Gold test   . Shoulder pain   . Tear of right rotator cuff   . Type 2 diabetes mellitus ([place])    last A1c 5.4 on 04-13-2018 in epic (followed by pcp)  . Wears glasses     Past Surgical History:  Procedure Laterality Date  . ANAL FISSURE REPAIR    . ARTHOSCOPIC ROTAOR CUFF REPAIR Right 06/15/2018   Procedure: RIGHT SHOULDER ARTHROSCOPY, DEBRIDEMENT, SUBACROMIAL DECOMPRESSION, DISTAL CLAVICLE RESECTION, ROTATOR CUFF REPAIR;  Surgeon: [name], [name];  Location: [place];  Service: Orthopedics;  Laterality: Right;  . COLONOSCOPY   05-26-2017   dr Henrene Pastor  . Hip Resurface  2006  . KNEE ARTHROSCOPY W/ MENISCAL REPAIR Bilateral right 1993; left 2010  . MASS EXCISION Right 04/21/2018   Procedure: EXCISION RIGHT THIGH SOFT TISSUE MASS;  Surgeon: [name], [name];  [facility];  Service: Orthopedics;  Laterality: Right;  . SEPTOPLASTY    . SHOULDER ARTHROSCOPY WITH ROTATOR CUFF REPAIR Left   . SLAP Tear Repair  2008  . TONSILLECTOMY    . TOTAL KNEE ARTHROPLASTY Left 12/08/2016   Procedure: LEFT TOTAL KNEE ARTHROPLASTY;  Surgeon: [name], [name];  [facility];  Service: Orthopedics;  Laterality: Left;  . TOTAL KNEE ARTHROPLASTY WITH REVISION COMPONENTS Left 04/21/2018   Procedure: Left knee polyethylene exchange ;  Surgeon: [name], [name];  [facility];  Service: Orthopedics;  Laterality: Left;    There were no vitals filed for this visit.                    [place] Adult PT Treatment/Exercise - 09/03/18 0001      Exercises   Exercises  Shoulder      Shoulder Exercises: Seated   External Rotation Limitations  x20 red tband    Flexion Weight (lbs)  1    Flexion Limitations  to tolerance, cues for form  Shoulder Exercises: Standing   Internal Rotation Limitations  behind back with wand    Extension  10 reps    Extension Limitations  holding wand    Diagonals Limitations  FM 3lb D1 ext      Shoulder Exercises: Stretch   Table Stretch -Flexion Limitations  walk back with hands on counter    Other Shoulder Stretches  low W door stretch      Shoulder Exercises: Body Blade   External Rotation Limitations  2x30 at 45 ER      Vasopneumatic   Number Minutes Vasopneumatic   15 minutes    Vasopnuematic Location   Shoulder    Vasopneumatic Pressure  Low    Vasopneumatic Temperature   lowest      Manual Therapy   Joint Mobilization  GHJ at end range flx, abd & ER    Soft tissue mobilization  IASTM Rt upper trap in seated    Passive ROM  GHJ to tolerance                PT Short Term Goals - 08/20/18 1106      PT SHORT TERM GOAL #1   Title  pt will be I with initial HEP given     Status  Achieved      PT SHORT TERM GOAL #2   Title  pt will be able to verbalize and demo techniques to prevent/ reduce edema/ pain via RICE and HEP)    Status  Achieved      PT SHORT TERM GOAL #3   Title  pt will increase L knee flexion to >/= 110 degrees and extension to >/= -5 degrees for functional progression     Status  Partially Met      PT SHORT TERM GOAL #4   Title  pt will be able to stand/ ambulate >/= 15 min with </= 5/10 pain with LRAD to promote functional mobililty (01/12/2017    Status  Achieved      PT SHORT TERM GOAL #5   Title  Patient will increase passive right shoulder passive ER 40 percent per protocol     Status  Achieved        PT Long Term Goals - 09/01/18 1147      PT LONG TERM GOAL #1   Status  --      PT LONG TERM GOAL #5   Title  He will increase FOTO score by >/= 42% limited to demonstrate improvement in functionat Discharge (02/06/2017)    Status  Unable to assess      PT LONG TERM GOAL #6   Title  Patient will demsotrate 4+/5 grossright shoulder strength in order to perfrom ADL's     Status  On-going      PT LONG TERM GOAL #7   Title  Patient will demsotrate WNL active ER and IR on the right wihtout pain in order to perform ADL's     Status  On-going            Plan - 09/03/18 1228    Clinical Impression Statement  Capsular tightness decreased with mobilization. Pec tightness continued and door stretch increased to low W.     PT Treatment/Interventions  Manual techniques;Patient/family education;Therapeutic activities;Therapeutic exercise;Cryotherapy;Vasopneumatic Device;Passive range of motion;Taping;Functional mobility training;Gait training;Stair training;Neuromuscular re-education;Moist Heat    PT Next Visit Plan  follow protocol for RTC frepair from Dr Theda Sers.      focusing on AROM- full range,  stretching anterior  with periscapular control.     PT Home Exercise Plan  pendulums, towel squeeze, PROM IR/ER in supine, resisted triceps extension yellow; dynamic stab ball on counter, bent over row, towel slide on table, wall walks, supine wand flx; AROM in mirror below 90, ball press into wall    Consulted and Agree with Plan of Care  Patient       Patient will benefit from skilled therapeutic intervention in order to improve the following deficits and impairments:  Pain, Decreased activity tolerance, Decreased range of motion, Decreased strength, Difficulty walking, Increased edema, Impaired UE functional use, Decreased endurance  Visit Diagnosis: Stiffness of right shoulder, not elsewhere classified  Acute pain of right shoulder     Problem List Patient Active Problem List   Diagnosis Date Noted  . Right shoulder injury 06/15/2018  . S/P arthroscopy of right shoulder 06/15/2018  . 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 OA (osteoarthritis) of knee 12/08/2016  . 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 Cervical facet joint syndrome 12/17/2015  . Chronic bilateral low back pain without sciatica 12/17/2015  . Primary osteoarthritis of left knee 12/17/2015    Sadeel Fiddler C. Gala Padovano PT, DPT 09/03/18 12:37 PM   [place] [facility] [address] [place], [place], [address] Phone: [phone]   Fax:  [phone]  Name: Daniel HARDGROVE, [name] MRN: [id number] Date of Birth: 26-Sep-1961

## 2018-09-06 MED FILL — diazePAM 5 MG TABS: 5 | 30 days supply | Qty: 60 | Fill #1

## 2018-09-06 MED FILL — ISONIAZID 300 MG TABLET: 300 | 30 days supply | Qty: 30 | Fill #1

## 2018-09-08 ENCOUNTER — Ambulatory Visit: Payer: 59 | Admitting: Physical Therapy

## 2018-09-08 ENCOUNTER — Encounter: Payer: Self-pay | Admitting: Physical Therapy

## 2018-09-08 DIAGNOSIS — M25511 Pain in right shoulder: Secondary | ICD-10-CM

## 2018-09-08 DIAGNOSIS — M25611 Stiffness of right shoulder, not elsewhere classified: Secondary | ICD-10-CM

## 2018-09-08 NOTE — Therapy (Signed)
[place], [place], [address] Phone: [phone]   Fax:  [phone]  Physical Therapy Treatment  Patient Details  Name: Daniel BRUECKNER, MD MRN: [id number] Date of Birth: [birth year] Referring Provider (PT): Dr [name]    Encounter Date: 09/08/2018  PT End of Session - 09/08/18 1351    Visit Number  21    Number of Visits  31    Date for PT Re-Evaluation  10/15/17    Authorization Type   1/59/4585 recert for knee; 92/92/4462 for recert on shoulder     PT Start Time  0147    PT Stop Time  0245    PT Time Calculation (min)  58 min       Past Medical History:  Diagnosis Date  . Anxiety   . Aortic atherosclerosis ([place])   . BPH (benign prostatic hyperplasia)   . Chronic pain syndrome    neck and low back  . DDD (degenerative disc disease), cervical   . Diverticulosis   . DJD (degenerative joint disease)   . Ganglion cyst    12 mm , right posterior knee  . Hyperlipidemia   . Internal hemorrhoids   . Migraines   . OA (osteoarthritis)    knees, hands, right shoulder  . Positive QuantiFERON-TB Gold test   . Shoulder pain   . Tear of right rotator cuff   . Type 2 diabetes mellitus ([place])    last A1c 5.4 on 04-13-2018 in epic (followed by pcp)  . Wears glasses     Past Surgical History:  Procedure Laterality Date  . ANAL FISSURE REPAIR    . ARTHOSCOPIC ROTAOR CUFF REPAIR Right 06/15/2018   Procedure: RIGHT SHOULDER ARTHROSCOPY, DEBRIDEMENT, SUBACROMIAL DECOMPRESSION, DISTAL CLAVICLE RESECTION, ROTATOR CUFF REPAIR;  Surgeon: [name], MD;  Location: [facility];  Service: Orthopedics;  Laterality: Right;  . COLONOSCOPY  05-26-2017   dr Henrene Pastor  . Hip Resurface  2006  . KNEE ARTHROSCOPY W/ MENISCAL REPAIR Bilateral right 1993; left 2010  . MASS EXCISION Right 04/21/2018   Procedure: EXCISION RIGHT THIGH SOFT TISSUE MASS;  Surgeon: [name], MD;  [facility];   Service: Orthopedics;  Laterality: Right;  . SEPTOPLASTY    . SHOULDER ARTHROSCOPY WITH ROTATOR CUFF REPAIR Left   . SLAP Tear Repair  2008  . TONSILLECTOMY    . 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 TOTAL KNEE ARTHROPLASTY WITH REVISION COMPONENTS Left 04/21/2018   Procedure: Left knee polyethylene exchange ;  Surgeon: [name], MD;  [facility];  Service: Orthopedics;  Laterality: Left;    There were no vitals filed for this visit.  Subjective Assessment - 09/08/18 1349    Subjective  Had massage yesterday and she said my subscap was tight. She worked on my upper trap and pec     Currently in Pain?  Yes    Pain Score  4     Pain Location  Shoulder    Pain Orientation  Right;Anterior;Upper    Pain Descriptors / Indicators  Tightness   stiffness   Aggravating Factors   external rotation, abduction, reach behind head, reach behind back     Pain Relieving Factors  pain meds, heat  [facility] Adult PT Treatment/Exercise - 09/08/18 0001      Exercises   Exercises  Shoulder      Shoulder Exercises: Seated   External Rotation Limitations  x20 red tband   standing     Shoulder Exercises: Standing   Internal Rotation Limitations  behind back with wand    Extension  10 reps    Extension Limitations  holding wand    Row  20 reps    Theraband Level (Shoulder Row)  Level 3 (Green)    Other Standing Exercises  AAROM standing using UE ranger for flexion  on wall, also as cane exercise for flexion and abduction     Other Standing Exercises  green band extensions x 20       Shoulder Exercises: Pulleys   Flexion  2 minutes      Shoulder Exercises: ROM/Strengthening   Lat Pull  --   20lbs   Lat Pull Limitations  x 10     Other ROM/Strengthening Exercises  Seated cybex row 20lbs low and mid x 10 each       Shoulder Exercises: Stretch   Other Shoulder Stretches  low W door stretch      Vasopneumatic   Number Minutes Vasopneumatic   15 minutes    Vasopnuematic Location   Shoulder    Vasopneumatic Pressure  Low    Vasopneumatic Temperature   lowest      Manual Therapy   Joint Mobilization  --    Passive ROM  GHJ to tolerance               PT Short Term Goals - 08/20/18 1106      PT SHORT TERM GOAL #1   Title  pt will be I with initial HEP given     Status  Achieved      PT SHORT TERM GOAL #2   Title  pt will be able to verbalize and demo techniques to prevent/ reduce edema/ pain via RICE and HEP)    Status  Achieved      PT SHORT TERM GOAL #3   Title  pt will increase L knee flexion to >/= 110 degrees and extension to >/= -5 degrees for functional progression     Status  Partially Met      PT SHORT TERM GOAL #4   Title  pt will be able to stand/ ambulate >/= 15 min with </= 5/10 pain with LRAD to promote functional mobililty (01/12/2017    Status  Achieved      PT SHORT TERM GOAL #5   Title  Patient will increase passive right shoulder passive ER 40 percent per protocol     Status  Achieved        PT Long Term Goals - 09/01/18 1147      PT LONG TERM GOAL #1   Status  --      PT LONG TERM GOAL #5   Title  He will increase FOTO score by >/= 42% limited to demonstrate improvement in functionat Discharge (02/06/2017)    Status  Unable to assess      PT LONG TERM GOAL #6   Title  Patient will demsotrate 4+/5 grossright shoulder strength in order to perfrom ADL's     Status  On-going      PT LONG TERM GOAL #7   Title  Patient will demsotrate WNL active ER and IR on the right wihtout pain in order to perform ADL's  Status  On-going            Plan - 09/08/18 1435    Clinical Impression Statement  pt reports 6-7/10 pain prior to meds and 4/10 at start of treatment. Continued with AAROM, scapular strengthening and RTC strengthening as tolerated. Used seated  row machine and standing lat pull down with focus on scapular control.     PT Next Visit Plan  follow protocol for RTC frepair from Dr Theda Sers.      focusing on AROM- full range, stretching anterior with periscapular control.     PT Home Exercise Plan  pendulums, towel squeeze, PROM IR/ER in supine, resisted triceps extension yellow; dynamic stab ball on counter, bent over row, towel slide on table, wall walks, supine wand flx; AROM in mirror below 90, ball press into wall    Consulted and Agree with Plan of Care  Patient       Patient will benefit from skilled therapeutic intervention in order to improve the following deficits and impairments:  Pain, Decreased activity tolerance, Decreased range of motion, Decreased strength, Difficulty walking, Increased edema, Impaired UE functional use, Decreased endurance  Visit Diagnosis: Stiffness of right shoulder, not elsewhere classified  Acute pain of right shoulder     Problem List Patient Active Problem List   Diagnosis Date Noted  . Right shoulder injury 06/15/2018  . S/P arthroscopy of right shoulder 06/15/2018  . Failed total knee arthroplasty ([place]) 04/21/2018  . OA (osteoarthritis) of knee 12/08/2016  . 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 Cervical facet joint syndrome 12/17/2015  . Chronic bilateral low back pain without sciatica 12/17/2015  . Primary osteoarthritis of left knee 12/17/2015    Dorene Ar, PTA 09/08/2018, 2:46 PM  [facility] [address] [place], [place], [address] Phone: [phone]   Fax:  [phone]  Name: CLAIRE BRIDGE, MD MRN: [id number] Date of Birth: [birth year]

## 2018-09-15 ENCOUNTER — Ambulatory Visit: Payer: 59 | Attending: Student | Admitting: Physical Therapy

## 2018-09-15 ENCOUNTER — Encounter: Payer: Self-pay | Admitting: Physical Therapy

## 2018-09-15 DIAGNOSIS — M25511 Pain in right shoulder: Secondary | ICD-10-CM | POA: Insufficient documentation

## 2018-09-15 DIAGNOSIS — M25611 Stiffness of right shoulder, not elsewhere classified: Secondary | ICD-10-CM | POA: Diagnosis not present

## 2018-09-15 DIAGNOSIS — E7849 Other hyperlipidemia: Secondary | ICD-10-CM | POA: Diagnosis not present

## 2018-09-15 NOTE — Therapy (Signed)
[place], [place], [address] Phone: [phone]   Fax:  [phone]  Physical Therapy Treatment  Patient Details  Name: Daniel SCHOENFELD, [name] MRN: [id number] Date of Birth: Oct 03, 1961 Referring Provider (PT): Dr [name]    Encounter Date: 09/15/2018  PT End of Session - 09/15/18 1236    Visit Number  22    Number of Visits  31    Date for PT Re-Evaluation  10/15/17    Authorization Type   3/79/0240 recert for knee; 97/35/3299 for recert on shoulder     PT Start Time  1150    PT Stop Time  1249    PT Time Calculation (min)  59 min    Activity Tolerance  Patient tolerated treatment well    Behavior During Therapy  [facility] for tasks assessed/performed       Past Medical History:  Diagnosis Date  . Anxiety   . Aortic atherosclerosis ([place])   . BPH (benign prostatic hyperplasia)   . Chronic pain syndrome    neck and low back  . DDD (degenerative disc disease), cervical   . Diverticulosis   . DJD (degenerative joint disease)   . Ganglion cyst    12 mm , right posterior knee  . Hyperlipidemia   . Internal hemorrhoids   . Migraines   . OA (osteoarthritis)    knees, hands, right shoulder  . Positive QuantiFERON-TB Gold test   . Shoulder pain   . Tear of right rotator cuff   . Type 2 diabetes mellitus ([place])    last A1c 5.4 on 04-13-2018 in epic (followed by pcp)  . Wears glasses     Past Surgical History:  Procedure Laterality Date  . ANAL FISSURE REPAIR    . ARTHOSCOPIC ROTAOR CUFF REPAIR Right 06/15/2018   Procedure: RIGHT SHOULDER ARTHROSCOPY, DEBRIDEMENT, SUBACROMIAL DECOMPRESSION, DISTAL CLAVICLE RESECTION, ROTATOR CUFF REPAIR;  Surgeon: [name], [name];  Location: [facility];  Service: Orthopedics;  Laterality: Right;  . COLONOSCOPY  05-26-2017   dr Henrene Pastor  . Hip Resurface  2006  . KNEE ARTHROSCOPY W/ MENISCAL REPAIR Bilateral right 1993; left 2010  . MASS EXCISION Right  04/21/2018   Procedure: EXCISION RIGHT THIGH SOFT TISSUE MASS;  Surgeon: [name], [name];  [facility];  Service: Orthopedics;  Laterality: Right;  . SEPTOPLASTY    . SHOULDER ARTHROSCOPY WITH ROTATOR CUFF REPAIR Left   . SLAP Tear Repair  2008  . TONSILLECTOMY    . TOTAL KNEE ARTHROPLASTY Left 12/08/2016   Procedure: LEFT TOTAL KNEE ARTHROPLASTY;  Surgeon: [name], [name];  [facility];  Service: Orthopedics;  Laterality: Left;  . TOTAL KNEE ARTHROPLASTY WITH REVISION COMPONENTS Left 04/21/2018   Procedure: Left knee polyethylene exchange ;  Surgeon: [name], [name];  [facility];  Service: Orthopedics;  Laterality: Left;    There were no vitals filed for this visit.  Subjective Assessment - 09/15/18 1151    Subjective  Was feeling pretty good on Wed after massage and did exercises. Today has significant tightness in Rt upper trap and paraspinals. Still having problems with wrist- feels sprained.     Patient Stated Goals  Retrun to normal                        [facility] Adult PT Treatment/Exercise - 09/15/18 0001      Exercises   Exercises  Shoulder  Shoulder Exercises: Standing   Other Standing Exercises  ball roll up wall to liftoff    Other Standing Exercises  abd to 90 1lb      Shoulder Exercises: ROM/Strengthening   UBE (Upper Arm Bike)  L1 2/2    Other ROM/Strengthening Exercises  cervical rotation SNAG & ext SNAG      Vasopneumatic   Number Minutes Vasopneumatic   15 minutes    Vasopnuematic Location   Shoulder    Vasopneumatic Pressure  Low    Vasopneumatic Temperature   lowest      Manual Therapy   Joint Mobilization  C5-T3 PA & lat in prone; supine lateral glides C6    Soft tissue mobilization  Rt upper trap/cervical paraspinals               PT Short Term Goals - 08/20/18 1106      PT SHORT TERM GOAL #1   Title  pt will be I with initial HEP given     Status  Achieved      PT SHORT TERM GOAL #2   Title  pt  will be able to verbalize and demo techniques to prevent/ reduce edema/ pain via RICE and HEP)    Status  Achieved      PT SHORT TERM GOAL #3   Title  pt will increase L knee flexion to >/= 110 degrees and extension to >/= -5 degrees for functional progression     Status  Partially Met      PT SHORT TERM GOAL #4   Title  pt will be able to stand/ ambulate >/= 15 min with </= 5/10 pain with LRAD to promote functional mobililty (01/12/2017    Status  Achieved      PT SHORT TERM GOAL #5   Title  Patient will increase passive right shoulder passive ER 40 percent per protocol     Status  Achieved        PT Long Term Goals - 09/01/18 1147      PT LONG TERM GOAL #1   Status  --      PT LONG TERM GOAL #5   Title  He will increase FOTO score by >/= 42% limited to demonstrate improvement in functionat Discharge (02/06/2017)    Status  Unable to assess      PT LONG TERM GOAL #6   Title  Patient will demsotrate 4+/5 grossright shoulder strength in order to perfrom ADL's     Status  On-going      PT LONG TERM GOAL #7   Title  Patient will demsotrate WNL active ER and IR on the right wihtout pain in order to perform ADL's     Status  On-going            Plan - 09/15/18 1241    Clinical Impression Statement  Significant stiffness noted in lower cervical/upper throacic region with recreated tightness at C6. Utilized SNAGS for mobilization at home. This did not affect sensation/function at wrist/hand which is still bothersome to pt.     PT Treatment/Interventions  Manual techniques;Patient/family education;Therapeutic activities;Therapeutic exercise;Cryotherapy;Vasopneumatic Device;Passive range of motion;Taping;Functional mobility training;Gait training;Stair training;Neuromuscular re-education;Moist Heat    PT Next Visit Plan  follow protocol for RTC frepair from Dr Theda Sers.      focusing on AROM- full range, stretching anterior with periscapular control. check spinal mobility & GHJ  mobility at end range    PT Home Exercise Plan  pendulums, towel squeeze, PROM IR/ER  in supine, resisted triceps extension yellow; dynamic stab ball on counter, bent over row, towel slide on table, wall walks, supine wand flx; AROM in mirror below 90, ball press into wall    Consulted and Agree with Plan of Care  Patient       Patient will benefit from skilled therapeutic intervention in order to improve the following deficits and impairments:  Pain, Decreased activity tolerance, Decreased range of motion, Decreased strength, Difficulty walking, Increased edema, Impaired UE functional use, Decreased endurance  Visit Diagnosis: Stiffness of right shoulder, not elsewhere classified  Acute pain of right shoulder     Problem List Patient Active Problem List   Diagnosis Date Noted  . Right shoulder injury 06/15/2018  . S/P arthroscopy of right shoulder 06/15/2018  . Failed total knee arthroplasty ([place]) 04/21/2018  . OA (osteoarthritis) of knee 12/08/2016  . 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 Cervical facet joint syndrome 12/17/2015  . Chronic bilateral low back pain without sciatica 12/17/2015  . Primary osteoarthritis of left knee 12/17/2015    Benjamin Casanas C. [place]ed[place] PT, DPT 09/15/18 12:53 PM   [place], [place], [address] Phone: [phone]   Fax:  [phone]  Name: Daniel APUZZO, [name] MRN: [id number] Date of Birth: [birth year]

## 2018-09-17 ENCOUNTER — Encounter: Payer: Self-pay | Admitting: Physical Therapy

## 2018-09-17 ENCOUNTER — Ambulatory Visit: Payer: 59 | Admitting: Physical Therapy

## 2018-09-17 DIAGNOSIS — M25611 Stiffness of right shoulder, not elsewhere classified: Secondary | ICD-10-CM | POA: Diagnosis not present

## 2018-09-17 DIAGNOSIS — M25511 Pain in right shoulder: Secondary | ICD-10-CM

## 2018-09-17 NOTE — Therapy (Signed)
[place], [place], [address] Phone: [phone]   Fax:  [phone]  Physical Therapy Treatment  Patient Details  Name: Daniel MCCADDEN, [name] MRN: [id number] Date of Birth: Aug 23, 1961 Referring Provider (PT): Dr [name]    Encounter Date: 09/17/2018  PT End of Session - 09/17/18 1101    Visit Number  23    Number of Visits  31    Date for PT Re-Evaluation  10/15/17    Authorization Type   05/01/7217 recert for knee; 28/83/3744 for recert on shoulder     PT Start Time  1100    PT Stop Time  1153    PT Time Calculation (min)  53 min    Activity Tolerance  Patient tolerated treatment well    Behavior During Therapy  [facility] for tasks assessed/performed       Past Medical History:  Diagnosis Date  . Anxiety   . Aortic atherosclerosis ([place])   . BPH (benign prostatic hyperplasia)   . Chronic pain syndrome    neck and low back  . DDD (degenerative disc disease), cervical   . Diverticulosis   . DJD (degenerative joint disease)   . Ganglion cyst    12 mm , right posterior knee  . Hyperlipidemia   . Internal hemorrhoids   . Migraines   . OA (osteoarthritis)    knees, hands, right shoulder  . Positive QuantiFERON-TB Gold test   . Shoulder pain   . Tear of right rotator cuff   . Type 2 diabetes mellitus ([place])    last A1c 5.4 on 04-13-2018 in epic (followed by pcp)  . Wears glasses     Past Surgical History:  Procedure Laterality Date  . ANAL FISSURE REPAIR    . ARTHOSCOPIC ROTAOR CUFF REPAIR Right 06/15/2018   Procedure: RIGHT SHOULDER ARTHROSCOPY, DEBRIDEMENT, SUBACROMIAL DECOMPRESSION, DISTAL CLAVICLE RESECTION, ROTATOR CUFF REPAIR;  Surgeon: [name], [name];  Location: [facility];  Service: Orthopedics;  Laterality: Right;  . COLONOSCOPY  05-26-2017   dr Henrene Pastor  . Hip Resurface  2006  . KNEE ARTHROSCOPY W/ MENISCAL REPAIR Bilateral right 1993; left 2010  . MASS EXCISION Right  04/21/2018   Procedure: EXCISION RIGHT THIGH SOFT TISSUE MASS;  Surgeon: [name], [name];  [facility];  Service: Orthopedics;  Laterality: Right;  . SEPTOPLASTY    . SHOULDER ARTHROSCOPY WITH ROTATOR CUFF REPAIR Left   . SLAP Tear Repair  2008  . TONSILLECTOMY    . TOTAL KNEE ARTHROPLASTY Left 12/08/2016   Procedure: LEFT TOTAL KNEE ARTHROPLASTY;  Surgeon: [name], [name];  [facility];  Service: Orthopedics;  Laterality: Left;  . TOTAL KNEE ARTHROPLASTY WITH REVISION COMPONENTS Left 04/21/2018   Procedure: Left knee polyethylene exchange ;  Surgeon: [name], [name];  [facility];  Service: Orthopedics;  Laterality: Left;    There were no vitals filed for this visit.  Subjective Assessment - 09/17/18 1101    Subjective  Shoulder is really sore.     Currently in Pain?  Yes    Pain Score  4    before meds 7/10   Pain Location  Shoulder    Pain Orientation  Right    Pain Descriptors / Indicators  Sore                       [facility] Adult PT Treatment/Exercise - 09/17/18 0001      Exercises  Exercises  Shoulder      Shoulder Exercises: Sidelying   External Rotation Weight (lbs)  3    External Rotation Limitations  ER to abd reach    Flexion Limitations  full range      Shoulder Exercises: Standing   Flexion Limitations  ball roll up wall to liftoff; overhead ABCs    Diagonals Limitations  1lb back to wall    Other Standing Exercises  triceps kicks 3lb      Shoulder Exercises: Pulleys   Flexion  2 minutes      Shoulder Exercises: Stretch   Other Shoulder Stretches  low W in door 3x20      Modalities   Modalities  Moist Heat      Moist Heat Therapy   Number Minutes Moist Heat  10 Minutes    Moist Heat Location  Shoulder   Right     Manual Therapy   Joint Mobilization  prone thoracic PA, cervical lateral glides, Rib ER; GHJ end range flexion    Soft tissue mobilization  bil upper trap, IASTM Rt pec minor               PT  Short Term Goals - 08/20/18 1106      PT SHORT TERM GOAL #1   Title  pt will be I with initial HEP given     Status  Achieved      PT SHORT TERM GOAL #2   Title  pt will be able to verbalize and demo techniques to prevent/ reduce edema/ pain via RICE and HEP)    Status  Achieved      PT SHORT TERM GOAL #3   Title  pt will increase L knee flexion to >/= 110 degrees and extension to >/= -5 degrees for functional progression     Status  Partially Met      PT SHORT TERM GOAL #4   Title  pt will be able to stand/ ambulate >/= 15 min with </= 5/10 pain with LRAD to promote functional mobililty (01/12/2017    Status  Achieved      PT SHORT TERM GOAL #5   Title  Patient will increase passive right shoulder passive ER 40 percent per protocol     Status  Achieved        PT Long Term Goals - 09/01/18 1147      PT LONG TERM GOAL #1   Status  --      PT LONG TERM GOAL #5   Title  He will increase FOTO score by >/= 42% limited to demonstrate improvement in functionat Discharge (02/06/2017)    Status  Unable to assess      PT LONG TERM GOAL #6   Title  Patient will demsotrate 4+/5 grossright shoulder strength in order to perfrom ADL's     Status  On-going      PT LONG TERM GOAL #7   Title  Patient will demsotrate WNL active ER and IR on the right wihtout pain in order to perform ADL's     Status  On-going            Plan - 09/17/18 1149    Clinical Impression Statement  Cont to increase weight and OH endurance, good form with noted fatigue. Pec minor tightness with decreased tension in pec major    PT Treatment/Interventions  Manual techniques;Patient/family education;Therapeutic activities;Therapeutic exercise;Cryotherapy;Vasopneumatic Device;Passive range of motion;Taping;Functional mobility training;Gait training;Stair training;Neuromuscular re-education;Moist Heat    PT  Next Visit Plan  follow protocol for RTC frepair from Dr Theda Sers.      focusing on AROM- full range,  stretching anterior with periscapular control.     PT Home Exercise Plan  pendulums, towel squeeze, PROM IR/ER in supine, resisted triceps extension yellow; dynamic stab ball on counter, bent over row, towel slide on table, wall walks, supine wand flx; AROM in mirror below 90, ball press into wall    Consulted and Agree with Plan of Care  Patient       Patient will benefit from skilled therapeutic intervention in order to improve the following deficits and impairments:  Pain, Decreased activity tolerance, Decreased range of motion, Decreased strength, Difficulty walking, Increased edema, Impaired UE functional use, Decreased endurance  Visit Diagnosis: Stiffness of right shoulder, not elsewhere classified  Acute pain of right shoulder     Problem List Patient Active Problem List   Diagnosis Date Noted  . Right shoulder injury 06/15/2018  . S/P arthroscopy of right shoulder 06/15/2018  . 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 OA (osteoarthritis) of knee 12/08/2016  . 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 Cervical facet joint syndrome 12/17/2015  . Chronic bilateral low back pain without sciatica 12/17/2015  . Primary osteoarthritis of left knee 12/17/2015    Maddux Vanscyoc C. Keily Lepp PT, DPT 09/17/18 12:18 PM   [facility] [place] [facility] [address] [place], [place], [address] Phone: [phone]   Fax:  [phone]  Name: Daniel CORNELIO, [name] MRN: [id number] Date of Birth: 06/25/1961

## 2018-09-20 ENCOUNTER — Ambulatory Visit: Payer: 59 | Admitting: Physical Therapy

## 2018-09-20 ENCOUNTER — Encounter: Payer: Self-pay | Admitting: Physical Therapy

## 2018-09-20 DIAGNOSIS — M25611 Stiffness of right shoulder, not elsewhere classified: Secondary | ICD-10-CM

## 2018-09-20 DIAGNOSIS — M25511 Pain in right shoulder: Secondary | ICD-10-CM | POA: Diagnosis not present

## 2018-09-20 NOTE — Therapy (Signed)
[place], [place], [address] Phone: [phone]   Fax:  [phone]  Physical Therapy Treatment  Patient Details  Name: Daniel VANATTA, MD MRN: [id number] Date of Birth: 10-22-1960 Referring Provider (PT): Dr [name]    Encounter Date: 09/20/2018  PT End of Session - 09/20/18 1204    Visit Number  24    Number of Visits  31    Date for PT Re-Evaluation  10/15/17    Authorization Type   7/34/2876 recert for knee; 81/15/7262 for recert on shoulder     PT Start Time  1200    PT Stop Time  1249    PT Time Calculation (min)  49 min    Activity Tolerance  Patient tolerated treatment well    Behavior During Therapy  [facility] for tasks assessed/performed       Past Medical History:  Diagnosis Date  . Anxiety   . Aortic atherosclerosis ([place])   . BPH (benign prostatic hyperplasia)   . Chronic pain syndrome    neck and low back  . DDD (degenerative disc disease), cervical   . Diverticulosis   . DJD (degenerative joint disease)   . Ganglion cyst    12 mm , right posterior knee  . Hyperlipidemia   . Internal hemorrhoids   . Migraines   . OA (osteoarthritis)    knees, hands, right shoulder  . Positive QuantiFERON-TB Gold test   . Shoulder pain   . Tear of right rotator cuff   . Type 2 diabetes mellitus ([place])    last A1c 5.4 on 04-13-2018 in epic (followed by pcp)  . Wears glasses     Past Surgical History:  Procedure Laterality Date  . ANAL FISSURE REPAIR    . ARTHOSCOPIC ROTAOR CUFF REPAIR Right 06/15/2018   Procedure: RIGHT SHOULDER ARTHROSCOPY, DEBRIDEMENT, SUBACROMIAL DECOMPRESSION, DISTAL CLAVICLE RESECTION, ROTATOR CUFF REPAIR;  Surgeon: [name], MD;  Location: [place];  Service: Orthopedics;  Laterality: Right;  . COLONOSCOPY  05-26-2017   dr Henrene Pastor  . Hip Resurface  2006  . KNEE ARTHROSCOPY W/ MENISCAL REPAIR Bilateral right 1993; left 2010  . MASS EXCISION Right  04/21/2018   Procedure: EXCISION RIGHT THIGH SOFT TISSUE MASS;  Surgeon: [name], MD;  [facility];  Service: Orthopedics;  Laterality: Right;  . SEPTOPLASTY    . SHOULDER ARTHROSCOPY WITH ROTATOR CUFF REPAIR Left   . SLAP Tear Repair  2008  . TONSILLECTOMY    . 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 TOTAL KNEE ARTHROPLASTY WITH REVISION COMPONENTS Left 04/21/2018   Procedure: Left knee polyethylene exchange ;  Surgeon: [name], MD;  [facility];  Service: Orthopedics;  Laterality: Left;    There were no vitals filed for this visit.  Subjective Assessment - 09/20/18 1203    Subjective  very stiff today, hurts         [facility] PT Assessment - 09/20/18 0001      Assessment   Next MD Visit  12/27                   [facility] Adult PT Treatment/Exercise - 09/20/18 0001      Exercises   Exercises  Shoulder      Shoulder Exercises: Supine   Flexion Limitations  1lb  Shoulder Exercises: Sidelying   ABduction Limitations  1lb x20    Other Sidelying Exercises  horizontal abduction 1lb      Shoulder Exercises: Pulleys   Flexion  2 minutes      Moist Heat Therapy   Number Minutes Moist Heat  15 Minutes    Moist Heat Location  Shoulder      Manual Therapy   Manual Therapy  Taping    Joint Mobilization  cervical lateral mobs, Rt first rib depression    Soft tissue mobilization  IASTM stretching    McConnell  Rt upper trap & first rib depression               PT Short Term Goals - 08/20/18 1106      PT SHORT TERM GOAL #1   Title  pt will be I with initial HEP given     Status  Achieved      PT SHORT TERM GOAL #2   Title  pt will be able to verbalize and demo techniques to prevent/ reduce edema/ pain via RICE and HEP)    Status  Achieved      PT SHORT TERM GOAL #3   Title  pt will increase L knee  flexion to >/= 110 degrees and extension to >/= -5 degrees for functional progression     Status  Partially Met      PT SHORT TERM GOAL #4   Title  pt will be able to stand/ ambulate >/= 15 min with </= 5/10 pain with LRAD to promote functional mobililty (01/12/2017    Status  Achieved      PT SHORT TERM GOAL #5   Title  Patient will increase passive right shoulder passive ER 40 percent per protocol     Status  Achieved        PT Long Term Goals - 09/01/18 1147      PT LONG TERM GOAL #1   Status  --      PT LONG TERM GOAL #5   Title  He will increase FOTO score by >/= 42% limited to demonstrate improvement in functionat Discharge (02/06/2017)    Status  Unable to assess      PT LONG TERM GOAL #6   Title  Patient will demsotrate 4+/5 grossright shoulder strength in order to perfrom ADL's     Status  On-going      PT LONG TERM GOAL #7   Title  Patient will demsotrate WNL active ER and IR on the right wihtout pain in order to perform ADL's     Status  On-going            Plan - 09/20/18 1236    Clinical Impression Statement  cervical stiffness and pec minor tightness limiting mobility. pt reported improved movmeent following treatment- noted decrease in shoulder hike.     PT Treatment/Interventions  Manual techniques;Patient/family education;Therapeutic activities;Therapeutic exercise;Cryotherapy;Vasopneumatic Device;Passive range of motion;Taping;Functional mobility training;Gait training;Stair training;Neuromuscular re-education;Moist Heat    PT Next Visit Plan  follow protocol for RTC frepair from Dr Theda Sers.      focusing on AROM- full range, stretching anterior with periscapular control.     PT Home Exercise Plan  pendulums, towel squeeze, PROM IR/ER in supine, resisted triceps extension yellow; dynamic stab ball on counter, bent over row, towel slide on table, wall walks, supine wand flx; AROM in mirror below 90, ball press into wall    Consulted and Agree with Plan of Care  Patient       Patient will benefit from skilled therapeutic intervention in order to improve the following deficits and impairments:  Pain, Decreased activity tolerance, Decreased range of motion, Decreased strength, Difficulty walking, Increased edema, Impaired UE functional use, Decreased endurance  Visit Diagnosis: Stiffness of right shoulder, not elsewhere classified  Acute pain of right shoulder     Problem List Patient Active Problem List   Diagnosis Date Noted  . Right shoulder injury 06/15/2018  . S/P arthroscopy of right shoulder 06/15/2018  . Failed total knee arthroplasty ([place]) 04/21/2018  . OA (osteoarthritis) of knee 12/08/2016  . 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 Cervical facet joint syndrome 12/17/2015  . Chronic bilateral low back pain without sciatica 12/17/2015  . Primary osteoarthritis of left knee 12/17/2015    Kris Burd C. Corabelle Spackman PT, DPT 09/20/18 12:37 PM   [facility] [address] [place], [place], [address] Phone: [phone]   Fax:  [phone]  Name: GUNNARD DORRANCE, MD MRN: [id number] Date of Birth: [birth year]

## 2018-09-21 MED FILL — TAMSULOSIN HCL 0.4 MG CAP: 0.4 | 90 days supply | Qty: 180 | Fill #0

## 2018-09-21 MED FILL — TADALAFIL 5 MG TABS: 5 | 30 days supply | Qty: 30 | Fill #0

## 2018-09-22 ENCOUNTER — Ambulatory Visit: Payer: 59 | Admitting: Physical Therapy

## 2018-09-22 ENCOUNTER — Encounter: Payer: Self-pay | Admitting: Physical Therapy

## 2018-09-22 DIAGNOSIS — M542 Cervicalgia: Secondary | ICD-10-CM | POA: Diagnosis not present

## 2018-09-22 DIAGNOSIS — M9901 Segmental and somatic dysfunction of cervical region: Secondary | ICD-10-CM | POA: Diagnosis not present

## 2018-09-22 DIAGNOSIS — M25611 Stiffness of right shoulder, not elsewhere classified: Secondary | ICD-10-CM

## 2018-09-22 DIAGNOSIS — M546 Pain in thoracic spine: Secondary | ICD-10-CM | POA: Diagnosis not present

## 2018-09-22 DIAGNOSIS — M545 Low back pain: Secondary | ICD-10-CM | POA: Diagnosis not present

## 2018-09-22 DIAGNOSIS — M25511 Pain in right shoulder: Secondary | ICD-10-CM | POA: Diagnosis not present

## 2018-09-22 NOTE — Therapy (Signed)
[name], [place], [address] Phone: [phone]   Fax:  [phone]  Physical Therapy Treatment  Patient Details  Name: Daniel LEVINSON, MD MRN: [id number] Date of Birth: 1961/06/30 Referring Provider (PT): Dr [name]    Encounter Date: 09/22/2018  PT End of Session - 09/22/18 1149    Visit Number  25    Number of Visits  31    Date for PT Re-Evaluation  10/15/17    Authorization Type   2/99/3716 recert for knee; 96/78/9381 for recert on shoulder     PT Start Time  1145    PT Stop Time  1241    PT Time Calculation (min)  56 min    Activity Tolerance  Patient tolerated treatment well    Behavior During Therapy  [facility] for tasks assessed/performed       Past Medical History:  Diagnosis Date  . Anxiety   . Aortic atherosclerosis ([place])   . BPH (benign prostatic hyperplasia)   . Chronic pain syndrome    neck and low back  . DDD (degenerative disc disease), cervical   . Diverticulosis   . DJD (degenerative joint disease)   . Ganglion cyst    12 mm , right posterior knee  . Hyperlipidemia   . Internal hemorrhoids   . Migraines   . OA (osteoarthritis)    knees, hands, right shoulder  . Positive QuantiFERON-TB Gold test   . Shoulder pain   . Tear of right rotator cuff   . Type 2 diabetes mellitus ([place])    last A1c 5.4 on 04-13-2018 in epic (followed by pcp)  . Wears glasses     Past Surgical History:  Procedure Laterality Date  . ANAL FISSURE REPAIR    . ARTHOSCOPIC ROTAOR CUFF REPAIR Right 06/15/2018   Procedure: RIGHT SHOULDER ARTHROSCOPY, DEBRIDEMENT, SUBACROMIAL DECOMPRESSION, DISTAL CLAVICLE RESECTION, ROTATOR CUFF REPAIR;  Surgeon: [name], MD;  Location: [place];  Service: Orthopedics;  Laterality: Right;  . COLONOSCOPY  05-26-2017   dr Henrene Pastor  . Hip Resurface  2006  . KNEE ARTHROSCOPY W/ MENISCAL REPAIR Bilateral right 1993; left 2010  . MASS EXCISION Right  04/21/2018   Procedure: EXCISION RIGHT THIGH SOFT TISSUE MASS;  Surgeon: [name], MD;  [facility];  Service: Orthopedics;  Laterality: Right;  . SEPTOPLASTY    . SHOULDER ARTHROSCOPY WITH ROTATOR CUFF REPAIR Left   . SLAP Tear Repair  2008  . TONSILLECTOMY    . 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 TOTAL KNEE ARTHROPLASTY WITH REVISION COMPONENTS Left 04/21/2018   Procedure: Left knee polyethylene exchange ;  Surgeon: [name], MD;  [facility];  Service: Orthopedics;  Laterality: Left;    There were no vitals filed for this visit.  Subjective Assessment - 09/22/18 1149    Subjective  shoulder is a little sore but more stiffness in cspine.     Patient Stated Goals  Retrun to normal                        [facility] Adult PT Treatment/Exercise - 09/22/18 0001      Exercises   Exercises  Shoulder      Shoulder Exercises: Supine   Horizontal ABduction  15 reps;Theraband    Theraband Level (Shoulder Horizontal ABduction)  Level 2 (Red)    Other Supine Exercises  large range ABCs green ply ball      Shoulder Exercises: Standing   Other Standing Exercises  wall push ups    Other Standing Exercises  abd band behind back      Shoulder Exercises: ROM/Strengthening   UBE (Upper Arm Bike)  2/2 L1      Shoulder Exercises: Body Blade   Flexion Limitations  supine to 90; 3x30s neutral, 3x30 pronated      Moist Heat Therapy   Number Minutes Moist Heat  15 Minutes    Moist Heat Location  Shoulder      Manual Therapy   Joint Mobilization  C5-6 glides Rt to Lt, rt first rib depression    Soft tissue mobilization  right upper trap, suboccoipitals               PT Short Term Goals - 08/20/18 1106      PT SHORT TERM GOAL #1   Title  pt will be I with initial HEP given     Status  Achieved      PT SHORT TERM  GOAL #2   Title  pt will be able to verbalize and demo techniques to prevent/ reduce edema/ pain via RICE and HEP)    Status  Achieved      PT SHORT TERM GOAL #3   Title  pt will increase L knee flexion to >/= 110 degrees and extension to >/= -5 degrees for functional progression     Status  Partially Met      PT SHORT TERM GOAL #4   Title  pt will be able to stand/ ambulate >/= 15 min with </= 5/10 pain with LRAD to promote functional mobililty (01/12/2017    Status  Achieved      PT SHORT TERM GOAL #5   Title  Patient will increase passive right shoulder passive ER 40 percent per protocol     Status  Achieved        PT Long Term Goals - 09/01/18 1147      PT LONG TERM GOAL #1   Status  --      PT LONG TERM GOAL #5   Title  He will increase FOTO score by >/= 42% limited to demonstrate improvement in functionat Discharge (02/06/2017)    Status  Unable to assess      PT LONG TERM GOAL #6   Title  Patient will demsotrate 4+/5 grossright shoulder strength in order to perfrom ADL's     Status  On-going      PT LONG TERM GOAL #7   Title  Patient will demsotrate WNL active ER and IR on the right wihtout pain in order to perform ADL's     Status  On-going            Plan - 09/22/18 1244    Clinical Impression Statement  continued cervical stiffness noted and reduced with manual therapy. endurance challenged in supine with body blade. good scapular control in wall push ups and will progress CKC as tolerated    PT Treatment/Interventions  Manual techniques;Patient/family education;Therapeutic activities;Therapeutic exercise;Cryotherapy;Vasopneumatic Device;Passive range of motion;Taping;Functional mobility training;Gait training;Stair training;Neuromuscular re-education;Moist Heat    PT Next Visit Plan  add plyometric tossing, progress CKC    PT Home Exercise Plan  pendulums, towel squeeze, PROM IR/ER in supine, resisted triceps extension yellow; dynamic stab ball on counter, bent  over row, towel slide on table, wall  walks, supine wand flx; AROM in mirror below 90, ball press into wall; wall push up, abd HBB, supine horiz abd, upper trap & levator stretch    Consulted and Agree with Plan of Care  Patient       Patient will benefit from skilled therapeutic intervention in order to improve the following deficits and impairments:  Pain, Decreased activity tolerance, Decreased range of motion, Decreased strength, Difficulty walking, Increased edema, Impaired UE functional use, Decreased endurance  Visit Diagnosis: Stiffness of right shoulder, not elsewhere classified  Acute pain of right shoulder     Problem List Patient Active Problem List   Diagnosis Date Noted  . Right shoulder injury 06/15/2018  . S/P arthroscopy of right shoulder 06/15/2018  . Failed total knee arthroplasty ([place]) 04/21/2018  . OA (osteoarthritis) of knee 12/08/2016  . 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 Cervical facet joint syndrome 12/17/2015  . Chronic bilateral low back pain without sciatica 12/17/2015  . Primary osteoarthritis of left knee 12/17/2015   Rida Loudin C. Javell Blackburn PT, DPT 09/22/18 12:50 PM   [facility] [address] [place], [place], [address] Phone: [phone]   Fax:  [phone]  Name: STEFAN MARKARIAN, MD MRN: [id number] Date of Birth: 1961-07-19

## 2018-09-27 ENCOUNTER — Ambulatory Visit: Payer: 59 | Admitting: Physical Therapy

## 2018-09-27 ENCOUNTER — Encounter: Payer: Self-pay | Admitting: Physical Therapy

## 2018-09-27 DIAGNOSIS — M25511 Pain in right shoulder: Secondary | ICD-10-CM

## 2018-09-27 DIAGNOSIS — M25611 Stiffness of right shoulder, not elsewhere classified: Secondary | ICD-10-CM | POA: Diagnosis not present

## 2018-09-27 NOTE — Therapy (Signed)
[place], [place], [address] Phone: [phone]   Fax:  [phone]  Physical Therapy Treatment  Patient Details  Name: Daniel GONDEK, [name] MRN: [id number] Date of Birth: 18-Jan-1961 Referring Provider (PT): Dr [name]    Encounter Date: 09/27/2018  PT End of Session - 09/27/18 1155    Visit Number  26    Number of Visits  31    Date for PT Re-Evaluation  10/15/17    Authorization Type   4/93/5521 recert for knee; 74/71/5953 for recert on shoulder     PT Start Time  1108   pt arrived late   PT Stop Time  1158    PT Time Calculation (min)  50 min    Activity Tolerance  Patient tolerated treatment well    Behavior During Therapy  [facility] for tasks assessed/performed       Past Medical History:  Diagnosis Date  . Anxiety   . Aortic atherosclerosis ([place])   . BPH (benign prostatic hyperplasia)   . Chronic pain syndrome    neck and low back  . DDD (degenerative disc disease), cervical   . Diverticulosis   . DJD (degenerative joint disease)   . Ganglion cyst    12 mm , right posterior knee  . Hyperlipidemia   . Internal hemorrhoids   . Migraines   . OA (osteoarthritis)    knees, hands, right shoulder  . Positive QuantiFERON-TB Gold test   . Shoulder pain   . Tear of right rotator cuff   . Type 2 diabetes mellitus ([place])    last A1c 5.4 on 04-13-2018 in epic (followed by pcp)  . Wears glasses     Past Surgical History:  Procedure Laterality Date  . ANAL FISSURE REPAIR    . ARTHOSCOPIC ROTAOR CUFF REPAIR Right 06/15/2018   Procedure: RIGHT SHOULDER ARTHROSCOPY, DEBRIDEMENT, SUBACROMIAL DECOMPRESSION, DISTAL CLAVICLE RESECTION, ROTATOR CUFF REPAIR;  Surgeon: [name], [name];  Location: [place];  Service: Orthopedics;  Laterality: Right;  . COLONOSCOPY  05-26-2017   dr Henrene Pastor  . Hip Resurface  2006  . KNEE ARTHROSCOPY W/ MENISCAL REPAIR Bilateral right 1993; left 2010  .  MASS EXCISION Right 04/21/2018   Procedure: EXCISION RIGHT THIGH SOFT TISSUE MASS;  Surgeon: [name], [name];  [facility];  Service: Orthopedics;  Laterality: Right;  . SEPTOPLASTY    . SHOULDER ARTHROSCOPY WITH ROTATOR CUFF REPAIR Left   . SLAP Tear Repair  2008  . TONSILLECTOMY    . TOTAL KNEE ARTHROPLASTY Left 12/08/2016   Procedure: LEFT TOTAL KNEE ARTHROPLASTY;  Surgeon: [name], [name];  [facility];  Service: Orthopedics;  Laterality: Left;  . TOTAL KNEE ARTHROPLASTY WITH REVISION COMPONENTS Left 04/21/2018   Procedure: Left knee polyethylene exchange ;  Surgeon: [name], [name];  [facility];  Service: Orthopedics;  Laterality: Left;    There were no vitals filed for this visit.  Subjective Assessment - 09/27/18 1112    Subjective  Feeling a little better. Went to chiropractor on Friday. Has 34 year old grand daughter who kept him moving all weekend.     Currently in Pain?  Yes    Pain Score  4     Pain Location  Shoulder    Pain Orientation  Right         [facility] PT Assessment - 09/27/18 0001      Assessment   Medical Diagnosis  Right RTC repair  Referring Provider (PT)  Dr [name]     Onset Date/Surgical Date  06/15/18    Next [name] Visit  12/27      AROM   Right Shoulder Flexion  134 Degrees    Right Shoulder ABduction  110 Degrees    Right Shoulder Internal Rotation  --   midline at S1   Right Shoulder External Rotation  --   suboccipital without compensation     Strength   Strength Assessment Site  Shoulder    Right/Left Shoulder  Right    Right Shoulder Flexion  4-/5    Right Shoulder Extension  4+/5    Right Shoulder ABduction  4-/5    Right Shoulder Internal Rotation  5/5    Right Shoulder External Rotation  4/5    Right Shoulder Horizontal ABduction  4+/5                   [facility] Adult PT Treatment/Exercise - 09/27/18 0001      Shoulder Exercises: Supine   Flexion  10 reps   2 sets   Flexion Limitations  1lb  on wedge      Shoulder Exercises: Standing   ABduction Limitations  abduction +ER yellow tband    Other Standing Exercises  tennis ball bounce/toss      Shoulder Exercises: ROM/Strengthening   UBE (Upper Arm Bike)  2/2 L3      Shoulder Exercises: Stretch   Internal Rotation Stretch Limitations  strap behind back 5x5s    Other Shoulder Stretches  low W in door 3x20      Moist Heat Therapy   Number Minutes Moist Heat  15 Minutes    Moist Heat Location  Shoulder               PT Short Term Goals - 08/20/18 1106      PT SHORT TERM GOAL #1   Title  pt will be I with initial HEP given     Status  Achieved      PT SHORT TERM GOAL #2   Title  pt will be able to verbalize and demo techniques to prevent/ reduce edema/ pain via RICE and HEP)    Status  Achieved      PT SHORT TERM GOAL #3   Title  pt will increase L knee flexion to >/= 110 degrees and extension to >/= -5 degrees for functional progression     Status  Partially Met      PT SHORT TERM GOAL #4   Title  pt will be able to stand/ ambulate >/= 15 min with </= 5/10 pain with LRAD to promote functional mobililty (01/12/2017    Status  Achieved      PT SHORT TERM GOAL #5   Title  Patient will increase passive right shoulder passive ER 40 percent per protocol     Status  Achieved        PT Long Term Goals - 09/27/18 1113      PT LONG TERM GOAL #5   Title  He will increase FOTO score by >/= 42% limited to demonstrate improvement in functionat Discharge (02/06/2017)    Baseline  not in FOTO for shoulder    Status  Deferred      PT LONG TERM GOAL #6   Title  Patient will demsotrate 4+/5 grossright shoulder strength in order to perfrom ADL's     Baseline  see fowsheet      PT LONG  TERM GOAL #7   Title  Patient will demsotrate WNL active ER and IR on the right wihtout pain in order to perform ADL's     Baseline  see flowsheet    Status  On-going            Plan - 09/27/18 1135    Clinical Impression  Statement  Pt cont to demo functional improvements, most notable in IR/ER. Fatigues with repetitions. Updated HEP today.     PT Treatment/Interventions  Manual techniques;Patient/family education;Therapeutic activities;Therapeutic exercise;Cryotherapy;Vasopneumatic Device;Passive range of motion;Taping;Functional mobility training;Gait training;Stair training;Neuromuscular re-education;Moist Heat    PT Next Visit Plan  challenge functional endurance    PT Home Exercise Plan  updated 12/16: wall walk+liftoff, AROM in mirror, wall push up, uppertrap & levator & door pec stretch, abd HBB, IR behind back, supine horiz abd    Consulted and Agree with Plan of Care  Patient       Patient will benefit from skilled therapeutic intervention in order to improve the following deficits and impairments:  Pain, Decreased activity tolerance, Decreased range of motion, Decreased strength, Difficulty walking, Increased edema, Impaired UE functional use, Decreased endurance  Visit Diagnosis: Stiffness of right shoulder, not elsewhere classified  Acute pain of right shoulder     Problem List Patient Active Problem List   Diagnosis Date Noted  . Right shoulder injury 06/15/2018  . S/P arthroscopy of right shoulder 06/15/2018  . 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 OA (osteoarthritis) of knee 12/08/2016  . 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 Cervical facet joint syndrome 12/17/2015  . Chronic bilateral low back pain without sciatica 12/17/2015  . Primary osteoarthritis of left knee 12/17/2015   Luverna Degenhart C. Kerissa Coia PT, DPT 09/27/18 12:13 PM   [facility] [address] [place], [place], [address] Phone: [phone]   Fax:  [phone]  Name: Daniel POITRA, [name] MRN: [id number] Date of Birth: 1961/06/26

## 2018-09-29 ENCOUNTER — Encounter: Payer: 59 | Admitting: Physical Therapy

## 2018-09-30 MED FILL — levoFLOXacin 500 MG TABS: 500 | 7 days supply | Qty: 7 | Fill #0

## 2018-10-04 ENCOUNTER — Ambulatory Visit: Payer: 59 | Admitting: Physical Therapy

## 2018-10-04 ENCOUNTER — Encounter: Payer: Self-pay | Admitting: Physical Therapy

## 2018-10-04 DIAGNOSIS — M25511 Pain in right shoulder: Secondary | ICD-10-CM

## 2018-10-04 DIAGNOSIS — M25611 Stiffness of right shoulder, not elsewhere classified: Secondary | ICD-10-CM

## 2018-10-04 MED FILL — ISONIAZID 300 MG TABLET: 300 | 30 days supply | Qty: 30 | Fill #2

## 2018-10-04 MED FILL — metFORMIN HCL 1000 MG TABS: 1000 | 90 days supply | Qty: 180 | Fill #0

## 2018-10-04 MED FILL — RIZATRIPTAN 5 MG ODT: 5 | 30 days supply | Qty: 15 | Fill #0

## 2018-10-04 MED FILL — diazePAM 5 MG TABS: 5 | 30 days supply | Qty: 60 | Fill #2

## 2018-10-04 MED FILL — CELECOXIB 200 MG CAP: 200 | 90 days supply | Qty: 90 | Fill #0

## 2018-10-04 NOTE — Therapy (Signed)
[place], [place], [address] Phone: [phone]   Fax:  [phone]  Physical Therapy Treatment  Patient Details  Name: Daniel BARBEAU, MD MRN: [id number] Date of Birth: December 15, 1960 Referring Provider (PT): Dr [name]    Encounter Date: 10/04/2018  PT End of Session - 10/04/18 1143    Visit Number  27    Number of Visits  31    Date for PT Re-Evaluation  10/15/17    PT Start Time  1147    PT Stop Time  1244    PT Time Calculation (min)  57 min    Activity Tolerance  Patient tolerated treatment well    Behavior During Therapy  [facility] for tasks assessed/performed       Past Medical History:  Diagnosis Date  . Anxiety   . Aortic atherosclerosis ([place])   . BPH (benign prostatic hyperplasia)   . Chronic pain syndrome    neck and low back  . DDD (degenerative disc disease), cervical   . Diverticulosis   . DJD (degenerative joint disease)   . Ganglion cyst    12 mm , right posterior knee  . Hyperlipidemia   . Internal hemorrhoids   . Migraines   . OA (osteoarthritis)    knees, hands, right shoulder  . Positive QuantiFERON-TB Gold test   . Shoulder pain   . Tear of right rotator cuff   . Type 2 diabetes mellitus ([place])    last A1c 5.4 on 04-13-2018 in epic (followed by pcp)  . Wears glasses     Past Surgical History:  Procedure Laterality Date  . ANAL FISSURE REPAIR    . ARTHOSCOPIC ROTAOR CUFF REPAIR Right 06/15/2018   Procedure: RIGHT SHOULDER ARTHROSCOPY, DEBRIDEMENT, SUBACROMIAL DECOMPRESSION, DISTAL CLAVICLE RESECTION, ROTATOR CUFF REPAIR;  Surgeon: [name], MD;  Location: [place];  Service: Orthopedics;  Laterality: Right;  . COLONOSCOPY  05-26-2017   dr Henrene Pastor  . Hip Resurface  2006  . KNEE ARTHROSCOPY W/ MENISCAL REPAIR Bilateral right 1993; left 2010  . MASS EXCISION Right 04/21/2018   Procedure: EXCISION RIGHT THIGH SOFT TISSUE MASS;  Surgeon: [name], MD;  [facility];  Service: Orthopedics;  Laterality: Right;  . SEPTOPLASTY    . SHOULDER ARTHROSCOPY WITH ROTATOR CUFF REPAIR Left   . SLAP Tear Repair  2008  . TONSILLECTOMY    . 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 TOTAL KNEE ARTHROPLASTY WITH REVISION COMPONENTS Left 04/21/2018   Procedure: Left knee polyethylene exchange ;  Surgeon: [name], MD;  [facility];  Service: Orthopedics;  Laterality: Left;    There were no vitals filed for this visit.  Subjective Assessment - 10/04/18 1148    Subjective  It was about an 8/10 before I took the meds. Lateral cervical joints in lower cervical and pec tightness.     Patient Stated Goals  Retrun to normal     Currently in Pain?  Yes    Pain Score  5                        [facility] Adult PT Treatment/Exercise - 10/04/18 0001      Shoulder Exercises: Supine   Theraband Level (Shoulder Diagonals)  Level 1 (Yellow)      Shoulder Exercises: Seated  ABduction Weight (lbs)  1    ABduction Limitations  at 90, small circles fwd & back       Shoulder Exercises: Standing   Other Standing Exercises  W arms at wall 1lb      Shoulder Exercises: ROM/Strengthening   UBE (Upper Arm Bike)  2/2 L3      Shoulder Exercises: Stretch   Other Shoulder Stretches  rotation SNAG      Moist Heat Therapy   Number Minutes Moist Heat  15 Minutes    Moist Heat Location  Shoulder      Manual Therapy   Joint Mobilization  Rt first rib depression, C5, 6 Rt to Lt glides    Soft tissue mobilization  Rt scalenes, upper trap, levator, suboccipitals, pec minor               PT Short Term Goals - 08/20/18 1106      PT SHORT TERM GOAL #1   Title  pt will be I with initial HEP given     Status  Achieved      PT SHORT TERM GOAL #2   Title  pt will be able to verbalize and demo  techniques to prevent/ reduce edema/ pain via RICE and HEP)    Status  Achieved      PT SHORT TERM GOAL #3   Title  pt will increase L knee flexion to >/= 110 degrees and extension to >/= -5 degrees for functional progression     Status  Partially Met      PT SHORT TERM GOAL #4   Title  pt will be able to stand/ ambulate >/= 15 min with </= 5/10 pain with LRAD to promote functional mobililty (01/12/2017    Status  Achieved      PT SHORT TERM GOAL #5   Title  Patient will increase passive right shoulder passive ER 40 percent per protocol     Status  Achieved        PT Long Term Goals - 09/27/18 1113      PT LONG TERM GOAL #5   Title  He will increase FOTO score by >/= 42% limited to demonstrate improvement in functionat Discharge (02/06/2017)    Baseline  not in FOTO for shoulder    Status  Deferred      PT LONG TERM GOAL #6   Title  Patient will demsotrate 4+/5 grossright shoulder strength in order to perfrom ADL's     Baseline  see fowsheet      PT LONG TERM GOAL #7   Title  Patient will demsotrate WNL active ER and IR on the right wihtout pain in order to perform ADL's     Baseline  see flowsheet    Status  On-going            Plan - 10/04/18 1254    Clinical Impression Statement  Improved cervical motion after reducing tension in pectoralis. Fatigue with exercises reporting a sensation of tightness. Will cont to challenge functional endurance    PT Treatment/Interventions  Manual techniques;Patient/family education;Therapeutic activities;Therapeutic exercise;Cryotherapy;Vasopneumatic Device;Passive range of motion;Taping;Functional mobility training;Gait training;Stair training;Neuromuscular re-education;Moist Heat    PT Next Visit Plan  cont W arms- larger range if tolerated,     PT Home Exercise Plan  updated 12/16: wall walk+liftoff, AROM in mirror, wall push up, uppertrap & levator & door pec stretch, abd HBB, IR behind back, supine horiz abd; w arms, SNAG, supine  diagonals  Consulted and Agree with Plan of Care  Patient       Patient will benefit from skilled therapeutic intervention in order to improve the following deficits and impairments:  Pain, Decreased activity tolerance, Decreased range of motion, Decreased strength, Difficulty walking, Increased edema, Impaired UE functional use, Decreased endurance  Visit Diagnosis: Stiffness of right shoulder, not elsewhere classified  Acute pain of right shoulder     Problem List Patient Active Problem List   Diagnosis Date Noted  . Right shoulder injury 06/15/2018  . S/P arthroscopy of right shoulder 06/15/2018  . Failed total knee arthroplasty ([place]) 04/21/2018  . OA (osteoarthritis) of knee 12/08/2016  . 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 Cervical facet joint syndrome 12/17/2015  . Chronic bilateral low back pain without sciatica 12/17/2015  . Primary osteoarthritis of left knee 12/17/2015    Lakota Markgraf C. Uchechi Denison PT, DPT 10/04/18 12:57 PM   [place] [facility] [address] [place], [place], [address] Phone: [phone]   Fax:  [phone]  Name: KWAME RYLAND, MD MRN: [id number] Date of Birth: [birth year]

## 2018-10-08 ENCOUNTER — Encounter: Payer: Self-pay | Admitting: Physical Therapy

## 2018-10-08 ENCOUNTER — Ambulatory Visit: Payer: 59 | Admitting: Physical Therapy

## 2018-10-08 DIAGNOSIS — M25511 Pain in right shoulder: Secondary | ICD-10-CM

## 2018-10-08 DIAGNOSIS — M25611 Stiffness of right shoulder, not elsewhere classified: Secondary | ICD-10-CM

## 2018-10-08 MED FILL — ZOLPIDEM TARTRATE 10 MG TAB: 10 | 90 days supply | Qty: 90 | Fill #1

## 2018-10-08 NOTE — Therapy (Signed)
[place], [place], [address] Phone: [phone]   Fax:  [phone]  Physical Therapy Treatment  Patient Details  Name: Daniel HALE, MD MRN: [id number] Date of Birth: [birth year] Referring Provider (PT): Dr [name]    Encounter Date: 10/08/2018  PT End of Session - 10/08/18 1111    Visit Number  28    Number of Visits  31    Date for PT Re-Evaluation  10/15/17    Authorization Type   6/80/3212 recert for knee; 24/82/5003 for recert on shoulder     PT Start Time  1110   pt arrived late   PT Stop Time  1200    PT Time Calculation (min)  50 min    Activity Tolerance  Patient tolerated treatment well    Behavior During Therapy  [facility] for tasks assessed/performed       Past Medical History:  Diagnosis Date  . Anxiety   . Aortic atherosclerosis ([place])   . BPH (benign prostatic hyperplasia)   . Chronic pain syndrome    neck and low back  . DDD (degenerative disc disease), cervical   . Diverticulosis   . DJD (degenerative joint disease)   . Ganglion cyst    12 mm , right posterior knee  . Hyperlipidemia   . Internal hemorrhoids   . Migraines   . OA (osteoarthritis)    knees, hands, right shoulder  . Positive QuantiFERON-TB Gold test   . Shoulder pain   . Tear of right rotator cuff   . Type 2 diabetes mellitus ([place])    last A1c 5.4 on 04-13-2018 in epic (followed by pcp)  . Wears glasses     Past Surgical History:  Procedure Laterality Date  . ANAL FISSURE REPAIR    . ARTHOSCOPIC ROTAOR CUFF REPAIR Right 06/15/2018   Procedure: RIGHT SHOULDER ARTHROSCOPY, DEBRIDEMENT, SUBACROMIAL DECOMPRESSION, DISTAL CLAVICLE RESECTION, ROTATOR CUFF REPAIR;  Surgeon: [name], MD;  Location: [place];  Service: Orthopedics;  Laterality: Right;  . COLONOSCOPY  05-26-2017   dr Henrene Pastor  . Hip Resurface  2006  . KNEE ARTHROSCOPY W/ MENISCAL REPAIR Bilateral right 1993; left 2010  .  MASS EXCISION Right 04/21/2018   Procedure: EXCISION RIGHT THIGH SOFT TISSUE MASS;  Surgeon: [name], MD;  [facility];  Service: Orthopedics;  Laterality: Right;  . SEPTOPLASTY    . SHOULDER ARTHROSCOPY WITH ROTATOR CUFF REPAIR Left   . SLAP Tear Repair  2008  . TONSILLECTOMY    . 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 TOTAL KNEE ARTHROPLASTY WITH REVISION COMPONENTS Left 04/21/2018   Procedure: Left knee polyethylene exchange ;  Surgeon: [name], MD;  [facility];  Service: Orthopedics;  Laterality: Left;    There were no vitals filed for this visit.      [facility] PT Assessment - 10/08/18 0001      Assessment   Medical Diagnosis  Right RTC repair     Referring Provider (PT)  Dr [name]     Onset Date/Surgical Date  06/15/18    Next MD Visit  12/30                   [facility] Adult PT Treatment/Exercise - 10/08/18 0001      Shoulder Exercises: Standing   Theraband Level (Shoulder External  Rotation)  Level 1 (Yellow)    External Rotation Limitations  elbows to wall    ABduction Limitations  tiny arm circles 1lb each    Row Limitations  upright row 2lb on bar    Other Standing Exercises  large range Warms    Other Standing Exercises  wall clocks yellow tband      Shoulder Exercises: ROM/Strengthening   UBE (Upper Arm Bike)  2/2 L4      Shoulder Exercises: Stretch   Internal Rotation Stretch Limitations  strap behind back 5x10s      Vasopneumatic   Number Minutes Vasopneumatic   15 minutes    Vasopnuematic Location   Shoulder    Vasopneumatic Pressure  Low    Vasopneumatic Temperature   lowest      Manual Therapy   Passive ROM  pectoralis stretching, ext rot stretching               PT Short Term Goals - 08/20/18 1106      PT SHORT TERM GOAL #1   Title  pt will be I with initial HEP given      Status  Achieved      PT SHORT TERM GOAL #2   Title  pt will be able to verbalize and demo techniques to prevent/ reduce edema/ pain via RICE and HEP)    Status  Achieved      PT SHORT TERM GOAL #3   Title  pt will increase L knee flexion to >/= 110 degrees and extension to >/= -5 degrees for functional progression     Status  Partially Met      PT SHORT TERM GOAL #4   Title  pt will be able to stand/ ambulate >/= 15 min with </= 5/10 pain with LRAD to promote functional mobililty (01/12/2017    Status  Achieved      PT SHORT TERM GOAL #5   Title  Patient will increase passive right shoulder passive ER 40 percent per protocol     Status  Achieved        PT Long Term Goals - 09/27/18 1113      PT LONG TERM GOAL #5   Title  He will increase FOTO score by >/= 42% limited to demonstrate improvement in functionat Discharge (02/06/2017)    Baseline  not in FOTO for shoulder    Status  Deferred      PT LONG TERM GOAL #6   Title  Patient will demsotrate 4+/5 grossright shoulder strength in order to perfrom ADL's     Baseline  see fowsheet      PT LONG TERM GOAL #7   Title  Patient will demsotrate WNL active ER and IR on the right wihtout pain in order to perform ADL's     Baseline  see flowsheet    Status  On-going            Plan - 10/08/18 1137    Clinical Impression Statement  Passive IR behind back with strap to T11 at midline, actively to L1 with good posture and feeling of weakness- denied impingement symptoms. Good periscapular control with increased challenges but fatigue with endurance.     PT Treatment/Interventions  Manual techniques;Patient/family education;Therapeutic activities;Therapeutic exercise;Cryotherapy;Vasopneumatic Device;Passive range of motion;Taping;Functional mobility training;Gait training;Stair training;Neuromuscular re-education;Moist Heat    PT Next Visit Plan  continue endurance & overhead challenges    PT Home Exercise Plan  updated 12/16:  wall walk+liftoff, AROM in mirror, wall  push up, uppertrap & levator & door pec stretch, abd HBB, IR behind back, supine horiz abd; w arms, SNAG, supine diagonals    Consulted and Agree with Plan of Care  Patient       Patient will benefit from skilled therapeutic intervention in order to improve the following deficits and impairments:  Pain, Decreased activity tolerance, Decreased range of motion, Decreased strength, Difficulty walking, Increased edema, Impaired UE functional use, Decreased endurance  Visit Diagnosis: Stiffness of right shoulder, not elsewhere classified  Acute pain of right shoulder     Problem List Patient Active Problem List   Diagnosis Date Noted  . Right shoulder injury 06/15/2018  . S/P arthroscopy of right shoulder 06/15/2018  . Failed total knee arthroplasty ([place]) 04/21/2018  . OA (osteoarthritis) of knee 12/08/2016  . 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 Cervical facet joint syndrome 12/17/2015  . Chronic bilateral low back pain without sciatica 12/17/2015  . Primary osteoarthritis of left knee 12/17/2015   Albana Saperstein C. Ezreal Turay PT, DPT 10/08/18 11:52 AM   [facility] [facility] [address] [place], [place], [address] Phone: [phone]   Fax:  [phone]  Name: LOEL BETANCUR, MD MRN: [id number] Date of Birth: 1961/05/16

## 2018-10-11 DIAGNOSIS — Z9889 Other specified postprocedural states: Secondary | ICD-10-CM | POA: Diagnosis not present

## 2018-10-11 DIAGNOSIS — M25511 Pain in right shoulder: Secondary | ICD-10-CM | POA: Diagnosis not present

## 2018-10-12 DIAGNOSIS — M9901 Segmental and somatic dysfunction of cervical region: Secondary | ICD-10-CM | POA: Diagnosis not present

## 2018-10-12 DIAGNOSIS — M542 Cervicalgia: Secondary | ICD-10-CM | POA: Diagnosis not present

## 2018-10-12 DIAGNOSIS — M546 Pain in thoracic spine: Secondary | ICD-10-CM | POA: Diagnosis not present

## 2018-10-12 DIAGNOSIS — M545 Low back pain: Secondary | ICD-10-CM | POA: Diagnosis not present

## 2018-10-20 ENCOUNTER — Ambulatory Visit: Payer: 59 | Attending: Student | Admitting: Physical Therapy

## 2018-10-20 ENCOUNTER — Encounter: Payer: Self-pay | Admitting: Physical Therapy

## 2018-10-20 DIAGNOSIS — M25662 Stiffness of left knee, not elsewhere classified: Secondary | ICD-10-CM | POA: Diagnosis not present

## 2018-10-20 DIAGNOSIS — M25611 Stiffness of right shoulder, not elsewhere classified: Secondary | ICD-10-CM | POA: Diagnosis not present

## 2018-10-20 DIAGNOSIS — M6281 Muscle weakness (generalized): Secondary | ICD-10-CM | POA: Insufficient documentation

## 2018-10-20 DIAGNOSIS — Z79899 Other long term (current) drug therapy: Secondary | ICD-10-CM | POA: Diagnosis not present

## 2018-10-20 DIAGNOSIS — M25511 Pain in right shoulder: Secondary | ICD-10-CM | POA: Diagnosis not present

## 2018-10-20 MED FILL — TADALAFIL 5 MG TABS: 5 | 30 days supply | Qty: 30 | Fill #1

## 2018-10-20 NOTE — Therapy (Signed)
[place], [place], [address] Phone: [phone]   Fax:  [phone]  Physical Therapy Treatment/ERO  Patient Details  Name: Daniel COOLE, [name] MRN: [id number] Date of Birth: [birth year] Referring Provider (PT): Dr [name]    Encounter Date: 10/20/2018  PT End of Session - 10/20/18 1207    Visit Number  29    Number of Visits  12    Date for PT Re-Evaluation  11/19/18    Authorization Type   9/50/9326 recert for knee; 71/24/5809 for recert on shoulder     PT Start Time  1207   pt arrived late   PT Stop Time  1246    PT Time Calculation (min)  39 min    Activity Tolerance  Patient tolerated treatment well    Behavior During Therapy  [facility] for tasks assessed/performed       Past Medical History:  Diagnosis Date  . Anxiety   . Aortic atherosclerosis ([place])   . BPH (benign prostatic hyperplasia)   . Chronic pain syndrome    neck and low back  . DDD (degenerative disc disease), cervical   . Diverticulosis   . DJD (degenerative joint disease)   . Ganglion cyst    12 mm , right posterior knee  . Hyperlipidemia   . Internal hemorrhoids   . Migraines   . OA (osteoarthritis)    knees, hands, right shoulder  . Positive QuantiFERON-TB Gold test   . Shoulder pain   . Tear of right rotator cuff   . Type 2 diabetes mellitus ([place])    last A1c 5.4 on 04-13-2018 in epic (followed by pcp)  . Wears glasses     Past Surgical History:  Procedure Laterality Date  . ANAL FISSURE REPAIR    . ARTHOSCOPIC ROTAOR CUFF REPAIR Right 06/15/2018   Procedure: RIGHT SHOULDER ARTHROSCOPY, DEBRIDEMENT, SUBACROMIAL DECOMPRESSION, DISTAL CLAVICLE RESECTION, ROTATOR CUFF REPAIR;  Surgeon: [name], [name];  Location: [place];  Service: Orthopedics;  Laterality: Right;  . COLONOSCOPY  05-26-2017   dr Henrene Pastor  . Hip Resurface  2006  . KNEE ARTHROSCOPY W/ MENISCAL REPAIR Bilateral right 1993; left 2010  .  MASS EXCISION Right 04/21/2018   Procedure: EXCISION RIGHT THIGH SOFT TISSUE MASS;  Surgeon: [name], [name];  [facility];  Service: Orthopedics;  Laterality: Right;  . SEPTOPLASTY    . SHOULDER ARTHROSCOPY WITH ROTATOR CUFF REPAIR Left   . SLAP Tear Repair  2008  . TONSILLECTOMY    . TOTAL KNEE ARTHROPLASTY Left 12/08/2016   Procedure: LEFT TOTAL KNEE ARTHROPLASTY;  Surgeon: [name], [name];  [facility];  Service: Orthopedics;  Laterality: Left;  . TOTAL KNEE ARTHROPLASTY WITH REVISION COMPONENTS Left 04/21/2018   Procedure: Left knee polyethylene exchange ;  Surgeon: [name], [name];  [facility];  Service: Orthopedics;  Laterality: Left;    There were no vitals filed for this visit.      [facility] PT Assessment - 10/20/18 0001      Assessment   Medical Diagnosis  Right RTC repair     Referring Provider (PT)  Dr [name]     Onset Date/Surgical Date  06/15/18    Next [name] Visit  1/27      AROM   Right Shoulder Flexion  134 Degrees    Right Shoulder ABduction  120 Degrees    Right Shoulder Internal Rotation  --   midline S1   Right  Shoulder External Rotation  --   Midline T4     PROM   Right Shoulder Flexion  146 Degrees    Right Shoulder ABduction  135 Degrees      Strength   Right Shoulder Flexion  4+/5    Right Shoulder Extension  5/5    Right Shoulder ABduction  4/5    Right Shoulder Internal Rotation  5/5    Right Shoulder External Rotation  4+/5                   [facility] Adult PT Treatment/Exercise - 10/20/18 0001      Shoulder Exercises: Standing   External Rotation Limitations  press into pilates ring +OH GHJ flx    Flexion Limitations  back to wall, active to avail and then assited for higher ranges to eccentric lower    Other Standing Exercises  wall walks yellow tband    Other Standing Exercises  ball roll up wall avoiding elbow lift      Manual Therapy   Soft tissue mobilization  IASTM pec minor/major    Passive ROM   ER, ABD- focusing on pec stretching               PT Short Term Goals - 08/20/18 1106      PT SHORT TERM GOAL #1   Title  pt will be I with initial HEP given     Status  Achieved      PT SHORT TERM GOAL #2   Title  pt will be able to verbalize and demo techniques to prevent/ reduce edema/ pain via RICE and HEP)    Status  Achieved      PT SHORT TERM GOAL #3   Title  pt will increase L knee flexion to >/= 110 degrees and extension to >/= -5 degrees for functional progression     Status  Partially Met      PT SHORT TERM GOAL #4   Title  pt will be able to stand/ ambulate >/= 15 min with </= 5/10 pain with LRAD to promote functional mobililty (01/12/2017    Status  Achieved      PT SHORT TERM GOAL #5   Title  Patient will increase passive right shoulder passive ER 40 percent per protocol     Status  Achieved        PT Long Term Goals - 10/20/18 1312      Additional Long Term Goals   Additional Long Term Goals  Yes      PT LONG TERM GOAL #6   Title  Patient will demsotrate 4+/5 grossright shoulder strength in order to perfrom ADL's     Baseline  see fowsheet    Status  On-going      PT LONG TERM GOAL #7   Title  Patient will demsotrate WNL active ER and IR on the right wihtout pain in order to perform ADL's     Baseline  WFL passively but limited actively for ADLs    Status  On-going      PT LONG TERM GOAL #8   Title  Pt will demo proper form with lifting, pushing and pulling as required for work    Baseline  limited to light weights at eval, lacks necessary strength/form for [facility]    Time  4    Period  Weeks    Status  New    Target Date  11/19/18  Plan - 10/20/18 1309    Clinical Impression Statement  Pt continues to make progress toward functional goals and demonstrates [facility] PROM with some tightness at end range as appropriate. Limited functional strength and endurance in highest OH ranges available. Pt will require [employer] overhead  and an improved strength for lifting/manipulating objects in order to be cleared to work at the end of the month. We will continue to address these deficits    PT Frequency  2x / week    PT Duration  4 weeks    PT Treatment/Interventions  Manual techniques;Patient/family education;Therapeutic activities;Therapeutic exercise;Cryotherapy;Vasopneumatic Device;Passive range of motion;Taping;Functional mobility training;Gait training;Stair training;Neuromuscular re-education;Moist Heat    PT Next Visit Plan  continue endurance & overhead challenges    PT Home Exercise Plan  updated 12/16: wall walk+liftoff, AROM in mirror, wall push up, uppertrap & levator & door pec stretch, abd HBB, IR behind back, supine horiz abd; w arms, SNAG, supine diagonals; OH wall walks tband, ball roll up wall, full FLX back to wall    Consulted and Agree with Plan of Care  Patient       Patient will benefit from skilled therapeutic intervention in order to improve the following deficits and impairments:  Pain, Decreased activity tolerance, Decreased range of motion, Decreased strength, Difficulty walking, Increased edema, Impaired UE functional use, Decreased endurance  Visit Diagnosis: Stiffness of right shoulder, not elsewhere classified - Plan: PT plan of care cert/re-cert  Acute pain of right shoulder - Plan: PT plan of care cert/re-cert     Problem List Patient Active Problem List   Diagnosis Date Noted  . Right shoulder injury 06/15/2018  . S/P arthroscopy of right shoulder 06/15/2018  . 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 OA (osteoarthritis) of knee 12/08/2016  . 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 Cervical facet joint syndrome 12/17/2015  . Chronic bilateral low back pain without sciatica 12/17/2015  . Primary osteoarthritis of left knee 12/17/2015    Britanie Harshman C. Benz Vandenberghe PT, DPT 10/20/18 1:20 PM   [facility] [address] [place], [place], [address] Phone: [phone]   Fax:  [phone]  Name: Daniel LOISEAU, [name] MRN: [id number] Date of Birth: 11-Sep-1961

## 2018-10-25 DIAGNOSIS — M545 Low back pain: Secondary | ICD-10-CM | POA: Diagnosis not present

## 2018-10-27 ENCOUNTER — Ambulatory Visit: Payer: 59 | Admitting: Physical Therapy

## 2018-10-27 ENCOUNTER — Encounter: Payer: Self-pay | Admitting: Physical Therapy

## 2018-10-27 DIAGNOSIS — M6281 Muscle weakness (generalized): Secondary | ICD-10-CM | POA: Diagnosis not present

## 2018-10-27 DIAGNOSIS — M25611 Stiffness of right shoulder, not elsewhere classified: Secondary | ICD-10-CM | POA: Diagnosis not present

## 2018-10-27 DIAGNOSIS — M25511 Pain in right shoulder: Secondary | ICD-10-CM | POA: Diagnosis not present

## 2018-10-27 DIAGNOSIS — M25662 Stiffness of left knee, not elsewhere classified: Secondary | ICD-10-CM | POA: Diagnosis not present

## 2018-10-27 NOTE — Therapy (Signed)
[place], [place], [address] Phone: [phone]   Fax:  [phone]  Physical Therapy Treatment  Patient Details  Name: Daniel KANAAN, [name] MRN: [id number] Date of Birth: 03/31/1961 Referring Provider (PT): Dr [name]    Encounter Date: 10/27/2018  PT End of Session - 10/27/18 1159    Visit Number  30    Number of Visits  19    Date for PT Re-Evaluation  11/19/18    Authorization Type   06/05/2352 recert for knee; 61/44/3154 for recert on shoulder     PT Start Time  1155   pt arrived late   PT Stop Time  1250    PT Time Calculation (min)  55 min    Activity Tolerance  Patient tolerated treatment well    Behavior During Therapy  [facility] for tasks assessed/performed       Past Medical History:  Diagnosis Date  . Anxiety   . Aortic atherosclerosis ([place])   . BPH (benign prostatic hyperplasia)   . Chronic pain syndrome    neck and low back  . DDD (degenerative disc disease), cervical   . Diverticulosis   . DJD (degenerative joint disease)   . Ganglion cyst    12 mm , right posterior knee  . Hyperlipidemia   . Internal hemorrhoids   . Migraines   . OA (osteoarthritis)    knees, hands, right shoulder  . Positive QuantiFERON-TB Gold test   . Shoulder pain   . Tear of right rotator cuff   . Type 2 diabetes mellitus ([place])    last A1c 5.4 on 04-13-2018 in epic (followed by pcp)  . Wears glasses     Past Surgical History:  Procedure Laterality Date  . ANAL FISSURE REPAIR    . ARTHOSCOPIC ROTAOR CUFF REPAIR Right 06/15/2018   Procedure: RIGHT SHOULDER ARTHROSCOPY, DEBRIDEMENT, SUBACROMIAL DECOMPRESSION, DISTAL CLAVICLE RESECTION, ROTATOR CUFF REPAIR;  Surgeon: [name], [name];  Location: [place];  Service: Orthopedics;  Laterality: Right;  . COLONOSCOPY  05-26-2017   dr Henrene Pastor  . Hip Resurface  2006  . KNEE ARTHROSCOPY W/ MENISCAL REPAIR Bilateral right 1993; left 2010  .  MASS EXCISION Right 04/21/2018   Procedure: EXCISION RIGHT THIGH SOFT TISSUE MASS;  Surgeon: [name], [name];  [facility];  Service: Orthopedics;  Laterality: Right;  . SEPTOPLASTY    . SHOULDER ARTHROSCOPY WITH ROTATOR CUFF REPAIR Left   . SLAP Tear Repair  2008  . TONSILLECTOMY    . TOTAL KNEE ARTHROPLASTY Left 12/08/2016   Procedure: LEFT TOTAL KNEE ARTHROPLASTY;  Surgeon: [name], [name];  [facility];  Service: Orthopedics;  Laterality: Left;  . TOTAL KNEE ARTHROPLASTY WITH REVISION COMPONENTS Left 04/21/2018   Procedure: Left knee polyethylene exchange ;  Surgeon: [name], [name];  [facility];  Service: Orthopedics;  Laterality: Left;    There were no vitals filed for this visit.  Subjective Assessment - 10/27/18 1157    Subjective  Neck still feels very stiff but shoulder is loosening up. Feels like it wants to pop but I just can't quite get it.     Patient Stated Goals  Retrun to normal     Currently in Pain?  Yes    Pain Score  --   was at a 5 before pain meds   Pain Location  Shoulder    Pain Orientation  Right    Pain Descriptors / Indicators  Sore  [place] Adult PT Treatment/Exercise - 10/27/18 0001      Shoulder Exercises: Standing   Flexion Limitations  wall walk + liftoff    Diagonals Limitations  green tband    Other Standing Exercises  bent over flx/horiz abd/ext    Other Standing Exercises  scap retraction, ABCs 2lb, wall push ups      Shoulder Exercises: Stretch   Internal Rotation Stretch Limitations  strap behind back 5x10s    Wall Stretch - ABduction Limitations  door pec stretch    Other Shoulder Stretches  rotation SNAG    Other Shoulder Stretches  upper trap stretch      Modalities   Modalities  Cryotherapy      Cryotherapy   Number Minutes Cryotherapy  10 Minutes    Cryotherapy Location  Shoulder    Type of Cryotherapy  Ice pack               PT Short Term Goals - 08/20/18 1106       PT SHORT TERM GOAL #1   Title  pt will be I with initial HEP given     Status  Achieved      PT SHORT TERM GOAL #2   Title  pt will be able to verbalize and demo techniques to prevent/ reduce edema/ pain via RICE and HEP)    Status  Achieved      PT SHORT TERM GOAL #3   Title  pt will increase L knee flexion to >/= 110 degrees and extension to >/= -5 degrees for functional progression     Status  Partially Met      PT SHORT TERM GOAL #4   Title  pt will be able to stand/ ambulate >/= 15 min with </= 5/10 pain with LRAD to promote functional mobililty (01/12/2017    Status  Achieved      PT SHORT TERM GOAL #5   Title  Patient will increase passive right shoulder passive ER 40 percent per protocol     Status  Achieved        PT Long Term Goals - 10/20/18 1312      Additional Long Term Goals   Additional Long Term Goals  Yes      PT LONG TERM GOAL #6   Title  Patient will demsotrate 4+/5 grossright shoulder strength in order to perfrom ADL's     Baseline  see fowsheet    Status  On-going      PT LONG TERM GOAL #7   Title  Patient will demsotrate WNL active ER and IR on the right wihtout pain in order to perform ADL's     Baseline  WFL passively but limited actively for ADLs    Status  On-going      PT LONG TERM GOAL #8   Title  Pt will demo proper form with lifting, pushing and pulling as required for work    Baseline  limited to light weights at eval, lacks necessary strength/form for [facility]    Time  4    Period  Weeks    Status  New    Target Date  11/19/18            Plan - 10/27/18 2030    Clinical Impression Statement  Updated and progressed HEP today to challenge increasing strength. End range flexion limited actively and compensation pattern of elbow flexion and shoulder hike. Fatigue noted but good scapular rythym.     PT Treatment/Interventions  Manual techniques;Patient/family education;Therapeutic activities;Therapeutic exercise;Cryotherapy;Vasopneumatic  Device;Passive range of motion;Taping;Functional mobility training;Gait training;Stair training;Neuromuscular re-education;Moist Heat    PT Next Visit Plan  continue endurance & overhead challenges    PT Home Exercise Plan  updated 1/15: scap retraction, upper trap stretch, door pec stretch, IR stretch, cervical retraction SNAG, wall push up, tband diagonals, standing alphabet, bent over flx/abd/ext, wall flx+liftoff    Consulted and Agree with Plan of Care  Patient       Patient will benefit from skilled therapeutic intervention in order to improve the following deficits and impairments:  Pain, Decreased activity tolerance, Decreased range of motion, Decreased strength, Difficulty walking, Increased edema, Impaired UE functional use, Decreased endurance  Visit Diagnosis: Stiffness of right shoulder, not elsewhere classified  Acute pain of right shoulder     Problem List Patient Active Problem List   Diagnosis Date Noted  . Right shoulder injury 06/15/2018  . S/P arthroscopy of right shoulder 06/15/2018  . Failed total knee arthroplasty ([place]) 04/21/2018  . OA (osteoarthritis) of knee 12/08/2016  . 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 Cervical facet joint syndrome 12/17/2015  . Chronic bilateral low back pain without sciatica 12/17/2015  . Primary osteoarthritis of left knee 12/17/2015    Terah Robey C. Wyatte Dames PT, DPT 10/27/18 8:45 PM   [facility] [address] [place], [place], [address] Phone: [phone]   Fax:  [phone]  Name: Daniel BURES, [name] MRN: [id number] Date of Birth: 02/21/1961

## 2018-10-28 DIAGNOSIS — Z471 Aftercare following joint replacement surgery: Secondary | ICD-10-CM | POA: Diagnosis not present

## 2018-10-28 DIAGNOSIS — Z96652 Presence of left artificial knee joint: Secondary | ICD-10-CM | POA: Diagnosis not present

## 2018-11-01 ENCOUNTER — Encounter: Payer: Self-pay | Admitting: Physical Therapy

## 2018-11-01 ENCOUNTER — Ambulatory Visit: Payer: 59 | Admitting: Physical Therapy

## 2018-11-01 DIAGNOSIS — M25611 Stiffness of right shoulder, not elsewhere classified: Secondary | ICD-10-CM | POA: Diagnosis not present

## 2018-11-01 DIAGNOSIS — M25662 Stiffness of left knee, not elsewhere classified: Secondary | ICD-10-CM | POA: Diagnosis not present

## 2018-11-01 DIAGNOSIS — M25511 Pain in right shoulder: Secondary | ICD-10-CM

## 2018-11-01 DIAGNOSIS — M47812 Spondylosis without myelopathy or radiculopathy, cervical region: Secondary | ICD-10-CM | POA: Diagnosis not present

## 2018-11-01 DIAGNOSIS — M6281 Muscle weakness (generalized): Secondary | ICD-10-CM | POA: Diagnosis not present

## 2018-11-01 NOTE — Therapy (Signed)
[place], [place], [address] Phone: [phone]   Fax:  [phone]  Physical Therapy Treatment  Patient Details  Name: Daniel SPALLA, MD MRN: [id number] Date of Birth: [birth year] Referring Provider (PT): Dr [name]    Encounter Date: 11/01/2018  PT End of Session - 11/01/18 1231    Visit Number  31    Number of Visits  56    Date for PT Re-Evaluation  11/19/18    Authorization Type   9/38/1017 recert for knee; 51/11/5850 for recert on shoulder     PT Start Time  1107   pt arrived late   PT Stop Time  1200    PT Time Calculation (min)  53 min    Activity Tolerance  Patient tolerated treatment well    Behavior During Therapy  [facility] for tasks assessed/performed       Past Medical History:  Diagnosis Date  . Anxiety   . Aortic atherosclerosis ([place])   . BPH (benign prostatic hyperplasia)   . Chronic pain syndrome    neck and low back  . DDD (degenerative disc disease), cervical   . Diverticulosis   . DJD (degenerative joint disease)   . Ganglion cyst    12 mm , right posterior knee  . Hyperlipidemia   . Internal hemorrhoids   . Migraines   . OA (osteoarthritis)    knees, hands, right shoulder  . Positive QuantiFERON-TB Gold test   . Shoulder pain   . Tear of right rotator cuff   . Type 2 diabetes mellitus ([place])    last A1c 5.4 on 04-13-2018 in epic (followed by pcp)  . Wears glasses     Past Surgical History:  Procedure Laterality Date  . ANAL FISSURE REPAIR    . ARTHOSCOPIC ROTAOR CUFF REPAIR Right 06/15/2018   Procedure: RIGHT SHOULDER ARTHROSCOPY, DEBRIDEMENT, SUBACROMIAL DECOMPRESSION, DISTAL CLAVICLE RESECTION, ROTATOR CUFF REPAIR;  Surgeon: [name], MD;  Location: [place];  Service: Orthopedics;  Laterality: Right;  . COLONOSCOPY  05-26-2017   dr Henrene Pastor  . Hip Resurface  2006  . KNEE ARTHROSCOPY W/ MENISCAL REPAIR Bilateral right 1993; left 2010  .  MASS EXCISION Right 04/21/2018   Procedure: EXCISION RIGHT THIGH SOFT TISSUE MASS;  Surgeon: [name], MD;  [facility];  Service: Orthopedics;  Laterality: Right;  . SEPTOPLASTY    . SHOULDER ARTHROSCOPY WITH ROTATOR CUFF REPAIR Left   . SLAP Tear Repair  2008  . TONSILLECTOMY    . 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 TOTAL KNEE ARTHROPLASTY WITH REVISION COMPONENTS Left 04/21/2018   Procedure: Left knee polyethylene exchange ;  Surgeon: [name], MD;  [facility];  Service: Orthopedics;  Laterality: Left;    There were no vitals filed for this visit.  Subjective Assessment - 11/01/18 1109    Subjective  Went and got a massage yesterday. Pecs are still a little tight but better. Improved grip and decr swelling in wrist/hand. Still get some discomfort at 3rd met if I grab things wrong.     Currently in Pain?  No/denies                       [facility] Adult PT Treatment/Exercise - 11/01/18 0001      Shoulder Exercises: Prone  External Rotation Limitations  retraction+ext 1lb x15    Horizontal ABduction 1 Limitations  retraction+horiz abd low diag 1 lb    Horizontal ABduction 2 Limitations  1lb elbow at 90      Shoulder Exercises: Standing   External Rotation Limitations  snow angels small blue ball    ABduction  10 reps   2 sets   ABduction Limitations  yellow tband      Shoulder Exercises: ROM/Strengthening   UBE (Upper Arm Bike)  2/2 L4      Shoulder Exercises: Stretch   Wall Stretch - ABduction Limitations  high door stretch      Shoulder Exercises: Body Blade   External Rotation Limitations  4*30s      Vasopneumatic   Number Minutes Vasopneumatic   15 minutes    Vasopnuematic Location   Shoulder    Vasopneumatic Pressure  Low    Vasopneumatic Temperature   lowest               PT Short  Term Goals - 08/20/18 1106      PT SHORT TERM GOAL #1   Title  pt will be I with initial HEP given     Status  Achieved      PT SHORT TERM GOAL #2   Title  pt will be able to verbalize and demo techniques to prevent/ reduce edema/ pain via RICE and HEP)    Status  Achieved      PT SHORT TERM GOAL #3   Title  pt will increase L knee flexion to >/= 110 degrees and extension to >/= -5 degrees for functional progression     Status  Partially Met      PT SHORT TERM GOAL #4   Title  pt will be able to stand/ ambulate >/= 15 min with </= 5/10 pain with LRAD to promote functional mobililty (01/12/2017    Status  Achieved      PT SHORT TERM GOAL #5   Title  Patient will increase passive right shoulder passive ER 40 percent per protocol     Status  Achieved        PT Long Term Goals - 10/20/18 1312      Additional Long Term Goals   Additional Long Term Goals  Yes      PT LONG TERM GOAL #6   Title  Patient will demsotrate 4+/5 grossright shoulder strength in order to perfrom ADL's     Baseline  see fowsheet    Status  On-going      PT LONG TERM GOAL #7   Title  Patient will demsotrate WNL active ER and IR on the right wihtout pain in order to perform ADL's     Baseline  WFL passively but limited actively for ADLs    Status  On-going      PT LONG TERM GOAL #8   Title  Pt will demo proper form with lifting, pushing and pulling as required for work    Baseline  limited to light weights at eval, lacks necessary strength/form for [facility]    Time  4    Period  Weeks    Status  New    Target Date  11/19/18            Plan - 11/01/18 1232    Clinical Impression Statement  challenging post deltoid and lower trap to increase resistance to pectoralis during daily activities. aval ER toperform snow angels on wall is <45  deg. returning to work on 2/4.     PT Treatment/Interventions  Manual techniques;Patient/family education;Therapeutic activities;Therapeutic  exercise;Cryotherapy;Vasopneumatic Device;Passive range of motion;Taping;Functional mobility training;Gait training;Stair training;Neuromuscular re-education;Moist Heat    PT Next Visit Plan  post chain strength, OH endurance, review any work requirements    PT Home Exercise Plan  updated 1/15: scap retraction, upper trap stretch high, door pec stretch, IR stretch, cervical retraction SNAG, wall push up, tband diagonals, standing alphabet, bent over flx/abd/ext, wall flx+liftof    Consulted and Agree with Plan of Care  Patient       Patient will benefit from skilled therapeutic intervention in order to improve the following deficits and impairments:  Pain, Decreased activity tolerance, Decreased range of motion, Decreased strength, Difficulty walking, Increased edema, Impaired UE functional use, Decreased endurance  Visit Diagnosis: Stiffness of right shoulder, not elsewhere classified  Acute pain of right shoulder     Problem List Patient Active Problem List   Diagnosis Date Noted  . Right shoulder injury 06/15/2018  . S/P arthroscopy of right shoulder 06/15/2018  . Failed total knee arthroplasty ([place]) 04/21/2018  . OA (osteoarthritis) of knee 12/08/2016  . 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 Cervical facet joint syndrome 12/17/2015  . Chronic bilateral low back pain without sciatica 12/17/2015  . Primary osteoarthritis of left knee 12/17/2015    Cedrick Partain C. Cicely Ortner PT, DPT 11/01/18 12:39 PM   [place] [facility] [address] [place], [place], [address] Phone: [phone]   Fax:  [phone]  Name: ARIES TOWNLEY, MD MRN: [id number] Date of Birth: [birth year]

## 2018-11-02 MED FILL — ISONIAZID 300 MG TABLET: 300 | 30 days supply | Qty: 30 | Fill #3

## 2018-11-02 MED FILL — diazePAM 5 MG TABS: 5 | 30 days supply | Qty: 60 | Fill #0

## 2018-11-02 MED FILL — RIZATRIPTAN 5 MG ODT: 5 | 30 days supply | Qty: 15 | Fill #1

## 2018-11-03 ENCOUNTER — Encounter: Payer: Self-pay | Admitting: Physical Therapy

## 2018-11-03 ENCOUNTER — Ambulatory Visit: Payer: 59 | Admitting: Physical Therapy

## 2018-11-03 DIAGNOSIS — M25511 Pain in right shoulder: Secondary | ICD-10-CM | POA: Diagnosis not present

## 2018-11-03 DIAGNOSIS — M25611 Stiffness of right shoulder, not elsewhere classified: Secondary | ICD-10-CM | POA: Diagnosis not present

## 2018-11-03 DIAGNOSIS — M6281 Muscle weakness (generalized): Secondary | ICD-10-CM | POA: Diagnosis not present

## 2018-11-03 DIAGNOSIS — M25662 Stiffness of left knee, not elsewhere classified: Secondary | ICD-10-CM | POA: Diagnosis not present

## 2018-11-03 NOTE — Therapy (Signed)
[place], [place], [address] Phone: [phone]   Fax:  [phone]  Physical Therapy Treatment  Patient Details  Name: Daniel SELVIDGE, [name] MRN: [id number] Date of Birth: [birth year] Referring Provider (PT): Dr [name]    Encounter Date: 11/03/2018  PT End of Session - 11/03/18 1212    Visit Number  32    Number of Visits  51    Date for PT Re-Evaluation  11/19/18    Authorization Type   5/69/7948 recert for knee; 01/65/5374 for recert on shoulder     PT Start Time  1211   pt arrived late   PT Stop Time  1259    PT Time Calculation (min)  48 min    Activity Tolerance  Patient tolerated treatment well    Behavior During Therapy  [facility] for tasks assessed/performed       Past Medical History:  Diagnosis Date  . Anxiety   . Aortic atherosclerosis ([place])   . BPH (benign prostatic hyperplasia)   . Chronic pain syndrome    neck and low back  . DDD (degenerative disc disease), cervical   . Diverticulosis   . DJD (degenerative joint disease)   . Ganglion cyst    12 mm , right posterior knee  . Hyperlipidemia   . Internal hemorrhoids   . Migraines   . OA (osteoarthritis)    knees, hands, right shoulder  . Positive QuantiFERON-TB Gold test   . Shoulder pain   . Tear of right rotator cuff   . Type 2 diabetes mellitus ([place])    last A1c 5.4 on 04-13-2018 in epic (followed by pcp)  . Wears glasses     Past Surgical History:  Procedure Laterality Date  . ANAL FISSURE REPAIR    . ARTHOSCOPIC ROTAOR CUFF REPAIR Right 06/15/2018   Procedure: RIGHT SHOULDER ARTHROSCOPY, DEBRIDEMENT, SUBACROMIAL DECOMPRESSION, DISTAL CLAVICLE RESECTION, ROTATOR CUFF REPAIR;  Surgeon: [name], [name];  Location: [place];  Service: Orthopedics;  Laterality: Right;  . COLONOSCOPY  05-26-2017   dr Henrene Pastor  . Hip Resurface  2006  . KNEE ARTHROSCOPY W/ MENISCAL REPAIR Bilateral right 1993; left 2010  .  MASS EXCISION Right 04/21/2018   Procedure: EXCISION RIGHT THIGH SOFT TISSUE MASS;  Surgeon: [name], [name];  [facility];  Service: Orthopedics;  Laterality: Right;  . SEPTOPLASTY    . SHOULDER ARTHROSCOPY WITH ROTATOR CUFF REPAIR Left   . SLAP Tear Repair  2008  . TONSILLECTOMY    . TOTAL KNEE ARTHROPLASTY Left 12/08/2016   Procedure: LEFT TOTAL KNEE ARTHROPLASTY;  Surgeon: [name], [name];  [facility];  Service: Orthopedics;  Laterality: Left;  . TOTAL KNEE ARTHROPLASTY WITH REVISION COMPONENTS Left 04/21/2018   Procedure: Left knee polyethylene exchange ;  Surgeon: [name], [name];  [facility];  Service: Orthopedics;  Laterality: Left;    There were no vitals filed for this visit.  Subjective Assessment - 11/03/18 1212    Subjective  neck and lower back are spasming, started last night.     Currently in Pain?  Yes    Pain Location  Neck    Pain Orientation  Right    Pain Descriptors / Indicators  Spasm                       [facility] Adult PT Treatment/Exercise - 11/03/18 0001      Shoulder Exercises: ROM/Strengthening   Lat  Pull Limitations  10 lb, standing rather than seated    Cybex Row Limitations  10 lb, wide & narrow      Vasopneumatic   Number Minutes Vasopneumatic   15 minutes    Vasopnuematic Location   Shoulder    Vasopneumatic Pressure  Low    Vasopneumatic Temperature   lowest      Manual Therapy   Joint Mobilization  Lower cervical Rt to Lt    Soft tissue mobilization  Rt upper trap, scalenes; Rt QL               PT Short Term Goals - 08/20/18 1106      PT SHORT TERM GOAL #1   Title  pt will be I with initial HEP given     Status  Achieved      PT SHORT TERM GOAL #2   Title  pt will be able to verbalize and demo techniques to prevent/ reduce edema/ pain via RICE and HEP)    Status  Achieved      PT SHORT TERM GOAL #3   Title  pt will increase L knee flexion to >/= 110 degrees and extension to >/= -5 degrees  for functional progression     Status  Partially Met      PT SHORT TERM GOAL #4   Title  pt will be able to stand/ ambulate >/= 15 min with </= 5/10 pain with LRAD to promote functional mobililty (01/12/2017    Status  Achieved      PT SHORT TERM GOAL #5   Title  Patient will increase passive right shoulder passive ER 40 percent per protocol     Status  Achieved        PT Long Term Goals - 10/20/18 1312      Additional Long Term Goals   Additional Long Term Goals  Yes      PT LONG TERM GOAL #6   Title  Patient will demsotrate 4+/5 grossright shoulder strength in order to perfrom ADL's     Baseline  see fowsheet    Status  On-going      PT LONG TERM GOAL #7   Title  Patient will demsotrate WNL active ER and IR on the right wihtout pain in order to perform ADL's     Baseline  WFL passively but limited actively for ADLs    Status  On-going      PT LONG TERM GOAL #8   Title  Pt will demo proper form with lifting, pushing and pulling as required for work    Baseline  limited to light weights at eval, lacks necessary strength/form for [facility]    Time  4    Period  Weeks    Status  New    Target Date  11/19/18            Plan - 11/03/18 1300    Clinical Impression Statement  reviewed use of gym equipment and asked him to continue avoiding chest press and bench press to avoid further tightening pecs. Limited Rt to Lt cervical mobility at lower cervical region creating tightness in upper trap.     PT Treatment/Interventions  Manual techniques;Patient/family education;Therapeutic activities;Therapeutic exercise;Cryotherapy;Vasopneumatic Device;Passive range of motion;Taping;Functional mobility training;Gait training;Stair training;Neuromuscular re-education;Moist Heat    PT Next Visit Plan  post chain strength, OH endurance, review any work requirements    PT Home Exercise Plan  updated 1/15: scap retraction, upper trap stretch high, door pec stretch,  IR stretch, cervical retraction  SNAG, wall push up, tband diagonals, standing alphabet, bent over flx/abd/ext, wall flx+liftof    Consulted and Agree with Plan of Care  Patient       Patient will benefit from skilled therapeutic intervention in order to improve the following deficits and impairments:  Pain, Decreased activity tolerance, Decreased range of motion, Decreased strength, Difficulty walking, Increased edema, Impaired UE functional use, Decreased endurance  Visit Diagnosis: Stiffness of right shoulder, not elsewhere classified  Acute pain of right shoulder     Problem List Patient Active Problem List   Diagnosis Date Noted  . Right shoulder injury 06/15/2018  . S/P arthroscopy of right shoulder 06/15/2018  . 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 OA (osteoarthritis) of knee 12/08/2016  . 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 Cervical facet joint syndrome 12/17/2015  . Chronic bilateral low back pain without sciatica 12/17/2015  . Primary osteoarthritis of left knee 12/17/2015    Siriah Treat C. Shereka Lafortune PT, DPT 11/03/18 1:07 PM   [facility] [address] [place], [place], [address] Phone: [phone]   Fax:  [phone]  Name: Daniel GIRAUD, [name] MRN: [id number] Date of Birth: 1960-12-15

## 2018-11-04 ENCOUNTER — Encounter: Payer: 59 | Attending: Physical Medicine & Rehabilitation

## 2018-11-04 ENCOUNTER — Encounter: Payer: Self-pay | Admitting: Physical Medicine & Rehabilitation

## 2018-11-04 ENCOUNTER — Ambulatory Visit (HOSPITAL_BASED_OUTPATIENT_CLINIC_OR_DEPARTMENT_OTHER): Payer: 59 | Admitting: Physical Medicine & Rehabilitation

## 2018-11-04 VITALS — BP 127/83 | HR 97 | Ht 72.0 in | Wt 305.0 lb

## 2018-11-04 DIAGNOSIS — M25511 Pain in right shoulder: Secondary | ICD-10-CM | POA: Diagnosis not present

## 2018-11-04 DIAGNOSIS — M25662 Stiffness of left knee, not elsewhere classified: Secondary | ICD-10-CM | POA: Diagnosis not present

## 2018-11-04 DIAGNOSIS — M6281 Muscle weakness (generalized): Secondary | ICD-10-CM | POA: Insufficient documentation

## 2018-11-04 DIAGNOSIS — E119 Type 2 diabetes mellitus without complications: Secondary | ICD-10-CM | POA: Insufficient documentation

## 2018-11-04 DIAGNOSIS — Z96652 Presence of left artificial knee joint: Secondary | ICD-10-CM | POA: Diagnosis not present

## 2018-11-04 DIAGNOSIS — M25611 Stiffness of right shoulder, not elsewhere classified: Secondary | ICD-10-CM | POA: Insufficient documentation

## 2018-11-04 DIAGNOSIS — G43719 Chronic migraine without aura, intractable, without status migrainosus: Secondary | ICD-10-CM | POA: Insufficient documentation

## 2018-11-04 DIAGNOSIS — I700 Atherosclerosis of aorta: Secondary | ICD-10-CM | POA: Insufficient documentation

## 2018-11-04 NOTE — Progress Notes (Signed)
Botox injection for chronic migraine prophylaxis.  Indication: History of migraines with greater than 15 headaches per month despite trials of oral medications.  Informed consent was obtained after discussing risks and benefits of the procedure with the patient this included bleeding bruising infection as well as facial drooping, eyelid lag, systemic effects of Botox. Patient elects to proceed and has given written consent.  Last Botox May 2019- headache frequency increased  Patient placed in a seated position Dilution 50 units per cc  Muscles injected with dose  Procerus 5 units Corrugator 5 units bilateral Frontalis 5 units 2 injection sites on the right and 2 injection sites on the left Temporalis 5 units in 4 injection sites on the right and 4 injection sites on the left Occipitalis 5 units into 3 injections sites on the right and 3 injection sites on the left  Cervical paraspinals 5 units into 2 injection sites on the right and 2 injection sites on the left trapezius 5 units into 3 injection sites on the right and 3 injection sites on the left bilateral levator scapula, 5 units bilaterally Patient tolerated procedure well. Post procedure instructions given. Appointment for repeat injection , pt will call as he gets >6 mo relief from treatment  Wasted 35U

## 2018-11-04 NOTE — Patient Instructions (Signed)
You received a Botox injection today. You may experience soreness at the needle injection sites. Please call us if any of the injection sites turns red after a couple days or if there is any drainage. You may experience muscle weakness as a result of Botox. This would improve with time but can take several weeks to improve. The Botox should start working in about one week. The Botox usually last 3 months. The injection can be repeated every 3 months as needed.Erenumab injection What is this medicine? ERENUMAB (e REN ue mab) is used to prevent migraine headaches. This medicine may be used for other purposes; ask your health care provider or pharmacist if you have questions. COMMON BRAND NAME(S): Aimovig What should I tell my health care provider before I take this medicine? They need to know if you have any of these conditions: -an unusual or allergic reaction to erenumab, latex, other medicines, foods, dyes, or preservatives -pregnant or trying to get pregnant -breast-feeding How should I use this medicine? This medicine is for injection under the skin. You will be taught how to prepare and give this medicine. Use exactly as directed. Take your medicine at regular intervals. Do not take your medicine more often than directed. It is important that you put your used needles and syringes in a special sharps container. Do not put them in a trash can. If you do not have a sharps container, call your pharmacist or healthcare provider to get one. Talk to your pediatrician regarding the use of this medicine in children. Special care may be needed. Overdosage: If you think you have taken too much of this medicine contact a poison control center or emergency room at once. NOTE: This medicine is only for you. Do not share this medicine with others. What if I miss a dose? If you miss a dose, take it as soon as you can. If it is almost time for your next dose, take only that dose. Do not take double or extra  doses. What may interact with this medicine? Interactions are not expected. This list may not describe all possible interactions. Give your health care provider a list of all the medicines, herbs, non-prescription drugs, or dietary supplements you use. Also tell them if you smoke, drink alcohol, or use illegal drugs. Some items may interact with your medicine. What should I watch for while using this medicine? Tell your doctor or healthcare professional if your symptoms do not start to get better or if they get worse. What side effects may I notice from receiving this medicine? Side effects that you should report to your doctor or health care professional as soon as possible: -allergic reactions like skin rash, itching or hives, swelling of the face, lips, or tongue Side effects that usually do not require medical attention (report these to your doctor or health care professional if they continue or are bothersome): -constipation -muscle cramps -pain, redness, or irritation at site where injected This list may not describe all possible side effects. Call your doctor for medical advice about side effects. You may report side effects to FDA at 1-800-FDA-1088. Where should I keep my medicine? Keep out of the reach of children. You will be instructed on how to store this medicine. Throw away any unused medicine after the expiration date on the label. NOTE: This sheet is a summary. It may not cover all possible information. If you have questions about this medicine, talk to your doctor, pharmacist, or health care provider.  2019 Elsevier/Gold  Standard (2017-03-02 09:39:04)

## 2018-11-08 ENCOUNTER — Ambulatory Visit: Payer: 59 | Admitting: Physical Therapy

## 2018-11-08 DIAGNOSIS — M25511 Pain in right shoulder: Secondary | ICD-10-CM | POA: Diagnosis not present

## 2018-11-08 DIAGNOSIS — Z5189 Encounter for other specified aftercare: Secondary | ICD-10-CM | POA: Diagnosis not present

## 2018-11-08 DIAGNOSIS — Z9889 Other specified postprocedural states: Secondary | ICD-10-CM | POA: Diagnosis not present

## 2018-11-09 DIAGNOSIS — M9901 Segmental and somatic dysfunction of cervical region: Secondary | ICD-10-CM | POA: Diagnosis not present

## 2018-11-09 DIAGNOSIS — M545 Low back pain: Secondary | ICD-10-CM | POA: Diagnosis not present

## 2018-11-09 DIAGNOSIS — M542 Cervicalgia: Secondary | ICD-10-CM | POA: Diagnosis not present

## 2018-11-09 DIAGNOSIS — M546 Pain in thoracic spine: Secondary | ICD-10-CM | POA: Diagnosis not present

## 2018-11-10 ENCOUNTER — Encounter: Payer: Self-pay | Admitting: Physical Therapy

## 2018-11-10 ENCOUNTER — Ambulatory Visit: Payer: 59 | Admitting: Physical Therapy

## 2018-11-10 DIAGNOSIS — M25511 Pain in right shoulder: Secondary | ICD-10-CM | POA: Diagnosis not present

## 2018-11-10 DIAGNOSIS — M25662 Stiffness of left knee, not elsewhere classified: Secondary | ICD-10-CM | POA: Diagnosis not present

## 2018-11-10 DIAGNOSIS — M25611 Stiffness of right shoulder, not elsewhere classified: Secondary | ICD-10-CM

## 2018-11-10 DIAGNOSIS — M6281 Muscle weakness (generalized): Secondary | ICD-10-CM | POA: Diagnosis not present

## 2018-11-10 NOTE — Therapy (Signed)
[name], [place], [address] Phone: [phone]   Fax:  [phone]  Physical Therapy Treatment  Patient Details  Name: Daniel BOGNAR, MD MRN: [id number] Date of Birth: 1960-11-07 Referring Provider (PT): Dr [name]    Encounter Date: 11/10/2018  PT End of Session - 11/10/18 1216    Visit Number  33    Number of Visits  54    Date for PT Re-Evaluation  11/19/18    Authorization Type   8/34/1962 recert for knee; 22/97/9892 for recert on shoulder     PT Start Time  1145    PT Stop Time  1245    PT Time Calculation (min)  60 min       Past Medical History:  Diagnosis Date  . Anxiety   . Aortic atherosclerosis ([place])   . BPH (benign prostatic hyperplasia)   . Chronic pain syndrome    neck and low back  . DDD (degenerative disc disease), cervical   . Diverticulosis   . DJD (degenerative joint disease)   . Ganglion cyst    12 mm , right posterior knee  . Hyperlipidemia   . Internal hemorrhoids   . Migraines   . OA (osteoarthritis)    knees, hands, right shoulder  . Positive QuantiFERON-TB Gold test   . Shoulder pain   . Tear of right rotator cuff   . Type 2 diabetes mellitus ([place])    last A1c 5.4 on 04-13-2018 in epic (followed by pcp)  . Wears glasses     Past Surgical History:  Procedure Laterality Date  . ANAL FISSURE REPAIR    . ARTHOSCOPIC ROTAOR CUFF REPAIR Right 06/15/2018   Procedure: RIGHT SHOULDER ARTHROSCOPY, DEBRIDEMENT, SUBACROMIAL DECOMPRESSION, DISTAL CLAVICLE RESECTION, ROTATOR CUFF REPAIR;  Surgeon: Daniel Cabal, MD;  Location: [facility];  Service: Orthopedics;  Laterality: Right;  . COLONOSCOPY  05-26-2017   dr Henrene Pastor  . Hip Resurface  2006  . KNEE ARTHROSCOPY W/ MENISCAL REPAIR Bilateral right 1993; left 2010  . MASS EXCISION Right 04/21/2018   Procedure: EXCISION RIGHT THIGH SOFT TISSUE MASS;  Surgeon: Daniel Arabian, MD;  Location: [facility];   Service: Orthopedics;  Laterality: Right;  . SEPTOPLASTY    . SHOULDER ARTHROSCOPY WITH ROTATOR CUFF REPAIR Left   . SLAP Tear Repair  2008  . TONSILLECTOMY    . TOTAL KNEE ARTHROPLASTY Left 12/08/2016   Procedure: LEFT TOTAL KNEE ARTHROPLASTY;  Surgeon: Daniel Arabian, MD;  Location: [facility];  Service: Orthopedics;  Laterality: Left;  . TOTAL KNEE ARTHROPLASTY WITH REVISION COMPONENTS Left 04/21/2018   Procedure: Left knee polyethylene exchange ;  Surgeon: Daniel Arabian, MD;  Location: [facility];  Service: Orthopedics;  Laterality: Left;    There were no vitals filed for this visit.                    [facility] Adult PT Treatment/Exercise - 11/10/18 0001      Shoulder Exercises: Standing   Flexion  10 reps    Theraband Level (Shoulder Flexion)  Level 1 (Yellow)    Flexion Limitations  wall walk + liftoff    ABduction  10 reps   2 sets   ABduction Limitations  yellow tband    Other Standing Exercises  bent over flx/horiz abd/ext 2 x 10    2#   Other Standing Exercises  scap retraction, ABCs 2lb (A-F) then removed weight for remaining letters, wall push ups  x 20       Shoulder Exercises: ROM/Strengthening   UBE (Upper Arm Bike)  2/2 L4    Lat Pull Limitations  15 lb, standing rather than seated    Cybex Row Limitations  15lb low and mid      Shoulder Exercises: Body Blade   Flexion Limitations  perpendicular to body , low , 2 x30 s    ABduction Limitations  at side 2 x 30 s     External Rotation Limitations  4*30s      Vasopneumatic   Number Minutes Vasopneumatic   15 minutes    Vasopnuematic Location   Shoulder    Vasopneumatic Pressure  Low    Vasopneumatic Temperature   lowest      Manual Therapy   Soft tissue mobilization  Rt upper trap, levator; Rt QL               PT Short Term Goals - 08/20/18 1106      PT SHORT TERM GOAL #1   Title  pt will be I with initial HEP given     Status  Achieved      PT SHORT TERM GOAL #2   Title  pt will be able  to verbalize and demo techniques to prevent/ reduce edema/ pain via RICE and HEP)    Status  Achieved      PT SHORT TERM GOAL #3   Title  pt will increase L knee flexion to >/= 110 degrees and extension to >/= -5 degrees for functional progression     Status  Partially Met      PT SHORT TERM GOAL #4   Title  pt will be able to stand/ ambulate >/= 15 min with </= 5/10 pain with LRAD to promote functional mobililty (01/12/2017    Status  Achieved      PT SHORT TERM GOAL #5   Title  Patient will increase passive right shoulder passive ER 40 percent per protocol     Status  Achieved        PT Long Term Goals - 10/20/18 1312      Additional Long Term Goals   Additional Long Term Goals  Yes      PT LONG TERM GOAL #6   Title  Patient will demsotrate 4+/5 grossright shoulder strength in order to perfrom ADL's     Baseline  see fowsheet    Status  On-going      PT LONG TERM GOAL #7   Title  Patient will demsotrate WNL active ER and IR on the right wihtout pain in order to perform ADL's     Baseline  WFL passively but limited actively for ADLs    Status  On-going      PT LONG TERM GOAL #8   Title  Pt will demo proper form with lifting, pushing and pulling as required for work    Baseline  limited to light weights at eval, lacks necessary strength/form for [facility]    Time  4    Period  Weeks    Status  New    Target Date  11/19/18            Plan - 11/10/18 1305    Clinical Impression Statement  continued gym equipment with increased weight, progressed body blade stabilizaton  and added 2# to bent over exerise series . Pt fatigued with 2# ABC endurance exercise and required frequent rest breaks however wanted to continue. IASTM and  STW performed to Rt upper trap and Right QL prior to Vaso.     PT Next Visit Plan  post chain strength, OH endurance, review any work requirements    PT Home Exercise Plan  updated 1/15: scap retraction, upper trap stretch high, door pec stretch, IR  stretch, cervical retraction SNAG, wall push up, tband diagonals, standing alphabet, bent over flx/abd/ext, wall flx+liftof    Consulted and Agree with Plan of Care  Patient       Patient will benefit from skilled therapeutic intervention in order to improve the following deficits and impairments:  Pain, Decreased activity tolerance, Decreased range of motion, Decreased strength, Difficulty walking, Increased edema, Impaired UE functional use, Decreased endurance  Visit Diagnosis: Stiffness of right shoulder, not elsewhere classified  Acute pain of right shoulder  Stiffness of left knee, not elsewhere classified  Muscle weakness (generalized)     Problem List Patient Active Problem List   Diagnosis Date Noted  . Right shoulder injury 06/15/2018  . S/P arthroscopy of right shoulder 06/15/2018  . Failed total knee arthroplasty ([place]) 04/21/2018  . OA (osteoarthritis) of knee 12/08/2016  . 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 Cervical facet joint syndrome 12/17/2015  . Chronic bilateral low back pain without sciatica 12/17/2015  . Primary osteoarthritis of left knee 12/17/2015    Dorene Ar, PTA 11/10/2018, 1:10 PM  [facility] [address] [place], [place], [address] Phone: [phone]   Fax:  [phone]  Name: TRAMON CRESCENZO, MD MRN: [id number] Date of Birth: 03/13/1961

## 2018-11-15 MED FILL — FAMOTIDINE 20 MG TABLET: 20 | 90 days supply | Qty: 90 | Fill #0

## 2018-11-17 MED FILL — TADALAFIL 5 MG TABS: 5 | 30 days supply | Qty: 30 | Fill #2

## 2018-11-18 ENCOUNTER — Ambulatory Visit: Payer: 59 | Attending: Student | Admitting: Physical Therapy

## 2018-11-18 ENCOUNTER — Encounter: Payer: Self-pay | Admitting: Physical Therapy

## 2018-11-18 DIAGNOSIS — M25511 Pain in right shoulder: Secondary | ICD-10-CM | POA: Diagnosis not present

## 2018-11-18 DIAGNOSIS — M25611 Stiffness of right shoulder, not elsewhere classified: Secondary | ICD-10-CM | POA: Insufficient documentation

## 2018-11-18 DIAGNOSIS — M6281 Muscle weakness (generalized): Secondary | ICD-10-CM | POA: Diagnosis not present

## 2018-11-18 DIAGNOSIS — M25662 Stiffness of left knee, not elsewhere classified: Secondary | ICD-10-CM | POA: Insufficient documentation

## 2018-11-18 MED FILL — traMADol HCL 50 MG TABS: 50 | 5 days supply | Qty: 40 | Fill #0

## 2018-11-18 NOTE — Therapy (Signed)
[place], [place], [address] Phone: [phone]   Fax:  [phone]  Physical Therapy Treatment  Patient Details  Name: Daniel NOVICK, MD MRN: [id number] Date of Birth: 12-15-1960 Referring Provider (PT): Dr [name]    Encounter Date: 11/18/2018  PT End of Session - 11/18/18 1149    Visit Number  34    Number of Visits  37    Date for PT Re-Evaluation  11/19/18    PT Start Time  1147    PT Stop Time  1638    PT Time Calculation (min)  58 min       Past Medical History:  Diagnosis Date  . Anxiety   . Aortic atherosclerosis ([place])   . BPH (benign prostatic hyperplasia)   . Chronic pain syndrome    neck and low back  . DDD (degenerative disc disease), cervical   . Diverticulosis   . DJD (degenerative joint disease)   . Ganglion cyst    12 mm , right posterior knee  . Hyperlipidemia   . Internal hemorrhoids   . Migraines   . OA (osteoarthritis)    knees, hands, right shoulder  . Positive QuantiFERON-TB Gold test   . Shoulder pain   . Tear of right rotator cuff   . Type 2 diabetes mellitus ([place])    last A1c 5.4 on 04-13-2018 in epic (followed by pcp)  . Wears glasses     Past Surgical History:  Procedure Laterality Date  . ANAL FISSURE REPAIR    . ARTHOSCOPIC ROTAOR CUFF REPAIR Right 06/15/2018   Procedure: RIGHT SHOULDER ARTHROSCOPY, DEBRIDEMENT, SUBACROMIAL DECOMPRESSION, DISTAL CLAVICLE RESECTION, ROTATOR CUFF REPAIR;  Surgeon: [name], MD;  Location: [facility];  Service: Orthopedics;  Laterality: Right;  . COLONOSCOPY  05-26-2017   dr Henrene Pastor  . Hip Resurface  2006  . KNEE ARTHROSCOPY W/ MENISCAL REPAIR Bilateral right 1993; left 2010  . MASS EXCISION Right 04/21/2018   Procedure: EXCISION RIGHT THIGH SOFT TISSUE MASS;  Surgeon: [name], MD;  [facility];  Service: Orthopedics;  Laterality: Right;  . SEPTOPLASTY    . SHOULDER ARTHROSCOPY WITH  ROTATOR CUFF REPAIR Left   . SLAP Tear Repair  2008  . TONSILLECTOMY    . 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 TOTAL KNEE ARTHROPLASTY WITH REVISION COMPONENTS Left 04/21/2018   Procedure: Left knee polyethylene exchange ;  Surgeon: [name], MD;  [facility];  Service: Orthopedics;  Laterality: Left;    There were no vitals filed for this visit.  Subjective Assessment - 11/18/18 1149    Subjective  Pain is a lot less and range of motion is alot better.     Currently in Pain?  No/denies         [facility] PT Assessment - 11/18/18 0001      AROM   Right Shoulder Flexion  140 Degrees    Right Shoulder ABduction  128 Degrees    Right Shoulder Internal Rotation  --   reach to L1   Right Shoulder External Rotation  --   Midline T4                  [facility] Adult PT Treatment/Exercise - 11/18/18 0001      Shoulder Exercises: Standing   Flexion  10 reps  2 sets    Theraband Level (Shoulder Flexion)  Level 1 (Yellow)    Flexion Limitations  wall walk + liftoff    ABduction  10 reps   2 sets   ABduction Limitations  yellow tband    Other Standing Exercises  bent over flx/horiz abd/ext 2 x 10    2#   Other Standing Exercises  scap retraction, ABCs 2lb (A-M) then removed weight for remaining letters, wall push ups x 20       Shoulder Exercises: ROM/Strengthening   UBE (Upper Arm Bike)  2/2 L4      Shoulder Exercises: Body Blade   Flexion Limitations  perpendicular to body , low , 4 x30 s    ABduction Limitations  at side 4 x 30 s     External Rotation Limitations  4*30s      Vasopneumatic   Number Minutes Vasopneumatic   15 minutes    Vasopnuematic Location   Shoulder    Vasopneumatic Pressure  Low    Vasopneumatic Temperature   lowest           --               PT Short Term Goals - 08/20/18 1106      PT  SHORT TERM GOAL #1   Title  pt will be I with initial HEP given     Status  Achieved      PT SHORT TERM GOAL #2   Title  pt will be able to verbalize and demo techniques to prevent/ reduce edema/ pain via RICE and HEP)    Status  Achieved      PT SHORT TERM GOAL #3   Title  pt will increase L knee flexion to >/= 110 degrees and extension to >/= -5 degrees for functional progression     Status  Partially Met      PT SHORT TERM GOAL #4   Title  pt will be able to stand/ ambulate >/= 15 min with </= 5/10 pain with LRAD to promote functional mobililty (01/12/2017    Status  Achieved      PT SHORT TERM GOAL #5   Title  Patient will increase passive right shoulder passive ER 40 percent per protocol     Status  Achieved        PT Long Term Goals - 10/20/18 1312      Additional Long Term Goals   Additional Long Term Goals  Yes      PT LONG TERM GOAL #6   Title  Patient will demsotrate 4+/5 grossright shoulder strength in order to perfrom ADL's     Baseline  see fowsheet    Status  On-going      PT LONG TERM GOAL #7   Title  Patient will demsotrate WNL active ER and IR on the right wihtout pain in order to perform ADL's     Baseline  WFL passively but limited actively for ADLs    Status  On-going      PT LONG TERM GOAL #8   Title  Pt will demo proper form with lifting, pushing and pulling as required for work    Baseline  limited to light weights at eval, lacks necessary strength/form for [facility]    Time  4    Period  Weeks    Status  New    Target Date  11/19/18            Plan -  11/18/18 1320    Clinical Impression Statement  Continued with strengthening today. AROM has impoved. He reports some end range pain with reaching. Overall improving. 4 more weeks at 1 x per week scheduled per MD recommendation at last follow up.     PT Next Visit Plan  ERO to allow last 4 visits. post chain strength, OH endurance, review any work requirements    PT Home Exercise Plan  updated 1/15:  scap retraction, upper trap stretch high, door pec stretch, IR stretch, cervical retraction SNAG, wall push up, tband diagonals, standing alphabet, bent over flx/abd/ext, wall flx+liftof    Consulted and Agree with Plan of Care  Patient       Patient will benefit from skilled therapeutic intervention in order to improve the following deficits and impairments:  Pain, Decreased activity tolerance, Decreased range of motion, Decreased strength, Difficulty walking, Increased edema, Impaired UE functional use, Decreased endurance  Visit Diagnosis: Stiffness of right shoulder, not elsewhere classified  Acute pain of right shoulder  Stiffness of left knee, not elsewhere classified  Muscle weakness (generalized)     Problem List Patient Active Problem List   Diagnosis Date Noted  . Right shoulder injury 06/15/2018  . S/P arthroscopy of right shoulder 06/15/2018  . Failed total knee arthroplasty ([place]) 04/21/2018  . OA (osteoarthritis) of knee 12/08/2016  . 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 Cervical facet joint syndrome 12/17/2015  . Chronic bilateral low back pain without sciatica 12/17/2015  . Primary osteoarthritis of left knee 12/17/2015    Dorene Ar, PTA 11/18/2018, 1:27 PM  [facility] [address] [place], [place], [address] Phone: [phone]   Fax:  [phone]  Name: Daniel SAMAAN, MD MRN: [id number] Date of Birth: 1961/10/08

## 2018-11-22 ENCOUNTER — Ambulatory Visit: Payer: 59 | Admitting: Physical Therapy

## 2018-11-22 ENCOUNTER — Encounter: Payer: Self-pay | Admitting: Physical Therapy

## 2018-11-22 DIAGNOSIS — M25511 Pain in right shoulder: Secondary | ICD-10-CM

## 2018-11-22 DIAGNOSIS — M6281 Muscle weakness (generalized): Secondary | ICD-10-CM | POA: Diagnosis not present

## 2018-11-22 DIAGNOSIS — M25662 Stiffness of left knee, not elsewhere classified: Secondary | ICD-10-CM | POA: Diagnosis not present

## 2018-11-22 DIAGNOSIS — M25611 Stiffness of right shoulder, not elsewhere classified: Secondary | ICD-10-CM

## 2018-11-22 MED FILL — AMOXICILLIN 500 MG CAPSULE: 500 | 2 days supply | Qty: 8 | Fill #0

## 2018-11-22 NOTE — Therapy (Signed)
[place], [place], [address] Phone: [phone]   Fax:  [phone]  Physical Therapy Treatment/ERO  Patient Details  Name: Daniel OLAZABAL, [name] MRN: [id number] Date of Birth: Aug 16, 1961 Referring Provider (PT): Dr [name]    Encounter Date: 11/22/2018  PT End of Session - 11/22/18 1154    Visit Number  35    Number of Visits  41    Date for PT Re-Evaluation  01/07/19    Authorization Type   7/34/2876 recert for knee; 81/15/7262 for recert on shoulder     PT Start Time  1151    PT Stop Time  1250    PT Time Calculation (min)  59 min    Activity Tolerance  Patient tolerated treatment well    Behavior During Therapy  [facility] for tasks assessed/performed       Past Medical History:  Diagnosis Date  . Anxiety   . Aortic atherosclerosis ([place])   . BPH (benign prostatic hyperplasia)   . Chronic pain syndrome    neck and low back  . DDD (degenerative disc disease), cervical   . Diverticulosis   . DJD (degenerative joint disease)   . Ganglion cyst    12 mm , right posterior knee  . Hyperlipidemia   . Internal hemorrhoids   . Migraines   . OA (osteoarthritis)    knees, hands, right shoulder  . Positive QuantiFERON-TB Gold test   . Shoulder pain   . Tear of right rotator cuff   . Type 2 diabetes mellitus ([place])    last A1c 5.4 on 04-13-2018 in epic (followed by pcp)  . Wears glasses     Past Surgical History:  Procedure Laterality Date  . ANAL FISSURE REPAIR    . ARTHOSCOPIC ROTAOR CUFF REPAIR Right 06/15/2018   Procedure: RIGHT SHOULDER ARTHROSCOPY, DEBRIDEMENT, SUBACROMIAL DECOMPRESSION, DISTAL CLAVICLE RESECTION, ROTATOR CUFF REPAIR;  Surgeon: [name], [name];  Location: [place];  Service: Orthopedics;  Laterality: Right;  . COLONOSCOPY  05-26-2017   dr Henrene Pastor  . Hip Resurface  2006  . KNEE ARTHROSCOPY W/ MENISCAL REPAIR Bilateral right 1993; left 2010  . MASS EXCISION  Right 04/21/2018   Procedure: EXCISION RIGHT THIGH SOFT TISSUE MASS;  Surgeon: [name], [name];  [facility];  Service: Orthopedics;  Laterality: Right;  . SEPTOPLASTY    . SHOULDER ARTHROSCOPY WITH ROTATOR CUFF REPAIR Left   . SLAP Tear Repair  2008  . TONSILLECTOMY    . TOTAL KNEE ARTHROPLASTY Left 12/08/2016   Procedure: LEFT TOTAL KNEE ARTHROPLASTY;  Surgeon: [name], [name];  [facility];  Service: Orthopedics;  Laterality: Left;  . TOTAL KNEE ARTHROPLASTY WITH REVISION COMPONENTS Left 04/21/2018   Procedure: Left knee polyethylene exchange ;  Surgeon: [name], [name];  [facility];  Service: Orthopedics;  Laterality: Left;    There were no vitals filed for this visit.  Subjective Assessment - 11/22/18 1155    Subjective  continues to feel limited in end range motion as far as tightness and weakness. limited to 50lb when lifting. hoping to go back to work in next 2 weeks. 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 feels that grip has improved.     Patient Stated Goals  return to normal    Currently in Pain?  Yes    Pain Score  7     Pain Location  Shoulder    Pain Orientation  Right    Pain Descriptors /  Indicators  Tightness    Aggravating Factors   end range motion    Pain Relieving Factors  rest         [facility] PT Assessment - 11/22/18 0001      Assessment   Medical Diagnosis  Right RTC repair     Referring Provider (PT)  Dr [name]     Onset Date/Surgical Date  06/15/18      Sensation   Additional Comments  WFL      AROM   Right Shoulder Flexion  140 Degrees    Right Shoulder ABduction  128 Degrees    Right Shoulder Internal Rotation  --   L1   Right Shoulder External Rotation  --   midline T4     Strength   Right Shoulder Flexion  4/5   resistance noted through dropping range   Right Shoulder Extension  5/5    Right Shoulder ABduction  4+/5    Right Shoulder Internal Rotation  5/5    Right Shoulder External Rotation  4+/5                    [facility] Adult PT Treatment/Exercise - 11/22/18 0001      Shoulder Exercises: Standing   Protraction Limitations  FM 7lb OH punch+; lateral press OH    Extension Limitations  FM 7lb from OH palm fwd    Other Standing Exercises  triceps press on ball on wall    Other Standing Exercises  bosu planks      Manual Therapy   Soft tissue mobilization  Rt upper trap, suboccipitals             PT Education - 11/22/18 1252    Education Details  goals discussion, HEP, exercise form/rationale, progression of exercises, return to work    [employer]) Educated  Patient    Methods  Explanation;Demonstration;Tactile cues;Verbal cues    Comprehension  Verbalized understanding;Need further instruction;Returned demonstration;Verbal cues required;Tactile cues required       PT Short Term Goals - 08/20/18 1106      PT SHORT TERM GOAL #1   Title  pt will be I with initial HEP given     Status  Achieved      PT SHORT TERM GOAL #2   Title  pt will be able to verbalize and demo techniques to prevent/ reduce edema/ pain via RICE and HEP)    Status  Achieved      PT SHORT TERM GOAL #3   Title  pt will increase L knee flexion to >/= 110 degrees and extension to >/= -5 degrees for functional progression     Status  Partially Met      PT SHORT TERM GOAL #4   Title  pt will be able to stand/ ambulate >/= 15 min with </= 5/10 pain with LRAD to promote functional mobililty (01/12/2017    Status  Achieved      PT SHORT TERM GOAL #5   Title  Patient will increase passive right shoulder passive ER 40 percent per protocol     Status  Achieved        PT Long Term Goals - 11/22/18 1159      PT LONG TERM GOAL #6   Title  Patient will demsotrate 4+/5 grossright shoulder strength in order to perfrom ADL's     Baseline  see flowsheet, limited with longer holds in MMT    Status  On-going  PT LONG TERM GOAL #7   Title  Patient will demsotrate WNL active ER and IR on the right  wihtout pain in order to perform ADL's     Baseline  able    Status  Achieved      PT LONG TERM GOAL #8   Title  Pt will demo proper form with lifting, pushing and pulling as required for work    Baseline  light weights at this time- still to be challenged with instability such as moving patients, lacking endurance esp at higher ranges    Status  On-going            Plan - 11/22/18 1255    Clinical Impression Statement  Overall pt continues to make improvements but is limited in functional endurance in overhead motions. He hopes to return to work in the next 2 weeks and will be required to perform lifting of patients and endurance for long shift. Began challenging CKC, unstable motions today which pt tolerated well. Fatigued in [facility] motions using 7lb on free motion. Will continue to benefit from skilled PT in order to continue challenging functional strength and endurance and creating appropriate final HEP. Would like to add TPDN to POC to decrease spasm in Rt upper trap which is affecting biomechanical chain control.     PT Frequency  1x / week    PT Duration  6 weeks    PT Treatment/Interventions  Manual techniques;Patient/family education;Therapeutic activities;Therapeutic exercise;Cryotherapy;Vasopneumatic Device;Passive range of motion;Taping;Functional mobility training;Gait training;Stair training;Neuromuscular re-education;Moist Heat;Dry needling    PT Next Visit Plan  instability training, end range endurance    PT Home Exercise Plan  updated 1/15: scap retraction, upper trap stretch high, door pec stretch, IR stretch, cervical retraction SNAG, wall push up, tband diagonals, standing alphabet, bent over flx/abd/ext, wall flx+liftof    Consulted and Agree with Plan of Care  Patient       Patient will benefit from skilled therapeutic intervention in order to improve the following deficits and impairments:  Pain, Decreased activity tolerance, Decreased range of motion, Decreased  strength, Difficulty walking, Increased edema, Impaired UE functional use, Decreased endurance  Visit Diagnosis: Stiffness of right shoulder, not elsewhere classified - Plan: PT plan of care cert/re-cert  Acute pain of right shoulder - Plan: PT plan of care cert/re-cert     Problem List Patient Active Problem List   Diagnosis Date Noted  . Right shoulder injury 06/15/2018  . S/P arthroscopy of right shoulder 06/15/2018  . Failed total knee arthroplasty ([place]) 04/21/2018  . OA (osteoarthritis) of knee 12/08/2016  . 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 Cervical facet joint syndrome 12/17/2015  . Chronic bilateral low back pain without sciatica 12/17/2015  . Primary osteoarthritis of left knee 12/17/2015    Davionte Lusby C. Markiesha Delia PT, DPT 11/22/18 1:04 PM   [facility] [address] [place], [place], [address] Phone: [phone]   Fax:  [phone]  Name: Daniel DENN, [name] MRN: [id number] Date of Birth: [birth year]

## 2018-11-25 MED FILL — traMADol HCL 50 MG TABS: 50 | 30 days supply | Qty: 240 | Fill #0 | Status: TO

## 2018-11-29 ENCOUNTER — Encounter: Payer: Self-pay | Admitting: Physical Therapy

## 2018-11-29 ENCOUNTER — Ambulatory Visit: Payer: 59 | Admitting: Physical Therapy

## 2018-11-29 DIAGNOSIS — M6281 Muscle weakness (generalized): Secondary | ICD-10-CM | POA: Diagnosis not present

## 2018-11-29 DIAGNOSIS — M25611 Stiffness of right shoulder, not elsewhere classified: Secondary | ICD-10-CM

## 2018-11-29 DIAGNOSIS — M25662 Stiffness of left knee, not elsewhere classified: Secondary | ICD-10-CM | POA: Diagnosis not present

## 2018-11-29 DIAGNOSIS — M25511 Pain in right shoulder: Secondary | ICD-10-CM

## 2018-11-29 NOTE — Therapy (Signed)
[place], [place], [address] Phone: [phone]   Fax:  [phone]  Physical Therapy Treatment  Patient Details  Name: Daniel BARICH, [name] MRN: [id number] Date of Birth: 04-Feb-1961 Referring Provider (PT): Dr [name]    Encounter Date: 11/29/2018  PT End of Session - 11/29/18 1620    Visit Number  36    Number of Visits  41    Date for PT Re-Evaluation  01/07/19    Authorization Type   9/37/3428 recert for knee; 76/81/1572 for recert on shoulder     PT Start Time  1350   pt arrived late   PT Stop Time  1430    PT Time Calculation (min)  40 min    Activity Tolerance  Patient tolerated treatment well    Behavior During Therapy  [facility] for tasks assessed/performed       Past Medical History:  Diagnosis Date  . Anxiety   . Aortic atherosclerosis ([place])   . BPH (benign prostatic hyperplasia)   . Chronic pain syndrome    neck and low back  . DDD (degenerative disc disease), cervical   . Diverticulosis   . DJD (degenerative joint disease)   . Ganglion cyst    12 mm , right posterior knee  . Hyperlipidemia   . Internal hemorrhoids   . Migraines   . OA (osteoarthritis)    knees, hands, right shoulder  . Positive QuantiFERON-TB Gold test   . Shoulder pain   . Tear of right rotator cuff   . Type 2 diabetes mellitus ([place])    last A1c 5.4 on 04-13-2018 in epic (followed by pcp)  . Wears glasses     Past Surgical History:  Procedure Laterality Date  . ANAL FISSURE REPAIR    . ARTHOSCOPIC ROTAOR CUFF REPAIR Right 06/15/2018   Procedure: RIGHT SHOULDER ARTHROSCOPY, DEBRIDEMENT, SUBACROMIAL DECOMPRESSION, DISTAL CLAVICLE RESECTION, ROTATOR CUFF REPAIR;  Surgeon: [name], [name];  Location: [place];  Service: Orthopedics;  Laterality: Right;  . COLONOSCOPY  05-26-2017   dr Henrene Pastor  . Hip Resurface  2006  . KNEE ARTHROSCOPY W/ MENISCAL REPAIR Bilateral right 1993; left 2010  .  MASS EXCISION Right 04/21/2018   Procedure: EXCISION RIGHT THIGH SOFT TISSUE MASS;  Surgeon: [name], [name];  [facility];  Service: Orthopedics;  Laterality: Right;  . SEPTOPLASTY    . SHOULDER ARTHROSCOPY WITH ROTATOR CUFF REPAIR Left   . SLAP Tear Repair  2008  . TONSILLECTOMY    . TOTAL KNEE ARTHROPLASTY Left 12/08/2016   Procedure: LEFT TOTAL KNEE ARTHROPLASTY;  Surgeon: [name], [name];  [facility];  Service: Orthopedics;  Laterality: Left;  . TOTAL KNEE ARTHROPLASTY WITH REVISION COMPONENTS Left 04/21/2018   Procedure: Left knee polyethylene exchange ;  Surgeon: [name], [name];  [facility];  Service: Orthopedics;  Laterality: Left;    There were no vitals filed for this visit.  Subjective Assessment - 11/29/18 1619    Subjective  got a massage yesterday so it's a little less tight. shoulder is feeling pretty good.     Patient Stated Goals  return to normal                       [facility] Adult PT Treatment/Exercise - 11/29/18 0001      Shoulder Exercises: Standing   Protraction Limitations  OH reach 3lb weight wall taps    Row Limitations  plank  row 5lb on counter    Other Standing Exercises  horizontal adduction diagonal reach      Shoulder Exercises: Stretch   Other Shoulder Stretches  door pec stretch      Moist Heat Therapy   Number Minutes Moist Heat  15 Minutes    Moist Heat Location  Shoulder      Manual Therapy   Manual therapy comments  skilled palpation and monitoring during TPDN    Soft tissue mobilization  Rt upper trap       Trigger Point Dry Needling - 11/29/18 1401    Consent Given?  Yes    Muscles Treated Upper Body  Upper trapezius    Upper Trapezius Response  Twitch reponse elicited;Palpable increased muscle length             PT Short Term Goals - 08/20/18 1106      PT SHORT TERM GOAL #1   Title  pt will be I with initial HEP given     Status  Achieved      PT SHORT TERM GOAL #2   Title  pt will  be able to verbalize and demo techniques to prevent/ reduce edema/ pain via RICE and HEP)    Status  Achieved      PT SHORT TERM GOAL #3   Title  pt will increase L knee flexion to >/= 110 degrees and extension to >/= -5 degrees for functional progression     Status  Partially Met      PT SHORT TERM GOAL #4   Title  pt will be able to stand/ ambulate >/= 15 min with </= 5/10 pain with LRAD to promote functional mobililty (01/12/2017    Status  Achieved      PT SHORT TERM GOAL #5   Title  Patient will increase passive right shoulder passive ER 40 percent per protocol     Status  Achieved        PT Long Term Goals - 11/22/18 1159      PT LONG TERM GOAL #6   Title  Patient will demsotrate 4+/5 grossright shoulder strength in order to perfrom ADL's     Baseline  see flowsheet, limited with longer holds in MMT    Status  On-going      PT LONG TERM GOAL #7   Title  Patient will demsotrate WNL active ER and IR on the right wihtout pain in order to perform ADL's     Baseline  able    Status  Achieved      PT LONG TERM GOAL #8   Title  Pt will demo proper form with lifting, pushing and pulling as required for work    Baseline  light weights at this time- still to be challenged with instability such as moving patients, lacking endurance esp at higher ranges    Status  On-going            Plan - 11/29/18 1621    Clinical Impression Statement  TP DN to upper trap decreased excessive tension. cues for scapular control required with fatigue, CKC most difficult. will continue light weight with endurance challenges.     PT Treatment/Interventions  Manual techniques;Patient/family education;Therapeutic activities;Therapeutic exercise;Cryotherapy;Vasopneumatic Device;Passive range of motion;Taping;Functional mobility training;Gait training;Stair training;Neuromuscular re-education;Moist Heat;Dry needling    PT Next Visit Plan  kettle ball carrying    PT Home Exercise Plan  updated 1/15:  scap retraction, upper trap stretch high, door pec stretch, IR stretch, cervical  retraction SNAG, wall push up, tband diagonals, standing alphabet, bent over flx/abd/ext, wall flx+liftof; see scanned pt instructions    Consulted and Agree with Plan of Care  Patient       Patient will benefit from skilled therapeutic intervention in order to improve the following deficits and impairments:  Pain, Decreased activity tolerance, Decreased range of motion, Decreased strength, Difficulty walking, Increased edema, Impaired UE functional use, Decreased endurance  Visit Diagnosis: Stiffness of right shoulder, not elsewhere classified  Acute pain of right shoulder     Problem List Patient Active Problem List   Diagnosis Date Noted  . Right shoulder injury 06/15/2018  . S/P arthroscopy of right shoulder 06/15/2018  . Failed total knee arthroplasty ([place]) 04/21/2018  . OA (osteoarthritis) of knee 12/08/2016  . 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 Cervical facet joint syndrome 12/17/2015  . Chronic bilateral low back pain without sciatica 12/17/2015  . Primary osteoarthritis of left knee 12/17/2015   Areona Homer C. Tiffani Kadow PT, DPT 11/29/18 4:24 PM   [facility] [address] [place], [place], [address] Phone: [phone]   Fax:  [phone]  Name: Daniel REMER, [name] MRN: [id number] Date of Birth: 1960/10/20

## 2018-12-01 MED FILL — ISONIAZID 300 MG TABLET: 300 | 30 days supply | Qty: 30 | Fill #4 | Status: TO

## 2018-12-01 MED FILL — diazePAM 5 MG TABS: 5 | 30 days supply | Qty: 60 | Fill #1 | Status: TO

## 2018-12-06 ENCOUNTER — Ambulatory Visit: Payer: 59 | Admitting: Physical Therapy

## 2018-12-06 ENCOUNTER — Encounter: Payer: Self-pay | Admitting: Physical Therapy

## 2018-12-06 DIAGNOSIS — M25662 Stiffness of left knee, not elsewhere classified: Secondary | ICD-10-CM | POA: Diagnosis not present

## 2018-12-06 DIAGNOSIS — M25511 Pain in right shoulder: Secondary | ICD-10-CM

## 2018-12-06 DIAGNOSIS — Z79899 Other long term (current) drug therapy: Secondary | ICD-10-CM | POA: Diagnosis not present

## 2018-12-06 DIAGNOSIS — M25611 Stiffness of right shoulder, not elsewhere classified: Secondary | ICD-10-CM | POA: Diagnosis not present

## 2018-12-06 DIAGNOSIS — M6281 Muscle weakness (generalized): Secondary | ICD-10-CM | POA: Diagnosis not present

## 2018-12-06 NOTE — Therapy (Signed)
[place], [place], [address] Phone: [phone]   Fax:  [phone]  Physical Therapy Treatment  Patient Details  Name: Daniel CROTTY, [name] MRN: [id number] Date of Birth: April 27, 1961 Referring Provider (PT): Dr [name]    Encounter Date: 12/06/2018  PT End of Session - 12/06/18 1202    Visit Number  37    Number of Visits  41    Date for PT Re-Evaluation  01/07/19    PT Start Time  3614   pt arrived late   PT Stop Time  1251    PT Time Calculation (min)  50 min    Activity Tolerance  Patient tolerated treatment well    Behavior During Therapy  [facility] for tasks assessed/performed       Past Medical History:  Diagnosis Date  . Anxiety   . Aortic atherosclerosis ([place])   . BPH (benign prostatic hyperplasia)   . Chronic pain syndrome    neck and low back  . DDD (degenerative disc disease), cervical   . Diverticulosis   . DJD (degenerative joint disease)   . Ganglion cyst    12 mm , right posterior knee  . Hyperlipidemia   . Internal hemorrhoids   . Migraines   . OA (osteoarthritis)    knees, hands, right shoulder  . Positive QuantiFERON-TB Gold test   . Shoulder pain   . Tear of right rotator cuff   . Type 2 diabetes mellitus ([place])    last A1c 5.4 on 04-13-2018 in epic (followed by pcp)  . Wears glasses     Past Surgical History:  Procedure Laterality Date  . ANAL FISSURE REPAIR    . ARTHOSCOPIC ROTAOR CUFF REPAIR Right 06/15/2018   Procedure: RIGHT SHOULDER ARTHROSCOPY, DEBRIDEMENT, SUBACROMIAL DECOMPRESSION, DISTAL CLAVICLE RESECTION, ROTATOR CUFF REPAIR;  Surgeon: [name], [name];  Location: [place];  Service: Orthopedics;  Laterality: Right;  . COLONOSCOPY  05-26-2017   dr Henrene Pastor  . Hip Resurface  2006  . KNEE ARTHROSCOPY W/ MENISCAL REPAIR Bilateral right 1993; left 2010  . MASS EXCISION Right 04/21/2018   Procedure: EXCISION RIGHT THIGH SOFT TISSUE MASS;   Surgeon: [name], [name];  [facility];  Service: Orthopedics;  Laterality: Right;  . SEPTOPLASTY    . SHOULDER ARTHROSCOPY WITH ROTATOR CUFF REPAIR Left   . SLAP Tear Repair  2008  . TONSILLECTOMY    . TOTAL KNEE ARTHROPLASTY Left 12/08/2016   Procedure: LEFT TOTAL KNEE ARTHROPLASTY;  Surgeon: [name], [name];  [facility];  Service: Orthopedics;  Laterality: Left;  . TOTAL KNEE ARTHROPLASTY WITH REVISION COMPONENTS Left 04/21/2018   Procedure: Left knee polyethylene exchange ;  Surgeon: [name], [name];  [facility];  Service: Orthopedics;  Laterality: Left;    There were no vitals filed for this visit.  Subjective Assessment - 12/06/18 1202    Subjective  Stiff and sore right now. neuropathic pain in upper trap. friday and saturday woke with pain. returning to work tomorrow.     Patient Stated Goals  return to normal    Currently in Pain?  Yes    Pain Score  4     Pain Location  Shoulder    Pain Orientation  Right    Pain Descriptors / Indicators  Sore   stiff   Aggravating Factors   lifting, poor weather    Pain Relieving Factors  massage, rest, ice  [place] Adult PT Treatment/Exercise - 12/06/18 0001      Shoulder Exercises: Standing   Row Limitations  plank row 5lb on counter    Other Standing Exercises  kettle ball carry at 90/90    Other Standing Exercises  triceps dips at counter 3 sets to max 9, 6, 5      Shoulder Exercises: ROM/Strengthening   UBE (Upper Arm Bike)  2/2 L3    Lat Pull Limitations  25lb x20 reps 2 sets      Shoulder Exercises: Stretch   Other Shoulder Stretches  door pec stretch 2*20s      Shoulder Exercises: Body Blade   External Rotation Limitations  3*30 elbow at side      Vasopneumatic   Number Minutes Vasopneumatic   15 minutes    Vasopnuematic Location   Shoulder    Vasopneumatic Pressure  Low    Vasopneumatic Temperature   lowest      Manual Therapy   Soft tissue mobilization   IASTM Rt upper trap               PT Short Term Goals - 08/20/18 1106      PT SHORT TERM GOAL #1   Title  pt will be I with initial HEP given     Status  Achieved      PT SHORT TERM GOAL #2   Title  pt will be able to verbalize and demo techniques to prevent/ reduce edema/ pain via RICE and HEP)    Status  Achieved      PT SHORT TERM GOAL #3   Title  pt will increase L knee flexion to >/= 110 degrees and extension to >/= -5 degrees for functional progression     Status  Partially Met      PT SHORT TERM GOAL #4   Title  pt will be able to stand/ ambulate >/= 15 min with </= 5/10 pain with LRAD to promote functional mobililty (01/12/2017    Status  Achieved      PT SHORT TERM GOAL #5   Title  Patient will increase passive right shoulder passive ER 40 percent per protocol     Status  Achieved        PT Long Term Goals - 11/22/18 1159      PT LONG TERM GOAL #6   Title  Patient will demsotrate 4+/5 grossright shoulder strength in order to perfrom ADL's     Baseline  see flowsheet, limited with longer holds in MMT    Status  On-going      PT LONG TERM GOAL #7   Title  Patient will demsotrate WNL active ER and IR on the right wihtout pain in order to perform ADL's     Baseline  able    Status  Achieved      PT LONG TERM GOAL #8   Title  Pt will demo proper form with lifting, pushing and pulling as required for work    Baseline  light weights at this time- still to be challenged with instability such as moving patients, lacking endurance esp at higher ranges    Status  On-going            Plan - 12/06/18 1245    Clinical Impression Statement  some soreness of upper trap with faigue but demo good control of shrug. Returns to work tomorrow and was encouraged to take time to stretch and rest through shift.  PT Treatment/Interventions  Manual techniques;Patient/family education;Therapeutic activities;Therapeutic exercise;Cryotherapy;Vasopneumatic Device;Passive  range of motion;Taping;Functional mobility training;Gait training;Stair training;Neuromuscular re-education;Moist Heat;Dry needling    PT Next Visit Plan  cont triceps dips, carrying, lifting, how did work go?    PT Home Exercise Plan  updated 1/15: scap retraction, upper trap stretch high, door pec stretch, IR stretch, cervical retraction SNAG, wall push up, tband diagonals, standing alphabet, bent over flx/abd/ext, wall flx+liftof; see scanned pt instructions    Consulted and Agree with Plan of Care  Patient       Patient will benefit from skilled therapeutic intervention in order to improve the following deficits and impairments:  Pain, Decreased activity tolerance, Decreased range of motion, Decreased strength, Difficulty walking, Increased edema, Impaired UE functional use, Decreased endurance  Visit Diagnosis: Stiffness of right shoulder, not elsewhere classified  Acute pain of right shoulder     Problem List Patient Active Problem List   Diagnosis Date Noted  . Right shoulder injury 06/15/2018  . S/P arthroscopy of right shoulder 06/15/2018  . 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 OA (osteoarthritis) of knee 12/08/2016  . 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 Cervical facet joint syndrome 12/17/2015  . Chronic bilateral low back pain without sciatica 12/17/2015  . Primary osteoarthritis of left knee 12/17/2015   Brealyn Baril C. Flora Parks PT, DPT 12/06/18 12:48 PM   [facility] [address] [place], [place], [address] Phone: [phone]   Fax:  [phone]  Name: Daniel MOSSBERG, [name] MRN: [id number] Date of Birth: 12/23/1960

## 2018-12-10 MED FILL — TOPIRAMATE 25 MG TAB: 25 | 90 days supply | Qty: 90 | Fill #0 | Status: TO

## 2018-12-10 MED FILL — PRAVASTATIN NA 40 MG TAB: 40 | 90 days supply | Qty: 90 | Fill #0 | Status: TO

## 2018-12-10 MED FILL — RIZATRIPTAN 5 MG ODT: 5 | 30 days supply | Qty: 15 | Fill #0 | Status: TO

## 2018-12-13 ENCOUNTER — Encounter: Payer: Self-pay | Admitting: Physical Therapy

## 2018-12-13 ENCOUNTER — Ambulatory Visit: Payer: 59 | Attending: Student | Admitting: Physical Therapy

## 2018-12-13 DIAGNOSIS — M25611 Stiffness of right shoulder, not elsewhere classified: Secondary | ICD-10-CM | POA: Diagnosis not present

## 2018-12-13 DIAGNOSIS — M25511 Pain in right shoulder: Secondary | ICD-10-CM | POA: Diagnosis not present

## 2018-12-13 NOTE — Therapy (Addendum)
[place], [place], [address] Phone: [phone]   Fax:  [phone]  Physical Therapy Treatment/discharge  Patient Details  Name: Daniel MCMAHILL, [name] MRN: [id number] Date of Birth: June 06, 1961 Referring Provider (PT): Dr [name]    Encounter Date: 12/13/2018  PT End of Session - 12/13/18 1203    Visit Number  38    Number of Visits  41    Date for PT Re-Evaluation  01/07/19    Authorization Type   8/75/6433 recert for knee; 29/51/8841 for recert on shoulder     PT Start Time  1202   pt arrived late   PT Stop Time  1253    PT Time Calculation (min)  51 min    Activity Tolerance  Patient tolerated treatment well    Behavior During Therapy  [facility] for tasks assessed/performed       Past Medical History:  Diagnosis Date  . Anxiety   . Aortic atherosclerosis ([place])   . BPH (benign prostatic hyperplasia)   . Chronic pain syndrome    neck and low back  . DDD (degenerative disc disease), cervical   . Diverticulosis   . DJD (degenerative joint disease)   . Ganglion cyst    12 mm , right posterior knee  . Hyperlipidemia   . Internal hemorrhoids   . Migraines   . OA (osteoarthritis)    knees, hands, right shoulder  . Positive QuantiFERON-TB Gold test   . Shoulder pain   . Tear of right rotator cuff   . Type 2 diabetes mellitus ([place])    last A1c 5.4 on 04-13-2018 in epic (followed by pcp)  . Wears glasses     Past Surgical History:  Procedure Laterality Date  . ANAL FISSURE REPAIR    . ARTHOSCOPIC ROTAOR CUFF REPAIR Right 06/15/2018   Procedure: RIGHT SHOULDER ARTHROSCOPY, DEBRIDEMENT, SUBACROMIAL DECOMPRESSION, DISTAL CLAVICLE RESECTION, ROTATOR CUFF REPAIR;  Surgeon: [name], [name];  Location: [facility];  Service: Orthopedics;  Laterality: Right;  . COLONOSCOPY  05-26-2017   dr Henrene Pastor  . Hip Resurface  2006  . KNEE ARTHROSCOPY W/ MENISCAL REPAIR Bilateral right 1993; left  2010  . MASS EXCISION Right 04/21/2018   Procedure: EXCISION RIGHT THIGH SOFT TISSUE MASS;  Surgeon: [name], [name];  [facility];  Service: Orthopedics;  Laterality: Right;  . SEPTOPLASTY    . SHOULDER ARTHROSCOPY WITH ROTATOR CUFF REPAIR Left   . SLAP Tear Repair  2008  . TONSILLECTOMY    . TOTAL KNEE ARTHROPLASTY Left 12/08/2016   Procedure: LEFT TOTAL KNEE ARTHROPLASTY;  Surgeon: [name], [name];  [facility];  Service: Orthopedics;  Laterality: Left;  . TOTAL KNEE ARTHROPLASTY WITH REVISION COMPONENTS Left 04/21/2018   Procedure: Left knee polyethylene exchange ;  Surgeon: [name], [name];  [facility];  Service: Orthopedics;  Laterality: Left;    There were no vitals filed for this visit.  Subjective Assessment - 12/13/18 1205    Subjective  By the end of my shift I could not raise my arm overhead. My knee was also really swollen. Was able to find time to do stretches. Started using upper trap and leaning toward the end of the night.      Currently in Pain?  Yes    Pain Location  Shoulder    Pain Orientation  Right    Pain Descriptors / Indicators  Tightness   weak  [place] Adult PT Treatment/Exercise - 12/13/18 0001      Shoulder Exercises: Standing   External Rotation Limitations  iso hold walkouts with free motion 7lb    Flexion Limitations  freemotion W to press 3lb each    Row Limitations  iso wide & narrow row hold with lunges free motion 7lb each    Other Standing Exercises  biceps curl FM 3lb each    Other Standing Exercises  triceps press FM 7lb      Shoulder Exercises: ROM/Strengthening   UBE (Upper Arm Bike)  2/2 L2.5    [place]  OH reach 5lb; flx & scaption      Vasopneumatic   Number Minutes Vasopneumatic   15 minutes    Vasopnuematic Location   Shoulder    Vasopneumatic Pressure  Low    Vasopneumatic Temperature   lowest      Manual Therapy   Soft tissue mobilization  IASTM Rt upper trap              PT Education - 12/13/18 1255    Education Details  patient mobility    Person(s) Educated  Patient    Methods  Explanation    Comprehension  Verbalized understanding;Need further instruction       PT Short Term Goals - 08/20/18 1106      PT SHORT TERM GOAL #1   Title  pt will be I with initial HEP given     Status  Achieved      PT SHORT TERM GOAL #2   Title  pt will be able to verbalize and demo techniques to prevent/ reduce edema/ pain via RICE and HEP)    Status  Achieved      PT SHORT TERM GOAL #3   Title  pt will increase L knee flexion to >/= 110 degrees and extension to >/= -5 degrees for functional progression     Status  Partially Met      PT SHORT TERM GOAL #4   Title  pt will be able to stand/ ambulate >/= 15 min with </= 5/10 pain with LRAD to promote functional mobililty (01/12/2017    Status  Achieved      PT SHORT TERM GOAL #5   Title  Patient will increase passive right shoulder passive ER 40 percent per protocol     Status  Achieved        PT Long Term Goals - 11/22/18 1159      PT LONG TERM GOAL #6   Title  Patient will demsotrate 4+/5 grossright shoulder strength in order to perfrom ADL's     Baseline  see flowsheet, limited with longer holds in MMT    Status  On-going      PT LONG TERM GOAL #7   Title  Patient will demsotrate WNL active ER and IR on the right wihtout pain in order to perform ADL's     Baseline  able    Status  Achieved      PT LONG TERM GOAL #8   Title  Pt will demo proper form with lifting, pushing and pulling as required for work    Baseline  light weights at this time- 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     PT Treatment/Interventions  Manual techniques;Patient/family education;Therapeutic activities;Therapeutic exercise;Cryotherapy;Vasopneumatic Device;Passive range of motion;Taping;Functional mobility training;Gait training;Stair training;Neuromuscular re-education;Moist Heat;Dry needling    PT Next Visit Plan  OH endurance, review stretches for work, endurance challenges    PT Home Exercise Plan  updated 1/15: scap retraction, upper trap stretch high, door pec stretch, IR stretch, cervical retraction SNAG, wall push up, tband diagonals, standing alphabet, bent over flx/abd/ext, wall flx+liftof; see scanned pt instructions    Consulted and Agree with Plan of Care  Patient       Patient will benefit from skilled therapeutic intervention in order to improve the following deficits and impairments:  Pain, Decreased activity tolerance, Decreased range of motion, Decreased strength, Difficulty walking, Increased edema, Impaired UE functional use, Decreased endurance  Visit Diagnosis: Stiffness of right shoulder, not elsewhere classified  Acute pain of right shoulder     Problem List Patient Active Problem List   Diagnosis Date Noted  . Right shoulder injury 06/15/2018  . S/P arthroscopy of right shoulder 06/15/2018  . Failed total knee arthroplasty ([place]) 04/21/2018  . OA (osteoarthritis) of knee 12/08/2016  . 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 Cervical facet joint syndrome 12/17/2015  . Chronic bilateral low back pain without sciatica 12/17/2015  . Primary osteoarthritis of left knee 12/17/2015   Hjalmer Iovino C. Jesiah Yerby PT, DPT 12/13/18 12:56 PM   [facility] [address] [place], [place], [address] Phone: [phone]   Fax:  [phone]  Name: Daniel RUFO, [name] MRN: [id number] Date of Birth: Sep 25, 1961  PHYSICAL THERAPY DISCHARGE SUMMARY  Visits  from Start of Care: 68  Current functional level related to goals / functional outcomes: See above   Remaining deficits: See above   Education / Equipment: Anatomy of condition, POC, HEP, exercise form/rationale  Plan: Patient agrees to discharge.  Patient goals were partially met. Patient is being discharged due to being pleased with the current functional level.  ?????     Waylan Busta C. Christabell Loseke PT, DPT 06/08/19 4:58 PM

## 2018-12-17 MED FILL — FLUTICASONE PROP 50 MCG SPR: 50 | 60 days supply | Qty: 16 | Fill #0 | Status: TO

## 2018-12-17 MED FILL — TADALAFIL 5 MG TABS: 5 | 30 days supply | Qty: 30 | Fill #3 | Status: TO

## 2018-12-17 MED FILL — TAMSULOSIN HCL 0.4 MG CAP: 0.4 | 90 days supply | Qty: 180 | Fill #1

## 2018-12-20 DIAGNOSIS — M25511 Pain in right shoulder: Secondary | ICD-10-CM | POA: Diagnosis not present

## 2018-12-20 DIAGNOSIS — Z9889 Other specified postprocedural states: Secondary | ICD-10-CM | POA: Diagnosis not present

## 2018-12-20 MED FILL — AMOX-CLAV 875-125 MG TABLET: 875-125 | 10 days supply | Qty: 20 | Fill #0

## 2018-12-20 MED FILL — VENTOLIN HFA 90 MCG INHALER: 108 (90 BAS | 25 days supply | Qty: 18 | Fill #0

## 2019-01-05 MED FILL — metFORMIN HCL 1000 MG TABS: 1000 | 90 days supply | Qty: 180 | Fill #0

## 2019-01-05 MED FILL — ISONIAZID 300 MG TABLET: 300 | 30 days supply | Qty: 30 | Fill #0

## 2019-01-05 MED FILL — diazePAM 5 MG TABS: 5 | 30 days supply | Qty: 60 | Fill #0

## 2019-01-06 MED FILL — BUTALB-ACETAMIN-CAFF 50-325: 50-325-40 | 10 days supply | Qty: 60 | Fill #0

## 2019-01-06 MED FILL — oxyCODONE HCL 5 MG TABS: 5 | 30 days supply | Qty: 180 | Fill #0

## 2019-01-06 MED FILL — CELECOXIB 200 MG CAPSULE: 200 | 90 days supply | Qty: 90 | Fill #0

## 2019-01-06 MED FILL — RIZATRIPTAN 5 MG ODT: 5 | 30 days supply | Qty: 15 | Fill #0

## 2019-01-06 MED FILL — traMADol HCL 50 MG TABS: 50 | 30 days supply | Qty: 240 | Fill #0

## 2019-01-06 MED FILL — ZOLPIDEM TARTRATE 10 MG TAB: 10 | 90 days supply | Qty: 90 | Fill #0

## 2019-01-10 MED FILL — TADALAFIL 5 MG TABS: 5 | 30 days supply | Qty: 30 | Fill #0

## 2019-01-31 MED FILL — ISONIAZID 300 MG TABLET: 300 | 30 days supply | Qty: 30 | Fill #0

## 2019-01-31 MED FILL — RIZATRIPTAN 5 MG ODT: 5 | 30 days supply | Qty: 15 | Fill #0

## 2019-01-31 MED FILL — diazePAM 5 MG TABS: 5 | 30 days supply | Qty: 60 | Fill #0

## 2019-02-01 MED FILL — traMADol HCL 50 MG TABS: 50 | 30 days supply | Qty: 240 | Fill #0

## 2019-02-04 DIAGNOSIS — M545 Low back pain: Secondary | ICD-10-CM | POA: Diagnosis not present

## 2019-02-04 DIAGNOSIS — E1151 Type 2 diabetes mellitus with diabetic peripheral angiopathy without gangrene: Secondary | ICD-10-CM | POA: Diagnosis not present

## 2019-02-04 DIAGNOSIS — I10 Essential (primary) hypertension: Secondary | ICD-10-CM | POA: Diagnosis not present

## 2019-02-04 DIAGNOSIS — R7611 Nonspecific reaction to tuberculin skin test without active tuberculosis: Secondary | ICD-10-CM | POA: Diagnosis not present

## 2019-02-04 DIAGNOSIS — Z1331 Encounter for screening for depression: Secondary | ICD-10-CM | POA: Diagnosis not present

## 2019-02-04 MED FILL — FLUTICASONE PROP 50 MCG SPR: 50 | 60 days supply | Qty: 16 | Fill #0

## 2019-02-04 MED FILL — GABAPENTIN 300 MG CAPSULE: 300 | 90 days supply | Qty: 180 | Fill #0

## 2019-02-04 MED FILL — TADALAFIL 5 MG TABS: 5 | 30 days supply | Qty: 30 | Fill #1

## 2019-02-04 MED FILL — PRAVASTATIN NA 40 MG TAB: 40 | 90 days supply | Qty: 90 | Fill #0

## 2019-02-04 MED FILL — TOPIRAMATE 25 MG TABLET: 25 | 90 days supply | Qty: 90 | Fill #0

## 2019-02-04 MED FILL — BUTALBITAL-APAP-CAFFEINE 50: 50-325-40 | 10 days supply | Qty: 60 | Fill #1

## 2019-02-07 DIAGNOSIS — M546 Pain in thoracic spine: Secondary | ICD-10-CM | POA: Diagnosis not present

## 2019-02-07 DIAGNOSIS — M9901 Segmental and somatic dysfunction of cervical region: Secondary | ICD-10-CM | POA: Diagnosis not present

## 2019-02-07 DIAGNOSIS — M25369 Other instability, unspecified knee: Secondary | ICD-10-CM | POA: Diagnosis not present

## 2019-02-07 DIAGNOSIS — M25562 Pain in left knee: Secondary | ICD-10-CM | POA: Diagnosis not present

## 2019-02-07 DIAGNOSIS — M542 Cervicalgia: Secondary | ICD-10-CM | POA: Diagnosis not present

## 2019-02-07 DIAGNOSIS — M545 Low back pain: Secondary | ICD-10-CM | POA: Diagnosis not present

## 2019-02-07 DIAGNOSIS — M25362 Other instability, left knee: Secondary | ICD-10-CM | POA: Diagnosis not present

## 2019-02-17 MED FILL — KETOCONAZOLE 2% CREAM: 2 | 20 days supply | Qty: 30 | Fill #0

## 2019-03-03 MED FILL — diazePAM 5 MG TABS: 5 | 30 days supply | Qty: 60 | Fill #1

## 2019-03-03 MED FILL — RIZATRIPTAN 5 MG ODT: 5 | 30 days supply | Qty: 15 | Fill #1

## 2019-03-03 MED FILL — traMADol HCL 50 MG TABS: 50 | 30 days supply | Qty: 240 | Fill #1

## 2019-03-08 MED FILL — TADALAFIL 5 MG TABS: 5 | 90 days supply | Qty: 90 | Fill #0

## 2019-03-17 MED FILL — TAMSULOSIN HCL 0.4 MG CAP: 0.4 | 90 days supply | Qty: 180 | Fill #2

## 2019-03-22 DIAGNOSIS — M9901 Segmental and somatic dysfunction of cervical region: Secondary | ICD-10-CM | POA: Diagnosis not present

## 2019-03-22 DIAGNOSIS — M546 Pain in thoracic spine: Secondary | ICD-10-CM | POA: Diagnosis not present

## 2019-03-22 DIAGNOSIS — M545 Low back pain: Secondary | ICD-10-CM | POA: Diagnosis not present

## 2019-03-22 DIAGNOSIS — M542 Cervicalgia: Secondary | ICD-10-CM | POA: Diagnosis not present

## 2019-03-29 MED FILL — diazePAM 5 MG TABS: 5 | 30 days supply | Qty: 60 | Fill #0

## 2019-03-29 MED FILL — RIZATRIPTAN 5 MG ODT: 5 | 30 days supply | Qty: 15 | Fill #0

## 2019-03-29 MED FILL — metFORMIN HCL 1000 MG TABS: 1000 | 90 days supply | Qty: 180 | Fill #0

## 2019-03-29 MED FILL — FLUTICASONE PROP 50 MCG SPR: 50 | 60 days supply | Qty: 16 | Fill #0

## 2019-04-05 DIAGNOSIS — E1151 Type 2 diabetes mellitus with diabetic peripheral angiopathy without gangrene: Secondary | ICD-10-CM | POA: Diagnosis not present

## 2019-04-05 DIAGNOSIS — E7849 Other hyperlipidemia: Secondary | ICD-10-CM | POA: Diagnosis not present

## 2019-04-19 IMAGING — CT CT ABD-PELV W/ CM
2 of 5 series · 16 of 46 positions shown, 18 images · IV contrast (ISOVUE 300)
Comparison: None.

CLINICAL DATA: Left lower quadrant pain for 6 months with bloating.
Diabetes.

EXAM:
CT ABDOMEN AND PELVIS WITH CONTRAST
TECHNIQUE: Multidetector CT imaging of the abdomen and pelvis was performed
using the standard protocol following bolus administration of
intravenous contrast.
CONTRAST:  100mL IGW7AD-NNN IOPAMIDOL (IGW7AD-NNN) INJECTION 61%

[Series 2: abd/pel w · axial · 0.84mm/px · z∈[-495,-75]mm · 13 of 94 slices shown, 15 images]
[im 5/94  soft-tissue]
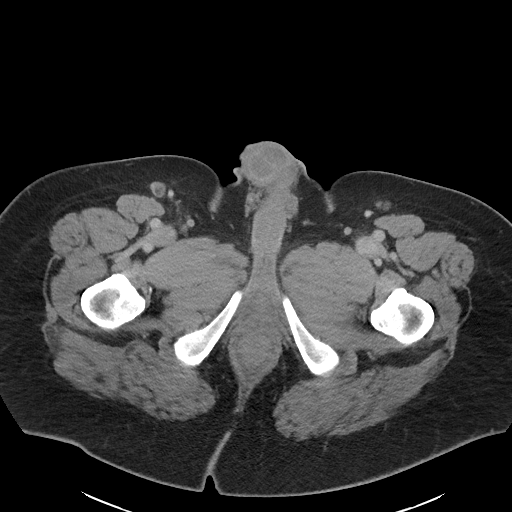
[im 5/94  bone]
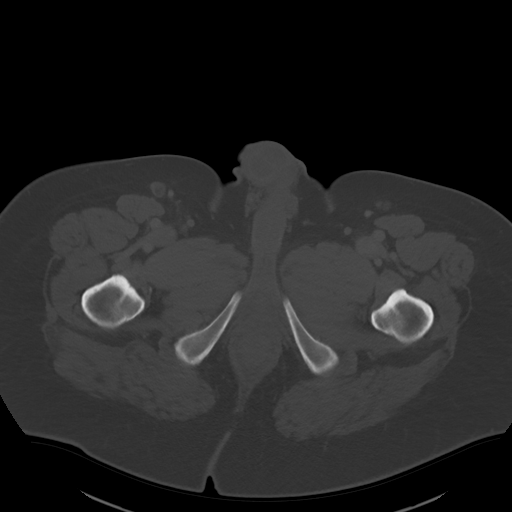
[im 15/94  soft-tissue]
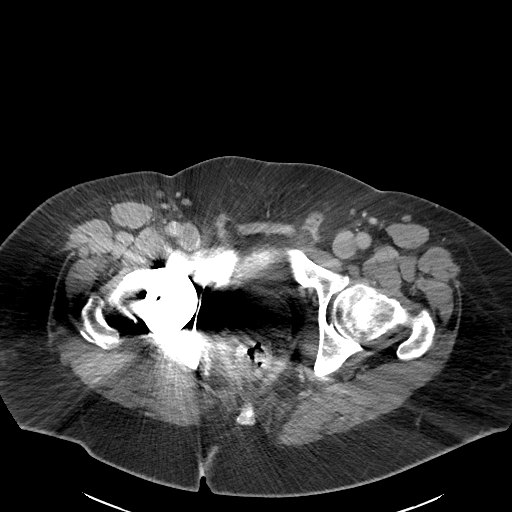
[im 20/94  soft-tissue]
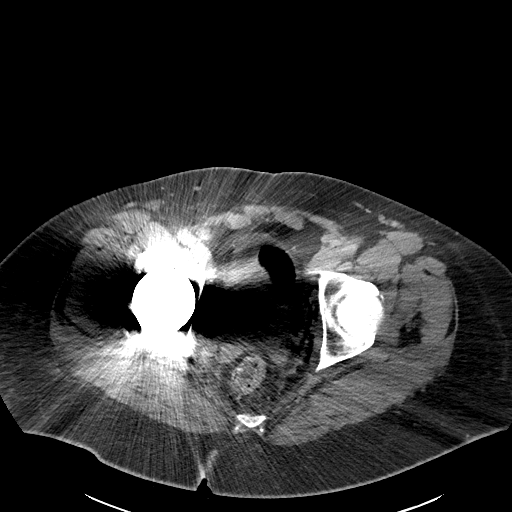
[im 25/94  soft-tissue]
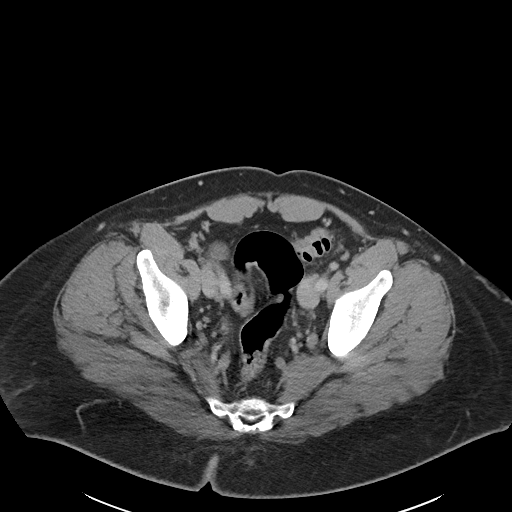
[im 35/94  soft-tissue]
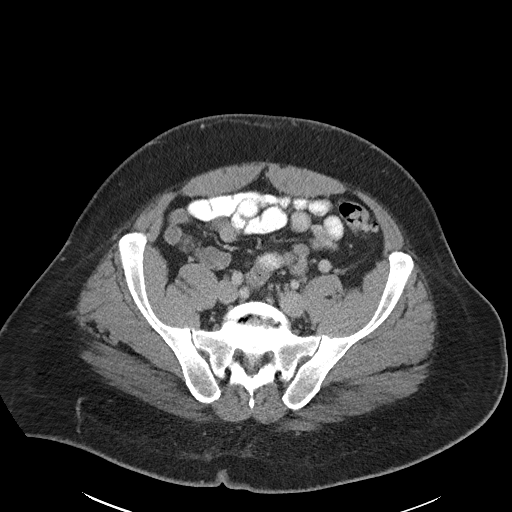
[im 40/94  soft-tissue]
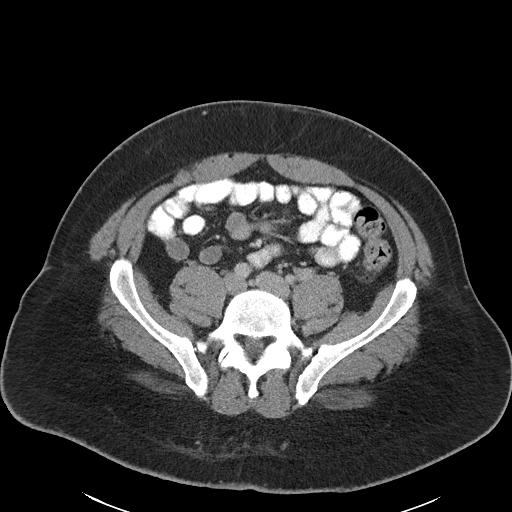
[im 49/94  soft-tissue]
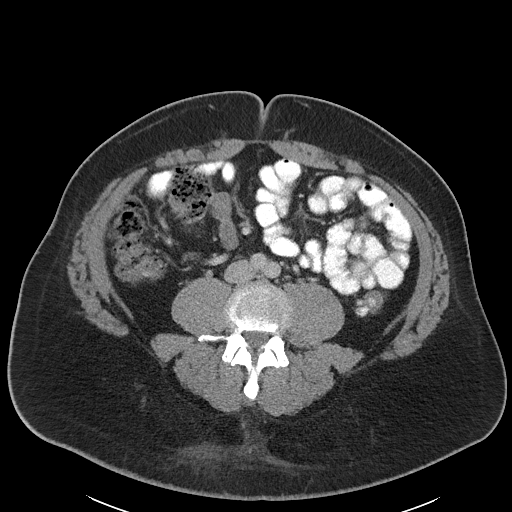
[im 54/94  soft-tissue]
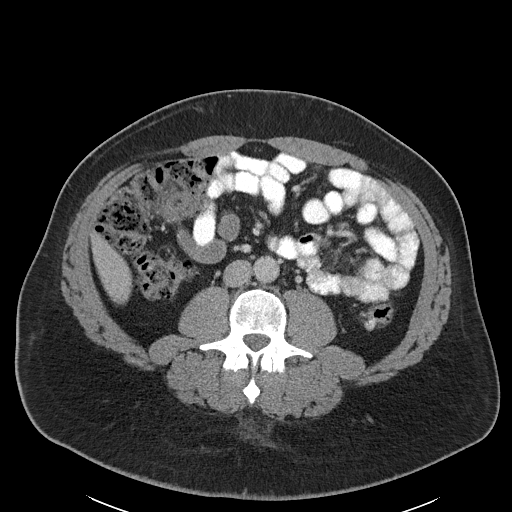
[im 59/94  soft-tissue]
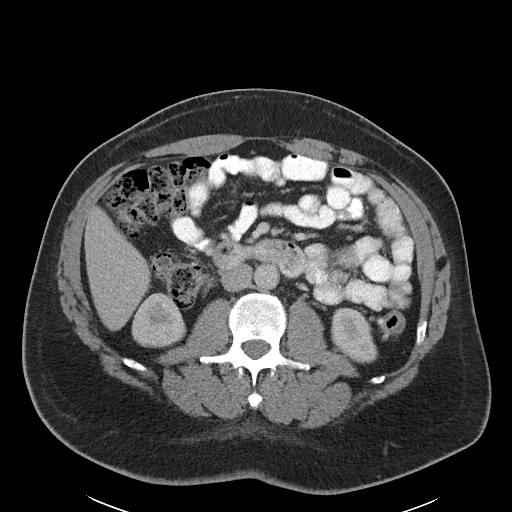
[im 59/94  bone]
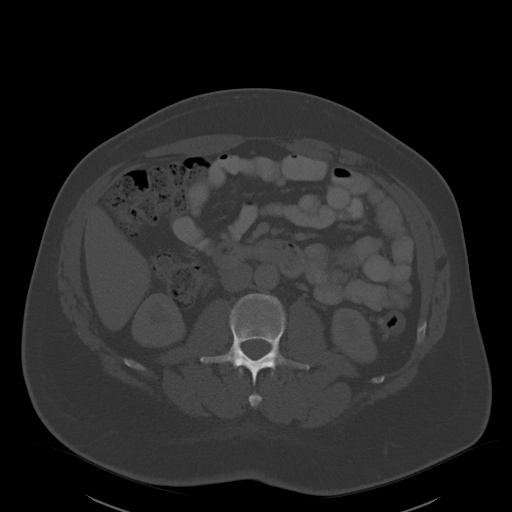
[im 69/94  soft-tissue]
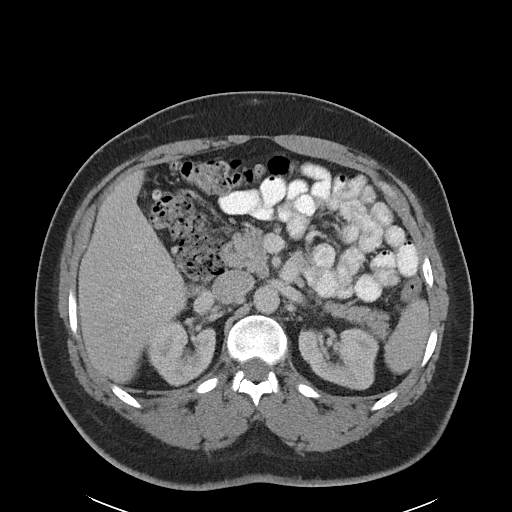
[im 74/94  soft-tissue]
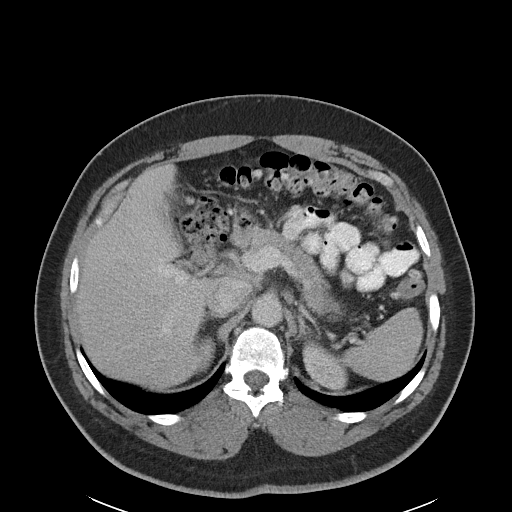
[im 79/94  soft-tissue]
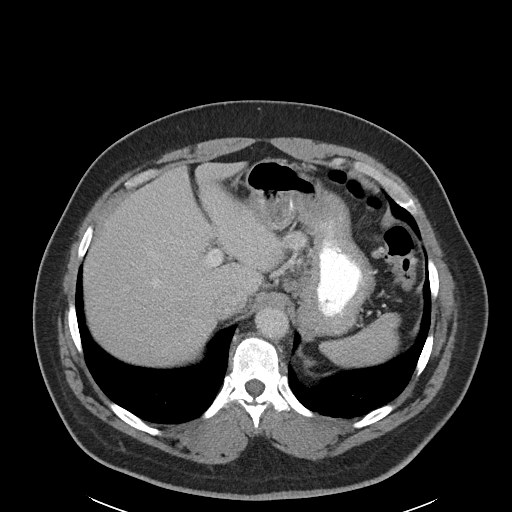
[im 89/94  soft-tissue]
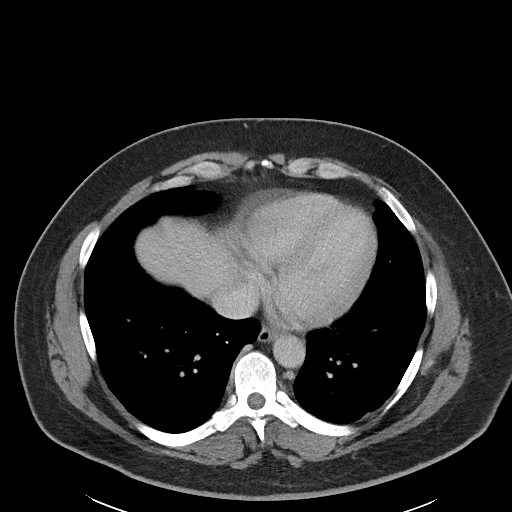

[Series 6: abd/pel w st · coronal · 0.74mm/px · 3 of 101 slices shown]
[im 34/101  soft-tissue]
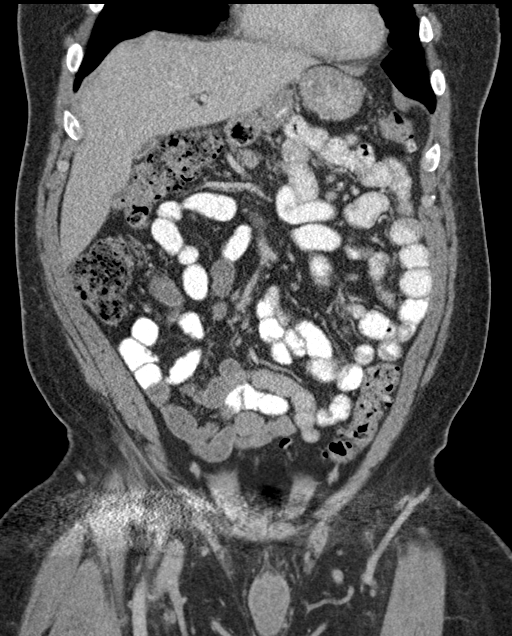
[im 45/101  soft-tissue]
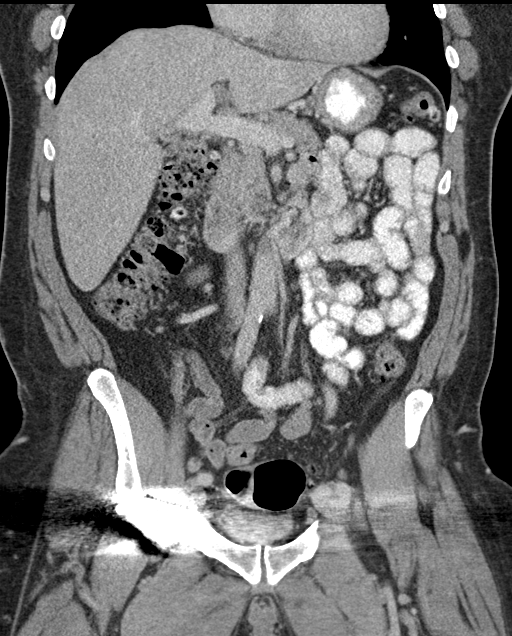
[im 56/101  soft-tissue]
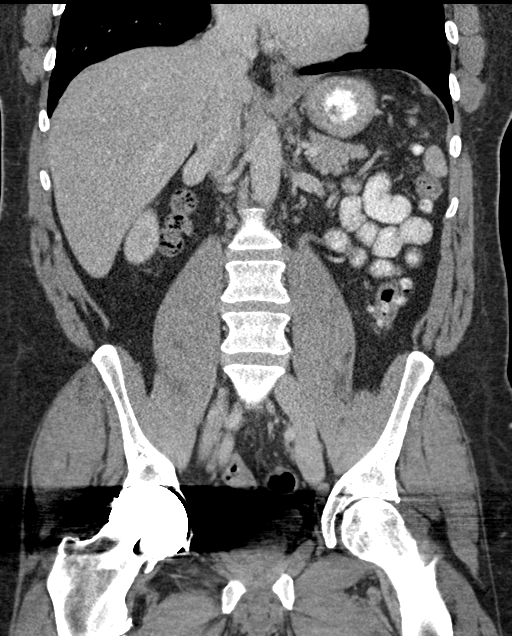

[16 of 46 positions shown; findings below may reference images not displayed]

FINDINGS: Lower chest: Clear lung bases. Normal heart size without pericardial
or pleural effusion.

Hepatobiliary: Normal liver. Normal gallbladder, without biliary
ductal dilatation.

Pancreas: Normal, without mass or ductal dilatation.

Spleen: Normal in size, without focal abnormality.

Adrenals/Urinary Tract: Normal adrenal glands. Normal kidneys,
without hydronephrosis. Moderate to markedly degraded evaluation of
the pelvis secondary to beam hardening artifact from right hip
arthroplasty. Urinary bladder not well evaluated.

Stomach/Bowel: Normal stomach, without wall thickening. Scattered
colonic diverticula. Colonic stool burden suggests constipation.
Normal terminal ileum and appendix. Normal small bowel.

Vascular/Lymphatic: Aortic atherosclerosis. No abdominal and no
gross pelvic adenopathy.

Reproductive: Prostate not well evaluated.

Other: No abdominopelvic fluid.  No free intraperitoneal air.

Musculoskeletal: Right hip arthroplasty. Degenerative disc disease
at the lumbosacral junction. Disc bulges at L4-5 and L5-S1.
IMPRESSION: 1.  No acute process in the abdomen or pelvis.
2.  Possible constipation.
3. Degraded evaluation of the pelvis secondary to beam hardening
artifact from right hip arthroplasty.
4.  Aortic Atherosclerosis ([id number]).

## 2019-04-19 MED FILL — ZOLPIDEM TARTRATE 10 MG TAB: 10 | 90 days supply | Qty: 90 | Fill #0

## 2019-04-19 MED FILL — oxyCODONE HCL 5 MG TABS: 5 | 30 days supply | Qty: 180 | Fill #0

## 2019-04-19 MED FILL — AMOXICILLIN 500 MG CAPSULE: 500 | 2 days supply | Qty: 8 | Fill #1

## 2019-04-22 MED FILL — RIZATRIPTAN 5 MG ODT: 5 | 30 days supply | Qty: 15 | Fill #1

## 2019-04-26 MED FILL — diazePAM 5 MG TABS: 5 | 30 days supply | Qty: 60 | Fill #1

## 2019-05-04 MED FILL — PRAVASTATIN NA 40 MG TAB: 40 | 90 days supply | Qty: 90 | Fill #0

## 2019-05-04 MED FILL — GABAPENTIN 300 MG CAPSULE: 300 | 90 days supply | Qty: 180 | Fill #0

## 2019-05-04 MED FILL — TOPIRAMATE 25 MG TAB: 25 | 90 days supply | Qty: 90 | Fill #0

## 2019-05-12 DIAGNOSIS — M542 Cervicalgia: Secondary | ICD-10-CM | POA: Diagnosis not present

## 2019-05-12 DIAGNOSIS — M545 Low back pain: Secondary | ICD-10-CM | POA: Diagnosis not present

## 2019-05-12 DIAGNOSIS — M546 Pain in thoracic spine: Secondary | ICD-10-CM | POA: Diagnosis not present

## 2019-05-12 DIAGNOSIS — M9901 Segmental and somatic dysfunction of cervical region: Secondary | ICD-10-CM | POA: Diagnosis not present

## 2019-06-09 DIAGNOSIS — M545 Low back pain: Secondary | ICD-10-CM | POA: Diagnosis not present

## 2019-06-09 DIAGNOSIS — M546 Pain in thoracic spine: Secondary | ICD-10-CM | POA: Diagnosis not present

## 2019-06-09 DIAGNOSIS — M542 Cervicalgia: Secondary | ICD-10-CM | POA: Diagnosis not present

## 2019-06-09 DIAGNOSIS — M9901 Segmental and somatic dysfunction of cervical region: Secondary | ICD-10-CM | POA: Diagnosis not present

## 2019-06-10 MED FILL — TADALAFIL 5 MG TABS: 5 | 90 days supply | Qty: 90 | Fill #0

## 2019-06-17 MED FILL — predniSONE 5 MG TABS: 5 | 8 days supply | Qty: 96 | Fill #0

## 2019-06-23 DIAGNOSIS — M9901 Segmental and somatic dysfunction of cervical region: Secondary | ICD-10-CM | POA: Diagnosis not present

## 2019-06-23 DIAGNOSIS — M542 Cervicalgia: Secondary | ICD-10-CM | POA: Diagnosis not present

## 2019-06-23 DIAGNOSIS — M545 Low back pain: Secondary | ICD-10-CM | POA: Diagnosis not present

## 2019-06-23 DIAGNOSIS — M546 Pain in thoracic spine: Secondary | ICD-10-CM | POA: Diagnosis not present

## 2019-06-27 MED FILL — GABAPENTIN 300 MG CAPSULE: 300 | 90 days supply | Qty: 270 | Fill #0

## 2019-06-27 MED FILL — oxyCODONE HCL 5 MG TABS: 5 | 30 days supply | Qty: 180 | Fill #0

## 2019-06-27 MED FILL — TAMSULOSIN HCL 0.4 MG CAP: 0.4 | 90 days supply | Qty: 180 | Fill #3

## 2019-06-27 MED FILL — diazePAM 5 MG TABS: 5 | 30 days supply | Qty: 60 | Fill #0

## 2019-07-07 DIAGNOSIS — M47812 Spondylosis without myelopathy or radiculopathy, cervical region: Secondary | ICD-10-CM | POA: Diagnosis not present

## 2019-07-11 MED FILL — BUTALB-ACETAMIN-CAFF 50-325: 50-325-40 | 10 days supply | Qty: 60 | Fill #0

## 2019-07-12 DIAGNOSIS — M9904 Segmental and somatic dysfunction of sacral region: Secondary | ICD-10-CM | POA: Diagnosis not present

## 2019-07-12 DIAGNOSIS — M545 Low back pain: Secondary | ICD-10-CM | POA: Diagnosis not present

## 2019-07-12 DIAGNOSIS — M9903 Segmental and somatic dysfunction of lumbar region: Secondary | ICD-10-CM | POA: Diagnosis not present

## 2019-07-12 DIAGNOSIS — M542 Cervicalgia: Secondary | ICD-10-CM | POA: Diagnosis not present

## 2019-07-26 DIAGNOSIS — M9904 Segmental and somatic dysfunction of sacral region: Secondary | ICD-10-CM | POA: Diagnosis not present

## 2019-07-26 DIAGNOSIS — M9903 Segmental and somatic dysfunction of lumbar region: Secondary | ICD-10-CM | POA: Diagnosis not present

## 2019-07-26 DIAGNOSIS — M545 Low back pain: Secondary | ICD-10-CM | POA: Diagnosis not present

## 2019-07-26 DIAGNOSIS — M542 Cervicalgia: Secondary | ICD-10-CM | POA: Diagnosis not present

## 2019-07-29 MED FILL — PANTOPRAZOLE SOD DR 40 MG T: 40 | 90 days supply | Qty: 90 | Fill #0

## 2019-08-05 DIAGNOSIS — M25561 Pain in right knee: Secondary | ICD-10-CM | POA: Diagnosis not present

## 2019-08-05 DIAGNOSIS — Z96652 Presence of left artificial knee joint: Secondary | ICD-10-CM | POA: Diagnosis not present

## 2019-08-08 ENCOUNTER — Telehealth: Payer: Self-pay | Admitting: Physical Medicine & Rehabilitation

## 2019-08-08 MED FILL — SHINGRIX 50 MCG SUS: 50 | 1 days supply | Qty: 1 | Fill #0

## 2019-08-08 NOTE — Telephone Encounter (Signed)
Okay to schedule Botox for headaches

## 2019-08-08 NOTE — Telephone Encounter (Signed)
Dr. Dia Crawford left a voice mail requesting botox injection for the afternoon of 11/10. He was last seen 1/20 for botox. Is it ok to schedule?

## 2019-08-09 DIAGNOSIS — M545 Low back pain: Secondary | ICD-10-CM | POA: Diagnosis not present

## 2019-08-09 DIAGNOSIS — M542 Cervicalgia: Secondary | ICD-10-CM | POA: Diagnosis not present

## 2019-08-09 DIAGNOSIS — M9904 Segmental and somatic dysfunction of sacral region: Secondary | ICD-10-CM | POA: Diagnosis not present

## 2019-08-09 DIAGNOSIS — M9903 Segmental and somatic dysfunction of lumbar region: Secondary | ICD-10-CM | POA: Diagnosis not present

## 2019-08-10 ENCOUNTER — Other Ambulatory Visit: Payer: Self-pay | Admitting: Orthopedic Surgery

## 2019-08-10 ENCOUNTER — Other Ambulatory Visit (HOSPITAL_COMMUNITY): Payer: Self-pay | Admitting: Orthopedic Surgery

## 2019-08-10 DIAGNOSIS — Z96652 Presence of left artificial knee joint: Secondary | ICD-10-CM

## 2019-08-10 NOTE — Telephone Encounter (Signed)
Appointment set for 08/23/2019

## 2019-08-15 DIAGNOSIS — M545 Low back pain: Secondary | ICD-10-CM | POA: Diagnosis not present

## 2019-08-23 ENCOUNTER — Encounter: Payer: Self-pay | Admitting: Physical Medicine & Rehabilitation

## 2019-08-23 ENCOUNTER — Other Ambulatory Visit: Payer: Self-pay

## 2019-08-23 ENCOUNTER — Encounter: Payer: 59 | Attending: Physical Medicine & Rehabilitation | Admitting: Physical Medicine & Rehabilitation

## 2019-08-23 VITALS — BP 127/88 | HR 90 | Temp 97.7°F | Ht 73.0 in | Wt 319.0 lb

## 2019-08-23 DIAGNOSIS — G43719 Chronic migraine without aura, intractable, without status migrainosus: Secondary | ICD-10-CM | POA: Insufficient documentation

## 2019-08-23 NOTE — Progress Notes (Signed)
Botox injection for chronic migraine prophylaxis.  Indication: History of migraines with greater than 15 headaches per month despite trials of oral medications.  Informed consent was obtained after discussing risks and benefits of the procedure with the patient this included bleeding bruising infection as well as facial drooping, eyelid lag, systemic effects of Botox. Patient elects to proceed and has given written consent.  Last Botox May 2019- headache frequency increased  Patient placed in a seated position Dilution 50 units per cc  Muscles injected with dose  Procerus 5 units Corrugator 5 units bilateral Frontalis 5 units 2 injection sites on the right and 2 injection sites on the left Temporalis 5 units in 4 injection sites on the right and 4 injection sites on the left Occipitalis 5 units into 3 injections sites on the right and 3 injection sites on the left  Cervical paraspinals 5 units into 2 injection sites on the right and 2 injection sites on the left trapezius 5 units into 3 injection sites on the right and 3 injection sites on the left bilateral levator scapula, 5 units bilaterally Patient tolerated procedure well. Post procedure instructions given.  Right L4 Paraspinal 10U Appointment for repeat injection , pt will call as he gets >6 mo relief from treatment  Wasted 25U

## 2019-08-23 NOTE — Patient Instructions (Signed)
Please call for appt  

## 2019-09-01 ENCOUNTER — Other Ambulatory Visit: Payer: Self-pay

## 2019-09-01 ENCOUNTER — Encounter (HOSPITAL_COMMUNITY)
Admission: RE | Admit: 2019-09-01 | Discharge: 2019-09-01 | Disposition: A | Discharge status: Home / Self Care | Payer: 59 | Source: Ambulatory Visit | Attending: Orthopedic Surgery | Admitting: Orthopedic Surgery

## 2019-09-01 DIAGNOSIS — Z96652 Presence of left artificial knee joint: Secondary | ICD-10-CM | POA: Insufficient documentation

## 2019-09-01 DIAGNOSIS — Z471 Aftercare following joint replacement surgery: Secondary | ICD-10-CM | POA: Diagnosis not present

## 2019-09-01 MED ORDER — TECHNETIUM TC 99M MEDRONATE IV KIT
20.0000 | PACK | Freq: Once | INTRAVENOUS | Status: AC | PRN
  Administered 2019-09-01: 20 via INTRAVENOUS

## 2019-09-02 MED FILL — ZOLPIDEM TARTRATE 10 MG TAB: 10 | 90 days supply | Qty: 90 | Fill #0

## 2019-09-02 MED FILL — diazePAM 5 MG TABS: 5 | 30 days supply | Qty: 60 | Fill #1

## 2019-09-02 MED FILL — TADALAFIL 5 MG TABS: 5 | 90 days supply | Qty: 90 | Fill #1

## 2019-09-02 MED FILL — CELECOXIB 200 MG CAPSULE: 200 | 90 days supply | Qty: 90 | Fill #0

## 2019-09-02 MED FILL — BUTALB-ACETAMIN-CAFF 50-325: 50-325-40 | 10 days supply | Qty: 60 | Fill #1

## 2019-09-02 MED FILL — TOPIRAMATE 25 MG TAB: 25 | 90 days supply | Qty: 90 | Fill #0

## 2019-09-02 MED FILL — PRAVASTATIN NA 40 MG TAB: 40 | 90 days supply | Qty: 90 | Fill #0

## 2019-09-05 DIAGNOSIS — M542 Cervicalgia: Secondary | ICD-10-CM | POA: Diagnosis not present

## 2019-09-05 DIAGNOSIS — M9903 Segmental and somatic dysfunction of lumbar region: Secondary | ICD-10-CM | POA: Diagnosis not present

## 2019-09-05 DIAGNOSIS — M9904 Segmental and somatic dysfunction of sacral region: Secondary | ICD-10-CM | POA: Diagnosis not present

## 2019-09-05 DIAGNOSIS — M545 Low back pain: Secondary | ICD-10-CM | POA: Diagnosis not present

## 2019-09-19 MED FILL — TAMSULOSIN HCL 0.4 MG CAP: 0.4 | 90 days supply | Qty: 180 | Fill #0

## 2019-10-03 MED FILL — diazePAM 5 MG TABS: 5 | 30 days supply | Qty: 60 | Fill #0

## 2019-10-03 MED FILL — oxyCODONE HCL 5 MG TABS: 5 | 30 days supply | Qty: 180 | Fill #0

## 2019-10-03 MED FILL — metFORMIN HCL 1000 MG TABS: 1000 | 90 days supply | Qty: 180 | Fill #0

## 2019-10-18 DIAGNOSIS — M545 Low back pain: Secondary | ICD-10-CM | POA: Diagnosis not present

## 2019-10-18 DIAGNOSIS — M542 Cervicalgia: Secondary | ICD-10-CM | POA: Diagnosis not present

## 2019-10-18 DIAGNOSIS — M546 Pain in thoracic spine: Secondary | ICD-10-CM | POA: Diagnosis not present

## 2019-10-18 DIAGNOSIS — M9903 Segmental and somatic dysfunction of lumbar region: Secondary | ICD-10-CM | POA: Diagnosis not present

## 2019-10-18 IMAGING — CR DG CHEST 2V
2 series · 2 of 2 positions shown · non-contrast
Comparison: Radiographs December 01, 2016.

CLINICAL DATA: Positive TB test.

EXAM:
CHEST  2 VIEW

[chest pa]
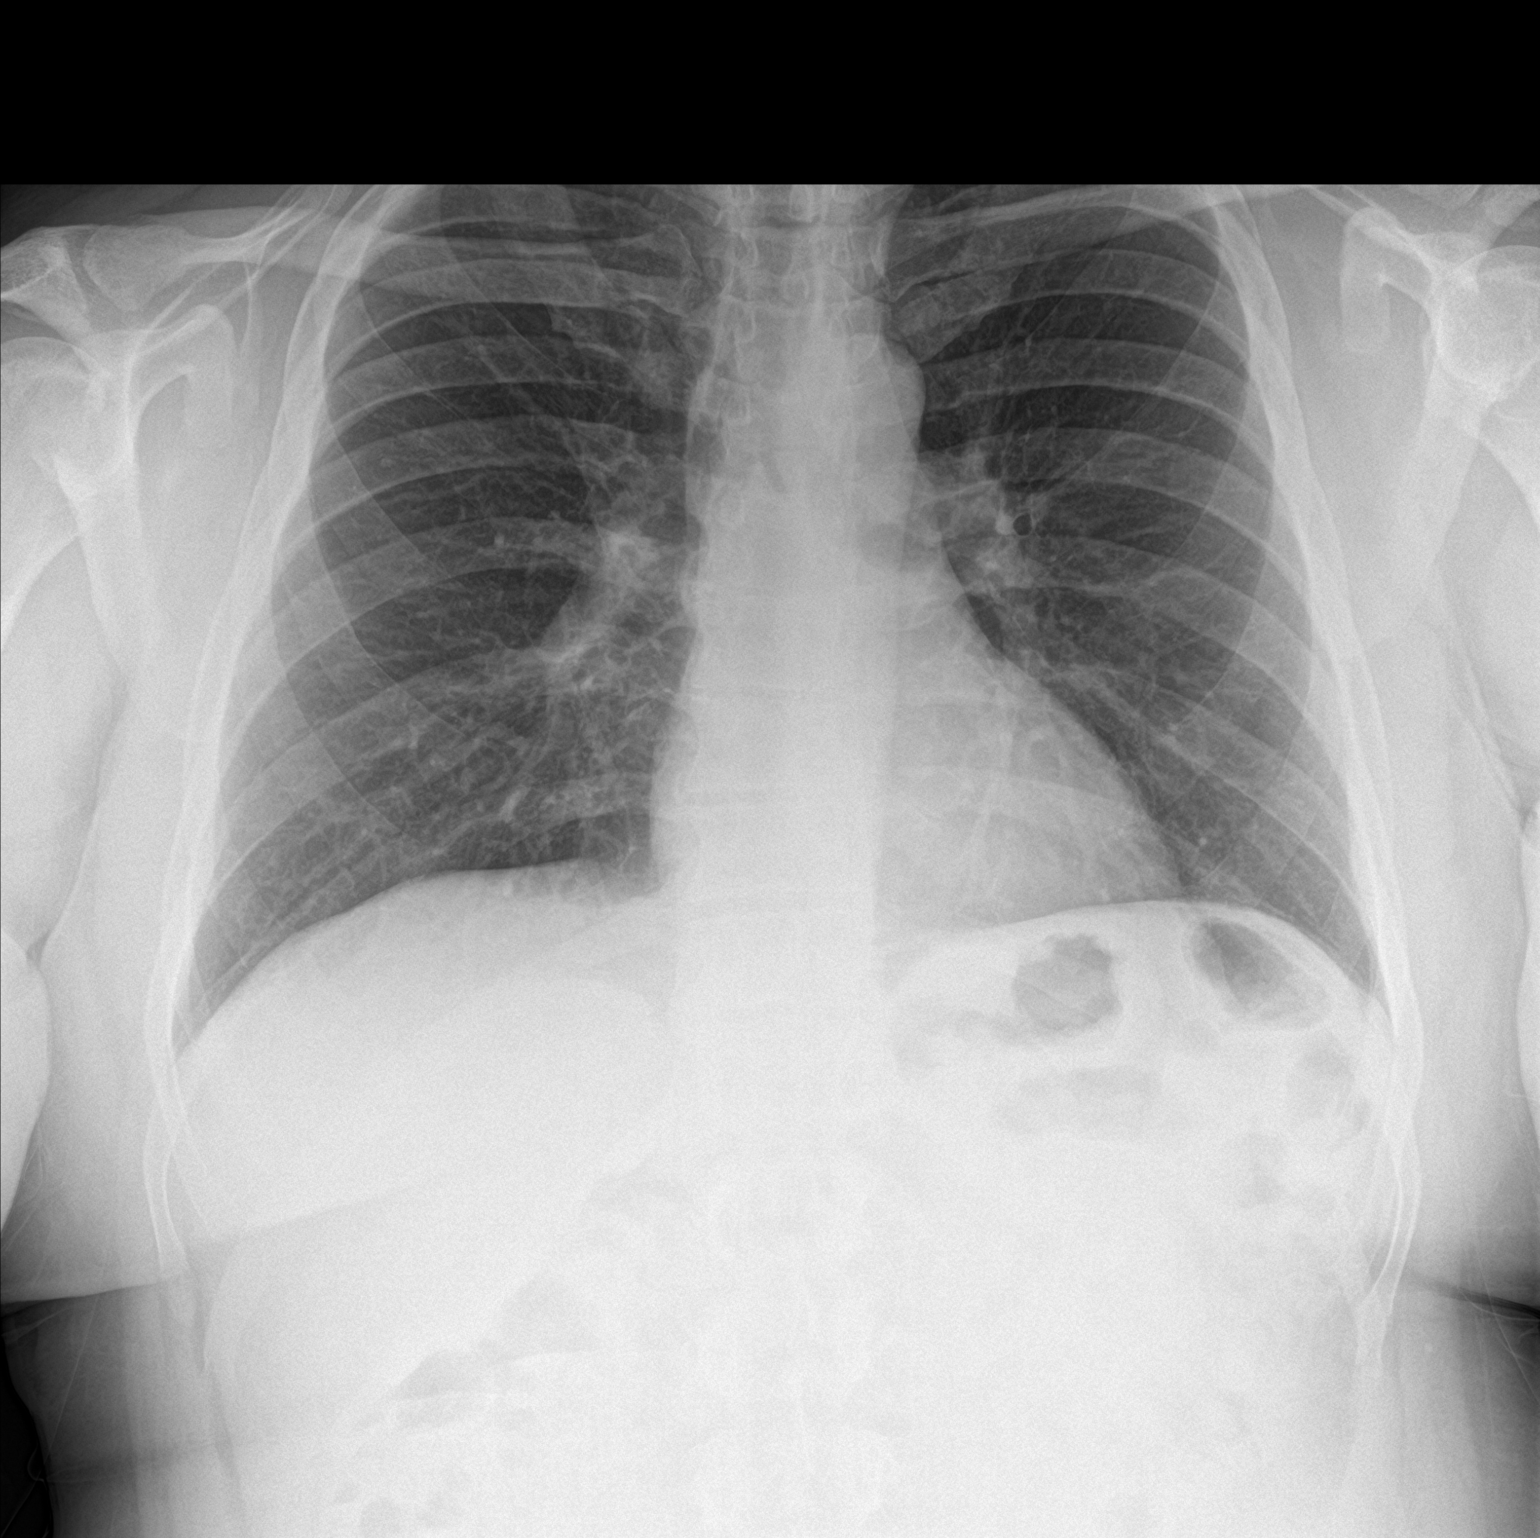

[chest lat]
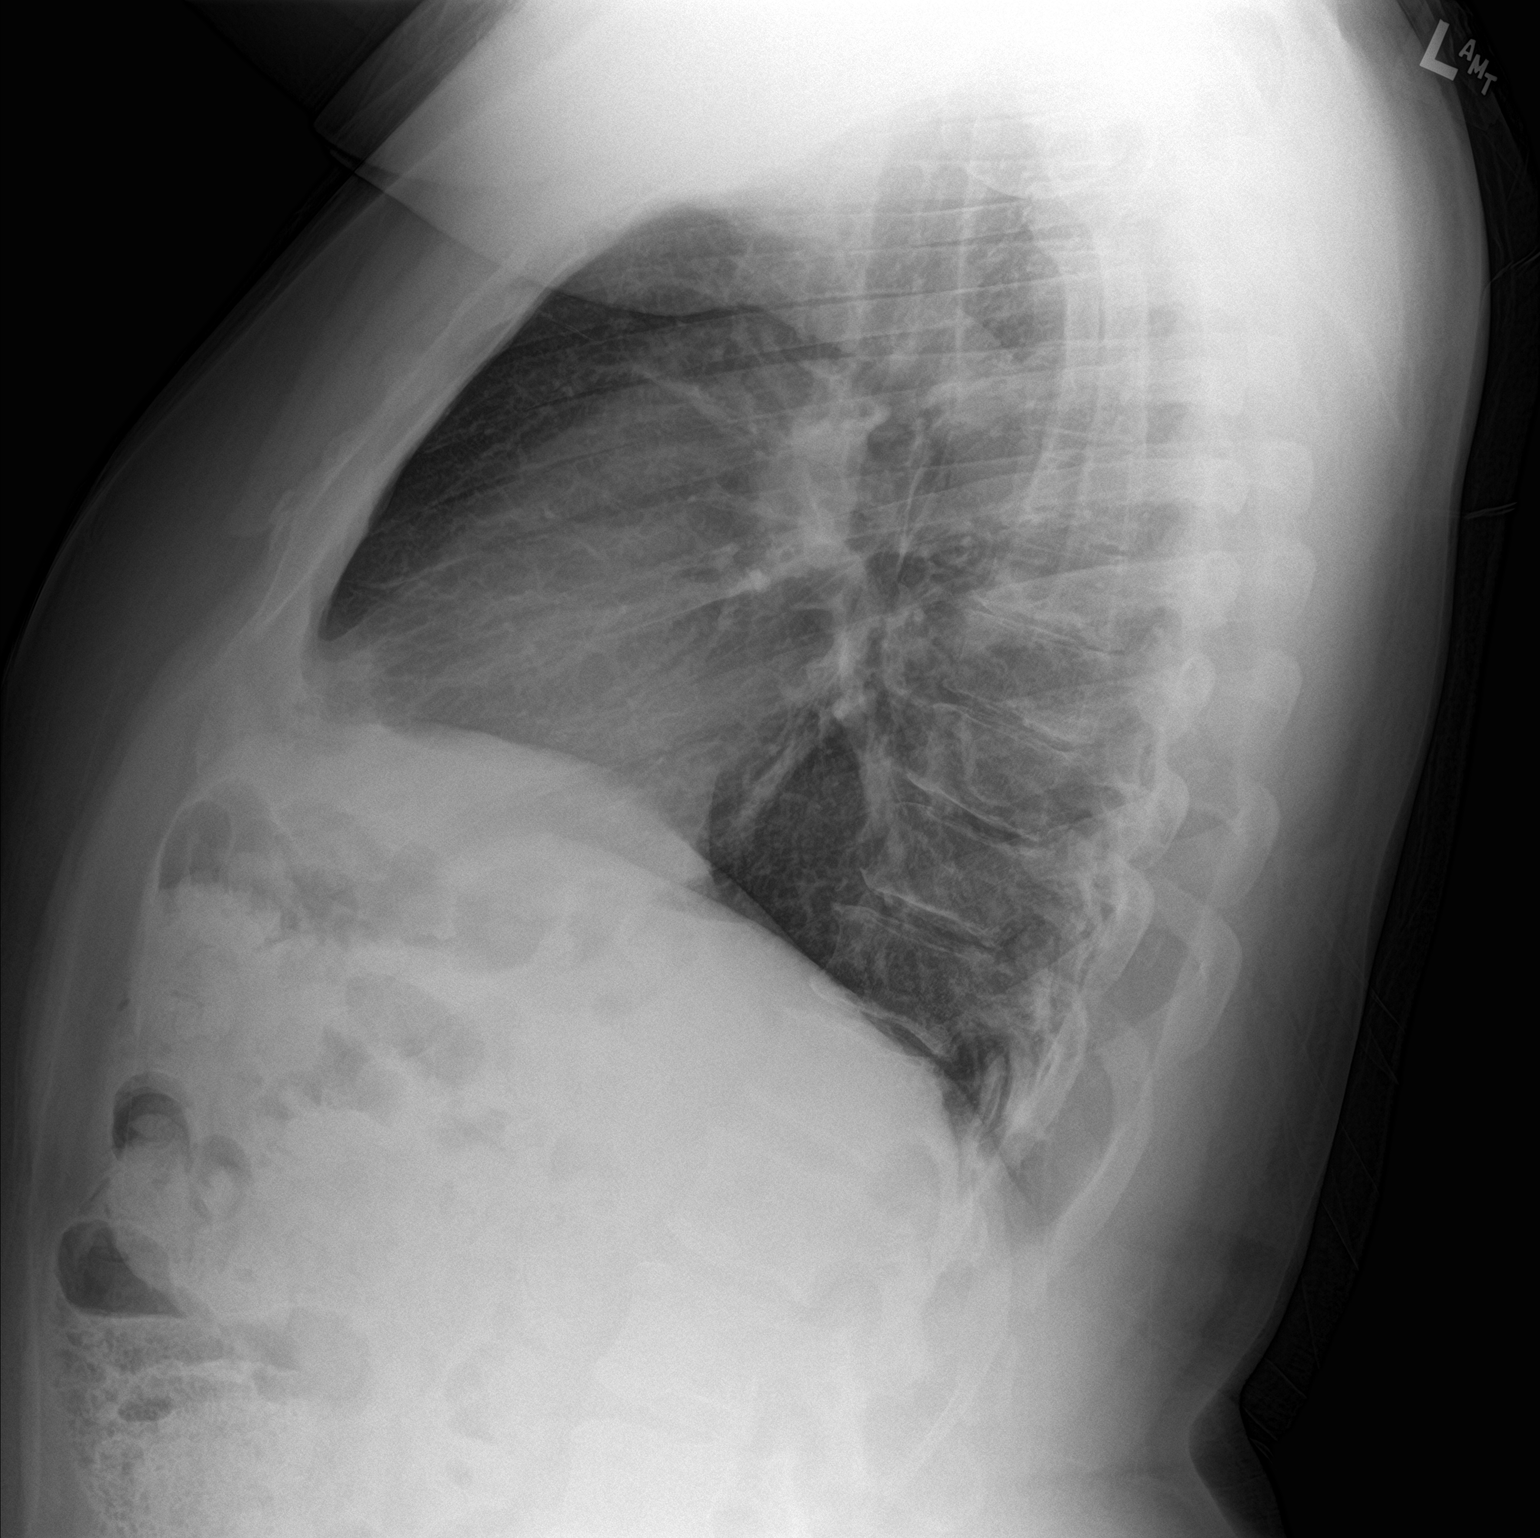

[2 of 2 positions shown; findings below may reference images not displayed]

FINDINGS: The heart size and mediastinal contours are within normal limits.
Both lungs are clear. The visualized skeletal structures are
unremarkable.
IMPRESSION: No active cardiopulmonary disease.

## 2019-10-26 MED FILL — KETOCONAZOLE 2% CREAM: 2 | 20 days supply | Qty: 30 | Fill #0

## 2019-10-26 MED FILL — BUTALB-ACETAMIN-CAFF 50-325: 50-325-40 | 10 days supply | Qty: 60 | Fill #0

## 2019-10-26 MED FILL — traMADol HCL 50 MG TABS: 50 | 30 days supply | Qty: 240 | Fill #0

## 2019-10-26 MED FILL — METHOCARBAMOL 500 MG TABS: 500 | 90 days supply | Qty: 270 | Fill #0

## 2019-10-27 DIAGNOSIS — M542 Cervicalgia: Secondary | ICD-10-CM | POA: Diagnosis not present

## 2019-10-27 DIAGNOSIS — M9903 Segmental and somatic dysfunction of lumbar region: Secondary | ICD-10-CM | POA: Diagnosis not present

## 2019-10-27 DIAGNOSIS — M545 Low back pain: Secondary | ICD-10-CM | POA: Diagnosis not present

## 2019-10-27 DIAGNOSIS — M546 Pain in thoracic spine: Secondary | ICD-10-CM | POA: Diagnosis not present

## 2019-10-28 DIAGNOSIS — M25562 Pain in left knee: Secondary | ICD-10-CM | POA: Diagnosis not present

## 2019-10-28 DIAGNOSIS — Z96652 Presence of left artificial knee joint: Secondary | ICD-10-CM | POA: Diagnosis not present

## 2019-10-31 DIAGNOSIS — M25511 Pain in right shoulder: Secondary | ICD-10-CM | POA: Diagnosis not present

## 2019-10-31 DIAGNOSIS — Z9889 Other specified postprocedural states: Secondary | ICD-10-CM | POA: Diagnosis not present

## 2019-10-31 MED FILL — GABAPENTIN 300 MG CAPSULE: 300 | 90 days supply | Qty: 270 | Fill #1

## 2019-10-31 MED FILL — BACLOFEN 10 MG TABS: 10 | 90 days supply | Qty: 360 | Fill #0

## 2019-11-10 DIAGNOSIS — M545 Low back pain: Secondary | ICD-10-CM | POA: Diagnosis not present

## 2019-11-10 DIAGNOSIS — M546 Pain in thoracic spine: Secondary | ICD-10-CM | POA: Diagnosis not present

## 2019-11-10 DIAGNOSIS — M9903 Segmental and somatic dysfunction of lumbar region: Secondary | ICD-10-CM | POA: Diagnosis not present

## 2019-11-10 DIAGNOSIS — M542 Cervicalgia: Secondary | ICD-10-CM | POA: Diagnosis not present

## 2019-11-14 MED FILL — LIDOCAINE PATCH 5%: 5 | 90 days supply | Qty: 180 | Fill #0

## 2019-11-14 MED FILL — diazePAM 5 MG TABS: 5 | 30 days supply | Qty: 60 | Fill #1

## 2019-11-29 DIAGNOSIS — M546 Pain in thoracic spine: Secondary | ICD-10-CM | POA: Diagnosis not present

## 2019-11-29 DIAGNOSIS — M9903 Segmental and somatic dysfunction of lumbar region: Secondary | ICD-10-CM | POA: Diagnosis not present

## 2019-11-29 DIAGNOSIS — M545 Low back pain: Secondary | ICD-10-CM | POA: Diagnosis not present

## 2019-11-29 DIAGNOSIS — M542 Cervicalgia: Secondary | ICD-10-CM | POA: Diagnosis not present

## 2019-12-12 MED FILL — diazePAM 5 MG TABS: 5 | 30 days supply | Qty: 60 | Fill #0

## 2019-12-12 MED FILL — TADALAFIL 5 MG TABS: 5 | 90 days supply | Qty: 90 | Fill #0

## 2019-12-12 MED FILL — PRAVASTATIN NA 40 MG TAB: 40 | 90 days supply | Qty: 90 | Fill #1

## 2019-12-12 MED FILL — ZOLPIDEM TARTRATE 10 MG TAB: 10 | 90 days supply | Qty: 90 | Fill #0

## 2019-12-16 MED FILL — TAMSULOSIN HCL 0.4 MG CAP: 0.4 | 90 days supply | Qty: 180 | Fill #1

## 2019-12-19 MED FILL — BUTALB-ACETAMIN-CAFF 50-325: 50-325-40 | 10 days supply | Qty: 60 | Fill #0

## 2019-12-22 DIAGNOSIS — M545 Low back pain: Secondary | ICD-10-CM | POA: Diagnosis not present

## 2019-12-22 DIAGNOSIS — M9903 Segmental and somatic dysfunction of lumbar region: Secondary | ICD-10-CM | POA: Diagnosis not present

## 2019-12-22 DIAGNOSIS — M542 Cervicalgia: Secondary | ICD-10-CM | POA: Diagnosis not present

## 2019-12-22 DIAGNOSIS — M546 Pain in thoracic spine: Secondary | ICD-10-CM | POA: Diagnosis not present

## 2019-12-22 MED FILL — oxyCODONE HCL 5 MG TABS: 5 | 30 days supply | Qty: 180 | Fill #0

## 2019-12-22 MED FILL — SHINGRIX 50 MCG SUS: 50 | 1 days supply | Qty: 1 | Fill #1

## 2019-12-29 DIAGNOSIS — M189 Osteoarthritis of first carpometacarpal joint, unspecified: Secondary | ICD-10-CM | POA: Diagnosis not present

## 2019-12-29 DIAGNOSIS — M1812 Unilateral primary osteoarthritis of first carpometacarpal joint, left hand: Secondary | ICD-10-CM | POA: Diagnosis not present

## 2020-01-10 DIAGNOSIS — M9903 Segmental and somatic dysfunction of lumbar region: Secondary | ICD-10-CM | POA: Diagnosis not present

## 2020-01-10 DIAGNOSIS — M546 Pain in thoracic spine: Secondary | ICD-10-CM | POA: Diagnosis not present

## 2020-01-10 DIAGNOSIS — M545 Low back pain: Secondary | ICD-10-CM | POA: Diagnosis not present

## 2020-01-10 DIAGNOSIS — M542 Cervicalgia: Secondary | ICD-10-CM | POA: Diagnosis not present

## 2020-01-20 ENCOUNTER — Other Ambulatory Visit (HOSPITAL_COMMUNITY): Payer: Self-pay | Admitting: Internal Medicine

## 2020-01-20 MED FILL — TOPIRAMATE 25 MG TAB: 25 | 90 days supply | Qty: 90 | Fill #1

## 2020-01-20 MED FILL — metFORMIN HCL 1000 MG TABS: 1000 | 90 days supply | Qty: 180 | Fill #1

## 2020-01-20 MED FILL — CYCLOBENZAPRINE HCL 10 MG T: 10 | 90 days supply | Qty: 270 | Fill #0

## 2020-01-23 ENCOUNTER — Ambulatory Visit: Payer: 59 | Attending: Specialist | Admitting: Physical Therapy

## 2020-01-23 ENCOUNTER — Encounter: Payer: Self-pay | Admitting: Physical Therapy

## 2020-01-23 ENCOUNTER — Other Ambulatory Visit: Payer: Self-pay

## 2020-01-23 DIAGNOSIS — M25511 Pain in right shoulder: Secondary | ICD-10-CM | POA: Diagnosis not present

## 2020-01-23 DIAGNOSIS — M25611 Stiffness of right shoulder, not elsewhere classified: Secondary | ICD-10-CM | POA: Insufficient documentation

## 2020-01-23 DIAGNOSIS — R293 Abnormal posture: Secondary | ICD-10-CM | POA: Insufficient documentation

## 2020-01-23 DIAGNOSIS — M542 Cervicalgia: Secondary | ICD-10-CM | POA: Diagnosis not present

## 2020-01-23 DIAGNOSIS — M6281 Muscle weakness (generalized): Secondary | ICD-10-CM | POA: Diagnosis not present

## 2020-01-23 DIAGNOSIS — G8929 Other chronic pain: Secondary | ICD-10-CM | POA: Diagnosis not present

## 2020-01-23 NOTE — Therapy (Signed)
[place], [place], [address] Phone: [phone]   Fax:  [phone]  Physical Therapy Evaluation  Patient Details  Name: Daniel STENMAN, MD MRN: [id number] Date of Birth: 1961-08-13 Referring Provider (PT): [name], MD   Encounter Date: 01/23/2020  PT End of Session - 01/23/20 1709    Visit Number  1    Number of Visits  22    Date for PT Re-Evaluation  04/06/20    Authorization Type  [employer]    PT Start Time  1630    PT Stop Time  1709    PT Time Calculation (min)  39 min    Activity Tolerance  Patient tolerated treatment well    Behavior During Therapy  [facility] for tasks assessed/performed       Past Medical History:  Diagnosis Date  . Anxiety   . Aortic atherosclerosis ([place])   . BPH (benign prostatic hyperplasia)   . Chronic pain syndrome    neck and low back  . DDD (degenerative disc disease), cervical   . Diverticulosis   . DJD (degenerative joint disease)   . Ganglion cyst    12 mm , right posterior knee  . Hyperlipidemia   . Internal hemorrhoids   . Migraines   . OA (osteoarthritis)    knees, hands, right shoulder  . Positive QuantiFERON-TB Gold test   . Shoulder pain   . Tear of right rotator cuff   . Type 2 diabetes mellitus ([place])    last A1c 5.4 on 04-13-2018 in epic (followed by pcp)  . Wears glasses     Past Surgical History:  Procedure Laterality Date  . ANAL FISSURE REPAIR    . ARTHOSCOPIC ROTAOR CUFF REPAIR Right 06/15/2018   Procedure: RIGHT SHOULDER ARTHROSCOPY, DEBRIDEMENT, SUBACROMIAL DECOMPRESSION, DISTAL CLAVICLE RESECTION, ROTATOR CUFF REPAIR;  Surgeon: [name], MD;  Location: [facility];  Service: Orthopedics;  Laterality: Right;  . COLONOSCOPY  05-26-2017   dr Henrene Pastor  . Hip Resurface  2006  . KNEE ARTHROSCOPY W/ MENISCAL REPAIR Bilateral right 1993; left 2010  . MASS EXCISION Right 04/21/2018   Procedure: EXCISION RIGHT THIGH SOFT  TISSUE MASS;  Surgeon: [name], MD;  [facility];  Service: Orthopedics;  Laterality: Right;  . SEPTOPLASTY    . SHOULDER ARTHROSCOPY WITH ROTATOR CUFF REPAIR Left   . SLAP Tear Repair  2008  . TONSILLECTOMY    . 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 TOTAL KNEE ARTHROPLASTY WITH REVISION COMPONENTS Left 04/21/2018   Procedure: Left knee polyethylene exchange ;  Surgeon: [name], MD;  [facility];  Service: Orthopedics;  Laterality: Left;    There were no vitals filed for this visit.   Subjective Assessment - 01/23/20 1632    Subjective  PT ended due to [place]. Incr supraspinatus and and cervical tightness with decr ROM. Having HA along Rt side occipital region. Occasional n/t in arm.    Currently in Pain?  Yes    Pain Score  5     Pain Orientation  Right    Aggravating Factors   end of work week    Pain Relieving Factors  rest, chiropractor/massage therapy         [facility] PT Assessment - 01/23/20 0001      Assessment   Medical Diagnosis  Rt shoulder pain    Referring Provider (PT)  [name], MD    Onset Date/Surgical Date  06/15/18    Hand Dominance  Right    Prior Therapy  yes      Precautions   Precautions  None      Restrictions   Weight Bearing Restrictions  No      Balance Screen   Has the patient fallen in the past 6 months  No      [place] residence      Prior Function   Level of Independence  Independent      Cognition   Overall Cognitive Status  Within Functional Limits for tasks assessed      Observation/Other Assessments   Focus on Therapeutic Outcomes (FOTO)   47% limited      Sensation   Additional Comments  occasional N/T when it gets really bad into UE      Posture/Postural Control   Posture Comments  rounded shoulders      ROM /  Strength   AROM / PROM / Strength  AROM;Strength      AROM   AROM Assessment Site  Cervical;Shoulder    Right/Left Shoulder  Right    Right Shoulder Flexion  138 Degrees    Right Shoulder ABduction  140 Degrees    Right Shoulder Internal Rotation  --   L1  pain   Right Shoulder External Rotation  --   T2, pain (more than IR)   Cervical Flexion  40    Cervical Extension  30   pain   Cervical - Right Side Bend  28   pain   Cervical - Left Side Bend  20    Cervical - Right Rotation  56    Cervical - Left Rotation  50   pain     Strength   Overall Strength Comments  gross Rt GHJ 4/5                Objective measurements completed on examination: See above findings.      [place] Adult PT Treatment/Exercise - 01/23/20 0001      Exercises   Exercises  Shoulder      Shoulder Exercises: Seated   Retraction Limitations  tactile cues required    External Rotation  20 reps;Theraband    Theraband Level (Shoulder External Rotation)  Level 3 (Green)    Other Seated Exercises  cervical rotation      Shoulder Exercises: Stretch   Other Shoulder Stretches  upper trap & levator stretches    Other Shoulder Stretches  thoracic extension in chair      Manual Therapy   Manual Therapy  Soft tissue mobilization    Soft tissue mobilization  IASTM Rt suboccipitals, upper traps, levator, scalenes             PT Education - 01/23/20 1727    Education Details  anatomy of condition, POC, HEP, exercise form/rationale    Person(s) Educated  Patient    Methods  Explanation;Demonstration;Tactile cues;Verbal cues;Handout    Comprehension  Verbalized understanding;Returned demonstration;Verbal cues required;Tactile cues required;Need further instruction       PT Short Term Goals - 01/23/20 1723      PT SHORT TERM GOAL #1   Title  Pt will be independent in postural correction    Baseline  cues for retraction at eval    Time  4    Period  Weeks  Status  New    Target Date   02/24/20      PT SHORT TERM GOAL #2   Title  pt will perform stretches at least 3 times daily  to be proactive about pain onset    Baseline  began educating at eval    Time  4    Period  Weeks    Status  New    Target Date  02/24/20        PT Long Term Goals - 01/23/20 1717      PT LONG TERM GOAL #1   Title  Equal cervical rotation Rt to Lt without discomfort    Baseline  see flowsheet    Time  10    Period  Weeks    Status  New    Target Date  04/06/20      PT LONG TERM GOAL #2   Title  gross GHJ strength 5/5    Baseline  see flowsheet    Time  10    Period  Weeks    Status  New    Target Date  04/06/20      PT LONG TERM GOAL #3   Title  pt will be able to complete a work week without onset of HA or N/T    Baseline  complains of this at this time    Time  10    Period  Weeks    Status  New    Target Date  04/06/20      PT LONG TERM GOAL #4   Title  pt will be able to carry and lift objects required for ADLs    Baseline  limited in [facility] reaching/lifting and avoids lifting    Time  10    Period  Weeks    Status  New    Target Date  04/06/20      PT LONG TERM GOAL #5   Title  Pt will be independent in long term HEP/gym program for continued strengthening    Baseline  will progress as appropriate    Time  10    Period  Weeks    Status  New    Target Date  04/06/20             Plan - 01/23/20 1710    Clinical Impression Statement  Pt presents to PT with complaints of Rt-sided cervical and shoulder pain that has increased gradually since ending PT at the beginning of the COVID-19 pandemic when the clinic closed prior to finishing his POC. Notable tightness in Rt cervical musculature as well as upper traps resulting in a slight resting Rt sidebend. Good strength in Rt shoulder but is notably limited vs Lt. 10 deg improvement in cervical rotation following manual therapy today with continued Rt lower cervical discomfort in Lt rotaiton. will benefit from skilled  PT in order to reduce spasm and return to high level strengthening exercises for Rt periscapular andGHJ regions.    Personal Factors and Comorbidities  Comorbidity 3+    Comorbidities  OA, h/o RCR, chronic pain    Examination-Activity Limitations  Bathing;Reach Overhead;Caring for Others;Sleep;Carry;Lift    Examination-Participation Restrictions  Other    Stability/Clinical Decision Making  Evolving/Moderate complexity    Clinical Decision Making  Moderate    Rehab Potential  Good    PT Frequency  2x / week    PT Duration  Other (comment)   10 weeks   PT Treatment/Interventions  ADLs/Self Care Home  Management;Cryotherapy;Electrical Stimulation;Ultrasound;Traction;Moist Heat;Iontophoresis 4mg /ml Dexamethasone;Therapeutic activities;Therapeutic exercise;Neuromuscular re-education;Manual techniques;Patient/family education;Passive range of motion;Dry needling;Taping;Joint Manipulations;Spinal Manipulations    PT Next Visit Plan  DN Rt upper traps/levator, suboccipitals; progress periscap activation, triceps    PT Home Exercise Plan  [id number]    Consulted and Agree with Plan of Care  Patient       Patient will benefit from skilled therapeutic intervention in order to improve the following deficits and impairments:  Decreased range of motion, Impaired UE functional use, Increased muscle spasms, Decreased activity tolerance, Pain, Impaired flexibility, Improper body mechanics, Decreased strength, Impaired sensation, Postural dysfunction  Visit Diagnosis: Cervicalgia - Plan: PT plan of care cert/re-cert  Chronic right shoulder pain - Plan: PT plan of care cert/re-cert  Abnormal posture - Plan: PT plan of care cert/re-cert  Muscle weakness (generalized) - Plan: PT plan of care cert/re-cert     Problem List Patient Active Problem List   Diagnosis Date Noted  . Right shoulder injury 06/15/2018  . S/P arthroscopy of right shoulder 06/15/2018  . Failed total knee arthroplasty ([place])  04/21/2018  . OA (osteoarthritis) of knee 12/08/2016  . 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 Cervical facet joint syndrome 12/17/2015  . Chronic bilateral low back pain without sciatica 12/17/2015  . Primary osteoarthritis of left knee 12/17/2015   Airabella Barley C. Dalon Reichart PT, DPT 01/23/20 5:40 PM   [facility] [address] [place], [place], [address] Phone: [phone]   Fax:  [phone]  Name: EYAD AHLERS, MD MRN: [id number] Date of Birth: 1961-09-27

## 2020-01-31 MED FILL — PANTOPRAZOLE SOD DR 40 MG T: 40 | 90 days supply | Qty: 90 | Fill #1

## 2020-01-31 MED FILL — diazePAM 5 MG TABS: 5 | 30 days supply | Qty: 60 | Fill #1

## 2020-02-01 MED FILL — BUTALB-ACETAMIN-CAFF 50-325: 50-325-40 | 10 days supply | Qty: 60 | Fill #0

## 2020-02-02 ENCOUNTER — Encounter: Payer: Self-pay | Admitting: Physical Therapy

## 2020-02-02 ENCOUNTER — Other Ambulatory Visit: Payer: Self-pay

## 2020-02-02 ENCOUNTER — Ambulatory Visit: Payer: 59 | Admitting: Physical Therapy

## 2020-02-02 DIAGNOSIS — M25611 Stiffness of right shoulder, not elsewhere classified: Secondary | ICD-10-CM | POA: Diagnosis not present

## 2020-02-02 DIAGNOSIS — M6281 Muscle weakness (generalized): Secondary | ICD-10-CM | POA: Diagnosis not present

## 2020-02-02 DIAGNOSIS — R293 Abnormal posture: Secondary | ICD-10-CM | POA: Diagnosis not present

## 2020-02-02 DIAGNOSIS — M25511 Pain in right shoulder: Secondary | ICD-10-CM | POA: Diagnosis not present

## 2020-02-02 DIAGNOSIS — M542 Cervicalgia: Secondary | ICD-10-CM | POA: Diagnosis not present

## 2020-02-02 DIAGNOSIS — G8929 Other chronic pain: Secondary | ICD-10-CM | POA: Diagnosis not present

## 2020-02-02 NOTE — Therapy (Signed)
[facility] [place], [place], [address] Phone: [phone]   Fax:  [phone]  Physical Therapy Treatment  Patient Details  Name: Daniel CLABORN, MD MRN: [id number] Date of Birth: [birth year] Referring Provider (PT): [name], MD   Encounter Date: 02/02/2020  PT End of Session - 02/02/20 1717    Visit Number  2    Number of Visits  22    Date for PT Re-Evaluation  04/06/20    Authorization Type  UMR/Tricare    PT Start Time  1632    PT Stop Time  1722    PT Time Calculation (min)  50 min    Activity Tolerance  Patient tolerated treatment well    Behavior During Therapy  [facility] for tasks assessed/performed       Past Medical History:  Diagnosis Date  . Anxiety   . Aortic atherosclerosis ([place])   . BPH (benign prostatic hyperplasia)   . Chronic pain syndrome    neck and low back  . DDD (degenerative disc disease), cervical   . Diverticulosis   . DJD (degenerative joint disease)   . Ganglion cyst    12 mm , right posterior knee  . Hyperlipidemia   . Internal hemorrhoids   . Migraines   . OA (osteoarthritis)    knees, hands, right shoulder  . Positive QuantiFERON-TB Gold test   . Shoulder pain   . Tear of right rotator cuff   . Type 2 diabetes mellitus ([place])    last A1c 5.4 on 04-13-2018 in epic (followed by pcp)  . Wears glasses     Past Surgical History:  Procedure Laterality Date  . ANAL FISSURE REPAIR    . ARTHOSCOPIC ROTAOR CUFF REPAIR Right 06/15/2018   Procedure: RIGHT SHOULDER ARTHROSCOPY, DEBRIDEMENT, SUBACROMIAL DECOMPRESSION, DISTAL CLAVICLE RESECTION, ROTATOR CUFF REPAIR;  Surgeon: [name], MD;  Location: [facility];  Service: Orthopedics;  Laterality: Right;  . COLONOSCOPY  05-26-2017   dr Henrene Pastor  . Hip Resurface  2006  . KNEE ARTHROSCOPY W/ MENISCAL REPAIR Bilateral right 1993; left 2010  . MASS EXCISION Right 04/21/2018   Procedure: EXCISION RIGHT THIGH SOFT  TISSUE MASS;  Surgeon: [name], MD;  [facility];  Service: Orthopedics;  Laterality: Right;  . SEPTOPLASTY    . SHOULDER ARTHROSCOPY WITH ROTATOR CUFF REPAIR Left   . SLAP Tear Repair  2008  . TONSILLECTOMY    . 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 TOTAL KNEE ARTHROPLASTY WITH REVISION COMPONENTS Left 04/21/2018   Procedure: Left knee polyethylene exchange ;  Surgeon: [name], MD;  [facility];  Service: Orthopedics;  Laterality: Left;    There were no vitals filed for this visit.      [facility] PT Assessment - 02/02/20 0001      AROM   Cervical - Right Side Bend  30    Cervical - Left Side Bend  30                   [facility] Adult PT Treatment/Exercise - 02/02/20 0001      Shoulder Exercises: Prone   Retraction  Right    Retraction Weight (lbs)  1    Flexion  Right;15 reps    Flexion Weight (lbs)  1    Extension  15 reps;Right  Extension Weight (lbs)  1    External Rotation  Right;10 reps    External Rotation Weight (lbs)  1    External Rotation Limitations  in 90 abd- elbow supported by PT    Horizontal ABduction 1  Right;15 reps    Horizontal ABduction 1 Weight (lbs)  1      Shoulder Exercises: ROM/Strengthening   UBE (Upper Arm Bike)  retro 3 min L3      Modalities   Modalities  Moist Heat      Moist Heat Therapy   Number Minutes Moist Heat  10 Minutes    Moist Heat Location  Cervical      Manual Therapy   Manual Therapy  Joint mobilization    Manual therapy comments  skilled palpation and monitoring during TPDN    Joint Mobilization  cervical lateral glides & traction    Soft tissue mobilization  bil upper traps             PT Education - 02/02/20 1730    Education Details  TPDN & expected outcomes    Person(s) Educated  Patient    Methods  Explanation    Comprehension  Verbalized  understanding;Need further instruction       PT Short Term Goals - 01/23/20 1723      PT SHORT TERM GOAL #1   Title  Pt will be independent in postural correction    Baseline  cues for retraction at eval    Time  4    Period  Weeks    Status  New    Target Date  02/24/20      PT SHORT TERM GOAL #2   Title  pt will perform stretches at least 3 times daily  to be proactive about pain onset    Baseline  began educating at eval    Time  4    Period  Weeks    Status  New    Target Date  02/24/20        PT Long Term Goals - 01/23/20 1717      PT LONG TERM GOAL #1   Title  Equal cervical rotation Rt to Lt without discomfort    Baseline  see flowsheet    Time  10    Period  Weeks    Status  New    Target Date  04/06/20      PT LONG TERM GOAL #2   Title  gross GHJ strength 5/5    Baseline  see flowsheet    Time  10    Period  Weeks    Status  New    Target Date  04/06/20      PT LONG TERM GOAL #3   Title  pt will be able to complete a work week without onset of HA or N/T    Baseline  complains of this at this time    Time  10    Period  Weeks    Status  New    Target Date  04/06/20      PT LONG TERM GOAL #4   Title  pt will be able to carry and lift objects required for ADLs    Baseline  limited in [facility] reaching/lifting and avoids lifting    Time  10    Period  Weeks    Status  New    Target Date  04/06/20      PT LONG TERM GOAL #5  Title  Pt will be independent in long term HEP/gym program for continued strengthening    Baseline  will progress as appropriate    Time  10    Period  Weeks    Status  New    Target Date  04/06/20            Plan - 02/02/20 1722    Clinical Impression Statement  TPDN added to treatment today to decrease upper trap tension followed by cervical mobilizations. Increased cervical sidebend to 30 deg from 20 at initial evaluation. notable lack of scapular mobility on Rt vs Lt. tactile cues required in prone for scapular  retraction during UE motions.    PT Treatment/Interventions  ADLs/Self Care Home Management;Cryotherapy;Electrical Stimulation;Ultrasound;Traction;Moist Heat;Iontophoresis 4mg /ml Dexamethasone;Therapeutic activities;Therapeutic exercise;Neuromuscular re-education;Manual techniques;Patient/family education;Passive range of motion;Dry needling;Taping;Joint Manipulations;Spinal Manipulations    PT Next Visit Plan  outcome of DN? progress prone    PT Home Exercise Plan  [id number]    Consulted and Agree with Plan of Care  Patient       Patient will benefit from skilled therapeutic intervention in order to improve the following deficits and impairments:  Decreased range of motion, Impaired UE functional use, Increased muscle spasms, Decreased activity tolerance, Pain, Impaired flexibility, Improper body mechanics, Decreased strength, Impaired sensation, Postural dysfunction  Visit Diagnosis: Cervicalgia  Chronic right shoulder pain  Abnormal posture  Muscle weakness (generalized)     Problem List Patient Active Problem List   Diagnosis Date Noted  . Right shoulder injury 06/15/2018  . S/P arthroscopy of right shoulder 06/15/2018  . Failed total knee arthroplasty ([place]) 04/21/2018  . OA (osteoarthritis) of knee 12/08/2016  . 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 Cervical facet joint syndrome 12/17/2015  . Chronic bilateral low back pain without sciatica 12/17/2015  . Primary osteoarthritis of left knee 12/17/2015    Nikitia Asbill C. Areg Bialas PT, DPT 02/02/20 5:31 PM   [facility] [address] [place], [place], [address] Phone: [phone]   Fax:  [phone]  Name: Daniel REIM, MD MRN: [id number] Date of Birth: Mar 02, 1961

## 2020-02-03 MED FILL — GABAPENTIN 300 MG CAPSULE: 300 | 90 days supply | Qty: 180 | Fill #1

## 2020-02-06 ENCOUNTER — Other Ambulatory Visit: Payer: Self-pay

## 2020-02-06 ENCOUNTER — Encounter: Payer: Self-pay | Admitting: Physical Therapy

## 2020-02-06 ENCOUNTER — Ambulatory Visit: Payer: 59 | Admitting: Physical Therapy

## 2020-02-06 DIAGNOSIS — G8929 Other chronic pain: Secondary | ICD-10-CM | POA: Diagnosis not present

## 2020-02-06 DIAGNOSIS — M542 Cervicalgia: Secondary | ICD-10-CM | POA: Diagnosis not present

## 2020-02-06 DIAGNOSIS — R293 Abnormal posture: Secondary | ICD-10-CM

## 2020-02-06 DIAGNOSIS — M25511 Pain in right shoulder: Secondary | ICD-10-CM

## 2020-02-06 DIAGNOSIS — M6281 Muscle weakness (generalized): Secondary | ICD-10-CM | POA: Diagnosis not present

## 2020-02-06 DIAGNOSIS — M546 Pain in thoracic spine: Secondary | ICD-10-CM | POA: Diagnosis not present

## 2020-02-06 DIAGNOSIS — M545 Low back pain: Secondary | ICD-10-CM | POA: Diagnosis not present

## 2020-02-06 DIAGNOSIS — M25611 Stiffness of right shoulder, not elsewhere classified: Secondary | ICD-10-CM | POA: Diagnosis not present

## 2020-02-06 DIAGNOSIS — M9903 Segmental and somatic dysfunction of lumbar region: Secondary | ICD-10-CM | POA: Diagnosis not present

## 2020-02-06 NOTE — Therapy (Signed)
[place], [place], [address] Phone: [phone]   Fax:  [phone]  Physical Therapy Treatment  Patient Details  Name: Daniel CLEERE, MD MRN: [id number] Date of Birth: September 04, 1961 Referring Provider (PT): [name], MD   Encounter Date: 02/06/2020  PT End of Session - 02/06/20 1702    Visit Number  3    Number of Visits  22    Date for PT Re-Evaluation  04/06/20    Authorization Type  UMR/[employer]    PT Start Time  1632    PT Stop Time  1711    PT Time Calculation (min)  39 min    Activity Tolerance  Patient tolerated treatment well    Behavior During Therapy  [facility] for tasks assessed/performed       Past Medical History:  Diagnosis Date  . Anxiety   . Aortic atherosclerosis ([place])   . BPH (benign prostatic hyperplasia)   . Chronic pain syndrome    neck and low back  . DDD (degenerative disc disease), cervical   . Diverticulosis   . DJD (degenerative joint disease)   . Ganglion cyst    12 mm , right posterior knee  . Hyperlipidemia   . Internal hemorrhoids   . Migraines   . OA (osteoarthritis)    knees, hands, right shoulder  . Positive QuantiFERON-TB Gold test   . Shoulder pain   . Tear of right rotator cuff   . Type 2 diabetes mellitus ([place])    last A1c 5.4 on 04-13-2018 in epic (followed by pcp)  . Wears glasses     Past Surgical History:  Procedure Laterality Date  . ANAL FISSURE REPAIR    . ARTHOSCOPIC ROTAOR CUFF REPAIR Right 06/15/2018   Procedure: RIGHT SHOULDER ARTHROSCOPY, DEBRIDEMENT, SUBACROMIAL DECOMPRESSION, DISTAL CLAVICLE RESECTION, ROTATOR CUFF REPAIR;  Surgeon: [name], MD;  Location: [facility];  Service: Orthopedics;  Laterality: Right;  . COLONOSCOPY  05-26-2017   dr Henrene Pastor  . Hip Resurface  2006  . KNEE ARTHROSCOPY W/ MENISCAL REPAIR Bilateral right 1993; left 2010  . MASS EXCISION Right 04/21/2018   Procedure: EXCISION RIGHT THIGH SOFT  TISSUE MASS;  Surgeon: [name], MD;  [facility];  Service: Orthopedics;  Laterality: Right;  . SEPTOPLASTY    . SHOULDER ARTHROSCOPY WITH ROTATOR CUFF REPAIR Left   . SLAP Tear Repair  2008  . TONSILLECTOMY    . 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 TOTAL KNEE ARTHROPLASTY WITH REVISION COMPONENTS Left 04/21/2018   Procedure: Left knee polyethylene exchange ;  Surgeon: [name], MD;  [facility];  Service: Orthopedics;  Laterality: Left;    There were no vitals filed for this visit.  Subjective Assessment - 02/06/20 1659    Subjective  Patient reports a 3/10 pain. He indicated that the DN helped a lot. He also reported that he has not had any HA, only some soreness.    Currently in Pain?  Yes    Pain Score  3     Pain Orientation  Right    Pain Type  Surgical pain;Chronic pain    Pain Onset  More than a month ago    Pain Frequency  Constant    Aggravating Factors   end of work week    Pain Relieving Factors  rest, chiropractor/massage  therapy                       [facility] Adult PT Treatment/Exercise - 02/06/20 0001      Exercises   Exercises  Shoulder;Neck      Neck Exercises: Theraband   Shoulder External Rotation  Blue;20 reps   3 sets of 10   Shoulder Internal Rotation  20 reps;Blue   3 sets of 10   Other Theraband Exercises  Rows w/ scapular pinch, Blue TheraBand       Shoulder Exercises: Supine   Other Supine Exercises  Serratus Punches (protraction)   2 lbs, 3 sets of 10    Other Supine Exercises  Rhythmic stabilization    3 sets, 20 secs      Shoulder Exercises: Prone   Other Prone Exercises  Prone Ys   3 sets of 10, no weight    Other Prone Exercises  Prone Ts   3 sets of 10, no weights      Shoulder Exercises: ROM/Strengthening   UBE (Upper Arm Bike)  forward and retro 4 min L3       Manual Therapy   Manual Therapy  Soft tissue mobilization    Manual therapy comments  skilled palpation and monitoring during TPDN      Neck Exercises: Stretches   Upper Trapezius Stretch  Right;Left;2 reps;20 seconds    Levator Stretch  Right;Left;2 reps;20 seconds       Trigger Point Dry Needling - 02/06/20 0001    Consent Given?  Yes    Education Handout Provided  Previously provided    Muscles Treated Head and Neck  Upper trapezius    Upper Trapezius Response  Palpable increased muscle length             PT Short Term Goals - 01/23/20 1723      PT SHORT TERM GOAL #1   Title  Pt will be independent in postural correction    Baseline  cues for retraction at eval    Time  4    Period  Weeks    Status  New    Target Date  02/24/20      PT SHORT TERM GOAL #2   Title  pt will perform stretches at least 3 times daily  to be proactive about pain onset    Baseline  began educating at eval    Time  4    Period  Weeks    Status  New    Target Date  02/24/20        PT Long Term Goals - 01/23/20 1717      PT LONG TERM GOAL #1   Title  Equal cervical rotation Rt to Lt without discomfort    Baseline  see flowsheet    Time  10    Period  Weeks    Status  New    Target Date  04/06/20      PT LONG TERM GOAL #2   Title  gross GHJ strength 5/5    Baseline  see flowsheet    Time  10    Period  Weeks    Status  New    Target Date  04/06/20      PT LONG TERM GOAL #3   Title  pt will be able to complete a work week without onset of HA or N/T    Baseline  complains of this at this time    Time  10    Period  Weeks    Status  New    Target Date  04/06/20      PT LONG TERM GOAL #4   Title  pt will be able to carry and lift objects required for ADLs    Baseline  limited in [facility] reaching/lifting and avoids lifting    Time  10    Period  Weeks    Status  New    Target Date  04/06/20      PT LONG TERM GOAL #5   Title  Pt will be independent in long term HEP/gym  program for continued strengthening    Baseline  will progress as appropriate    Time  10    Period  Weeks    Status  New    Target Date  04/06/20            Plan - 02/06/20 1703    Clinical Impression Statement  Patient states that he responded well to the TPDN and reports no HA. TPDN and soft tissue mobs were performed on the Rt. upper trap. Todays session foocused on strengthening the lower/middle traps, and deltoids. He responded well to the exercises and reported that he felt good after the session. He would benefit from PT to further address pain and strength deficits.    PT Treatment/Interventions  ADLs/Self Care Home Management;Cryotherapy;Electrical Stimulation;Ultrasound;Traction;Moist Heat;Iontophoresis 4mg /ml Dexamethasone;Therapeutic activities;Therapeutic exercise;Neuromuscular re-education;Manual techniques;Patient/family education;Passive range of motion;Dry needling;Taping;Joint Manipulations;Spinal Manipulations    PT Next Visit Plan  Progress prone, Lower trap strengthening    PT Home Exercise Plan  [id number]    Consulted and Agree with Plan of Care  Patient       Patient will benefit from skilled therapeutic intervention in order to improve the following deficits and impairments:  Decreased range of motion, Impaired UE functional use, Increased muscle spasms, Decreased activity tolerance, Pain, Impaired flexibility, Improper body mechanics, Decreased strength, Impaired sensation, Postural dysfunction  Visit Diagnosis: Cervicalgia  Chronic right shoulder pain  Muscle weakness (generalized)  Abnormal posture  Stiffness of right shoulder, not elsewhere classified  Acute pain of right shoulder     Problem List Patient Active Problem List   Diagnosis Date Noted  . Right shoulder injury 06/15/2018  . S/P arthroscopy of right shoulder 06/15/2018  . 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 OA (osteoarthritis) of knee 12/08/2016  . Intractable  chronic migraine without aura and without status migrainosus 12/17/2015  . Cervical facet joint syndrome 12/17/2015  . Chronic bilateral low back pain without sciatica 12/17/2015  . Primary osteoarthritis of left knee 12/17/2015    Laveda Norman, SPT 02/06/2020, 5:21 PM  [facility] [address] [place], [place], [address] Phone: [phone]   Fax:  [phone]  Name: Daniel DOSSER, MD MRN: [id number] Date of Birth: 12-Jun-1961

## 2020-02-14 DIAGNOSIS — M546 Pain in thoracic spine: Secondary | ICD-10-CM | POA: Diagnosis not present

## 2020-02-14 DIAGNOSIS — M9903 Segmental and somatic dysfunction of lumbar region: Secondary | ICD-10-CM | POA: Diagnosis not present

## 2020-02-14 DIAGNOSIS — M545 Low back pain: Secondary | ICD-10-CM | POA: Diagnosis not present

## 2020-02-14 DIAGNOSIS — M542 Cervicalgia: Secondary | ICD-10-CM | POA: Diagnosis not present

## 2020-02-20 DIAGNOSIS — M545 Low back pain: Secondary | ICD-10-CM | POA: Diagnosis not present

## 2020-02-20 DIAGNOSIS — M542 Cervicalgia: Secondary | ICD-10-CM | POA: Diagnosis not present

## 2020-02-20 DIAGNOSIS — M9903 Segmental and somatic dysfunction of lumbar region: Secondary | ICD-10-CM | POA: Diagnosis not present

## 2020-02-20 DIAGNOSIS — M546 Pain in thoracic spine: Secondary | ICD-10-CM | POA: Diagnosis not present

## 2020-03-01 ENCOUNTER — Encounter: Payer: Self-pay | Admitting: Physical Therapy

## 2020-03-01 ENCOUNTER — Other Ambulatory Visit: Payer: Self-pay

## 2020-03-01 ENCOUNTER — Ambulatory Visit: Payer: 59 | Attending: Specialist | Admitting: Physical Therapy

## 2020-03-01 DIAGNOSIS — M6281 Muscle weakness (generalized): Secondary | ICD-10-CM | POA: Diagnosis not present

## 2020-03-01 DIAGNOSIS — M25511 Pain in right shoulder: Secondary | ICD-10-CM | POA: Diagnosis not present

## 2020-03-01 DIAGNOSIS — G8929 Other chronic pain: Secondary | ICD-10-CM | POA: Insufficient documentation

## 2020-03-01 DIAGNOSIS — M542 Cervicalgia: Secondary | ICD-10-CM | POA: Insufficient documentation

## 2020-03-01 DIAGNOSIS — Z96652 Presence of left artificial knee joint: Secondary | ICD-10-CM | POA: Diagnosis not present

## 2020-03-01 NOTE — Therapy (Signed)
[place], [place], [address] Phone: [phone]   Fax:  [phone]  Physical Therapy Treatment  Patient Details  Name: Daniel OMARY, [name] MRN: [id number] Date of Birth: 08/30/1961 Referring Provider (PT): [name], [name]   Encounter Date: 03/01/2020  PT End of Session - 03/01/20 1726    Visit Number  4    Number of Visits  22    Date for PT Re-Evaluation  04/06/20    Authorization Type  UMR/Tricare    PT Start Time  1713    PT Stop Time  1752    PT Time Calculation (min)  39 min    Activity Tolerance  Patient tolerated treatment well    Behavior During Therapy  [facility] for tasks assessed/performed       Past Medical History:  Diagnosis Date  . Anxiety   . Aortic atherosclerosis ([place])   . BPH (benign prostatic hyperplasia)   . Chronic pain syndrome    neck and low back  . DDD (degenerative disc disease), cervical   . Diverticulosis   . DJD (degenerative joint disease)   . Ganglion cyst    12 mm , right posterior knee  . Hyperlipidemia   . Internal hemorrhoids   . Migraines   . OA (osteoarthritis)    knees, hands, right shoulder  . Positive QuantiFERON-TB Gold test   . Shoulder pain   . Tear of right rotator cuff   . Type 2 diabetes mellitus ([place])    last A1c 5.4 on 04-13-2018 in epic (followed by pcp)  . Wears glasses     Past Surgical History:  Procedure Laterality Date  . ANAL FISSURE REPAIR    . ARTHOSCOPIC ROTAOR CUFF REPAIR Right 06/15/2018   Procedure: RIGHT SHOULDER ARTHROSCOPY, DEBRIDEMENT, SUBACROMIAL DECOMPRESSION, DISTAL CLAVICLE RESECTION, ROTATOR CUFF REPAIR;  Surgeon: [name], [name];  Location: [facility];  Service: Orthopedics;  Laterality: Right;  . COLONOSCOPY  05-26-2017   dr Henrene Pastor  . Hip Resurface  2006  . KNEE ARTHROSCOPY W/ MENISCAL REPAIR Bilateral right 1993; left 2010  . MASS EXCISION Right 04/21/2018   Procedure: EXCISION RIGHT THIGH SOFT  TISSUE MASS;  Surgeon: [name], [name];  [facility];  Service: Orthopedics;  Laterality: Right;  . SEPTOPLASTY    . SHOULDER ARTHROSCOPY WITH ROTATOR CUFF REPAIR Left   . SLAP Tear Repair  2008  . TONSILLECTOMY    . TOTAL KNEE ARTHROPLASTY Left 12/08/2016   Procedure: LEFT TOTAL KNEE ARTHROPLASTY;  Surgeon: [name], [name];  [facility];  Service: Orthopedics;  Laterality: Left;  . TOTAL KNEE ARTHROPLASTY WITH REVISION COMPONENTS Left 04/21/2018   Procedure: Left knee polyethylene exchange ;  Surgeon: [name], [name];  [facility];  Service: Orthopedics;  Laterality: Left;    There were no vitals filed for this visit.  Subjective Assessment - 03/01/20 1725    Subjective  Looser than it was.                        [facility] Adult PT Treatment/Exercise - 03/01/20 0001      Shoulder Exercises: Standing   Flexion Limitations  Y with 2lb back to wall    Other Standing Exercises  yellow plyball on wall circles 90, 120, 150    Other Standing Exercises  wall walks with wide & narrow push ups      Shoulder Exercises: Stretch   Wall Stretch -  Flexion  3 reps    Other Shoulder Stretches  door stretch      Manual Therapy   Manual therapy comments  skilled palpation and monitoring during TPDN    Soft tissue mobilization  Rt upper traps, supra&infra spinatus      Neck Exercises: Stretches   Upper Trapezius Stretch  2 reps    Levator Stretch  2 reps       Trigger Point Dry Needling - 03/01/20 0001    Muscles Treated Upper Quadrant  Infraspinatus;Supraspinatus    Upper Trapezius Response  Twitch reponse elicited;Palpable increased muscle length   Right   Supraspinatus Response  Twitch response elicited;Palpable increased muscle length   right   Infraspinatus Response  Twitch response elicited;Palpable increased muscle length   Right            PT Short Term Goals - 01/23/20 1723      PT SHORT TERM GOAL #1   Title  Pt will be independent in  postural correction    Baseline  cues for retraction at eval    Time  4    Period  Weeks    Status  New    Target Date  02/24/20      PT SHORT TERM GOAL #2   Title  pt will perform stretches at least 3 times daily  to be proactive about pain onset    Baseline  began educating at eval    Time  4    Period  Weeks    Status  New    Target Date  02/24/20        PT Long Term Goals - 01/23/20 1717      PT LONG TERM GOAL #1   Title  Equal cervical rotation Rt to Lt without discomfort    Baseline  see flowsheet    Time  10    Period  Weeks    Status  New    Target Date  04/06/20      PT LONG TERM GOAL #2   Title  gross GHJ strength 5/5    Baseline  see flowsheet    Time  10    Period  Weeks    Status  New    Target Date  04/06/20      PT LONG TERM GOAL #3   Title  pt will be able to complete a work week without onset of HA or N/T    Baseline  complains of this at this time    Time  10    Period  Weeks    Status  New    Target Date  04/06/20      PT LONG TERM GOAL #4   Title  pt will be able to carry and lift objects required for ADLs    Baseline  limited in [facility] reaching/lifting and avoids lifting    Time  10    Period  Weeks    Status  New    Target Date  04/06/20      PT LONG TERM GOAL #5   Title  Pt will be independent in long term HEP/gym program for continued strengthening    Baseline  will progress as appropriate    Time  10    Period  Weeks    Status  New    Target Date  04/06/20            Plan - 03/01/20 1755    Clinical Impression Statement  Good response to DN with decreased tension. Still feels that he is weak in motions above 90 and finds himself leaning to compensate. Significant improvement in scapular control with elevation only noted with fatigue.    PT Treatment/Interventions  ADLs/Self Care Home Management;Cryotherapy;Electrical Stimulation;Ultrasound;Traction;Moist Heat;Iontophoresis 4mg /ml Dexamethasone;Therapeutic activities;Therapeutic  exercise;Neuromuscular re-education;Manual techniques;Patient/family education;Passive range of motion;Dry needling;Taping;Joint Manipulations;Spinal Manipulations    PT Next Visit Plan  progress OH strength    PT Home Exercise Plan  [id number]    Consulted and Agree with Plan of Care  Patient       Patient will benefit from skilled therapeutic intervention in order to improve the following deficits and impairments:  Decreased range of motion, Impaired UE functional use, Increased muscle spasms, Decreased activity tolerance, Pain, Impaired flexibility, Improper body mechanics, Decreased strength, Impaired sensation, Postural dysfunction  Visit Diagnosis: Cervicalgia  Chronic right shoulder pain  Muscle weakness (generalized)     Problem List Patient Active Problem List   Diagnosis Date Noted  . Right shoulder injury 06/15/2018  . S/P arthroscopy of right shoulder 06/15/2018  . 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 OA (osteoarthritis) of knee 12/08/2016  . 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 Cervical facet joint syndrome 12/17/2015  . Chronic bilateral low back pain without sciatica 12/17/2015  . Primary osteoarthritis of left knee 12/17/2015    Adlene Adduci C. Constance Whittle PT, DPT 03/01/20 5:57 PM   [facility] [address] [place], [place], [address] Phone: [phone]   Fax:  [phone]  Name: Daniel MANGIAPANE, [name] MRN: [id number] Date of Birth: May 28, 1961

## 2020-03-15 ENCOUNTER — Other Ambulatory Visit: Payer: Self-pay

## 2020-03-15 ENCOUNTER — Encounter: Payer: Self-pay | Admitting: Physical Therapy

## 2020-03-15 ENCOUNTER — Ambulatory Visit: Payer: 59 | Attending: Specialist | Admitting: Physical Therapy

## 2020-03-15 DIAGNOSIS — M542 Cervicalgia: Secondary | ICD-10-CM | POA: Diagnosis not present

## 2020-03-15 DIAGNOSIS — M6281 Muscle weakness (generalized): Secondary | ICD-10-CM

## 2020-03-15 DIAGNOSIS — G8929 Other chronic pain: Secondary | ICD-10-CM

## 2020-03-15 DIAGNOSIS — M25511 Pain in right shoulder: Secondary | ICD-10-CM | POA: Diagnosis not present

## 2020-03-15 NOTE — Therapy (Signed)
[place], [place], [address] Phone: [phone]   Fax:  [phone]  Physical Therapy Treatment  Patient Details  Name: Daniel BROGAN, [name] MRN: [id number] Date of Birth: 03/08/1961 Referring Provider (PT): [name], [name]   Encounter Date: 03/15/2020  PT End of Session - 03/15/20 1449    Visit Number  5    Number of Visits  22    Date for PT Re-Evaluation  04/06/20    Authorization Type  UMR/[employer]    PT Start Time  1417    PT Stop Time  1457    PT Time Calculation (min)  40 min    Activity Tolerance  Patient tolerated treatment well    Behavior During Therapy  [facility] for tasks assessed/performed       Past Medical History:  Diagnosis Date  . Anxiety   . Aortic atherosclerosis ([place])   . BPH (benign prostatic hyperplasia)   . Chronic pain syndrome    neck and low back  . DDD (degenerative disc disease), cervical   . Diverticulosis   . DJD (degenerative joint disease)   . Ganglion cyst    12 mm , right posterior knee  . Hyperlipidemia   . Internal hemorrhoids   . Migraines   . OA (osteoarthritis)    knees, hands, right shoulder  . Positive QuantiFERON-TB Gold test   . Shoulder pain   . Tear of right rotator cuff   . Type 2 diabetes mellitus ([place])    last A1c 5.4 on 04-13-2018 in epic (followed by pcp)  . Wears glasses     Past Surgical History:  Procedure Laterality Date  . ANAL FISSURE REPAIR    . ARTHOSCOPIC ROTAOR CUFF REPAIR Right 06/15/2018   Procedure: RIGHT SHOULDER ARTHROSCOPY, DEBRIDEMENT, SUBACROMIAL DECOMPRESSION, DISTAL CLAVICLE RESECTION, ROTATOR CUFF REPAIR;  Surgeon: [name], [name];  Location: [place];  Service: Orthopedics;  Laterality: Right;  . COLONOSCOPY  05-26-2017   dr Henrene Pastor  . Hip Resurface  2006  . KNEE ARTHROSCOPY W/ MENISCAL REPAIR Bilateral right 1993; left 2010  . MASS EXCISION Right 04/21/2018   Procedure: EXCISION RIGHT THIGH SOFT  TISSUE MASS;  Surgeon: [name], [name];  [facility];  Service: Orthopedics;  Laterality: Right;  . SEPTOPLASTY    . SHOULDER ARTHROSCOPY WITH ROTATOR CUFF REPAIR Left   . SLAP Tear Repair  2008  . TONSILLECTOMY    . TOTAL KNEE ARTHROPLASTY Left 12/08/2016   Procedure: LEFT TOTAL KNEE ARTHROPLASTY;  Surgeon: [name], [name];  [facility];  Service: Orthopedics;  Laterality: Left;  . TOTAL KNEE ARTHROPLASTY WITH REVISION COMPONENTS Left 04/21/2018   Procedure: Left knee polyethylene exchange ;  Surgeon: [name], [name];  [facility];  Service: Orthopedics;  Laterality: Left;    There were no vitals filed for this visit.  Subjective Assessment - 03/15/20 1421    Subjective  Still a little tightness in upper trap region. Just came off of a week at work.    Currently in Pain?  Yes    Pain Score  5     Pain Location  Shoulder    Pain Orientation  Right    Pain Descriptors / Indicators  Tightness                        [facility] Adult PT Treatment/Exercise - 03/15/20 0001      Shoulder Exercises: Supine   Protraction  Limitations  protraction/retraction @ 90 & 120, 140    Flexion  20 reps;Theraband    Theraband Level (Shoulder Flexion)  Level 2 (Red)      Shoulder Exercises: Seated   Abduction  20 reps;10 reps   about 30, to fatigue   ABduction Limitations  with PT guiding scapular movement      Shoulder Exercises: Sidelying   ABduction  20 reps;Theraband    Theraband Level (Shoulder ABduction)  Level 2 (Red)      Shoulder Exercises: Stretch   Wall Stretch - Flexion Limitations  10 reps    Wall Stretch - ABduction Limitations  10 reps      Manual Therapy   Manual therapy comments  skilled palpation and monitoring during TPDN    Joint Mobilization  scapular mobility, thoracic PA    Soft tissue mobilization  Rt upper trap       Trigger Point Dry Needling - 03/15/20 0001    Upper Trapezius Response  Twitch reponse elicited;Palpable increased  muscle length   Right            PT Short Term Goals - 01/23/20 1723      PT SHORT TERM GOAL #1   Title  Pt will be independent in postural correction    Baseline  cues for retraction at eval    Time  4    Period  Weeks    Status  New    Target Date  02/24/20      PT SHORT TERM GOAL #2   Title  pt will perform stretches at least 3 times daily  to be proactive about pain onset    Baseline  began educating at eval    Time  4    Period  Weeks    Status  New    Target Date  02/24/20        PT Long Term Goals - 01/23/20 1717      PT LONG TERM GOAL #1   Title  Equal cervical rotation Rt to Lt without discomfort    Baseline  see flowsheet    Time  10    Period  Weeks    Status  New    Target Date  04/06/20      PT LONG TERM GOAL #2   Title  gross GHJ strength 5/5    Baseline  see flowsheet    Time  10    Period  Weeks    Status  New    Target Date  04/06/20      PT LONG TERM GOAL #3   Title  pt will be able to complete a work week without onset of HA or N/T    Baseline  complains of this at this time    Time  10    Period  Weeks    Status  New    Target Date  04/06/20      PT LONG TERM GOAL #4   Title  pt will be able to carry and lift objects required for ADLs    Baseline  limited in [facility] reaching/lifting and avoids lifting    Time  10    Period  Weeks    Status  New    Target Date  04/06/20      PT LONG TERM GOAL #5   Title  Pt will be independent in long term HEP/gym program for continued strengthening    Baseline  will progress as appropriate  Time  10    Period  Weeks    Status  New    Target Date  04/06/20            Plan - 03/15/20 1457    Clinical Impression Statement  Reduced upper trap tension with DN today but noted to have poor scapular mobility above 90 deg. Fatigued abduction movement today with PT assisting scapular movement.    PT Treatment/Interventions  ADLs/Self Care Home Management;Cryotherapy;Electrical  Stimulation;Ultrasound;Traction;Moist Heat;Iontophoresis 4mg /ml Dexamethasone;Therapeutic activities;Therapeutic exercise;Neuromuscular re-education;Manual techniques;Patient/family education;Passive range of motion;Dry needling;Taping;Joint Manipulations;Spinal Manipulations    PT Next Visit Plan  scapular distraction & mobility    PT Home Exercise Plan  [id number]    Consulted and Agree with Plan of Care  Patient       Patient will benefit from skilled therapeutic intervention in order to improve the following deficits and impairments:  Decreased range of motion, Impaired UE functional use, Increased muscle spasms, Decreased activity tolerance, Pain, Impaired flexibility, Improper body mechanics, Decreased strength, Impaired sensation, Postural dysfunction  Visit Diagnosis: Cervicalgia  Chronic right shoulder pain  Muscle weakness (generalized)     Problem List Patient Active Problem List   Diagnosis Date Noted  . Right shoulder injury 06/15/2018  . S/P arthroscopy of right shoulder 06/15/2018  . Failed total knee arthroplasty ([place]) 04/21/2018  . OA (osteoarthritis) of knee 12/08/2016  . 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 Cervical facet joint syndrome 12/17/2015  . Chronic bilateral low back pain without sciatica 12/17/2015  . Primary osteoarthritis of left knee 12/17/2015    Barbarita Hutmacher C. Latresa Gasser PT, DPT 03/15/20 2:59 PM   [place] [facility] [address] [place], [place], [address] Phone: [phone]   Fax:  [phone]  Name: Daniel FALLER, [name] MRN: [id number] Date of Birth: 01-Feb-1961

## 2020-03-19 ENCOUNTER — Ambulatory Visit: Payer: 59 | Admitting: Physical Therapy

## 2020-03-19 ENCOUNTER — Other Ambulatory Visit: Payer: Self-pay

## 2020-03-19 ENCOUNTER — Other Ambulatory Visit (HOSPITAL_COMMUNITY): Payer: Self-pay | Admitting: Internal Medicine

## 2020-03-19 DIAGNOSIS — M6281 Muscle weakness (generalized): Secondary | ICD-10-CM

## 2020-03-19 DIAGNOSIS — M546 Pain in thoracic spine: Secondary | ICD-10-CM | POA: Diagnosis not present

## 2020-03-19 DIAGNOSIS — M542 Cervicalgia: Secondary | ICD-10-CM | POA: Diagnosis not present

## 2020-03-19 DIAGNOSIS — M9903 Segmental and somatic dysfunction of lumbar region: Secondary | ICD-10-CM | POA: Diagnosis not present

## 2020-03-19 DIAGNOSIS — M545 Low back pain: Secondary | ICD-10-CM | POA: Diagnosis not present

## 2020-03-19 DIAGNOSIS — G8929 Other chronic pain: Secondary | ICD-10-CM | POA: Diagnosis not present

## 2020-03-19 DIAGNOSIS — M25511 Pain in right shoulder: Secondary | ICD-10-CM | POA: Diagnosis not present

## 2020-03-19 NOTE — Therapy (Signed)
[place], [place], [address] Phone: [phone]   Fax:  [phone]  Physical Therapy Treatment  Patient Details  Name: Daniel MONTANARI, MD MRN: [id number] Date of Birth: 1961/02/03 Referring Provider (PT): [name], MD   Encounter Date: 03/19/2020  PT End of Session - 03/19/20 1710    Visit Number  6    Number of Visits  22    Date for PT Re-Evaluation  04/06/20    Authorization Type  UMR/[employer]are    PT Start Time  1630    PT Stop Time  1710    PT Time Calculation (min)  40 min    Activity Tolerance  Patient tolerated treatment well    Behavior During Therapy  [facility] for tasks assessed/performed       Past Medical History:  Diagnosis Date  . Anxiety   . Aortic atherosclerosis ([place])   . BPH (benign prostatic hyperplasia)   . Chronic pain syndrome    neck and low back  . DDD (degenerative disc disease), cervical   . Diverticulosis   . DJD (degenerative joint disease)   . Ganglion cyst    12 mm , right posterior knee  . Hyperlipidemia   . Internal hemorrhoids   . Migraines   . OA (osteoarthritis)    knees, hands, right shoulder  . Positive QuantiFERON-TB Gold test   . Shoulder pain   . Tear of right rotator cuff   . Type 2 diabetes mellitus ([place])    last A1c 5.4 on 04-13-2018 in epic (followed by pcp)  . Wears glasses     Past Surgical History:  Procedure Laterality Date  . ANAL FISSURE REPAIR    . ARTHOSCOPIC ROTAOR CUFF REPAIR Right 06/15/2018   Procedure: RIGHT SHOULDER ARTHROSCOPY, DEBRIDEMENT, SUBACROMIAL DECOMPRESSION, DISTAL CLAVICLE RESECTION, ROTATOR CUFF REPAIR;  Surgeon: [name], MD;  Location: [place];  Service: Orthopedics;  Laterality: Right;  . COLONOSCOPY  05-26-2017   dr Henrene Pastor  . Hip Resurface  2006  . KNEE ARTHROSCOPY W/ MENISCAL REPAIR Bilateral right 1993; left 2010  . MASS EXCISION Right 04/21/2018   Procedure: EXCISION RIGHT THIGH SOFT  TISSUE MASS;  Surgeon: [name], MD;  [facility];  Service: Orthopedics;  Laterality: Right;  . SEPTOPLASTY    . SHOULDER ARTHROSCOPY WITH ROTATOR CUFF REPAIR Left   . SLAP Tear Repair  2008  . TONSILLECTOMY    . 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 TOTAL KNEE ARTHROPLASTY WITH REVISION COMPONENTS Left 04/21/2018   Procedure: Left knee polyethylene exchange ;  Surgeon: [name], MD;  [facility];  Service: Orthopedics;  Laterality: Left;    There were no vitals filed for this visit.                     [place] Adult PT Treatment/Exercise - 03/19/20 0001      Shoulder Exercises: Seated   Horizontal ABduction Limitations  with scapular mobility assist      Shoulder Exercises: Standing   Flexion Limitations  wand in hands- low trap set off of wall    Other Standing Exercises  back to wall- wand in hands    Other Standing Exercises  wall push ups- wide & triceps      Shoulder Exercises: ROM/Strengthening   UBE (Upper Arm Bike)  retro 3 min L3    Lat Pull Limitations  45lb    Cybex Row Limitations  wide & narrow 45lb      Manual Therapy   Manual therapy comments  skilled palpation and monitoring during TPDN    Soft tissue mobilization  Rt upper trap IASTM, Rt glut max      Neck Exercises: Stretches   Upper Trapezius Stretch  Right;Left;30 seconds       Trigger Point Dry Needling - 03/19/20 0001    Muscles Treated Back/Hip  Gluteus maximus;Piriformis    Gluteus Maximus Response  Twitch response elicited;Palpable increased muscle length   Right   Piriformis Response  Twitch response elicited;Palpable increased muscle length   right            PT Short Term Goals - 03/19/20 1717      PT SHORT TERM GOAL #1   Title  Pt will be independent in postural correction    Status  Achieved      PT SHORT  TERM GOAL #2   Title  pt will perform stretches at least 3 times daily  to be proactive about pain onset    Status  Achieved        PT Long Term Goals - 01/23/20 1717      PT LONG TERM GOAL #1   Title  Equal cervical rotation Rt to Lt without discomfort    Baseline  see flowsheet    Time  10    Period  Weeks    Status  New    Target Date  04/06/20      PT LONG TERM GOAL #2   Title  gross GHJ strength 5/5    Baseline  see flowsheet    Time  10    Period  Weeks    Status  New    Target Date  04/06/20      PT LONG TERM GOAL #3   Title  pt will be able to complete a work week without onset of HA or N/T    Baseline  complains of this at this time    Time  10    Period  Weeks    Status  New    Target Date  04/06/20      PT LONG TERM GOAL #4   Title  pt will be able to carry and lift objects required for ADLs    Baseline  limited in [facility] reaching/lifting and avoids lifting    Time  10    Period  Weeks    Status  New    Target Date  04/06/20      PT LONG TERM GOAL #5   Title  Pt will be independent in long term HEP/gym program for continued strengthening    Baseline  will progress as appropriate    Time  10    Period  Weeks    Status  New    Target Date  04/06/20            Plan - 03/19/20 1710    Clinical Impression Statement  Significant improvement in scapular mobility in UE motion. Assistance provided from about 120 to end range with fatigue after approx 20 reps. Added weight machines to exercises with focus on scapular movement and asked that he begin at the gym with this inmind.    PT Treatment/Interventions  ADLs/Self Care Home Management;Cryotherapy;Electrical Stimulation;Ultrasound;Traction;Moist Heat;Iontophoresis 4mg /ml Dexamethasone;Therapeutic activities;Therapeutic exercise;Neuromuscular re-education;Manual techniques;Patient/family education;Passive range of motion;Dry needling;Taping;Joint  Manipulations;Spinal Manipulations    PT Next Visit Plan   continue scapular mobility, overhead motions    PT Home Exercise Plan  [id number]    Consulted and Agree with Plan of Care  Patient       Patient will benefit from skilled therapeutic intervention in order to improve the following deficits and impairments:  Decreased range of motion, Impaired UE functional use, Increased muscle spasms, Decreased activity tolerance, Pain, Impaired flexibility, Improper body mechanics, Decreased strength, Impaired sensation, Postural dysfunction  Visit Diagnosis: Cervicalgia  Chronic right shoulder pain  Muscle weakness (generalized)     Problem List Patient Active Problem List   Diagnosis Date Noted  . Right shoulder injury 06/15/2018  . S/P arthroscopy of right shoulder 06/15/2018  . Failed total knee arthroplasty ([place]) 04/21/2018  . OA (osteoarthritis) of knee 12/08/2016  . 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 Cervical facet joint syndrome 12/17/2015  . Chronic bilateral low back pain without sciatica 12/17/2015  . Primary osteoarthritis of left knee 12/17/2015    Jissell Trafton C. Willia Genrich PT, DPT 03/19/20 5:18 PM   [place], [place], [address] Phone: [phone]   Fax:  [phone]  Name: TAYLON COOLE, MD MRN: [id number] Date of Birth: 1961-09-24

## 2020-04-02 ENCOUNTER — Encounter: Payer: Self-pay | Admitting: Physical Therapy

## 2020-04-02 ENCOUNTER — Ambulatory Visit: Payer: 59 | Admitting: Physical Therapy

## 2020-04-02 ENCOUNTER — Other Ambulatory Visit: Payer: Self-pay

## 2020-04-02 DIAGNOSIS — M6281 Muscle weakness (generalized): Secondary | ICD-10-CM | POA: Diagnosis not present

## 2020-04-02 DIAGNOSIS — M25511 Pain in right shoulder: Secondary | ICD-10-CM | POA: Diagnosis not present

## 2020-04-02 DIAGNOSIS — M542 Cervicalgia: Secondary | ICD-10-CM

## 2020-04-02 DIAGNOSIS — G8929 Other chronic pain: Secondary | ICD-10-CM

## 2020-04-03 NOTE — Therapy (Signed)
[name], [place], [address] Phone: [phone]   Fax:  [phone]  Physical Therapy Treatment  Patient Details  Name: [name], [name] MRN: [id number] Date of Birth: 1960-11-24 Referring Provider (PT): [name], [name]   Encounter Date: 04/02/2020   PT End of Session - 04/03/20 0659    Visit Number 7    Number of Visits 22    Date for PT Re-Evaluation 04/06/20    Authorization Type UMR/[employer]    PT Start Time 1630    PT Stop Time 1705    PT Time Calculation (min) 35 min    Activity Tolerance Patient tolerated treatment well    Behavior During Therapy [facility] for tasks assessed/performed           Past Medical History:  Diagnosis Date  . Anxiety   . Aortic atherosclerosis ([place])   . BPH (benign prostatic hyperplasia)   . Chronic pain syndrome    neck and low back  . DDD (degenerative disc disease), cervical   . Diverticulosis   . DJD (degenerative joint disease)   . Ganglion cyst    12 mm , right posterior knee  . Hyperlipidemia   . Internal hemorrhoids   . Migraines   . OA (osteoarthritis)    knees, hands, right shoulder  . Positive QuantiFERON-TB Gold test   . Shoulder pain   . Tear of right rotator cuff   . Type 2 diabetes mellitus ([place])    last A1c 5.4 on 04-13-2018 in epic (followed by pcp)  . Wears glasses     Past Surgical History:  Procedure Laterality Date  . ANAL FISSURE REPAIR    . ARTHOSCOPIC ROTAOR CUFF REPAIR Right 06/15/2018   Procedure: RIGHT SHOULDER ARTHROSCOPY, DEBRIDEMENT, SUBACROMIAL DECOMPRESSION, DISTAL CLAVICLE RESECTION, ROTATOR CUFF REPAIR;  Surgeon: [name], [name];  Location: [place];  Service: Orthopedics;  Laterality: Right;  . COLONOSCOPY  05-26-2017   dr Henrene Pastor  . Hip Resurface  2006  . KNEE ARTHROSCOPY W/ MENISCAL REPAIR Bilateral right 1993; left 2010  . MASS EXCISION Right 04/21/2018   Procedure: EXCISION RIGHT THIGH SOFT  TISSUE MASS;  Surgeon: [name], [name];  [facility];  Service: Orthopedics;  Laterality: Right;  . SEPTOPLASTY    . SHOULDER ARTHROSCOPY WITH ROTATOR CUFF REPAIR Left   . SLAP Tear Repair  2008  . TONSILLECTOMY    . TOTAL KNEE ARTHROPLASTY Left 12/08/2016   Procedure: LEFT TOTAL KNEE ARTHROPLASTY;  Surgeon: [name], [name];  [facility];  Service: Orthopedics;  Laterality: Left;  . TOTAL KNEE ARTHROPLASTY WITH REVISION COMPONENTS Left 04/21/2018   Procedure: Left knee polyethylene exchange ;  Surgeon: [name], [name];  [facility];  Service: Orthopedics;  Laterality: Left;    There were no vitals filed for this visit.   Subjective Assessment - 04/02/20 1633    Subjective upper traps are tight and lower back is tight as well after 7 day stretvch at work. reports migraine last 2 days.    Currently in Pain? Yes    Pain Score 7     Pain Location Shoulder    Pain Orientation Right    Pain Descriptors / Indicators Sore                             [facility] Adult PT Treatment/Exercise - 04/02/20 0001      Shoulder Exercises: Seated  External Rotation Both;20 reps;Theraband    Theraband Level (Shoulder External Rotation) Level 3 (Green)    External Rotation Limitations one arm isometric hold, opp moving    Flexion Limitations full range red plyoball in Rt hand    ABduction Limitations full range red plyoball in Rt hand      Shoulder Exercises: Standing   Protraction Limitations OH ball circles on wall, red plyoball      Manual Therapy   Manual therapy comments skilled palpation and monitoring during TPDN    Joint Mobilization prone Rt rib & SIJ mobs, prone cervical PA, C6 Rt to Lt gr 4    Soft tissue mobilization Rt upper trap, suboccipitals, Rt glutmax      Neck Exercises: Stretches   Levator Stretch 2 reps;Right;Left            Trigger Point Dry Needling - 04/02/20 0001    Muscles Treated Head and Neck Suboccipitals    Dry Needling  Comments paraspinals Rt C2    Upper Trapezius Response Twitch reponse elicited;Palpable increased muscle length   Rt   Suboccipitals Response Palpable increased muscle length   Rt   Gluteus Maximus Response Twitch response elicited;Palpable increased muscle length   Rt                 PT Short Term Goals - 03/19/20 1717      PT SHORT TERM GOAL #1   Title Pt will be independent in postural correction    Status Achieved      PT SHORT TERM GOAL #2   Title pt will perform stretches at least 3 times daily  to be proactive about pain onset    Status Achieved             PT Long Term Goals - 01/23/20 1717      PT LONG TERM GOAL #1   Title Equal cervical rotation Rt to Lt without discomfort    Baseline see flowsheet    Time 10    Period Weeks    Status New    Target Date 04/06/20      PT LONG TERM GOAL #2   Title gross GHJ strength 5/5    Baseline see flowsheet    Time 10    Period Weeks    Status New    Target Date 04/06/20      PT LONG TERM GOAL #3   Title pt will be able to complete a work week without onset of HA or N/T    Baseline complains of this at this time    Time 10    Period Weeks    Status New    Target Date 04/06/20      PT LONG TERM GOAL #4   Title pt will be able to carry and lift objects required for ADLs    Baseline limited in [facility] reaching/lifting and avoids lifting    Time 10    Period Weeks    Status New    Target Date 04/06/20      PT LONG TERM GOAL #5   Title Pt will be independent in long term HEP/gym program for continued strengthening    Baseline will progress as appropriate    Time 10    Period Weeks    Status New    Target Date 04/06/20                 Plan - 04/03/20 0656    Clinical Impression Statement  Continues to demo improved control of scapular mobility, min cues required during exercises. Fatigues quickly with full range motions but recognizes poor movement. DN to Rt neck/shoulder as well as low back which  improved overall posture and decreased pain. Asked him to empty the dishwasher at home for functional, large range, endurance challenge to shoulder.    PT Treatment/Interventions ADLs/Self Care Home Management;Cryotherapy;Electrical Stimulation;Ultrasound;Traction;Moist Heat;Iontophoresis 4mg /ml Dexamethasone;Therapeutic activities;Therapeutic exercise;Neuromuscular re-education;Manual techniques;Patient/family education;Passive range of motion;Dry needling;Taping;Joint Manipulations;Spinal Manipulations    PT Next Visit Plan cont large range & endurance, ERO    PT Home Exercise Plan [id number]    Consulted and Agree with Plan of Care Patient           Patient will benefit from skilled therapeutic intervention in order to improve the following deficits and impairments:  Decreased range of motion, Impaired UE functional use, Increased muscle spasms, Decreased activity tolerance, Pain, Impaired flexibility, Improper body mechanics, Decreased strength, Impaired sensation, Postural dysfunction  Visit Diagnosis: Cervicalgia  Chronic right shoulder pain     Problem List Patient Active Problem List   Diagnosis Date Noted  . Right shoulder injury 06/15/2018  . S/P arthroscopy of right shoulder 06/15/2018  . 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 OA (osteoarthritis) of knee 12/08/2016  . 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 Cervical facet joint syndrome 12/17/2015  . Chronic bilateral low back pain without sciatica 12/17/2015  . Primary osteoarthritis of left knee 12/17/2015   Jillianne Gamino C. Shandon Matson PT, DPT 04/03/20 7:01 AM   [facility] [address] [place], [place], [address] Phone: [phone]   Fax:  [phone]  Name: Daniel MATULICH, [name] MRN: [id number] Date of Birth: Jul 14, 1961

## 2020-04-12 ENCOUNTER — Other Ambulatory Visit: Payer: Self-pay

## 2020-04-12 ENCOUNTER — Ambulatory Visit: Payer: 59 | Attending: Specialist | Admitting: Physical Therapy

## 2020-04-12 ENCOUNTER — Encounter: Payer: Self-pay | Admitting: Physical Therapy

## 2020-04-12 DIAGNOSIS — M25511 Pain in right shoulder: Secondary | ICD-10-CM | POA: Insufficient documentation

## 2020-04-12 DIAGNOSIS — M6281 Muscle weakness (generalized): Secondary | ICD-10-CM | POA: Insufficient documentation

## 2020-04-12 DIAGNOSIS — M542 Cervicalgia: Secondary | ICD-10-CM | POA: Diagnosis not present

## 2020-04-12 DIAGNOSIS — G8929 Other chronic pain: Secondary | ICD-10-CM | POA: Insufficient documentation

## 2020-04-12 DIAGNOSIS — R293 Abnormal posture: Secondary | ICD-10-CM | POA: Insufficient documentation

## 2020-04-12 MED FILL — PANTOPRAZOLE SOD DR 40 MG T: 40 | 90 days supply | Qty: 90 | Fill #2

## 2020-04-12 NOTE — Therapy (Signed)
[place], [place], [address] Phone: [phone]   Fax:  [phone]  Physical Therapy Treatment/Re-Eval  Patient Details  Name: ITALO BANTON, MD MRN: [id number] Date of Birth: 01-03-1961 Referring Provider (PT): [name], MD   Encounter Date: 04/12/2020   PT End of Session - 04/12/20 1549    Visit Number 8    Number of Visits 22    Date for PT Re-Evaluation 06/08/20    Authorization Type UMR/[employer]    PT Start Time 1547    PT Stop Time 1626    PT Time Calculation (min) 39 min    Activity Tolerance Patient tolerated treatment well    Behavior During Therapy [facility] for tasks assessed/performed           Past Medical History:  Diagnosis Date  . Anxiety   . Aortic atherosclerosis ([place])   . BPH (benign prostatic hyperplasia)   . Chronic pain syndrome    neck and low back  . DDD (degenerative disc disease), cervical   . Diverticulosis   . DJD (degenerative joint disease)   . Ganglion cyst    12 mm , right posterior knee  . Hyperlipidemia   . Internal hemorrhoids   . Migraines   . OA (osteoarthritis)    knees, hands, right shoulder  . Positive QuantiFERON-TB Gold test   . Shoulder pain   . Tear of right rotator cuff   . Type 2 diabetes mellitus ([place])    last A1c 5.4 on 04-13-2018 in epic (followed by pcp)  . Wears glasses     Past Surgical History:  Procedure Laterality Date  . ANAL FISSURE REPAIR    . ARTHOSCOPIC ROTAOR CUFF REPAIR Right 06/15/2018   Procedure: RIGHT SHOULDER ARTHROSCOPY, DEBRIDEMENT, SUBACROMIAL DECOMPRESSION, DISTAL CLAVICLE RESECTION, ROTATOR CUFF REPAIR;  Surgeon: [name], MD;  Location: [place];  Service: Orthopedics;  Laterality: Right;  . COLONOSCOPY  05-26-2017   dr Henrene Pastor  . Hip Resurface  2006  . KNEE ARTHROSCOPY W/ MENISCAL REPAIR Bilateral right 1993; left 2010  . MASS EXCISION Right 04/21/2018   Procedure: EXCISION RIGHT THIGH  SOFT TISSUE MASS;  Surgeon: [name], MD;  Location: [facility];  Service: Orthopedics;  Laterality: Right;  . SEPTOPLASTY    . SHOULDER ARTHROSCOPY WITH ROTATOR CUFF REPAIR Left   . SLAP Tear Repair  2008  . TONSILLECTOMY    . 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 TOTAL KNEE ARTHROPLASTY WITH REVISION COMPONENTS Left 04/21/2018   Procedure: Left knee polyethylene exchange ;  Surgeon: [name], MD;  Location[facility];  Service: Orthopedics;  Laterality: Left;    There were no vitals filed for this visit.       [facility] PT Assessment - 04/12/20 0001      Assessment   Medical Diagnosis Rt shoulder pain    Referring Provider (PT) [name], MD    Onset Date/Surgical Date 06/15/18      Sensation   Additional Comments only gets some N/T toward the end of long work weeks      AROM   Overall AROM Comments good scapular control in full range    Cervical Flexion 36    Cervical Extension 40   end range pinching   Cervical - Right Side Bend 34    Cervical - Left Side  Bend 36    Cervical - Right Rotation 70    Cervical - Left Rotation 64      Strength   Overall Strength Comments Rt GHJ gross 4/5                         [facility] Adult PT Treatment/Exercise - 04/12/20 0001      Manual Therapy   Manual therapy comments skilled palpation and monitoring during TPDN    Joint Mobilization prone rib, thoracic PA; Rt first rib depression; Rt upper quadrant of SIJ spring hold 2 min    Soft tissue mobilization suboccipitals, Rt upper trap, levator            Trigger Point Dry Needling - 04/12/20 0001    Muscles Treated Head and Neck Levator scapulae    Upper Trapezius Response Twitch reponse elicited;Palpable increased muscle length   right   Suboccipitals Response Twitch response elicited;Palpable increased muscle length   right    Levator Scapulae Response Twitch response elicited;Palpable increased muscle length   right   Gluteus Maximus Response Twitch response elicited;Palpable increased muscle length   Rt               PT Education - 04/12/20 1635    Education Details goals, POC, LLD & heel lift    Person(s) Educated Patient    Methods Explanation    Comprehension Verbalized understanding;Other (comment)            PT Short Term Goals - 03/19/20 1717      PT SHORT TERM GOAL #1   Title Pt will be independent in postural correction    Status Achieved      PT SHORT TERM GOAL #2   Title pt will perform stretches at least 3 times daily  to be proactive about pain onset    Status Achieved             PT Long Term Goals - 04/12/20 1549      PT LONG TERM GOAL #1   Title Equal cervical rotation Rt to Lt without discomfort      PT LONG TERM GOAL #2   Title gross GHJ strength 5/5    Baseline gross Rt UE 4/5      PT LONG TERM GOAL #3   Title pt will be able to complete a work week without onset of HA or N/T    Baseline HA toward the end of the week when I feel my upper trap and cervical regions lock up. in my off week and in the first few days of work I do ok- as long as I do my exercises.      PT LONG TERM GOAL #4   Title pt will be able to carry and lift objects required for ADLs    Baseline it bothers my lower back a little when I lift with my Rt arm, feel the difference vs Lt      PT LONG TERM GOAL #5   Title Pt will be independent in long term HEP/gym program for continued strengthening    Baseline requires further progression    Status On-going                 Plan - 04/12/20 1635    Clinical Impression Statement Pt demo improved ROM in all directions even feeling very locked up. Still has discomfort at end ranges. Noted increase in pain at the end of long  work weeks but is able to control pain levels with exercises otherwise indicating need for further endurance training.  While MMT score did not improve, he demo significant improvement in control of scapular movement in full range and no longer presents shoulder hike. Pt will cont to benefit from skilled PT to address endurance deficits and progress toward LTGs. Extended date for POC due to work schedule. He continues to have Rt LBP with limited mobility in SIJ. LLE is longer than the Rt and I gave him a heel lift to try in his Rt shoe- will get updates at next visit.    PT Frequency 2x / week    PT Duration 8 weeks   will have large gaps for work schedule   PT Treatment/Interventions ADLs/Self Care Home Management;Cryotherapy;Electrical Stimulation;Ultrasound;Traction;Moist Heat;Iontophoresis 4mg /ml Dexamethasone;Therapeutic activities;Therapeutic exercise;Neuromuscular re-education;Manual techniques;Patient/family education;Passive range of motion;Dry needling;Taping;Joint Manipulations;Spinal Manipulations    PT Next Visit Plan overhead lifting, endurance, how did heel lift feel?    PT Home Exercise Plan [id number]    Consulted and Agree with Plan of Care Patient           Patient will benefit from skilled therapeutic intervention in order to improve the following deficits and impairments:  Decreased range of motion, Impaired UE functional use, Increased muscle spasms, Decreased activity tolerance, Pain, Impaired flexibility, Improper body mechanics, Decreased strength, Impaired sensation, Postural dysfunction  Visit Diagnosis: Cervicalgia - Plan: PT plan of care cert/re-cert  Chronic right shoulder pain - Plan: PT plan of care cert/re-cert  Muscle weakness (generalized) - Plan: PT plan of care cert/re-cert  Abnormal posture - Plan: PT plan of care cert/re-cert     Problem List Patient Active Problem List   Diagnosis Date Noted  . Right shoulder injury 06/15/2018  . S/P arthroscopy of right shoulder 06/15/2018  . Failed total knee arthroplasty ([place]) 04/21/2018  . OA (osteoarthritis) of knee 12/08/2016   . 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 Cervical facet joint syndrome 12/17/2015  . Chronic bilateral low back pain without sciatica 12/17/2015  . Primary osteoarthritis of left knee 12/17/2015    Sarinah Doetsch C. Ramon Zanders PT, DPT 04/12/20 4:44 PM   [facility] [address] [place], [place], [address] Phone: [phone]   Fax:  [phone]  Name: KERBY BORNER, MD MRN: [id number] Date of Birth: 1961-02-29

## 2020-04-17 ENCOUNTER — Telehealth: Payer: Self-pay | Admitting: Internal Medicine

## 2020-04-17 NOTE — Telephone Encounter (Signed)
Pt states he has found a rectal mass about 3-4 weeks ago. Reports it is slightly painful and he has noticed some bowel changes (diarrhea at times) and some blood. States he feels it only after a bowel movement when he strains a little bit. States it feels like a SQ mass and it is located above the verge. Pt concerned and would like to have the area looked at. Please advise.

## 2020-04-17 NOTE — Telephone Encounter (Signed)
Maura Crandall is Dr Blanch Media nurse thanks

## 2020-04-18 NOTE — Telephone Encounter (Signed)
Pt will call back with available dates to get him in for an appt.

## 2020-04-18 NOTE — Telephone Encounter (Signed)
Would be great if you could get Dr. Clydene Laming in to see anybody ASAP, based on his schedule.  Thanks

## 2020-04-18 NOTE — Telephone Encounter (Signed)
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 He is going to look at his schedule and call back and let us know when he is off and could come for visit. States he does not have a problem seeing a PA first if he needs to. Will await his call back.

## 2020-04-18 NOTE — Telephone Encounter (Signed)
As you know, scheduled today as well.  However, I can see Dr. Sherral Hammers at 3:40 PM today.  Otherwise, as you know, I am not back in the office until 3 weeks from now.  If he prefers to wait, let me know (because my schedule is full and we will need to find a working spot).  Thanks

## 2020-04-19 NOTE — Telephone Encounter (Signed)
Pt called back and has availability on 7/29.Dr. Henrene Pastor can see pt on 05/10/20@1 :40pm. Pt aware of appt.

## 2020-04-20 MED FILL — LIDOCAINE PATCH 5%: 5 | 30 days supply | Qty: 60 | Fill #2

## 2020-04-20 MED FILL — metFORMIN HCL 1000 MG TABS: 1000 | 90 days supply | Qty: 180 | Fill #2

## 2020-04-20 MED FILL — CYCLOBENZAPRINE HCL 10 MG T: 10 | 90 days supply | Qty: 270 | Fill #1

## 2020-04-20 MED FILL — RIZATRIPTAN 5 MG ODT: 5 | 30 days supply | Qty: 15 | Fill #1

## 2020-04-20 MED FILL — diazePAM 5 MG TABS: 5 | 30 days supply | Qty: 60 | Fill #1

## 2020-04-20 MED FILL — BUTALB-ACETAMIN-CAFF 50-325: 50-325-40 | 10 days supply | Qty: 60 | Fill #0

## 2020-04-20 MED FILL — TOPIRAMATE 25 MG TAB: 25 | 90 days supply | Qty: 90 | Fill #2

## 2020-04-23 MED FILL — oxyCODONE HCL 5 MG TABS: 5 | 30 days supply | Qty: 180 | Fill #0

## 2020-04-23 MED FILL — traMADol HCL 50 MG TABS: 50 | 30 days supply | Qty: 240 | Fill #0

## 2020-04-26 ENCOUNTER — Ambulatory Visit: Payer: 59 | Admitting: Physical Therapy

## 2020-04-26 ENCOUNTER — Other Ambulatory Visit: Payer: Self-pay

## 2020-04-26 ENCOUNTER — Encounter: Payer: Self-pay | Admitting: Physical Therapy

## 2020-04-26 DIAGNOSIS — M25511 Pain in right shoulder: Secondary | ICD-10-CM | POA: Diagnosis not present

## 2020-04-26 DIAGNOSIS — G8929 Other chronic pain: Secondary | ICD-10-CM | POA: Diagnosis not present

## 2020-04-26 DIAGNOSIS — R293 Abnormal posture: Secondary | ICD-10-CM | POA: Diagnosis not present

## 2020-04-26 DIAGNOSIS — M6281 Muscle weakness (generalized): Secondary | ICD-10-CM | POA: Diagnosis not present

## 2020-04-26 DIAGNOSIS — M542 Cervicalgia: Secondary | ICD-10-CM

## 2020-04-26 NOTE — Therapy (Signed)
[place], [place], [address] Phone: [phone]   Fax:  [phone]  Physical Therapy Treatment  Patient Details  Name: Daniel SANDERS, MD MRN: [id number] Date of Birth: 10/17/1960 Referring Provider (PT): [name], MD   Encounter Date: 04/26/2020   PT End of Session - 04/26/20 1544    Visit Number 9    Number of Visits 22    Date for PT Re-Evaluation 06/08/20    Authorization Type UMR/[employer]    PT Start Time 1544    PT Stop Time 1629    PT Time Calculation (min) 45 min    Activity Tolerance Patient tolerated treatment well    Behavior During Therapy [facility] for tasks assessed/performed           Past Medical History:  Diagnosis Date  . Anxiety   . Aortic atherosclerosis ([place])   . BPH (benign prostatic hyperplasia)   . Chronic pain syndrome    neck and low back  . DDD (degenerative disc disease), cervical   . Diverticulosis   . DJD (degenerative joint disease)   . Ganglion cyst    12 mm , right posterior knee  . Hyperlipidemia   . Internal hemorrhoids   . Migraines   . OA (osteoarthritis)    knees, hands, right shoulder  . Positive QuantiFERON-TB Gold test   . Shoulder pain   . Tear of right rotator cuff   . Type 2 diabetes mellitus ([place])    last A1c 5.4 on 04-13-2018 in epic (followed by pcp)  . Wears glasses     Past Surgical History:  Procedure Laterality Date  . ANAL FISSURE REPAIR    . ARTHOSCOPIC ROTAOR CUFF REPAIR Right 06/15/2018   Procedure: RIGHT SHOULDER ARTHROSCOPY, DEBRIDEMENT, SUBACROMIAL DECOMPRESSION, DISTAL CLAVICLE RESECTION, ROTATOR CUFF REPAIR;  Surgeon: [name], MD;  Location: [facility];  Service: Orthopedics;  Laterality: Right;  . COLONOSCOPY  05-26-2017   dr Henrene Pastor  . Hip Resurface  2006  . KNEE ARTHROSCOPY W/ MENISCAL REPAIR Bilateral right 1993; left 2010  . MASS EXCISION Right 04/21/2018   Procedure: EXCISION RIGHT THIGH SOFT  TISSUE MASS;  Surgeon: [name], MD;  [facility];  Service: Orthopedics;  Laterality: Right;  . SEPTOPLASTY    . SHOULDER ARTHROSCOPY WITH ROTATOR CUFF REPAIR Left   . SLAP Tear Repair  2008  . TONSILLECTOMY    . 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 TOTAL KNEE ARTHROPLASTY WITH REVISION COMPONENTS Left 04/21/2018   Procedure: Left knee polyethylene exchange ;  Surgeon: [name], MD;  [facility];  Service: Orthopedics;  Laterality: Left;    There were no vitals filed for this visit.   Subjective Assessment - 04/26/20 1546    Subjective Lift was helpful to lower back. Still stiff on Lt side of neck. I think shoulder is getting stronger. No shooting pain down arms. I have not been back to the gym yet.                             [facility] Adult PT Treatment/Exercise - 04/26/20 0001      Shoulder Exercises: Sidelying   Flexion Right    Flexion Weight (lbs) 2      Shoulder Exercises: Standing   Flexion Limitations thumbs  touching- head against towel on wall    Other Standing Exercises flx & abd 2l    Other Standing Exercises snow angels no weight      Manual Therapy   Manual therapy comments skilled palpation and monitoring during TPDN    Joint Mobilization Rt SB/flex/rot with hold & push ups for L5 rotation    Soft tissue mobilization Rt upper traps, rhomboids            Trigger Point Dry Needling - 04/26/20 0001    Muscles Treated Upper Quadrant Rhomboids    Other Dry Needling Rt L5 paraspinals    Upper Trapezius Response Twitch reponse elicited;Palpable increased muscle length   Rt   Levator Scapulae Response Twitch response elicited;Palpable increased muscle length   Rt   Rhomboids Response Twitch response elicited;Palpable increased muscle length   Rt   Gluteus Maximus Response Twitch response  elicited;Palpable increased muscle length   Rt               PT Education - 04/26/20 1636    Education Details use of heel lift & association with LBP    Person(s) Educated Patient    Methods Explanation    Comprehension Verbalized understanding;Need further instruction            PT Short Term Goals - 03/19/20 1717      PT SHORT TERM GOAL #1   Title Pt will be independent in postural correction    Status Achieved      PT SHORT TERM GOAL #2   Title pt will perform stretches at least 3 times daily  to be proactive about pain onset    Status Achieved             PT Long Term Goals - 04/12/20 1549      PT LONG TERM GOAL #1   Title Equal cervical rotation Rt to Lt without discomfort      PT LONG TERM GOAL #2   Title gross GHJ strength 5/5    Baseline gross Rt UE 4/5      PT LONG TERM GOAL #3   Title pt will be able to complete a work week without onset of HA or N/T    Baseline HA toward the end of the week when I feel my upper trap and cervical regions lock up. in my off week and in the first few days of work I do ok- as long as I do my exercises.      PT LONG TERM GOAL #4   Title pt will be able to carry and lift objects required for ADLs    Baseline it bothers my lower back a little when I lift with my Rt arm, feel the difference vs Lt      PT LONG TERM GOAL #5   Title Pt will be independent in long term HEP/gym program for continued strengthening    Baseline requires further progression    Status On-going                 Plan - 04/26/20 1634    Clinical Impression Statement Progressed exercises to include weight in long arc motions of UE. noted Lt rotation of L5 corrected with mobilization today but advised pt that LLD will shift this back. He felt that the lift was very helpful and we discussed wear time & effect on muscles. Noted Rt SB of cervical region in Rt UE long arc motion addressed by placing towel behind  head against wall.    PT  Treatment/Interventions ADLs/Self Care Home Management;Cryotherapy;Electrical Stimulation;Ultrasound;Traction;Moist Heat;Iontophoresis 4mg /ml Dexamethasone;Therapeutic activities;Therapeutic exercise;Neuromuscular re-education;Manual techniques;Patient/family education;Passive range of motion;Dry needling;Taping;Joint Manipulations;Spinal Manipulations    PT Next Visit Plan continue with weighted motions    PT Home Exercise Plan [id number]    Consulted and Agree with Plan of Care Patient           Patient will benefit from skilled therapeutic intervention in order to improve the following deficits and impairments:  Decreased range of motion, Impaired UE functional use, Increased muscle spasms, Decreased activity tolerance, Pain, Impaired flexibility, Improper body mechanics, Decreased strength, Impaired sensation, Postural dysfunction  Visit Diagnosis: Cervicalgia  Chronic right shoulder pain  Muscle weakness (generalized)  Abnormal posture     Problem List Patient Active Problem List   Diagnosis Date Noted  . Right shoulder injury 06/15/2018  . S/P arthroscopy of right shoulder 06/15/2018  . Failed total knee arthroplasty ([place]) 04/21/2018  . OA (osteoarthritis) of knee 12/08/2016  . 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 Cervical facet joint syndrome 12/17/2015  . Chronic bilateral low back pain without sciatica 12/17/2015  . Primary osteoarthritis of left knee 12/17/2015    Sinead Hockman C. Despina Boan PT, DPT 04/26/20 4:38 PM   [facility] [address] [place], [place], [address] Phone: [phone]   Fax:  [phone]  Name: TIGHE GITTO, MD MRN: [id number] Date of Birth: 1961-03-31

## 2020-04-30 ENCOUNTER — Ambulatory Visit: Payer: 59 | Admitting: Physical Therapy

## 2020-04-30 ENCOUNTER — Other Ambulatory Visit: Payer: Self-pay

## 2020-04-30 ENCOUNTER — Encounter: Payer: Self-pay | Admitting: Physical Therapy

## 2020-04-30 DIAGNOSIS — R293 Abnormal posture: Secondary | ICD-10-CM

## 2020-04-30 DIAGNOSIS — M542 Cervicalgia: Secondary | ICD-10-CM

## 2020-04-30 DIAGNOSIS — G8929 Other chronic pain: Secondary | ICD-10-CM

## 2020-04-30 DIAGNOSIS — M6281 Muscle weakness (generalized): Secondary | ICD-10-CM | POA: Diagnosis not present

## 2020-04-30 DIAGNOSIS — M25511 Pain in right shoulder: Secondary | ICD-10-CM | POA: Diagnosis not present

## 2020-05-02 ENCOUNTER — Encounter: Payer: Self-pay | Admitting: Physical Therapy

## 2020-05-02 NOTE — Therapy (Signed)
[place], [place], [address] Phone: [phone]   Fax:  [phone]  Physical Therapy Treatment  Patient Details  Name: Daniel ETHINGTON, MD MRN: [id number] Date of Birth: 1961-02-27 Referring Provider (PT): [name], MD   Encounter Date: 04/30/2020   PT End of Session - 05/02/20 1142    Visit Number 10    Number of Visits 22    Date for PT Re-Evaluation 06/08/20    Authorization Type UMR/Tric[employer]    PT Start Time 1546    PT Stop Time 1627    PT Time Calculation (min) 41 min    Activity Tolerance Patient tolerated treatment well    Behavior During Therapy [facility] for tasks assessed/performed           Past Medical History:  Diagnosis Date  . Anxiety   . Aortic atherosclerosis ([place])   . BPH (benign prostatic hyperplasia)   . Chronic pain syndrome    neck and low back  . DDD (degenerative disc disease), cervical   . Diverticulosis   . DJD (degenerative joint disease)   . Ganglion cyst    12 mm , right posterior knee  . Hyperlipidemia   . Internal hemorrhoids   . Migraines   . OA (osteoarthritis)    knees, hands, right shoulder  . Positive QuantiFERON-TB Gold test   . Shoulder pain   . Tear of right rotator cuff   . Type 2 diabetes mellitus ([place])    last A1c 5.4 on 04-13-2018 in epic (followed by pcp)  . Wears glasses     Past Surgical History:  Procedure Laterality Date  . ANAL FISSURE REPAIR    . ARTHOSCOPIC ROTAOR CUFF REPAIR Right 06/15/2018   Procedure: RIGHT SHOULDER ARTHROSCOPY, DEBRIDEMENT, SUBACROMIAL DECOMPRESSION, DISTAL CLAVICLE RESECTION, ROTATOR CUFF REPAIR;  Surgeon: [name], MD;  Location: [facility];  Service: Orthopedics;  Laterality: Right;  . COLONOSCOPY  05-26-2017   dr Henrene Pastor  . Hip Resurface  2006  . KNEE ARTHROSCOPY W/ MENISCAL REPAIR Bilateral right 1993; left 2010  . MASS EXCISION Right 04/21/2018   Procedure: EXCISION RIGHT THIGH SOFT  TISSUE MASS;  Surgeon: [name], MD;  [facility];  Service: Orthopedics;  Laterality: Right;  . SEPTOPLASTY    . SHOULDER ARTHROSCOPY WITH ROTATOR CUFF REPAIR Left   . SLAP Tear Repair  2008  . TONSILLECTOMY    . 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 TOTAL KNEE ARTHROPLASTY WITH REVISION COMPONENTS Left 04/21/2018   Procedure: Left knee polyethylene exchange ;  Surgeon: [name], MD;  [facility];  Service: Orthopedics;  Laterality: Left;    There were no vitals filed for this visit.   Subjective Assessment - 05/02/20 1141    Subjective Feeling looser today. Still wearing lift and back feels better.    Currently in Pain? Yes    Pain Score 4     Pain Location Shoulder    Pain Orientation Right    Pain Descriptors / Indicators Aching;Tightness    Aggravating Factors  long work hours    Pain Relieving Factors stretches, rest              [facility] PT Assessment - 05/02/20 0001      Assessment   Medical Diagnosis Rt shoulder pain    Referring Provider (PT) Herbie Baltimore  Theda Sers, MD    Onset Date/Surgical Date 06/15/18      Observation/Other Assessments   Focus on Therapeutic Outcomes (FOTO)  44% limited      Sensation   Additional Comments some N/T when working long hours      AROM   Right Shoulder Flexion 150 Degrees    Right Shoulder ABduction 154 Degrees    Right Shoulder Internal Rotation --   T8   Right Shoulder External Rotation --   T3   Cervical Flexion 32    Cervical Extension 42    Cervical - Right Side Bend 32    Cervical - Left Side Bend 32    Cervical - Right Rotation 64    Cervical - Left Rotation 66      Strength   Overall Strength Comments straight plane MMT 5/5, overhead strength and endurance still require further strengthening.     Right Shoulder Flexion --   20 repetitive flexion full range before  compensating   Right Shoulder ABduction --   8 repetitive full range and then did not reach full range                        [facility] Adult PT Treatment/Exercise - 05/02/20 0001      Shoulder Exercises: Seated   External Rotation Right    Theraband Level (Shoulder External Rotation) Level 2 (Red)    External Rotation Limitations abducted to 90 deg    Other Seated Exercises overhead press- narrow & open 5 lb 3x10 or stopping due to fatigue      Manual Therapy   Soft tissue mobilization IASTM Rt upper trap, levator, scalenes; Rt gluts lateral from SIJ                  PT Education - 05/02/20 1142    Education Details goals, HEP, FOTO, POC    Person(s) Educated Patient    Methods Explanation    Comprehension Verbalized understanding;Need further instruction            PT Short Term Goals - 03/19/20 1717      PT SHORT TERM GOAL #1   Title Pt will be independent in postural correction    Status Achieved      PT SHORT TERM GOAL #2   Title pt will perform stretches at least 3 times daily  to be proactive about pain onset    Status Achieved             PT Long Term Goals - 05/02/20 1143      PT LONG TERM GOAL #1   Title Equal cervical rotation Rt to Lt without discomfort    Baseline see flowsheet    Status Achieved      PT LONG TERM GOAL #2   Title gross GHJ strength 5/5    Status Achieved      PT LONG TERM GOAL #3   Title pt will be able to complete a work week without onset of HA or N/T    Baseline cont to have discomfort that is limiting by the end of the week    Status On-going      PT LONG TERM GOAL #4   Title pt will be able to carry and lift objects required for ADLs    Baseline okay below 90 deg. was unable to lift laundry detergent from high shelf    Status On-going      PT LONG TERM  GOAL #5   Title Pt will be independent in long term HEP/gym program for continued strengthening    Baseline requires further progression    Status  On-going                 Plan - 05/02/20 1151    Clinical Impression Statement Pt is making excellent progress in his ROM, posture and strength. Overhead strength is lacking and requires further attention for functional use. Another area that requires further attention is endurance as his shoulder is still limiting his ability to perform his work duties by the end of of his work week. Is still wearing heel lift and is very helpful to LBP.    PT Treatment/Interventions ADLs/Self Care Home Management;Cryotherapy;Electrical Stimulation;Ultrasound;Traction;Moist Heat;Iontophoresis 4mg /ml Dexamethasone;Therapeutic activities;Therapeutic exercise;Neuromuscular re-education;Manual techniques;Patient/family education;Passive range of motion;Dry needling;Taping;Joint Manipulations;Spinal Manipulations    PT Next Visit Plan continue with weighted motions- overhead focus    PT Home Exercise Plan [id number]    Consulted and Agree with Plan of Care Patient           Patient will benefit from skilled therapeutic intervention in order to improve the following deficits and impairments:  Decreased range of motion, Impaired UE functional use, Increased muscle spasms, Decreased activity tolerance, Pain, Impaired flexibility, Improper body mechanics, Decreased strength, Impaired sensation, Postural dysfunction  Visit Diagnosis: Cervicalgia  Chronic right shoulder pain  Muscle weakness (generalized)  Abnormal posture     Problem List Patient Active Problem List   Diagnosis Date Noted  . Right shoulder injury 06/15/2018  . S/P arthroscopy of right shoulder 06/15/2018  . Failed total knee arthroplasty ([place]) 04/21/2018  . OA (osteoarthritis) of knee 12/08/2016  . 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 Cervical facet joint syndrome 12/17/2015  . Chronic bilateral low back pain without sciatica 12/17/2015  . Primary osteoarthritis of left knee  12/17/2015    Levy Wellman C. Ugochukwu Chichester PT, DPT 05/02/20 11:56 AM   [place] [facility] [address] [place], [place], [address] Phone: [phone]   Fax:  [phone]  Name: ROBEL WUERTZ, MD MRN: [id number] Date of Birth: 1961/01/05

## 2020-05-03 DIAGNOSIS — M545 Low back pain: Secondary | ICD-10-CM | POA: Diagnosis not present

## 2020-05-03 DIAGNOSIS — M542 Cervicalgia: Secondary | ICD-10-CM | POA: Diagnosis not present

## 2020-05-03 DIAGNOSIS — M546 Pain in thoracic spine: Secondary | ICD-10-CM | POA: Diagnosis not present

## 2020-05-03 DIAGNOSIS — M9903 Segmental and somatic dysfunction of lumbar region: Secondary | ICD-10-CM | POA: Diagnosis not present

## 2020-05-10 ENCOUNTER — Ambulatory Visit: Payer: 59 | Admitting: Physical Therapy

## 2020-05-10 ENCOUNTER — Ambulatory Visit (INDEPENDENT_AMBULATORY_CARE_PROVIDER_SITE_OTHER): Payer: 59 | Admitting: Internal Medicine

## 2020-05-10 ENCOUNTER — Encounter: Payer: Self-pay | Admitting: Internal Medicine

## 2020-05-10 ENCOUNTER — Other Ambulatory Visit: Payer: Self-pay | Admitting: Internal Medicine

## 2020-05-10 VITALS — BP 128/76 | HR 108 | Ht 72.0 in | Wt 306.1 lb

## 2020-05-10 DIAGNOSIS — K648 Other hemorrhoids: Secondary | ICD-10-CM | POA: Diagnosis not present

## 2020-05-10 DIAGNOSIS — K5901 Slow transit constipation: Secondary | ICD-10-CM

## 2020-05-10 MED ORDER — HYDROCORTISONE ACETATE 25 MG RE SUPP
25.0000 mg | Freq: Every day | RECTAL | 4 refills | Status: DC
Start: 2020-05-10 — End: 2020-05-10

## 2020-05-10 MED FILL — HYDROCORTISONE ACETATE 25 M: 25 | 24 days supply | Qty: 24 | Fill #0

## 2020-05-10 NOTE — Progress Notes (Signed)
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  Patient was last seen in this office June 2018 regarding intermittent rectal bleeding and left lower quadrant pain.  Also worsening chronic constipation.  A CT scan of the abdomen pelvis with contrast revealed no acute abnormalities.  Possible constipation noted.  He then underwent complete colonoscopy January 24, 2017.  He was found to have pandiverticulosis and internal hemorrhoids.  No other abnormalities.  Follow-up recommended.  Tells me that about 6 weeks ago after going to defecate he noticed a protruding lesion around 5:00 rectum.  This was somewhat sore.  He was able to palpate with his fingertip.  Describes prolapsing.  This was about the size of a fingertip.  Uncomfortable with some blood.  Does have intermittent problems with constipation due to his needing pain medications for chronic pain.  His constipation can be so bad that he may need to manually disimpact, at times.  His GI review of systems is negative.  He has completed his Covid vaccination series  REVIEW OF SYSTEMS:  All non-GI ROS negative unless otherwise stated in the HPI except for back pain, fatigue, sleeping problems  Past Medical History:  Diagnosis Date  . Anxiety   . Aortic atherosclerosis ([place])   . BPH (benign prostatic hyperplasia)   . Chronic pain syndrome    neck and low back  . DDD (degenerative disc disease), cervical   . Diverticulosis   . DJD (degenerative joint disease)   . Ganglion cyst    12 mm , right posterior knee  . Hyperlipidemia   . Internal hemorrhoids   . Migraines   . OA (osteoarthritis)    knees, hands, right shoulder  . Positive QuantiFERON-TB Gold test   . Shoulder pain   . Tear of right rotator cuff   . Type 2 diabetes mellitus ([place])    last A1c 5.4 on 04-13-2018 in epic (followed by pcp)  . Wears glasses     Past Surgical  History:  Procedure Laterality Date  . ANAL FISSURE REPAIR    . ARTHOSCOPIC ROTAOR CUFF REPAIR Right 06/15/2018   Procedure: RIGHT SHOULDER ARTHROSCOPY, DEBRIDEMENT, SUBACROMIAL DECOMPRESSION, DISTAL CLAVICLE RESECTION, ROTATOR CUFF REPAIR;  Surgeon: [name], MD;  Location: [facility];  Service: Orthopedics;  Laterality: Right;  . COLONOSCOPY  05-26-2017   dr Henrene Pastor  . Hip Resurface  2006  . KNEE ARTHROSCOPY W/ MENISCAL REPAIR Bilateral right 1993; left 2010  . MASS EXCISION Right 04/21/2018   Procedure: EXCISION RIGHT THIGH SOFT TISSUE MASS;  Surgeon: [name], MD;  [facility];  Service: Orthopedics;  Laterality: Right;  . SEPTOPLASTY    . SHOULDER ARTHROSCOPY WITH ROTATOR CUFF REPAIR Left   . SLAP Tear Repair  2008  . TONSILLECTOMY    . 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 TOTAL KNEE ARTHROPLASTY WITH REVISION COMPONENTS Left 04/21/2018   Procedure: Left knee polyethylene exchange ;  Surgeon: [name], MD;  [facility];  Service: Orthopedics;  Laterality: Left;    Social History Daniel Bossier, 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 He has never used smokeless tobacco. He reports current alcohol use. He reports that he does not use drugs.  family history is not on file.  Allergies  Allergen Reactions  . Benzoin Compound Other (See Comments)    Blisters        PHYSICAL EXAMINATION: Vital signs: BP 128/76   Pulse (!) 108   Ht 6' (1.829 m)   Wt (!) 306 lb 2 oz (138.9 kg)   BMI 41.52 kg/m   Constitutional: generally well-appearing, no acute distress Psychiatric: alert and oriented x3, cooperative Eyes: anicteric Rectal: No external abnormalities.  No tenderness or mass.  Brown stool Skin: no lesions on visible extremities Neuro: No gross deficits  ANOSCOPY: Large friable internal hemorrhoids.   Brown stool.  No other abnormalities   ASSESSMENT:  1.  Symptomatic internal hemorrhoids with occasional prolapse 2.  Colonoscopy 2018 with pandiverticulosis hemorrhoids 3.  Chronic constipation   PLAN:  1.  Prescribe Anusol HC suppositories.;  #30; 1 per rectum at night; multiple refills 2.  Metamucil 2 tablespoons daily to help with constipation 3.  MiraLAX daily for constipation.  Titrate to need 4.  Contact the office if these measures are unhelpful.  He did discuss the office banding procedure and provided him brochure for his review.

## 2020-05-10 NOTE — Patient Instructions (Signed)
We have sent the following medications to your pharmacy for you to pick up at your convenience:  Anusol HC Suppositories.  You may take 2 tbsp of Metamucil daily  You may take Miralax daily

## 2020-05-14 ENCOUNTER — Other Ambulatory Visit: Payer: Self-pay | Admitting: Internal Medicine

## 2020-05-14 ENCOUNTER — Telehealth: Payer: Self-pay | Admitting: Internal Medicine

## 2020-05-14 MED ORDER — DICYCLOMINE HCL 20 MG PO TABS: ORAL_TABLET | ORAL | 3 refills | Status: DC

## 2020-05-14 MED FILL — DICYCLOMINE 20 MG TABLET: 20 | 10 days supply | Qty: 60 | Fill #0

## 2020-05-14 MED FILL — traMADol HCL 50 MG TABS: 50 | 30 days supply | Qty: 240 | Fill #0

## 2020-05-14 MED FILL — CYCLOBENZAPRINE HCL 10 MG T: 10 | 90 days supply | Qty: 270 | Fill #1

## 2020-05-14 MED FILL — BUTALB-ACETAMIN-CAFF 50-325: 50-325-40 | 10 days supply | Qty: 60 | Fill #0

## 2020-05-14 MED FILL — KETOCONAZOLE 2% CREAM: 2 | 20 days supply | Qty: 30 | Fill #1

## 2020-05-14 MED FILL — TOPIRAMATE 25 MG TAB: 25 | 90 days supply | Qty: 90 | Fill #2

## 2020-05-14 MED FILL — oxyCODONE HCL 5 MG TABS: 5 | 30 days supply | Qty: 180 | Fill #0

## 2020-05-14 MED FILL — RIZATRIPTAN 5 MG ODT: 5 | 30 days supply | Qty: 15 | Fill #1

## 2020-05-14 MED FILL — diazePAM 5 MG TABS: 5 | 30 days supply | Qty: 60 | Fill #1

## 2020-05-14 MED FILL — metFORMIN HCL 1000 MG TABS: 1000 | 90 days supply | Qty: 180 | Fill #2

## 2020-05-14 NOTE — Telephone Encounter (Signed)
Patient called states he is having a lot of cramps, diarrhea and bleeding has stopped aspirin and celebrex. Wants to know if he can get Protonix.

## 2020-05-14 NOTE — Telephone Encounter (Signed)
1.  Sounds like an acute viral illness with diarrhea.  The bleeding is secondary to irritated hemorrhoids as identified on anoscopy during the recent office evaluation 2.  Protonix will be held for acid peptic disorder such as GERD or gastric ulcers.  I do not think this is the problem 3.  Can prescribe Bentyl 20 mg every 4-6 hours as needed for cramping 4.  Okay to start Metamucil when diarrhea slows up

## 2020-05-14 NOTE — Telephone Encounter (Signed)
Spoke with pt and he is aware. Prescription sent to pharmacy.

## 2020-05-14 NOTE — Telephone Encounter (Signed)
Pt stated Dr. Henrene Pastor wanted him to call with any changes. States since Thursday he has been having cramping and diarrhea. States he had lots of BRBPR and almost went to the ER Saturday. He reports he stopped his ASA and Celebrex and has not had any bleeding today. He states he has more cramping and diarrhea on his weeks off. Pt has not started the metamucil yet, reports he didn't know if he should since he was having the diarrhea and bleeding. Pt also wanted to know if Dr. Henrene Pastor thinks he may need Protonix. Pt is not having any reflux symptoms. Please advise.

## 2020-05-24 ENCOUNTER — Encounter: Payer: Self-pay | Admitting: Physical Medicine & Rehabilitation

## 2020-05-24 ENCOUNTER — Encounter: Payer: 59 | Attending: Physical Medicine & Rehabilitation | Admitting: Physical Medicine & Rehabilitation

## 2020-05-24 ENCOUNTER — Other Ambulatory Visit: Payer: Self-pay

## 2020-05-24 VITALS — BP 140/84 | HR 90 | Temp 98.8°F | Ht 72.0 in | Wt 310.8 lb

## 2020-05-24 DIAGNOSIS — G43719 Chronic migraine without aura, intractable, without status migrainosus: Secondary | ICD-10-CM | POA: Diagnosis not present

## 2020-05-24 NOTE — Progress Notes (Signed)
Botox injection for chronic migraine prophylaxis.  Indication: History of migraines with greater than 15 headaches per month despite trials of oral medications.  Informed consent was obtained after discussing risks and benefits of the procedure with the patient this included bleeding bruising infection as well as facial drooping, eyelid lag, systemic effects of Botox. Patient elects to proceed and has given written consent.  Last Botox May 2019- headache frequency increased  Patient placed in a seated position Dilution 50 units per cc  Muscles injected with dose  Procerus 5 units Corrugator 5 units bilateral Frontalis 5 units 2 injection sites on the right and 2 injection sites on the left Temporalis 5 units in 4 injection sites on the right and 4 injection sites on the left Occipitalis 5 units into 3 injections sites on the right and 3 injection sites on the left  Cervical paraspinals 5 units into 2 injection sites on the right and 2 injection sites on the left trapezius 5 units into 3 injection sites on the right and 3 injection sites on the left bilateral levator scapula, 5 units bilaterally Patient tolerated procedure well. Post procedure instructions given.  Right L4 Paraspinal 25U Appointment for repeat injection , pt will call as he gets >6 mo relief from treatment  Wasted 10U

## 2020-05-24 NOTE — Patient Instructions (Signed)
You received a botulinum toxin injection for chronic headache prevention. You may have soreness in your injection site, you may use ice for 20-30 minutes every 2 hours to help with soreness.  Maximum effect of the injection will be at around 1 week. Other side effects include possible muscle weakness which will subside as the medication wears off.  Medication may be repeated every 3 months as needed. 

## 2020-05-25 ENCOUNTER — Ambulatory Visit: Payer: 59 | Attending: Specialist | Admitting: Physical Therapy

## 2020-05-25 ENCOUNTER — Encounter: Payer: Self-pay | Admitting: Physical Therapy

## 2020-05-25 DIAGNOSIS — M25511 Pain in right shoulder: Secondary | ICD-10-CM | POA: Insufficient documentation

## 2020-05-25 DIAGNOSIS — M6281 Muscle weakness (generalized): Secondary | ICD-10-CM | POA: Diagnosis not present

## 2020-05-25 DIAGNOSIS — G8929 Other chronic pain: Secondary | ICD-10-CM | POA: Diagnosis not present

## 2020-05-25 DIAGNOSIS — R293 Abnormal posture: Secondary | ICD-10-CM | POA: Insufficient documentation

## 2020-05-25 DIAGNOSIS — M542 Cervicalgia: Secondary | ICD-10-CM | POA: Diagnosis not present

## 2020-05-25 NOTE — Therapy (Signed)
[place], [place], [address] Phone: [phone]   Fax:  [phone]  Physical Therapy Treatment  Patient Details  Name: Daniel DISANO, [name] MRN: [id number] Date of Birth: [birth year] Referring Provider (PT): [name], [name]   Encounter Date: 05/25/2020   PT End of Session - 05/25/20 0939    Visit Number 11    Number of Visits 22    Date for PT Re-Evaluation 06/08/20    Authorization Type UMR/[employer]ricare    PT Start Time 0932    PT Stop Time 1014    PT Time Calculation (min) 42 min    Activity Tolerance Patient tolerated treatment well    Behavior During Therapy [facility] for tasks assessed/performed           Past Medical History:  Diagnosis Date  . Anxiety   . Aortic atherosclerosis ([place])   . BPH (benign prostatic hyperplasia)   . Chronic pain syndrome    neck and low back  . DDD (degenerative disc disease), cervical   . Diverticulosis   . DJD (degenerative joint disease)   . Ganglion cyst    12 mm , right posterior knee  . Hyperlipidemia   . Internal hemorrhoids   . Migraines   . OA (osteoarthritis)    knees, hands, right shoulder  . Positive QuantiFERON-TB Gold test   . Shoulder pain   . Tear of right rotator cuff   . Type 2 diabetes mellitus ([place])    last A1c 5.4 on 04-13-2018 in epic (followed by pcp)  . Wears glasses     Past Surgical History:  Procedure Laterality Date  . ANAL FISSURE REPAIR    . ARTHOSCOPIC ROTAOR CUFF REPAIR Right 06/15/2018   Procedure: RIGHT SHOULDER ARTHROSCOPY, DEBRIDEMENT, SUBACROMIAL DECOMPRESSION, DISTAL CLAVICLE RESECTION, ROTATOR CUFF REPAIR;  Surgeon: [name], [name];  Location: [facility];  Service: Orthopedics;  Laterality: Right;  . COLONOSCOPY  05-26-2017   dr Henrene Pastor  . Hip Resurface  2006  . KNEE ARTHROSCOPY W/ MENISCAL REPAIR Bilateral right 1993; left 2010  . MASS EXCISION Right 04/21/2018   Procedure: EXCISION RIGHT THIGH SOFT  TISSUE MASS;  Surgeon: [name], [name];  [facility];  Service: Orthopedics;  Laterality: Right;  . SEPTOPLASTY    . SHOULDER ARTHROSCOPY WITH ROTATOR CUFF REPAIR Left   . SLAP Tear Repair  2008  . TONSILLECTOMY    . TOTAL KNEE ARTHROPLASTY Left 12/08/2016   Procedure: LEFT TOTAL KNEE ARTHROPLASTY;  Surgeon: [name], [name];  [facility];  Service: Orthopedics;  Laterality: Left;  . TOTAL KNEE ARTHROPLASTY WITH REVISION COMPONENTS Left 04/21/2018   Procedure: Left knee polyethylene exchange ;  Surgeon: [name], [name];  [facility];  Service: Orthopedics;  Laterality: Left;    There were no vitals filed for this visit.   Subjective Assessment - 05/25/20 0935    Subjective Botox yesterday that usually takes about 3 days to kick in. Shoulder has good movement but is sore. Has been running like crazy at work so back is sore. Lift is helpful.    Currently in Pain? Yes    Pain Score 6     Pain Location Shoulder    Pain Descriptors / Indicators Sore    Multiple Pain Sites Yes    Pain Score 7    Pain Location Back    Pain Orientation Right;Lower    Pain Descriptors / Indicators Sore  [facility] Adult PT Treatment/Exercise - 05/25/20 0001      Shoulder Exercises: Standing   Theraband Level (Shoulder External Rotation) Level 4 (Blue)    External Rotation Limitations low row to external rotation    Theraband Level (Shoulder Extension) Level 4 (Blue)    Extension Limitations palms down    Theraband Level (Shoulder Row) Level 4 (Blue)    Row Limitations wide row    Other Standing Exercises plyometric push ups to low trap set      Shoulder Exercises: ROM/Strengthening   Nustep 5 min UE & LE      Manual Therapy   Joint Mobilization bil first rib depression    Soft tissue mobilization all muscles that were TPDN            Trigger Point Dry Needling - 05/25/20 0001    Upper Trapezius Response Twitch reponse  elicited;Palpable increased muscle length   Rt   Suboccipitals Response Twitch response elicited;Palpable increased muscle length   Rt   Levator Scapulae Response Twitch response elicited;Palpable increased muscle length   Rt   Gluteus Maximus Response Twitch response elicited;Palpable increased muscle length   Rt   Piriformis Response Twitch response elicited;Palpable increased muscle length   Rt                 PT Short Term Goals - 03/19/20 1717      PT SHORT TERM GOAL #1   Title Pt will be independent in postural correction    Status Achieved      PT SHORT TERM GOAL #2   Title pt will perform stretches at least 3 times daily  to be proactive about pain onset    Status Achieved             PT Long Term Goals - 05/02/20 1143      PT LONG TERM GOAL #1   Title Equal cervical rotation Rt to Lt without discomfort    Baseline see flowsheet    Status Achieved      PT LONG TERM GOAL #2   Title gross GHJ strength 5/5    Status Achieved      PT LONG TERM GOAL #3   Title pt will be able to complete a work week without onset of HA or N/T    Baseline cont to have discomfort that is limiting by the end of the week    Status On-going      PT LONG TERM GOAL #4   Title pt will be able to carry and lift objects required for ADLs    Baseline okay below 90 deg. was unable to lift laundry detergent from high shelf    Status On-going      PT LONG TERM GOAL #5   Title Pt will be independent in long term HEP/gym program for continued strengthening    Baseline requires further progression    Status On-going                 Plan - 05/25/20 1029    Clinical Impression Statement Pt is maintaining ROM of shoulder with some tightness reported at end ranges. DN still beneficial to reduce overuse of upper traps as he becomes very tight after long weeks at work. Scapular control continues to improve with challenges.    PT Treatment/Interventions ADLs/Self Care Home  Management;Cryotherapy;Electrical Stimulation;Ultrasound;Traction;Moist Heat;Iontophoresis 4mg /ml Dexamethasone;Therapeutic activities;Therapeutic exercise;Neuromuscular re-education;Manual techniques;Patient/family education;Passive range of motion;Dry needling;Taping;Joint Manipulations;Spinal Manipulations    PT Next Visit Plan re-eval?  PT Home Exercise Plan [id number]    Consulted and Agree with Plan of Care Patient           Patient will benefit from skilled therapeutic intervention in order to improve the following deficits and impairments:  Decreased range of motion, Impaired UE functional use, Increased muscle spasms, Decreased activity tolerance, Pain, Impaired flexibility, Improper body mechanics, Decreased strength, Impaired sensation, Postural dysfunction  Visit Diagnosis: Cervicalgia  Chronic right shoulder pain  Muscle weakness (generalized)  Abnormal posture     Problem List Patient Active Problem List   Diagnosis Date Noted  . Right shoulder injury 06/15/2018  . S/P arthroscopy of right shoulder 06/15/2018  . Failed total knee arthroplasty ([place]) 04/21/2018  . OA (osteoarthritis) of knee 12/08/2016  . 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 Cervical facet joint syndrome 12/17/2015  . Chronic bilateral low back pain without sciatica 12/17/2015  . Primary osteoarthritis of left knee 12/17/2015    [place]le C. Cheresa Siers PT, DPT 05/25/20 10:34 AM   [place] [facility] [address] [place], [place], [address] Phone: [phone]   Fax:  [phone]  Name: Daniel ZION, [name] MRN: [id number] Date of Birth: Feb 25, 1961

## 2020-05-29 ENCOUNTER — Other Ambulatory Visit: Payer: Self-pay

## 2020-05-29 ENCOUNTER — Emergency Department (HOSPITAL_COMMUNITY)
Admission: EM | Admit: 2020-05-29 | Discharge: 2020-05-30 | Disposition: A | Discharge status: Home / Self Care | Payer: 59 | Attending: Emergency Medicine | Admitting: Emergency Medicine

## 2020-05-29 ENCOUNTER — Encounter (HOSPITAL_COMMUNITY): Payer: Self-pay

## 2020-05-29 DIAGNOSIS — K469 Unspecified abdominal hernia without obstruction or gangrene: Secondary | ICD-10-CM | POA: Diagnosis not present

## 2020-05-29 DIAGNOSIS — R1084 Generalized abdominal pain: Secondary | ICD-10-CM | POA: Insufficient documentation

## 2020-05-29 DIAGNOSIS — R197 Diarrhea, unspecified: Secondary | ICD-10-CM | POA: Diagnosis not present

## 2020-05-29 DIAGNOSIS — Z20822 Contact with and (suspected) exposure to covid-19: Secondary | ICD-10-CM | POA: Diagnosis not present

## 2020-05-29 DIAGNOSIS — I700 Atherosclerosis of aorta: Secondary | ICD-10-CM | POA: Diagnosis not present

## 2020-05-29 DIAGNOSIS — E119 Type 2 diabetes mellitus without complications: Secondary | ICD-10-CM | POA: Insufficient documentation

## 2020-05-29 DIAGNOSIS — Z96652 Presence of left artificial knee joint: Secondary | ICD-10-CM | POA: Diagnosis not present

## 2020-05-29 DIAGNOSIS — Z7984 Long term (current) use of oral hypoglycemic drugs: Secondary | ICD-10-CM | POA: Diagnosis not present

## 2020-05-29 DIAGNOSIS — K5730 Diverticulosis of large intestine without perforation or abscess without bleeding: Secondary | ICD-10-CM | POA: Diagnosis not present

## 2020-05-29 DIAGNOSIS — M247 Protrusio acetabuli: Secondary | ICD-10-CM | POA: Diagnosis not present

## 2020-05-29 LAB — CBC
HCT: 46.3 % (ref 39.0–52.0)
Hemoglobin: 14.8 g/dL (ref 13.0–17.0)
MCH: 30.5 pg (ref 26.0–34.0)
MCHC: 32 g/dL (ref 30.0–36.0)
MCV: 95.3 fL (ref 80.0–100.0)
Platelets: 425 10*3/uL — ABNORMAL HIGH (ref 150–400)
RBC: 4.86 MIL/uL (ref 4.22–5.81)
RDW: 14.6 % (ref 11.5–15.5)
WBC: 11.8 10*3/uL — ABNORMAL HIGH (ref 4.0–10.5)
nRBC: 0 % (ref 0.0–0.2)

## 2020-05-29 LAB — COMPREHENSIVE METABOLIC PANEL
ALT: 25 U/L (ref 0–44)
AST: 21 U/L (ref 15–41)
Albumin: 3.9 g/dL (ref 3.5–5.0)
Alkaline Phosphatase: 70 U/L (ref 38–126)
Anion gap: 9 (ref 5–15)
BUN: 11 mg/dL (ref 6–20)
CO2: 21 mmol/L — ABNORMAL LOW (ref 22–32)
Calcium: 9 mg/dL (ref 8.9–10.3)
Chloride: 111 mmol/L (ref 98–111)
Creatinine, Ser: 0.95 mg/dL (ref 0.61–1.24)
GFR calc Af Amer: >60 mL/min (ref >60)
GFR calc non Af Amer: >60 mL/min (ref >60)
Glucose, Bld: 97 mg/dL (ref 70–99)
Potassium: 4.2 mmol/L (ref 3.5–5.1)
Sodium: 141 mmol/L (ref 135–145)
Total Bilirubin: 0.6 mg/dL (ref 0.3–1.2)
Total Protein: 7.1 g/dL (ref 6.5–8.1)

## 2020-05-29 LAB — LIPASE, BLOOD: Lipase: 29 U/L (ref 11–51)

## 2020-05-29 NOTE — ED Triage Notes (Signed)
Patient complains of diarrhea x 3 months, has been seen by GI MD and told he had prolapsed hemorrhoid that is causing some blood in the diarrhea. Taking metamucil as prescribed and told by GI to continue with medication. Complains of abdominal pain after any solid intake, complains of cramping

## 2020-05-30 ENCOUNTER — Emergency Department (HOSPITAL_COMMUNITY): Payer: 59

## 2020-05-30 DIAGNOSIS — Z20822 Contact with and (suspected) exposure to covid-19: Secondary | ICD-10-CM | POA: Diagnosis not present

## 2020-05-30 DIAGNOSIS — R1084 Generalized abdominal pain: Secondary | ICD-10-CM | POA: Diagnosis not present

## 2020-05-30 DIAGNOSIS — K5730 Diverticulosis of large intestine without perforation or abscess without bleeding: Secondary | ICD-10-CM | POA: Diagnosis not present

## 2020-05-30 DIAGNOSIS — R197 Diarrhea, unspecified: Secondary | ICD-10-CM | POA: Diagnosis not present

## 2020-05-30 DIAGNOSIS — Z96652 Presence of left artificial knee joint: Secondary | ICD-10-CM | POA: Diagnosis not present

## 2020-05-30 DIAGNOSIS — E119 Type 2 diabetes mellitus without complications: Secondary | ICD-10-CM | POA: Diagnosis not present

## 2020-05-30 DIAGNOSIS — K469 Unspecified abdominal hernia without obstruction or gangrene: Secondary | ICD-10-CM | POA: Diagnosis not present

## 2020-05-30 DIAGNOSIS — I700 Atherosclerosis of aorta: Secondary | ICD-10-CM | POA: Diagnosis not present

## 2020-05-30 DIAGNOSIS — Z7984 Long term (current) use of oral hypoglycemic drugs: Secondary | ICD-10-CM | POA: Diagnosis not present

## 2020-05-30 DIAGNOSIS — M247 Protrusio acetabuli: Secondary | ICD-10-CM | POA: Diagnosis not present

## 2020-05-30 LAB — SARS CORONAVIRUS 2 BY RT PCR (HOSPITAL ORDER, PERFORMED IN ~~LOC~~ HOSPITAL LAB): SARS Coronavirus 2: NEGATIVE

## 2020-05-30 MED ORDER — HYOSCYAMINE SULFATE 0.125 MG PO TABS: 0.1250 mg | ORAL_TABLET | Freq: Once | ORAL | Status: DC

## 2020-05-30 MED ORDER — HYOSCYAMINE SULFATE SL 0.125 MG SL SUBL
1.0000 | SUBLINGUAL_TABLET | Freq: Four times a day (QID) | SUBLINGUAL | 0 refills | Status: DC | PRN
Start: 2020-05-30 — End: 2021-06-03

## 2020-05-30 MED ORDER — HYOSCYAMINE SULFATE 0.125 MG SL SUBL
0.1250 mg | SUBLINGUAL_TABLET | Freq: Once | SUBLINGUAL | Status: AC
  Administered 2020-05-30: 0.125 mg via SUBLINGUAL
  Filled 2020-05-30: qty 1

## 2020-05-30 NOTE — ED Notes (Signed)
Verbalized understanding of DC instructions, Rx, follow up care with GI/PCP

## 2020-05-30 NOTE — ED Provider Notes (Signed)
[facility] EMERGENCY DEPARTMENT Provider Note  CSN: [id number] Arrival date & time: 05/29/20 1201  Chief Complaint(s) No chief complaint on file.  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  Abdominal Cramping This is a new problem. Episode onset: 3-4 months. Episode frequency: intermittent. Progression since onset: fluctuating. Pertinent negatives include no chest pain, no headaches and no shortness of breath. The symptoms are aggravated by eating. Relieved by: bentyl.   Followed by GI who made some medication changes that have not helped.  Known h/o diverticulosis and internal hemorrhoids.  Past Medical History Past Medical History:  Diagnosis Date   Anxiety    Aortic atherosclerosis (HCC)    BPH (benign prostatic hyperplasia)    Chronic pain syndrome    neck and low back   DDD (degenerative disc disease), cervical    Diverticulosis    DJD (degenerative joint disease)    Ganglion cyst    12 mm , right posterior knee   Hyperlipidemia    Internal hemorrhoids    Migraines    OA (osteoarthritis)    knees, hands, right shoulder   Positive QuantiFERON-TB Gold test    Shoulder pain    Tear of right rotator cuff    Type 2 diabetes mellitus ([place])    last A1c 5.4 on 04-13-2018 in epic (followed by pcp)   Wears glasses    Patient Active Problem List   Diagnosis Date Noted   Right shoulder injury 06/15/2018   S/P arthroscopy of right shoulder 06/15/2018   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 Authorizing  Provider  butalbital-acetaminophen-caffeine (FIORICET, ESGIC) 50-325-40 MG per tablet Take 1-2 tablets by mouth every 4 (four) hours as needed for headache.     [provider]  CIALIS 5 MG tablet Take 5 mg by mouth every evening.  10/09/16   [provider]  dicyclomine (BENTYL) 20 MG tablet Take 1 by mouth every 4-6 hours as needed for cramping 05/14/20   [name], MD  fluticasone [facility]) 50 MCG/ACT nasal spray Place 2 sprays into both nostrils daily as needed for allergies.     [provider]  hydrocortisone (ANUSOL-HC) 25 MG suppository Place 1 suppository (25 mg total) rectally at bedtime. 05/10/20   [name], MD  Hyoscyamine Sulfate SL (LEVSIN/SL) 0.125 MG SUBL Place 1 each under the tongue 4 (four) times daily as needed for up to 5 days. 05/30/20 06/04/20  [name], MD  ketoconazole (NIZORAL) 2 % cream Apply 1 application topically 2 (two) times daily as needed for irritation.     [provider]  lidocaine (LIDODERM) 5 % Place 1 patch onto the skin daily. Remove & Discard patch within 12 hours or as directed by MD    [provider]  metFORMIN (GLUCOPHAGE) 1000 MG tablet Take 1,000 mg by mouth 2 (two) times daily with a meal.    [provider]  methocarbamol (ROBAXIN-750) 750 MG tablet Take 1 tablet (750 mg total) by mouth every 8 (eight) hours as needed for muscle spasms. 06/15/18   Stilwell,  Bryson L, PA-C  OVER THE COUNTER MEDICATION Take 1 capsule by mouth 2 (two) times daily. Super Beta Prostate Supplement    [provider]  oxymetazoline (AFRIN) 0.05 % nasal spray Place 2 sprays into both nostrils 2 (two) times daily as needed for congestion.     [provider]  pravastatin (PRAVACHOL) 40 MG tablet Take 40 mg by mouth every evening.  01/25/18   [provider]  pseudoephedrine (SUDAFED) 120 MG 12 hr tablet Take 120 mg by mouth daily as needed for congestion.    [provider]    rizatriptan (MAXALT-MLT) 5 MG disintegrating tablet Take 5 mg by mouth as needed for migraine.  03/25/18   [provider]  SUMAtriptan (IMITREX) 50 MG tablet Take 50 mg by mouth every 2 (two) hours as needed for migraine or headache. May repeat in 2 hours if headache persists or recurs.    [provider]  tamsulosin (FLOMAX) 0.4 MG CAPS capsule Take 0.4 mg by mouth 2 (two) times daily.     [provider]  topiramate (TOPAMAX) 25 MG tablet Take 25 mg by mouth at bedtime.     [provider]  zolpidem (AMBIEN) 10 MG tablet Take 10 mg by mouth at bedtime as needed for sleep.    [provider]                                                                                                                                    Past Surgical History Past Surgical History:  Procedure Laterality Date   ANAL FISSURE REPAIR     ARTHOSCOPIC ROTAOR CUFF REPAIR Right 06/15/2018   Procedure: RIGHT SHOULDER ARTHROSCOPY, DEBRIDEMENT, SUBACROMIAL DECOMPRESSION, DISTAL CLAVICLE RESECTION, ROTATOR CUFF REPAIR;  Surgeon: [name], MD;  Location: [facility];  Service: Orthopedics;  Laterality: Right;   COLONOSCOPY  05-26-2017   dr Henrene Pastor   Hip Resurface  2006   KNEE ARTHROSCOPY W/ MENISCAL REPAIR Bilateral right 1993; left 2010   MASS EXCISION Right 04/21/2018   Procedure: EXCISION RIGHT THIGH SOFT TISSUE MASS;  Surgeon: [name], MD;  Location: [facility];  Service: Orthopedics;  Laterality: Right;   SEPTOPLASTY     SHOULDER ARTHROSCOPY WITH ROTATOR CUFF REPAIR Left    SLAP Tear Repair  2008   TONSILLECTOMY     TOTAL KNEE ARTHROPLASTY Left 12/08/2016   Procedure: LEFT TOTAL KNEE ARTHROPLASTY;  Surgeon: [name], MD;  Location: [facility];  Service: Orthopedics;  Laterality: Left;   TOTAL KNEE ARTHROPLASTY WITH REVISION COMPONENTS Left 04/21/2018   Procedure: Left knee polyethylene exchange ;  Surgeon: [name], MD;  Location: [facility];  Service: Orthopedics;  Laterality: Left;   Family History No family history on file.  Social History Social History   Tobacco Use   Smoking status: Never Smoker   Smokeless tobacco: Never Used  Scientific laboratory technician Use: Never  used  Substance Use Topics   Alcohol use: Yes    Comment: seldom   Drug use: Never   Allergies Benzoin compound  Review of Systems Review of Systems  Respiratory: Negative for shortness of breath.   Cardiovascular: Negative for chest pain.  Gastrointestinal: Positive for blood in stool (but resolved since being off of celebrex) and diarrhea.  Neurological: Negative for headaches.   All other systems are reviewed and are negative for acute change except as noted in the HPI  Physical Exam Vital Signs  I have reviewed the triage vital signs BP (!) 141/90 (BP Location: Right Arm)    Pulse 80    Temp 98.3 F (36.8 C) (Oral)    Resp 16    Ht 6' (1.829 m)    Wt 133.8 kg    SpO2 98%    BMI 40.01 kg/m   Physical Exam Vitals reviewed.  Constitutional:      General: He is not in acute distress.    Appearance: He is well-developed. He is not diaphoretic.  HENT:     Head: Normocephalic and atraumatic.     Jaw: No trismus.     Right Ear: External ear normal.     Left Ear: External ear normal.     Nose: Nose normal.  Eyes:     General: No scleral icterus.    Conjunctiva/sclera: Conjunctivae normal.  Neck:     Trachea: Phonation normal.  Cardiovascular:     Rate and Rhythm: Normal rate and regular rhythm.  Pulmonary:     Effort: Pulmonary effort is normal. No respiratory distress.     Breath sounds: No stridor.  Abdominal:     General: There is no distension.  Musculoskeletal:        General: Normal range of motion.     Cervical back: Normal range of motion.  Neurological:     Mental Status: He is alert and oriented to person, place, and time.  Psychiatric:        Behavior: Behavior normal.     ED Results and Treatments Labs (all  labs ordered are listed, but only abnormal results are displayed) Labs Reviewed  COMPREHENSIVE METABOLIC PANEL - Abnormal; Notable for the following components:      Result Value   CO2 21 (*)    All other components within normal limits  CBC - Abnormal; Notable for the following components:   WBC 11.8 (*)    Platelets 425 (*)    All other components within normal limits  SARS CORONAVIRUS 2 BY RT PCR (HOSPITAL ORDER, [facility])  LIPASE, BLOOD                                                                                                                         EKG  EKG Interpretation  Date/Time:    Ventricular Rate:    PR Interval:    QRS Duration:   QT Interval:    QTC Calculation:  R Axis:     Text Interpretation:        Radiology CT ABDOMEN PELVIS WO CONTRAST  Result Date: 05/30/2020 CLINICAL DATA:  Diverticulitis suspected, 3 months of diarrhea, prolapsing hemorrhoid resulting in blood within the diarrhea EXAM: CT ABDOMEN AND PELVIS WITHOUT CONTRAST TECHNIQUE: Multidetector CT imaging of the abdomen and pelvis was performed following the standard protocol without IV contrast. COMPARISON:  CT 04/01/2017 FINDINGS: Lower chest: Lung bases are clear. Small fat containing Bochdalek's hernias similar to comparisons. Normal heart size. No pericardial effusion. Hepatobiliary: No visible concerning liver lesion on this unenhanced CT. Diffuse hepatic hypoattenuation compatible with hepatic steatosis. Mild sparing along the gallbladder fossa. Smooth liver surface contour. Gallbladder and biliary tree are unremarkable. Pancreas: Unremarkable. No pancreatic ductal dilatation or surrounding inflammatory changes. Spleen: Normal in size. No concerning splenic lesions. Adrenals/Urinary Tract: Normal adrenal glands. Kidneys are symmetric in size and normally located. No visible concerning renal lesion. No urolithiasis or hydronephrosis. Distal portions of the ureters and  bladder largely obscured by streak artifact from right hip prosthesis. Visible portions are unremarkable. Stomach/Bowel: Distal esophagus, stomach and duodenal sweep are unremarkable. No small bowel wall thickening or dilatation. No evidence of obstruction. A normal appendix is visualized. No colonic dilatation or wall thickening. Scattered colonic diverticula without focal inflammation to suggest diverticulitis. Portion of the rectosigmoid partially obscured by streak artifact. Vascular/Lymphatic: Atherosclerotic calcifications within the abdominal aorta and branch vessels. No aneurysm or ectasia. No enlarged abdominopelvic lymph nodes. Reproductive: Suspect some prostatomegaly with the prostate is largely obscured by streak artifact. Other: No abdominopelvic free fluid or air is visible. Mild posterior body wall edema. Additional injection granulomata seen in the posterior soft tissues. No bowel containing hernias. Musculoskeletal: Multilevel degenerative changes are present in the imaged portions of the spine. Features are maximal T11-12 and L5-S1. Prior right hip hemiarthroplasty without evidence of acute hardware complication. Some mild right acetabular protrusio is noted overall similar to the comparison CT from 2018. No acute osseous abnormality or suspicious osseous lesion. IMPRESSION: 1. Portions of the pelvis are obscured by streak artifact from a right hip hemiarthroplasty. 2. No visible acute intra-abdominal process to provide cause for patient's symptoms. 3. Evidence of diverticulosis without convincing CT evidence of diverticulitis at this time. 4. Hepatic steatosis. 5. Right hip hemiarthroplasty with acetabular protrusio similar to comparison exam. 6. Aortic Atherosclerosis (ICD10-I70.0). Electronically Signed   By: Lovena Le M.D.   On: 05/30/2020 01:01    Pertinent labs & imaging results that were available during my care of the patient were reviewed by me and considered in my medical decision  making (see chart for details).  Medications Ordered in ED Medications  hyoscyamine (LEVSIN SL) SL tablet 0.125 mg (has no administration in time range)  Procedures Procedures  (including critical care time)  Medical Decision Making / ED Course I have reviewed the nursing notes for this encounter and the patient's prior records (if available in EHR or on provided paperwork).   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 He was evaluated in the context of the global COVID-19 pandemic, which necessitated consideration that the patient might be at risk for infection with the SARS-CoV-2 virus that causes COVID-19. Institutional protocols and algorithms that pertain to the evaluation of patients at risk for COVID-19 are in a state of rapid change based on information released by regulatory bodies including the CDC and federal and state organizations. These policies and algorithms were followed during the patient's care in the ED.  Persistent diarrhea with intermittent abd cramping. AFVSS.  Labs with mild leukocytosis. Otherwise reassuring without evidence of biliary obstruction or pancreatitis.  No significant electrolyte derangements or renal sufficiency. CT scan showed diverticulosis without evidence of diverticulitis.  No evidence of enteritis or colitis.  Recommended patient follow-up with PCP/GI for further evaluation including GI panel/O&P.  Also recommended patient decreased consumption of acidic foods or foods containing organophosphate pesticides.  Provided with Levsin.  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  Test obtained.  Results can be followed up on MyChart.      Final Clinical Impression(s) / ED Diagnoses Final  diagnoses:  Diarrhea, unspecified type  Abdominal cramping, generalized   The patient appears reasonably screened and/or stabilized for discharge and I doubt any other medical condition or other [facility] requiring further screening, evaluation, or treatment in the ED at this time prior to discharge. Safe for discharge with strict return precautions.  Disposition: Discharge  Condition: Good  I have discussed the results, Dx and Tx plan with the patient/family who expressed understanding and agree(s) with the plan. Discharge instructions discussed at length. The patient/family was given strict return precautions who verbalized understanding of the instructions. No further questions at time of discharge.    ED Discharge Orders         Ordered    Hyoscyamine Sulfate SL (LEVSIN/SL) 0.125 MG SUBL  4 times daily PRN     Discontinue  Reprint     05/30/20 0540            Follow Up: [name], MD [place] [address] [phone]  Schedule an appointment as soon as possible for a visit  If symptoms do not improve or  worsen  [name], MD [address] [place] [address] [phone]  Schedule an appointment as soon as possible for a visit  As needed      This chart was dictated using voice recognition software.  Despite best efforts to proofread,  errors can occur which can change the documentation meaning.   [name], MD 05/30/20 [phone]

## 2020-05-31 ENCOUNTER — Encounter: Payer: Self-pay | Admitting: Internal Medicine

## 2020-05-31 ENCOUNTER — Ambulatory Visit (INDEPENDENT_AMBULATORY_CARE_PROVIDER_SITE_OTHER): Payer: 59 | Admitting: Internal Medicine

## 2020-05-31 ENCOUNTER — Other Ambulatory Visit: Payer: 59

## 2020-05-31 VITALS — BP 126/82 | HR 86 | Ht 72.0 in | Wt 298.0 lb

## 2020-05-31 DIAGNOSIS — K648 Other hemorrhoids: Secondary | ICD-10-CM | POA: Diagnosis not present

## 2020-05-31 DIAGNOSIS — K625 Hemorrhage of anus and rectum: Secondary | ICD-10-CM | POA: Diagnosis not present

## 2020-05-31 DIAGNOSIS — R197 Diarrhea, unspecified: Secondary | ICD-10-CM

## 2020-05-31 DIAGNOSIS — R194 Change in bowel habit: Secondary | ICD-10-CM | POA: Diagnosis not present

## 2020-05-31 DIAGNOSIS — R109 Unspecified abdominal pain: Secondary | ICD-10-CM

## 2020-05-31 DIAGNOSIS — Z7689 Persons encountering health services in other specified circumstances: Secondary | ICD-10-CM | POA: Diagnosis not present

## 2020-05-31 MED ORDER — METRONIDAZOLE 250 MG PO TABS
250.0000 mg | ORAL_TABLET | Freq: Three times a day (TID) | ORAL | 0 refills | Status: DC
Start: 2020-05-31 — End: 2020-11-12

## 2020-05-31 MED FILL — metroNIDAZOLE 250 MG TABS: 250 | 7 days supply | Qty: 21 | Fill #0

## 2020-05-31 NOTE — Patient Instructions (Signed)
We have sent the following medications to your pharmacy for you to pick up at your convenience:  Flagyl.  Your provider has requested that you go to the basement level for lab work before leaving today. Press "B" on the elevator. The lab is located at the first door on the left as you exit the elevator.

## 2020-05-31 NOTE — Progress Notes (Signed)
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  I saw the patient May 10, 2020.  At that time the issues were symptomatic internal hemorrhoids with occasional prolapse and bleeding as well as chronic constipation.  Recommended therapies included Anusol suppositories, Metamucil, and MiraLAX.  We also discussed in office hemorrhoidal banding procedure and provided him literature on that topic for review.  He contacted the office a few days later complained of diarrhea.  Sounded like acute gastroenteritis.  Seen in the emergency room 2 days ago regarding the same.  Complaining of abdominal discomfort.  Reports to me postprandial cramping with urgency and loose stools.  He now states that this has been going on for 3 to 4 months.  It is worsening.  Blood work from May 29, 2020 shows unremarkable comprehensive metabolic panel.  Hemoglobin 14.8.  White blood cell count 11.8.  CT scan of the abdomen pelvis yesterday revealed no acute abdominal process.  There was diverticulosis without diverticulitis and evidence of prior hip surgery.  Testing for Covid was negative.  Patient was to submit stool studies.  He contacted the office yesterday and this appointment arranged.  Aside from the above, he has concerns about the possibility of intestinal infections as well as possible heavy metal toxicity from his prior hip replacement.  He continues on oxycodone for his chronic pain and Metformin 1000 mg twice daily for his diabetes.  He does not feel Metformin has resulted in his issues with diarrhea.  His last complete colonoscopy May 26, 2017 revealed diverticulosis and internal hemorrhoids.  No neoplasia.  He has completed his Covid vaccination series.  REVIEW OF SYSTEMS:  All non-GI ROS negative unless otherwise stated in the HPI except for arthritis, headaches, anxiety  Past  Medical History:  Diagnosis Date  . Anxiety   . Aortic atherosclerosis ([place])   . BPH (benign prostatic hyperplasia)   . Chronic pain syndrome    neck and low back  . DDD (degenerative disc disease), cervical   . Diverticulosis   . DJD (degenerative joint disease)   . Ganglion cyst    12 mm , right posterior knee  . Hyperlipidemia   . Internal hemorrhoids   . Migraines   . OA (osteoarthritis)    knees, hands, right shoulder  . Positive QuantiFERON-TB Gold test   . Shoulder pain   . Tear of right rotator cuff   . Type 2 diabetes mellitus ([place])    last A1c 5.4 on 04-13-2018 in epic (followed by pcp)  . Wears glasses     Past Surgical History:  Procedure Laterality Date  . ANAL FISSURE REPAIR    . ARTHOSCOPIC ROTAOR CUFF REPAIR Right 06/15/2018   Procedure: RIGHT SHOULDER ARTHROSCOPY, DEBRIDEMENT, SUBACROMIAL DECOMPRESSION, DISTAL CLAVICLE RESECTION, ROTATOR CUFF REPAIR;  Surgeon: [name], MD;  Location: [place];  Service: Orthopedics;  Laterality: Right;  . COLONOSCOPY  05-26-2017   dr Henrene Pastor  . Hip Resurface  2006  . KNEE ARTHROSCOPY W/ MENISCAL REPAIR Bilateral right 1993; left 2010  . MASS EXCISION Right 04/21/2018   Procedure: EXCISION RIGHT THIGH SOFT TISSUE MASS;  Surgeon: [name], MD;  [facility];  Service: Orthopedics;  Laterality: Right;  . SEPTOPLASTY    . SHOULDER ARTHROSCOPY WITH ROTATOR CUFF REPAIR Left   . SLAP Tear Repair  2008  . TONSILLECTOMY    . TOTAL  KNEE ARTHROPLASTY Left 12/08/2016   Procedure: LEFT TOTAL KNEE ARTHROPLASTY;  Surgeon: [name], MD;  [facility];  Service: Orthopedics;  Laterality: Left;  . TOTAL KNEE ARTHROPLASTY WITH REVISION COMPONENTS Left 04/21/2018   Procedure: Left knee polyethylene exchange ;  Surgeon: [name], MD;  [facility];  Service: Orthopedics;  Laterality: Left;    Social History Daniel Bossier, 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 He has never used smokeless  tobacco. He reports current alcohol use. He reports that he does not use drugs.  family history is not on file.  Allergies  Allergen Reactions  . Benzoin Compound Other (See Comments)    Blisters        PHYSICAL EXAMINATION: Vital signs: BP 126/82 (BP Location: Left Arm, Patient Position: Sitting)   Pulse 86   Ht 6' (1.829 m)   Wt 298 lb (135.2 kg)   SpO2 97%   BMI 40.42 kg/m   Constitutional: generally well-appearing, no acute distress Psychiatric: alert and oriented x3, cooperative Eyes: extraocular movements intact, anicteric, conjunctiva pink Mouth: oral pharynx moist, no lesions Neck: supple no lymphadenopathy Cardiovascular: heart regular rate and rhythm, no murmur Lungs: clear to auscultation bilaterally Abdomen: soft, nontender, nondistended, no obvious ascites, no peritoneal signs, normal bowel sounds, no organomegaly Rectal: Omitted Extremities: no clubbing, cyanosis, or lower extremity edema bilaterally Skin: no lesions on visible extremities Neuro: No focal deficits.  Cranial nerves intact  ASSESSMENT:  1.  Diarrhea.  Worsening.  Prior history of chronic constipation.  Now with postprandial urgency and loose stools.  Intermittent bleeding.  Rule out infection.  Rule out colitis macroscopic or microscopic.  Rule out Metformin effect. 2.  Prolapsing internal hemorrhoids noted 3 weeks ago 3.  Prior colonoscopy with diverticulosis and hemorrhoids 4.  Multiple medical problems 5.  Concerns over heavy metal toxicity 6.  Patient requests out of work note until June 02, 2020   PLAN:  1.  Stool studies for GI pathogens 2.  Empiric treatment with metronidazole 250 mg 3 times daily for 7 days 3.  Urine screen for heavy metals 4.  If the above negative, would proceed to colonoscopy with biopsies.  Previously, the nature of the procedure, as well as the risks, benefits, and alternatives were carefully and thoroughly reviewed with the patient. Ample time for discussion  and questions allowed. The patient understood, was satisfied, and agreed to proceed.

## 2020-06-03 LAB — GI PROFILE, STOOL, PCR

## 2020-06-05 MED FILL — AMOX-CLAV 875-125 MG TABLET: 875-125 | 7 days supply | Qty: 14 | Fill #0

## 2020-06-08 ENCOUNTER — Other Ambulatory Visit: Payer: Self-pay

## 2020-06-08 ENCOUNTER — Other Ambulatory Visit: Payer: Self-pay | Admitting: Sleep Medicine

## 2020-06-08 DIAGNOSIS — Z20822 Contact with and (suspected) exposure to covid-19: Secondary | ICD-10-CM | POA: Diagnosis not present

## 2020-06-10 LAB — NOVEL CORONAVIRUS, NAA: SARS-CoV-2, NAA: NOT DETECTED

## 2020-06-10 LAB — SARS-COV-2, NAA 2 DAY TAT

## 2020-06-12 DIAGNOSIS — M545 Low back pain: Secondary | ICD-10-CM | POA: Diagnosis not present

## 2020-06-12 DIAGNOSIS — M542 Cervicalgia: Secondary | ICD-10-CM | POA: Diagnosis not present

## 2020-06-12 DIAGNOSIS — M546 Pain in thoracic spine: Secondary | ICD-10-CM | POA: Diagnosis not present

## 2020-06-12 DIAGNOSIS — M9901 Segmental and somatic dysfunction of cervical region: Secondary | ICD-10-CM | POA: Diagnosis not present

## 2020-06-13 LAB — HEAVY METALS PROFILE II, URINE
Arsenic Ur: NOT DETECTED ug/L (ref 0–9)
Cadmium, Ur: NOT DETECTED ug/L
Creatinine(Crt),U: 1.01 g/L (ref 0.30–3.00)
Lead, Rand Ur: NOT DETECTED ug/L (ref 0–49)
Mercury, Ur: NOT DETECTED ug/L (ref 0–19)

## 2020-06-13 LAB — SPECIMEN STATUS REPORT

## 2020-06-22 ENCOUNTER — Ambulatory Visit: Payer: 59 | Attending: Specialist | Admitting: Physical Therapy

## 2020-06-22 ENCOUNTER — Encounter: Payer: Self-pay | Admitting: Physical Therapy

## 2020-06-22 ENCOUNTER — Other Ambulatory Visit: Payer: Self-pay

## 2020-06-22 DIAGNOSIS — M545 Low back pain: Secondary | ICD-10-CM | POA: Insufficient documentation

## 2020-06-22 DIAGNOSIS — M6281 Muscle weakness (generalized): Secondary | ICD-10-CM

## 2020-06-22 DIAGNOSIS — M542 Cervicalgia: Secondary | ICD-10-CM

## 2020-06-22 DIAGNOSIS — R293 Abnormal posture: Secondary | ICD-10-CM | POA: Diagnosis not present

## 2020-06-22 DIAGNOSIS — M25511 Pain in right shoulder: Secondary | ICD-10-CM | POA: Insufficient documentation

## 2020-06-22 DIAGNOSIS — G8929 Other chronic pain: Secondary | ICD-10-CM | POA: Diagnosis not present

## 2020-06-22 NOTE — Therapy (Signed)
[place], [place], [address] Phone: [phone]   Fax:  [phone]  Physical Therapy Treatment/Re-Eval  Patient Details  Name: Daniel WILMES, MD MRN: [id number] Date of Birth: 1961-06-26 Referring Provider (PT): Daniel Cabal, MD   Encounter Date: 06/22/2020   PT End of Session - 06/22/20 0941    Visit Number 12    Number of Visits 22    Date for PT Re-Evaluation 08/11/20    Authorization Type UMR/Tric[employer]    PT Start Time 0801    PT Stop Time 0843    PT Time Calculation (min) 42 min    Activity Tolerance Patient tolerated treatment well    Behavior During Therapy [facility] for tasks assessed/performed           Past Medical History:  Diagnosis Date  . Anxiety   . Aortic atherosclerosis ([place])   . BPH (benign prostatic hyperplasia)   . Chronic pain syndrome    neck and low back  . DDD (degenerative disc disease), cervical   . Diverticulosis   . DJD (degenerative joint disease)   . Ganglion cyst    12 mm , right posterior knee  . Hyperlipidemia   . Internal hemorrhoids   . Migraines   . OA (osteoarthritis)    knees, hands, right shoulder  . Positive QuantiFERON-TB Gold test   . Shoulder pain   . Tear of right rotator cuff   . Type 2 diabetes mellitus ([place])    last A1c 5.4 on 04-13-2018 in epic (followed by pcp)  . Wears glasses     Past Surgical History:  Procedure Laterality Date  . ANAL FISSURE REPAIR    . ARTHOSCOPIC ROTAOR CUFF REPAIR Right 06/15/2018   Procedure: RIGHT SHOULDER ARTHROSCOPY, DEBRIDEMENT, SUBACROMIAL DECOMPRESSION, DISTAL CLAVICLE RESECTION, ROTATOR CUFF REPAIR;  Surgeon: Daniel Cabal, MD;  Location: [place];  Service: Orthopedics;  Laterality: Right;  . COLONOSCOPY  05-26-2017   dr Henrene Pastor  . Hip Resurface  2006  . KNEE ARTHROSCOPY W/ MENISCAL REPAIR Bilateral right 1993; left 2010  . MASS EXCISION Right 04/21/2018   Procedure: EXCISION RIGHT THIGH  SOFT TISSUE MASS;  Surgeon: Daniel Arabian, MD;  Location: [facility];  Service: Orthopedics;  Laterality: Right;  . SEPTOPLASTY    . SHOULDER ARTHROSCOPY WITH ROTATOR CUFF REPAIR Left   . SLAP Tear Repair  2008  . TONSILLECTOMY    . TOTAL KNEE ARTHROPLASTY Left 12/08/2016   Procedure: LEFT TOTAL KNEE ARTHROPLASTY;  Surgeon: Daniel Arabian, MD;  Location[facility];  Service: Orthopedics;  Laterality: Left;  . TOTAL KNEE ARTHROPLASTY WITH REVISION COMPONENTS Left 04/21/2018   Procedure: Left knee polyethylene exchange ;  Surgeon: Daniel Arabian, MD;  Location[facility];  Service: Orthopedics;  Laterality: Left;    There were no vitals filed for this visit.   Subjective Assessment - 06/22/20 0803    Subjective I don't feel like I maintain gains without needling bc things lock back up. Turning head to Rt creates pinching. Low back is killing me.    Currently in Pain? Yes    Pain Score 7     Pain Location Neck    Pain Orientation Right    Pain Descriptors / Indicators Tightness    Aggravating Factors  looking to the Rt, cervical extension, stiff to Lt    Pain Relieving Factors DN    Pain Score 7    Pain Location Back    Pain Orientation Lower;Right  Pain Descriptors / Indicators Sore    Pain Radiating Towards Lt plantar fascia    Aggravating Factors  being upright    Pain Relieving Factors heel lift in Rt shoe.              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 Lt post illium      AROM   Right Shoulder Flexion 154 Degrees    Right Shoulder ABduction 138 Degrees    Cervical Flexion 40    Cervical Extension 24    Cervical - Right Side Bend 26    Cervical - Left Side Bend 30    Cervical - Right Rotation 46    Cervical - Left Rotation 36                         [facility] Adult PT Treatment/Exercise - 06/22/20 0001      Exercises   Exercises Other Exercises    Other Exercises  self correction for Lt post illium 2 min hold      Neck Exercises: Supine   Capital Flexion Limitations retraction +flexion for DNF strengthening      Manual Therapy   Manual therapy comments skilled palpation and monitoring during TPDN    Joint Mobilization Rt upper sacral quadrant PA spring, Rt first rib depression, upper thoracic PA, L2-5 Lt PA    Soft tissue mobilization all muscles listed under DN            Trigger Point Dry Needling - 06/22/20 0001    Other Dry Needling Rt paraspinals C3-T4    Upper Trapezius Response Twitch reponse elicited;Palpable increased muscle length   Rt   Suboccipitals Response Twitch response elicited;Palpable increased muscle length   Rt   Gluteus Maximus Response Twitch response elicited;Palpable increased muscle length   Rt               PT Education - 06/22/20 0948    Education Details anatomy of condition, POC, HEP, goals & progress/changes, exercise form/rationale    Person(s) Educated Patient    Methods Explanation;Verbal cues    Comprehension Verbalized understanding;Need further instruction            PT Short Term Goals - 03/19/20 1717      PT SHORT TERM GOAL #1   Title Pt will be  independent in postural correction    Status Achieved      PT SHORT TERM GOAL #  2   Title pt will perform stretches at least 3 times daily  to be proactive about pain onset    Status Achieved             PT Long Term Goals - 06/22/20 0808      PT LONG TERM GOAL #1   Title Equal cervical rotation Rt to Lt without discomfort    Baseline see flowsheet, previously achieved but removed status today    Status On-going    Target Date 08/11/20      PT LONG TERM GOAL #2   Title gross GHJ strength 5/5    Status Achieved      PT LONG TERM GOAL #3   Title pt will be able to complete a work week without onset of HA or N/T    Baseline daily HA by the end of the day since it has been so long since PT    Status On-going    Target Date 08/11/20      PT LONG TERM GOAL #4   Title pt will be able to carry and lift objects required for ADLs    Baseline I am still doing it but it hurts- due to weight    Status On-going    Target Date 08/11/20      PT LONG TERM GOAL #5   Title Pt will be independent in long term HEP/gym program for continued strengthening    Status On-going    Target Date 08/11/20                 Plan - 06/22/20 9211    Clinical Impression Statement Pt presents to PT after a 1 month gap due to scheudling as aligning clinic schedule with his work schedule is very difficult. Overall his shoulder has done very well with carryover and he continues to present with good control of UE motion. However, his cervical region is causing limitations bu increasing pain and affecting UE motion/strength. He was doing very well last time I saw him but working very long hours and finding only minimal time to do stretches and exercises keeps increasing muscular tension. He had achieved his HA goal but is back to experiencing daily HA. Was able to demo more comfortable cervical ROM following DN/manual therapy today. Did address LBP again today as it is contributing to postural tension along  biomechanical chain. Notable post rotation of Lt illium addressed with stretch to do at home. He is wearing a lift in his Rt shoe which is helpful but not enough to fully treat the issue. I did ask for him to obtain a referral for LBP so we can treat the entire biomechanical chain. Will continue to treat and work toward re-achieving his long term functional goals while scheduling around work schedule.    Examination-Activity Limitations Bathing;Reach Overhead;Caring for Others;Sleep;Carry;Lift;Bend;Stand    Examination-Participation Restrictions Occupation    Stability/Clinical Decision Making Evolving/Moderate complexity    PT Frequency 2x / week    PT Duration --   every other week through the end of Oct, 21   PT Treatment/Interventions ADLs/Self Care Home Management;Cryotherapy;Electrical Stimulation;Ultrasound;Traction;Moist Heat;Iontophoresis 4mg /ml Dexamethasone;Therapeutic activities;Therapeutic exercise;Neuromuscular re-education;Manual techniques;Patient/family education;Passive range of motion;Dry needling;Taping;Joint Manipulations;Spinal Manipulations    PT Next Visit Plan cervical focus, prone periscap strength    PT Home Exercise Plan [id number], self correction for Lt post illium    Consulted and Agree with Plan of Care Patient           Patient will benefit  from skilled therapeutic intervention in order to improve the following deficits and impairments:  Decreased range of motion, Impaired UE functional use, Increased muscle spasms, Decreased activity tolerance, Pain, Impaired flexibility, Improper body mechanics, Decreased strength, Impaired sensation, Postural dysfunction  Visit Diagnosis: Cervicalgia - Plan: PT plan of care cert/re-cert  Chronic right shoulder pain - Plan: PT plan of care cert/re-cert  Muscle weakness (generalized) - Plan: PT plan of care cert/re-cert  Abnormal posture - Plan: PT plan of care cert/re-cert     Problem List Patient Active Problem List    Diagnosis Date Noted  . Right shoulder injury 06/15/2018  . S/P arthroscopy of right shoulder 06/15/2018  . Failed total knee arthroplasty ([place]) 04/21/2018  . OA (osteoarthritis) of knee 12/08/2016  . 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 Cervical facet joint syndrome 12/17/2015  . Chronic bilateral low back pain without sciatica 12/17/2015  . Primary osteoarthritis of left knee 12/17/2015    Lilyth Lawyer C. Ananda Caya PT, DPT 06/22/20 9:54 AM   [place], [place], [address] Phone: [phone]   Fax:  [phone]  Name: ARMANII PRESSNELL, MD MRN: [id number] Date of Birth: 08/29/1961

## 2020-06-27 MED FILL — TAMSULOSIN HCL 0.4 MG CAP: 0.4 | 90 days supply | Qty: 180 | Fill #1

## 2020-06-28 ENCOUNTER — Other Ambulatory Visit (HOSPITAL_COMMUNITY): Payer: Self-pay | Admitting: Internal Medicine

## 2020-06-28 MED FILL — PANTOPRAZOLE SOD DR 40 MG T: 40 | 90 days supply | Qty: 90 | Fill #3

## 2020-06-28 MED FILL — KETOCONAZOLE 2% CREAM: 2 | 20 days supply | Qty: 30 | Fill #2

## 2020-06-28 MED FILL — METHOCARBAMOL 500 MG TABS: 500 | 90 days supply | Qty: 270 | Fill #0

## 2020-06-28 MED FILL — TADALAFIL 5 MG TABS: 5 | 90 days supply | Qty: 90 | Fill #0

## 2020-06-28 MED FILL — GABAPENTIN 300 MG CAPSULE: 300 | 90 days supply | Qty: 270 | Fill #1

## 2020-06-28 MED FILL — DICYCLOMINE 20 MG TABLET: 20 | 10 days supply | Qty: 60 | Fill #1

## 2020-06-28 MED FILL — HYDROCORTISONE ACETATE 25 M: 25 | 24 days supply | Qty: 24 | Fill #1

## 2020-06-28 MED FILL — PRAVASTATIN NA 40 MG TAB: 40 | 90 days supply | Qty: 90 | Fill #0

## 2020-06-28 MED FILL — RIZATRIPTAN 5 MG ODT: 5 | 30 days supply | Qty: 15 | Fill #2

## 2020-06-28 MED FILL — BACLOFEN 10 MG TABS: 10 | 90 days supply | Qty: 360 | Fill #0

## 2020-06-28 MED FILL — CELECOXIB 200 MG CAP: 200 | 90 days supply | Qty: 90 | Fill #0

## 2020-06-28 MED FILL — LIDOCAINE PATCH 5%: 5 | 30 days supply | Qty: 60 | Fill #3

## 2020-06-28 MED FILL — BUTALB-ACETAMIN-CAFF 50-325: 50-325-40 | 30 days supply | Qty: 180 | Fill #0

## 2020-06-29 MED FILL — ZOLPIDEM TARTRATE 10 MG TAB: 10 | 90 days supply | Qty: 90 | Fill #0

## 2020-06-29 MED FILL — traMADol HCL 50 MG TABS: 50 | 30 days supply | Qty: 240 | Fill #0

## 2020-06-29 MED FILL — diazePAM 5 MG TABS: 5 | 90 days supply | Qty: 180 | Fill #0

## 2020-06-29 MED FILL — oxyCODONE HCL 5 MG TABS: 5 | 30 days supply | Qty: 180 | Fill #0

## 2020-07-05 ENCOUNTER — Other Ambulatory Visit: Payer: Self-pay

## 2020-07-05 ENCOUNTER — Encounter: Payer: Self-pay | Admitting: Physical Therapy

## 2020-07-05 ENCOUNTER — Ambulatory Visit: Payer: 59 | Admitting: Physical Therapy

## 2020-07-05 DIAGNOSIS — R293 Abnormal posture: Secondary | ICD-10-CM

## 2020-07-05 DIAGNOSIS — M545 Low back pain, unspecified: Secondary | ICD-10-CM

## 2020-07-05 DIAGNOSIS — M25511 Pain in right shoulder: Secondary | ICD-10-CM | POA: Diagnosis not present

## 2020-07-05 DIAGNOSIS — G8929 Other chronic pain: Secondary | ICD-10-CM | POA: Diagnosis not present

## 2020-07-05 DIAGNOSIS — M6281 Muscle weakness (generalized): Secondary | ICD-10-CM

## 2020-07-05 DIAGNOSIS — M542 Cervicalgia: Secondary | ICD-10-CM

## 2020-07-05 NOTE — Therapy (Signed)
[place], [place], [address] Phone: [phone]   Fax:  [phone]  Physical Therapy Treatment/Recertification  Patient Details  Name: Daniel STEINHART, MD MRN: [id number] Date of Birth: 24-Feb-1961 Referring Provider (PT): [name], MD, [name], MD   Encounter Date: 07/05/2020   PT End of Session - 07/05/20 1410    Visit Number 13    Number of Visits 22    Date for PT Re-Evaluation 08/11/20    Authorization Type UMR/Tricare    PT Start Time 1410    PT Stop Time 1457    PT Time Calculation (min) 47 min    Activity Tolerance Patient tolerated treatment well    Behavior During Therapy [facility] for tasks assessed/performed           Past Medical History:  Diagnosis Date  . Anxiety   . Aortic atherosclerosis ([place])   . BPH (benign prostatic hyperplasia)   . Chronic pain syndrome    neck and low back  . DDD (degenerative disc disease), cervical   . Diverticulosis   . DJD (degenerative joint disease)   . Ganglion cyst    12 mm , right posterior knee  . Hyperlipidemia   . Internal hemorrhoids   . Migraines   . OA (osteoarthritis)    knees, hands, right shoulder  . Positive QuantiFERON-TB Gold test   . Shoulder pain   . Tear of right rotator cuff   . Type 2 diabetes mellitus ([place])    last A1c 5.4 on 04-13-2018 in epic (followed by pcp)  . Wears glasses     Past Surgical History:  Procedure Laterality Date  . ANAL FISSURE REPAIR    . ARTHOSCOPIC ROTAOR CUFF REPAIR Right 06/15/2018   Procedure: RIGHT SHOULDER ARTHROSCOPY, DEBRIDEMENT, SUBACROMIAL DECOMPRESSION, DISTAL CLAVICLE RESECTION, ROTATOR CUFF REPAIR;  Surgeon: [name], MD;  Location: [place];  Service: Orthopedics;  Laterality: Right;  . COLONOSCOPY  05-26-2017   dr Henrene Pastor  . Hip Resurface  2006  . KNEE ARTHROSCOPY W/ MENISCAL REPAIR Bilateral right 1993; left 2010  . MASS EXCISION Right 04/21/2018    Procedure: EXCISION RIGHT THIGH SOFT TISSUE MASS;  Surgeon: [name], MD;  [facility];  Service: Orthopedics;  Laterality: Right;  . SEPTOPLASTY    . SHOULDER ARTHROSCOPY WITH ROTATOR CUFF REPAIR Left   . SLAP Tear Repair  2008  . TONSILLECTOMY    . 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 TOTAL KNEE ARTHROPLASTY WITH REVISION COMPONENTS Left 04/21/2018   Procedure: Left knee polyethylene exchange ;  Surgeon: [name], MD;  Location[facility];  Service: Orthopedics;  Laterality: Left;    There were no vitals filed for this visit.   Subjective Assessment - 07/05/20 1412    Subjective It has been a long week and wearing a lot of PPE. both shoulders are actually stiff. 3 layer lift cont in Rt shoe. Lt knee doesn't hurt hardly at all unless putting pressure from a 90 deg angle. Significantly less swelling in Lt knee with lift. Seeing [facility] for Lt foot in a few weeks.    Currently in Pain? Yes    Pain Score 5     Pain Location Neck    Pain Orientation Right;Left    Pain Descriptors / Indicators Tightness;Sore    Aggravating Factors  end  range SB    Pain Relieving Factors DN, stretches/exercises    Pain Score 5    Pain Location Back    Pain Orientation Lower;Right    Pain Descriptors / Indicators Aching;Sore    Aggravating Factors  long week at work, stiffness in AM    Pain Relieving Factors heel lift, correcting posture              [facility] PT Assessment - 07/05/20 0001      Assessment   Medical Diagnosis Rt shoulder pain, LBP    Referring Provider (PT) [name], MD, [name], MD    Onset Date/Surgical Date 06/15/18   lumbar is chronic     Precautions   Precautions None      Restrictions   Weight Bearing Restrictions No      Balance Screen   Has the patient fallen in the past 6 months No      [place] residence    Living Arrangements Spouse/significant other      Prior Function   Level of Independence Independent    Vocation Full time employment    Vocation Requirements MD at [employer]      Sensation   Additional Comments HA to 1-2/week      Posture/Postural Control   Posture Comments rt shoulder rounded forward at rest, incr cervical lordosis, flat thoracic spine. Lt post illium      Strength   Overall Strength Comments significant compensation in Rt hip ext, Rt LBP with Lt hip ext      Palpation   Palpation comment Lt post illium, limited mobility in Rt SIJ                         [facility] Adult PT Treatment/Exercise - 07/05/20 0001      Exercises   Exercises Lumbar      Neck Exercises: Prone   Other Prone Exercise chin tuck with retraction off of hands      Lumbar Exercises: Stretches   Other Lumbar Stretch Exercise self correction for Lt post illium      Lumbar Exercises: Supine   AB Set Limitations heavy cuing for pelvic tilt, short and long duration holds    Bent Knee Raise Limitations ab set with marching    Bridge with [employer] 15 reps    Bridge with [employer] Limitations cues for pelvic tilt      Manual Therapy   Manual therapy comments skilled palpation and monitoring during TPDN    Joint Mobilization Rt SIJ PA    Soft tissue mobilization all muscles listed under DN            Trigger Point Dry Needling - 07/05/20 0001    Upper Trapezius Response Twitch reponse elicited;Palpable increased muscle length   bil   Gluteus Maximus Response Twitch response elicited;Palpable increased muscle length   bil- just lateral to SIJ               PT Education - 07/05/20 1509    Education Details exercise form/rationale, anatomy of condition, body mechanics    Person(s) Educated Patient    Methods Explanation;Demonstration;Tactile cues;Verbal cues;Handout    Comprehension Verbalized understanding;Returned  demonstration;Verbal cues required;Tactile cues required;Need further instruction            PT Short Term Goals - 03/19/20 1717      PT SHORT TERM GOAL #1   Title Pt  will be independent in postural correction    Status Achieved      PT SHORT TERM GOAL #2   Title pt will perform stretches at least 3 times daily  to be proactive about pain onset    Status Achieved             PT Long Term Goals - 07/05/20 1505      PT LONG TERM GOAL #1   Title Equal cervical rotation Rt to Lt without discomfort    Baseline see flowsheet, previously achieved but removed status today    Status On-going      PT LONG TERM GOAL #2   Title gross GHJ strength 5/5    Status Achieved      PT LONG TERM GOAL #3   Title pt will be able to complete a work week without onset of HA or N/T    Baseline reports 1-2 per week today    Status On-going      PT LONG TERM GOAL #4   Title pt will be able to carry and lift objects required for ADLs    Baseline I am still doing it but it hurts- due to weight    Status On-going      PT LONG TERM GOAL #5   Title Pt will be independent in long term HEP/gym program for continued strengthening    Baseline requires further progression    Status On-going      Additional Long Term Goals   Additional Long Term Goals Yes      PT LONG TERM GOAL #6   Title pt will demo hip extension without compensation and 5/5 bil    Baseline see flowsheet for description    Status New    Target Date 08/11/20      PT LONG TERM GOAL #7   Title pt will demo proper core activation in squating, bending and lifting activiites for support to lumbar spin    Baseline requires further training    Status New    Target Date 08/11/20                 Plan - 07/05/20 1500    Clinical Impression Statement LBP added as diagnosis today for continued treatment along spinal biomechanical chain. Noted that there is very minimal lumbar movement in standing AROM with hinge point at T12-L1  to compensate. Significant difficulty to isolate abdominal wall to perform post pelvic tilt due to lack of mobility. We discussed using the patient beds to improve body mechanics with patient mobility but would likely benefit from further training in this. Lift is still helpful to keep knee from increasing pain significantly so he will continue to use this. Plan to progress core engagement for support to lumbar spine.    Personal Factors and Comorbidities Comorbidity 3+;Profession;Fitness;Time since onset of injury/illness/exacerbation    Comorbidities OA, h/o RCR, chronic pain, DDD, DJD    Examination-Activity Limitations Bathing;Reach Overhead;Caring for Others;Sleep;Carry;Lift;Bend;Stand;Squat;Stairs;Other;Sit    Examination-Participation Restrictions Occupation    PT Duration --   every other week through the end of Oct, 2021- around work schedule   PT Treatment/Interventions ADLs/Self Care Home Management;Cryotherapy;Electrical Stimulation;Ultrasound;Traction;Moist Heat;Iontophoresis 4mg /ml Dexamethasone;Therapeutic activities;Therapeutic exercise;Neuromuscular re-education;Manual techniques;Patient/family education;Passive range of motion;Dry needling;Taping;Joint Manipulations;Spinal Manipulations;Functional mobility training    PT Next Visit Plan cont core activation & lumbar mobility    PT Home Exercise Plan [id number], self correction for Lt post illium    Consulted and Agree with Plan of Care Patient  Patient will benefit from skilled therapeutic intervention in order to improve the following deficits and impairments:  Decreased range of motion, Impaired UE functional use, Increased muscle spasms, Decreased activity tolerance, Pain, Impaired flexibility, Improper body mechanics, Decreased strength, Impaired sensation, Postural dysfunction, Decreased mobility  Visit Diagnosis: Cervicalgia - Plan: PT plan of care cert/re-cert  Chronic right shoulder pain - Plan: PT plan of care  cert/re-cert  Abnormal posture - Plan: PT plan of care cert/re-cert  Muscle weakness (generalized) - Plan: PT plan of care cert/re-cert  Chronic right-sided low back pain without sciatica - Plan: PT plan of care cert/re-cert     Problem List Patient Active Problem List   Diagnosis Date Noted  . Right shoulder injury 06/15/2018  . S/P arthroscopy of right shoulder 06/15/2018  . Failed total knee arthroplasty ([place]) 04/21/2018  . OA (osteoarthritis) of knee 12/08/2016  . 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 Cervical facet joint syndrome 12/17/2015  . Chronic bilateral low back pain without sciatica 12/17/2015  . Primary osteoarthritis of left knee 12/17/2015    Patric Vanpelt C. Shaunie Boehm PT, DPT 07/05/20 3:12 PM   [facility] [address] [place], [place], [address] Phone: [phone]   Fax:  [phone]  Name: Daniel SCHILL, MD MRN: [id number] Date of Birth: 07-26-1961

## 2020-07-19 ENCOUNTER — Ambulatory Visit: Payer: 59 | Attending: Internal Medicine

## 2020-07-19 DIAGNOSIS — Z23 Encounter for immunization: Secondary | ICD-10-CM

## 2020-07-19 NOTE — Progress Notes (Signed)
   Covid-19 Vaccination Clinic  Name:  Daniel CHITTUM, MD    MRN: [id number] DOB: 28-May-1961  07/19/2020  [name] was observed post Covid-19 immunization for 15 minutes without incident. He was provided with Vaccine Information Sheet and instruction to access the V-Safe system.   [name] was instructed to call 911 with any severe reactions post vaccine: [name] Difficulty breathing  . Swelling of face and throat  . A fast heartbeat  . A bad rash all over body  . Dizziness and weakness

## 2020-07-20 ENCOUNTER — Encounter: Payer: Self-pay | Admitting: Physical Therapy

## 2020-07-20 ENCOUNTER — Ambulatory Visit: Payer: 59 | Attending: Specialist | Admitting: Physical Therapy

## 2020-07-20 ENCOUNTER — Other Ambulatory Visit: Payer: Self-pay

## 2020-07-20 DIAGNOSIS — M25511 Pain in right shoulder: Secondary | ICD-10-CM | POA: Insufficient documentation

## 2020-07-20 DIAGNOSIS — G8929 Other chronic pain: Secondary | ICD-10-CM | POA: Diagnosis not present

## 2020-07-20 DIAGNOSIS — M6281 Muscle weakness (generalized): Secondary | ICD-10-CM | POA: Insufficient documentation

## 2020-07-20 DIAGNOSIS — R293 Abnormal posture: Secondary | ICD-10-CM | POA: Diagnosis not present

## 2020-07-20 DIAGNOSIS — M542 Cervicalgia: Secondary | ICD-10-CM | POA: Insufficient documentation

## 2020-07-20 DIAGNOSIS — M545 Low back pain, unspecified: Secondary | ICD-10-CM | POA: Diagnosis not present

## 2020-07-20 NOTE — Therapy (Signed)
[place], [place], [address] Phone: [phone]   Fax:  [phone]  Physical Therapy Treatment  Patient Details  Name: [name], [name] MRN: [id number] Date of Birth: 07-05-1961 Referring Provider (PT): [name], [name], [name], [name]   Encounter Date: 07/20/2020   PT End of Session - 07/20/20 1104    Visit Number 14    Number of Visits 22    Date for PT Re-Evaluation 08/11/20    Authorization Type UMR/Tric[employer]    PT Start Time 1102    PT Stop Time 1126    PT Time Calculation (min) 24 min    Activity Tolerance Patient tolerated treatment well    Behavior During Therapy [name] for tasks assessed/performed           Past Medical History:  Diagnosis Date   Anxiety    Aortic atherosclerosis ([place])    BPH (benign prostatic hyperplasia)    Chronic pain syndrome    neck and low back   DDD (degenerative disc disease), cervical    Diverticulosis    DJD (degenerative joint disease)    Ganglion cyst    12 mm , right posterior knee   Hyperlipidemia    Internal hemorrhoids    Migraines    OA (osteoarthritis)    knees, hands, right shoulder   Positive QuantiFERON-TB Gold test    Shoulder pain    Tear of right rotator cuff    Type 2 diabetes mellitus ([place])    last A1c 5.4 on 04-13-2018 in epic (followed by pcp)   Wears glasses     Past Surgical History:  Procedure Laterality Date   ANAL FISSURE REPAIR     ARTHOSCOPIC ROTAOR CUFF REPAIR Right 06/15/2018   Procedure: RIGHT SHOULDER ARTHROSCOPY, DEBRIDEMENT, SUBACROMIAL DECOMPRESSION, DISTAL CLAVICLE RESECTION, ROTATOR CUFF REPAIR;  Surgeon: [name], [name];  Location: [place];  Service: Orthopedics;  Laterality: Right;   COLONOSCOPY  05-26-2017   dr Henrene Pastor   Hip Resurface  2006   KNEE ARTHROSCOPY W/ MENISCAL REPAIR Bilateral right 1993; left 2010   MASS EXCISION Right 04/21/2018   Procedure: EXCISION  RIGHT THIGH SOFT TISSUE MASS;  Surgeon: [name], [name];  [facility];  Service: Orthopedics;  Laterality: Right;   SEPTOPLASTY     SHOULDER ARTHROSCOPY WITH ROTATOR CUFF REPAIR Left    SLAP Tear Repair  2008   TONSILLECTOMY     TOTAL KNEE ARTHROPLASTY Left 12/08/2016   Procedure: LEFT TOTAL KNEE ARTHROPLASTY;  Surgeon: [name], [name];  [facility];  Service: Orthopedics;  Laterality: Left;   TOTAL KNEE ARTHROPLASTY WITH REVISION COMPONENTS Left 04/21/2018   Procedure: Left knee polyethylene exchange ;  Surgeon: [name], [name];  [facility];  Service: Orthopedics;  Laterality: Left;    There were no vitals filed for this visit.   Subjective Assessment - 07/20/20 1104    Subjective Lower back is really sore 7-8/10. Shoulder is about a 6. Got my booster so I have a HA today.                             [facility] Adult PT Treatment/Exercise - 07/20/20 0001      Manual Therapy   Manual therapy comments skilled palpation and monitoring during TPDN    Joint Mobilization Rt SIJ PA spring hold 2 min, thoracic & rib mobs in prone, Lt first rib depression  Soft tissue mobilization all muscles listed under DN            Trigger Point Dry Needling - 07/20/20 0001    Other Dry Needling Rt paraspinals L4, 5    Upper Trapezius Response Twitch reponse elicited;Palpable increased muscle length   bil   Gluteus Maximus Response Twitch response elicited;Palpable increased muscle length   Righ                 PT Short Term Goals - 03/19/20 1717      PT SHORT TERM GOAL #1   Title Pt will be independent in postural correction    Status Achieved      PT SHORT TERM GOAL #2   Title pt will perform stretches at least 3 times daily  to be proactive about pain onset    Status Achieved             PT Long Term Goals - 07/05/20 1505      PT LONG TERM GOAL #1   Title Equal cervical rotation Rt to Lt without discomfort    Baseline see  flowsheet, previously achieved but removed status today    Status On-going      PT LONG TERM GOAL #2   Title gross GHJ strength 5/5    Status Achieved      PT LONG TERM GOAL #3   Title pt will be able to complete a work week without onset of HA or N/T    Baseline reports 1-2 per week today    Status On-going      PT LONG TERM GOAL #4   Title pt will be able to carry and lift objects required for ADLs    Baseline I am still doing it but it hurts- due to weight    Status On-going      PT LONG TERM GOAL #5   Title Pt will be independent in long term HEP/gym program for continued strengthening    Baseline requires further progression    Status On-going      Additional Long Term Goals   Additional Long Term Goals Yes      PT LONG TERM GOAL #6   Title pt will demo hip extension without compensation and 5/5 bil    Baseline see flowsheet for description    Status New    Target Date 08/11/20      PT LONG TERM GOAL #7   Title pt will demo proper core activation in squating, bending and lifting activiites for support to lumbar spin    Baseline requires further training    Status New    Target Date 08/11/20                 Plan - 07/20/20 1138    Clinical Impression Statement Ended session following manual treatment due to not feeling well after having vaccine booster yesterday. Noted to demo improved spring mobility of SIJ and reports feeling more mobile after manual therapy today. Lt first rib was elevated today likely affecting Rt side as well.    PT Treatment/Interventions ADLs/Self Care Home Management;Cryotherapy;Electrical Stimulation;Ultrasound;Traction;Moist Heat;Iontophoresis 4mg /ml Dexamethasone;Therapeutic activities;Therapeutic exercise;Neuromuscular re-education;Manual techniques;Patient/family education;Passive range of motion;Dry needling;Taping;Joint Manipulations;Spinal Manipulations;Functional mobility training    PT Next Visit Plan cont core activation &  lumbar mobility    PT Home Exercise Plan [id number], self correction for Lt post illium    Consulted and Agree with Plan of Care Patient  Patient will benefit from skilled therapeutic intervention in order to improve the following deficits and impairments:  Decreased range of motion, Impaired UE functional use, Increased muscle spasms, Decreased activity tolerance, Pain, Impaired flexibility, Improper body mechanics, Decreased strength, Impaired sensation, Postural dysfunction, Decreased mobility  Visit Diagnosis: Chronic right shoulder pain  Cervicalgia  Abnormal posture  Muscle weakness (generalized)  Chronic right-sided low back pain without sciatica     Problem List Patient Active Problem List   Diagnosis Date Noted   Right shoulder injury 06/15/2018   S/P arthroscopy of right shoulder 06/15/2018   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 Taneika Choi PT, DPT 07/20/20 11:44 AM   [facility] [address] [place], [place], [address] Phone: [phone]   Fax:  [phone]  Name: Daniel GANTT, [name] MRN: [id number] Date of Birth: Jul 02, 1961

## 2020-07-23 DIAGNOSIS — M722 Plantar fascial fibromatosis: Secondary | ICD-10-CM | POA: Diagnosis not present

## 2020-08-03 ENCOUNTER — Encounter: Payer: Self-pay | Admitting: Physical Therapy

## 2020-08-03 ENCOUNTER — Other Ambulatory Visit: Payer: Self-pay

## 2020-08-03 ENCOUNTER — Ambulatory Visit: Payer: 59 | Admitting: Physical Therapy

## 2020-08-03 DIAGNOSIS — M25511 Pain in right shoulder: Secondary | ICD-10-CM | POA: Diagnosis not present

## 2020-08-03 DIAGNOSIS — M542 Cervicalgia: Secondary | ICD-10-CM | POA: Diagnosis not present

## 2020-08-03 DIAGNOSIS — M6281 Muscle weakness (generalized): Secondary | ICD-10-CM | POA: Diagnosis not present

## 2020-08-03 DIAGNOSIS — R293 Abnormal posture: Secondary | ICD-10-CM

## 2020-08-03 DIAGNOSIS — M545 Low back pain, unspecified: Secondary | ICD-10-CM | POA: Diagnosis not present

## 2020-08-03 DIAGNOSIS — G8929 Other chronic pain: Secondary | ICD-10-CM

## 2020-08-03 NOTE — Therapy (Signed)
[place], [place], [address] Phone: [phone]   Fax:  [phone]  Physical Therapy Treatment  Patient Details  Name: Daniel MATTHEWS, MD MRN: [id number] Date of Birth: 1961-07-27 Referring Provider (PT): [name], MD, [name], MD   Encounter Date: 08/03/2020   PT End of Session - 08/03/20 1309    Visit Number 15    Number of Visits 22    Date for PT Re-Evaluation 08/11/20    Authorization Type UMR/[employer]ric[employer]    PT Start Time 1145    PT Stop Time 1227    PT Time Calculation (min) 42 min    Activity Tolerance Patient tolerated treatment well    Behavior During Therapy [facility] for tasks assessed/performed           Past Medical History:  Diagnosis Date  . Anxiety   . Aortic atherosclerosis ([place])   . BPH (benign prostatic hyperplasia)   . Chronic pain syndrome    neck and low back  . DDD (degenerative disc disease), cervical   . Diverticulosis   . DJD (degenerative joint disease)   . Ganglion cyst    12 mm , right posterior knee  . Hyperlipidemia   . Internal hemorrhoids   . Migraines   . OA (osteoarthritis)    knees, hands, right shoulder  . Positive QuantiFERON-TB Gold test   . Shoulder pain   . Tear of right rotator cuff   . Type 2 diabetes mellitus ([place])    last A1c 5.4 on 04-13-2018 in epic (followed by pcp)  . Wears glasses     Past Surgical History:  Procedure Laterality Date  . ANAL FISSURE REPAIR    . ARTHOSCOPIC ROTAOR CUFF REPAIR Right 06/15/2018   Procedure: RIGHT SHOULDER ARTHROSCOPY, DEBRIDEMENT, SUBACROMIAL DECOMPRESSION, DISTAL CLAVICLE RESECTION, ROTATOR CUFF REPAIR;  Surgeon: [name], MD;  Location: [place] [facility];  Service: Orthopedics;  Laterality: Right;  . COLONOSCOPY  05-26-2017   dr Henrene Pastor  . Hip Resurface  2006  . KNEE ARTHROSCOPY W/ MENISCAL REPAIR Bilateral right 1993; left 2010  . MASS EXCISION Right 04/21/2018   Procedure: EXCISION  RIGHT THIGH SOFT TISSUE MASS;  Surgeon: [name], MD;  [facility];  Service: Orthopedics;  Laterality: Right;  . SEPTOPLASTY    . SHOULDER ARTHROSCOPY WITH ROTATOR CUFF REPAIR Left   . SLAP Tear Repair  2008  . TONSILLECTOMY    . 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 TOTAL KNEE ARTHROPLASTY WITH REVISION COMPONENTS Left 04/21/2018   Procedure: Left knee polyethylene exchange ;  Surgeon: [name], MD;  [facility];  Service: Orthopedics;  Laterality: Left;    There were no vitals filed for this visit.   Subjective Assessment - 08/03/20 1144    Subjective Had a shot in my foot about 10 days ago and have worked a week since then. My back still hurts- the stretches help a lot.    Currently in Pain? Yes    Pain Score 4     Pain Location Neck    Pain Orientation Right    Pain Descriptors / Indicators Tightness    Pain Score 7    Pain Location Back    Pain Orientation Lower;Right    Aggravating Factors  bending but is improved, worse in AM    Pain Relieving Factors  psotures, stretches                             [facility] Adult PT Treatment/Exercise - 08/03/20 0001      Lumbar Exercises: Supine   Dead Bug Limitations steps progressed to dead bug ext    Other Supine Lumbar Exercises TT hold with GHJ full range flexion    Other Supine Lumbar Exercises straight leg lower from 90 deg      Lumbar Exercises: Sidelying   Other Sidelying Lumbar Exercises hip abd+ flex/ext- UE press into ball      Manual Therapy   Manual therapy comments skilled palpation and monitoring during TPDN    Joint Mobilization Rt SIJ mobs, L2-5 PA gr 4, thoracic PA mobs    Soft tissue mobilization all muscles listed under TPDN flowsheet            Trigger Point Dry Needling - 08/03/20 0001    Muscles Treated Back/Hip Quadratus lumborum      Other Dry Needling Rt L3-4 paraspinals    Upper Trapezius Response Twitch reponse elicited;Palpable increased muscle length   Rt   Gluteus Maximus Response Twitch response elicited;Palpable increased muscle length   Rt   Quadratus Lumborum Response Twitch response elicited;Palpable increased muscle length   Rt                 PT Short Term Goals - 03/19/20 1717      PT SHORT TERM GOAL #1   Title Pt will be independent in postural correction    Status Achieved      PT SHORT TERM GOAL #2   Title pt will perform stretches at least 3 times daily  to be proactive about pain onset    Status Achieved             PT Long Term Goals - 07/05/20 1505      PT LONG TERM GOAL #1   Title Equal cervical rotation Rt to Lt without discomfort    Baseline see flowsheet, previously achieved but removed status today    Status On-going      PT LONG TERM GOAL #2   Title gross GHJ strength 5/5    Status Achieved      PT LONG TERM GOAL #3   Title pt will be able to complete a work week without onset of HA or N/T    Baseline reports 1-2 per week today    Status On-going      PT LONG TERM GOAL #4   Title pt will be able to carry and lift objects required for ADLs    Baseline I am still doing it but it hurts- due to weight    Status On-going      PT LONG TERM GOAL #5   Title Pt will be independent in long term HEP/gym program for continued strengthening    Baseline requires further progression    Status On-going      Additional Long Term Goals   Additional Long Term Goals Yes      PT LONG TERM GOAL #6   Title pt will demo hip extension without compensation and 5/5 bil    Baseline see flowsheet for description    Status New    Target Date 08/11/20      PT LONG TERM GOAL #7   Title pt will demo proper core activation in squating, bending and lifting activiites for support to  lumbar spin    Baseline requires further training    Status New    Target Date 08/11/20                  Plan - 08/03/20 1310    Clinical Impression Statement Is arriving with increased mobility of SIJ and overall decreased discomfort reported in neck and low back. Progressed core strength in HEP for support to lumbopelvic region which was challenging but did not increase pain.    PT Treatment/Interventions ADLs/Self Care Home Management;Cryotherapy;Electrical Stimulation;Ultrasound;Traction;Moist Heat;Iontophoresis 4mg /ml Dexamethasone;Therapeutic activities;Therapeutic exercise;Neuromuscular re-education;Manual techniques;Patient/family education;Passive range of motion;Dry needling;Taping;Joint Manipulations;Spinal Manipulations;Functional mobility training    PT Next Visit Plan cont core activation & lumbar mobility    PT Home Exercise Plan [id number], self correction for Lt post illium    Consulted and Agree with Plan of Care Patient           Patient will benefit from skilled therapeutic intervention in order to improve the following deficits and impairments:  Decreased range of motion, Impaired UE functional use, Increased muscle spasms, Decreased activity tolerance, Pain, Impaired flexibility, Improper body mechanics, Decreased strength, Impaired sensation, Postural dysfunction, Decreased mobility  Visit Diagnosis: Chronic right shoulder pain  Cervicalgia  Abnormal posture  Muscle weakness (generalized)  Chronic right-sided low back pain without sciatica     Problem List Patient Active Problem List   Diagnosis Date Noted  . Right shoulder injury 06/15/2018  . S/P arthroscopy of right shoulder 06/15/2018  . 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 OA (osteoarthritis) of knee 12/08/2016  . 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 Cervical facet joint syndrome 12/17/2015  . Chronic bilateral low back pain without sciatica 12/17/2015  . Primary osteoarthritis of left knee 12/17/2015    Adysson Revelle C. Daylan Boggess  PT, DPT 08/03/20 1:37 PM   [facility] [address] [place], [place], [address] Phone: [phone]   Fax:  [phone]  Name: EIDAN MUELLNER, MD MRN: [id number] Date of Birth: Jan 25, 1961

## 2020-08-10 ENCOUNTER — Encounter: Payer: Self-pay | Admitting: Physical Therapy

## 2020-08-10 ENCOUNTER — Ambulatory Visit: Payer: 59 | Admitting: Physical Therapy

## 2020-08-10 ENCOUNTER — Other Ambulatory Visit: Payer: Self-pay

## 2020-08-10 DIAGNOSIS — M25511 Pain in right shoulder: Secondary | ICD-10-CM | POA: Diagnosis not present

## 2020-08-10 DIAGNOSIS — G8929 Other chronic pain: Secondary | ICD-10-CM

## 2020-08-10 DIAGNOSIS — M6281 Muscle weakness (generalized): Secondary | ICD-10-CM | POA: Diagnosis not present

## 2020-08-10 DIAGNOSIS — R293 Abnormal posture: Secondary | ICD-10-CM

## 2020-08-10 DIAGNOSIS — M545 Low back pain, unspecified: Secondary | ICD-10-CM

## 2020-08-10 DIAGNOSIS — M542 Cervicalgia: Secondary | ICD-10-CM

## 2020-08-10 NOTE — Therapy (Signed)
[place], [place], [address] Phone: [phone]   Fax:  [phone]  Physical Therapy Treatment  Patient Details  Name: Daniel SCHICKER, [name] MRN: [id number] Date of Birth: 21-Feb-1961 Referring Provider (PT): [name], [name], [name], [name]   Encounter Date: 08/10/2020   PT End of Session - 08/10/20 1158    Visit Number 16    Number of Visits 22    Date for PT Re-Evaluation 10/12/20    Authorization Type UMR/Tricare    PT Start Time 1153    PT Stop Time 5284    PT Time Calculation (min) 42 min    Activity Tolerance Patient tolerated treatment well    Behavior During Therapy [facility] for tasks assessed/performed           Past Medical History:  Diagnosis Date   Anxiety    Aortic atherosclerosis ([place])    BPH (benign prostatic hyperplasia)    Chronic pain syndrome    neck and low back   DDD (degenerative disc disease), cervical    Diverticulosis    DJD (degenerative joint disease)    Ganglion cyst    12 mm , right posterior knee   Hyperlipidemia    Internal hemorrhoids    Migraines    OA (osteoarthritis)    knees, hands, right shoulder   Positive QuantiFERON-TB Gold test    Shoulder pain    Tear of right rotator cuff    Type 2 diabetes mellitus ([place])    last A1c 5.4 on 04-13-2018 in epic (followed by pcp)   Wears glasses     Past Surgical History:  Procedure Laterality Date   ANAL FISSURE REPAIR     ARTHOSCOPIC ROTAOR CUFF REPAIR Right 06/15/2018   Procedure: RIGHT SHOULDER ARTHROSCOPY, DEBRIDEMENT, SUBACROMIAL DECOMPRESSION, DISTAL CLAVICLE RESECTION, ROTATOR CUFF REPAIR;  Surgeon: [name], [name];  Location: [place];  Service: Orthopedics;  Laterality: Right;   COLONOSCOPY  05-26-2017   dr Henrene Pastor   Hip Resurface  2006   KNEE ARTHROSCOPY W/ MENISCAL REPAIR Bilateral right 1993; left 2010   MASS EXCISION Right 04/21/2018   Procedure: EXCISION  RIGHT THIGH SOFT TISSUE MASS;  Surgeon: [name], [name];  [facility];  Service: Orthopedics;  Laterality: Right;   SEPTOPLASTY     SHOULDER ARTHROSCOPY WITH ROTATOR CUFF REPAIR Left    SLAP Tear Repair  2008   TONSILLECTOMY     TOTAL KNEE ARTHROPLASTY Left 12/08/2016   Procedure: LEFT TOTAL KNEE ARTHROPLASTY;  Surgeon: [name], [name];  [facility];  Service: Orthopedics;  Laterality: Left;   TOTAL KNEE ARTHROPLASTY WITH REVISION COMPONENTS Left 04/21/2018   Procedure: Left knee polyethylene exchange ;  Surgeon: [name], [name];  [facility];  Service: Orthopedics;  Laterality: Left;    There were no vitals filed for this visit.       [facility] PT Assessment - 08/10/20 0001      Assessment   Medical Diagnosis Rt shoulder pain, LBP    Referring Provider (PT) [name], [name], [name], [name]    Onset Date/Surgical Date 06/15/18   lumbar is chronic     Sensation   Additional Comments every other day to daily now- sensitivity to light, no vision changes      Posture/Postural Control   Posture Comments neutral pelvic rotation, decr thoracic kyphosis      AROM   Cervical - Right Side Bend 32    Cervical - Left  Side Bend 30    Cervical - Right Rotation 76    Cervical - Left Rotation 74      Strength   Overall Strength Comments bil hip abd 4/5, Lt hip ext 4/5, mild compensation through lumbar spine                         [facility] Adult PT Treatment/Exercise - 08/10/20 0001      Manual Therapy   Manual therapy comments skilled palpation and monitoring during TPDN    Joint Mobilization Sidelying rt innom IR    Soft tissue mobilization Rt hip region following DN & Rt upper trap      Neck Exercises: Stretches   Other Neck Stretches tspine flexion/extension in chair            Trigger Point Dry Needling - 08/10/20 0001    Muscles Treated Back/Hip Tensor fascia lata    Upper Trapezius Response Twitch reponse elicited;Palpable  increased muscle length   Rt   Gluteus Maximus Response Twitch response elicited   Rt   Piriformis Response Twitch response elicited;Palpable increased muscle length   Rt   Tensor Fascia Lata Response Twitch response elicited;Palpable increased muscle length   Rt               PT Education - 08/10/20 1244    Education Details goals, POC, work/PT schedule    Person(s) Educated Patient    Methods Explanation    Comprehension Verbalized understanding;Need further instruction            PT Short Term Goals - 03/19/20 1717      PT SHORT TERM GOAL #1   Title Pt will be independent in postural correction    Status Achieved      PT SHORT TERM GOAL #2   Title pt will perform stretches at least 3 times daily  to be proactive about pain onset    Status Achieved             PT Long Term Goals - 08/10/20 1223      PT LONG TERM GOAL #1   Title Equal cervical rotation Rt to Lt without discomfort    Status Achieved      PT LONG TERM GOAL #2   Title gross GHJ strength 5/5    Status Achieved      PT LONG TERM GOAL #3   Title pt will be able to complete a work week without onset of HA or N/T    Baseline HA to daily or every other day      PT LONG TERM GOAL #4   Title pt will be able to carry and lift objects required for ADLs    Status Achieved      PT LONG TERM GOAL #5   Title Pt will be independent in long term HEP/gym program for continued strengthening    Status On-going      PT LONG TERM GOAL #6   Title pt will demo hip extension without compensation and 5/5 bil    Baseline Rt 5/5, Lt 4/5    Status On-going      PT LONG TERM GOAL #7   Title pt will demo proper core activation in squating, bending and lifting activiites for support to lumbar spin    Status On-going                 Plan - 08/10/20 1215    Clinical  Impression Statement Extended POC through the end of Dec. Is making good progress with functional ability considering work schedule and  limited ability to consistently attend PT sessions. 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 Some soreness in low back after going for a long walk consisting of hills which he has not done in a while. Decreasing compensation through QL as core and hips are strengthening. Will continue to benefit from pain management using TPDN and progressing challenges of functional activities such as bending and lifting.    PT Frequency 2x / week    PT Duration --   every other week until 12/31   PT Treatment/Interventions ADLs/Self Care Home Management;Cryotherapy;Electrical Stimulation;Ultrasound;Traction;Moist Heat;Iontophoresis 4mg /ml Dexamethasone;Therapeutic activities;Therapeutic exercise;Neuromuscular re-education;Manual techniques;Patient/family education;Passive range of motion;Dry needling;Taping;Joint Manipulations;Spinal Manipulations;Functional mobility training    PT Next Visit Plan cont core activation & lumbar mobility, squat to lift    PT Home Exercise Plan [id number], self correction for Lt post illium    Consulted and Agree with Plan of Care Patient           Patient will benefit from skilled therapeutic intervention in order to improve the following deficits and impairments:  Decreased range of motion, Impaired UE functional use, Increased muscle spasms, Decreased activity tolerance, Pain, Impaired flexibility, Improper body mechanics, Decreased strength, Impaired sensation, Postural dysfunction, Decreased mobility  Visit Diagnosis: Chronic right shoulder pain - Plan: PT plan of care cert/re-cert  Cervicalgia - Plan: PT plan of care cert/re-cert  Abnormal posture - Plan: PT plan of care cert/re-cert  Muscle weakness (generalized) - Plan: PT plan of care cert/re-cert  Chronic right-sided low back pain without sciatica - Plan: PT plan of care cert/re-cert     Problem List Patient Active Problem List   Diagnosis Date Noted   Right  shoulder injury 06/15/2018   S/P arthroscopy of right shoulder 06/15/2018   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 Lido Maske PT, DPT 08/10/20 12:49 PM   [facility] [address] [place], [place], [address] Phone: [phone]   Fax:  [phone]  Name: Daniel RAWL, [name] MRN: [id number] Date of Birth: January 10, 1961

## 2020-08-28 ENCOUNTER — Other Ambulatory Visit (HOSPITAL_COMMUNITY): Payer: Self-pay | Admitting: Internal Medicine

## 2020-08-28 MED FILL — LIDOCAINE PATCH 5%: 5 | 30 days supply | Qty: 60 | Fill #4

## 2020-08-28 MED FILL — METFORMIN HCL 1000 MG TABS: 1000 | 90 days supply | Qty: 180 | Fill #0

## 2020-08-28 MED FILL — HYDROCORTISONE ACETATE 25 M: 25 | 24 days supply | Qty: 24 | Fill #2

## 2020-08-28 MED FILL — BUTALB-ACETAMIN-CAFF 50-325: 50-325-40 | 30 days supply | Qty: 180 | Fill #1

## 2020-08-28 MED FILL — KETOCONAZOLE 2% CREAM: 2 | 15 days supply | Qty: 30 | Fill #0

## 2020-08-28 MED FILL — CYCLOBENZAPRINE HCL 10 MG T: 10 | 90 days supply | Qty: 270 | Fill #2

## 2020-08-28 MED FILL — traMADol HCL 50 MG TABS: 50 | 30 days supply | Qty: 240 | Fill #1

## 2020-08-28 MED FILL — DICYCLOMINE 20 MG TABLET: 20 | 10 days supply | Qty: 60 | Fill #2

## 2020-08-28 MED FILL — RIZATRIPTAN 5 MG ODT: 5 | 30 days supply | Qty: 15 | Fill #3

## 2020-08-30 DIAGNOSIS — M47812 Spondylosis without myelopathy or radiculopathy, cervical region: Secondary | ICD-10-CM | POA: Diagnosis not present

## 2020-09-11 ENCOUNTER — Ambulatory Visit: Payer: 59 | Attending: Specialist | Admitting: Physical Therapy

## 2020-09-21 ENCOUNTER — Other Ambulatory Visit (HOSPITAL_COMMUNITY): Payer: Self-pay | Admitting: Internal Medicine

## 2020-09-21 MED FILL — CELECOXIB 200 MG CAP: 200 | 90 days supply | Qty: 90 | Fill #1

## 2020-09-21 MED FILL — TAMSULOSIN HCL 0.4 MG CAP: 0.4 | 90 days supply | Qty: 180 | Fill #0

## 2020-09-24 MED FILL — TADALAFIL 5 MG TABS: 5 | 90 days supply | Qty: 90 | Fill #1

## 2020-09-24 MED FILL — oxyCODONE HCL 5 MG TABS: 5 | 30 days supply | Qty: 180 | Fill #0

## 2020-09-25 ENCOUNTER — Other Ambulatory Visit (HOSPITAL_COMMUNITY): Payer: Self-pay | Admitting: Internal Medicine

## 2020-09-25 MED FILL — diazePAM 5 MG TABS: 5 | 90 days supply | Qty: 180 | Fill #1

## 2020-09-26 MED FILL — PANTOPRAZOLE SOD DR 40 MG T: 40 | 90 days supply | Qty: 90 | Fill #0

## 2020-09-28 ENCOUNTER — Encounter: Payer: Self-pay | Admitting: Physical Therapy

## 2020-09-28 ENCOUNTER — Ambulatory Visit: Payer: 59 | Admitting: Physical Therapy

## 2020-09-28 ENCOUNTER — Other Ambulatory Visit: Payer: Self-pay

## 2020-09-28 ENCOUNTER — Ambulatory Visit: Payer: 59 | Attending: Specialist | Admitting: Physical Therapy

## 2020-09-28 DIAGNOSIS — R293 Abnormal posture: Secondary | ICD-10-CM | POA: Insufficient documentation

## 2020-09-28 DIAGNOSIS — M6281 Muscle weakness (generalized): Secondary | ICD-10-CM | POA: Insufficient documentation

## 2020-09-28 DIAGNOSIS — G8929 Other chronic pain: Secondary | ICD-10-CM | POA: Insufficient documentation

## 2020-09-28 DIAGNOSIS — M25511 Pain in right shoulder: Secondary | ICD-10-CM | POA: Diagnosis not present

## 2020-09-28 DIAGNOSIS — M25611 Stiffness of right shoulder, not elsewhere classified: Secondary | ICD-10-CM | POA: Diagnosis not present

## 2020-09-28 DIAGNOSIS — M545 Low back pain, unspecified: Secondary | ICD-10-CM | POA: Diagnosis not present

## 2020-09-28 DIAGNOSIS — M542 Cervicalgia: Secondary | ICD-10-CM | POA: Insufficient documentation

## 2020-09-28 NOTE — Therapy (Signed)
[place], [place], [address] Phone: [phone]   Fax:  [phone]  Physical Therapy Treatment  Patient Details  Name: Daniel PROIA, MD MRN: [id number] Date of Birth: [birth year] Referring Provider (PT): [name], MD, [name], MD   Encounter Date: 09/28/2020   PT End of Session - 09/28/20 1359    Visit Number 17    Number of Visits 22    Date for PT Re-Evaluation 10/12/20    Authorization Type UMR/[employer]    PT Start Time 1359    PT Stop Time 1442    PT Time Calculation (min) 43 min    Activity Tolerance Patient tolerated treatment well    Behavior During Therapy [facility] for tasks assessed/performed           Past Medical History:  Diagnosis Date  . Anxiety   . Aortic atherosclerosis ([place])   . BPH (benign prostatic hyperplasia)   . Chronic pain syndrome    neck and low back  . DDD (degenerative disc disease), cervical   . Diverticulosis   . DJD (degenerative joint disease)   . Ganglion cyst    12 mm , right posterior knee  . Hyperlipidemia   . Internal hemorrhoids   . Migraines   . OA (osteoarthritis)    knees, hands, right shoulder  . Positive QuantiFERON-TB Gold test   . Shoulder pain   . Tear of right rotator cuff   . Type 2 diabetes mellitus ([place])    last A1c 5.4 on 04-13-2018 in epic (followed by pcp)  . Wears glasses     Past Surgical History:  Procedure Laterality Date  . ANAL FISSURE REPAIR    . ARTHOSCOPIC ROTAOR CUFF REPAIR Right 06/15/2018   Procedure: RIGHT SHOULDER ARTHROSCOPY, DEBRIDEMENT, SUBACROMIAL DECOMPRESSION, DISTAL CLAVICLE RESECTION, ROTATOR CUFF REPAIR;  Surgeon: [name], MD;  Location: [facility];  Service: Orthopedics;  Laterality: Right;  . COLONOSCOPY  05-26-2017   dr Henrene Pastor  . Hip Resurface  2006  . KNEE ARTHROSCOPY W/ MENISCAL REPAIR Bilateral right 1993; left 2010  . MASS EXCISION Right 04/21/2018   Procedure: EXCISION  RIGHT THIGH SOFT TISSUE MASS;  Surgeon: [name], MD;  Location: [facility];  Service: Orthopedics;  Laterality: Right;  . SEPTOPLASTY    . SHOULDER ARTHROSCOPY WITH ROTATOR CUFF REPAIR Left   . SLAP Tear Repair  2008  . TONSILLECTOMY    . 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 TOTAL KNEE ARTHROPLASTY WITH REVISION COMPONENTS Left 04/21/2018   Procedure: Left knee polyethylene exchange ;  Surgeon: [name], MD;  Location: [facility];  Service: Orthopedics;  Laterality: Left;    There were no vitals filed for this visit.   Subjective Assessment - 09/28/20 1415    Subjective " I have been havin gincreased pain my neck and back since I have been working the last 8 days"    Currently in Pain? Yes    Pain Score 7     Pain Location Neck    Pain Orientation Right    Pain Descriptors / Indicators Tightness    Pain Type Chronic pain    Pain Onset More than a month ago    Pain Frequency Constant    Aggravating Factors  prlonged working    Pain Score 7    Pain Location Back  Pain Orientation Right;Lower    Pain Descriptors / Indicators Aching;Sore    Pain Type Chronic pain    Pain Onset More than a month ago    Pain Frequency Intermittent    Aggravating Factors  prolonged standing/ walking                             [facility] Adult PT Treatment/Exercise - 09/28/20 0001      Manual Therapy   Manual therapy comments skilled palpation and monitoring during TPDN    Joint Mobilization C3-C7 PA grade IV    Soft tissue mobilization IASTM along bil upper trap / levator scapuale and r lumbar parapsinals            Trigger Point Dry Needling - 09/28/20 0001    Muscles Treated Head and Neck Suboccipitals;Cervical multifidi    Muscles Treated Back/Hip Lumbar multifidi    Electrical Stimulation Performed with Dry Needling Yes    E-stim  with Dry Needling Details frequency18, x 10 min increasing to tolerance intermittnet for lumbar multifidi only    Upper Trapezius Response Palpable increased muscle length;Twitch reponse elicited   R   Suboccipitals Response Twitch response elicited;Palpable increased muscle length   bil   Levator Scapulae Response Twitch response elicited;Palpable increased muscle length   C3 and C6 bil   Lumbar multifidi Response Twitch response elicited;Palpable increased muscle length   R                 PT Short Term Goals - 03/19/20 1717      PT SHORT TERM GOAL #1   Title Pt will be independent in postural correction    Status Achieved      PT SHORT TERM GOAL #2   Title pt will perform stretches at least 3 times daily  to be proactive about pain onset    Status Achieved             PT Long Term Goals - 08/10/20 1223      PT LONG TERM GOAL #1   Title Equal cervical rotation Rt to Lt without discomfort    Status Achieved      PT LONG TERM GOAL #2   Title gross GHJ strength 5/5    Status Achieved      PT LONG TERM GOAL #3   Title pt will be able to complete a work week without onset of HA or N/T    Baseline HA to daily or every other day      PT LONG TERM GOAL #4   Title pt will be able to carry and lift objects required for ADLs    Status Achieved      PT LONG TERM GOAL #5   Title Pt will be independent in long term HEP/gym program for continued strengthening    Status On-going      PT LONG TERM GOAL #6   Title pt will demo hip extension without compensation and 5/5 bil    Baseline Rt 5/5, Lt 4/5    Status On-going      PT LONG TERM GOAL #7   Title pt will demo proper core activation in squating, bending and lifting activiites for support to lumbar spin    Status On-going                 Plan - 09/28/20 1500    Clinical Impression Statement pt arrives noting continued neck and  low back pain that has progessisvely worsened due to working mutliple days in a  row. continued TPDN for upper trap / levator scapula, cervical multifid/ sub-occipitals, and lumbar multifidi on the R which was combined with E-stim. During E-stim concordant cervical and shoulder STW was performed. trialed R shoulder inhibition taping to calm down upper trap activation. end fo session he noted relief of pain in both neck and shoulder.    PT Next Visit Plan cont core activation & lumbar mobility, squat to lift, trial SIJ belt, how was upper trap taping.    PT Home Exercise Plan [id number], self correction for Lt post illium    Consulted and Agree with Plan of Care Patient           Patient will benefit from skilled therapeutic intervention in order to improve the following deficits and impairments:  Decreased range of motion,Impaired UE functional use,Increased muscle spasms,Decreased activity tolerance,Pain,Impaired flexibility,Improper body mechanics,Decreased strength,Impaired sensation,Postural dysfunction,Decreased mobility  Visit Diagnosis: Chronic right shoulder pain  Cervicalgia  Abnormal posture  Muscle weakness (generalized)  Chronic right-sided low back pain without sciatica  Stiffness of right shoulder, not elsewhere classified     Problem List Patient Active Problem List   Diagnosis Date Noted  . Right shoulder injury 06/15/2018  . S/P arthroscopy of right shoulder 06/15/2018  . Failed total knee arthroplasty ([place]) 04/21/2018  . OA (osteoarthritis) of knee 12/08/2016  . 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 Cervical facet joint syndrome 12/17/2015  . Chronic bilateral low back pain without sciatica 12/17/2015  . Primary osteoarthritis of left knee 12/17/2015    Starr Lake 09/28/2020, 3:05 PM  [facility] [address] [place], [place], [address] Phone: [phone]   Fax:  [phone]  Name: ISAUL LANDI, MD MRN: [id number] Date of  Birth: 04/30/1961

## 2020-09-28 NOTE — Therapy (Signed)
[name], [address] Phone: [phone]   Fax:  [phone]  Physical Therapy Treatment  Patient Details  Name: Daniel STREIGHT, MD MRN: [id number] Date of Birth: 1961-07-30 Referring Provider (PT): Daniel Cabal, MD, Daniel Arabian, MD   Encounter Date: 09/28/2020   PT End of Session - 09/28/20 1359    Visit Number 17    Number of Visits 22    Date for PT Re-Evaluation 10/12/20    Authorization Type UMR/[employer]    PT Start Time 1359    PT Stop Time 1442    PT Time Calculation (min) 43 min    Activity Tolerance Patient tolerated treatment well    Behavior During Therapy [facility] for tasks assessed/performed           Past Medical History:  Diagnosis Date   Anxiety    Aortic atherosclerosis ([place])    BPH (benign prostatic hyperplasia)    Chronic pain syndrome    neck and low back   DDD (degenerative disc disease), cervical    Diverticulosis    DJD (degenerative joint disease)    Ganglion cyst    12 mm , right posterior knee   Hyperlipidemia    Internal hemorrhoids    Migraines    OA (osteoarthritis)    knees, hands, right shoulder   Positive QuantiFERON-TB Gold test    Shoulder pain    Tear of right rotator cuff    Type 2 diabetes mellitus ([place])    last A1c 5.4 on 04-13-2018 in epic (followed by pcp)   Wears glasses     Past Surgical History:  Procedure Laterality Date   ANAL FISSURE REPAIR     ARTHOSCOPIC ROTAOR CUFF REPAIR Right 06/15/2018   Procedure: RIGHT SHOULDER ARTHROSCOPY, DEBRIDEMENT, SUBACROMIAL DECOMPRESSION, DISTAL CLAVICLE RESECTION, ROTATOR CUFF REPAIR;  Surgeon: Daniel Cabal, MD;  Location: [facility];  Service: Orthopedics;  Laterality: Right;   COLONOSCOPY  05-26-2017   [name]   Hip Resurface  2006   KNEE ARTHROSCOPY W/ MENISCAL REPAIR Bilateral right 1993; left 2010   MASS EXCISION Right 04/21/2018   Procedure: EXCISION  RIGHT THIGH SOFT TISSUE MASS;  Surgeon: Daniel Arabian, MD;  [facility];  Service: Orthopedics;  Laterality: Right;   SEPTOPLASTY     SHOULDER ARTHROSCOPY WITH ROTATOR CUFF REPAIR Left    SLAP Tear Repair  2008   TONSILLECTOMY     TOTAL KNEE ARTHROPLASTY Left 12/08/2016   Procedure: LEFT TOTAL KNEE ARTHROPLASTY;  Surgeon: Daniel Arabian, MD;  [facility];  Service: Orthopedics;  Laterality: Left;   TOTAL KNEE ARTHROPLASTY WITH REVISION COMPONENTS Left 04/21/2018   Procedure: Left knee polyethylene exchange ;  Surgeon: Daniel Arabian, MD;  [facility];  Service: Orthopedics;  Laterality: Left;    There were no vitals filed for this visit.   Subjective Assessment - 09/28/20 1415    Subjective " I have been havin gincreased pain my neck and back since I have been working the last 8 days"    Currently in Pain? Yes    Pain Score 7     Pain Location Neck    Pain Orientation Right    Pain Descriptors / Indicators Tightness    Pain Type Chronic pain    Pain Onset More than a month ago    Pain Frequency Constant    Aggravating Factors  prlonged working    Pain Score 7    Pain Location Back  Pain Orientation Right;Lower    Pain Descriptors / Indicators Aching;Sore    Pain Type Chronic pain    Pain Onset More than a month ago    Pain Frequency Intermittent    Aggravating Factors  prolonged standing/ walking                             [facility] Adult PT Treatment/Exercise - 09/28/20 0001      Manual Therapy   Manual Therapy Taping    Manual therapy comments skilled palpation and monitoring during TPDN    Joint Mobilization C3-C7 PA grade IV    Soft tissue mobilization IASTM along bil upper trap / levator scapuale and r lumbar parapsinals    [place] R upper trap inhibition taping            Trigger Point Dry Needling - 09/28/20 0001    Muscles Treated Head and Neck Suboccipitals;Cervical multifidi    Muscles Treated Back/Hip Lumbar multifidi     Electrical Stimulation Performed with Dry Needling Yes    E-stim with Dry Needling Details frequency18, x 10 min increasing to tolerance intermittnet for lumbar multifidi only    Upper Trapezius Response Palpable increased muscle length;Twitch reponse elicited   R   Suboccipitals Response Twitch response elicited;Palpable increased muscle length   bil   Levator Scapulae Response Twitch response elicited;Palpable increased muscle length   C3 and C6 bil   Lumbar multifidi Response Twitch response elicited;Palpable increased muscle length   R                 PT Short Term Goals - 03/19/20 1717      PT SHORT TERM GOAL #1   Title Pt will be independent in postural correction    Status Achieved      PT SHORT TERM GOAL #2   Title pt will perform stretches at least 3 times daily  to be proactive about pain onset    Status Achieved             PT Long Term Goals - 08/10/20 1223      PT LONG TERM GOAL #1   Title Equal cervical rotation Rt to Lt without discomfort    Status Achieved      PT LONG TERM GOAL #2   Title gross GHJ strength 5/5    Status Achieved      PT LONG TERM GOAL #3   Title pt will be able to complete a work week without onset of HA or N/T    Baseline HA to daily or every other day      PT LONG TERM GOAL #4   Title pt will be able to carry and lift objects required for ADLs    Status Achieved      PT LONG TERM GOAL #5   Title Pt will be independent in long term HEP/gym program for continued strengthening    Status On-going      PT LONG TERM GOAL #6   Title pt will demo hip extension without compensation and 5/5 bil    Baseline Rt 5/5, Lt 4/5    Status On-going      PT LONG TERM GOAL #7   Title pt will demo proper core activation in squating, bending and lifting activiites for support to lumbar spin    Status On-going                 Plan -  09/28/20 1500    Clinical Impression Statement pt arrives noting continued neck and low back pain  that has progessisvely worsened due to working mutliple days in a row. continued TPDN for upper trap / levator scapula, cervical multifid/ sub-occipitals, and lumbar multifidi on the R which was combined with E-stim. During E-stim concordant cervical and shoulder STW was performed. trialed R shoulder inhibition taping to calm down upper trap activation. end fo session he noted relief of pain in both neck and shoulder.    PT Next Visit Plan cont core activation & lumbar mobility, squat to lift, trial SIJ belt, how was upper trap taping.    PT Home Exercise Plan [id number], self correction for Lt post illium    Consulted and Agree with Plan of Care Patient           Patient will benefit from skilled therapeutic intervention in order to improve the following deficits and impairments:  Decreased range of motion,Impaired UE functional use,Increased muscle spasms,Decreased activity tolerance,Pain,Impaired flexibility,Improper body mechanics,Decreased strength,Impaired sensation,Postural dysfunction,Decreased mobility  Visit Diagnosis: Chronic right shoulder pain  Cervicalgia  Abnormal posture  Muscle weakness (generalized)  Chronic right-sided low back pain without sciatica  Stiffness of right shoulder, not elsewhere classified     Problem List Patient Active Problem List   Diagnosis Date Noted   Right shoulder injury 06/15/2018   S/P arthroscopy of right shoulder 06/15/2018   Failed total knee arthroplasty ([place]) 04/21/2018   OA (osteoarthritis) of knee 12/08/2016   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 without status migrainosus 12/17/2015   Cervical facet joint syndrome 12/17/2015   Chronic bilateral low back pain without sciatica 12/17/2015   Primary osteoarthritis of left knee 12/17/2015    Starr Lake PT, DPT, LAT, ATC  09/28/20  3:07 PM      [facility] [address] [place], [place],  [address] Phone: [phone]   Fax:  [phone]  Name: Daniel LACK, MD MRN: [id number] Date of Birth: 03-06-1961

## 2020-10-10 ENCOUNTER — Telehealth: Payer: Self-pay | Admitting: Physical Therapy

## 2020-10-10 NOTE — Telephone Encounter (Signed)
LVM regarding scheduled apt on 12/30- advised need to cancel and offered an 11:00 that is available. Requested call back if interested. Nicey Krah C. Matalynn Graff PT, DPT 10/10/20 12:29 PM

## 2020-10-11 ENCOUNTER — Ambulatory Visit: Payer: 59 | Admitting: Physical Therapy

## 2020-10-11 DIAGNOSIS — Z0000 Encounter for general adult medical examination without abnormal findings: Secondary | ICD-10-CM | POA: Diagnosis not present

## 2020-10-11 DIAGNOSIS — E1151 Type 2 diabetes mellitus with diabetic peripheral angiopathy without gangrene: Secondary | ICD-10-CM | POA: Diagnosis not present

## 2020-10-11 DIAGNOSIS — E785 Hyperlipidemia, unspecified: Secondary | ICD-10-CM | POA: Diagnosis not present

## 2020-10-11 DIAGNOSIS — Z125 Encounter for screening for malignant neoplasm of prostate: Secondary | ICD-10-CM | POA: Diagnosis not present

## 2020-10-16 DIAGNOSIS — M546 Pain in thoracic spine: Secondary | ICD-10-CM | POA: Diagnosis not present

## 2020-10-16 DIAGNOSIS — M545 Low back pain, unspecified: Secondary | ICD-10-CM | POA: Diagnosis not present

## 2020-10-16 DIAGNOSIS — M9903 Segmental and somatic dysfunction of lumbar region: Secondary | ICD-10-CM | POA: Diagnosis not present

## 2020-10-16 DIAGNOSIS — M542 Cervicalgia: Secondary | ICD-10-CM | POA: Diagnosis not present

## 2020-10-18 DIAGNOSIS — I739 Peripheral vascular disease, unspecified: Secondary | ICD-10-CM | POA: Diagnosis not present

## 2020-10-18 DIAGNOSIS — R3911 Hesitancy of micturition: Secondary | ICD-10-CM | POA: Diagnosis not present

## 2020-10-18 DIAGNOSIS — M542 Cervicalgia: Secondary | ICD-10-CM | POA: Diagnosis not present

## 2020-10-18 DIAGNOSIS — R972 Elevated prostate specific antigen [PSA]: Secondary | ICD-10-CM | POA: Diagnosis not present

## 2020-10-18 DIAGNOSIS — I10 Essential (primary) hypertension: Secondary | ICD-10-CM | POA: Diagnosis not present

## 2020-10-18 DIAGNOSIS — R82998 Other abnormal findings in urine: Secondary | ICD-10-CM | POA: Diagnosis not present

## 2020-10-18 DIAGNOSIS — E1151 Type 2 diabetes mellitus with diabetic peripheral angiopathy without gangrene: Secondary | ICD-10-CM | POA: Diagnosis not present

## 2020-10-18 DIAGNOSIS — G43909 Migraine, unspecified, not intractable, without status migrainosus: Secondary | ICD-10-CM | POA: Diagnosis not present

## 2020-10-18 DIAGNOSIS — Z0000 Encounter for general adult medical examination without abnormal findings: Secondary | ICD-10-CM | POA: Diagnosis not present

## 2020-10-30 ENCOUNTER — Other Ambulatory Visit: Payer: Self-pay

## 2020-10-30 ENCOUNTER — Ambulatory Visit: Payer: 59 | Attending: Specialist | Admitting: Physical Therapy

## 2020-10-30 ENCOUNTER — Ambulatory Visit: Payer: 59 | Admitting: Physical Therapy

## 2020-10-30 ENCOUNTER — Encounter: Payer: Self-pay | Admitting: Physical Therapy

## 2020-10-30 DIAGNOSIS — M542 Cervicalgia: Secondary | ICD-10-CM | POA: Diagnosis not present

## 2020-10-30 DIAGNOSIS — M25611 Stiffness of right shoulder, not elsewhere classified: Secondary | ICD-10-CM | POA: Diagnosis not present

## 2020-10-30 DIAGNOSIS — R293 Abnormal posture: Secondary | ICD-10-CM | POA: Diagnosis not present

## 2020-10-30 DIAGNOSIS — M545 Low back pain, unspecified: Secondary | ICD-10-CM | POA: Diagnosis not present

## 2020-10-30 DIAGNOSIS — M25511 Pain in right shoulder: Secondary | ICD-10-CM | POA: Diagnosis not present

## 2020-10-30 DIAGNOSIS — G8929 Other chronic pain: Secondary | ICD-10-CM | POA: Diagnosis not present

## 2020-10-30 DIAGNOSIS — M6281 Muscle weakness (generalized): Secondary | ICD-10-CM | POA: Insufficient documentation

## 2020-10-30 DIAGNOSIS — Z1212 Encounter for screening for malignant neoplasm of rectum: Secondary | ICD-10-CM | POA: Diagnosis not present

## 2020-10-30 NOTE — Therapy (Signed)
[facility] [address] [place], [place], [address] Phone: [phone]   Fax:  [phone]  Physical Therapy Treatment/Recertification  Patient Details  Name: Daniel DallasCurtis J Pruitt, MD MRN: [id number] Date of Birth: 1961/07/28 Referring Provider (PT): Eugenia Mcalpineobert Collins, MD, Ollen GrossFrank Aluisio, MD   Encounter Date: 10/30/2020   PT End of Session - 10/30/20 1459    Visit Number 18    Number of Visits 29    Date for PT Re-Evaluation 12/11/20    Authorization Type UMR/[employer]    PT Start Time 1340    PT Stop Time 1445   time spent for dry needling and estim not included in direct tx. minutes   PT Time Calculation (min) 65 min    Activity Tolerance Patient tolerated treatment well    Behavior [facility] for tasks assessed/performed           Past Medical History:  Diagnosis Date  . Anxiety   . Aortic atherosclerosis (HCC)   . BPH (benign prostatic hyperplasia)   . Chronic pain syndrome    neck and low back  . DDD (degenerative disc disease), cervical   . Diverticulosis   . DJD (degenerative joint disease)   . Ganglion cyst    12 mm , right posterior knee  . Hyperlipidemia   . Internal hemorrhoids   . Migraines   . OA (osteoarthritis)    knees, hands, right shoulder  . Positive QuantiFERON-TB Gold test   . Shoulder pain   . Tear of right rotator cuff   . Type 2 diabetes mellitus (HCC)    last A1c 5.4 on 04-13-2018 in epic (followed by pcp)  . Wears glasses     Past Surgical History:  Procedure Laterality Date  . ANAL FISSURE REPAIR    . ARTHOSCOPIC ROTAOR CUFF REPAIR Right 06/15/2018   Procedure: RIGHT SHOULDER ARTHROSCOPY, DEBRIDEMENT, SUBACROMIAL DECOMPRESSION, DISTAL CLAVICLE RESECTION, ROTATOR CUFF REPAIR;  Surgeon: Eugenia Mcalpineollins, Robert, MD;  [facility];  Service: Orthopedics;  Laterality: Right;  . COLONOSCOPY  05-26-2017   dr Marina Goodellperry  . Hip Resurface  2006  . KNEE ARTHROSCOPY W/ MENISCAL REPAIR  Bilateral right 1993; left 2010  . MASS EXCISION Right 04/21/2018   Procedure: EXCISION RIGHT THIGH SOFT TISSUE MASS;  Surgeon: Ollen GrossAluisio, Frank, MD;  Location: [facility];  Service: Orthopedics;  Laterality: Right;  . SEPTOPLASTY    . SHOULDER ARTHROSCOPY WITH ROTATOR CUFF REPAIR Left   . SLAP Tear Repair  2008  . TONSILLECTOMY    . TOTAL KNEE ARTHROPLASTY Left 12/08/2016   Procedure: LEFT TOTAL KNEE ARTHROPLASTY;  Surgeon: Ollen GrossFrank Aluisio, MD;  Location: [facility];  Service: Orthopedics;  Laterality: Left;  . TOTAL KNEE ARTHROPLASTY WITH REVISION COMPONENTS Left 04/21/2018   Procedure: Left knee polyethylene exchange ;  Surgeon: Ollen GrossAluisio, Frank, MD;  Location: [facility];  Service: Orthopedics;  Laterality: Left;    There were no vitals filed for this visit.   Subjective Assessment - 10/30/20 1410    Subjective Pt. returns, not seen since 09/28/20 with some visits cancelled due to therapist out and delay getting rescheduled. Primary complaint today is right cervical and upper trapezius region pain today 7-8/10 with symptom exacerbation with increased time in between therapy visits. Still noting right lumbar pain today 6/10/not as severe as neck.    Currently in Pain? Yes    Pain Score --   7-8   Pain Location Neck    Pain Orientation Right    Pain  Descriptors / Indicators Tightness    Pain Type Chronic pain    Pain Onset More than a month ago    Pain Frequency Constant    Aggravating Factors  work activities    Pain Relieving Factors DN, stretches/exercises    Multiple Pain Sites Yes    Pain Score 6    Pain Location Back    Pain Orientation Right;Lower    Pain Descriptors / Indicators Aching;Sore    Pain Type Chronic pain    Pain Onset More than a month ago    Pain Frequency Intermittent    Aggravating Factors  prolonged standing and walking    Pain Relieving Factors stretches              [facility] PT Assessment - 10/30/20 0001      Assessment   Medical Diagnosis Rt shoulder pain, LBP     Referring Provider (PT) [name], MD, [name], MD    Onset Date/Surgical Date 06/15/18   lumbar is chronic     Observation/Other Assessments   Focus on Therapeutic Outcomes (FOTO)  45% limited      AROM   Overall AROM Comments Trunk AROM: flexion 80 deg, extension 10 deg, sidebending right 22 deg, left 23 deg, rotation 50% bilat.    Right Shoulder Flexion 150 Degrees    Right Shoulder ABduction 140 Degrees    Right Shoulder Internal Rotation --   reach to T10   Right Shoulder External Rotation 60 Degrees   at 0 deg abduction   Cervical Flexion 34    Cervical Extension 30    Cervical - Right Side Bend 22    Cervical - Left Side Bend 30    Cervical - Right Rotation 60    Cervical - Left Rotation 53      Strength   Overall Strength Comments Bilat. hip abduction and extension 4/5, right shoulder grossly 5/5                         [facility] Adult PT Treatment/Exercise - 10/30/20 0001      Lumbar Exercises: Stretches   Other Lumbar Stretch Exercise demo right posterior innominate self stretch with step and bend with right foot on step/chair vs. seated figure 4 variation      Manual Therapy   Joint Mobilization Lumbar PAs grade II-IV L4-5 region, right unilateral innominate PA grade I-III    Soft tissue mobilization STM right lumbar paraspinals and supine trigger point release suboccipitals as well as suboccipital release      Neck Exercises: Stretches   Upper Trapezius Stretch Right;3 reps;30 seconds    Levator Stretch Right;3 reps;30 seconds            Trigger Point Dry Needling - 10/30/20 0001    Consent Given? Yes    Muscles Treated Head and Neck Upper trapezius;Semispinalis capitus;Cervical multifidi   right side   Muscles Treated Back/Hip Gluteus maximus;Lumbar multifidi;Erector spinae   right side   Dry Needling Comments needling in prone with 30-32 gauge 50 mm needles for cervical and upper trapezius region and 30 gauge 75 mm needles for lumbar  glut region, also brief needling of right upper trapezius in left sidelying    Electrical Stimulation Performed with Dry Needling Yes    E-stim with Dry Needling Details TENS 2 pps x 10 minutes to right upper trapezius and lumbar longissimus  PT Education - 10/30/20 1459    Education Details SI self stretches/ROM    Person(s) Educated Patient    Methods Explanation;Demonstration    Comprehension Verbalized understanding            PT Short Term Goals - 03/19/20 1717      PT SHORT TERM GOAL #1   Title Pt will be independent in postural correction    Status Achieved      PT SHORT TERM GOAL #2   Title pt will perform stretches at least 3 times daily  to be proactive about pain onset    Status Achieved             PT Long Term Goals - 10/30/20 1640      PT LONG TERM GOAL #1   Title Equal cervical rotation Rt to Lt without discomfort    Baseline Right 60 deg, left 53 deg, painful right cervical region    Time 6    Period Weeks    Status On-going    Target Date 12/11/20      PT LONG TERM GOAL #2   Title gross GHJ strength 5/5    Baseline 5/5    Time 6    Period Weeks    Status Achieved      PT LONG TERM GOAL #3   Title pt will be able to complete a work week without onset of HA or N/T    Baseline ongoing    Time 6    Period Weeks    Status On-going    Target Date 12/11/20      PT LONG TERM GOAL #4   Title pt will be able to carry and lift objects required for ADLs    Baseline I am still doing it but it hurts- due to weight    Time 6    Period Weeks    Status Achieved      PT LONG TERM GOAL #5   Title Pt will be independent in long term HEP/gym program for continued strengthening    Baseline requires further progression    Time 6    Period Weeks    Status On-going    Target Date 12/11/20      PT LONG TERM GOAL #6   Title pt will demo hip extension without compensation and 5/5 bil    Baseline 4/5 bilat. 10/30/20    Time 6    Period  Weeks    Status On-going    Target Date 12/11/20      PT LONG TERM GOAL #7   Title pt will demo proper core activation in squating, bending and lifting activiites for support to lumbar spin    Baseline ongoing    Time 6    Period Weeks    Status On-going    Target Date 12/11/20                 Plan - 10/30/20 1500    Clinical Impression Statement Pt. returns after 1 month absence from therapy for reasons as noted in subjective with exacerbation of cervical/right upper trapezius region pain as well as continued right lumbar symptoms. Presentation consistent with myofascial etiology with underlying degenerative changes/he does note some intermittent more distal right UE radiating symptoms but primary pain local to neck and upper trapezius region with reproduction of concordant pain on palpation. For lumbar region symptoms local to paraspinal and gluteal region/minimal SI symptoms today though (SI) symptoms intermittent/ongoing. He did note  symptom relief with right unilateral innominate PA/posterior innominate rotation directional preference so brief HEP review for potential self-stretches to help address. Plan resume/continue PT for further progress to help relieve pain and address associated functional limitations for positional and activity tolerance.    Personal Factors and Comorbidities Comorbidity 3+;Profession;Fitness;Time since onset of injury/illness/exacerbation    Comorbidities OA, h/o RCR, chronic pain, DDD, DJD    Examination-Activity Limitations Bathing;Reach Overhead;Caring for Others;Sleep;Carry;Lift;Bend;Stand;Squat;Stairs;Other;Sit    Examination-Participation Restrictions Occupation    Stability/Clinical Decision Making Evolving/Moderate complexity    Clinical Decision Making Moderate    Rehab Potential Good    PT Frequency 2x / week    PT Duration 6 weeks    PT Treatment/Interventions ADLs/Self Care Home Management;Cryotherapy;Electrical  Stimulation;Ultrasound;Traction;Moist Heat;Iontophoresis 4mg /ml Dexamethasone;Therapeutic activities;Therapeutic exercise;Neuromuscular re-education;Manual techniques;Patient/family education;Passive range of motion;Dry needling;Taping;Joint Manipulations;Spinal Manipulations;Functional mobility training    PT Next Visit Plan continue dry needling, manual, exercise progression, squat to lift, stretches, lumobpelvic and cervical stabilization    PT Home Exercise Plan [id number], self correction for Lt post illium    Consulted and Agree with Plan of Care Patient           Patient will benefit from skilled therapeutic intervention in order to improve the following deficits and impairments:  Decreased range of motion,Impaired UE functional use,Increased muscle spasms,Decreased activity tolerance,Pain,Impaired flexibility,Improper body mechanics,Decreased strength,Impaired sensation,Postural dysfunction,Decreased mobility  Visit Diagnosis: Chronic right shoulder pain  Cervicalgia  Abnormal posture  Muscle weakness (generalized)  Chronic right-sided low back pain without sciatica  Stiffness of right shoulder, not elsewhere classified     Problem List Patient Active Problem List   Diagnosis Date Noted  . Right shoulder injury 06/15/2018  . S/P arthroscopy of right shoulder 06/15/2018  . Failed total knee arthroplasty ([place]) 04/21/2018  . OA (osteoarthritis) of knee 12/08/2016  . 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 Cervical facet joint syndrome 12/17/2015  . Chronic bilateral low back pain without sciatica 12/17/2015  . Primary osteoarthritis of left knee 12/17/2015   Beaulah Dinning, PT, DPT 10/30/20 4:44 PM  [facility] [address] [place], [place], [address] Phone: [phone]   Fax:  [phone]  Name: JAYSON WATERHOUSE, MD MRN: [id number] Date of Birth: 01/21/1961

## 2020-11-08 ENCOUNTER — Ambulatory Visit: Payer: 59 | Admitting: Physical Therapy

## 2020-11-08 ENCOUNTER — Other Ambulatory Visit: Payer: Self-pay

## 2020-11-08 ENCOUNTER — Encounter: Payer: Self-pay | Admitting: Physical Therapy

## 2020-11-08 DIAGNOSIS — M545 Low back pain, unspecified: Secondary | ICD-10-CM | POA: Diagnosis not present

## 2020-11-08 DIAGNOSIS — M542 Cervicalgia: Secondary | ICD-10-CM | POA: Diagnosis not present

## 2020-11-08 DIAGNOSIS — M25511 Pain in right shoulder: Secondary | ICD-10-CM | POA: Diagnosis not present

## 2020-11-08 DIAGNOSIS — G8929 Other chronic pain: Secondary | ICD-10-CM

## 2020-11-08 DIAGNOSIS — M6281 Muscle weakness (generalized): Secondary | ICD-10-CM | POA: Diagnosis not present

## 2020-11-08 DIAGNOSIS — R293 Abnormal posture: Secondary | ICD-10-CM | POA: Diagnosis not present

## 2020-11-08 DIAGNOSIS — M25611 Stiffness of right shoulder, not elsewhere classified: Secondary | ICD-10-CM | POA: Diagnosis not present

## 2020-11-08 NOTE — Therapy (Signed)
[place], [place], [address] Phone: [phone]   Fax:  [phone]  Physical Therapy Treatment  Patient Details  Name: Daniel VONGPHACHANH, MD MRN: [id number] Date of Birth: Aug 04, 1961 Referring Provider (PT): [name], MD, [name], MD   Encounter Date: 11/08/2020   PT End of Session - 11/08/20 1451    Visit Number 19    Number of Visits 29    Date for PT Re-Evaluation 12/11/20    Authorization Type UMR/Tric[employer]    PT Start Time 1450    PT Stop Time 1538    PT Time Calculation (min) 48 min    Activity Tolerance Patient tolerated treatment well    Behavior During Therapy [facility] for tasks assessed/performed           Past Medical History:  Diagnosis Date  . Anxiety   . Aortic atherosclerosis ([place])   . BPH (benign prostatic hyperplasia)   . Chronic pain syndrome    neck and low back  . DDD (degenerative disc disease), cervical   . Diverticulosis   . DJD (degenerative joint disease)   . Ganglion cyst    12 mm , right posterior knee  . Hyperlipidemia   . Internal hemorrhoids   . Migraines   . OA (osteoarthritis)    knees, hands, right shoulder  . Positive QuantiFERON-TB Gold test   . Shoulder pain   . Tear of right rotator cuff   . Type 2 diabetes mellitus ([place])    last A1c 5.4 on 04-13-2018 in epic (followed by pcp)  . Wears glasses     Past Surgical History:  Procedure Laterality Date  . ANAL FISSURE REPAIR    . ARTHOSCOPIC ROTAOR CUFF REPAIR Right 06/15/2018   Procedure: RIGHT SHOULDER ARTHROSCOPY, DEBRIDEMENT, SUBACROMIAL DECOMPRESSION, DISTAL CLAVICLE RESECTION, ROTATOR CUFF REPAIR;  Surgeon: [name], MD;  Location: [place];  Service: Orthopedics;  Laterality: Right;  . COLONOSCOPY  05-26-2017   dr Henrene Pastor  . Hip Resurface  2006  . KNEE ARTHROSCOPY W/ MENISCAL REPAIR Bilateral right 1993; left 2010  . MASS EXCISION Right 04/21/2018   Procedure: EXCISION  RIGHT THIGH SOFT TISSUE MASS;  Surgeon: [name], MD;  Location[facility];  Service: Orthopedics;  Laterality: Right;  . SEPTOPLASTY    . SHOULDER ARTHROSCOPY WITH ROTATOR CUFF REPAIR Left   . SLAP Tear Repair  2008  . TONSILLECTOMY    . 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 TOTAL KNEE ARTHROPLASTY WITH REVISION COMPONENTS Left 04/21/2018   Procedure: Left knee polyethylene exchange ;  Surgeon: [name], MD;  Location[facility];  Service: Orthopedics;  Laterality: Left;    There were no vitals filed for this visit.   Subjective Assessment - 11/08/20 1452    Subjective "I just got off working for the last week. my shoulder is abut 8/10, and the R glute/ lumbar is about 7/10."    Currently in Pain? Yes    Pain Score 8     Pain Location Neck    Pain Orientation Right    Pain Descriptors / Indicators Aching    Pain Type Chronic pain    Pain Onset More than a month ago    Pain Frequency Intermittent    Aggravating Factors  work  [facility] PT Assessment - 11/08/20 0001      Assessment   Medical Diagnosis Rt shoulder pain, LBP    Referring Provider (PT) [name], MD, [name], MD    Onset Date/Surgical Date 06/15/18      Special Tests    Special Tests Leg LengthTest    Leg length test  True;Apparent      True   Right 104.9 in.    Left  107.5 in.      Apparent   Right 113.1 in.    Left 115.4 in.                         [facility] Adult PT Treatment/Exercise - 11/08/20 0001      Lumbar Exercises: Stretches   Active Hamstring Stretch 4 reps;30 seconds   PNF contract/ relax stretch     Manual Therapy   Manual Therapy Muscle Energy Technique;Neural Stretch    Manual therapy comments skilled palpation and monitoring during TPDN    Soft tissue mobilization IASTM along R upper trap/ levator, R lumbar  parapspinalss   sub-occipital release   Neural Stretch sicatic nerve glides with knee extension and hip flexed to ~45 deg with ankle pumps    Mulligan R upper trap inhibition taping      Neck Exercises: Stretches   Upper Trapezius Stretch 2 reps;30 seconds;Right   with over pressuire   Levator Stretch 2 reps;30 seconds   with overpressure           Trigger Point Dry Needling - 11/08/20 0001    Consent Given? Yes    Education Handout Provided Previously provided    E-stim with Dry Needling Details TENS  frequency 20 and x 8 min increasing intensity PRN    Upper Trapezius Response Twitch reponse elicited;Palpable increased muscle length    Lumbar multifidi Response Twitch response elicited;Palpable increased muscle length   R               PT Education - 11/08/20 1543    Education Details Reviewed LLD assessment and provied an additional 1 layer of heel lift to promote equal hip height.    Person(s) Educated Patient    Methods Explanation;Verbal cues    Comprehension Verbalized understanding;Verbal cues required            PT Short Term Goals - 03/19/20 1717      PT SHORT TERM GOAL #1   Title Pt will be independent in postural correction    Status Achieved      PT SHORT TERM GOAL #2   Title pt will perform stretches at least 3 times daily  to be proactive about pain onset    Status Achieved             PT Long Term Goals - 10/30/20 1640      PT LONG TERM GOAL #1   Title Equal cervical rotation Rt to Lt without discomfort    Baseline Right 60 deg, left 53 deg, painful right cervical region    Time 6    Period Weeks    Status On-going    Target Date 12/11/20      PT LONG TERM GOAL #2   Title gross GHJ strength 5/5    Baseline 5/5    Time 6    Period Weeks    Status Achieved      PT LONG TERM GOAL #3   Title pt will be able to  complete a work week without onset of HA or N/T    Baseline ongoing    Time 6    Period Weeks    Status On-going    Target  Date 12/11/20      PT LONG TERM GOAL #4   Title pt will be able to carry and lift objects required for ADLs    Baseline I am still doing it but it hurts- due to weight    Time 6    Period Weeks    Status Achieved      PT LONG TERM GOAL #5   Title Pt will be independent in long term HEP/gym program for continued strengthening    Baseline requires further progression    Time 6    Period Weeks    Status On-going    Target Date 12/11/20      PT LONG TERM GOAL #6   Title pt will demo hip extension without compensation and 5/5 bil    Baseline 4/5 bilat. 10/30/20    Time 6    Period Weeks    Status On-going    Target Date 12/11/20      PT LONG TERM GOAL #7   Title pt will demo proper core activation in squating, bending and lifting activiites for support to lumbar spin    Baseline ongoing    Time 6    Period Weeks    Status On-going    Target Date 12/11/20                 Plan - 11/08/20 1543    Clinical Impression Statement pt reports continued R shoulder/ neck and R low back pain secondary to work related activities. continue TPDN utilizing E-stim for both upper trap/ lumbar multifidi followed IASTM techniuqes and  Rupper trap inhibition taping. assessed LLD andpt demonstrates a significant LLD with the RLE be shorter. working on R SIJ to address compensation and provided 1 layer of heel ift to add to his current heel lift to promote pelvic equality. end of session he noted feeling more level with standing/ walking.    PT Treatment/Interventions ADLs/Self Care Home Management;Cryotherapy;Electrical Stimulation;Ultrasound;Traction;Moist Heat;Iontophoresis 4mg /ml Dexamethasone;Therapeutic activities;Therapeutic exercise;Neuromuscular re-education;Manual techniques;Patient/family education;Passive range of motion;Dry needling;Taping;Joint Manipulations;Spinal Manipulations;Functional mobility training    PT Next Visit Plan continue dry needling trial for sub-occipitals, manual,  exercise progression, squat to lift, stretches, lumobpelvic and cervical stabilization, how was additional heel lift. R SIJ assess ant vs post rotation. lower trap and scapualr stability,    PT Home Exercise Plan [id number], self correction for Lt post illium    Consulted and Agree with Plan of Care Patient           Patient will benefit from skilled therapeutic intervention in order to improve the following deficits and impairments:  Decreased range of motion,Impaired UE functional use,Increased muscle spasms,Decreased activity tolerance,Pain,Impaired flexibility,Improper body mechanics,Decreased strength,Impaired sensation,Postural dysfunction,Decreased mobility  Visit Diagnosis: Chronic right shoulder pain  Cervicalgia  Abnormal posture  Muscle weakness (generalized)  Chronic right-sided low back pain without sciatica     Problem List Patient Active Problem List   Diagnosis Date Noted  . Right shoulder injury 06/15/2018  . S/P arthroscopy of right shoulder 06/15/2018  . 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 OA (osteoarthritis) of knee 12/08/2016  . 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 Cervical facet joint syndrome 12/17/2015  . Chronic bilateral low back pain without sciatica 12/17/2015  . Primary osteoarthritis  of left knee 12/17/2015     [facility] [address] [place], [place], [address] Phone: [phone]   Fax:  [phone]  Name: Daniel VIERNES, MD MRN: [id number] Date of Birth: 28-Jul-1961

## 2020-11-12 ENCOUNTER — Ambulatory Visit (INDEPENDENT_AMBULATORY_CARE_PROVIDER_SITE_OTHER): Payer: 59 | Admitting: Internal Medicine

## 2020-11-12 ENCOUNTER — Encounter: Payer: Self-pay | Admitting: Internal Medicine

## 2020-11-12 ENCOUNTER — Other Ambulatory Visit: Payer: Self-pay

## 2020-11-12 ENCOUNTER — Other Ambulatory Visit: Payer: Self-pay | Admitting: Internal Medicine

## 2020-11-12 VITALS — BP 118/76 | HR 87 | Ht 73.0 in | Wt 296.2 lb

## 2020-11-12 DIAGNOSIS — Z8249 Family history of ischemic heart disease and other diseases of the circulatory system: Secondary | ICD-10-CM | POA: Diagnosis not present

## 2020-11-12 DIAGNOSIS — I700 Atherosclerosis of aorta: Secondary | ICD-10-CM

## 2020-11-12 DIAGNOSIS — E785 Hyperlipidemia, unspecified: Secondary | ICD-10-CM | POA: Diagnosis not present

## 2020-11-12 DIAGNOSIS — R072 Precordial pain: Secondary | ICD-10-CM | POA: Diagnosis not present

## 2020-11-12 MED ORDER — METOPROLOL TARTRATE 50 MG PO TABS
ORAL_TABLET | ORAL | 0 refills | Status: DC
Start: 2020-11-12 — End: 2020-11-12

## 2020-11-12 MED FILL — METOPROLOL TARTRATE 50 MG T: 50 | 1 days supply | Qty: 1 | Fill #0

## 2020-11-12 NOTE — Progress Notes (Signed)
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  In addition he has a history of dyslipidemia and known aortic atherosclerosis by imaging.  Also, there is a family history of heart disease including his father, who was a smoker but had an MI in his 79s.  Recently he has noted some episodes of left central chest discomfort and left arm discomfort that seem associated.  He does have a history of neck arthritis and pain in the left wrist and thumb joint.  He has been evaluated by orthopedics for this.  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  Its not always associated with exertion or relieved by rest.  Blood pressure appears to be well controlled.  He reported his last LDL cholesterol on pravastatin was 60.  There was imaging evidence of hepatic steatosis by CT of the abdomen pelvis in August 2021.  This also demonstrated aortic atherosclerosis.  He has been working on weight loss which is a long-term issue but has not been active with exercise recently due to COVID-19 and additional hospital responsibilities.  PMHx:  Past Medical History:  Diagnosis Date  . Anxiety   . Aortic atherosclerosis ([place])   . BPH (benign prostatic hyperplasia)   . Chronic pain syndrome    neck and low back  . DDD (degenerative disc disease), cervical   . Diverticulosis   . DJD (degenerative joint disease)   . Ganglion cyst    12 mm , right posterior knee  .  Hyperlipidemia   . Internal hemorrhoids   . Migraines   . OA (osteoarthritis)    knees, hands, right shoulder  . Positive QuantiFERON-TB Gold test   . Shoulder pain   . Tear of right rotator cuff   . Type 2 diabetes mellitus ([place])    last A1c 5.4 on 04-13-2018 in epic (followed by pcp)  . Wears glasses     Past Surgical History:  Procedure Laterality Date  . ANAL FISSURE REPAIR    . ARTHOSCOPIC ROTAOR CUFF REPAIR Right 06/15/2018   Procedure: RIGHT SHOULDER ARTHROSCOPY, DEBRIDEMENT, SUBACROMIAL DECOMPRESSION, DISTAL CLAVICLE RESECTION, ROTATOR CUFF REPAIR;  Surgeon: [name], MD;  Location: [facility];  Service: Orthopedics;  Laterality: Right;  . COLONOSCOPY  05-26-2017   dr Henrene Pastor  . Hip Resurface  2006  . KNEE ARTHROSCOPY W/ MENISCAL REPAIR Bilateral right 1993; left 2010  . MASS EXCISION Right 04/21/2018   Procedure: EXCISION RIGHT THIGH SOFT TISSUE MASS;  Surgeon: [name], MD;  Location: [facility];  Service: Orthopedics;  Laterality: Right;  . SEPTOPLASTY    . SHOULDER ARTHROSCOPY WITH ROTATOR CUFF REPAIR Left   . SLAP Tear Repair  2008  . TONSILLECTOMY    . 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 TOTAL KNEE ARTHROPLASTY WITH REVISION COMPONENTS  Left 04/21/2018   Procedure: Left knee polyethylene exchange ;  Surgeon: [name], MD;  Location: [facility];  Service: Orthopedics;  Laterality: Left;    FAMHx:  Family History  Problem Relation Age of Onset  . Heart attack Father 8    SOCHx:   reports that he has never smoked. He has never used smokeless tobacco. He reports current alcohol use. He reports that he does not use drugs.  ALLERGIES:  Allergies  Allergen Reactions  . Benzoin Compound Other (See Comments)    Blisters     ROS: Pertinent items noted in HPI and remainder of comprehensive ROS otherwise  negative.  HOME MEDS: Current Outpatient Medications on File Prior to Visit  Medication Sig Dispense Refill  . baclofen (LIORESAL) 10 MG tablet Take 10 mg by mouth 3 (three) times daily. 1-2 tablets three times a day    . butalbital-acetaminophen-caffeine (FIORICET, ESGIC) 50-325-40 MG per tablet Take 1-2 tablets by mouth every 4 (four) hours as needed for headache.     . celecoxib (CELEBREX) 200 MG capsule Take 1 tablet by mouth daily.    [name] CIALIS 5 MG tablet Take 5 mg by mouth every evening.   3  . cyclobenzaprine (FLEXERIL) 10 MG tablet Take 10 mg by mouth 3 (three) times daily as needed for muscle spasms.    . diazepam (VALIUM) 5 MG tablet Take 5 mg by mouth 2 (two) times daily.    [name] dicyclomine (BENTYL) 20 MG tablet Take 1 by mouth every 4-6 hours as needed for cramping 60 tablet 3  . fluticasone (FLONASE) 50 MCG/ACT nasal spray Place 2 sprays into both nostrils daily as needed for allergies.     [name] gabapentin (NEURONTIN) 300 MG capsule Take 300 mg by mouth 3 (three) times daily.    . hydrocortisone (ANUSOL-HC) 25 MG suppository Place 1 suppository (25 mg total) rectally at bedtime. 24 suppository 4  . ketoconazole (NIZORAL) 2 % cream Apply 1 application topically 2 (two) times daily as needed for irritation.     . lidocaine (LIDODERM) 5 % Place 1 patch onto the skin daily. Remove & Discard patch within 12 hours or as directed by MD    . metFORMIN (GLUCOPHAGE) 1000 MG tablet Take 1,000 mg by mouth 2 (two) times daily with a meal.    . methocarbamol (ROBAXIN-750) 750 MG tablet Take 1 tablet (750 mg total) by mouth every 8 (eight) hours as needed for muscle spasms. 50 tablet 2  . OVER THE COUNTER MEDICATION Take 1 capsule by mouth 2 (two) times daily. Super Beta Prostate Supplement    . oxycodone (OXY-IR) 5 MG capsule Take 5 mg by mouth 3 (three) times daily.    [name] oxymetazoline (AFRIN) 0.05 % nasal spray Place 2 sprays into both nostrils 2 (two) times daily as needed for congestion.     .  pantoprazole (PROTONIX) 40 MG tablet Take 40 mg by mouth daily.    . polyethylene glycol (MIRALAX / GLYCOLAX) 17 g packet Take 17 g by mouth daily. As needed    . pravastatin (PRAVACHOL) 40 MG tablet Take 40 mg by mouth every evening.   2  . pseudoephedrine (SUDAFED) 120 MG 12 hr tablet Take 120 mg by mouth daily as needed for congestion.    . Psyllium (METAMUCIL PO) Take by mouth. 1 tablet every day    . Rimegepant Sulfate (NURTEC) 75 MG TBDP Place 75 mg under the tongue daily as needed.    . rizatriptan (MAXALT-MLT)  5 MG disintegrating tablet Take 5 mg by mouth as needed for migraine.   6  . SUMAtriptan (IMITREX) 50 MG tablet Take 50 mg by mouth every 2 (two) hours as needed for migraine or headache. May repeat in 2 hours if headache persists or recurs.    . tamsulosin (FLOMAX) 0.4 MG CAPS capsule Take 0.4 mg by mouth 2 (two) times daily.     [name] topiramate (TOPAMAX) 25 MG tablet Take 25 mg by mouth at bedtime.     . traMADol (ULTRAM) 50 MG tablet Take 50 mg by mouth 3 (three) times daily.    [name] zolpidem (AMBIEN) 10 MG tablet Take 10 mg by mouth at bedtime as needed for sleep.    [name] Hyoscyamine Sulfate SL (LEVSIN/SL) 0.125 MG SUBL Place 1 each under the tongue 4 (four) times daily as needed for up to 5 days. 30 tablet 0   No current facility-administered medications on file prior to visit.    LABS/IMAGING: No results found for this or any previous visit (from the past 48 hour(s)). No results found.  LIPID PANEL: No results found for: CHOL, TRIG, HDL, CHOLHDL, VLDL, LDLCALC, LDLDIRECT  WEIGHTS: Wt Readings from Last 3 Encounters:  11/12/20 296 lb 3.2 oz (134.4 kg)  05/31/20 298 lb (135.2 kg)  05/29/20 295 lb (133.8 kg)    VITALS: BP 118/76 (BP Location: Right Arm, Patient Position: Sitting)   Pulse 87   Ht 6\' 1"  (1.854 m)   Wt 296 lb 3.2 oz (134.4 kg)   SpO2 95%   BMI 39.08 kg/m   EXAM: General appearance: alert, no distress and moderately obese Neck: no carotid bruit, no JVD  and thyroid not enlarged, symmetric, no tenderness/mass/nodules Lungs: clear to auscultation bilaterally Heart: regular rate and rhythm Abdomen: soft, non-tender; bowel sounds normal; no masses,  no organomegaly and obese Extremities: extremities normal, atraumatic, no cyanosis or edema Pulses: 2+ and symmetric Skin: Skin color, texture, turgor normal. No rashes or lesions Neurologic: Grossly normal Psych: Pleasant  EKG: Sinus rhythm with PACs at 87- personally reviewed  ASSESSMENT: 1. Chest pain, somewhat atypical 2. Family history of premature coronary disease in his father 3. Aortic atherosclerosis 4. Hepatic steatosis 5. Mixed dyslipidemia 6. Borderline diabetes 7. Moderate obesity  PLAN: 1.   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 He noted his EKG axis has shifted, previously having shown a RBBB. Risk factors include age, dyslipidemia, family history of premature CAD in his father, obesity and metabolic syndrome (borderline diabetes and hepatic steatosis). He is also noted to have imaging evidence of aortic atherosclerosis on abdominal CT this past year.  Based on this further coronary evaluation is warranted.  Would recommend CT coronary angiography for more definitive coronary evaluation.  He will likely need 50 mg metoprolol prior to the procedure for heart rate control.  He was counseled on breath-holding and remaining still for the procedure.  We will submit for FFR if necessary.  He understands the risk, benefits and alternatives of this procedure and is willing to proceed.  I will contact him with those results afterwards.  Thanks again for the kind referral.  Daniel Casino, MD, FACC, [place] Director of the [facility]  [facility] Diplomate of the American Board of Clinical Lipidology Attending Cardiologist   Direct Dial: [phone]  Fax: [phone]  Website:  [email or web address] 11/12/2020, 1:17 PM

## 2020-11-12 NOTE — Patient Instructions (Signed)
Medication Instructions:  CONTINUE current medications  Metoprolol Tartrate - take 2 hours prior to CT test  *If you need a refill on your cardiac medications before your next appointment, please call your pharmacy*   Lab Work: BMET - prior to CT test  If you have labs (blood work) drawn today and your tests are completely normal, you will receive your results only by: [name] MyChart Message (if you have MyChart) OR . A paper copy in the mail If you have any lab test that is abnormal or we need to change your treatment, we will call you to review the results.   Testing/Procedures: Coronary CT study @ [facility]   Follow-Up: At [facility], you and your health needs are our priority.  As part of our continuing mission to provide you with exceptional heart care, we have created designated Provider Care Teams.  These Care Teams include your primary Cardiologist (physician) and Advanced Practice Providers (APPs -  Physician Assistants and Nurse Practitioners) who all work together to provide you with the care you need, when you need it.  We recommend signing up for the patient portal called "MyChart".  Sign up information is provided on this After Visit Summary.  MyChart is used to connect with patients for Virtual Visits (Telemedicine).  Patients are able to view lab/test results, encounter notes, upcoming appointments, etc.  Non-urgent messages can be sent to your provider as well.   To learn more about what you can do with MyChart, go to [email or web address].    Your next appointment:   4-6 weeks * after CT test  The format for your next appointment:   In Person  Provider:   K. Mali Hilty, MD  * OK to double book if needed   Other Instructions Your cardiac CT will be scheduled at one of the below locations:   [facility] [address] [place], [place] [address] [phone]  [place] [address] [place], [place] [address] [phone]  If scheduled at [facility], please arrive at the [facility] main entrance of [facility] 30 minutes prior to test start time. Proceed to the [facility] Radiology Department (first floor) to check-in and test prep.  If scheduled at [facility], please arrive 15 mins early for check-in and test prep.  Please follow these instructions carefully (unless otherwise directed):  Hold all erectile dysfunction medications at least 3 days (72 hrs) prior to test.  On the Night Before the Test: . Be sure to Drink plenty of water. . Do not consume any caffeinated/decaffeinated beverages or chocolate 12 hours prior to your test. . Do not take any antihistamines 12 hours prior to your test.  On the Day of the Test: . Drink plenty of water. Do not drink any water within one hour of the test. . Do not eat any food 4 hours prior to the test. . You may take your regular medications prior to the test.  . Take metoprolol (Lopressor) two hours prior to test.      After the Test: . Drink plenty of water. . After receiving IV contrast, you may experience a mild flushed feeling. This is normal. . On occasion, you may experience a mild rash up to 24 hours after the test. This is not dangerous. If this occurs, you can take Benadryl 25 mg and increase your fluid intake. . If you experience trouble breathing, this can  be serious. If it is severe call 911 IMMEDIATELY. If it is mild, please call our office. . If you take any of these medications: Glipizide/Metformin, Avandament, Glucavance, please do not take 48 hours after completing test unless otherwise instructed.   Once we have confirmed authorization from your insurance company, we will call you to set up a date and time for your test. Based on how quickly your insurance processes prior authorizations requests, please allow up to 4 weeks to be contacted for  scheduling your Cardiac CT appointment. Be advised that routine Cardiac CT appointments could be scheduled as many as 8 weeks after your provider has ordered it.  For non-scheduling related questions, please contact the cardiac imaging nurse navigator should you have any questions/concerns: Sara Wallace, Cardiac Imaging Nurse Navigator Merle Tai, Interim Cardiac Imaging Nurse Navigator [facility] Direct Office Dial: [phone]   For scheduling needs, including cancellations and rescheduling, please call Brittany, [phone].    

## 2020-11-13 LAB — BASIC METABOLIC PANEL
BUN/Creatinine Ratio: 12 (ref 10–24)
BUN: 12 mg/dL (ref 8–27)
CO2: 21 mmol/L (ref 20–29)
Calcium: 9.1 mg/dL (ref 8.6–10.2)
Chloride: 108 mmol/L — ABNORMAL HIGH (ref 96–106)
Creatinine, Ser: 0.99 mg/dL (ref 0.76–1.27)
GFR calc Af Amer: 95 mL/min/{1.73_m2} (ref >59)
GFR calc non Af Amer: 82 mL/min/{1.73_m2} (ref >59)
Glucose: 85 mg/dL (ref 65–99)
Potassium: 5 mmol/L (ref 3.5–5.2)
Sodium: 142 mmol/L (ref 134–144)

## 2020-11-20 ENCOUNTER — Telehealth (HOSPITAL_COMMUNITY): Payer: Self-pay | Admitting: *Deleted

## 2020-11-20 NOTE — Telephone Encounter (Signed)
Attempted to call patient regarding upcoming cardiac CT appointment. °Left message on voicemail with name and callback number ° °Sandra Tellefsen RN Navigator Cardiac Imaging °[facility] °[phone] Office °[phone] Cell ° °

## 2020-11-21 ENCOUNTER — Other Ambulatory Visit (HOSPITAL_COMMUNITY): Payer: Self-pay | Admitting: Internal Medicine

## 2020-11-21 ENCOUNTER — Encounter (HOSPITAL_COMMUNITY): Payer: Self-pay

## 2020-11-21 ENCOUNTER — Ambulatory Visit (HOSPITAL_COMMUNITY)
Admission: RE | Admit: 2020-11-21 | Discharge: 2020-11-21 | Disposition: A | Discharge status: Home / Self Care | Payer: 59 | Source: Ambulatory Visit | Attending: Internal Medicine | Admitting: Internal Medicine

## 2020-11-21 ENCOUNTER — Other Ambulatory Visit (HOSPITAL_COMMUNITY): Payer: Self-pay | Admitting: *Deleted

## 2020-11-21 ENCOUNTER — Other Ambulatory Visit: Payer: Self-pay

## 2020-11-21 DIAGNOSIS — Z006 Encounter for examination for normal comparison and control in clinical research program: Secondary | ICD-10-CM

## 2020-11-21 MED ORDER — METOPROLOL TARTRATE 100 MG PO TABS
ORAL_TABLET | ORAL | 0 refills | Status: DC
Start: 2020-11-21 — End: 2020-11-21

## 2020-11-21 MED FILL — METOPROLOL TARTRATE 100 MG: 100 | 1 days supply | Qty: 1 | Fill #0

## 2020-11-21 NOTE — Research (Signed)
IDENTIFY Informed Consent                  Subject Name: [name]   Subject met inclusion and exclusion criteria.  The informed consent form, study requirements and expectations were reviewed with the subject and questions and concerns were addressed prior to the signing of the consent form.  The subject verbalized understanding of the trial requirements.  The subject agreed to participate in the IDENTIFY trial and signed the informed consent at 1513PM on 11/21/20.  The informed consent was obtained prior to performance of any protocol-specific procedures for the subject.  A copy of the signed informed consent was given to the subject and a copy was placed in the subject's medical record.   Meade Maw, Naval architect

## 2020-11-21 NOTE — Progress Notes (Signed)
   11/21/20 1619  Vital Signs  Pulse Rate 68  Pulse Rate Source Monitor  Resp 14  BP 117/77  MAP (mmHg) 89  BP Location Left Arm  BP Method Automatic  Patient Position (if appropriate) Sitting  MEWS Score  MEWS Temp 0  MEWS Systolic 0  MEWS Pulse 0  MEWS RR 0  MEWS LOC 0  MEWS Score 0  MEWS Score Color Green    Patient arrived for Cardiac CT, upon review of medications, patient reported that he did take his Cialis this morning.  This RN placed call to Dr. Debara Pickett to make aware of medication dose. Dr. Debara Pickett would like to cancel this exam today and reschedule for a time where patient can hold his cialis for 72 hours.  This RN contact [facility] Cardiac Navigator to make aware of need for rescheduled appointment.  Patient aware to hold cialis medication for 72 hours prior to his rescheduled appointment. Ambulatory to lobby, discharged in stable condition.

## 2020-11-22 ENCOUNTER — Other Ambulatory Visit (HOSPITAL_COMMUNITY): Payer: Self-pay | Admitting: Internal Medicine

## 2020-11-22 MED FILL — KETOCONAZOLE 2% CREAM: 2 | 15 days supply | Qty: 30 | Fill #1

## 2020-11-22 MED FILL — METFORMIN HCL 1000 MG TABS: 1000 | 90 days supply | Qty: 180 | Fill #1

## 2020-11-22 MED FILL — LIDOCAINE PATCH 5%: 5 | 30 days supply | Qty: 60 | Fill #0

## 2020-11-22 MED FILL — METHOCARBAMOL 500 MG TABS: 500 | 90 days supply | Qty: 270 | Fill #1

## 2020-11-22 MED FILL — GABAPENTIN 300 MG CAPSULE: 300 | 90 days supply | Qty: 270 | Fill #0

## 2020-11-22 MED FILL — RIZATRIPTAN 5 MG ODT: 5 | 30 days supply | Qty: 15 | Fill #0

## 2020-11-22 MED FILL — BUTALB-ACETAMIN-CAFF 50-325: 50-325-40 | 30 days supply | Qty: 180 | Fill #2

## 2020-11-22 MED FILL — ZOLPIDEM TARTRATE 10 MG TAB: 10 | 90 days supply | Qty: 90 | Fill #1

## 2020-11-22 MED FILL — BACLOFEN 10 MG TABS: 10 | 90 days supply | Qty: 360 | Fill #1

## 2020-11-22 MED FILL — CYCLOBENZAPRINE HCL 10 MG T: 10 | 90 days supply | Qty: 270 | Fill #3

## 2020-11-22 MED FILL — HYDROCORTISONE ACETATE 25 M: 25 | 24 days supply | Qty: 24 | Fill #3

## 2020-11-22 MED FILL — DICYCLOMINE 20 MG TABLET: 20 | 10 days supply | Qty: 60 | Fill #3

## 2020-11-22 MED FILL — PRAVASTATIN NA 40 MG TAB: 40 | 90 days supply | Qty: 90 | Fill #1

## 2020-11-22 MED FILL — traMADol HCL 50 MG TABS: 50 | 30 days supply | Qty: 240 | Fill #2

## 2020-11-23 ENCOUNTER — Other Ambulatory Visit (HOSPITAL_COMMUNITY): Payer: Self-pay | Admitting: Internal Medicine

## 2020-11-23 ENCOUNTER — Telehealth (HOSPITAL_COMMUNITY): Payer: Self-pay | Admitting: Emergency Medicine

## 2020-11-23 MED FILL — oxyCODONE HCL 5 MG TABS: 5 | 30 days supply | Qty: 180 | Fill #0

## 2020-11-23 MED FILL — TOPIRAMATE 25 MG TABLET: 25 | 90 days supply | Qty: 90 | Fill #0

## 2020-11-23 NOTE — Telephone Encounter (Signed)
Reaching out to patient to offer assistance regarding upcoming cardiac imaging study; pt verbalizes understanding of appt date/time, parking situation and where to check in, pre-test NPO status and medications ordered, and verified current allergies; name and call back number provided for further questions should they arise Daniel Bond RN [place] [facility] [phone] office [phone] cell  Pt verbalized understanding to take 100mg  metoprolol tartrate 2 hr prior to scan. Holding flonase and cialis (3 days prior) . [name]

## 2020-11-26 DIAGNOSIS — M5416 Radiculopathy, lumbar region: Secondary | ICD-10-CM | POA: Diagnosis not present

## 2020-11-27 ENCOUNTER — Ambulatory Visit (HOSPITAL_COMMUNITY)
Admission: RE | Admit: 2020-11-27 | Discharge: 2020-11-27 | Disposition: A | Discharge status: Home / Self Care | Payer: 59 | Source: Ambulatory Visit | Attending: Internal Medicine | Admitting: Internal Medicine

## 2020-11-27 ENCOUNTER — Other Ambulatory Visit: Payer: Self-pay

## 2020-11-27 DIAGNOSIS — E785 Hyperlipidemia, unspecified: Secondary | ICD-10-CM | POA: Insufficient documentation

## 2020-11-27 DIAGNOSIS — Z8249 Family history of ischemic heart disease and other diseases of the circulatory system: Secondary | ICD-10-CM | POA: Diagnosis not present

## 2020-11-27 DIAGNOSIS — R072 Precordial pain: Secondary | ICD-10-CM | POA: Insufficient documentation

## 2020-11-27 MED ORDER — IOHEXOL 350 MG/ML SOLN
80.0000 mL | Freq: Once | INTRAVENOUS | Status: AC | PRN
  Administered 2020-11-27: 80 mL via INTRAVENOUS

## 2020-11-27 MED ORDER — NITROGLYCERIN 0.4 MG SL SUBL
SUBLINGUAL_TABLET | SUBLINGUAL | Status: AC
  Filled 2020-11-27: qty 2

## 2020-11-27 MED ORDER — NITROGLYCERIN 0.4 MG SL SUBL
0.8000 mg | SUBLINGUAL_TABLET | Freq: Once | SUBLINGUAL | Status: AC
  Administered 2020-11-27: 0.8 mg via SUBLINGUAL

## 2020-12-06 ENCOUNTER — Ambulatory Visit: Payer: 59 | Admitting: Physical Therapy

## 2020-12-13 ENCOUNTER — Encounter: Payer: Self-pay | Admitting: Physical Therapy

## 2020-12-13 ENCOUNTER — Other Ambulatory Visit: Payer: Self-pay

## 2020-12-13 ENCOUNTER — Ambulatory Visit: Payer: 59 | Attending: Specialist | Admitting: Physical Therapy

## 2020-12-13 DIAGNOSIS — M542 Cervicalgia: Secondary | ICD-10-CM

## 2020-12-13 DIAGNOSIS — G8929 Other chronic pain: Secondary | ICD-10-CM | POA: Diagnosis not present

## 2020-12-13 DIAGNOSIS — M25511 Pain in right shoulder: Secondary | ICD-10-CM | POA: Diagnosis not present

## 2020-12-13 DIAGNOSIS — R293 Abnormal posture: Secondary | ICD-10-CM

## 2020-12-13 DIAGNOSIS — Z96652 Presence of left artificial knee joint: Secondary | ICD-10-CM | POA: Diagnosis not present

## 2020-12-13 DIAGNOSIS — M25462 Effusion, left knee: Secondary | ICD-10-CM | POA: Diagnosis not present

## 2020-12-13 DIAGNOSIS — M545 Low back pain, unspecified: Secondary | ICD-10-CM | POA: Diagnosis not present

## 2020-12-13 DIAGNOSIS — M6281 Muscle weakness (generalized): Secondary | ICD-10-CM

## 2020-12-13 DIAGNOSIS — M25611 Stiffness of right shoulder, not elsewhere classified: Secondary | ICD-10-CM

## 2020-12-13 NOTE — Therapy (Signed)
[place], [place], [address] Phone: [phone]   Fax:  [phone]  Physical Therapy Treatment/Recertification  Patient Details  Name: Daniel REALI, [name] MRN: [id number] Date of Birth: 1960-12-25 Referring Provider (PT): [name], [name], [name], [name]   Encounter Date: 12/13/2020   PT End of Session - 12/13/20 1609    Visit Number 20    Number of Visits 31    Date for PT Re-Evaluation 01/24/21    Authorization Type UMR/[employer]    PT Start Time 7673    PT Stop Time 1550   time spent for dry needling and etsim not included in timed minutes   PT Time Calculation (min) 57 min    Activity Tolerance Patient tolerated treatment well    Behavior During Therapy [facility] for tasks assessed/performed           Past Medical History:  Diagnosis Date  . Anxiety   . Aortic atherosclerosis ([place])   . BPH (benign prostatic hyperplasia)   . Chronic pain syndrome    neck and low back  . DDD (degenerative disc disease), cervical   . Diverticulosis   . DJD (degenerative joint disease)   . Ganglion cyst    12 mm , right posterior knee  . Hyperlipidemia   . Internal hemorrhoids   . Migraines   . OA (osteoarthritis)    knees, hands, right shoulder  . Positive QuantiFERON-TB Gold test   . Shoulder pain   . Tear of right rotator cuff   . Type 2 diabetes mellitus ([place])    last A1c 5.4 on 04-13-2018 in epic (followed by pcp)  . Wears glasses     Past Surgical History:  Procedure Laterality Date  . ANAL FISSURE REPAIR    . ARTHOSCOPIC ROTAOR CUFF REPAIR Right 06/15/2018   Procedure: RIGHT SHOULDER ARTHROSCOPY, DEBRIDEMENT, SUBACROMIAL DECOMPRESSION, DISTAL CLAVICLE RESECTION, ROTATOR CUFF REPAIR;  Surgeon: [name], [name];  Location: [place];  Service: Orthopedics;  Laterality: Right;  . COLONOSCOPY  05-26-2017   dr Henrene Pastor  . Hip Resurface  2006  . KNEE ARTHROSCOPY W/ MENISCAL REPAIR  Bilateral right 1993; left 2010  . MASS EXCISION Right 04/21/2018   Procedure: EXCISION RIGHT THIGH SOFT TISSUE MASS;  Surgeon: [name], [name];  Location: [facility];  Service: Orthopedics;  Laterality: Right;  . SEPTOPLASTY    . SHOULDER ARTHROSCOPY WITH ROTATOR CUFF REPAIR Left   . SLAP Tear Repair  2008  . TONSILLECTOMY    . TOTAL KNEE ARTHROPLASTY Left 12/08/2016   Procedure: LEFT TOTAL KNEE ARTHROPLASTY;  Surgeon: [name], [name];  Location: [facility];  Service: Orthopedics;  Laterality: Left;  . TOTAL KNEE ARTHROPLASTY WITH REVISION COMPONENTS Left 04/21/2018   Procedure: Left knee polyethylene exchange ;  Surgeon: [name], [name];  Location: [facility];  Service: Orthopedics;  Laterality: Left;    There were no vitals filed for this visit.   Subjective Assessment - 12/13/20 1456    Subjective Pt. returns, not seen for therapy since 11/08/20 with last visit cancelled due to not feeling well. Current he reports neck pain 7-8/10 into right upper trapezius region as well as right lumbar pain 7/10 radiating into right gluteal region. Symptoms exacerbated with time away from therapy visits and with work activities.    Currently in Pain? Yes    Pain Score --   7-8/10   Pain Location Neck    Pain Orientation Right    Pain Descriptors /  Indicators Aching    Pain Type Chronic pain    Pain Onset More than a month ago    Pain Frequency Intermittent    Aggravating Factors  work    Pain Relieving Factors DN, stretches/exercises    Pain Score 7    Pain Location Back    Pain Orientation Right;Lower    Pain Descriptors / Indicators Aching    Pain Type Chronic pain    Pain Radiating Towards right gluteal region    Pain Onset More than a month ago    Aggravating Factors  prolonged standing and walking    Pain Relieving Factors DN, stretches              [facility] PT Assessment - 12/13/20 0001      Observation/Other Assessments   Focus on Therapeutic Outcomes (FOTO)  54% limited      AROM    Cervical Flexion 22    Cervical Extension 30    Cervical - Right Side Bend 44    Cervical - Left Side Bend 37    Cervical - Right Rotation 53    Cervical - Left Rotation 60      Strength   Overall Strength Comments Right GH strength grossly 5/5                         [facility] Adult PT Treatment/Exercise - 12/13/20 0001      Lumbar Exercises: Stretches   Other Lumbar Stretch Exercise Right glut stretch 3x30 sec      Shoulder Exercises: Standing   Other Standing Exercises HEP review-RTC Theraband, scaption, neck stretches      Manual Therapy   Soft tissue mobilization STM right lumbar paraspinals, right gluteals, right upper trapezius and rhomboid region      Neck Exercises: Stretches   Upper Trapezius Stretch Right;3 reps;30 seconds            Trigger Point Dry Needling - 12/13/20 0001    Consent Given? Yes    Education Handout Provided Previously provided    Muscles Treated Head and Neck Upper trapezius    Muscles Treated Upper Quadrant Supraspinatus;Infraspinatus    Muscles Treated Back/Hip Lumbar multifidi;Erector spinae;Gluteus maximus    Electrical Stimulation Performed with Dry Needling Yes    E-stim with Dry Needling Details TENS 2 pps x 10 minutes                PT Education - 12/13/20 1557    Education Details HEP, POC    Person(s) Educated Patient    Methods Explanation;Demonstration;Verbal cues    Comprehension Verbalized understanding            PT Short Term Goals - 03/19/20 1717      PT SHORT TERM GOAL #1   Title Pt will be independent in postural correction    Status Achieved      PT SHORT TERM GOAL #2   Title pt will perform stretches at least 3 times daily  to be proactive about pain onset    Status Achieved             PT Long Term Goals - 12/13/20 1557      PT LONG TERM GOAL #1   Title Equal cervical rotation Rt to Lt without discomfort    Baseline Right 53 deg, left 60 deg 12/13/20    Time 6    Period Weeks     Status On-going    Target  Date 01/24/21      PT LONG TERM GOAL #2   Title gross GHJ strength 5/5    Baseline 5/5    Time 6    Period Weeks    Status Achieved    Target Date 01/24/21      PT LONG TERM GOAL #3   Title pt will be able to complete a work week without onset of HA or N/T    Baseline ongoing    Time 6    Period Weeks    Status On-going    Target Date 01/24/21      PT LONG TERM GOAL #4   Title pt will be able to carry and lift objects required for ADLs    Baseline able but with pain    Time 6    Period Weeks    Status Achieved      PT LONG TERM GOAL #5   Title Pt will be independent in long term HEP/gym program for continued strengthening    Baseline requires further progression    Time 6    Period Weeks    Status On-going    Target Date 01/24/21                 Plan - 12/13/20 1602    Clinical Impression Statement Pt. returns after absence from therapy as noted in subjective with pain exacerbation for neck, shoulder and lumbar region. Status for progress impacted by limited recent ability therapy attendance-pt. has shown benefit with therapy and has made a 14% improvement from baseline status since starting therapy in FOTO score. Improved right shoulder strength with right shoulder strength goal met but continues with neck stiffness/pain as well as shoulder and lumbar pain as noted in subjective. Plan resume/continue therapy with more consistent attendance for further progress to help relieve pain and address associated functional limitations.    Personal Factors and Comorbidities Comorbidity 3+;Profession;Fitness;Time since onset of injury/illness/exacerbation    Comorbidities OA, h/o RCR, chronic pain, DDD, DJD    Examination-Activity Limitations Bathing;Reach Overhead;Caring for Others;Sleep;Carry;Lift;Bend;Stand;Squat;Stairs;Other;Sit    Examination-Participation Restrictions Occupation    Stability/Clinical Decision Making Evolving/Moderate complexity     Clinical Decision Making Moderate    Rehab Potential Good    PT Frequency 2x / week    PT Duration 6 weeks    PT Treatment/Interventions ADLs/Self Care Home Management;Cryotherapy;Electrical Stimulation;Ultrasound;Traction;Moist Heat;Iontophoresis 34m/ml Dexamethasone;Therapeutic activities;Therapeutic exercise;Neuromuscular re-education;Manual techniques;Patient/family education;Passive range of motion;Dry needling;Taping;Joint Manipulations;Spinal Manipulations;Functional mobility training    PT Next Visit Plan Continue dry needling as found beneficial, further manual therapy and progress exercises as tolerated for postural training, scapular stabilization and lumbar/core stabilization and stretches    PT Home Exercise Plan [id number], self correction for Lt post illium    Consulted and Agree with Plan of Care Patient           Patient will benefit from skilled therapeutic intervention in order to improve the following deficits and impairments:  Decreased range of motion,Impaired UE functional use,Increased muscle spasms,Decreased activity tolerance,Pain,Impaired flexibility,Improper body mechanics,Decreased strength,Impaired sensation,Postural dysfunction,Decreased mobility  Visit Diagnosis: Chronic right shoulder pain  Cervicalgia  Abnormal posture  Muscle weakness (generalized)  Chronic right-sided low back pain without sciatica  Stiffness of right shoulder, not elsewhere classified     Problem List Patient Active Problem List   Diagnosis Date Noted  . Right shoulder injury 06/15/2018  . S/P arthroscopy of right shoulder 06/15/2018  . 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 OA (osteoarthritis) of knee 12/08/2016  .  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 Cervical facet joint syndrome 12/17/2015  . Chronic bilateral low back pain without sciatica 12/17/2015  . Primary osteoarthritis of left knee 12/17/2015     Beaulah Dinning, PT, DPT 12/13/20 4:14 PM  [place], [place], [address] Phone: [phone]   Fax:  [phone]  Name: Daniel DILAURO, [name] MRN: [id number] Date of Birth: May 13, 1961

## 2020-12-17 ENCOUNTER — Ambulatory Visit: Payer: 59 | Admitting: Physical Therapy

## 2020-12-17 ENCOUNTER — Telehealth: Payer: Self-pay | Admitting: Physical Therapy

## 2020-12-17 NOTE — Telephone Encounter (Signed)
LVM regarding missed appointment today. I discussed when the next scheduled appointment is, and if he cannot make it to please call before his schedule time and we can work to reschedule it for him.   Michaella Imai PT, DPT, LAT, ATC  12/17/20  4:20 PM

## 2020-12-18 ENCOUNTER — Other Ambulatory Visit: Payer: Self-pay | Admitting: Internal Medicine

## 2020-12-18 MED FILL — BUTALB-ACETAMIN-CAFF 50-325: 50-325-40 | 30 days supply | Qty: 180 | Fill #3

## 2020-12-18 MED FILL — TADALAFIL 5 MG TABS: 5 | 90 days supply | Qty: 90 | Fill #2

## 2020-12-18 MED FILL — RIZATRIPTAN 5 MG ODT: 5 | 30 days supply | Qty: 15 | Fill #1

## 2020-12-18 MED FILL — KETOCONAZOLE 2% CREAM: 2 | 15 days supply | Qty: 30 | Fill #2

## 2020-12-18 MED FILL — TAMSULOSIN HCL 0.4 MG CAP: 0.4 | 90 days supply | Qty: 180 | Fill #1

## 2020-12-18 MED FILL — DICYCLOMINE 20 MG TABLET: 20 | 10 days supply | Qty: 60 | Fill #0

## 2020-12-18 MED FILL — CELECOXIB 200 MG CAP: 200 | 90 days supply | Qty: 90 | Fill #2

## 2020-12-18 MED FILL — HYDROCORTISONE ACETATE 25 M: 25 | 24 days supply | Qty: 24 | Fill #4

## 2020-12-18 MED FILL — LIDOCAINE PATCH 5%: 5 | 30 days supply | Qty: 60 | Fill #1

## 2020-12-20 ENCOUNTER — Ambulatory Visit: Payer: 59 | Admitting: Physical Therapy

## 2020-12-20 DIAGNOSIS — M25551 Pain in right hip: Secondary | ICD-10-CM | POA: Diagnosis not present

## 2020-12-20 DIAGNOSIS — M9903 Segmental and somatic dysfunction of lumbar region: Secondary | ICD-10-CM | POA: Diagnosis not present

## 2020-12-20 DIAGNOSIS — M542 Cervicalgia: Secondary | ICD-10-CM | POA: Diagnosis not present

## 2020-12-20 DIAGNOSIS — M5430 Sciatica, unspecified side: Secondary | ICD-10-CM | POA: Diagnosis not present

## 2020-12-21 MED FILL — traMADol HCL 50 MG TABS: 50 | 30 days supply | Qty: 240 | Fill #3

## 2020-12-27 ENCOUNTER — Encounter: Payer: Self-pay | Admitting: Physical Therapy

## 2020-12-27 ENCOUNTER — Ambulatory Visit: Payer: 59 | Admitting: Physical Therapy

## 2020-12-27 ENCOUNTER — Other Ambulatory Visit: Payer: Self-pay

## 2020-12-27 DIAGNOSIS — M25611 Stiffness of right shoulder, not elsewhere classified: Secondary | ICD-10-CM

## 2020-12-27 DIAGNOSIS — R293 Abnormal posture: Secondary | ICD-10-CM

## 2020-12-27 DIAGNOSIS — M542 Cervicalgia: Secondary | ICD-10-CM

## 2020-12-27 DIAGNOSIS — G8929 Other chronic pain: Secondary | ICD-10-CM | POA: Diagnosis not present

## 2020-12-27 DIAGNOSIS — M6281 Muscle weakness (generalized): Secondary | ICD-10-CM

## 2020-12-27 DIAGNOSIS — M545 Low back pain, unspecified: Secondary | ICD-10-CM

## 2020-12-27 DIAGNOSIS — M25511 Pain in right shoulder: Secondary | ICD-10-CM | POA: Diagnosis not present

## 2020-12-27 NOTE — Therapy (Signed)
[place], [place], [address] Phone: [phone]   Fax:  [phone]  Physical Therapy Treatment  Patient Details  Name: Daniel BUDAI, [name] MRN: [id number] Date of Birth: [birth year] Referring Provider (PT): [name], [name], [name], [name]   Encounter Date: 12/27/2020   PT End of Session - 12/27/20 1612    Visit Number 21    Number of Visits 31    Date for PT Re-Evaluation 01/24/21    Authorization Type UMR/[employer]    PT Start Time 1501    PT Stop Time 1545    PT Time Calculation (min) 44 min    Activity Tolerance Patient tolerated treatment well    Behavior During Therapy [facility] for tasks assessed/performed           Past Medical History:  Diagnosis Date  . Anxiety   . Aortic atherosclerosis ([place])   . BPH (benign prostatic hyperplasia)   . Chronic pain syndrome    neck and low back  . DDD (degenerative disc disease), cervical   . Diverticulosis   . DJD (degenerative joint disease)   . Ganglion cyst    12 mm , right posterior knee  . Hyperlipidemia   . Internal hemorrhoids   . Migraines   . OA (osteoarthritis)    knees, hands, right shoulder  . Positive QuantiFERON-TB Gold test   . Shoulder pain   . Tear of right rotator cuff   . Type 2 diabetes mellitus ([place])    last A1c 5.4 on 04-13-2018 in epic (followed by pcp)  . Wears glasses     Past Surgical History:  Procedure Laterality Date  . ANAL FISSURE REPAIR    . ARTHOSCOPIC ROTAOR CUFF REPAIR Right 06/15/2018   Procedure: RIGHT SHOULDER ARTHROSCOPY, DEBRIDEMENT, SUBACROMIAL DECOMPRESSION, DISTAL CLAVICLE RESECTION, ROTATOR CUFF REPAIR;  Surgeon: [name], [name];  Location: [place];  Service: Orthopedics;  Laterality: Right;  . COLONOSCOPY  05-26-2017   dr Henrene Pastor  . Hip Resurface  2006  . KNEE ARTHROSCOPY W/ MENISCAL REPAIR Bilateral right 1993; left 2010  . MASS EXCISION Right 04/21/2018   Procedure: EXCISION  RIGHT THIGH SOFT TISSUE MASS;  Surgeon: [name], [name];  [facility];  Service: Orthopedics;  Laterality: Right;  . SEPTOPLASTY    . SHOULDER ARTHROSCOPY WITH ROTATOR CUFF REPAIR Left   . SLAP Tear Repair  2008  . TONSILLECTOMY    . TOTAL KNEE ARTHROPLASTY Left 12/08/2016   Procedure: LEFT TOTAL KNEE ARTHROPLASTY;  Surgeon: [name], [name];  [facility];  Service: Orthopedics;  Laterality: Left;  . TOTAL KNEE ARTHROPLASTY WITH REVISION COMPONENTS Left 04/21/2018   Procedure: Left knee polyethylene exchange ;  Surgeon: [name], [name];  Location[facility];  Service: Orthopedics;  Laterality: Left;    There were no vitals filed for this visit.   Subjective Assessment - 12/27/20 1607    Subjective Pt. had to miss recent PT visit due to clinic cancellation. Primary concern today is right-sided neck and upper trapezius region pain but lumbar and right proximal gluteal region pain still ongoing. Pt. requests defer estim use today as he has noted increased soreness following use when doing dry needling-he starts 5 day stretch at work tomorrow so wishes to minimize soreness.    Currently in Pain? Yes    Pain Score --   6-7/10   Pain Location Neck    Pain Orientation Right    Pain Descriptors / Indicators Aching  Pain Type Chronic pain    Pain Radiating Towards right upper trapezius region    Pain Onset More than a month ago    Pain Frequency Intermittent    Aggravating Factors  work    Pain Relieving Factors DN, stretches/exercises                             [facility] Adult PT Treatment/Exercise - 12/27/20 0001      Manual Therapy   Manual Therapy Manual Traction    Joint Mobilization cervical UPAs mid to lower cervical region on right grade III-IV    Soft tissue mobilization STM right lumbar and upper gluteal region, brief cervical STM focus right side paraspinals    Manual Traction cervical manual traction      Neck Exercises: Stretches   Upper  Trapezius Stretch Right;3 reps;30 seconds   supine manual stretch           Trigger Point Dry Needling - 12/27/20 0001    Consent Given? Yes    Education Handout Provided Previously provided    Muscles Treated Head and Neck Upper trapezius;Cervical multifidi;Semispinalis capitus   right side   Muscles Treated Back/Hip Gluteus maximus;Erector spinae;Lumbar multifidi                  PT Short Term Goals - 03/19/20 1717      PT SHORT TERM GOAL #1   Title Pt will be independent in postural correction    Status Achieved      PT SHORT TERM GOAL #2   Title pt will perform stretches at least 3 times daily  to be proactive about pain onset    Status Achieved             PT Long Term Goals - 12/13/20 1557      PT LONG TERM GOAL #1   Title Equal cervical rotation Rt to Lt without discomfort    Baseline Right 53 deg, left 60 deg 12/13/20    Time 6    Period Weeks    Status On-going    Target Date 01/24/21      PT LONG TERM GOAL #2   Title gross GHJ strength 5/5    Baseline 5/5    Time 6    Period Weeks    Status Achieved    Target Date 01/24/21      PT LONG TERM GOAL #3   Title pt will be able to complete a work week without onset of HA or N/T    Baseline ongoing    Time 6    Period Weeks    Status On-going    Target Date 01/24/21      PT LONG TERM GOAL #4   Title pt will be able to carry and lift objects required for ADLs    Baseline able but with pain    Time 6    Period Weeks    Status Achieved      PT LONG TERM GOAL #5   Title Pt will be independent in long term HEP/gym program for continued strengthening    Baseline requires further progression    Time 6    Period Weeks    Status On-going    Target Date 01/24/21                 Plan - 12/27/20 1613    Clinical Impression Statement Continued dry needling along with manual  therapy and stretches as noted per flowsheet. Decreased pain/tension post-tx. but status impacted by time between  therapy visits with tendency symptom exacerbation with work activities-status for goals grossly consistent with last visit/progress note. Pt. is scheduled for consistent follow up visits moving forward so hopeful for more consistent symptom relief with tx.    Personal Factors and Comorbidities Comorbidity 3+;Profession;Fitness;Time since onset of injury/illness/exacerbation    Comorbidities OA, h/o RCR, chronic pain, DDD, DJD    Examination-Activity Limitations Bathing;Reach Overhead;Caring for Others;Sleep;Carry;Lift;Bend;Stand;Squat;Stairs;Other;Sit    Examination-Participation Restrictions Occupation    Stability/Clinical Decision Making Evolving/Moderate complexity    Clinical Decision Making Moderate    Rehab Potential Good    PT Frequency 2x / week    PT Duration 6 weeks    PT Treatment/Interventions ADLs/Self Care Home Management;Cryotherapy;Electrical Stimulation;Ultrasound;Traction;Moist Heat;Iontophoresis 4mg /ml Dexamethasone;Therapeutic activities;Therapeutic exercise;Neuromuscular re-education;Manual techniques;Patient/family education;Passive range of motion;Dry needling;Taping;Joint Manipulations;Spinal Manipulations;Functional mobility training    PT Next Visit Plan Continue dry needling as found beneficial, further manual therapy and progress exercises as tolerated for postural training, scapular stabilization and lumbar/core stabilization and stretches    PT Home Exercise Plan [id number], self correction for Lt post illium    Consulted and Agree with Plan of Care Patient           Patient will benefit from skilled therapeutic intervention in order to improve the following deficits and impairments:  Decreased range of motion,Impaired UE functional use,Increased muscle spasms,Decreased activity tolerance,Pain,Impaired flexibility,Improper body mechanics,Decreased strength,Impaired sensation,Postural dysfunction,Decreased mobility  Visit Diagnosis: Chronic right shoulder  pain  Cervicalgia  Abnormal posture  Chronic right-sided low back pain without sciatica  Stiffness of right shoulder, not elsewhere classified  Muscle weakness (generalized)     Problem List Patient Active Problem List   Diagnosis Date Noted  . Right shoulder injury 06/15/2018  . S/P arthroscopy of right shoulder 06/15/2018  . Failed total knee arthroplasty ([place]) 04/21/2018  . OA (osteoarthritis) of knee 12/08/2016  . 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 Cervical facet joint syndrome 12/17/2015  . Chronic bilateral low back pain without sciatica 12/17/2015  . Primary osteoarthritis of left knee 12/17/2015    Beaulah Dinning, PT, DPT 12/27/20 4:18 PM  [place], [place], [address] Phone: [phone]   Fax:  [phone]  Name: Daniel FULFORD, [name] MRN: [id number] Date of Birth: Jun 16, 1961

## 2020-12-31 ENCOUNTER — Other Ambulatory Visit (HOSPITAL_COMMUNITY): Payer: Self-pay | Admitting: Internal Medicine

## 2020-12-31 MED FILL — predniSONE 50 MG TABS: 50 | 7 days supply | Qty: 7 | Fill #0

## 2021-01-03 ENCOUNTER — Ambulatory Visit: Payer: 59 | Admitting: Physical Therapy

## 2021-01-03 ENCOUNTER — Other Ambulatory Visit: Payer: Self-pay

## 2021-01-03 DIAGNOSIS — G8929 Other chronic pain: Secondary | ICD-10-CM

## 2021-01-03 DIAGNOSIS — R293 Abnormal posture: Secondary | ICD-10-CM

## 2021-01-03 DIAGNOSIS — M542 Cervicalgia: Secondary | ICD-10-CM

## 2021-01-03 DIAGNOSIS — M25511 Pain in right shoulder: Secondary | ICD-10-CM

## 2021-01-03 DIAGNOSIS — M545 Low back pain, unspecified: Secondary | ICD-10-CM | POA: Diagnosis not present

## 2021-01-03 DIAGNOSIS — M6281 Muscle weakness (generalized): Secondary | ICD-10-CM | POA: Diagnosis not present

## 2021-01-03 DIAGNOSIS — M25611 Stiffness of right shoulder, not elsewhere classified: Secondary | ICD-10-CM

## 2021-01-03 NOTE — Therapy (Signed)
[place], [place], [address] Phone: [phone]   Fax:  [phone]  Physical Therapy Treatment  Patient Details  Name: Daniel ULIBARRI, MD MRN: [id number] Date of Birth: [birth year] Referring Provider (PT): [name], MD, [name], MD   Encounter Date: 01/03/2021   PT End of Session - 01/03/21 1540    Visit Number 22    Number of Visits 31    Date for PT Re-Evaluation 01/24/21    Authorization Type [employer]    PT Start Time 1540    PT Stop Time 1640    PT Time Calculation (min) 60 min    Activity Tolerance Patient tolerated treatment well    Behavior During Therapy [facility] for tasks assessed/performed           Past Medical History:  Diagnosis Date  . Anxiety   . Aortic atherosclerosis ([place])   . BPH (benign prostatic hyperplasia)   . Chronic pain syndrome    neck and low back  . DDD (degenerative disc disease), cervical   . Diverticulosis   . DJD (degenerative joint disease)   . Ganglion cyst    12 mm , right posterior knee  . Hyperlipidemia   . Internal hemorrhoids   . Migraines   . OA (osteoarthritis)    knees, hands, right shoulder  . Positive QuantiFERON-TB Gold test   . Shoulder pain   . Tear of right rotator cuff   . Type 2 diabetes mellitus ([place])    last A1c 5.4 on 04-13-2018 in epic (followed by pcp)  . Wears glasses     Past Surgical History:  Procedure Laterality Date  . ANAL FISSURE REPAIR    . ARTHOSCOPIC ROTAOR CUFF REPAIR Right 06/15/2018   Procedure: RIGHT SHOULDER ARTHROSCOPY, DEBRIDEMENT, SUBACROMIAL DECOMPRESSION, DISTAL CLAVICLE RESECTION, ROTATOR CUFF REPAIR;  Surgeon: [name], MD;  Location: [facility];  Service: Orthopedics;  Laterality: Right;  . COLONOSCOPY  05-26-2017   dr Henrene Pastor  . Hip Resurface  2006  . KNEE ARTHROSCOPY W/ MENISCAL REPAIR Bilateral right 1993; left 2010  . MASS EXCISION Right 04/21/2018   Procedure: EXCISION  RIGHT THIGH SOFT TISSUE MASS;  Surgeon: [name], MD;  Location: [facility];  Service: Orthopedics;  Laterality: Right;  . SEPTOPLASTY    . SHOULDER ARTHROSCOPY WITH ROTATOR CUFF REPAIR Left   . SLAP Tear Repair  2008  . TONSILLECTOMY    . 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 TOTAL KNEE ARTHROPLASTY WITH REVISION COMPONENTS Left 04/21/2018   Procedure: Left knee polyethylene exchange ;  Surgeon: [name], MD;  Location: [facility];  Service: Orthopedics;  Laterality: Left;    There were no vitals filed for this visit.   Subjective Assessment - 01/03/21 1540    Subjective "After the last treatment I had migraines for 4 straight days. The neck and back are still losening up, I think my paraspinals muslces or tenonds right under didn't really like what was done last time. I think I am getting more range of motion, I think I am getting some strength in the shoulder. I am trying to get back into the gym and start lifting weights.              [facility] PT Assessment - 01/03/21 0001      Assessment   Medical Diagnosis  Rt shoulder pain, LBP    Referring Provider (PT) [name], MD, [name], MD                         [facility] Adult PT Treatment/Exercise - 01/03/21 0001      Shoulder Exercises: Supine   Protraction Strengthening;Right   rhythmic stabilization3 x 30 sec     Shoulder Exercises: Seated   Other Seated Exercises R shoulder first rib mobs using sheet over R shoulder and under L glute pulling down with deep breathing in/ out to incorporate MWM.    Other Seated Exercises 2 x 12 with green theraband with elbows propped on table   for lower trap activation     Shoulder Exercises: Stretch   Other Shoulder Stretches upper trap stretch 2 x 30 sec      Manual Therapy   Manual therapy comments skilled palpation and  monitoring during TPDN    Joint Mobilization cervical CPA/ R UPA grade III, bil first rib mobs grade IV,    Soft tissue mobilization IASTM along the R upper trap/ levator scapuale    Mulligan R upper trap inhibition taping      Neck Exercises: Stretches   Upper Trapezius Stretch 2 reps;Right;30 seconds            Trigger Point Dry Needling - 01/03/21 0001    Consent Given? 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 updated HEP for first rib mobs.    Person(s) Educated Patient    Methods Explanation;Verbal cues;Handout    Comprehension Verbalized understanding;Verbal cues required            PT Short Term Goals - 03/19/20 1717      PT SHORT TERM GOAL #1   Title Pt will be independent in postural correction    Status Achieved      PT SHORT TERM GOAL #2   Title pt will perform stretches at least 3 times daily  to be proactive about pain onset    Status Achieved             PT Long Term Goals - 12/13/20 1557      PT LONG TERM GOAL #1   Title Equal cervical rotation Rt to Lt without discomfort    Baseline Right 53 deg, left 60 deg 12/13/20    Time 6    Period Weeks    Status On-going    Target Date 01/24/21      PT LONG TERM GOAL #2   Title gross GHJ strength 5/5    Baseline 5/5    Time 6    Period Weeks    Status Achieved    Target Date 01/24/21      PT LONG TERM GOAL #3   Title pt will be able to complete a work week without onset of HA or N/T    Baseline ongoing    Time 6    Period Weeks    Status On-going    Target Date 01/24/21  PT LONG TERM GOAL #4   Title pt will be able to  carry and lift objects required for ADLs    Baseline able but with pain    Time 6    Period Weeks    Status Achieved      PT LONG TERM GOAL #5   Title Pt will be independent in long term HEP/gym program for continued strengthening    Baseline requires further progression    Time 6    Period Weeks    Status On-going    Target Date 01/24/21                 Plan - 01/03/21 1654    Clinical Impression Statement Time taken to discuss previous treatments/ sessions  and based on multipole treatment notes the aggrivating factor is work related activities including prolonged sitting (charting). He reports he is doing some exercises and plans to resume going to the gym. Based on aggrivating factors and given the limited frequency / consistency of treatment we are doing more maintenance versus breaking new ground with functional progress, which pt agreed. continued TPDN for the R upper trap and R lumbar multifidi followed with IASTM and cervical/ rib mobs. cotninued working on scapular stability addressing serratus and lower trap to facilitate scapulohumeral rhythm. Discussed plan of care using 1 x a week for 4 weeks to focusing on specific issues, gross strength and progress HEP and work toward discharge which pt agreed.    PT Treatment/Interventions ADLs/Self Care Home Management;Cryotherapy;Electrical Stimulation;Ultrasound;Traction;Moist Heat;Iontophoresis 4mg /ml Dexamethasone;Therapeutic activities;Therapeutic exercise;Neuromuscular re-education;Manual techniques;Patient/family education;Passive range of motion;Dry needling;Taping;Joint Manipulations;Spinal Manipulations;Functional mobility training    PT Next Visit Plan only DN PRN, gross shoulder strength, did he get lumbar roll. rib mobs, scapular mobility. shoulder and core strength.    PT Home Exercise Plan [id number],    Consulted and Agree with Plan of Care Patient           Patient will benefit from skilled therapeutic intervention  in order to improve the following deficits and impairments:  Decreased range of motion,Impaired UE functional use,Increased muscle spasms,Decreased activity tolerance,Pain,Impaired flexibility,Improper body mechanics,Decreased strength,Impaired sensation,Postural dysfunction,Decreased mobility  Visit Diagnosis: Chronic right shoulder pain  Cervicalgia  Abnormal posture  Chronic right-sided low back pain without sciatica  Stiffness of right shoulder, not elsewhere classified  Muscle weakness (generalized)  Acute pain of right shoulder     Problem List Patient Active Problem List   Diagnosis Date Noted  . Right shoulder injury 06/15/2018  . S/P arthroscopy of right shoulder 06/15/2018  . Failed total knee arthroplasty ([place]) 04/21/2018  . OA (osteoarthritis) of knee 12/08/2016  . 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 Cervical facet joint syndrome 12/17/2015  . Chronic bilateral low back pain without sciatica 12/17/2015  . Primary osteoarthritis of left knee 12/17/2015   Starr Lake PT, DPT, LAT, ATC  01/03/21  5:12 PM      [place] [facility] [address] [place], [place], [address] Phone: [phone]   Fax:  [phone]  Name: KALVYN DESA, MD MRN: [id number] Date of Birth: [birth year]

## 2021-01-10 ENCOUNTER — Ambulatory Visit: Payer: 59 | Admitting: Physical Therapy

## 2021-01-17 ENCOUNTER — Other Ambulatory Visit (HOSPITAL_COMMUNITY): Payer: Self-pay

## 2021-01-18 ENCOUNTER — Other Ambulatory Visit (HOSPITAL_COMMUNITY): Payer: Self-pay

## 2021-01-21 DIAGNOSIS — M25551 Pain in right hip: Secondary | ICD-10-CM | POA: Diagnosis not present

## 2021-01-21 DIAGNOSIS — M5430 Sciatica, unspecified side: Secondary | ICD-10-CM | POA: Diagnosis not present

## 2021-01-21 DIAGNOSIS — M542 Cervicalgia: Secondary | ICD-10-CM | POA: Diagnosis not present

## 2021-01-21 DIAGNOSIS — M9903 Segmental and somatic dysfunction of lumbar region: Secondary | ICD-10-CM | POA: Diagnosis not present

## 2021-01-30 ENCOUNTER — Other Ambulatory Visit (HOSPITAL_BASED_OUTPATIENT_CLINIC_OR_DEPARTMENT_OTHER): Payer: Self-pay

## 2021-01-31 ENCOUNTER — Other Ambulatory Visit (HOSPITAL_BASED_OUTPATIENT_CLINIC_OR_DEPARTMENT_OTHER): Payer: Self-pay

## 2021-01-31 ENCOUNTER — Ambulatory Visit: Payer: 59 | Attending: Internal Medicine

## 2021-01-31 ENCOUNTER — Other Ambulatory Visit: Payer: Self-pay

## 2021-01-31 DIAGNOSIS — Z23 Encounter for immunization: Secondary | ICD-10-CM

## 2021-01-31 MED ORDER — COVID-19 MRNA VAC-TRIS(PFIZER) 30 MCG/0.3ML IM SUSP
INTRAMUSCULAR | 0 refills | Status: DC
  Filled 2021-01-31: qty 0.3, 1d supply, fill #0

## 2021-01-31 NOTE — Progress Notes (Signed)
   Covid-19 Vaccination Clinic  Name:  Daniel DOSANJH, MD    MRN: [id number] DOB: [birth year]  01/31/2021  [name] was observed post Covid-19 immunization for 15 minutes without incident. He was provided with Vaccine Information Sheet and instruction to access the V-Safe system.   [name] was instructed to call 911 with any severe reactions post vaccine: [name] Difficulty breathing  . Swelling of face and throat  . A fast heartbeat  . A bad rash all over body  . Dizziness and weakness   Immunizations Administered    Name Date Dose VIS Date Route   PFIZER Comrnaty(Gray TOP) Covid-19 Vaccine 01/31/2021  2:32 PM 0.3 mL 09/20/2020 Intramuscular   Manufacturer: [employer]   Lot: [id number]   NDC: [phone]

## 2021-02-04 ENCOUNTER — Ambulatory Visit: Payer: 59 | Attending: Specialist | Admitting: Physical Therapy

## 2021-02-04 ENCOUNTER — Encounter: Payer: Self-pay | Admitting: Physical Therapy

## 2021-02-04 ENCOUNTER — Other Ambulatory Visit: Payer: Self-pay

## 2021-02-04 DIAGNOSIS — M545 Low back pain, unspecified: Secondary | ICD-10-CM | POA: Diagnosis not present

## 2021-02-04 DIAGNOSIS — M542 Cervicalgia: Secondary | ICD-10-CM | POA: Diagnosis not present

## 2021-02-04 DIAGNOSIS — G8929 Other chronic pain: Secondary | ICD-10-CM | POA: Diagnosis not present

## 2021-02-04 DIAGNOSIS — M25511 Pain in right shoulder: Secondary | ICD-10-CM | POA: Insufficient documentation

## 2021-02-04 DIAGNOSIS — R293 Abnormal posture: Secondary | ICD-10-CM | POA: Diagnosis not present

## 2021-02-04 NOTE — Therapy (Signed)
[place], [place], [address] Phone: [phone]   Fax:  [phone]  Physical Therapy Treatment / Re-evaluation  Patient Details  Name: Daniel MCEACHIN, MD MRN: [id number] Date of Birth: 1961-03-07 Referring Provider (PT): [name], MD, [name], MD   Encounter Date: 02/04/2021   PT End of Session - 02/04/21 1233    Visit Number 23    Number of Visits 31    Date for PT Re-Evaluation 02/26/21    PT Start Time 2751    PT Stop Time 1231    PT Time Calculation (min) 43 min    Activity Tolerance Patient tolerated treatment well    Behavior During Therapy [facility] for tasks assessed/performed           Past Medical History:  Diagnosis Date  . Anxiety   . Aortic atherosclerosis ([place])   . BPH (benign prostatic hyperplasia)   . Chronic pain syndrome    neck and low back  . DDD (degenerative disc disease), cervical   . Diverticulosis   . DJD (degenerative joint disease)   . Ganglion cyst    12 mm , right posterior knee  . Hyperlipidemia   . Internal hemorrhoids   . Migraines   . OA (osteoarthritis)    knees, hands, right shoulder  . Positive QuantiFERON-TB Gold test   . Shoulder pain   . Tear of right rotator cuff   . Type 2 diabetes mellitus ([place])    last A1c 5.4 on 04-13-2018 in epic (followed by pcp)  . Wears glasses     Past Surgical History:  Procedure Laterality Date  . ANAL FISSURE REPAIR    . ARTHOSCOPIC ROTAOR CUFF REPAIR Right 06/15/2018   Procedure: RIGHT SHOULDER ARTHROSCOPY, DEBRIDEMENT, SUBACROMIAL DECOMPRESSION, DISTAL CLAVICLE RESECTION, ROTATOR CUFF REPAIR;  Surgeon: [name], MD;  Location: [facility];  Service: Orthopedics;  Laterality: Right;  . COLONOSCOPY  05-26-2017   dr Henrene Pastor  . Hip Resurface  2006  . KNEE ARTHROSCOPY W/ MENISCAL REPAIR Bilateral right 1993; left 2010  . MASS EXCISION Right 04/21/2018   Procedure: EXCISION RIGHT THIGH SOFT  TISSUE MASS;  Surgeon: [name], MD;  [facility];  Service: Orthopedics;  Laterality: Right;  . SEPTOPLASTY    . SHOULDER ARTHROSCOPY WITH ROTATOR CUFF REPAIR Left   . SLAP Tear Repair  2008  . TONSILLECTOMY    . 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 TOTAL KNEE ARTHROPLASTY WITH REVISION COMPONENTS Left 04/21/2018   Procedure: Left knee polyethylene exchange ;  Surgeon: [name], MD;  [facility];  Service: Orthopedics;  Laterality: Left;    There were no vitals filed for this visit.   Subjective Assessment - 02/04/21 1151    Subjective "I am doing okay, the neck and the back are doing about the same. I did that lumbar roll which I only used it for 1 week.    Currently in Pain? Yes    Pain Score 6     Pain Onset More than a month ago    Pain Score 5    Pain Location Back              [facility] PT Assessment - 02/04/21 0001      Assessment   Medical Diagnosis Rt shoulder pain, LBP    Referring Provider (  PT) [name], MD, [name], MD      AROM   Cervical Flexion 40    Cervical Extension 40    Cervical - Right Side Bend 22   ipsilateral   Cervical - Left Side Bend 40    Cervical - Right Rotation 65    Cervical - Left Rotation 63                         [facility] Adult PT Treatment/Exercise - 02/04/21 0001      Neck Exercises: Seated   Other Seated Exercise Snags 2 x 10      Manual Therapy   Manual therapy comments skilled palpation and monitoring during TPDN    Joint Mobilization cervical CPA/ R UPA grade III, bil first rib mobs grade IV,    Soft tissue mobilization IASTM along the R upper trap/ levator scapuale    Mulligan R upper trap inhibition taping            Trigger Point Dry Needling - 02/04/21 0001    Consent Given? Yes    Education Handout Provided Previously provided    Upper  Trapezius Response Twitch reponse elicited;Palpable increased muscle length   R   Levator Scapulae Response Twitch response elicited;Palpable increased muscle length   R   Lumbar multifidi Response Twitch response elicited;Palpable increased muscle length   R                 PT Short Term Goals - 03/19/20 1717      PT SHORT TERM GOAL #1   Title Pt will be independent in postural correction    Status Achieved      PT SHORT TERM GOAL #2   Title pt will perform stretches at least 3 times daily  to be proactive about pain onset    Status Achieved             PT Long Term Goals - 02/04/21 1154      PT LONG TERM GOAL #1   Title Equal cervical rotation Rt to Lt without discomfort    Period Weeks    Status Partially Met      PT LONG TERM GOAL #2   Title gross GHJ strength 5/5    Period Weeks    Status Achieved      PT LONG TERM GOAL #3   Title pt will be able to complete a work week without onset of HA or N/T    Baseline HA have been improving at work,    Period Weeks      PT LONG TERM GOAL #4   Title pt will be able to carry and lift objects required for ADLs    Period Weeks    Status Achieved      PT LONG TERM GOAL #5   Title Pt will be independent in long term HEP/gym program for continued strengthening    Period Weeks    Status On-going      PT LONG TERM GOAL #6   Title pt will demo hip extension without compensation and 5/5 bil    Period Weeks                 Plan - 02/04/21 1259    Clinical Impression Statement pt arrives back to PT following 1 month since his last session noting challenges with scheduling and work. continued reviewing pt function and progress toward his goals and discussed the plan  is to keep his currently scheduled visits and discharge. He is making good progress with cervicla ROM but noted limited cervical sidebending described as a hard end feel which could be facet related which causes concordant spasm in the R upper trap.   Continued TPDN focusing on the Rupper trap/ levator scapulae, and R lumbar multifidi followed with IASTM techniques and cervical/ rib mobs. reviewed Snags which he noted relief of pain/ tension.    PT Treatment/Interventions ADLs/Self Care Home Management;Cryotherapy;Electrical Stimulation;Ultrasound;Traction;Moist Heat;Iontophoresis 52m/ml Dexamethasone;Therapeutic activities;Therapeutic exercise;Neuromuscular re-education;Manual techniques;Patient/family education;Passive range of motion;Dry needling;Taping;Joint Manipulations;Spinal Manipulations;Functional mobility training    PT Next Visit Plan only DN PRN, gross shoulder strength, did he get lumbar roll. rib mobs, scapular mobility. shoulder and core strength.    Consulted and Agree with Plan of Care Patient           Patient will benefit from skilled therapeutic intervention in order to improve the following deficits and impairments:  Decreased range of motion,Impaired UE functional use,Increased muscle spasms,Decreased activity tolerance,Pain,Impaired flexibility,Improper body mechanics,Decreased strength,Impaired sensation,Postural dysfunction,Decreased mobility  Visit Diagnosis: Chronic right shoulder pain - Plan: PT plan of care cert/re-cert  Cervicalgia - Plan: PT plan of care cert/re-cert  Abnormal posture - Plan: PT plan of care cert/re-cert  Chronic right-sided low back pain without sciatica - Plan: PT plan of care cert/re-cert     Problem List Patient Active Problem List   Diagnosis Date Noted  . Right shoulder injury 06/15/2018  . S/P arthroscopy of right shoulder 06/15/2018  . Failed [place] knee [place]plasty ([place] 04/21/2018  . OA (osteoarthritis) of knee 12/08/2016  . 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 Cervical facet joint syndrome 12/17/2015  . Chronic bilateral low back pain without sciatica 12/17/2015  . Primary osteoarthritis of left knee 12/17/2015    KStarr LakePT, DPT, LAT, ATC  02/04/21  1:09 PM      [facility] [place] [address]Phone: [phone]  Fax:  [phone] Name: CCHRISTOHPER DUBE MD MRN: [id number] of Birth: 5June 06, 1962

## 2021-02-14 ENCOUNTER — Other Ambulatory Visit: Payer: Self-pay

## 2021-02-14 ENCOUNTER — Ambulatory Visit: Payer: 59 | Attending: Specialist | Admitting: Physical Therapy

## 2021-02-14 ENCOUNTER — Encounter: Payer: Self-pay | Admitting: Physical Therapy

## 2021-02-14 DIAGNOSIS — M25511 Pain in right shoulder: Secondary | ICD-10-CM | POA: Diagnosis not present

## 2021-02-14 DIAGNOSIS — G8929 Other chronic pain: Secondary | ICD-10-CM | POA: Insufficient documentation

## 2021-02-14 DIAGNOSIS — M25611 Stiffness of right shoulder, not elsewhere classified: Secondary | ICD-10-CM | POA: Diagnosis not present

## 2021-02-14 DIAGNOSIS — M542 Cervicalgia: Secondary | ICD-10-CM | POA: Insufficient documentation

## 2021-02-14 DIAGNOSIS — M6281 Muscle weakness (generalized): Secondary | ICD-10-CM | POA: Insufficient documentation

## 2021-02-14 DIAGNOSIS — M545 Low back pain, unspecified: Secondary | ICD-10-CM | POA: Diagnosis not present

## 2021-02-14 DIAGNOSIS — R293 Abnormal posture: Secondary | ICD-10-CM | POA: Insufficient documentation

## 2021-02-14 NOTE — Therapy (Signed)
[place], [place], [address] Phone: [phone]   Fax:  [phone]  Physical Therapy Treatment  Patient Details  Name: Daniel TRENTMAN, MD MRN: [id number] Date of Birth: 29-Jan-1961 Referring Provider (PT): [name], MD, [name], MD   Encounter Date: 02/14/2021   PT End of Session - 02/14/21 1545    Visit Number 24    Number of Visits 31    Date for PT Re-Evaluation 02/26/21    Authorization Type UMR/[employer]    PT Start Time 1545    PT Stop Time 1627    PT Time Calculation (min) 42 min    Activity Tolerance Patient tolerated treatment well    Behavior During Therapy [facility] for tasks assessed/performed           Past Medical History:  Diagnosis Date  . Anxiety   . Aortic atherosclerosis ([place])   . BPH (benign prostatic hyperplasia)   . Chronic pain syndrome    neck and low back  . DDD (degenerative disc disease), cervical   . Diverticulosis   . DJD (degenerative joint disease)   . Ganglion cyst    12 mm , right posterior knee  . Hyperlipidemia   . Internal hemorrhoids   . Migraines   . OA (osteoarthritis)    knees, hands, right shoulder  . Positive QuantiFERON-TB Gold test   . Shoulder pain   . Tear of right rotator cuff   . Type 2 diabetes mellitus ([place])    last A1c 5.4 on 04-13-2018 in epic (followed by pcp)  . Wears glasses     Past Surgical History:  Procedure Laterality Date  . ANAL FISSURE REPAIR    . ARTHOSCOPIC ROTAOR CUFF REPAIR Right 06/15/2018   Procedure: RIGHT SHOULDER ARTHROSCOPY, DEBRIDEMENT, SUBACROMIAL DECOMPRESSION, DISTAL CLAVICLE RESECTION, ROTATOR CUFF REPAIR;  Surgeon: [name], MD;  Location: [facility];  Service: Orthopedics;  Laterality: Right;  . COLONOSCOPY  05-26-2017   dr Henrene Pastor  . Hip Resurface  2006  . KNEE ARTHROSCOPY W/ MENISCAL REPAIR Bilateral right 1993; left 2010  . MASS EXCISION Right 04/21/2018   Procedure: EXCISION  RIGHT THIGH SOFT TISSUE MASS;  Surgeon: [name], MD;  Location: [facility];  Service: Orthopedics;  Laterality: Right;  . SEPTOPLASTY    . SHOULDER ARTHROSCOPY WITH ROTATOR CUFF REPAIR Left   . SLAP Tear Repair  2008  . TONSILLECTOMY    . 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 TOTAL KNEE ARTHROPLASTY WITH REVISION COMPONENTS Left 04/21/2018   Procedure: Left knee polyethylene exchange ;  Surgeon: [name], MD;  Location[facility];  Service: Orthopedics;  Laterality: Left;    There were no vitals filed for this visit.   Subjective Assessment - 02/14/21 1546    Subjective " I am not feeling too bad, I do try to do my exercises most days.    Currently in Pain? Yes    Pain Score 7     Pain Location Neck    Pain Orientation Right    Pain Descriptors / Indicators Aching    Pain Type Chronic pain    Pain Onset More than a month ago    Pain Frequency Intermittent    Pain Score 4    Pain Location Back    Pain Orientation Right    Pain Descriptors /  Indicators Aching    Pain Type Chronic pain    Pain Onset More than a month ago    Pain Frequency Intermittent              [facility] PT Assessment - 02/14/21 0001      Assessment   Medical Diagnosis Rt shoulder pain, LBP    Referring Provider (PT) [name], MD, [name], MD                         [facility] Adult PT Treatment/Exercise - 02/14/21 0001      Lumbar Exercises: Supine   Other Supine Lumbar Exercises hip adduction isometrics 1 x 10 holding 5 sec      Lumbar Exercises: Sidelying   Hip Abduction Right;20 reps      Manual Therapy   Manual therapy comments skilled palpation and monitoring during TPDN    Joint Mobilization cervical CPA/ R UPA grade III, bil first rib mobs grade IV,    Soft tissue mobilization IASTM along the R upper trap/ levator scapuale and R  lumbar paraspinals            Trigger Point Dry Needling - 02/14/21 0001    Consent Given? Yes    Education Handout Provided Previously provided    Upper Trapezius Response Twitch reponse elicited;Palpable increased muscle length   R only   Levator Scapulae Response Twitch response elicited;Palpable increased muscle length    Lumbar multifidi Response Twitch response elicited;Palpable increased muscle length   R               PT Education - 02/14/21 1635    Education Details reviewed HEP and dicsussed benefit of hip abductor strength and how it can effect the SIJ.    Person(s) Educated Patient    Methods Explanation    Comprehension Verbalized understanding            PT Short Term Goals - 03/19/20 1717      PT SHORT TERM GOAL #1   Title Pt will be independent in postural correction    Status Achieved      PT SHORT TERM GOAL #2   Title pt will perform stretches at least 3 times daily  to be proactive about pain onset    Status Achieved             PT Long Term Goals - 02/04/21 1154      PT LONG TERM GOAL #1   Title Equal cervical rotation Rt to Lt without discomfort    Period Weeks    Status Partially Met      PT LONG TERM GOAL #2   Title gross GHJ strength 5/5    Period Weeks    Status Achieved      PT LONG TERM GOAL #3   Title pt will be able to complete a work week without onset of HA or N/T    Baseline HA have been improving at work,    Period Weeks      PT LONG TERM GOAL #4   Title pt will be able to carry and lift objects required for ADLs    Period Weeks    Status Achieved      PT LONG TERM GOAL #5   Title Pt will be independent in long term HEP/gym program for continued strengthening    Period Weeks    Status On-going      PT LONG TERM GOAL #  6   Title pt will demo hip extension without compensation and 5/5 bil    Period Weeks                 Plan - 02/14/21 1637    Clinical Impression Statement pt reports some improvement  in the neck and back and notes he has been using his lumbar roll which inturn helps reduce abnormal shoulder positioning. continued TPDN for the R upper trap/ levator scapulae, and cervical multifidi, and R lumbar multifidi followd with IASTM techniques. continued cervical mobs to promote R gapping/ rotation. pt exhibits glute med weakness on the R with trigger points noted on the R. Working on potential inflare of the SIJ with sidelying hip abduction and adductor activation which he noted reduction in pain in the low back. continued discussion of discharging at the end of his current POC to independent exercise.    Examination-Activity Limitations Bathing;Reach Overhead;Caring for Others;Sleep;Carry;Lift;Bend;Stand;Squat;Stairs;Other;Sit    PT Treatment/Interventions ADLs/Self Care Home Management;Cryotherapy;Electrical Stimulation;Ultrasound;Traction;Moist Heat;Iontophoresis 66m/ml Dexamethasone;Therapeutic activities;Therapeutic exercise;Neuromuscular re-education;Manual techniques;Patient/family education;Passive range of motion;Dry needling;Taping;Joint Manipulations;Spinal Manipulations;Functional mobility training    PT Next Visit Plan only DN PRN, gross shoulder strength, did he get lumbar roll. rib mobs, scapular mobility. shoulder and core strength. potential inflare of R innominate    PT Home Exercise Plan [id number],    Consulted and Agree with Plan of Care Patient           Patient will benefit from skilled therapeutic intervention in order to improve the following deficits and impairments:  Decreased range of motion,Impaired UE functional use,Increased muscle spasms,Decreased activity tolerance,Pain,Impaired flexibility,Improper body mechanics,Decreased strength,Impaired sensation,Postural dysfunction,Decreased mobility  Visit Diagnosis: Chronic right shoulder pain  Cervicalgia  Abnormal posture  Chronic right-sided low back pain without sciatica  Stiffness of right shoulder, not  elsewhere classified  Muscle weakness (generalized)  Acute pain of right shoulder     Problem List Patient Active Problem List   Diagnosis Date Noted  . Right shoulder injury 06/15/2018  . S/P arthroscopy of right shoulder 06/15/2018  . [place] 04/21/[place]  . OA (osteoarthritis) of knee 12/08/2016  . 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 Cervical facet joint syndrome 12/17/2015  . Chronic bilateral low back pain without sciatica 12/17/2015  . Primary osteoarthritis of left knee 12/17/2015   KStarr LakePT, DPT, LAT, ATC  02/14/21  4:48 PM      [facility]an[facility] [address]Phone: [phone]  Fax:  [phone] Name: CKOLLYN LINGAFELTER MD MRN: [id number] of Birth: 5Oct 28, 1962

## 2021-02-21 ENCOUNTER — Other Ambulatory Visit: Payer: Self-pay

## 2021-02-21 ENCOUNTER — Encounter: Payer: Self-pay | Admitting: Physical Therapy

## 2021-02-21 ENCOUNTER — Ambulatory Visit: Payer: 59 | Admitting: Physical Therapy

## 2021-02-21 DIAGNOSIS — M542 Cervicalgia: Secondary | ICD-10-CM

## 2021-02-21 DIAGNOSIS — M6281 Muscle weakness (generalized): Secondary | ICD-10-CM

## 2021-02-21 DIAGNOSIS — M25611 Stiffness of right shoulder, not elsewhere classified: Secondary | ICD-10-CM | POA: Diagnosis not present

## 2021-02-21 DIAGNOSIS — R293 Abnormal posture: Secondary | ICD-10-CM | POA: Diagnosis not present

## 2021-02-21 DIAGNOSIS — M25511 Pain in right shoulder: Secondary | ICD-10-CM

## 2021-02-21 DIAGNOSIS — G8929 Other chronic pain: Secondary | ICD-10-CM | POA: Diagnosis not present

## 2021-02-21 DIAGNOSIS — M545 Low back pain, unspecified: Secondary | ICD-10-CM | POA: Diagnosis not present

## 2021-02-21 NOTE — Therapy (Signed)
[place], [place], [address] Phone: [phone]   Fax:  [phone]  Physical Therapy Treatment  Patient Details  Name: Daniel ONG, MD MRN: [id number] Date of Birth: 05-May-1961 Referring Provider (PT): [name], MD, [name], MD   Encounter Date: 02/21/2021   PT End of Session - 02/21/21 1642    Visit Number 25    Number of Visits 31    Date for PT Re-Evaluation 02/26/21    Authorization Type UMR/[employer]    PT Start Time 1545    PT Stop Time 1626    PT Time Calculation (min) 41 min    Activity Tolerance Patient tolerated treatment well    Behavior During Therapy [facility] for tasks assessed/performed           Past Medical History:  Diagnosis Date  . Anxiety   . Aortic atherosclerosis ([place])   . BPH (benign prostatic hyperplasia)   . Chronic pain syndrome    neck and low back  . DDD (degenerative disc disease), cervical   . Diverticulosis   . DJD (degenerative joint disease)   . Ganglion cyst    12 mm , right posterior knee  . Hyperlipidemia   . Internal hemorrhoids   . Migraines   . OA (osteoarthritis)    knees, hands, right shoulder  . Positive QuantiFERON-TB Gold test   . Shoulder pain   . Tear of right rotator cuff   . Type 2 diabetes mellitus ([place])    last A1c 5.4 on 04-13-2018 in epic (followed by pcp)  . Wears glasses     Past Surgical History:  Procedure Laterality Date  . ANAL FISSURE REPAIR    . ARTHOSCOPIC ROTAOR CUFF REPAIR Right 06/15/2018   Procedure: RIGHT SHOULDER ARTHROSCOPY, DEBRIDEMENT, SUBACROMIAL DECOMPRESSION, DISTAL CLAVICLE RESECTION, ROTATOR CUFF REPAIR;  Surgeon: [name], MD;  Location: [facility];  Service: Orthopedics;  Laterality: Right;  . COLONOSCOPY  05-26-2017   dr Henrene Pastor  . Hip Resurface  2006  . KNEE ARTHROSCOPY W/ MENISCAL REPAIR Bilateral right 1993; left 2010  . MASS EXCISION Right 04/21/2018   Procedure: EXCISION  RIGHT THIGH SOFT TISSUE MASS;  Surgeon: [name], MD;  [facility];  Service: Orthopedics;  Laterality: Right;  . SEPTOPLASTY    . SHOULDER ARTHROSCOPY WITH ROTATOR CUFF REPAIR Left   . SLAP Tear Repair  2008  . TONSILLECTOMY    . 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 TOTAL KNEE ARTHROPLASTY WITH REVISION COMPONENTS Left 04/21/2018   Procedure: Left knee polyethylene exchange ;  Surgeon: [name], MD;  [facility];  Service: Orthopedics;  Laterality: Left;    There were no vitals filed for this visit.   Subjective Assessment - 02/21/21 1541    Subjective " My L foot is having more issue with plantar fasciitis, the neck is about a 6/10. the back is about 3-4/10 which isn't as bad."    Currently in Pain? Yes    Pain Score 6     Pain Location Neck    Pain Orientation Right    Pain Descriptors / Indicators Aching    Pain Type Chronic pain    Pain Onset More than a month ago              [facility] PT Assessment - 02/21/21 0001  Assessment   Medical Diagnosis Rt shoulder pain, LBP    Referring Provider (PT) [name], MD, [name], MD                         [facility] Adult PT Treatment/Exercise - 02/21/21 0001      Lumbar Exercises: Supine   Bridge 10 reps   x 2 sets with sustained hip abductions , 2nd set cues to keep back flat and tighten core   Other Supine Lumbar Exercises Clamshell 2 x 15 with blue band      Manual Therapy   Manual therapy comments skilled palpation and monitoring during TPDN    Joint Mobilization cervical CPA/ R UPA grade III, bil first rib mobs grade IV,    Soft tissue mobilization IASTM along the R upper trap/ levator scapuale and R lumbar paraspinals    [place] R upper trap inhibition taping            Trigger Point Dry Needling - 02/21/21 0001    Consent Given? Yes     Education Handout Provided Previously provided    Upper Trapezius Response Twitch reponse elicited;Palpable increased muscle length    Levator Scapulae Response Twitch response elicited;Palpable increased muscle length   R   Lumbar multifidi Response Twitch response elicited;Palpable increased muscle length                  PT Short Term Goals - 03/19/20 1717      PT SHORT TERM GOAL #1   Title Pt will be independent in postural correction    Status Achieved      PT SHORT TERM GOAL #2   Title pt will perform stretches at least 3 times daily  to be proactive about pain onset    Status Achieved             PT Long Term Goals - 02/04/21 1154      PT LONG TERM GOAL #1   Title Equal cervical rotation Rt to Lt without discomfort    Period Weeks    Status Partially Met      PT LONG TERM GOAL #2   Title gross GHJ strength 5/5    Period Weeks    Status Achieved      PT LONG TERM GOAL #3   Title pt will be able to complete a work week without onset of HA or N/T    Baseline HA have been improving at work,    Period Weeks      PT LONG TERM GOAL #4   Title pt will be able to carry and lift objects required for ADLs    Period Weeks    Status Achieved      PT LONG TERM GOAL #5   Title Pt will be independent in long term HEP/gym program for continued strengthening    Period Weeks    Status On-going      PT LONG TERM GOAL #6   Title pt will demo hip extension without compensation and 5/5 bil    Period Weeks                 Plan - 02/21/21 1637    Clinical Impression Statement pt notes the low back is feeling better rating pain at 3/10 today, and 6/10 in the neck. Continued TPDN followed with IASTM techniques and lower thoracic mobilizations. continued focus on glute strengthenig in supine progressing exercise each set. continued working  on lower trap strengthening. he noted feeling sore following session noting it was a good sore.    PT Treatment/Interventions  ADLs/Self Care Home Management;Cryotherapy;Electrical Stimulation;Ultrasound;Traction;Moist Heat;Iontophoresis 69m/ml Dexamethasone;Therapeutic activities;Therapeutic exercise;Neuromuscular re-education;Manual techniques;Patient/family education;Passive range of motion;Dry needling;Taping;Joint Manipulations;Spinal Manipulations;Functional mobility training    PT Next Visit Plan only DN PRN, gross shoulder strength, did he get lumbar roll. rib mobs, scapular mobility. shoulder and core strength. potential inflare of R innominate    Consulted and Agree with Plan of Care Patient           Patient will benefit from skilled therapeutic intervention in order to improve the following deficits and impairments:  Decreased range of motion,Impaired UE functional use,Increased muscle spasms,Decreased activity tolerance,Pain,Impaired flexibility,Improper body mechanics,Decreased strength,Impaired sensation,Postural dysfunction,Decreased mobility  Visit Diagnosis: Chronic right shoulder pain  Cervicalgia  Abnormal posture  Chronic right-sided low back pain without sciatica  Stiffness of right shoulder, not elsewhere classified  Muscle weakness (generalized)  Acute pain of right shoulder     Problem List Patient Active Problem List   Diagnosis Date Noted  . Right shoulder injury 06/15/2018  . S/P arthroscopy of right shoulder 06/15/2018  . [place] 04/[place]  . OA (osteoarthritis) of knee 12/08/2016  . 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 Cervical facet joint syndrome 12/17/2015  . Chronic bilateral low back pain without sciatica 12/17/2015  . Primary osteoarthritis of left knee 12/17/2015    KStarr LakePT, DPT, LAT, ATC  02/21/21  4:47 PM      CHoquiamCSt. [facility] [address]Phone: [phone]  Fax:  [phone] Name:  CKERWIN AUGUSTUS MD MRN: [id number] of Birth: 501-Feb-1962

## 2021-02-26 ENCOUNTER — Other Ambulatory Visit: Payer: Self-pay

## 2021-02-26 ENCOUNTER — Encounter: Payer: Self-pay | Admitting: Physical Therapy

## 2021-02-26 ENCOUNTER — Ambulatory Visit: Payer: 59 | Admitting: Physical Therapy

## 2021-02-26 DIAGNOSIS — G8929 Other chronic pain: Secondary | ICD-10-CM

## 2021-02-26 DIAGNOSIS — M25611 Stiffness of right shoulder, not elsewhere classified: Secondary | ICD-10-CM

## 2021-02-26 DIAGNOSIS — R293 Abnormal posture: Secondary | ICD-10-CM

## 2021-02-26 DIAGNOSIS — M6281 Muscle weakness (generalized): Secondary | ICD-10-CM | POA: Diagnosis not present

## 2021-02-26 DIAGNOSIS — M25511 Pain in right shoulder: Secondary | ICD-10-CM | POA: Diagnosis not present

## 2021-02-26 DIAGNOSIS — M542 Cervicalgia: Secondary | ICD-10-CM | POA: Diagnosis not present

## 2021-02-26 DIAGNOSIS — M545 Low back pain, unspecified: Secondary | ICD-10-CM | POA: Diagnosis not present

## 2021-02-26 NOTE — Therapy (Signed)
[place], [place], [address] Phone: [phone]   Fax:  [phone]  Physical Therapy Treatment / Discharge  Patient Details  Name: Daniel DISANO, [name] MRN: [id number] Date of Birth: 18-Oct-1960 Referring Provider (PT): [name], [name], [name], [name]   Encounter Date: 02/26/2021   PT End of Session - 02/26/21 0223    Visit Number 26    Number of Visits 31    Date for PT Re-Evaluation 02/26/21    Authorization Type [employer]    PT Start Time 1544    PT Stop Time 1634    PT Time Calculation (min) 50 min    Activity Tolerance Patient tolerated treatment well    Behavior During Therapy [facility] for tasks assessed/performed           Past Medical History:  Diagnosis Date  . Anxiety   . Aortic atherosclerosis ([place])   . BPH (benign prostatic hyperplasia)   . Chronic pain syndrome    neck and low back  . DDD (degenerative disc disease), cervical   . Diverticulosis   . DJD (degenerative joint disease)   . Ganglion cyst    12 mm , right posterior knee  . Hyperlipidemia   . Internal hemorrhoids   . Migraines   . OA (osteoarthritis)    knees, hands, right shoulder  . Positive QuantiFERON-TB Gold test   . Shoulder pain   . Tear of right rotator cuff   . Type 2 diabetes mellitus ([place])    last A1c 5.4 on 04-13-2018 in epic (followed by pcp)  . Wears glasses     Past Surgical History:  Procedure Laterality Date  . ANAL FISSURE REPAIR    . ARTHOSCOPIC ROTAOR CUFF REPAIR Right 06/15/2018   Procedure: RIGHT SHOULDER ARTHROSCOPY, DEBRIDEMENT, SUBACROMIAL DECOMPRESSION, DISTAL CLAVICLE RESECTION, ROTATOR CUFF REPAIR;  Surgeon: [name], [name];  Location: [place];  Service: Orthopedics;  Laterality: Right;  . COLONOSCOPY  05-26-2017   dr Henrene Pastor  . Hip Resurface  2006  . KNEE ARTHROSCOPY W/ MENISCAL REPAIR Bilateral right 1993; left 2010  . MASS EXCISION Right 04/21/2018    Procedure: EXCISION RIGHT THIGH SOFT TISSUE MASS;  Surgeon: [name], [name];  Location: [facility];  Service: Orthopedics;  Laterality: Right;  . SEPTOPLASTY    . SHOULDER ARTHROSCOPY WITH ROTATOR CUFF REPAIR Left   . SLAP Tear Repair  2008  . TONSILLECTOMY    . TOTAL KNEE ARTHROPLASTY Left 12/08/2016   Procedure: LEFT TOTAL KNEE ARTHROPLASTY;  Surgeon: [name], [name];  Location: [facility];  Service: Orthopedics;  Laterality: Left;  . TOTAL KNEE ARTHROPLASTY WITH REVISION COMPONENTS Left 04/21/2018   Procedure: Left knee polyethylene exchange ;  Surgeon: [name], [name];  Location: [facility];  Service: Orthopedics;  Laterality: Left;    There were no vitals filed for this visit.   Subjective Assessment - 02/26/21 1539    Subjective "I am not doing too bad, still stiff in the neck more in the neck than the back. the exercises are going pretty good."    Currently in Pain? Yes    Pain Score 5     Pain Location Neck    Pain Orientation Right    Pain Score 3    Pain Location Back    Pain Orientation Right    Pain Type Chronic pain    Pain Onset More than a month ago    Pain Frequency Intermittent  [facility] PT Assessment - 02/26/21 0001      Assessment   Medical Diagnosis Rt shoulder pain, LBP    Referring Provider (PT) [name], [name], [name], [name]      Observation/Other Assessments   Focus on Therapeutic Outcomes (FOTO)  56% limited                         [place] [facility] PT Treatment/Exercise - 02/26/21 0001      Shoulder Exercises: Seated   Other Seated Exercises thoracic extension over chair with elbows adducted 2 x 10      Shoulder Exercises: Standing   Other Standing Exercises PNF D2 R only 1 x 10 with black band.      Manual Therapy   Manual therapy comments skilled palpation and monitoring during TPDN    Joint Mobilization cervical CPA/ R UPA grade III, bil first rib mobs grade IV,    Soft tissue mobilization IASTM along the R upper  trap/ levator scapuale and R lumbar paraspinals    Mulligan R upper trap inhibition taping                  PT Education - 02/26/21 1625    Education Details Extensively reviewed HEP and discussed proper progression of strengthening,    Person(s) Educated Patient    Methods Explanation;Verbal cues;Handout    Comprehension Verbalized understanding;Verbal cues required            PT Short Term Goals - 03/19/20 1717      PT SHORT TERM GOAL #1   Title Pt will be independent in postural correction    Status Achieved      PT SHORT TERM GOAL #2   Title pt will perform stretches at least 3 times daily  to be proactive about pain onset    Status Achieved             PT Long Term Goals - 02/26/21 1703      PT LONG TERM GOAL #1   Title Equal cervical rotation Rt to Lt without discomfort    Period Weeks    Status Partially Met      PT LONG TERM GOAL #2   Title gross GHJ strength 5/5    Period Weeks    Status Achieved      PT LONG TERM GOAL #3   Title pt will be able to complete a work week without onset of HA or N/T    Period Weeks    Status Partially Met      PT LONG TERM GOAL #4   Title pt will be able to carry and lift objects required for ADLs    Period Weeks    Status Achieved      PT LONG TERM GOAL #5   Title Pt will be independent in long term HEP/gym program for continued strengthening    Period Weeks    Status Achieved      PT LONG TERM GOAL #6   Title pt will demo hip extension without compensation and 5/5 bil    Period Weeks    Status Partially Met      PT LONG TERM GOAL #7   Title pt will demo proper core activation in squating, bending and lifting activiites for support to lumbar spin    Period Weeks    Status Partially Met  Plan - 02/26/21 1626    Clinical Impression Statement today marks his 26 visit since he was evaluated in April of 2021.  pt has made good progress with physical therapy, but does report  fluctuating pain/ stiffness in the R upper trap/ lumbar parapspinals. his FOTO score indicated fluctuating score ending at 56% function. Continued TPDN for the Rupper trap/ R lumbar paraspinals followed with IASTM. extensively reviewed HEp and discussed progression of strengthening. Addressed pt specific questions. At this point he is able to maintain and progress his current LOF IND and will be discharged from PT.    PT Next Visit Plan discharge today    PT Home Exercise Plan [id number],           Patient will benefit from skilled therapeutic intervention in order to improve the following deficits and impairments:  Decreased range of motion,Impaired UE functional use,Increased muscle spasms,Decreased activity tolerance,Pain,Impaired flexibility,Improper body mechanics,Decreased strength,Impaired sensation,Postural dysfunction,Decreased mobility  Visit Diagnosis: Chronic right shoulder pain  Cervicalgia  Abnormal posture  Chronic right-sided low back pain without sciatica  Stiffness of right shoulder, not elsewhere classified  Muscle weakness (generalized)  Acute pain of right shoulder     Problem List Patient Active Problem List   Diagnosis Date Noted  . Right shoulder injury 06/15/2018  . S/P arthroscopy of right shoulder 06/15/2018  . Failed total knee arthroplasty ([place]) 04/21/2018  . OA (osteoarthritis) of knee 12/08/2016  . 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 Cervical facet joint syndrome 12/17/2015  . Chronic bilateral low back pain without sciatica 12/17/2015  . Primary osteoarthritis of left knee 12/17/2015    [place] 02/26/2021, 5:05 PM  [facility] [address] [place], [place], [address] Phone: [phone]   Fax:  [phone]  Name: Daniel COVIN, [name] MRN: [id number] Date of Birth: January 02, 1961      PHYSICAL THERAPY DISCHARGE SUMMARY  Visits  from Start of Care: 26  Current functional level related to goals / functional outcomes: See goals, FOTO 56%    Remaining deficits: See assessment   Education / Equipment: HEP, theraband, posture lifting mechanics,   Plan: Patient agrees to discharge.  Patient goals were partially met. Patient is being discharged due to being pleased with the current functional level.  ?????        Zaniya Mcaulay PT, DPT, LAT, ATC  02/26/21  5:07 PM

## 2021-02-26 NOTE — Patient Instructions (Signed)
Access Code: [email or web address] URL: [email or web address] Date: 02/26/2021 Prepared by: [place]  Exercises Seated Upper Trapezius Stretch - 3 x daily - 7 x weekly - 2 sets - 1 reps - 20s hold Gentle Levator Scapulae Stretch - 3 x daily - 7 x weekly - 2 sets - 1 reps - 20s hold Shoulder External Rotation and Scapular Retraction with Resistance - 3 x daily - 7 x weekly - 2 sets - 20 reps Seated Thoracic Lumbar Extension with Pectoralis Stretch - 3 x daily - 7 x weekly - 5 sets - 1 reps Standing Single Arm Shoulder External Rotation in Abduction with Anchored Resistance - 1 x daily - 7 x weekly - 3 sets - 10 reps Shoulder Overhead Press in Abduction with Dumbbells - 1 x daily - 7 x weekly - 3 sets - 10 reps Tall Kneeling Shoulder Extensions with Anchored Resistance - 1 x daily - 7 x weekly - 3 sets - 10 reps Standing Row with Resistance with Anchored Resistance at Chest Height Palms Down - 1 x daily - 7 x weekly - 3 sets - 10 reps Plyometric Pushups - 1 x daily - 7 x weekly - 3 sets - 10 reps Low Trap Setting at Glandorf - 1 x daily - 7 x weekly - 3 sets - 10 reps Supine Posterior Pelvic Tilt - 1 x daily - 7 x weekly - 5 reps - 10s hold Supine March with Posterior Pelvic Tilt - 1 x daily - 7 x weekly - 3 sets - 10 reps Supine Bridge with Mini Swiss Ball Between Knees - 1 x daily - 7 x weekly - 3 sets - 10 reps Supine Dead Bug with Leg Extension - 1 x daily - 7 x weekly - 3 sets - 10 reps Supine 90/90 Abdominal Bracing - 1 x daily - 7 x weekly - 3 sets - 10 reps Sidelying Hip Flexion - 1 x daily - 7 x weekly - 3 sets - 10 reps Supine Double Leg Lift - 1 x daily - 7 x weekly - 3 sets - 10 reps Sidelying Hip Abduction - 1 x daily - 7 x weekly - 3 sets - 20 reps Alternating Single Leg Bridge - 1 x daily - 7 x weekly - 2 sets - 20 reps Shoulder PNF D2 with Resistance - 1 x daily - 7 x weekly - 3 sets - 12 reps - 3sec hold Walking Push Up - 1 x daily - 7 x weekly - 2 sets - 20 reps

## 2021-03-08 ENCOUNTER — Telehealth: Payer: Self-pay

## 2021-03-08 ENCOUNTER — Other Ambulatory Visit (HOSPITAL_COMMUNITY): Payer: Self-pay

## 2021-03-08 DIAGNOSIS — Z006 Encounter for examination for normal comparison and control in clinical research program: Secondary | ICD-10-CM

## 2021-03-08 MED ORDER — PREDNISONE 50 MG PO TABS
50.0000 mg | ORAL_TABLET | Freq: Every day | ORAL | 0 refills | Status: AC
  Filled 2021-03-08: qty 7, 7d supply, fill #0

## 2021-03-08 NOTE — Telephone Encounter (Signed)
Called patient for 90 day Identify phone call  I sent an e-mail stating the intent of the phone call and our call back number to be reached in our department.

## 2021-03-20 ENCOUNTER — Encounter: Payer: Self-pay | Admitting: *Deleted

## 2021-03-20 DIAGNOSIS — Z006 Encounter for examination for normal comparison and control in clinical research program: Secondary | ICD-10-CM

## 2021-03-20 NOTE — Progress Notes (Addendum)
IDENTIFY 54 DAY PHONE CALL  Patient returned call for 90day phone call. Says he still has the same pains at time but no other problems and has spoken to Dr. Debara Pickett. Will call him in February for final call for the study.    [employer]   03-20-2021 12:20 p.m.

## 2021-03-20 NOTE — Progress Notes (Unsigned)
IDENTIFY 1 DAY PHONE CALL  Patient returned call for 90day phone call. Says he still has the same pains at time but no other problems and has spoken to Dr. Debara Pickett. Will call him in February for final call for the study.    [employer] [employer]   03-20-2021 12:20 p.m.

## 2021-03-20 NOTE — Research (Signed)
Patient called back for 90 day phone call for Identify study. States he is doing well over all and not really having the symptoms that bought him in for cta. Has spoken to Dr. Debara Pickett. We will call him again in February for his last and final phone call for the study.      [name], Research Assistant   03/20/2021  12:20 pm.

## 2021-03-26 ENCOUNTER — Other Ambulatory Visit: Payer: Self-pay | Admitting: Internal Medicine

## 2021-03-26 ENCOUNTER — Other Ambulatory Visit (HOSPITAL_COMMUNITY): Payer: Self-pay

## 2021-03-26 DIAGNOSIS — Z20828 Contact with and (suspected) exposure to other viral communicable diseases: Secondary | ICD-10-CM | POA: Diagnosis not present

## 2021-03-27 LAB — SARS CORONAVIRUS 2 (TAT 6-24 HRS): SARS Coronavirus 2: NEGATIVE

## 2021-03-29 ENCOUNTER — Other Ambulatory Visit (HOSPITAL_COMMUNITY): Payer: Self-pay

## 2021-03-29 MED ORDER — CARESTART COVID-19 HOME TEST VI KIT
PACK | 0 refills | Status: DC
  Filled 2021-03-29: qty 4, 4d supply, fill #0

## 2021-04-05 ENCOUNTER — Other Ambulatory Visit (HOSPITAL_COMMUNITY): Payer: Self-pay

## 2021-04-05 MED FILL — Diazepam Tab 5 MG: ORAL | 90 days supply | Qty: 180 | Fill #0 | Status: AC

## 2021-04-05 MED FILL — Topiramate Tab 25 MG: ORAL | 90 days supply | Qty: 90 | Fill #0 | Status: AC

## 2021-04-05 MED FILL — Lidocaine Patch 5%: CUTANEOUS | 30 days supply | Qty: 60 | Fill #0 | Status: AC

## 2021-04-05 MED FILL — Baclofen Tab 10 MG: ORAL | 90 days supply | Qty: 360 | Fill #0 | Status: AC

## 2021-04-05 MED FILL — Ketoconazole Cream 2%: CUTANEOUS | 30 days supply | Qty: 30 | Fill #0 | Status: AC

## 2021-04-05 MED FILL — Gabapentin Cap 300 MG: ORAL | 90 days supply | Qty: 270 | Fill #0 | Status: AC

## 2021-04-05 MED FILL — Pravastatin Sodium Tab 40 MG: ORAL | 90 days supply | Qty: 90 | Fill #0 | Status: AC

## 2021-04-05 MED FILL — Methocarbamol Tab 500 MG: ORAL | 90 days supply | Qty: 270 | Fill #0 | Status: AC

## 2021-04-05 MED FILL — Metformin HCl Tab 1000 MG: ORAL | 90 days supply | Qty: 180 | Fill #0 | Status: AC

## 2021-04-05 MED FILL — Oxycodone HCl Tab 5 MG: ORAL | 30 days supply | Qty: 180 | Fill #0 | Status: AC

## 2021-04-05 MED FILL — Tadalafil Tab 5 MG: ORAL | 90 days supply | Qty: 90 | Fill #0 | Status: AC

## 2021-04-05 MED FILL — Rizatriptan Benzoate Oral Disintegrating Tab 5 MG (Base Eq): ORAL | 30 days supply | Qty: 15 | Fill #0 | Status: AC

## 2021-04-05 MED FILL — Tamsulosin HCl Cap 0.4 MG: ORAL | 90 days supply | Qty: 180 | Fill #0 | Status: AC

## 2021-04-05 MED FILL — Dicyclomine HCl Tab 20 MG: ORAL | 15 days supply | Qty: 60 | Fill #0 | Status: AC

## 2021-04-05 MED FILL — Celecoxib Cap 200 MG: ORAL | 90 days supply | Qty: 90 | Fill #0 | Status: AC

## 2021-04-05 MED FILL — Pantoprazole Sodium EC Tab 40 MG (Base Equiv): ORAL | 90 days supply | Qty: 90 | Fill #0 | Status: AC

## 2021-04-08 ENCOUNTER — Other Ambulatory Visit (HOSPITAL_COMMUNITY): Payer: Self-pay

## 2021-04-08 MED ORDER — ZOLPIDEM TARTRATE 10 MG PO TABS
10.0000 mg | ORAL_TABLET | Freq: Every day | ORAL | 1 refills | Status: DC
  Filled 2021-04-08: qty 90, 90d supply, fill #0
  Filled 2021-07-01 – 2021-07-05 (×2): qty 90, 90d supply, fill #1

## 2021-04-08 MED ORDER — TRAMADOL HCL 50 MG PO TABS
50.0000 mg | ORAL_TABLET | Freq: Four times a day (QID) | ORAL | 5 refills | Status: DC | PRN
  Filled 2021-04-08: qty 240, 30d supply, fill #0
  Filled 2021-07-01: qty 240, 30d supply, fill #1
  Filled 2021-09-02: qty 240, 30d supply, fill #2
  Filled 2021-09-30: qty 240, 30d supply, fill #3

## 2021-04-09 ENCOUNTER — Other Ambulatory Visit (HOSPITAL_COMMUNITY): Payer: Self-pay

## 2021-04-09 DIAGNOSIS — M9903 Segmental and somatic dysfunction of lumbar region: Secondary | ICD-10-CM | POA: Diagnosis not present

## 2021-04-09 DIAGNOSIS — M5430 Sciatica, unspecified side: Secondary | ICD-10-CM | POA: Diagnosis not present

## 2021-04-09 DIAGNOSIS — M25551 Pain in right hip: Secondary | ICD-10-CM | POA: Diagnosis not present

## 2021-04-09 DIAGNOSIS — M542 Cervicalgia: Secondary | ICD-10-CM | POA: Diagnosis not present

## 2021-04-11 DIAGNOSIS — M722 Plantar fascial fibromatosis: Secondary | ICD-10-CM | POA: Diagnosis not present

## 2021-04-11 DIAGNOSIS — M67962 Unspecified disorder of synovium and tendon, left lower leg: Secondary | ICD-10-CM | POA: Diagnosis not present

## 2021-04-11 DIAGNOSIS — M2170 Unequal limb length (acquired), unspecified site: Secondary | ICD-10-CM | POA: Diagnosis not present

## 2021-04-11 DIAGNOSIS — M25572 Pain in left ankle and joints of left foot: Secondary | ICD-10-CM | POA: Diagnosis not present

## 2021-04-11 DIAGNOSIS — M79672 Pain in left foot: Secondary | ICD-10-CM | POA: Diagnosis not present

## 2021-04-17 ENCOUNTER — Other Ambulatory Visit (HOSPITAL_COMMUNITY): Payer: Self-pay

## 2021-04-19 ENCOUNTER — Other Ambulatory Visit (HOSPITAL_COMMUNITY): Payer: Self-pay

## 2021-04-19 MED ORDER — COMBIVENT RESPIMAT 20-100 MCG/ACT IN AERS
2.0000 | INHALATION_SPRAY | Freq: Four times a day (QID) | RESPIRATORY_TRACT | 0 refills | Status: DC | PRN
  Filled 2021-04-19: qty 4, 20d supply, fill #0

## 2021-04-19 MED ORDER — PREDNISONE 10 MG PO TABS
ORAL_TABLET | ORAL | 0 refills | Status: DC
  Filled 2021-04-19: qty 19, 9d supply, fill #0

## 2021-04-19 MED ORDER — AMOXICILLIN-POT CLAVULANATE 875-125 MG PO TABS
1.0000 | ORAL_TABLET | Freq: Two times a day (BID) | ORAL | 0 refills | Status: AC
  Filled 2021-04-19: qty 14, 7d supply, fill #0

## 2021-05-02 DIAGNOSIS — Z7409 Other reduced mobility: Secondary | ICD-10-CM | POA: Diagnosis not present

## 2021-05-02 DIAGNOSIS — G8929 Other chronic pain: Secondary | ICD-10-CM | POA: Diagnosis not present

## 2021-05-02 DIAGNOSIS — R29898 Other symptoms and signs involving the musculoskeletal system: Secondary | ICD-10-CM | POA: Diagnosis not present

## 2021-05-02 DIAGNOSIS — M67962 Unspecified disorder of synovium and tendon, left lower leg: Secondary | ICD-10-CM | POA: Diagnosis not present

## 2021-05-02 DIAGNOSIS — M76822 Posterior tibial tendinitis, left leg: Secondary | ICD-10-CM | POA: Diagnosis not present

## 2021-05-02 DIAGNOSIS — M545 Low back pain, unspecified: Secondary | ICD-10-CM | POA: Diagnosis not present

## 2021-05-02 DIAGNOSIS — Z789 Other specified health status: Secondary | ICD-10-CM | POA: Diagnosis not present

## 2021-05-06 ENCOUNTER — Other Ambulatory Visit: Payer: Self-pay

## 2021-05-06 ENCOUNTER — Other Ambulatory Visit (HOSPITAL_BASED_OUTPATIENT_CLINIC_OR_DEPARTMENT_OTHER): Payer: Self-pay

## 2021-05-06 ENCOUNTER — Encounter (HOSPITAL_BASED_OUTPATIENT_CLINIC_OR_DEPARTMENT_OTHER): Payer: Self-pay | Admitting: *Deleted

## 2021-05-06 ENCOUNTER — Emergency Department (HOSPITAL_BASED_OUTPATIENT_CLINIC_OR_DEPARTMENT_OTHER): Payer: 59

## 2021-05-06 ENCOUNTER — Emergency Department (HOSPITAL_BASED_OUTPATIENT_CLINIC_OR_DEPARTMENT_OTHER)
Admission: EM | Admit: 2021-05-06 | Discharge: 2021-05-06 | Disposition: A | Discharge status: Home / Self Care | Payer: 59 | Attending: Emergency Medicine | Admitting: Emergency Medicine

## 2021-05-06 DIAGNOSIS — Z96652 Presence of left artificial knee joint: Secondary | ICD-10-CM | POA: Diagnosis not present

## 2021-05-06 DIAGNOSIS — I82452 Acute embolism and thrombosis of left peroneal vein: Secondary | ICD-10-CM | POA: Diagnosis not present

## 2021-05-06 DIAGNOSIS — R079 Chest pain, unspecified: Secondary | ICD-10-CM | POA: Diagnosis not present

## 2021-05-06 DIAGNOSIS — E1169 Type 2 diabetes mellitus with other specified complication: Secondary | ICD-10-CM | POA: Diagnosis not present

## 2021-05-06 DIAGNOSIS — Z7901 Long term (current) use of anticoagulants: Secondary | ICD-10-CM | POA: Diagnosis not present

## 2021-05-06 DIAGNOSIS — Z7984 Long term (current) use of oral hypoglycemic drugs: Secondary | ICD-10-CM | POA: Insufficient documentation

## 2021-05-06 DIAGNOSIS — I82451 Acute embolism and thrombosis of right peroneal vein: Secondary | ICD-10-CM | POA: Diagnosis not present

## 2021-05-06 DIAGNOSIS — I2699 Other pulmonary embolism without acute cor pulmonale: Secondary | ICD-10-CM | POA: Diagnosis not present

## 2021-05-06 DIAGNOSIS — E785 Hyperlipidemia, unspecified: Secondary | ICD-10-CM | POA: Diagnosis not present

## 2021-05-06 DIAGNOSIS — R0602 Shortness of breath: Secondary | ICD-10-CM | POA: Diagnosis not present

## 2021-05-06 DIAGNOSIS — Z79899 Other long term (current) drug therapy: Secondary | ICD-10-CM | POA: Diagnosis not present

## 2021-05-06 DIAGNOSIS — R0902 Hypoxemia: Secondary | ICD-10-CM | POA: Diagnosis not present

## 2021-05-06 DIAGNOSIS — I2694 Multiple subsegmental pulmonary emboli without acute cor pulmonale: Secondary | ICD-10-CM | POA: Diagnosis not present

## 2021-05-06 DIAGNOSIS — R2242 Localized swelling, mass and lump, left lower limb: Secondary | ICD-10-CM | POA: Diagnosis present

## 2021-05-06 LAB — CBC WITH DIFFERENTIAL/PLATELET
Abs Immature Granulocytes: 0.05 10*3/uL (ref 0.00–0.07)
Basophils Absolute: 0.1 10*3/uL (ref 0.0–0.1)
Basophils Relative: 1 %
Eosinophils Absolute: 0.3 10*3/uL (ref 0.0–0.5)
Eosinophils Relative: 3 %
HCT: 44.9 % (ref 39.0–52.0)
Hemoglobin: 14.8 g/dL (ref 13.0–17.0)
Immature Granulocytes: 1 %
Lymphocytes Relative: 21 %
Lymphs Abs: 2 10*3/uL (ref 0.7–4.0)
MCH: 30.2 pg (ref 26.0–34.0)
MCHC: 33 g/dL (ref 30.0–36.0)
MCV: 91.6 fL (ref 80.0–100.0)
Monocytes Absolute: 0.8 10*3/uL (ref 0.1–1.0)
Monocytes Relative: 9 %
Neutro Abs: 6.3 10*3/uL (ref 1.7–7.7)
Neutrophils Relative %: 65 %
Platelets: 331 10*3/uL (ref 150–400)
RBC: 4.9 MIL/uL (ref 4.22–5.81)
RDW: 14.5 % (ref 11.5–15.5)
WBC: 9.5 10*3/uL (ref 4.0–10.5)
nRBC: 0 % (ref 0.0–0.2)

## 2021-05-06 LAB — COMPREHENSIVE METABOLIC PANEL
ALT: 18 U/L (ref 0–44)
AST: 14 U/L — ABNORMAL LOW (ref 15–41)
Albumin: 3.8 g/dL (ref 3.5–5.0)
Alkaline Phosphatase: 61 U/L (ref 38–126)
Anion gap: 10 (ref 5–15)
BUN: 12 mg/dL (ref 8–23)
CO2: 23 mmol/L (ref 22–32)
Calcium: 8.6 mg/dL — ABNORMAL LOW (ref 8.9–10.3)
Chloride: 107 mmol/L (ref 98–111)
Creatinine, Ser: 0.84 mg/dL (ref 0.61–1.24)
GFR, Estimated: >60 mL/min (ref >60)
Glucose, Bld: 95 mg/dL (ref 70–99)
Potassium: 3.8 mmol/L (ref 3.5–5.1)
Sodium: 140 mmol/L (ref 135–145)
Total Bilirubin: 0.4 mg/dL (ref 0.3–1.2)
Total Protein: 6.9 g/dL (ref 6.5–8.1)

## 2021-05-06 LAB — TROPONIN I (HIGH SENSITIVITY): Troponin I (High Sensitivity): 5 ng/L (ref <18)

## 2021-05-06 MED ORDER — RIVAROXABAN (XARELTO) VTE STARTER PACK (15 & 20 MG)
ORAL_TABLET | ORAL | 0 refills | Status: DC
  Filled 2021-05-06: qty 51, 30d supply, fill #0

## 2021-05-06 MED ORDER — RIVAROXABAN 20 MG PO TABS
20.0000 mg | ORAL_TABLET | Freq: Every day | ORAL | Status: DC
Start: 2021-05-27 — End: 2021-05-06

## 2021-05-06 MED ORDER — RIVAROXABAN 15 MG PO TABS
15.0000 mg | ORAL_TABLET | Freq: Two times a day (BID) | ORAL | Status: DC
Start: 2021-05-06 — End: 2021-05-06
  Administered 2021-05-06: 15 mg via ORAL
  Filled 2021-05-06 (×2): qty 1

## 2021-05-06 MED ORDER — IOHEXOL 350 MG/ML SOLN
75.0000 mL | Freq: Once | INTRAVENOUS | Status: AC | PRN
  Administered 2021-05-06: 75 mL via INTRAVENOUS

## 2021-05-06 MED ORDER — RIVAROXABAN (XARELTO) EDUCATION KIT FOR DVT/PE PATIENTS: PACK | Freq: Once | Status: AC

## 2021-05-06 NOTE — ED Provider Notes (Signed)
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  60 year old male with past medical history below including type 2 diabetes mellitus, hyperlipidemia, degenerative disc disease, migraines who presents with left leg swelling.  4 days ago, patient began having some pain in his left lower leg including calf and symptoms have progressed and are now associated with left leg swelling compared to right.  He reports pain is now moving up the back of his knee towards his distal thigh.  He denies any injury or previous history of problems in this leg although he does note he had a previous knee replacement on left leg.  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  He reports the shortness of breath is not present at rest and he denies any cough/cold symptoms.  He has mild chest tightness when the shortness of breath is present but denies any sharp or pleuritic chest pain.  No chest pain currently.  He flew back from [place] approximately 1 month ago.  He denies any personal history of blood clots or cancer.  The history is provided by the patient.  Leg Pain     Past Medical History:  Diagnosis Date   Anxiety    Aortic atherosclerosis (HCC)    BPH (benign prostatic hyperplasia)    Chronic pain syndrome    neck and low back   DDD (degenerative disc disease), cervical    Diverticulosis    DJD (degenerative joint disease)    Ganglion cyst    12 mm , right posterior knee   Hyperlipidemia    Internal hemorrhoids    Migraines    OA (osteoarthritis)    knees, hands, right shoulder   Positive QuantiFERON-TB Gold test    Shoulder pain    Tear of right rotator cuff    Type 2  diabetes mellitus ([place])    last A1c 5.4 on 04-13-2018 in epic (followed by pcp)   Wears glasses     Patient Active Problem List   Diagnosis Date Noted   Right shoulder injury 06/15/2018   S/P arthroscopy of right shoulder 06/15/2018   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   OA (osteoarthritis) of knee 12/08/2016   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 without status migrainosus 12/17/2015   Cervical facet joint syndrome 12/17/2015   Chronic bilateral low back pain without sciatica 12/17/2015   Primary osteoarthritis of left knee 12/17/2015    Past Surgical History:  Procedure Laterality Date   ANAL FISSURE REPAIR     ARTHOSCOPIC ROTAOR CUFF REPAIR Right 06/15/2018   Procedure: RIGHT SHOULDER ARTHROSCOPY, DEBRIDEMENT, SUBACROMIAL DECOMPRESSION, DISTAL CLAVICLE RESECTION, ROTATOR CUFF REPAIR;  Surgeon: [name], MD;  Location: [facility];  Service: Orthopedics;  Laterality: Right;   COLONOSCOPY  05-26-2017   dr Henrene Pastor   Hip Resurface  2006   KNEE ARTHROSCOPY W/ MENISCAL REPAIR Bilateral right 1993; left 2010   MASS EXCISION Right 04/21/2018   Procedure: EXCISION RIGHT THIGH SOFT TISSUE MASS;  Surgeon: [name], MD;  Location: [facility];  Service: Orthopedics;  Laterality: Right;   SEPTOPLASTY     SHOULDER ARTHROSCOPY WITH ROTATOR CUFF  REPAIR Left    SLAP Tear Repair  2008   TONSILLECTOMY     TOTAL KNEE ARTHROPLASTY Left 12/08/2016   Procedure: LEFT TOTAL KNEE ARTHROPLASTY;  Surgeon: [name], MD;  Location: [facility];  Service: Orthopedics;  Laterality: Left;   TOTAL KNEE ARTHROPLASTY WITH REVISION COMPONENTS Left 04/21/2018   Procedure: Left knee polyethylene exchange ;  Surgeon: [name], MD;  Location: [facility];  Service: Orthopedics;  Laterality: Left;       Family History  Problem Relation Age of Onset   Heart attack Father 57    Social History   Tobacco Use   Smoking status: Never   Smokeless tobacco: Never  Vaping Use    Vaping Use: Never used  Substance Use Topics   Alcohol use: Yes    Comment: seldom   Drug use: Never    Home Medications Prior to Admission medications   Medication Sig Start Date End Date Taking? Authorizing Provider  baclofen (LIORESAL) 10 MG tablet Take 1-2 tablets (10-20 mg total) by mouth 2 (two) times daily as needed. 06/28/20  Yes [name], MD  RIVAROXABAN [name]) VTE STARTER PACK (15 & 20 MG) Follow package directions: Take one 56m tablet by mouth twice a day. On day 22, switch to one 273mtablet once a day. Take with food. 05/06/21  Yes [name], RaWenda OverlandMD  tamsulosin (FLOMAX) 0.4 MG CAPS capsule Take 1 capsule (0.4 mg total) by mouth 2 (two) times daily. 09/21/20  Yes PaLeanna BattlesMD  baclofen (LIORESAL) 10 MG tablet Take 10 mg by mouth 3 (three) times daily. 1-2 tablets three times a day    [provider]  butalbital-acetaminophen-caffeine (FIORICET) 50-325-40 MG tablet TAKE 1 TO 2 TABLETS BY MOUTH EVERY 4-6 HOURS AS NEEDED (MAX 6 TABS IN 24 HOURS) 06/28/20 06/28/21  PaLeanna BattlesMD  butalbital-acetaminophen-caffeine (FIORICET, ESGIC) 50660-166-7934G per tablet Take 1-2 tablets by mouth every 4 (four) hours as needed for headache.     [provider]  CIALIS 5 MG tablet Take 5 mg by mouth every evening.  10/09/16   [provider]  COVID-19 At Home Antigen Test ([facility]19 HOME TEST) KIT Use as directed 03/29/21   FuJefm BryantRPH  COVID-19 mRNA Vac-TriS, Pfizer, SUSP injection Inject into the muscle. 01/31/21   SnCarlyle BasquesMD  cyclobenzaprine (FLEXERIL) 10 MG tablet Take 10 mg by mouth 3 (three) times daily as needed for muscle spasms.    [provider]  diazepam (VALIUM) 5 MG tablet Take 5 mg by mouth 2 (two) times daily. 09/25/20   [provider]  diazepam (VALIUM) 5 MG tablet TAKE 1 TABLET BY MOUTH TWICE DAILY.TAKE 12 HOURS APART 12/18/20 06/16/21  PaLeanna BattlesMD  diazepam (VALIUM) 5 MG tablet  TAKE 1 TABLET BY MOUTH TWICE DAILY. USE 12 HOURS APART 12/18/20 07/07/21  PaLeanna BattlesMD  dicyclomine (BENTYL) 20 MG tablet TAKE 1 TABLET BY MOUTH EVERY 4 TO 6 HOURS AS NEEDED FOR CRAMPING. 12/18/20 12/18/21  PeIrene ShipperMD  fluticasone ([facility] MCG/ACT nasal spray Place 2 sprays into both nostrils daily as needed for allergies.     [provider]  gabapentin (NEURONTIN) 300 MG capsule Take 300 mg by mouth 3 (three) times daily. 06/28/20   [provider]  gabapentin (NEURONTIN) 300 MG capsule Take 1 capsule (300 mg total) by mouth 3 (three) times daily. 11/22/20   PaLeanna BattlesMD  hydrocortisone (ANUSOL-HC) 25 MG suppository PLACE 1 SUPPOSITORY (  25 MG TOTAL) RECTALLY AT BEDTIME. 05/10/20 05/10/21  [name], MD  Hyoscyamine Sulfate SL (LEVSIN/SL) 0.125 MG SUBL Place 1 each under the tongue 4 (four) times daily as needed for up to 5 days. 05/30/20 06/04/20  [name], MD  Ipratropium-Albuterol (COMBIVENT RESPIMAT) 20-100 MCG/ACT AERS respimat Inhale 2 puffs into the lungs every 6 (six) hours as needed. 04/19/21     ketoconazole (NIZORAL) 2 % cream Apply 1 application topically 2 (two) times daily as needed for irritation.     [provider]  ketoconazole (NIZORAL) 2 % cream APPLY TOPICALLY TO AFFECTED AREA TWICE DAILY. 08/28/20   [name], MD  lidocaine (LIDODERM) 5 % Place 1 patch onto the skin daily. Remove & Discard patch within 12 hours or as directed by MD    [provider]  lidocaine (LIDODERM) 5 % APPLY 2 PATCHES TOPICALLY TO THE AFFECTED AREAS DAILY (12 HOURS ON AND 12 HOURS OFF). 11/22/20 11/22/21  [name], MD  metFORMIN (GLUCOPHAGE) 1000 MG tablet Take 1,000 mg by mouth 2 (two) times daily with a meal.    [provider]  metFORMIN (GLUCOPHAGE) 1000 MG tablet Take 1 tablet (1,000 mg total) by mouth 2 (two) times daily. 08/28/20   [name], MD  methocarbamol (ROBAXIN) 500 MG tablet TAKE ONE TABLET BY  MOUTH UP TO 3 TIMES DAILY AS NEEDED FOR TEARFUL MUSCLE SPASMS AND PAIN 06/28/20 07/07/21  [name], MD  methocarbamol (ROBAXIN-750) 750 MG tablet Take 1 tablet (750 mg total) by mouth every 8 (eight) hours as needed for muscle spasms. 06/15/18   Stilwell, Bryson L, PA-C  metoprolol tartrate (LOPRESSOR) 100 MG tablet TAKE 1 TABLET (100MG) TWO HOURS PRIOR TO CARDIAC CT SCAN. 11/21/20 11/21/21  Hilty, Nadean Corwin, MD  metoprolol tartrate (LOPRESSOR) 50 MG tablet TAKE ONE TABLET TWO HOURS PRIOR TO TEST. 11/12/20 11/12/21  Hilty, Nadean Corwin, MD  OVER THE COUNTER MEDICATION Take 1 capsule by mouth 2 (two) times daily. Super Beta Prostate Supplement    [provider]  oxyCODONE (OXY IR/ROXICODONE) 5 MG immediate release tablet TAKE ONE OR TWO TABLETS BY MOUTH EVERY EIGHT HOURS AS NEEDED FOR PAIN. 12/18/20 06/16/21  [name], MD  oxyCODONE (OXY IR/ROXICODONE) 5 MG immediate release tablet Take 1-2 tablets (5-10 mg total) by mouth every 8 (eight) hours as needed for pain. 12/18/20 06/16/21  [name], MD  oxyCODONE (OXY IR/ROXICODONE) 5 MG immediate release tablet TAKE 1 TO 2 TABLETS BY MOUTH EVERY 8 HOURS AS NEEDED FOR PAIN 11/22/20 05/21/21  [name], MD  oxycodone (OXY-IR) 5 MG capsule Take 5 mg by mouth 3 (three) times daily.    [provider]  oxymetazoline (AFRIN) 0.05 % nasal spray Place 2 sprays into both nostrils 2 (two) times daily as needed for congestion.     [provider]  pantoprazole (PROTONIX) 40 MG tablet Take 1 tablet (40 mg total) by mouth daily. 09/25/20   [name], MD  polyethylene glycol (MIRALAX / GLYCOLAX) 17 g packet Take 17 g by mouth daily. As needed    [provider]  pravastatin (PRAVACHOL) 40 MG tablet Take 40 mg by mouth every evening.  01/25/18   [provider]  pravastatin (PRAVACHOL) 40 MG tablet Take 1 tablet (40 mg total) by mouth daily. 06/28/20   [name], MD  pseudoephedrine (SUDAFED) 120 MG 12 hr  tablet Take 120 mg by mouth daily as needed for congestion.    [provider]  Psyllium (  METAMUCIL PO) Take by mouth. 1 tablet every day    [provider]  Rimegepant Sulfate (NURTEC) 75 MG TBDP Place 75 mg under the tongue daily as needed. 10/18/20   [provider]  tadalafil (CIALIS) 5 MG tablet TAKE 1 TABLET BY MOUTH ONCE DAILY FOR BENIGN PROSTATIC HYPERTROPHY. 06/28/20   [name], MD  tamsulosin (FLOMAX) 0.4 MG CAPS capsule Take 0.4 mg by mouth 2 (two) times daily.     [provider]  topiramate (TOPAMAX) 25 MG tablet Take 25 mg by mouth at bedtime.     [provider]  topiramate (TOPAMAX) 25 MG tablet Take 1 tablet (25 mg total) by mouth at bedtime to prevent headaches. 11/23/20   [name], MD  traMADol (ULTRAM) 50 MG tablet Take 50 mg by mouth 3 (three) times daily.    [provider]  traMADol (ULTRAM) 50 MG tablet Take 1-2 tablets (50-100 mg total) by mouth every 6 (six) hours as needed for pain. Do not exceed 8 tablets per day. 04/08/21     zolpidem (AMBIEN) 10 MG tablet Take 10 mg by mouth at bedtime as needed for sleep.    [provider]  zolpidem (AMBIEN) 10 MG tablet TAKE 1 TABLET BY MOUTH AT BEDTIME AS NEEDED FOR SLEEP 06/28/20 12/25/20  [name], MD  zolpidem (AMBIEN) 10 MG tablet Take 1 tablet (10 mg total) by mouth at bedtime as needed for sleep. 04/08/21       Allergies    Benzoin compound  Review of Systems   Review of Systems All other systems reviewed and are negative except that which was mentioned in HPI  Physical Exam Updated Vital Signs BP (!) 143/81   Pulse 72   Temp 98.2 F (36.8 C) (Oral)   Resp 15   Ht 6' (1.829 m)   Wt (!) 136.5 kg   SpO2 100%   BMI 40.81 kg/m   Physical Exam Vitals and nursing note reviewed.  Constitutional:      General: He is not in acute distress.    Appearance: Normal appearance.  HENT:     Head: Normocephalic and atraumatic.  Eyes:      Conjunctiva/sclera: Conjunctivae normal.  Cardiovascular:     Rate and Rhythm: Normal rate and regular rhythm.     Heart sounds: Normal heart sounds. No murmur heard. Pulmonary:     Effort: Pulmonary effort is normal.     Breath sounds: Normal breath sounds.  Abdominal:     General: Abdomen is flat. Bowel sounds are normal. There is no distension.     Palpations: Abdomen is soft.     Tenderness: There is no abdominal tenderness.  Musculoskeletal:     Right lower leg: No edema.     Left lower leg: Edema present.     Comments: Edema of L leg to knee compared to R with mild tenderness of calf and posterior knee; no skin changes/warmth/erythema; 2+ DP and PT pulses  Skin:    General: Skin is warm and dry.  Neurological:     Mental Status: He is alert and oriented to person, place, and time.     Comments: fluent  Psychiatric:        Mood and Affect: Mood normal.        Behavior: Behavior normal.     Comments: pleasant    ED Results / Procedures / Treatments   Labs (all labs ordered are listed, but only abnormal results are displayed) Labs Reviewed  COMPREHENSIVE METABOLIC PANEL - Abnormal; Notable for the following components:      Result Value   Calcium 8.6 (*)    AST 14 (*)    All other components within normal limits  CBC WITH DIFFERENTIAL/PLATELET  TROPONIN I (HIGH SENSITIVITY)    EKG EKG Interpretation  Date/Time:  Monday May 06 2021 08:48:40 EDT Ventricular Rate:  87 PR Interval:  154 QRS Duration: 109 QT Interval:  374 QTC Calculation: 450 R Axis:   84 Text Interpretation: Sinus rhythm Borderline right axis deviation Baseline wander in lead(s) V4 V5 similar to previous Confirmed by Theotis Burrow [phone]) on 05/06/2021 8:52:15 AM  Radiology CT Angio Chest PE W/Cm &/Or Wo Cm  Result Date: 05/06/2021 CLINICAL DATA:  High probability for pulmonary embolism. Swelling and pain in the left leg starting on Thursday. Hypoxia EXAM: CT ANGIOGRAPHY CHEST WITH CONTRAST  TECHNIQUE: Multidetector CT imaging of the chest was performed using the standard protocol during bolus administration of intravenous contrast. Multiplanar CT image reconstructions and MIPs were obtained to evaluate the vascular anatomy. CONTRAST:  38m OMNIPAQUE IOHEXOL 350 MG/ML SOLN COMPARISON:  None similar FINDINGS: Cardiovascular: Normal heart size. No pericardial effusion. Prominent main pulmonary artery diameter of 33 mm but normal RV to LV ratio. Branching central pulmonary artery filling defects are seen in multiple segments affecting the bilateral lower and right upper lobes to the greatest degree. No acute aortic finding. Mediastinum/Nodes: Negative for adenopathy or mass Lungs/Pleura: No lung infarct or pulmonary edema. Upper Abdomen: Negative Musculoskeletal: Multilevel thoracic disc space narrowing and mild ridging. Review of the MIP images confirms the above findings. Critical Value/emergent results were called by telephone at the time of interpretation on 05/06/2021 at 11:14 am to provider Ronda Kazmi , who verbally acknowledged these results. IMPRESSION: Acute pulmonary embolism affecting multiple segmental branches. Electronically Signed   By: JMonte FantasiaM.D.   On: 05/06/2021 11:23   [place]Venous Img Lower  Left (DVT Study)  Result Date: 05/06/2021 CLINICAL DATA:  Left lower leg pain and swelling EXAM: LEFT LOWER EXTREMITY VENOUS DOPPLER ULTRASOUND TECHNIQUE: Gray-scale sonography with graded compression, as well as color Doppler and duplex ultrasound were performed to evaluate the lower extremity deep venous systems from the level of the common femoral vein and including the common femoral, femoral, profunda femoral, popliteal and calf veins including the posterior tibial, peroneal and gastrocnemius veins when visible. The superficial great saphenous vein was also interrogated. Spectral Doppler was utilized to evaluate flow at rest and with distal augmentation maneuvers in the common  femoral, femoral and popliteal veins. COMPARISON:  None. FINDINGS: Contralateral Common Femoral Vein: Respiratory phasicity is normal and symmetric with the symptomatic side. No evidence of thrombus. Normal compressibility. Common Femoral Vein: No evidence of thrombus. Normal compressibility, respiratory phasicity and response to augmentation. Saphenofemoral Junction: No evidence of thrombus. Normal compressibility and flow on color Doppler imaging. Profunda Femoral Vein: No evidence of thrombus. Normal compressibility and flow on color Doppler imaging. Femoral Vein: The femoral vein is patent and compressible in the proximal and mid thigh. However, in the distal thigh the vessel becomes noncompressible in the lumen filled with low-level internal echoes. Color flow demonstrates partial flow on color Doppler imaging. Popliteal Vein: A incompletely occlusive thrombus extends into the popliteal vein. Calf Veins: Thrombus extends into the peroneal veins. Thrombus is occlusive in the peroneal so. The posterior tibial veins are patent and compressible. Superficial Great Saphenous Vein: No evidence of thrombus. Normal compressibility. Venous Reflux:  None. Other Findings:  None. IMPRESSION: 1. Positive for acute to subacute occlusive DVT in the peroneal veins of the calf and incompletely occlusive DVT in the popliteal vein and the femoral vein in the distal thigh. Electronically Signed   By: Jacqulynn Cadet M.D.   On: 05/06/2021 09:59    Procedures Procedures   Medications Ordered in ED Medications  Rivaroxaban (XARELTO) tablet 15 mg (15 mg Oral Given 05/06/21 1156)    Followed by  rivaroxaban (XARELTO) tablet 20 mg (has no administration in time range)  rivaroxaban (XARELTO) Education Kit for DVT/PE patients (has no administration in time range)  iohexol (OMNIPAQUE) 350 MG/ML injection 75 mL (75 mLs Intravenous Contrast Given 05/06/21 1056)    ED Course  I have reviewed the triage vital signs and the  nursing notes.  Pertinent labs & imaging results that were available during my care of the patient were reviewed by me and considered in my medical decision making (see chart for details).    MDM Rules/Calculators/A&P                           Well-appearing on exam, reassuring vital signs, normal heart rate and 99 to 100% on room air.  EKG shows sinus rhythm with no acute ischemic changes.  Left leg swelling is concerning for DVT, differential includes venous stasis; doubt cellulitis as no skin changes to support this.  I am also concerned about the possibility of PE given his report of shortness of breath and desaturations and travel 1 month ago.   Lower extremity ultrasound positive for DVT in peroneal and popliteal and femoral veins.  CTA of chest is positive for PE and several segmental branches on the right with no evidence of infarct or right heart strain.  Troponin is normal and I am reassured by his normal vital signs.  He ambulated in the hallway and saturations remained in the 90s with no respiratory distress.  I discussed options of observation admission with initiation of anticoagulation versus discharge with close outpatient follow-up.  Patient would prefer discharge and understands need to follow-up for ongoing work-up and management.  Discussed options and patient is comfortable starting Xarelto.  He denies any history of bleeding problems, GI bleed, or recent head injury.  Contacted pharmacist Apolonio Schneiders to assist with the medication initiation and voucher for first month.  Reviewed return precautions.  He voiced understanding. Final Clinical Impression(s) / ED Diagnoses Final diagnoses:  Multiple subsegmental pulmonary emboli without acute cor pulmonale (HCC)  Acute deep vein thrombosis (DVT) of left peroneal vein ([place])    Rx / DC Orders ED Discharge Orders          Ordered    RIVAROXABAN (XARELTO) VTE STARTER PACK (15 & 20 MG)        05/06/21 1141             Haley Roza,  Wenda Overland, MD 05/07/21 1607

## 2021-05-06 NOTE — ED Triage Notes (Signed)
Pt reports swelling and pain in left leg staring last Thursday, aching, throbbing in left post leg that has come up posterior leg. Concerned Sats are dropping as low as 82% at work while walking or moving.

## 2021-05-06 NOTE — ED Notes (Signed)
ED Provider at bedside. 

## 2021-05-06 NOTE — ED Notes (Signed)
Patient ambulated. Patient maintained at 95-98% on RA without desating. Pt's HR stayed around 91-98

## 2021-05-06 NOTE — Discharge Instructions (Addendum)
Information on my medicine - XARELTO® (rivaroxaban) ° °This medication education was reviewed with me or my healthcare representative as part of my discharge preparation.   ° °WHY WAS XARELTO® PRESCRIBED FOR YOU? °Xarelto® was prescribed to treat blood clots that may have been found in the veins of your legs (deep vein thrombosis) or in your lungs (pulmonary embolism) and to reduce the risk of them occurring again. ° °What do you need to know about Xarelto®? °The starting dose is one 15 mg tablet taken TWICE daily with food for the FIRST 21 DAYS then the dose is changed to one 20 mg tablet taken ONCE A DAY with your evening meal. ° °DO NOT stop taking Xarelto® without talking to the health care provider who prescribed the medication.  Refill your prescription for 20 mg tablets before you run out. ° °After discharge, you should have regular check-up appointments with your healthcare provider that is prescribing your Xarelto®.  In the future your dose may need to be changed if your kidney function changes by a significant amount. ° °What do you do if you miss a dose? °If you are taking Xarelto® TWICE DAILY and you miss a dose, take it as soon as you remember. You may take two 15 mg tablets (total 30 mg) at the same time then resume your regularly scheduled 15 mg twice daily the next day. ° °If you are taking Xarelto® ONCE DAILY and you miss a dose, take it as soon as you remember on the same day then continue your regularly scheduled once daily regimen the next day. Do not take two doses of Xarelto® at the same time.  ° °Important Safety Information °Xarelto® is a blood thinner medicine that can cause bleeding. You should call your healthcare provider right away if you experience any of the following: °? Bleeding from an injury or your nose that does not stop. °? Unusual colored urine (red or dark brown) or unusual colored stools (red or black). °? Unusual bruising for unknown reasons. °? A serious fall or if you hit  your head (even if there is no bleeding). ° °Some medicines may interact with Xarelto® and might increase your risk of bleeding while on Xarelto®. To help avoid this, consult your healthcare provider or pharmacist prior to using any new prescription or non-prescription medications, including herbals, vitamins, non-steroidal anti-inflammatory drugs (NSAIDs) and supplements. ° °This website has more information on Xarelto®: www.xarelto.com. °

## 2021-05-08 ENCOUNTER — Other Ambulatory Visit (HOSPITAL_COMMUNITY): Payer: Self-pay

## 2021-05-08 DIAGNOSIS — Z760 Encounter for issue of repeat prescription: Secondary | ICD-10-CM | POA: Diagnosis not present

## 2021-05-10 ENCOUNTER — Other Ambulatory Visit: Payer: Self-pay

## 2021-05-10 ENCOUNTER — Encounter: Payer: 59 | Attending: Physical Medicine & Rehabilitation | Admitting: Physical Medicine & Rehabilitation

## 2021-05-10 ENCOUNTER — Encounter: Payer: Self-pay | Admitting: Physical Medicine & Rehabilitation

## 2021-05-10 VITALS — Temp 98.1°F | Ht 72.0 in | Wt 294.2 lb

## 2021-05-10 DIAGNOSIS — G43719 Chronic migraine without aura, intractable, without status migrainosus: Secondary | ICD-10-CM | POA: Diagnosis not present

## 2021-05-10 NOTE — Progress Notes (Signed)
Botox injection for chronic migraine prophylaxis.  Indication: History of migraines with greater than 15 headaches per month despite trials of oral medications.  Informed consent was obtained after discussing risks and benefits of the procedure with the patient this included bleeding bruising infection as well as facial drooping, eyelid lag, systemic effects of Botox. Patient elects to proceed and has given written consent.  Last Botox August 2021, HA freq increased   Patient placed in a seated position Dilution 50 units per cc  Muscles injected with dose  Procerus 5 units Corrugator 5 units bilateral Frontalis 5 units 2 injection sites on the right and 2 injection sites on the left Temporalis 5 units in 4 injection sites on the right and 4 injection sites on the left Occipitalis 5 units into 3 injections sites on the right and 3 injection sites on the left  Cervical paraspinals 5 units into 2 injection sites on the right and 2 injection sites on the left trapezius 5 units into 3 injection sites on the right and 3 injection sites on the left bilateral levator scapula, 5 units bilaterally Patient tolerated procedure well. Post procedure instructions given.  Right L4 Paraspinal 25U Appointment for repeat injection , pt will call as he gets >6 mo relief from treatment  Wasted 10U

## 2021-05-10 NOTE — Patient Instructions (Signed)

## 2021-05-13 DIAGNOSIS — I2694 Multiple subsegmental pulmonary emboli without acute cor pulmonale: Secondary | ICD-10-CM | POA: Diagnosis not present

## 2021-05-13 DIAGNOSIS — I82452 Acute embolism and thrombosis of left peroneal vein: Secondary | ICD-10-CM | POA: Diagnosis not present

## 2021-05-13 DIAGNOSIS — M9904 Segmental and somatic dysfunction of sacral region: Secondary | ICD-10-CM | POA: Diagnosis not present

## 2021-05-13 DIAGNOSIS — M9901 Segmental and somatic dysfunction of cervical region: Secondary | ICD-10-CM | POA: Diagnosis not present

## 2021-05-13 DIAGNOSIS — I10 Essential (primary) hypertension: Secondary | ICD-10-CM | POA: Diagnosis not present

## 2021-05-13 DIAGNOSIS — M9902 Segmental and somatic dysfunction of thoracic region: Secondary | ICD-10-CM | POA: Diagnosis not present

## 2021-05-13 DIAGNOSIS — M9903 Segmental and somatic dysfunction of lumbar region: Secondary | ICD-10-CM | POA: Diagnosis not present

## 2021-05-13 DIAGNOSIS — E1151 Type 2 diabetes mellitus with diabetic peripheral angiopathy without gangrene: Secondary | ICD-10-CM | POA: Diagnosis not present

## 2021-05-13 DIAGNOSIS — Z7901 Long term (current) use of anticoagulants: Secondary | ICD-10-CM | POA: Diagnosis not present

## 2021-05-15 ENCOUNTER — Telehealth: Payer: Self-pay | Admitting: Oncology

## 2021-05-15 NOTE — Telephone Encounter (Signed)
Received a new hem referral from Dr. Philip Aspen for acute DVT/PE- hypercoagulable state. Dr. 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 Benay Spice on 8/22 at 1:40pm. Dr. Sherral Hammers has requested to see Dr. Benay Spice.

## 2021-05-27 ENCOUNTER — Other Ambulatory Visit (HOSPITAL_COMMUNITY): Payer: Self-pay

## 2021-05-27 DIAGNOSIS — E785 Hyperlipidemia, unspecified: Secondary | ICD-10-CM | POA: Diagnosis not present

## 2021-05-27 DIAGNOSIS — E1151 Type 2 diabetes mellitus with diabetic peripheral angiopathy without gangrene: Secondary | ICD-10-CM | POA: Diagnosis not present

## 2021-05-27 DIAGNOSIS — I2694 Multiple subsegmental pulmonary emboli without acute cor pulmonale: Secondary | ICD-10-CM | POA: Diagnosis not present

## 2021-05-27 DIAGNOSIS — I10 Essential (primary) hypertension: Secondary | ICD-10-CM | POA: Diagnosis not present

## 2021-05-27 DIAGNOSIS — I82432 Acute embolism and thrombosis of left popliteal vein: Secondary | ICD-10-CM | POA: Diagnosis not present

## 2021-05-27 MED ORDER — XARELTO 20 MG PO TABS
20.0000 mg | ORAL_TABLET | Freq: Every day | ORAL | 3 refills | Status: DC
  Filled 2021-05-27: qty 90, 90d supply, fill #0
  Filled 2021-09-02: qty 90, 90d supply, fill #1
  Filled 2021-12-30: qty 90, 90d supply, fill #2
  Filled 2022-03-31: qty 90, 90d supply, fill #3

## 2021-05-31 ENCOUNTER — Other Ambulatory Visit (HOSPITAL_COMMUNITY): Payer: Self-pay

## 2021-06-03 ENCOUNTER — Inpatient Hospital Stay: Payer: 59 | Attending: Oncology | Admitting: Oncology

## 2021-06-03 ENCOUNTER — Other Ambulatory Visit: Payer: Self-pay

## 2021-06-03 ENCOUNTER — Inpatient Hospital Stay: Payer: 59

## 2021-06-03 VITALS — BP 130/78 | HR 100 | Temp 97.8°F | Resp 20 | Ht 72.0 in | Wt 299.2 lb

## 2021-06-03 DIAGNOSIS — E785 Hyperlipidemia, unspecified: Secondary | ICD-10-CM | POA: Insufficient documentation

## 2021-06-03 DIAGNOSIS — Z7901 Long term (current) use of anticoagulants: Secondary | ICD-10-CM | POA: Diagnosis not present

## 2021-06-03 DIAGNOSIS — I82402 Acute embolism and thrombosis of unspecified deep veins of left lower extremity: Secondary | ICD-10-CM | POA: Diagnosis not present

## 2021-06-03 DIAGNOSIS — M25562 Pain in left knee: Secondary | ICD-10-CM | POA: Diagnosis not present

## 2021-06-03 DIAGNOSIS — E119 Type 2 diabetes mellitus without complications: Secondary | ICD-10-CM | POA: Diagnosis not present

## 2021-06-03 DIAGNOSIS — M549 Dorsalgia, unspecified: Secondary | ICD-10-CM | POA: Diagnosis not present

## 2021-06-03 DIAGNOSIS — M545 Low back pain, unspecified: Secondary | ICD-10-CM | POA: Diagnosis not present

## 2021-06-03 DIAGNOSIS — G894 Chronic pain syndrome: Secondary | ICD-10-CM | POA: Insufficient documentation

## 2021-06-03 DIAGNOSIS — G43909 Migraine, unspecified, not intractable, without status migrainosus: Secondary | ICD-10-CM | POA: Insufficient documentation

## 2021-06-03 DIAGNOSIS — M542 Cervicalgia: Secondary | ICD-10-CM | POA: Diagnosis not present

## 2021-06-03 DIAGNOSIS — I2693 Single subsegmental pulmonary embolism without acute cor pulmonale: Secondary | ICD-10-CM | POA: Diagnosis not present

## 2021-06-03 DIAGNOSIS — Z79899 Other long term (current) drug therapy: Secondary | ICD-10-CM | POA: Insufficient documentation

## 2021-06-03 DIAGNOSIS — Z803 Family history of malignant neoplasm of breast: Secondary | ICD-10-CM | POA: Insufficient documentation

## 2021-06-03 DIAGNOSIS — Z86718 Personal history of other venous thrombosis and embolism: Secondary | ICD-10-CM | POA: Insufficient documentation

## 2021-06-03 DIAGNOSIS — I2699 Other pulmonary embolism without acute cor pulmonale: Secondary | ICD-10-CM | POA: Diagnosis not present

## 2021-06-03 DIAGNOSIS — I824Z2 Acute embolism and thrombosis of unspecified deep veins of left distal lower extremity: Secondary | ICD-10-CM | POA: Diagnosis not present

## 2021-06-03 DIAGNOSIS — G8929 Other chronic pain: Secondary | ICD-10-CM | POA: Diagnosis not present

## 2021-06-03 DIAGNOSIS — N400 Enlarged prostate without lower urinary tract symptoms: Secondary | ICD-10-CM | POA: Diagnosis not present

## 2021-06-03 DIAGNOSIS — Z8719 Personal history of other diseases of the digestive system: Secondary | ICD-10-CM | POA: Insufficient documentation

## 2021-06-03 DIAGNOSIS — Z8249 Family history of ischemic heart disease and other diseases of the circulatory system: Secondary | ICD-10-CM

## 2021-06-03 LAB — ANTITHROMBIN III: AntiThromb III Func: 99 % (ref 75–120)

## 2021-06-03 NOTE — Progress Notes (Signed)
[name] Consult   Requesting MD: [name], Md [place],  [place] [address]   [name], MD 60 y.o.  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  The dyspnea progressed over the next 4-5 days and he developed left leg swelling.  He noted an oxygen saturation of approximately 80% on a pulse oximetry check.  His symptoms persisted.  He presented to the [facility] on 05/06/2021. A left lower extremity Doppler was poplar for acute to subacute occlusive DVT in the peroneal vein of the calf, incompletely occlusive DVT popliteal vein, and in the distal thigh femoral vein.  A CT chest revealed multiple branching central pulmonary artery filling defects in multiple segments in the bilateral lower and right upper lobes.  He was started on Xarelto anticoagulation.  He reports the dyspnea improved approximately 1 week after beginning Xarelto.  Left leg pain also improved.  He has persistent swelling of the left leg.  Dr. Sherral Hammers has no previous history of venous or arterial thromboembolic disease.  A brother was diagnosed with an unprovoked DVT/pulmonary embolism in approximately 2019.  He was taken off of anticoagulation therapy for planned hip surgery and developed recurrent DVT/PE.  No other family history of venous or arterial thromboembolic disease.  Dr. Sherral Hammers otherwise feels well.  He was placed on Lovenox prophylaxis following hip replacement surgery and Xarelto prophylaxis following total knee replacement surgery and did not develop venous thrombosis.  No prolonged immobility.  No trauma to the left leg.  No recent surgery.  He last underwent a colonoscopy in August 2018.  No polyps.  Past Medical History:  Diagnosis Date   Anxiety    Aortic atherosclerosis (HCC)    BPH (benign prostatic hyperplasia)    Chronic pain  syndrome    neck and low back   DDD (degenerative disc disease), cervical    Diverticulosis    DJD (degenerative joint disease)    Ganglion cyst    12 mm , right posterior knee   Hyperlipidemia    Internal hemorrhoids    Migraines    OA (osteoarthritis)    knees, hands, right shoulder   Positive QuantiFERON-TB Gold test    Shoulder pain    Tear of right rotator cuff    Type 2 diabetes mellitus ([place])    last A1c 5.4 on 04-13-2018 in epic (followed by pcp)   Wears glasses     Past Surgical History:  Procedure Laterality Date   ANAL FISSURE REPAIR     ARTHOSCOPIC ROTAOR CUFF REPAIR Right 06/15/2018   Procedure: RIGHT SHOULDER ARTHROSCOPY, DEBRIDEMENT, SUBACROMIAL DECOMPRESSION, DISTAL CLAVICLE RESECTION, ROTATOR CUFF REPAIR;  Surgeon: [name], MD;  Location: [facility];  Service: Orthopedics;  Laterality: Right;   COLONOSCOPY  05-26-2017   dr Henrene Pastor   Hip Resurface  2006   KNEE ARTHROSCOPY W/ MENISCAL REPAIR Bilateral right 1993; left 2010   MASS EXCISION Right 04/21/2018   Procedure: EXCISION RIGHT THIGH SOFT TISSUE MASS;  Surgeon: [name], MD;  Location: [facility];  Service: Orthopedics;  Laterality: Right;   SEPTOPLASTY     SHOULDER ARTHROSCOPY WITH ROTATOR CUFF REPAIR Left    SLAP Tear Repair  2008   TONSILLECTOMY     TOTAL KNEE ARTHROPLASTY Left 12/08/2016   Procedure: LEFT TOTAL KNEE ARTHROPLASTY;  Surgeon: [name], MD;  Location: [facility];  Service: Orthopedics;  Laterality: Left;   TOTAL KNEE ARTHROPLASTY WITH REVISION COMPONENTS Left 04/21/2018   Procedure: Left knee polyethylene exchange ;  Surgeon: [name], MD;  Location: [facility];  Service: Orthopedics;  Laterality: Left;    Medications: Reviewed  Allergies:  Allergies  Allergen Reactions   Benzoin Compound Other (See Comments)    Blisters     Family history: His mother had breast cancer.  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  He does not use cigarettes.  He drinks alcohol occasionally.  He has received transfusions with knee surgery.  No risk factor for HIV or hepatitis.  He has received COVID-19 vaccines.  ROS:   Positives include: Left knee pain, chronic neck and back pain, intermittent migraine headaches  A complete ROS was otherwise negative.  Physical Exam:  Blood pressure 130/78, pulse 100, temperature 97.8 F (36.6 C), temperature source Oral, resp. rate 20, height 6' (1.829 m), weight 299 lb 3.2 oz (135.7 kg), SpO2 96 %.  HEENT: Oropharynx without visible mass, neck without mass Lungs: Clear bilaterally Cardiac: Regular rate and rhythm Abdomen: No hepatosplenomegaly  Vascular: 1+ edema the left leg below the knee Lymph nodes: No cervical, supraclavicular, axillary, or inguinal nodes Neurologic: Alert and oriented, the motor exam appears intact in the upper and lower extremities bilaterally Skin: No rash Musculoskeletal: No spine tenderness   LAB:  CBC  Lab Results  Component Value Date   WBC 9.5 05/06/2021   HGB 14.8 05/06/2021   HCT 44.9 05/06/2021   MCV 91.6 05/06/2021   PLT 331 05/06/2021   NEUTROABS 6.3 05/06/2021        CMP  Lab Results  Component Value Date   NA 140 05/06/2021   K 3.8 05/06/2021   CL 107 05/06/2021   CO2 23 05/06/2021   GLUCOSE 95 05/06/2021   BUN 12 05/06/2021   CREATININE 0.84 05/06/2021   CALCIUM 8.6 (L) 05/06/2021   PROT 6.9 05/06/2021   ALBUMIN 3.8 05/06/2021   AST 14 (L) 05/06/2021   ALT 18 05/06/2021   ALKPHOS 61 05/06/2021   BILITOT 0.4 05/06/2021   GFRNONAA >60 05/06/2021   GFRAA 95 11/12/2020     Imaging: As per HPI, CT images from 05/06/2021-reviewed   Assessment/Plan:   Left leg DVT, multiple bilateral pulmonary emboli 05/06/2021 Left leg Doppler 05/06/2021-acute to subacute occlusive DVT in the left peroneal veins of the calf, incompletely occlusive DVT in the popliteal vein and  distal thigh femoral vein CT chest 05/06/2021-acute pulmonary embolism affecting multiple segmental branches bilaterally Xarelto anticoagulation starting 05/06/2021 Diabetes Migraine headaches Family history of venous thromboembolic disease, brother with recurrent DVT/PE Chronic neck/lower back pain Status post multiple orthopedic procedures including a left knee replacement, chronic pain in the left knee BPH Hyperlipidemia   Disposition:   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  He had the onset of dyspnea a few days after returning from a trip to [place].  It is possible the plane trip contributed to the venous thrombosis, but the trip was not of an extremely prolonged duration.  However it is possible the plane trip in conjunction with obesity is responsible for the venous thrombosis.  He has no other apparent acquired risk factor for venous thrombosis such as recent surgery, malignancy, or a myeloproliferative disorder.  He has a significant family history of venous thromboembolic disease in his brother.  We submitted hypercoagulation testing today for inherited and acquired hypercoagulation syndromes.  I feel the venous thrombosis in his case is most likely related to obesity and potentially complicated by the air travel.  My initial plan is to recommend indefinite anticoagulation therapy.  We also discussed reduced dose chronic anticoagulation, coming off of anticoagulation therapy with D-dimer monitoring, and discontinuing anticoagulation therapy if he were to lose a significant amount of weight.  He will continue Xarelto and return for an office visit in 4 months.   I will refer Dr. Sherral Hammers to Dr. Joan Flores at [facility] to get his opinion regarding the indication for indefinite anticoagulation therapy.   Betsy Coder, MD  06/03/2021, 1:52 PM

## 2021-06-04 LAB — HOMOCYSTEINE: Homocysteine: 15.8 umol/L (ref 0.0–17.2)

## 2021-06-05 ENCOUNTER — Telehealth: Payer: Self-pay

## 2021-06-05 LAB — BETA-2-GLYCOPROTEIN I ABS, IGG/M/A
Beta-2 Glyco I IgG: <9 GPI IgG units (ref 0–20)
Beta-2-Glycoprotein I IgA: <9 GPI IgA units (ref 0–25)
Beta-2-Glycoprotein I IgM: <9 GPI IgM units (ref 0–32)

## 2021-06-05 LAB — PROTEIN C, TOTAL: Protein C, Total: 111 % (ref 60–150)

## 2021-06-05 NOTE — Telephone Encounter (Signed)
Per Dr Benay Spice TC to Pt to inquire if elevated PSA has been evaluated by a urologist. No answer V/m message left to return call.

## 2021-06-06 DIAGNOSIS — Z96652 Presence of left artificial knee joint: Secondary | ICD-10-CM | POA: Diagnosis not present

## 2021-06-06 LAB — CARDIOLIPIN ANTIBODIES, IGG, IGM, IGA
Anticardiolipin IgA: <9 APL U/mL (ref 0–11)
Anticardiolipin IgG: 17 GPL U/mL — ABNORMAL HIGH (ref 0–14)
Anticardiolipin IgM: <9 MPL U/mL (ref 0–12)

## 2021-06-08 LAB — PROTEIN S ACTIVITY: Protein S Activity: 79 % (ref 63–140)

## 2021-06-08 LAB — LUPUS ANTICOAGULANT PANEL
DRVVT: 48.2 s — ABNORMAL HIGH (ref 0.0–47.0)
PTT Lupus Anticoagulant: 40.2 s (ref 0.0–51.9)

## 2021-06-08 LAB — DRVVT MIX: dRVVT Mix: 42.2 s — ABNORMAL HIGH (ref 0.0–40.4)

## 2021-06-08 LAB — PROTEIN S, TOTAL: Protein S Ag, Total: 118 % (ref 60–150)

## 2021-06-08 LAB — PROTEIN C ACTIVITY: Protein C Activity: 136 % (ref 73–180)

## 2021-06-08 LAB — DRVVT CONFIRM: dRVVT Confirm: 1.3 ratio — ABNORMAL HIGH (ref 0.8–1.2)

## 2021-06-10 DIAGNOSIS — R972 Elevated prostate specific antigen [PSA]: Secondary | ICD-10-CM | POA: Diagnosis not present

## 2021-06-10 DIAGNOSIS — N5201 Erectile dysfunction due to arterial insufficiency: Secondary | ICD-10-CM | POA: Diagnosis not present

## 2021-06-10 DIAGNOSIS — R351 Nocturia: Secondary | ICD-10-CM | POA: Diagnosis not present

## 2021-06-11 LAB — FACTOR 5 LEIDEN

## 2021-06-11 LAB — PROTHROMBIN GENE MUTATION

## 2021-06-12 ENCOUNTER — Telehealth: Payer: Self-pay

## 2021-06-12 NOTE — Telephone Encounter (Signed)
Follow up call to Pt inquiring if he had his elevated PSA evaluated. Pt confirmed he did see a urologist and asked him to send information to Dr Benay Spice.

## 2021-06-19 ENCOUNTER — Encounter: Payer: Self-pay | Admitting: *Deleted

## 2021-06-19 NOTE — Progress Notes (Signed)
Faxed referral order, demographics and new patient office note to Dr. Gareth Morgan at [facility] [phone]. Noted that records/labs are in [place].

## 2021-06-27 ENCOUNTER — Telehealth: Payer: Self-pay

## 2021-06-27 DIAGNOSIS — M9902 Segmental and somatic dysfunction of thoracic region: Secondary | ICD-10-CM | POA: Diagnosis not present

## 2021-06-27 DIAGNOSIS — M9903 Segmental and somatic dysfunction of lumbar region: Secondary | ICD-10-CM | POA: Diagnosis not present

## 2021-06-27 DIAGNOSIS — M9904 Segmental and somatic dysfunction of sacral region: Secondary | ICD-10-CM | POA: Diagnosis not present

## 2021-06-27 DIAGNOSIS — M9901 Segmental and somatic dysfunction of cervical region: Secondary | ICD-10-CM | POA: Diagnosis not present

## 2021-06-27 NOTE — Telephone Encounter (Signed)
TC to Pt relayed message from Dr. Benay Spice. Informed to follow up with Dr Joan Flores for consultation and with [facility] and continue Xarelto. Pt verbalized understanding and confirmed appointment on 10/03/21.

## 2021-06-27 NOTE — Telephone Encounter (Signed)
-----   Message from Ladell Pier, MD sent at 06/26/2021  3:59 PM EDT ----- Please call patient, the hypercoagulation panel does not reveal an explanation for the venous thrombosis The positive lupus anticoagulant is likely an effect of anticoagulation therapy, the mildly elevated anticardiolipin IgG antibody is nonspecific and most likely does not indicate antiphospholipid syndrome  Follow-up for consultation with Dr. Joan Flores and here as scheduled.  Continue Xarelto

## 2021-07-01 ENCOUNTER — Other Ambulatory Visit: Payer: Self-pay

## 2021-07-01 ENCOUNTER — Other Ambulatory Visit (HOSPITAL_COMMUNITY): Payer: Self-pay

## 2021-07-01 MED ORDER — TADALAFIL 5 MG PO TABS
5.0000 mg | ORAL_TABLET | Freq: Every day | ORAL | 6 refills | Status: DC
  Filled 2021-07-01: qty 30, 30d supply, fill #0
  Filled 2021-08-05: qty 30, 30d supply, fill #1
  Filled 2021-09-02: qty 30, 30d supply, fill #2
  Filled 2021-12-27: qty 30, 30d supply, fill #3

## 2021-07-01 MED ORDER — METHOCARBAMOL 500 MG PO TABS
500.0000 mg | ORAL_TABLET | Freq: Three times a day (TID) | ORAL | 0 refills | Status: DC | PRN
  Filled 2021-07-01: qty 90, 30d supply, fill #0

## 2021-07-01 MED ORDER — OXYCODONE HCL 5 MG PO TABS
5.0000 mg | ORAL_TABLET | Freq: Three times a day (TID) | ORAL | 0 refills | Status: DC | PRN
  Filled 2021-07-01: qty 180, 30d supply, fill #0

## 2021-07-01 MED ORDER — TOPIRAMATE 25 MG PO TABS
25.0000 mg | ORAL_TABLET | Freq: Every day | ORAL | 0 refills | Status: DC
  Filled 2021-07-01: qty 30, 30d supply, fill #0

## 2021-07-01 MED ORDER — METFORMIN HCL 1000 MG PO TABS
1000.0000 mg | ORAL_TABLET | Freq: Two times a day (BID) | ORAL | 3 refills | Status: DC
  Filled 2021-07-01: qty 180, 90d supply, fill #0
  Filled 2021-09-30: qty 180, 90d supply, fill #1
  Filled 2021-12-27: qty 180, 90d supply, fill #2
  Filled 2022-03-31: qty 180, 90d supply, fill #3

## 2021-07-01 MED FILL — Pantoprazole Sodium EC Tab 40 MG (Base Equiv): ORAL | 90 days supply | Qty: 90 | Fill #1 | Status: AC

## 2021-07-01 MED FILL — Gabapentin Cap 300 MG: ORAL | 90 days supply | Qty: 270 | Fill #1 | Status: AC

## 2021-07-01 MED FILL — Tamsulosin HCl Cap 0.4 MG: ORAL | 90 days supply | Qty: 180 | Fill #1 | Status: AC

## 2021-07-03 ENCOUNTER — Other Ambulatory Visit (HOSPITAL_COMMUNITY): Payer: Self-pay

## 2021-07-03 DIAGNOSIS — R972 Elevated prostate specific antigen [PSA]: Secondary | ICD-10-CM | POA: Diagnosis not present

## 2021-07-04 ENCOUNTER — Other Ambulatory Visit (HOSPITAL_COMMUNITY): Payer: Self-pay

## 2021-07-04 MED ORDER — BUTALBITAL-APAP-CAFFEINE 50-325-40 MG PO TABS
1.0000 | ORAL_TABLET | ORAL | 0 refills | Status: DC | PRN
  Filled 2021-07-04: qty 180, 30d supply, fill #0

## 2021-07-04 MED ORDER — DIAZEPAM 5 MG PO TABS
5.0000 mg | ORAL_TABLET | Freq: Two times a day (BID) | ORAL | 3 refills | Status: DC
  Filled 2021-07-04: qty 60, 30d supply, fill #0
  Filled 2021-08-05: qty 60, 30d supply, fill #1
  Filled 2021-09-02: qty 60, 30d supply, fill #2
  Filled 2021-09-30: qty 60, 30d supply, fill #3

## 2021-07-04 MED ORDER — PRAVASTATIN SODIUM 40 MG PO TABS
40.0000 mg | ORAL_TABLET | Freq: Every day | ORAL | 3 refills | Status: DC
  Filled 2021-07-04: qty 90, 90d supply, fill #0
  Filled 2021-09-30: qty 90, 90d supply, fill #1
  Filled 2021-12-30: qty 90, 90d supply, fill #2

## 2021-07-05 ENCOUNTER — Other Ambulatory Visit (HOSPITAL_BASED_OUTPATIENT_CLINIC_OR_DEPARTMENT_OTHER): Payer: Self-pay

## 2021-07-05 ENCOUNTER — Other Ambulatory Visit (HOSPITAL_COMMUNITY): Payer: Self-pay

## 2021-07-08 ENCOUNTER — Other Ambulatory Visit (HOSPITAL_COMMUNITY): Payer: Self-pay

## 2021-07-09 ENCOUNTER — Telehealth: Payer: Self-pay | Admitting: *Deleted

## 2021-07-09 NOTE — Telephone Encounter (Addendum)
Calling to inquire when he needs to hold his Xarelto prior to a prostate biopsy procedure and when to resume? Per Dr. Benay Spice: Hold Xarelto day before and day of procedure and discuss w/urology when to resume. Patient notified.

## 2021-07-10 ENCOUNTER — Other Ambulatory Visit (HOSPITAL_COMMUNITY): Payer: Self-pay

## 2021-07-10 MED ORDER — AMOXICILLIN-POT CLAVULANATE 875-125 MG PO TABS
1.0000 | ORAL_TABLET | Freq: Two times a day (BID) | ORAL | 0 refills | Status: DC
  Filled 2021-07-10: qty 10, 5d supply, fill #0

## 2021-07-11 ENCOUNTER — Other Ambulatory Visit (HOSPITAL_COMMUNITY): Payer: Self-pay

## 2021-07-11 MED ORDER — COMBIVENT RESPIMAT 20-100 MCG/ACT IN AERS
1.0000 | INHALATION_SPRAY | Freq: Four times a day (QID) | RESPIRATORY_TRACT | 0 refills | Status: DC
  Filled 2021-07-11: qty 1, 30d supply, fill #0

## 2021-07-11 MED ORDER — GUAIFENESIN-CODEINE 100-10 MG/5ML PO SOLN
10.0000 mL | ORAL | 0 refills | Status: DC
  Filled 2021-07-11: qty 300, 5d supply, fill #0

## 2021-07-11 MED ORDER — SPIROMETER KIT
PACK | 0 refills | Status: AC
  Filled 2021-07-11: qty 1, 1d supply, fill #0

## 2021-07-11 MED ORDER — LAGEVRIO 200 MG PO CAPS
4.0000 | ORAL_CAPSULE | Freq: Two times a day (BID) | ORAL | 0 refills | Status: DC
  Filled 2021-07-11: qty 40, 5d supply, fill #0

## 2021-07-11 MED ORDER — NIRMATRELVIR&RITONAVIR 300/100 20 X 150 MG & 10 X 100MG PO TBPK
ORAL_TABLET | ORAL | 0 refills | Status: DC
  Filled 2021-07-11: qty 30, 5d supply, fill #0

## 2021-07-11 MED ORDER — ONDANSETRON HCL 4 MG PO TABS
4.0000 mg | ORAL_TABLET | Freq: Three times a day (TID) | ORAL | 0 refills | Status: AC
  Filled 2021-07-11: qty 30, 10d supply, fill #0

## 2021-07-11 MED ORDER — PREDNISONE 50 MG PO TABS
50.0000 mg | ORAL_TABLET | Freq: Every day | ORAL | 0 refills | Status: DC
  Filled 2021-07-11: qty 5, 5d supply, fill #0

## 2021-07-22 ENCOUNTER — Other Ambulatory Visit (HOSPITAL_COMMUNITY): Payer: Self-pay

## 2021-07-22 MED ORDER — SULFAMETHOXAZOLE-TRIMETHOPRIM 800-160 MG PO TABS
1.0000 | ORAL_TABLET | Freq: Two times a day (BID) | ORAL | 0 refills | Status: DC
  Filled 2021-07-22: qty 2, 1d supply, fill #0

## 2021-07-30 ENCOUNTER — Telehealth: Payer: Self-pay | Admitting: *Deleted

## 2021-07-30 NOTE — Telephone Encounter (Signed)
F/U with Dr. Lorita Officer office re: referral. They reached out to him on 9/12 and 9/15 and he did not return call. Spoke w/patient and he plans to call for appointment after his surgery w/urology. Surgery on 11/10 with f/u on 11/17. He has their phone # saved on his phone.

## 2021-07-31 ENCOUNTER — Telehealth: Payer: Self-pay | Admitting: *Deleted

## 2021-07-31 NOTE — Telephone Encounter (Signed)
Patient called requesting a discussion with Dr. Benay Spice re: 4 days of bright red blood in stool. Wants to hold his Xarelto for 24-72 hours.

## 2021-08-02 ENCOUNTER — Telehealth: Payer: Self-pay | Admitting: *Deleted

## 2021-08-02 NOTE — Telephone Encounter (Signed)
Held Xarelto for 48 hours and it appears the GI bleed resolved. Resumed 20 mg daily today. Does MD want to continue this dose or reduce dose?

## 2021-08-05 ENCOUNTER — Other Ambulatory Visit (HOSPITAL_COMMUNITY): Payer: Self-pay

## 2021-08-05 ENCOUNTER — Telehealth: Payer: Self-pay | Admitting: *Deleted

## 2021-08-05 DIAGNOSIS — M9904 Segmental and somatic dysfunction of sacral region: Secondary | ICD-10-CM | POA: Diagnosis not present

## 2021-08-05 DIAGNOSIS — M9902 Segmental and somatic dysfunction of thoracic region: Secondary | ICD-10-CM | POA: Diagnosis not present

## 2021-08-05 DIAGNOSIS — M9903 Segmental and somatic dysfunction of lumbar region: Secondary | ICD-10-CM | POA: Diagnosis not present

## 2021-08-05 DIAGNOSIS — M9901 Segmental and somatic dysfunction of cervical region: Secondary | ICD-10-CM | POA: Diagnosis not present

## 2021-08-05 MED ORDER — COMBIVENT RESPIMAT 20-100 MCG/ACT IN AERS
1.0000 | INHALATION_SPRAY | Freq: Four times a day (QID) | RESPIRATORY_TRACT | 3 refills | Status: DC | PRN
  Filled 2021-08-05: qty 4, 30d supply, fill #0
  Filled 2021-09-02: qty 4, 30d supply, fill #1
  Filled 2021-09-30: qty 4, 30d supply, fill #2
  Filled 2021-12-27: qty 4, 30d supply, fill #3

## 2021-08-05 MED ORDER — TOPIRAMATE 25 MG PO TABS
25.0000 mg | ORAL_TABLET | Freq: Every evening | ORAL | 3 refills | Status: DC
  Filled 2021-08-05: qty 30, 30d supply, fill #0
  Filled 2021-09-02: qty 30, 30d supply, fill #1
  Filled 2021-09-30: qty 30, 30d supply, fill #2
  Filled 2021-12-12: qty 30, 30d supply, fill #3

## 2021-08-05 MED ORDER — INFLUENZA VAC SPLIT QUAD 0.5 ML IM SUSY
0.5000 mL | PREFILLED_SYRINGE | INTRAMUSCULAR | 0 refills | Status: DC
  Filled 2021-08-05: qty 0.5, 1d supply, fill #0

## 2021-08-05 MED FILL — Dicyclomine HCl Tab 20 MG: ORAL | 15 days supply | Qty: 60 | Fill #1 | Status: AC

## 2021-08-05 NOTE — Telephone Encounter (Signed)
Per Dr. Benay Spice: Since still having some bleeding, hold xarelto and get in to be seen by GI this week. IF they can't see him this week, need to come in Wednesday to get started on Lovenox.

## 2021-08-05 NOTE — Telephone Encounter (Signed)
Per Dr. Benay Spice: Resume xarelto at standard dose (20 mg qd) and call if bleeding returns. Left this message on his VM.

## 2021-08-05 NOTE — Telephone Encounter (Signed)
Patient called back and agrees to reach out to GI about his bleeding, which he thinks it is from his hemorrhoids. Tells RN it is very mild bleeding when he has BM. He will not have ability to see GI until the week of 11/7 due to missing too much work and having other MD visits. He is willing to take Lovenox if MD feels he needs it--has self administered this in past. Would prefer to discuss this with MD with a phone call.

## 2021-08-05 NOTE — Telephone Encounter (Signed)
Patient returned call and wants MD aware that he is still having some bright red blood in stool, but much less. Started taking his hemorrhoid suppository again. Requested RN ask MD again if he really wants him to take 20 mg qd>

## 2021-08-06 ENCOUNTER — Other Ambulatory Visit (HOSPITAL_COMMUNITY): Payer: Self-pay

## 2021-08-19 DIAGNOSIS — M9902 Segmental and somatic dysfunction of thoracic region: Secondary | ICD-10-CM | POA: Diagnosis not present

## 2021-08-19 DIAGNOSIS — M9903 Segmental and somatic dysfunction of lumbar region: Secondary | ICD-10-CM | POA: Diagnosis not present

## 2021-08-19 DIAGNOSIS — M9904 Segmental and somatic dysfunction of sacral region: Secondary | ICD-10-CM | POA: Diagnosis not present

## 2021-08-19 DIAGNOSIS — M9901 Segmental and somatic dysfunction of cervical region: Secondary | ICD-10-CM | POA: Diagnosis not present

## 2021-08-22 DIAGNOSIS — R972 Elevated prostate specific antigen [PSA]: Secondary | ICD-10-CM | POA: Diagnosis not present

## 2021-08-28 ENCOUNTER — Other Ambulatory Visit: Payer: Self-pay

## 2021-08-28 ENCOUNTER — Other Ambulatory Visit (HOSPITAL_COMMUNITY): Payer: Self-pay | Admitting: Internal Medicine

## 2021-08-28 ENCOUNTER — Ambulatory Visit (HOSPITAL_COMMUNITY)
Admission: RE | Admit: 2021-08-28 | Discharge: 2021-08-28 | Disposition: A | Discharge status: Home / Self Care | Payer: 59 | Source: Ambulatory Visit | Attending: Internal Medicine | Admitting: Internal Medicine

## 2021-08-28 DIAGNOSIS — I82432 Acute embolism and thrombosis of left popliteal vein: Secondary | ICD-10-CM | POA: Diagnosis not present

## 2021-08-29 ENCOUNTER — Other Ambulatory Visit (HOSPITAL_COMMUNITY): Payer: Self-pay

## 2021-08-29 ENCOUNTER — Other Ambulatory Visit (HOSPITAL_BASED_OUTPATIENT_CLINIC_OR_DEPARTMENT_OTHER): Payer: Self-pay

## 2021-08-29 ENCOUNTER — Ambulatory Visit: Payer: 59 | Attending: Internal Medicine

## 2021-08-29 DIAGNOSIS — Z23 Encounter for immunization: Secondary | ICD-10-CM

## 2021-08-29 DIAGNOSIS — R351 Nocturia: Secondary | ICD-10-CM | POA: Diagnosis not present

## 2021-08-29 DIAGNOSIS — R972 Elevated prostate specific antigen [PSA]: Secondary | ICD-10-CM | POA: Diagnosis not present

## 2021-08-29 DIAGNOSIS — N5201 Erectile dysfunction due to arterial insufficiency: Secondary | ICD-10-CM | POA: Diagnosis not present

## 2021-08-29 MED ORDER — PFIZER COVID-19 VAC BIVALENT 30 MCG/0.3ML IM SUSP
INTRAMUSCULAR | 0 refills | Status: DC
  Filled 2021-08-29: qty 0.3, 1d supply, fill #0

## 2021-08-29 MED ORDER — FINASTERIDE 5 MG PO TABS
5.0000 mg | ORAL_TABLET | Freq: Every day | ORAL | 3 refills | Status: DC
  Filled 2021-08-29: qty 90, 90d supply, fill #0
  Filled 2021-12-12: qty 90, 90d supply, fill #1
  Filled 2022-03-05: qty 90, 90d supply, fill #2
  Filled 2022-06-09: qty 90, 90d supply, fill #3

## 2021-08-29 NOTE — Progress Notes (Signed)
   Covid-19 Vaccination Clinic  Name:  JAYMERE ALEN, MD    MRN: [id number] DOB: [birth year]  08/29/2021  Mr. Wease was observed post Covid-19 immunization for 15 minutes without incident. He was provided with Vaccine Information Sheet and instruction to access the V-Safe system.   Mr. Capozzi was instructed to call 911 with any severe reactions post vaccine: Difficulty breathing  Swelling of face and throat  A fast heartbeat  A bad rash all over body  Dizziness and weakness   Immunizations Administered     Name Date Dose VIS Date Route   Pfizer Covid-19 Vaccine Bivalent Booster 08/29/2021 12:47 PM 0.3 mL 06/12/2021 Intramuscular   Manufacturer: [place]   Lot: [id number]   [place]: [phone]

## 2021-08-30 ENCOUNTER — Other Ambulatory Visit (HOSPITAL_COMMUNITY): Payer: Self-pay

## 2021-08-30 MED ORDER — GABAPENTIN 100 MG PO CAPS
200.0000 mg | ORAL_CAPSULE | Freq: Three times a day (TID) | ORAL | 1 refills | Status: DC
  Filled 2021-08-30: qty 540, 90d supply, fill #0

## 2021-09-02 ENCOUNTER — Other Ambulatory Visit (HOSPITAL_COMMUNITY): Payer: Self-pay

## 2021-09-02 DIAGNOSIS — M722 Plantar fascial fibromatosis: Secondary | ICD-10-CM | POA: Diagnosis not present

## 2021-09-02 MED ORDER — TADALAFIL 5 MG PO TABS
5.0000 mg | ORAL_TABLET | Freq: Every day | ORAL | 2 refills | Status: DC
  Filled 2021-09-02 – 2021-09-30 (×3): qty 90, 90d supply, fill #0
  Filled 2022-02-06: qty 90, 90d supply, fill #1
  Filled 2022-05-02 – 2022-05-08 (×3): qty 90, 90d supply, fill #2

## 2021-09-02 MED FILL — Dicyclomine HCl Tab 20 MG: ORAL | 15 days supply | Qty: 60 | Fill #2 | Status: AC

## 2021-09-03 ENCOUNTER — Other Ambulatory Visit (HOSPITAL_COMMUNITY): Payer: Self-pay

## 2021-09-06 ENCOUNTER — Other Ambulatory Visit (HOSPITAL_COMMUNITY): Payer: Self-pay

## 2021-09-09 ENCOUNTER — Other Ambulatory Visit (HOSPITAL_COMMUNITY): Payer: Self-pay

## 2021-09-11 ENCOUNTER — Other Ambulatory Visit (HOSPITAL_COMMUNITY): Payer: Self-pay

## 2021-09-12 DIAGNOSIS — M47812 Spondylosis without myelopathy or radiculopathy, cervical region: Secondary | ICD-10-CM | POA: Diagnosis not present

## 2021-09-16 ENCOUNTER — Other Ambulatory Visit (HOSPITAL_COMMUNITY): Payer: Self-pay

## 2021-09-20 ENCOUNTER — Other Ambulatory Visit (HOSPITAL_COMMUNITY): Payer: Self-pay

## 2021-09-23 ENCOUNTER — Other Ambulatory Visit (HOSPITAL_COMMUNITY): Payer: Self-pay

## 2021-09-24 ENCOUNTER — Other Ambulatory Visit (HOSPITAL_COMMUNITY): Payer: Self-pay

## 2021-09-24 DIAGNOSIS — M9903 Segmental and somatic dysfunction of lumbar region: Secondary | ICD-10-CM | POA: Diagnosis not present

## 2021-09-24 DIAGNOSIS — M9904 Segmental and somatic dysfunction of sacral region: Secondary | ICD-10-CM | POA: Diagnosis not present

## 2021-09-24 DIAGNOSIS — M9901 Segmental and somatic dysfunction of cervical region: Secondary | ICD-10-CM | POA: Diagnosis not present

## 2021-09-24 DIAGNOSIS — M9902 Segmental and somatic dysfunction of thoracic region: Secondary | ICD-10-CM | POA: Diagnosis not present

## 2021-09-24 MED ORDER — BUTALBITAL-APAP-CAFFEINE 50-325-40 MG PO TABS
1.0000 | ORAL_TABLET | ORAL | 1 refills | Status: DC | PRN
  Filled 2021-09-24: qty 180, 30d supply, fill #0

## 2021-09-27 ENCOUNTER — Other Ambulatory Visit (HOSPITAL_COMMUNITY): Payer: Self-pay

## 2021-09-30 ENCOUNTER — Other Ambulatory Visit: Payer: Self-pay | Admitting: Internal Medicine

## 2021-09-30 ENCOUNTER — Other Ambulatory Visit (HOSPITAL_COMMUNITY): Payer: Self-pay

## 2021-09-30 MED ORDER — OXYCODONE HCL 5 MG PO TABS
5.0000 mg | ORAL_TABLET | Freq: Three times a day (TID) | ORAL | 0 refills | Status: DC | PRN
  Filled 2021-09-30: qty 180, 30d supply, fill #0

## 2021-09-30 MED ORDER — ZOLPIDEM TARTRATE 10 MG PO TABS
10.0000 mg | ORAL_TABLET | Freq: Every evening | ORAL | 1 refills | Status: DC | PRN
  Filled 2021-09-30 – 2021-10-01 (×2): qty 90, 90d supply, fill #0

## 2021-09-30 MED ORDER — LIDOCAINE 5 % EX PTCH
2.0000 | MEDICATED_PATCH | Freq: Every day | CUTANEOUS | 3 refills | Status: DC
  Filled 2021-09-30: qty 60, 30d supply, fill #0
  Filled 2021-12-12 – 2022-03-31 (×4): qty 60, 30d supply, fill #1
  Filled 2022-05-20: qty 60, 30d supply, fill #2
  Filled 2022-07-09: qty 60, 30d supply, fill #3

## 2021-09-30 MED ORDER — CYCLOBENZAPRINE HCL 10 MG PO TABS
10.0000 mg | ORAL_TABLET | Freq: Three times a day (TID) | ORAL | 6 refills | Status: DC | PRN
  Filled 2021-09-30: qty 270, 90d supply, fill #0
  Filled 2021-12-27: qty 270, 90d supply, fill #1
  Filled 2022-03-31: qty 270, 90d supply, fill #2
  Filled 2022-07-09: qty 270, 90d supply, fill #3

## 2021-09-30 MED ORDER — RIZATRIPTAN BENZOATE 5 MG PO TBDP
ORAL_TABLET | ORAL | 3 refills | Status: DC
  Filled 2021-09-30: qty 15, 30d supply, fill #0
  Filled 2021-12-12: qty 15, 30d supply, fill #1
  Filled 2022-03-31: qty 15, 30d supply, fill #2
  Filled 2022-06-09: qty 15, 30d supply, fill #3

## 2021-09-30 MED ORDER — KETOCONAZOLE 2 % EX CREA
1.0000 "application " | TOPICAL_CREAM | Freq: Two times a day (BID) | CUTANEOUS | 3 refills | Status: DC
  Filled 2021-09-30: qty 30, 15d supply, fill #0
  Filled 2021-12-30: qty 30, 15d supply, fill #1
  Filled 2022-03-31: qty 30, 15d supply, fill #2
  Filled 2022-05-20: qty 30, 15d supply, fill #3

## 2021-09-30 MED ORDER — TAMSULOSIN HCL 0.4 MG PO CAPS
0.4000 mg | ORAL_CAPSULE | Freq: Two times a day (BID) | ORAL | 3 refills | Status: DC
  Filled 2021-09-30: qty 180, 90d supply, fill #0
  Filled 2021-12-27: qty 180, 90d supply, fill #1
  Filled 2022-03-31: qty 180, 90d supply, fill #2
  Filled 2022-07-09: qty 180, 90d supply, fill #3

## 2021-09-30 MED ORDER — METHOCARBAMOL 500 MG PO TABS
500.0000 mg | ORAL_TABLET | Freq: Three times a day (TID) | ORAL | 1 refills | Status: DC | PRN
  Filled 2021-09-30: qty 90, 30d supply, fill #0
  Filled 2021-12-27: qty 90, 30d supply, fill #1

## 2021-09-30 MED ORDER — DICYCLOMINE HCL 20 MG PO TABS
ORAL_TABLET | ORAL | 0 refills | Status: DC
  Filled 2021-09-30: qty 60, 10d supply, fill #0

## 2021-09-30 MED ORDER — BACLOFEN 10 MG PO TABS
10.0000 mg | ORAL_TABLET | Freq: Two times a day (BID) | ORAL | 3 refills | Status: DC | PRN
  Filled 2021-09-30: qty 360, 90d supply, fill #0

## 2021-09-30 MED FILL — Gabapentin Cap 300 MG: ORAL | 90 days supply | Qty: 270 | Fill #2 | Status: CN

## 2021-10-01 ENCOUNTER — Other Ambulatory Visit (HOSPITAL_COMMUNITY): Payer: Self-pay

## 2021-10-01 MED ORDER — GABAPENTIN 600 MG PO TABS
600.0000 mg | ORAL_TABLET | Freq: Three times a day (TID) | ORAL | 1 refills | Status: DC
  Filled 2021-10-01: qty 270, 90d supply, fill #0
  Filled 2021-12-27 – 2021-12-30 (×2): qty 270, 90d supply, fill #1

## 2021-10-01 MED ORDER — PANTOPRAZOLE SODIUM 40 MG PO TBEC
40.0000 mg | DELAYED_RELEASE_TABLET | Freq: Every day | ORAL | 3 refills | Status: DC
  Filled 2021-10-01: qty 90, 90d supply, fill #0

## 2021-10-01 MED ORDER — PANTOPRAZOLE SODIUM 40 MG PO TBEC
40.0000 mg | DELAYED_RELEASE_TABLET | Freq: Every day | ORAL | 3 refills | Status: DC
  Filled 2021-10-01: qty 90, 90d supply, fill #0
  Filled 2021-12-27: qty 90, 90d supply, fill #1
  Filled 2022-03-31: qty 90, 90d supply, fill #2
  Filled 2022-07-09: qty 90, 90d supply, fill #3

## 2021-10-03 ENCOUNTER — Inpatient Hospital Stay: Payer: 59 | Admitting: Oncology

## 2021-10-24 DIAGNOSIS — M5416 Radiculopathy, lumbar region: Secondary | ICD-10-CM | POA: Diagnosis not present

## 2021-10-29 DIAGNOSIS — E1151 Type 2 diabetes mellitus with diabetic peripheral angiopathy without gangrene: Secondary | ICD-10-CM | POA: Diagnosis not present

## 2021-10-29 DIAGNOSIS — Z125 Encounter for screening for malignant neoplasm of prostate: Secondary | ICD-10-CM | POA: Diagnosis not present

## 2021-10-29 DIAGNOSIS — I10 Essential (primary) hypertension: Secondary | ICD-10-CM | POA: Diagnosis not present

## 2021-10-29 DIAGNOSIS — E785 Hyperlipidemia, unspecified: Secondary | ICD-10-CM | POA: Diagnosis not present

## 2021-11-04 ENCOUNTER — Ambulatory Visit (INDEPENDENT_AMBULATORY_CARE_PROVIDER_SITE_OTHER): Payer: 59 | Admitting: Physician Assistant

## 2021-11-04 ENCOUNTER — Encounter: Payer: Self-pay | Admitting: Physician Assistant

## 2021-11-04 ENCOUNTER — Inpatient Hospital Stay (HOSPITAL_COMMUNITY)
Admission: AD | Admit: 2021-11-04 | Discharge: 2021-11-05 | DRG: 394 | Disposition: A | Discharge status: Home / Self Care | Payer: 59 | Source: Ambulatory Visit | Attending: Internal Medicine | Admitting: Internal Medicine

## 2021-11-04 ENCOUNTER — Inpatient Hospital Stay (HOSPITAL_COMMUNITY): Payer: 59

## 2021-11-04 ENCOUNTER — Telehealth: Payer: Self-pay | Admitting: Internal Medicine

## 2021-11-04 ENCOUNTER — Encounter (HOSPITAL_COMMUNITY): Payer: Self-pay | Admitting: Internal Medicine

## 2021-11-04 VITALS — BP 140/88 | HR 106 | Ht 73.0 in | Wt 319.4 lb

## 2021-11-04 DIAGNOSIS — R1032 Left lower quadrant pain: Secondary | ICD-10-CM

## 2021-11-04 DIAGNOSIS — I700 Atherosclerosis of aorta: Secondary | ICD-10-CM | POA: Diagnosis present

## 2021-11-04 DIAGNOSIS — M1990 Unspecified osteoarthritis, unspecified site: Secondary | ICD-10-CM | POA: Diagnosis present

## 2021-11-04 DIAGNOSIS — E785 Hyperlipidemia, unspecified: Secondary | ICD-10-CM | POA: Diagnosis not present

## 2021-11-04 DIAGNOSIS — Z79899 Other long term (current) drug therapy: Secondary | ICD-10-CM

## 2021-11-04 DIAGNOSIS — F419 Anxiety disorder, unspecified: Secondary | ICD-10-CM | POA: Diagnosis not present

## 2021-11-04 DIAGNOSIS — K6289 Other specified diseases of anus and rectum: Secondary | ICD-10-CM | POA: Diagnosis not present

## 2021-11-04 DIAGNOSIS — M5030 Other cervical disc degeneration, unspecified cervical region: Secondary | ICD-10-CM | POA: Diagnosis present

## 2021-11-04 DIAGNOSIS — Z7984 Long term (current) use of oral hypoglycemic drugs: Secondary | ICD-10-CM | POA: Diagnosis not present

## 2021-11-04 DIAGNOSIS — G43909 Migraine, unspecified, not intractable, without status migrainosus: Secondary | ICD-10-CM | POA: Diagnosis present

## 2021-11-04 DIAGNOSIS — K921 Melena: Secondary | ICD-10-CM | POA: Diagnosis present

## 2021-11-04 DIAGNOSIS — R109 Unspecified abdominal pain: Secondary | ICD-10-CM | POA: Diagnosis present

## 2021-11-04 DIAGNOSIS — K644 Residual hemorrhoidal skin tags: Secondary | ICD-10-CM | POA: Diagnosis present

## 2021-11-04 DIAGNOSIS — K645 Perianal venous thrombosis: Secondary | ICD-10-CM | POA: Diagnosis not present

## 2021-11-04 DIAGNOSIS — Z8249 Family history of ischemic heart disease and other diseases of the circulatory system: Secondary | ICD-10-CM

## 2021-11-04 DIAGNOSIS — Z20822 Contact with and (suspected) exposure to covid-19: Secondary | ICD-10-CM | POA: Diagnosis present

## 2021-11-04 DIAGNOSIS — K529 Noninfective gastroenteritis and colitis, unspecified: Secondary | ICD-10-CM

## 2021-11-04 DIAGNOSIS — E119 Type 2 diabetes mellitus without complications: Secondary | ICD-10-CM | POA: Diagnosis present

## 2021-11-04 DIAGNOSIS — K648 Other hemorrhoids: Secondary | ICD-10-CM | POA: Diagnosis not present

## 2021-11-04 DIAGNOSIS — G894 Chronic pain syndrome: Secondary | ICD-10-CM | POA: Diagnosis present

## 2021-11-04 DIAGNOSIS — Z7901 Long term (current) use of anticoagulants: Secondary | ICD-10-CM | POA: Diagnosis not present

## 2021-11-04 DIAGNOSIS — K5909 Other constipation: Secondary | ICD-10-CM | POA: Diagnosis present

## 2021-11-04 DIAGNOSIS — N400 Enlarged prostate without lower urinary tract symptoms: Secondary | ICD-10-CM | POA: Diagnosis present

## 2021-11-04 DIAGNOSIS — K922 Gastrointestinal hemorrhage, unspecified: Principal | ICD-10-CM

## 2021-11-04 DIAGNOSIS — Z888 Allergy status to other drugs, medicaments and biological substances status: Secondary | ICD-10-CM

## 2021-11-04 DIAGNOSIS — K625 Hemorrhage of anus and rectum: Secondary | ICD-10-CM | POA: Diagnosis not present

## 2021-11-04 DIAGNOSIS — Z96652 Presence of left artificial knee joint: Secondary | ICD-10-CM | POA: Diagnosis not present

## 2021-11-04 DIAGNOSIS — Z86718 Personal history of other venous thrombosis and embolism: Secondary | ICD-10-CM | POA: Diagnosis not present

## 2021-11-04 LAB — COMPREHENSIVE METABOLIC PANEL
ALT: 30 U/L (ref 0–44)
AST: 21 U/L (ref 15–41)
Albumin: 3.9 g/dL (ref 3.5–5.0)
Alkaline Phosphatase: 72 U/L (ref 38–126)
Anion gap: 7 (ref 5–15)
BUN: 20 mg/dL (ref 8–23)
CO2: 24 mmol/L (ref 22–32)
Calcium: 8.7 mg/dL — ABNORMAL LOW (ref 8.9–10.3)
Chloride: 108 mmol/L (ref 98–111)
Creatinine, Ser: 0.97 mg/dL (ref 0.61–1.24)
GFR, Estimated: >60 mL/min (ref >60)
Glucose, Bld: 112 mg/dL — ABNORMAL HIGH (ref 70–99)
Potassium: 4.3 mmol/L (ref 3.5–5.1)
Sodium: 139 mmol/L (ref 135–145)
Total Bilirubin: 0.7 mg/dL (ref 0.3–1.2)
Total Protein: 7.4 g/dL (ref 6.5–8.1)

## 2021-11-04 LAB — CBC
HCT: 42.7 % (ref 39.0–52.0)
Hemoglobin: 14.1 g/dL (ref 13.0–17.0)
MCH: 31.8 pg (ref 26.0–34.0)
MCHC: 33 g/dL (ref 30.0–36.0)
MCV: 96.2 fL (ref 80.0–100.0)
Platelets: 451 10*3/uL — ABNORMAL HIGH (ref 150–400)
RBC: 4.44 MIL/uL (ref 4.22–5.81)
RDW: 14.9 % (ref 11.5–15.5)
WBC: 13.2 10*3/uL — ABNORMAL HIGH (ref 4.0–10.5)
nRBC: 0 % (ref 0.0–0.2)

## 2021-11-04 LAB — RESP PANEL BY RT-PCR (FLU A&B, COVID) ARPGX2
Influenza A by PCR: NEGATIVE
Influenza B by PCR: NEGATIVE
SARS Coronavirus 2 by RT PCR: NEGATIVE

## 2021-11-04 LAB — PROTIME-INR
INR: 1 (ref 0.8–1.2)
Prothrombin Time: 12.8 seconds (ref 11.4–15.2)

## 2021-11-04 LAB — MAGNESIUM: Magnesium: 2.4 mg/dL (ref 1.7–2.4)

## 2021-11-04 LAB — GLUCOSE, CAPILLARY: Glucose-Capillary: 109 mg/dL — ABNORMAL HIGH (ref 70–99)

## 2021-11-04 MED ORDER — METHOCARBAMOL 500 MG PO TABS: 500.0000 mg | ORAL_TABLET | Freq: Three times a day (TID) | ORAL | Status: DC | PRN

## 2021-11-04 MED ORDER — PRAVASTATIN SODIUM 20 MG PO TABS
40.0000 mg | ORAL_TABLET | Freq: Every day | ORAL | Status: DC
  Administered 2021-11-05: 09:00:00 40 mg via ORAL
  Filled 2021-11-04: qty 2

## 2021-11-04 MED ORDER — IOHEXOL 9 MG/ML PO SOLN
500.0000 mL | ORAL | Status: AC
  Administered 2021-11-04 (×2): 500 mL via ORAL

## 2021-11-04 MED ORDER — FINASTERIDE 5 MG PO TABS
5.0000 mg | ORAL_TABLET | Freq: Every day | ORAL | Status: DC
  Administered 2021-11-05: 09:00:00 5 mg via ORAL
  Filled 2021-11-04: qty 1

## 2021-11-04 MED ORDER — IPRATROPIUM-ALBUTEROL 0.5-2.5 (3) MG/3ML IN SOLN: 3.0000 mL | Freq: Four times a day (QID) | RESPIRATORY_TRACT | Status: DC | PRN

## 2021-11-04 MED ORDER — ACETAMINOPHEN 325 MG PO TABS: 650.0000 mg | ORAL_TABLET | Freq: Four times a day (QID) | ORAL | Status: DC | PRN

## 2021-11-04 MED ORDER — TAMSULOSIN HCL 0.4 MG PO CAPS
0.4000 mg | ORAL_CAPSULE | Freq: Two times a day (BID) | ORAL | Status: DC
  Administered 2021-11-04 – 2021-11-05 (×2): 0.4 mg via ORAL
  Filled 2021-11-04 (×2): qty 1

## 2021-11-04 MED ORDER — TOPIRAMATE 25 MG PO TABS
25.0000 mg | ORAL_TABLET | Freq: Every day | ORAL | Status: DC
  Administered 2021-11-04: 25 mg via ORAL
  Filled 2021-11-04: qty 1

## 2021-11-04 MED ORDER — IPRATROPIUM-ALBUTEROL 20-100 MCG/ACT IN AERS: 1.0000 | INHALATION_SPRAY | Freq: Four times a day (QID) | RESPIRATORY_TRACT | Status: DC | PRN

## 2021-11-04 MED ORDER — OXYCODONE HCL 5 MG PO TABS
5.0000 mg | ORAL_TABLET | ORAL | Status: DC | PRN
  Administered 2021-11-04 – 2021-11-05 (×4): 10 mg via ORAL
  Filled 2021-11-04 (×4): qty 2

## 2021-11-04 MED ORDER — INSULIN ASPART 100 UNIT/ML IJ SOLN: 0.0000 [IU] | Freq: Three times a day (TID) | INTRAMUSCULAR | Status: DC

## 2021-11-04 MED ORDER — DICYCLOMINE HCL 20 MG PO TABS
20.0000 mg | ORAL_TABLET | ORAL | Status: DC | PRN
  Filled 2021-11-04: qty 1

## 2021-11-04 MED ORDER — TRAMADOL HCL 50 MG PO TABS: 50.0000 mg | ORAL_TABLET | Freq: Three times a day (TID) | ORAL | Status: DC | PRN

## 2021-11-04 MED ORDER — GABAPENTIN 300 MG PO CAPS
600.0000 mg | ORAL_CAPSULE | Freq: Three times a day (TID) | ORAL | Status: DC
  Administered 2021-11-04 – 2021-11-05 (×3): 600 mg via ORAL
  Filled 2021-11-04 (×3): qty 2

## 2021-11-04 MED ORDER — OXYCODONE HCL 5 MG PO TABS: 5.0000 mg | ORAL_TABLET | ORAL | Status: DC | PRN

## 2021-11-04 MED ORDER — SODIUM CHLORIDE 0.9 % IV SOLN: INTRAVENOUS | Status: DC

## 2021-11-04 MED ORDER — PANTOPRAZOLE SODIUM 40 MG PO TBEC
40.0000 mg | DELAYED_RELEASE_TABLET | Freq: Every day | ORAL | Status: DC
  Administered 2021-11-04: 40 mg via ORAL
  Filled 2021-11-04: qty 1

## 2021-11-04 MED ORDER — IOHEXOL 300 MG/ML  SOLN
100.0000 mL | Freq: Once | INTRAMUSCULAR | Status: AC | PRN
  Administered 2021-11-04: 100 mL via INTRAVENOUS

## 2021-11-04 MED ORDER — ZOLPIDEM TARTRATE 5 MG PO TABS
10.0000 mg | ORAL_TABLET | Freq: Every day | ORAL | Status: DC
  Administered 2021-11-04: 22:00:00 10 mg via ORAL
  Filled 2021-11-04: qty 2

## 2021-11-04 MED ORDER — FLUTICASONE PROPIONATE 50 MCG/ACT NA SUSP
2.0000 | Freq: Every day | NASAL | Status: DC | PRN
  Filled 2021-11-04: qty 16

## 2021-11-04 MED ORDER — CYCLOBENZAPRINE HCL 10 MG PO TABS: 10.0000 mg | ORAL_TABLET | Freq: Three times a day (TID) | ORAL | Status: DC | PRN

## 2021-11-04 MED ORDER — ONDANSETRON HCL 4 MG/2ML IJ SOLN
4.0000 mg | Freq: Four times a day (QID) | INTRAMUSCULAR | Status: DC | PRN
  Administered 2021-11-04 (×2): 4 mg via INTRAVENOUS
  Filled 2021-11-04 (×2): qty 2

## 2021-11-04 MED ORDER — MORPHINE SULFATE (PF) 2 MG/ML IV SOLN
2.0000 mg | INTRAVENOUS | Status: DC | PRN
  Administered 2021-11-04 – 2021-11-05 (×3): 2 mg via INTRAVENOUS
  Filled 2021-11-04 (×3): qty 1

## 2021-11-04 MED ORDER — ONDANSETRON HCL 4 MG PO TABS: 4.0000 mg | ORAL_TABLET | Freq: Four times a day (QID) | ORAL | Status: DC | PRN

## 2021-11-04 MED ORDER — OXYMETAZOLINE HCL 0.05 % NA SOLN
2.0000 | Freq: Two times a day (BID) | NASAL | Status: DC | PRN
  Filled 2021-11-04: qty 15

## 2021-11-04 NOTE — Telephone Encounter (Signed)
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 States last night he began having severe constipation and pain. States he did take a fleet enema and experienced bright red blood that soaked through pajamas. He states the bleeding continued throughout this morning on his way to work and had blood in his underwear from the time he left his house till he got to work at [employer]. He have stopped Xarelto this morning. Would like to know if he can be seen or what should he do. He is a doctor at [employer]

## 2021-11-04 NOTE — Telephone Encounter (Signed)
See note below and advise. 

## 2021-11-04 NOTE — Telephone Encounter (Signed)
This appears to be a patient of Dr. Henrene Pastor I am including him

## 2021-11-04 NOTE — H&P (Addendum)
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  He used an enema to relieve his constipation and immediately thereafter began to experience severe rectal pain, lower left quadrant abdominal pain, and gross bright red blood per rectum.  He reports at least 7 episodes subsequently of mucousy bloody stool since last evening/extending into today.  He presented to work, but when his symptoms persisted he called his GI doctor and was fit into the schedule.  After evaluation in the GI office it was felt most prudent to arrange for his admission for inpatient evaluation for lower GI bleeding and the possibility of ischemic colitis.  Of note he has wisely not taken his Xarelto since the onset of his symptoms.  Assessment/Plan  Acute lower GIB - BRBPR Acute segmental ischemic colitis versus unusual diverticular bleeding versus ulceration versus other - check CBC - gently hydrate - CT abdomen/pelvis - GI team to follow-up in a.m. - clear liquids only for tonight  Abdom pain - suspected segmental ischemic colitis See discussion above -contrasted CT abdomen/pelvis pending  PE/DVT August 2022 Not safe to anticoagulate presently - place SCDs - check hemoglobin -resume anticoagulation as soon as safe to do so   DM2 Monitor CBG with SSI for now   HLD Continue usual home medication  DVT prophylaxis: SCDs Code Status: FULL Family Communication: No family present at time of admission Disposition Plan:  Admit to Inpatient   Consults called: none indicated  Review of Systems: As per HPI otherwise 10 point review of systems negative.   Past Medical History:  Diagnosis Date   Anxiety    Aortic atherosclerosis (HCC)    BPH (benign prostatic hyperplasia)    Chronic pain syndrome    neck and low back   DDD (degenerative disc disease), cervical    Diverticulosis    DJD (degenerative joint disease)    Ganglion cyst    12 mm , right posterior knee   Hyperlipidemia    Internal hemorrhoids    Migraines    OA (osteoarthritis)    knees, hands, right shoulder   Positive QuantiFERON-TB Gold test    Shoulder pain    Tear of right rotator cuff    Type 2 diabetes mellitus ([place])    last A1c 5.4 on 04-13-2018 in epic (followed by pcp)   Wears glasses     Past Surgical History:  Procedure Laterality Date   ANAL FISSURE REPAIR     ARTHOSCOPIC ROTAOR CUFF REPAIR Right 06/15/2018   Procedure: RIGHT SHOULDER ARTHROSCOPY, DEBRIDEMENT, SUBACROMIAL DECOMPRESSION, DISTAL CLAVICLE RESECTION, ROTATOR CUFF REPAIR;  Surgeon: [name], MD;  Location: [facility];  Service: Orthopedics;  Laterality: Right;   COLONOSCOPY  05-26-2017   dr Henrene Pastor   Hip Resurface  2006   KNEE ARTHROSCOPY W/ MENISCAL REPAIR Bilateral right 1993; left 2010   MASS EXCISION Right 04/21/2018   Procedure: EXCISION RIGHT THIGH SOFT TISSUE MASS;  Surgeon: [name], MD;  [facility];  Service: Orthopedics;  Laterality: Right;   SEPTOPLASTY  SHOULDER ARTHROSCOPY WITH ROTATOR CUFF REPAIR Left    SLAP Tear Repair  2008   TONSILLECTOMY     TOTAL KNEE ARTHROPLASTY Left 12/08/2016   Procedure: LEFT TOTAL KNEE ARTHROPLASTY;  Surgeon: [name], MD;  [facility];  Service: Orthopedics;  Laterality: Left;   TOTAL KNEE ARTHROPLASTY WITH REVISION COMPONENTS Left 04/21/2018   Procedure: Left knee polyethylene exchange ;  Surgeon: [name], MD;  [facility];  Service: Orthopedics;  Laterality: Left;    Family  History  Family History  Problem Relation Age of Onset   Heart attack Father 32    Social History   reports that he has never smoked. He has never used smokeless tobacco. He reports current alcohol use. He reports that he does not use drugs. Married w/ children. Lives in [place], [place]. 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   Allergies Allergies  Allergen Reactions   Benzoin Compound Other (See Comments)    Blisters     Prior to Admission medications   Medication Sig Start Date End Date Taking? Authorizing Provider  COVID-19 mRNA bivalent vaccine, Pfizer, (PFIZER COVID-19 VAC BIVALENT) injection Inject into the muscle. 08/29/21   Carlyle Basques, MD  COVID-19 mRNA Vac-TriS, Pfizer, SUSP injection Inject into the muscle. 01/31/21   Carlyle Basques, MD  cyclobenzaprine (FLEXERIL) 10 MG tablet Take 1 tablet (10 mg total) by mouth 3 (three) times daily as needed. 09/30/21     dicyclomine (BENTYL) 20 MG tablet TAKE 1 TABLET BY MOUTH EVERY 4 TO 6 HOURS AS NEEDED FOR CRAMPING. 09/30/21 09/30/22  [name], MD  finasteride (PROSCAR) 5 MG tablet Take 1 tablet (5 mg total) by mouth daily. 08/29/21     fluticasone (FLONASE) 50 MCG/ACT nasal spray Place 2 sprays into both nostrils daily as needed for allergies.     [provider]  gabapentin (NEURONTIN) 600 MG tablet Take 1 tablet (600 mg total) by mouth 3 (three) times daily. 10/01/21     Ipratropium-Albuterol (COMBIVENT RESPIMAT) 20-100 MCG/ACT AERS respimat Inhale 1 puff into the lungs every 6 (six) hours as needed. 08/05/21     ketoconazole (NIZORAL) 2 % cream APPLY TOPICALLY TO AFFECTED AREA TWICE DAILY. 09/30/21     lidocaine (LIDODERM) 5 % APPLY 2 PATCHES TOPICALLY TO THE AFFECTED AREAS DAILY (12 HOURS ON AND 12 HOURS OFF). 09/30/21     metFORMIN (GLUCOPHAGE) 1000 MG tablet Take 1 tablet (1,000 mg total) by mouth 2 (two) times daily. 07/01/21     methocarbamol (ROBAXIN) 500 MG tablet Take 1 tablet (500 mg total) by mouth  up to 3 (three) times daily as needed for tearful muscle spasms and pain. 09/30/21     molnupiravir EUA (LAGEVRIO) 200 MG CAPS capsule Take 4 capsules (800 mg total) by mouth every 12 (twelve) hours for 5 days 07/11/21     ondansetron (ZOFRAN) 4 MG tablet Take 1 tablet (4 mg total) by mouth 3 (three) times daily. 07/11/21     OVER THE COUNTER MEDICATION Take 1 capsule by mouth 2 (two) times daily. Super Beta Prostate Supplement    [provider]  oxyCODONE (OXY IR/ROXICODONE) 5 MG immediate release tablet Take 1-2 tablets (5-10 mg total) by mouth every 8 (eight) hours as needed for pain. Caution: sedation 09/30/21     oxymetazoline (AFRIN) 0.05 % nasal spray Place 2 sprays into both nostrils 2 (two) times daily as needed for congestion.     [provider]  pantoprazole (PROTONIX) 40 MG tablet  Take 1 tablet (40 mg total) by mouth daily. 10/01/21     polyethylene glycol (MIRALAX / GLYCOLAX) 17 g packet Take 17 g by mouth daily. As needed    [provider]  pravastatin (PRAVACHOL) 40 MG tablet Take 1 tablet (40 mg total) by mouth daily. 07/04/21     Psyllium (METAMUCIL PO) Take by mouth. 1 tablet every day    [provider]  Respiratory Therapy Supplies (SPIROMETER) KIT Use 3 times daily 07/11/21     rivaroxaban (XARELTO) 20 MG TABS tablet Take 1 tablet (20 mg total) by mouth daily with food. 05/27/21     rizatriptan (MAXALT-MLT) 5 MG disintegrating tablet DISSOLVE ONE TABLET BY MOUTH AT ONSET OF HEADACHE MAY REPEAT IN 2 HOURS (MAX 6 TABLETS PER DAY) 09/30/21     tadalafil (CIALIS) 5 MG tablet Take 1 tablet (5 mg total) by mouth daily for benign prostatic hypertrophy. 07/01/21     tamsulosin (FLOMAX) 0.4 MG CAPS capsule Take 1 capsule (0.4 mg total) by mouth 2 (two) times daily. 09/30/21     topiramate (TOPAMAX) 25 MG tablet Take 1 tablet (25 mg total) by mouth at bedtime.( to prevent headaches) 08/05/21     traMADol (ULTRAM) 50 MG tablet Take 1-2 tablets (50-100 mg  total) by mouth every 6 (six) hours as needed for pain. Do not exceed 8 tablets per day. 04/08/21     zolpidem (AMBIEN) 10 MG tablet Take 1 tablet (10 mg total) by mouth at bedtime as needed for sleep. 04/08/21       Physical Exam: BP 140/88 HR 106 Sat 96% RA  Constitutional: NAD, calm, comfortable Eyes: PERRL, lids and conjunctivae normal ENMT: Mucous membranes are moist. Posterior pharynx clear of any exudate or lesions. Normal dentition.  Neck: normal, supple, no masses, no thyromegaly Respiratory: clear to auscultation bilaterally, no wheezing, no crackles. Normal respiratory effort. No accessory muscle use.  Cardiovascular: Regular rate and rhythm, no murmurs / rubs / gallops. No extremity edema. 2+ pedal pulses.  Abdomen: No tenderness or masses to palpation. No hepatosplenomegaly. Bowel sounds positive. Not distended. Soft.  Musculoskeletal: No clubbing / cyanosis. No joint deformity upper and lower extremities. No contractures. Normal muscle tone.  Skin: No rashes, lesions, ulcers.  Neurologic: CN 2-12 grossly intact B. Sensation intact. Strength 5/5 in all 4 extremities.  Psychiatric: Normal judgment and insight. Alert and oriented x 3. Normal mood.    Labs on Admission:  Patient being directly admitted from [facility] office, therefore no labs are available at the time of the admission.    Cherene Altes, MD [facility] Office  [phone] Pager - Text Page per Amion as per below:  On-Call/Text Page:      [email or web address]  If 7PM-7AM, please contact night-coverage [email or web address] 11/04/2021, 4:28 PM

## 2021-11-04 NOTE — Telephone Encounter (Signed)
First available is Wednesday with an app.

## 2021-11-04 NOTE — Telephone Encounter (Signed)
Pt scheduled to see Dr. Vicente Serene today at 3:30pm by Nicoletta Ba PA. Pt aware of appt.

## 2021-11-04 NOTE — Progress Notes (Signed)
Subjective:    Patient ID: Daniel Bossier, MD, male    DOB: [birth year], 61 y.o.   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 Henrene Pastor from prior colonoscopy done in August 2018.  He comes in today as an urgent add-on after calling this morning with abdominal pain, rectal pain and rectal bleeding. He had pandiverticulosis at the time of colonoscopy and moderate-sized internal hemorrhoids, no polyps. Patient has history of migraines, osteoarthritis, cervical spondylosis, and was diagnosed with a left lower extremity DVT and bilateral PEs about 4 months ago.  He has been on Xarelto since. He has been working over the past 6 days, he says last evening after work he had rectal pressure and urge for a bowel movement and had been constipated yesterday, was unable to get any stool to pass.  He tried to manually remove some stool, and then used a fleets enema.  After that he developed rectal pain and abdominal pain, passed stool and quite a bit of red blood.  He went to bed, did use a suppository as he thought perhaps the bleeding was hemorrhoidal, but was also having pain in his left lower abdomen.  He says when he woke up this morning he had noticed there was blood in his bed.  He had gone on to work and says that the rectal pain had not been as bad as last evening but has been hurting all day in both his rectum and his lower abdomen, worse with walking.  He has been a little lightheaded and diaphoretic at times.  No fever, no nausea or vomiting.  He has continued to ooze some blood off and on and has had 5-7 episodes throughout the day of grossly bloody mucoid stools.  He also mentions that his been difficult to urinate due to the pain.  He is hemodynamically stable here with blood pressure 140/88 pulse 106, afebrile.  He is most comfortable standing bent over the counter.  He has not had any previous similar episodes.  Does have some ongoing issues with intermittent  constipation and says he has used enemas in the past.  He is on numerous meds including Neurontin, cyclobenzaprine and oxycodone as needed for back pain.  Review of Systems Pertinent positive and negative review of systems were noted in the above HPI section.  All other review of systems was otherwise negative.   Outpatient Encounter Medications as of 11/04/2021  Medication Sig   cyclobenzaprine (FLEXERIL) 10 MG tablet Take 1 tablet (10 mg total) by mouth 3 (three) times daily as needed.   dicyclomine (BENTYL) 20 MG tablet TAKE 1 TABLET BY MOUTH EVERY 4 TO 6 HOURS AS NEEDED FOR CRAMPING.   gabapentin (NEURONTIN) 600 MG tablet Take 1 tablet (600 mg total) by mouth 3 (three) times daily.   Ipratropium-Albuterol (COMBIVENT RESPIMAT) 20-100 MCG/ACT AERS respimat Inhale 1 puff into the lungs every 6 (six) hours as needed.   ketoconazole (NIZORAL) 2 % cream APPLY TOPICALLY TO AFFECTED AREA TWICE DAILY.   lidocaine (LIDODERM) 5 % APPLY 2 PATCHES TOPICALLY TO THE AFFECTED AREAS DAILY (12 HOURS ON AND 12 HOURS OFF).   metFORMIN (GLUCOPHAGE) 1000 MG tablet Take 1 tablet (1,000 mg total) by mouth 2 (two) times daily.   methocarbamol (ROBAXIN) 500 MG tablet Take 1 tablet (500 mg total) by mouth up to 3 (three) times daily as needed for tearful muscle spasms and pain.   molnupiravir EUA (LAGEVRIO) 200 MG CAPS capsule Take  4 capsules (800 mg total) by mouth every 12 (twelve) hours for 5 days   ondansetron (ZOFRAN) 4 MG tablet Take 1 tablet (4 mg total) by mouth 3 (three) times daily.   OVER THE COUNTER MEDICATION Take 1 capsule by mouth 2 (two) times daily. Super Beta Prostate Supplement   oxyCODONE (OXY IR/ROXICODONE) 5 MG immediate release tablet Take 1-2 tablets (5-10 mg total) by mouth every 8 (eight) hours as needed for pain. Caution: sedation   oxymetazoline (AFRIN) 0.05 % nasal spray Place 2 sprays into both nostrils 2 (two) times daily as needed for congestion.    pantoprazole (PROTONIX) 40 MG tablet  Take 1 tablet (40 mg total) by mouth daily.   polyethylene glycol (MIRALAX / GLYCOLAX) 17 g packet Take 17 g by mouth daily. As needed   pravastatin (PRAVACHOL) 40 MG tablet Take 1 tablet (40 mg total) by mouth daily.   Psyllium (METAMUCIL PO) Take by mouth. 1 tablet every day   Respiratory Therapy Supplies (SPIROMETER) KIT Use 3 times daily   rivaroxaban (XARELTO) 20 MG TABS tablet Take 1 tablet (20 mg total) by mouth daily with food.   rizatriptan (MAXALT-MLT) 5 MG disintegrating tablet DISSOLVE ONE TABLET BY MOUTH AT ONSET OF HEADACHE MAY REPEAT IN 2 HOURS (MAX 6 TABLETS PER DAY)   tadalafil (CIALIS) 5 MG tablet Take 1 tablet (5 mg total) by mouth daily for benign prostatic hypertrophy.   tamsulosin (FLOMAX) 0.4 MG CAPS capsule Take 1 capsule (0.4 mg total) by mouth 2 (two) times daily.   topiramate (TOPAMAX) 25 MG tablet Take 1 tablet (25 mg total) by mouth at bedtime.( to prevent headaches)   traMADol (ULTRAM) 50 MG tablet Take 1-2 tablets (50-100 mg total) by mouth every 6 (six) hours as needed for pain. Do not exceed 8 tablets per day.   zolpidem (AMBIEN) 10 MG tablet Take 1 tablet (10 mg total) by mouth at bedtime as needed for sleep.   [DISCONTINUED] pantoprazole (PROTONIX) 40 MG tablet Take 1 tablet (40 mg total) by mouth daily.   COVID-19 mRNA bivalent vaccine, Pfizer, (PFIZER COVID-19 VAC BIVALENT) injection Inject into the muscle.   COVID-19 mRNA Vac-TriS, Pfizer, SUSP injection Inject into the muscle.   finasteride (PROSCAR) 5 MG tablet Take 1 tablet (5 mg total) by mouth daily.   fluticasone (FLONASE) 50 MCG/ACT nasal spray Place 2 sprays into both nostrils daily as needed for allergies.    [DISCONTINUED] amoxicillin-clavulanate (AUGMENTIN) 875-125 MG tablet Take 1 tablet by mouth 2 (two) times daily for 5 days. (Patient not taking: Reported on 11/04/2021)   [DISCONTINUED] baclofen (LIORESAL) 10 MG tablet Take 1-2 tablets (10-20 mg total) by mouth 2 (two) times daily as needed.  (Patient not taking: Reported on 11/04/2021)   [DISCONTINUED] butalbital-acetaminophen-caffeine (FIORICET) 50-325-40 MG tablet Take 1-2 tablets by mouth every 4 (four) hours as needed. (Max 6 in 24 hours) (Patient not taking: Reported on 11/04/2021)   [DISCONTINUED] butalbital-acetaminophen-caffeine (FIORICET) 50-325-40 MG tablet Take 1-2 tablet by mouth every 4 (four) to 6 (six) hours as needed. (max 6 tablets in 24 hours) (Patient not taking: Reported on 11/04/2021)   [DISCONTINUED] COVID-19 At Home Antigen Test (CARESTART COVID-19 HOME TEST) KIT Use as directed (Patient not taking: Reported on 11/04/2021)   [DISCONTINUED] diazepam (VALIUM) 5 MG tablet Take 1 tablet (5 mg total) by mouth 2 (two) times daily. (Patient not taking: Reported on 11/04/2021)   [DISCONTINUED] gabapentin (NEURONTIN) 100 MG capsule Take 2 capsules (200 mg total) by mouth 3 (three)  times daily. (Patient not taking: Reported on 11/04/2021)   [DISCONTINUED] guaiFENesin-codeine 100-10 MG/5ML syrup Take 10 mLs by mouth every 4 (four) hours as needed (Patient not taking: Reported on 11/04/2021)   [DISCONTINUED] influenza vac split quadrivalent PF (FLUARIX) 0.5 ML injection Inject 0.5 mLs into the muscle.   [DISCONTINUED] Ipratropium-Albuterol (COMBIVENT RESPIMAT) 20-100 MCG/ACT AERS respimat Inhale 2 puffs into the lungs every 6 (six) hours as needed. (Patient not taking: Reported on 06/03/2021)   [DISCONTINUED] oxycodone (OXY-IR) 5 MG capsule Take 5 mg by mouth 3 (three) times daily. (Patient not taking: Reported on 11/04/2021)   [DISCONTINUED] predniSONE (DELTASONE) 50 MG tablet Take 1 tablet (50 mg total) by mouth daily. (Patient not taking: Reported on 11/04/2021)   [DISCONTINUED] pseudoephedrine (SUDAFED) 120 MG 12 hr tablet Take 120 mg by mouth daily as needed for congestion. (Patient not taking: Reported on 06/03/2021)   [DISCONTINUED] sulfamethoxazole-trimethoprim (BACTRIM DS) 800-160 MG tablet Take 1 tablet by mouth 2 (two) times daily  begin morning of prostate biopsy (Patient not taking: Reported on 11/04/2021)   [DISCONTINUED] tadalafil (CIALIS) 5 MG tablet Take 1 tablet (5 mg total) by mouth daily for benign prostatic hypertrophy (Patient not taking: Reported on 11/04/2021)   [DISCONTINUED] zolpidem (AMBIEN) 10 MG tablet Take 1 tablet (10 mg total) by mouth at bedtime as needed for sleep (Patient not taking: Reported on 11/04/2021)   No facility-administered encounter medications on file as of 11/04/2021.   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   OA (osteoarthritis) of knee 12/08/2016   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 without status migrainosus 12/17/2015   Cervical facet joint syndrome 12/17/2015   Chronic bilateral low back pain without sciatica 12/17/2015   Primary osteoarthritis of left knee 12/17/2015   Social History   Socioeconomic History   Marital status: Married    Spouse name: Not on file   Number of children: Not on file   Years of education: Not on file   Highest education level: Not on file  Occupational History   Not on file  Tobacco Use   Smoking status: Never   Smokeless tobacco: Never  Vaping Use   Vaping Use: Never used  Substance and Sexual Activity   Alcohol use: Yes    Comment: seldom   Drug use: Never   Sexual activity: Yes  Other Topics Concern   Not on file  Social History Narrative   Not on file   Social Determinants of Health   Financial Resource Strain: Not on file  Food Insecurity: Not on file  Transportation Needs: Not on file  Physical Activity: Not on file  Stress: Not on file  Social Connections: Not on file   Intimate Partner Violence: Not on file    Mr. Chapel's family history includes Heart attack (age of onset: 52) in his father.      Objective:    Vitals:   11/04/21 1543  BP: 140/88  Pulse: (!) 106  SpO2: 96%    Physical Exam Well-developed well-nourished uncomfortable appearing AA male  in no acute distress.   Weight, 319 BMI 42.1  HEENT; nontraumatic normocephalic, EOMI, PE R LA, sclera anicteric.  Oropharynx; not examined Neck; supple, no JVD Cardiovascular; regular rate and rhythm with S1-S2, no murmur rub or gallop Pulmonary; Clear bilaterally Abdomen; soft, obese he is quite tender in the left mid quadrant left lower quadrant and suprapubic area no definite rebound, nondistended, no palpable mass or hepatosplenomegaly, bowel sounds are active Rectal; grade 2 hemorrhoids internal, no significant tenderness on digital exam, no palpable fissure, mucus and dark red heme on glove Skin; benign exam, no jaundice rash or appreciable lesions Extremities; no clubbing cyanosis or edema skin warm and dry Neuro/Psych; alert and oriented x4, grossly nonfocal mood and affect appropriate        Assessment & Plan:   #2 61 year old male with acute onset illness last evening with constipation and inability to pass stool, followed by rectal pressure, then enema usage followed by rectal pain and lower abdominal pain and rectal bleeding. He has had 5-7 episodes of bloody mucoid stool today, persistent lower abdominal pain, rectal pain, difficulty urinating secondary to pain. He is hemodynamically stable but tachycardic Quite tender in the left mid and left lower quadrant and suprapubic region on exam  Etiology of symptoms is not entirely clear, however have suspicion for an acute segmental ischemic colitis, consider bowel injury secondary to enema doubt perforation but cannot rule out, rule out diverticulitis though unusual to have bleeding and pain.  #2 internal hemorrhoids #3 chronic  anticoagulation-on Xarelto #4 recent left lower extremity DVT and bilateral PEs August 2022 #5 chronic back pain history of cervical spondylosis #6 osteoarthritis #7 BPH  Plan; patient will be admitted to [facility], I have spoken with Dr. Thereasa Solo who will admit. Needs work-up with labs, CT abdomen and pelvis with contrast, hydration and pain management. Hold Xarelto Further recommendations and plans pending results of labs and CT. GI will follow  Yarelly Kuba Genia Harold PA-C 11/04/2021   Cc: Donnajean Lopes, MD

## 2021-11-04 NOTE — Progress Notes (Signed)
Omnipaque  oral solution completed. Call out to radiology for status for procedure scan.

## 2021-11-04 NOTE — Telephone Encounter (Signed)
Chart reviewed. Work him in to see one of the advanced practitioners today.

## 2021-11-05 ENCOUNTER — Encounter (HOSPITAL_COMMUNITY)
Admission: AD | Disposition: A | Discharge status: Home / Self Care | Payer: Self-pay | Source: Ambulatory Visit | Attending: Internal Medicine

## 2021-11-05 ENCOUNTER — Inpatient Hospital Stay (HOSPITAL_COMMUNITY): Payer: 59 | Admitting: Certified Registered"

## 2021-11-05 ENCOUNTER — Encounter (HOSPITAL_COMMUNITY): Payer: Self-pay | Admitting: Internal Medicine

## 2021-11-05 ENCOUNTER — Other Ambulatory Visit (HOSPITAL_COMMUNITY): Payer: Self-pay

## 2021-11-05 DIAGNOSIS — K625 Hemorrhage of anus and rectum: Secondary | ICD-10-CM

## 2021-11-05 HISTORY — PX: FLEXIBLE SIGMOIDOSCOPY: SHX5431

## 2021-11-05 LAB — CBC
HCT: 39.8 % (ref 39.0–52.0)
Hemoglobin: 12.9 g/dL — ABNORMAL LOW (ref 13.0–17.0)
MCH: 31.3 pg (ref 26.0–34.0)
MCHC: 32.4 g/dL (ref 30.0–36.0)
MCV: 96.6 fL (ref 80.0–100.0)
Platelets: 394 10*3/uL (ref 150–400)
RBC: 4.12 MIL/uL — ABNORMAL LOW (ref 4.22–5.81)
RDW: 15 % (ref 11.5–15.5)
WBC: 11.4 10*3/uL — ABNORMAL HIGH (ref 4.0–10.5)
nRBC: 0 % (ref 0.0–0.2)

## 2021-11-05 LAB — GLUCOSE, CAPILLARY
Glucose-Capillary: 118 mg/dL — ABNORMAL HIGH (ref 70–99)
Glucose-Capillary: 111 mg/dL — ABNORMAL HIGH (ref 70–99)
Glucose-Capillary: 96 mg/dL (ref 70–99)
Glucose-Capillary: 113 mg/dL — ABNORMAL HIGH (ref 70–99)

## 2021-11-05 LAB — HEMOGLOBIN A1C
Hgb A1c MFr Bld: 5.7 % — ABNORMAL HIGH (ref 4.8–5.6)
Mean Plasma Glucose: 117 mg/dL

## 2021-11-05 LAB — LACTATE DEHYDROGENASE: LDH: 138 U/L (ref 98–192)

## 2021-11-05 LAB — HIV ANTIBODY (ROUTINE TESTING W REFLEX): HIV Screen 4th Generation wRfx: NONREACTIVE

## 2021-11-05 SURGERY — SIGMOIDOSCOPY, FLEXIBLE
Anesthesia: Monitor Anesthesia Care

## 2021-11-05 MED ORDER — BACLOFEN 10 MG PO TABS: 10.0000 mg | ORAL_TABLET | ORAL | Status: DC

## 2021-11-05 MED ORDER — RIZATRIPTAN BENZOATE 5 MG PO TBDP: 5.0000 mg | ORAL_TABLET | Freq: Once | ORAL | Status: DC | PRN

## 2021-11-05 MED ORDER — PROPOFOL 1000 MG/100ML IV EMUL
INTRAVENOUS | Status: AC
  Filled 2021-11-05: qty 100

## 2021-11-05 MED ORDER — RIVAROXABAN 20 MG PO TABS: 20.0000 mg | ORAL_TABLET | Freq: Every day | ORAL | 3 refills | Status: DC

## 2021-11-05 MED ORDER — LIDOCAINE 2% (20 MG/ML) 5 ML SYRINGE
INTRAMUSCULAR | Status: DC | PRN
  Administered 2021-11-05: 60 mg via INTRAVENOUS

## 2021-11-05 MED ORDER — ZOLPIDEM TARTRATE 5 MG PO TABS: 10.0000 mg | ORAL_TABLET | Freq: Every day | ORAL | Status: DC

## 2021-11-05 MED ORDER — ONDANSETRON HCL 4 MG/2ML IJ SOLN
INTRAMUSCULAR | Status: AC
  Filled 2021-11-05: qty 2

## 2021-11-05 MED ORDER — RIZATRIPTAN BENZOATE 5 MG PO TBDP: 5.0000 mg | ORAL_TABLET | ORAL | Status: DC

## 2021-11-05 MED ORDER — POLYETHYLENE GLYCOL 3350 17 G PO PACK
17.0000 g | PACK | Freq: Every day | ORAL | 1 refills | Status: AC
  Filled 2021-11-05: qty 30, 30d supply, fill #0

## 2021-11-05 MED ORDER — ONDANSETRON HCL 4 MG/2ML IJ SOLN
4.0000 mg | Freq: Once | INTRAMUSCULAR | Status: AC
  Administered 2021-11-05: 15:00:00 4 mg via INTRAVENOUS

## 2021-11-05 MED ORDER — HYDROCORTISONE ACETATE 25 MG RE SUPP
25.0000 mg | Freq: Two times a day (BID) | RECTAL | 1 refills | Status: DC
  Filled 2021-11-05: qty 12, 6d supply, fill #0

## 2021-11-05 MED ORDER — ACETAMINOPHEN 325 MG PO TABS: 650.0000 mg | ORAL_TABLET | Freq: Four times a day (QID) | ORAL | Status: DC | PRN

## 2021-11-05 MED ORDER — BACLOFEN 10 MG PO TABS: 10.0000 mg | ORAL_TABLET | Freq: Every day | ORAL | Status: DC | PRN

## 2021-11-05 MED ORDER — HYDROCORTISONE ACETATE 25 MG RE SUPP
25.0000 mg | Freq: Two times a day (BID) | RECTAL | Status: DC
  Administered 2021-11-05: 11:00:00 25 mg via RECTAL
  Filled 2021-11-05 (×2): qty 1

## 2021-11-05 MED ORDER — LIDOCAINE 4 % EX CREA
TOPICAL_CREAM | Freq: Four times a day (QID) | CUTANEOUS | 1 refills | Status: DC | PRN
  Filled 2021-11-05: qty 30, 12d supply, fill #0
  Filled 2021-12-27: qty 30, 12d supply, fill #1

## 2021-11-05 MED ORDER — DIAZEPAM 5 MG PO TABS: 5.0000 mg | ORAL_TABLET | Freq: Every day | ORAL | Status: DC | PRN

## 2021-11-05 MED ORDER — LACTATED RINGERS IV SOLN: INTRAVENOUS | Status: DC

## 2021-11-05 MED ORDER — DIAZEPAM 5 MG PO TABS: 5.0000 mg | ORAL_TABLET | ORAL | Status: DC

## 2021-11-05 MED ORDER — TADALAFIL 5 MG PO TABS
5.0000 mg | ORAL_TABLET | Freq: Every day | ORAL | Status: DC
  Administered 2021-11-05: 11:00:00 5 mg via ORAL
  Filled 2021-11-05: qty 1

## 2021-11-05 MED ORDER — POLYETHYLENE GLYCOL 3350 17 G PO PACK
17.0000 g | PACK | Freq: Every day | ORAL | Status: DC
  Administered 2021-11-05: 11:00:00 17 g via ORAL
  Filled 2021-11-05: qty 1

## 2021-11-05 MED ORDER — PROPOFOL 10 MG/ML IV BOLUS
INTRAVENOUS | Status: DC | PRN
Start: 2021-11-05 — End: 2021-11-05
  Administered 2021-11-05 (×3): 30 mg via INTRAVENOUS

## 2021-11-05 MED ORDER — HYDROCORTISONE ACETATE 25 MG RE SUPP
25.0000 mg | Freq: Two times a day (BID) | RECTAL | Status: DC
  Filled 2021-11-05: qty 1

## 2021-11-05 MED ORDER — SODIUM CHLORIDE 0.9 % IV SOLN: INTRAVENOUS | Status: DC

## 2021-11-05 MED ORDER — BUTALBITAL-APAP-CAFFEINE 50-325-40 MG PO TABS
1.0000 | ORAL_TABLET | ORAL | Status: DC | PRN
  Administered 2021-11-05: 11:00:00 1 via ORAL
  Filled 2021-11-05: qty 1

## 2021-11-05 MED ORDER — SUMATRIPTAN SUCCINATE 50 MG PO TABS
50.0000 mg | ORAL_TABLET | ORAL | Status: DC | PRN
  Filled 2021-11-05: qty 1

## 2021-11-05 MED ORDER — LIDOCAINE 4 % EX CREA
TOPICAL_CREAM | Freq: Four times a day (QID) | CUTANEOUS | Status: DC | PRN
  Filled 2021-11-05 (×3): qty 5

## 2021-11-05 MED ORDER — BACLOFEN 10 MG PO TABS
10.0000 mg | ORAL_TABLET | Freq: Every day | ORAL | Status: DC
  Administered 2021-11-05: 09:00:00 10 mg via ORAL
  Filled 2021-11-05: qty 1

## 2021-11-05 MED ORDER — METAMUCIL 28.3 % PO POWD: ORAL | Status: AC

## 2021-11-05 MED ORDER — PROPOFOL 500 MG/50ML IV EMUL
INTRAVENOUS | Status: DC | PRN
  Administered 2021-11-05: 125 ug/kg/min via INTRAVENOUS

## 2021-11-05 NOTE — Progress Notes (Signed)
°  Transition of Care [facility]) Screening Note   Patient Details  Name: [name], MD Date of Birth: 1961-06-03   [facility]) CM/SW Contact:    Lennart Pall, LCSW Phone Number: 11/05/2021, 10:19 AM    Transition of Care Department [facility]) has reviewed patient and no TOC needs have been identified at this time. We will continue to monitor patient advancement through interdisciplinary progression rounds. If new patient transition needs arise, please place a TOC consult.

## 2021-11-05 NOTE — Discharge Summary (Signed)
DISCHARGE SUMMARY  [name], MD  MR#: [id number]  DOB:Nov 30, 1960  Date of Admission: 11/04/2021 Date of Discharge: 11/05/2021  Attending Physician:Joany Khatib Hennie Duos, MD  Patient's JQG:BEEFEOFH, Ermalene Searing, MD  Consults: [facility]  Disposition: D/C home   Follow-up Appts:  Follow-up Information     [name], MD Follow up in 2 week(s).   Specialty: Gastroenterology Contact information: 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  He used an enema to relieve his constipation and immediately thereafter began to experience severe rectal pain, lower left quadrant abdominal pain, and gross bright red blood per rectum.  He reported at least 7 episodes subsequently of mucousy bloody stool.  He presented to work, but when his symptoms persisted he called his GI doctor and was fit into the schedule.  After evaluation in the GI office it was felt most prudent to arrange for his admission for inpatient evaluation for lower GI bleeding.  Of note he has wisely not taken his Xarelto since the onset of his symptoms.  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  His hemoglobin was monitored and dropped ~1 g during his admission, which is not unexpected given that he was hydrated and also actively bleeding.  He remained hemodynamically stable.  CT abdomen and  pelvis was accomplished and fortunately did not reveal any acute findings and was not suggestive of ischemic colitis.  GI took the patient to the endoscopy suite and performed flex sig 1/24.  This revealed semi-thrombosed internal and external hemorrhoids with adherent clot as well as macerated tissue at the distal rectum versus an indurated hemorrhoid.  The patient tolerated the procedure well.  He remained hemodynamically stable throughout his hospital stay.  Following his flex sig he was deemed to be safe for discharge home.  He is to continue hydrocortisone suppositories twice daily as well as as needed lidocaine gel to the rectum.  He will continue a daily fiber supplement as well as MiraLAX.  He will be followed up by [facility] in approximately 2 weeks.  He is counseled that he can resume his Xarelto 11/06/2021, or in the timing that he feels most comfortable with.  Acute lower GIB - BRBPR CT abdomen/pelvis w/o acute findings - Hgb dropped modestly, but remained well within safe range - GI team followed as noted Above    Abdom pain See discussion above -no evidence of ischemic colitis on CT imaging -pain improved at time of discharge   PE/DVT August 2022 Not safe to anticoagulate during the hospital stay with SCDs being used instead -cleared for resumption of Xarelto as per discussion above   DM2 CBG controlled during this admission   HLD Continue usual home medication  Allergies as of 11/05/2021       Reactions   Benzoin    Other reaction(s): blisters   Benzoin Compound  Other (See Comments)   Blisters         Medication List     TAKE these medications    baclofen 10 MG tablet Commonly known as: LIORESAL Take 10-20 mg by mouth See admin instructions. Take 10-20 mg by mouth in the morning and an additional 10-20 mg once a day as needed for muscle spasms   Combivent Respimat 20-100 MCG/ACT Aers respimat Generic drug: Ipratropium-Albuterol Inhale 1 puff into the lungs every 6  (six) hours as needed. What changed: reasons to take this   cyclobenzaprine 10 MG tablet Commonly known as: FLEXERIL Take 1 tablet (10 mg total) by mouth 3 (three) times daily as needed. What changed:  how much to take when to take this additional instructions   diazepam 5 MG tablet Commonly known as: VALIUM Take 5 mg by mouth See admin instructions. Take 5 mg by mouth in the morning or at bedtime as needed for spasms   dicyclomine 20 MG tablet Commonly known as: BENTYL TAKE 1 TABLET BY MOUTH EVERY 4 TO 6 HOURS AS NEEDED FOR CRAMPING. What changed:  how much to take how to take this when to take this additional instructions   finasteride 5 MG tablet Commonly known as: PROSCAR Take 1 tablet (5 mg total) by mouth daily.   fluticasone 50 MCG/ACT nasal spray Commonly known as: FLONASE Place 2 sprays into both nostrils daily as needed for allergies.   gabapentin 600 MG tablet Commonly known as: NEURONTIN Take 1 tablet (600 mg total) by mouth 3 (three) times daily. What changed:  how much to take when to take this additional instructions   hydrocortisone 25 MG suppository Commonly known as: ANUSOL-HC Place 1 suppository (25 mg total) rectally 2 (two) times daily.   ketoconazole 2 % cream Commonly known as: NIZORAL APPLY TOPICALLY TO AFFECTED AREA TWICE DAILY. What changed:  when to take this reasons to take this   lidocaine 5 % Commonly known as: LIDODERM APPLY 2 PATCHES TOPICALLY TO THE AFFECTED AREAS DAILY (12 HOURS ON AND 12 HOURS OFF). What changed:  when to take this reasons to take this   lidocaine 4 % cream Commonly known as: LMX Apply topically 4 (four) times daily as needed (perianal pain). What changed: You were already taking a medication with the same name, and this prescription was added. Make sure you understand how and when to take each.   loratadine 10 MG tablet Commonly known as: CLARITIN Take 10 mg by mouth daily as needed for allergies or  rhinitis.   Metamucil 28.3 % Powd Generic drug: Psyllium Mix 3 teaspoonsful of powder into the appropriate amount of water and drink once a day. What changed:  medication strength how to take this when to take this additional instructions   metFORMIN 1000 MG tablet Commonly known as: GLUCOPHAGE Take 1 tablet (1,000 mg total) by mouth 2 (two) times daily.   methocarbamol 500 MG tablet Commonly known as: ROBAXIN Take 1 tablet (500 mg total) by mouth up to 3 (three) times daily as needed for tearful muscle spasms and pain. What changed:  when to take this additional instructions   ondansetron 4 MG tablet Commonly known as: ZOFRAN Take 1 tablet (4 mg total) by mouth 3 (three) times daily. What changed:  when to take this reasons to take this   OVER THE COUNTER MEDICATION Take 1 capsule by mouth See admin instructions. Super Beta Prostate Supplement capsules- Take 1 capsule by mouth two times a day  oxyCODONE 5 MG immediate release tablet Commonly known as: Oxy IR/ROXICODONE Take 1-2 tablets (5-10 mg total) by mouth every 8 (eight) hours as needed for pain. Caution: sedation   oxymetazoline 0.05 % nasal spray Commonly known as: AFRIN Place 2 sprays into both nostrils 2 (two) times daily as needed for congestion.   pantoprazole 40 MG tablet Commonly known as: PROTONIX Take 1 tablet (40 mg total) by mouth daily. What changed: when to take this   polyethylene glycol 17 g packet Commonly known as: MIRALAX / GLYCOLAX Take 17 g by mouth daily. What changed:  when to take this reasons to take this   pravastatin 40 MG tablet Commonly known as: PRAVACHOL Take 1 tablet (40 mg total) by mouth daily. What changed: when to take this   rivaroxaban 20 MG Tabs tablet Commonly known as: Xarelto Take 1 tablet (20 mg total) by mouth daily with food. Start taking on: November 06, 2021   rizatriptan 5 MG disintegrating tablet Commonly known as: MAXALT-MLT DISSOLVE ONE TABLET BY  MOUTH AT ONSET OF HEADACHE MAY REPEAT IN 2 HOURS (MAX 6 TABLETS PER DAY) What changed:  how much to take how to take this when to take this additional instructions   Spirometer Kit Use 3 times daily   tadalafil 5 MG tablet Commonly known as: CIALIS Take 1 tablet (5 mg total) by mouth daily for benign prostatic hypertrophy.   tamsulosin 0.4 MG Caps capsule Commonly known as: FLOMAX Take 1 capsule (0.4 mg total) by mouth 2 (two) times daily.   topiramate 25 MG tablet Commonly known as: TOPAMAX Take 1 tablet (25 mg total) by mouth at bedtime.( to prevent headaches)   traMADol 50 MG tablet Commonly known as: ULTRAM Take 1-2 tablets (50-100 mg total) by mouth every 6 (six) hours as needed for pain. Do not exceed 8 tablets per day. What changed:  when to take this additional instructions   zolpidem 10 MG tablet Commonly known as: AMBIEN Take 10 mg by mouth at bedtime. What changed: Another medication with the same name was removed. Continue taking this medication, and follow the directions you see here.        Day of Discharge BP (!) 172/86 (BP Location: Left Arm)    Pulse 84    Temp 97.7 F (36.5 C) (Oral)    Resp 18    Ht 6' (1.829 m)    Wt (!) 140.6 kg    SpO2 100%    BMI 42.04 kg/m   Physical Exam: General: No acute respiratory distress Lungs: Clear to auscultation bilaterally without wheezes or crackles Cardiovascular: Regular rate and rhythm without murmur gallop or rub normal S1 and S2 Abdomen: Nontender, nondistended, soft, bowel sounds positive, no rebound, no ascites, no appreciable mass Extremities: No significant cyanosis, clubbing, or edema bilateral lower extremities  Basic Metabolic Panel: Recent Labs  Lab 11/04/21 1731  NA 139  K 4.3  CL 108  CO2 24  GLUCOSE 112*  BUN 20  CREATININE 0.97  CALCIUM 8.7*  MG 2.4    Liver Function Tests: Recent Labs  Lab 11/04/21 1731  AST 21  ALT 30  ALKPHOS 72  BILITOT 0.7  PROT 7.4  ALBUMIN 3.9     Coags: Recent Labs  Lab 11/04/21 1731  INR 1.0    CBC: Recent Labs  Lab 11/04/21 1731 11/05/21 0425  WBC 13.2* 11.4*  HGB 14.1 12.9*  HCT 42.7 39.8  MCV 96.2 96.6  PLT 451* 394  CBG: Recent Labs  Lab 11/04/21 2151 11/05/21 0733 11/05/21 1138 11/05/21 1404 11/05/21 1627  GLUCAP 109* 118* 111* 96 113*    Time spent in discharge (includes decision making & examination of pt): 30 minutes  11/05/2021, 4:59 PM   Cherene Altes, MD [facility] Office  [phone]

## 2021-11-05 NOTE — H&P (View-Only) (Signed)
° ° ° °  [facility] Progress Note  CC:  Constipation and GI bleed  Subjective:  Still consistently dripping blood from his bottom.  No BM since he has been here.  Still some LLQ abdominal pain.  Objective:  Vital signs in last 24 hours: Temp:  [97.8 F (36.6 C)-98.8 F (37.1 C)] 98.8 F (37.1 C) (01/24 0458) Pulse Rate:  [90-106] 99 (01/24 0458) Resp:  [16-20] 16 (01/24 0458) BP: (139-170)/(78-99) 139/95 (01/24 0458) SpO2:  [96 %-99 %] 97 % (01/24 0458) Weight:  [144.9 kg] 144.9 kg (01/23 1543) Last BM Date: 11/04/21 General:  Alert, Well-developed, in NAD Heart:  Regular rate and rhythm; no murmurs Pulm:  CTAB.  No W/R/R. Abdomen:  Soft, non-distended.  BS present.  Minimal LLQ TTP. Extremities:  Without edema. Neurologic:  Alert and oriented x 4;  grossly normal neurologically. Psych:  Alert and cooperative. Normal mood and affect.  Lab Results: Recent Labs    11/04/21 1731 11/05/21 0425  WBC 13.2* 11.4*  HGB 14.1 12.9*  HCT 42.7 39.8  PLT 451* 394   BMET Recent Labs    11/04/21 1731  NA 139  K 4.3  CL 108  CO2 24  GLUCOSE 112*  BUN 20  CREATININE 0.97  CALCIUM 8.7*   LFT Recent Labs    11/04/21 1731  PROT 7.4  ALBUMIN 3.9  AST 21  ALT 30  ALKPHOS 72  BILITOT 0.7   PT/INR Recent Labs    11/04/21 1731  LABPROT 12.8  INR 1.0    Assessment / Plan: #70 61 year old male with acute onset illness with constipation and inability to pass stool, followed by rectal pressure, then manual disimpaction and enema usage followed by rectal pain and lower abdominal pain and rectal bleeding.  Typically only uses colace at home.  CT scan without cause of symptoms.  Suspect low-grade diverticular bleed in the setting of Xarelto or possibly just trauma from enema/digital trauma from manual disimpaction, maybe mild stercoral colitis or stercoral ulcer from constipation but no CT evidence to support that.  Hemoglobin down slightly from 14 g range to 12.9  g.  Likely somewhat dilutional.   #2 internal hemorrhoids #3 chronic anticoagulation-on Xarelto, but that is on hold. #4 recent left lower extremity DVT and bilateral PEs August 2022 #5 chronic back pain history of cervical spondylosis:  on pain medications at home. #6 osteoarthritis     LOS: 1 day   Laban Emperor. Zehr  11/05/2021, 9:10 AM  _______________________________________________________________________________________________________________________________________  Velora Heckler GI MD note:  I personally examined the patient, reviewed the data and agree with the assessment and plan described above. Clearly has some firm external hemorrhoids on exam and likely some firm internal hemorrhoids as well (by my DRE at bedside).  This seems most likely hemorrhoid disease related to acute on chronic constipation.  May have some trauma from enema since bleeding started immediately afterwards.  ? Stercoral ulcer. I doubt this is a diverticular bleed. I am planning flex sig this afternoon for further evaluation.   Owens Loffler, MD [facility] Pager [phone]

## 2021-11-05 NOTE — Anesthesia Preprocedure Evaluation (Signed)
Anesthesia Evaluation  Patient identified by MRN, date of birth, ID band Patient awake    Reviewed: Allergy & Precautions, NPO status , Patient's Chart, lab work & pertinent test results  Airway Mallampati: II  TM Distance: >3 FB Neck ROM: Full    Dental no notable dental hx.    Pulmonary neg pulmonary ROS,    Pulmonary exam normal breath sounds clear to auscultation       Cardiovascular negative cardio ROS Normal cardiovascular exam Rhythm:Regular Rate:Normal     Neuro/Psych  Headaches, Anxiety    GI/Hepatic negative GI ROS, Neg liver ROS,   Endo/Other  diabetes, Oral Hypoglycemic AgentsMorbid obesity  Renal/GU negative Renal ROS     Musculoskeletal  (+) Arthritis ,   Abdominal   Peds  Hematology   Anesthesia Other Findings Rectal bleeding  Reproductive/Obstetrics                             Anesthesia Physical Anesthesia Plan  ASA: 3  Anesthesia Plan: MAC   Post-op Pain Management:    Induction: Intravenous  PONV Risk Score and Plan: 1 and Propofol infusion and Treatment may vary due to age or medical condition  Airway Management Planned: Simple Face Mask  Additional Equipment:   Intra-op Plan:   Post-operative Plan:   Informed Consent: I have reviewed the patients History and Physical, chart, labs and discussed the procedure including the risks, benefits and alternatives for the proposed anesthesia with the patient or authorized representative who has indicated his/her understanding and acceptance.     Dental advisory given  Plan Discussed with: CRNA  Anesthesia Plan Comments:         Anesthesia Quick Evaluation

## 2021-11-05 NOTE — Progress Notes (Signed)
Assessment and plan reviewed after discussing case in detail with GI physician assistant.  Agree with plans

## 2021-11-05 NOTE — Plan of Care (Signed)

## 2021-11-05 NOTE — Progress Notes (Addendum)
° ° ° °  [facility] Progress Note  CC:  Constipation and GI bleed  Subjective:  Still consistently dripping blood from his bottom.  No BM since he has been here.  Still some LLQ abdominal pain.  Objective:  Vital signs in last 24 hours: Temp:  [97.8 F (36.6 C)-98.8 F (37.1 C)] 98.8 F (37.1 C) (01/24 0458) Pulse Rate:  [90-106] 99 (01/24 0458) Resp:  [16-20] 16 (01/24 0458) BP: (139-170)/(78-99) 139/95 (01/24 0458) SpO2:  [96 %-99 %] 97 % (01/24 0458) Weight:  [144.9 kg] 144.9 kg (01/23 1543) Last BM Date: 11/04/21 General:  Alert, Well-developed, in NAD Heart:  Regular rate and rhythm; no murmurs Pulm:  CTAB.  No W/R/R. Abdomen:  Soft, non-distended.  BS present.  Minimal LLQ TTP. Extremities:  Without edema. Neurologic:  Alert and oriented x 4;  grossly normal neurologically. Psych:  Alert and cooperative. Normal mood and affect.  Lab Results: Recent Labs    11/04/21 1731 11/05/21 0425  WBC 13.2* 11.4*  HGB 14.1 12.9*  HCT 42.7 39.8  PLT 451* 394   BMET Recent Labs    11/04/21 1731  NA 139  K 4.3  CL 108  CO2 24  GLUCOSE 112*  BUN 20  CREATININE 0.97  CALCIUM 8.7*   LFT Recent Labs    11/04/21 1731  PROT 7.4  ALBUMIN 3.9  AST 21  ALT 30  ALKPHOS 72  BILITOT 0.7   PT/INR Recent Labs    11/04/21 1731  LABPROT 12.8  INR 1.0    Assessment / Plan: #30 61 year old male with acute onset illness with constipation and inability to pass stool, followed by rectal pressure, then manual disimpaction and enema usage followed by rectal pain and lower abdominal pain and rectal bleeding.  Typically only uses colace at home.  CT scan without cause of symptoms.  Suspect low-grade diverticular bleed in the setting of Xarelto or possibly just trauma from enema/digital trauma from manual disimpaction, maybe mild stercoral colitis or stercoral ulcer from constipation but no CT evidence to support that.  Hemoglobin down slightly from 14 g range to 12.9  g.  Likely somewhat dilutional.   #2 internal hemorrhoids #3 chronic anticoagulation-on Xarelto, but that is on hold. #4 recent left lower extremity DVT and bilateral PEs August 2022 #5 chronic back pain history of cervical spondylosis:  on pain medications at home. #6 osteoarthritis     LOS: 1 day   Laban Emperor. Zehr  11/05/2021, 9:10 AM  _______________________________________________________________________________________________________________________________________  Velora Heckler GI MD note:  I personally examined the patient, reviewed the data and agree with the assessment and plan described above. Clearly has some firm external hemorrhoids on exam and likely some firm internal hemorrhoids as well (by my DRE at bedside).  This seems most likely hemorrhoid disease related to acute on chronic constipation.  May have some trauma from enema since bleeding started immediately afterwards.  ? Stercoral ulcer. I doubt this is a diverticular bleed. I am planning flex sig this afternoon for further evaluation.   Owens Loffler, MD [facility] Pager [phone]

## 2021-11-05 NOTE — Interval H&P Note (Signed)
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  The various methods of treatment have been discussed with the patient and family. After consideration of risks, benefits and other options for treatment, the patient has consented to  Procedure(s): FLEXIBLE SIGMOIDOSCOPY (N/A) as a surgical intervention.  The patient's history has been reviewed, patient examined, no change in status, stable for surgery.  I have reviewed the patient's chart and labs.  Questions were answered to the patient's satisfaction.     Milus Banister

## 2021-11-05 NOTE — Op Note (Addendum)
[facility] Patient Name: [name] Procedure Date: 11/05/2021 MRN: [id number] Attending MD: Milus Banister , MD Date of Birth: [birth year] CSN: [id number] Age: 61 Admit Type: Outpatient Procedure:                Flexible Sigmoidoscopy Indications:              Rectal hemorrhage Providers:                Milus Banister, MD, Kary Kos RN, RN, Frazier Richards, Technician Referring MD:              Medicines:                Monitored Anesthesia Care Complications:            No immediate complications. Estimated blood loss:                            None. Estimated Blood Loss:     Estimated blood loss: none. Procedure:                Pre-Anesthesia Assessment:                           - Prior to the procedure, a History and Physical                            was performed, and patient medications and                            allergies were reviewed. The patient's tolerance of                            previous anesthesia was also reviewed. The risks                            and benefits of the procedure and the sedation                            options and risks were discussed with the patient.                            All questions were answered, and informed consent                            was obtained. Prior Anticoagulants: The patient has                            taken Xarelto (rivaroxaban), last dose was 2 days                            prior to procedure. ASA Grade Assessment: II - A  patient with mild systemic disease. After reviewing                            the risks and benefits, the patient was deemed in                            satisfactory condition to undergo the procedure.                           After obtaining informed consent, the scope was                            passed under direct vision. The CF-HQ190L ([id number])                            Olympus colonoscope was  introduced through the anus                            and advanced to the the rectosigmoid junction. The                            flexible sigmoidoscopy was accomplished without                            difficulty. The patient tolerated the procedure                            well. The quality of the bowel preparation was                            adequate. Scope In: 2:58:55 PM Scope Out: 3:05:07 PM Total Procedure Duration: 0 hours 6 minutes 12 seconds  Findings:      Semithrombosed internal and external hemorrhoids with adherent clot.       Also there was what appeared to be a small amount macerated tissue at       distal rectum vs. indurated hemorrhoid. Could represent trauma from       enema while at home.      The exam was otherwise without abnormality. No stercoral ulcer, no       proctitis. Impression:               - Semithrombosed internal and external hemorrhoids                            with adherent clot. Also there was what appeared to                            be some macerated tissue at distal rectum vs.                            indurated hemorrhoid. Could represent trauma from                            enema while at home.                           -  He is safe for d/c today. Needs to use                            hydrocortisone suppository BID. Sits baths 2-3                            times per day. PRN lidocaine gel for pain. Start                            daily fiber supplement (powdered citrucel) and also                            one dose of miralax every day.                           - [facility] will contact him for follow up appt in                            2-3 weeks with Dr. Henrene Pastor or extender. Sooner if any                            problems.                           - If he does not fully recover within the next few                            weeks then he'll probably need referral to                            [facility]. Moderate  Sedation:      Not Applicable - Patient had care per Anesthesia. Recommendation:           - Discharge patient to home later today. Procedure Code(s):        --- Professional ---                           [phone], 52, Sigmoidoscopy, flexible; diagnostic,                            including collection of specimen(s) by brushing or                            washing, when performed (separate procedure) Diagnosis Code(s):        --- Professional ---                           K64.8, Other hemorrhoids                           K62.5, Hemorrhage of anus and rectum CPT copyright 2019 American Medical Association. All rights reserved. The codes documented in this report are preliminary and upon coder review may  be revised to meet current compliance requirements. Milus Banister, MD 11/05/2021 3:16:01 PM This  report has been signed electronically. Number of Addenda: 0

## 2021-11-05 NOTE — Transfer of Care (Signed)
Immediate Anesthesia Transfer of Care Note  Patient: Daniel Bossier, MD  Procedure(s) Performed: FLEXIBLE SIGMOIDOSCOPY  Patient Location: Endoscopy Unit  Anesthesia Type:MAC  Level of Consciousness: awake, alert , oriented and patient cooperative  Airway & Oxygen Therapy: Patient Spontanous Breathing and Patient connected to face mask  Post-op Assessment: Report given to RN and Post -op Vital signs reviewed and stable  Post vital signs: Reviewed and stable  Last Vitals:  Vitals Value Taken Time  BP    Temp    Pulse    Resp    SpO2      Last Pain:  Vitals:   11/05/21 1405  TempSrc: Oral  PainSc: 4       Patients Stated Pain Goal: 2 (22/41/14 6431)  Complications: No notable events documented.

## 2021-11-05 NOTE — Anesthesia Postprocedure Evaluation (Signed)
Anesthesia Post Note  Patient: [name], MD  Procedure(s) Performed: [place]     Patient location during evaluation: Endoscopy Anesthesia Type: MAC Level of consciousness: awake Pain management: pain level controlled Vital Signs Assessment: post-procedure vital signs reviewed and stable Respiratory status: spontaneous breathing, nonlabored ventilation, respiratory function stable and patient connected to nasal cannula oxygen Cardiovascular status: stable and blood pressure returned to baseline Postop Assessment: no apparent nausea or vomiting Anesthetic complications: no   No notable events documented.  Last Vitals:  Vitals:   11/05/21 1531 11/05/21 1541  BP: 133/68 (!) 146/76  Pulse: 75   Resp: 15   Temp:    SpO2: 100%     Last Pain:  Vitals:   11/05/21 1541  TempSrc:   PainSc: 3                  Klyde Banka P Prynce Jacober

## 2021-11-05 NOTE — Discharge Instructions (Signed)
Use hydrocortisone suppository BID. Sitz baths 2-3 times per day. PRN lidocaine gel for pain.  Start daily fiber supplement (powdered citrucel) and also one dose of miralax every day. [facility] will contact you for follow up appt in 2-3 weeks with Dr. Henrene Pastor or extender. Sooner if any problems.   GI feels you may safely resume your Xarelto 11/06/2021, or in the timing you feel is most appropriate.

## 2021-11-06 ENCOUNTER — Other Ambulatory Visit (HOSPITAL_COMMUNITY): Payer: Self-pay

## 2021-11-06 ENCOUNTER — Encounter (HOSPITAL_COMMUNITY): Payer: Self-pay | Admitting: Gastroenterology

## 2021-11-07 ENCOUNTER — Other Ambulatory Visit (HOSPITAL_COMMUNITY): Payer: Self-pay

## 2021-11-07 ENCOUNTER — Other Ambulatory Visit: Payer: Self-pay

## 2021-11-07 ENCOUNTER — Telehealth: Payer: Self-pay

## 2021-11-07 DIAGNOSIS — K922 Gastrointestinal hemorrhage, unspecified: Secondary | ICD-10-CM

## 2021-11-07 MED ORDER — TRAMADOL HCL 50 MG PO TABS
50.0000 mg | ORAL_TABLET | Freq: Four times a day (QID) | ORAL | 0 refills | Status: DC | PRN
Start: 2021-11-07 — End: 2022-05-20
  Filled 2021-11-07: qty 20, 5d supply, fill #0

## 2021-11-07 MED ORDER — TRAMADOL HCL 50 MG PO TABS: 50.0000 mg | ORAL_TABLET | Freq: Four times a day (QID) | ORAL | 0 refills | Status: DC | PRN

## 2021-11-07 NOTE — Telephone Encounter (Signed)
Pt knows to hold xarelto another 24 hours. Called [facility] and there are no colorectal surgeons in today or tomorrow. They have scheduled him to see Dr. Marcello Moores 11/12/21 at 9:40am/arrival time 9am. Triage nurse is sending a message to the colorectal surgeons to call Dr. Henrene Pastor to discuss further to see if they may be able to have him seen sooner. Prescription sent to pharmacy. Pt aware.

## 2021-11-07 NOTE — Consult Note (Addendum)
° °  [facility] CM Inpatient Consult   11/07/2021  [name], MD [birth year] [id number]  [facility] [ACO] Patient: [place] plan  Primary Care Provider:  Donnajean Lopes, MD, private [facility] is an embedded provider with a Chronic Care Management team and program, and is listed for the transition of care follow up and appointments.  Patient was screened for Embedded practice service needs for chronic care management  Plan: A referral will be sent to the for [facility] with [facility] Telephonic RN Care Management.   Please contact for further questions,  Natividad Brood, RN BSN [facility] Liaison  [phone] business mobile phone Toll free office [phone]  Fax number: [phone] [email or web address]

## 2021-11-07 NOTE — Telephone Encounter (Signed)
Pt calling to let us know several things:  He is still bleeding He is planning to restart his xarelto this am He is requesting something for pain, states he was not sent home with any pain medication. He states if he is not better by Monday he wants to be readmitted and have [facility] do surgery, he stated incision and cauterization. Reports he cannot work like this having to change his shorts every couple of hours and bleeding through his scrubs.

## 2021-11-07 NOTE — Telephone Encounter (Signed)
Spoke with [name] and let him know that Dr. Dema Severin stated they would get him in to be seen today or tomorrow. He knows to call their office this afternoon if he has not heard anything and let them know Dr. Henrene Pastor spoke with Dr. Dema Severin.

## 2021-11-07 NOTE — Telephone Encounter (Signed)
That was my understanding.  You might want to let the patient know so that he may expect a call.  He could also contact their office if he does not hear from them.  You can let him know that I spoke to Dr. Nadeen Landau

## 2021-11-07 NOTE — Telephone Encounter (Signed)
Thanks Linna Hoff. Linda, 1.  Hold Xarelto for another 24 hours 2.  Please contact [facility] and have them see this doctor today or tomorrow.  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  Tramadol 50 mg p.o. every 6 hours as needed for pain.  Dispense 20.  No refills JP

## 2021-11-07 NOTE — Patient Outreach (Signed)
Received a hospital discharge referral from [facility] Liaison for Mr. Treichler.  I have assigned Valente David, RN to call for follow up and determine if there are any Case Management needs.    [facility], [place], [place] [facility] [phone]

## 2021-11-07 NOTE — Telephone Encounter (Signed)
I spoke to Dr. Nadeen Landau (colorectal surgeon).  They are going to try to accommodate Dr. Sherral Hammers today or tomorrow.

## 2021-11-07 NOTE — Telephone Encounter (Signed)
I assume they will contact the patient regarding appt time if they can see him?

## 2021-11-07 NOTE — Telephone Encounter (Signed)
Vaughan Basta, I would like to get some input from Dr. Ardis Hughs, who did his flexible sigmoidoscopy on Tuesday. Thanks, Lanny Cramp, Based on your findings, thoughts? Thanks, JP

## 2021-11-08 ENCOUNTER — Other Ambulatory Visit (HOSPITAL_COMMUNITY): Payer: Self-pay

## 2021-11-08 ENCOUNTER — Inpatient Hospital Stay (HOSPITAL_COMMUNITY)
Admission: AD | Admit: 2021-11-08 | Discharge: 2021-11-11 | DRG: 394 | Disposition: A | Discharge status: Home / Self Care | Payer: 59 | Source: Ambulatory Visit | Attending: Family Medicine | Admitting: Family Medicine

## 2021-11-08 ENCOUNTER — Encounter (HOSPITAL_COMMUNITY): Payer: Self-pay

## 2021-11-08 ENCOUNTER — Observation Stay (HOSPITAL_COMMUNITY): Payer: 59

## 2021-11-08 DIAGNOSIS — Z8249 Family history of ischemic heart disease and other diseases of the circulatory system: Secondary | ICD-10-CM | POA: Diagnosis not present

## 2021-11-08 DIAGNOSIS — Z96641 Presence of right artificial hip joint: Secondary | ICD-10-CM | POA: Diagnosis present

## 2021-11-08 DIAGNOSIS — E1169 Type 2 diabetes mellitus with other specified complication: Secondary | ICD-10-CM | POA: Diagnosis present

## 2021-11-08 DIAGNOSIS — R55 Syncope and collapse: Secondary | ICD-10-CM | POA: Diagnosis present

## 2021-11-08 DIAGNOSIS — K644 Residual hemorrhoidal skin tags: Secondary | ICD-10-CM | POA: Diagnosis not present

## 2021-11-08 DIAGNOSIS — Z86718 Personal history of other venous thrombosis and embolism: Secondary | ICD-10-CM

## 2021-11-08 DIAGNOSIS — Z7984 Long term (current) use of oral hypoglycemic drugs: Secondary | ICD-10-CM

## 2021-11-08 DIAGNOSIS — Z96652 Presence of left artificial knee joint: Secondary | ICD-10-CM | POA: Diagnosis present

## 2021-11-08 DIAGNOSIS — G8929 Other chronic pain: Secondary | ICD-10-CM | POA: Diagnosis present

## 2021-11-08 DIAGNOSIS — K648 Other hemorrhoids: Secondary | ICD-10-CM | POA: Diagnosis not present

## 2021-11-08 DIAGNOSIS — Z7901 Long term (current) use of anticoagulants: Secondary | ICD-10-CM

## 2021-11-08 DIAGNOSIS — K625 Hemorrhage of anus and rectum: Secondary | ICD-10-CM | POA: Diagnosis not present

## 2021-11-08 DIAGNOSIS — B3789 Other sites of candidiasis: Secondary | ICD-10-CM | POA: Diagnosis not present

## 2021-11-08 DIAGNOSIS — Z79899 Other long term (current) drug therapy: Secondary | ICD-10-CM

## 2021-11-08 DIAGNOSIS — R490 Dysphonia: Secondary | ICD-10-CM | POA: Diagnosis present

## 2021-11-08 DIAGNOSIS — N400 Enlarged prostate without lower urinary tract symptoms: Secondary | ICD-10-CM | POA: Diagnosis present

## 2021-11-08 DIAGNOSIS — Z86711 Personal history of pulmonary embolism: Secondary | ICD-10-CM | POA: Diagnosis not present

## 2021-11-08 DIAGNOSIS — Z6841 Body Mass Index (BMI) 40.0 and over, adult: Secondary | ICD-10-CM | POA: Diagnosis not present

## 2021-11-08 DIAGNOSIS — M62838 Other muscle spasm: Secondary | ICD-10-CM | POA: Diagnosis present

## 2021-11-08 DIAGNOSIS — R1032 Left lower quadrant pain: Secondary | ICD-10-CM | POA: Diagnosis not present

## 2021-11-08 DIAGNOSIS — G43719 Chronic migraine without aura, intractable, without status migrainosus: Secondary | ICD-10-CM | POA: Diagnosis present

## 2021-11-08 DIAGNOSIS — D6862 Lupus anticoagulant syndrome: Secondary | ICD-10-CM | POA: Diagnosis not present

## 2021-11-08 DIAGNOSIS — Z20822 Contact with and (suspected) exposure to covid-19: Secondary | ICD-10-CM | POA: Diagnosis present

## 2021-11-08 DIAGNOSIS — B370 Candidal stomatitis: Secondary | ICD-10-CM | POA: Diagnosis not present

## 2021-11-08 DIAGNOSIS — G894 Chronic pain syndrome: Secondary | ICD-10-CM | POA: Diagnosis present

## 2021-11-08 DIAGNOSIS — K922 Gastrointestinal hemorrhage, unspecified: Secondary | ICD-10-CM | POA: Diagnosis not present

## 2021-11-08 DIAGNOSIS — K5900 Constipation, unspecified: Secondary | ICD-10-CM

## 2021-11-08 DIAGNOSIS — E119 Type 2 diabetes mellitus without complications: Secondary | ICD-10-CM

## 2021-11-08 DIAGNOSIS — E785 Hyperlipidemia, unspecified: Secondary | ICD-10-CM | POA: Diagnosis present

## 2021-11-08 LAB — COMPREHENSIVE METABOLIC PANEL
ALT: 28 U/L (ref 0–44)
AST: 22 U/L (ref 15–41)
Albumin: 3.7 g/dL (ref 3.5–5.0)
Alkaline Phosphatase: 64 U/L (ref 38–126)
Anion gap: 7 (ref 5–15)
BUN: 16 mg/dL (ref 8–23)
CO2: 25 mmol/L (ref 22–32)
Calcium: 8.6 mg/dL — ABNORMAL LOW (ref 8.9–10.3)
Chloride: 101 mmol/L (ref 98–111)
Creatinine, Ser: 0.96 mg/dL (ref 0.61–1.24)
GFR, Estimated: >60 mL/min (ref >60)
Glucose, Bld: 113 mg/dL — ABNORMAL HIGH (ref 70–99)
Potassium: 4.1 mmol/L (ref 3.5–5.1)
Sodium: 133 mmol/L — ABNORMAL LOW (ref 135–145)
Total Bilirubin: 0.5 mg/dL (ref 0.3–1.2)
Total Protein: 6.8 g/dL (ref 6.5–8.1)

## 2021-11-08 LAB — CBC WITH DIFFERENTIAL/PLATELET
Abs Immature Granulocytes: 0.06 10*3/uL (ref 0.00–0.07)
Basophils Absolute: 0.1 10*3/uL (ref 0.0–0.1)
Basophils Relative: 1 %
Eosinophils Absolute: 0.3 10*3/uL (ref 0.0–0.5)
Eosinophils Relative: 3 %
HCT: 41.6 % (ref 39.0–52.0)
Hemoglobin: 13.6 g/dL (ref 13.0–17.0)
Immature Granulocytes: 1 %
Lymphocytes Relative: 19 %
Lymphs Abs: 2.1 10*3/uL (ref 0.7–4.0)
MCH: 31.5 pg (ref 26.0–34.0)
MCHC: 32.7 g/dL (ref 30.0–36.0)
MCV: 96.3 fL (ref 80.0–100.0)
Monocytes Absolute: 0.9 10*3/uL (ref 0.1–1.0)
Monocytes Relative: 8 %
Neutro Abs: 7.5 10*3/uL (ref 1.7–7.7)
Neutrophils Relative %: 68 %
Platelets: 391 10*3/uL (ref 150–400)
RBC: 4.32 MIL/uL (ref 4.22–5.81)
RDW: 14.5 % (ref 11.5–15.5)
WBC: 10.8 10*3/uL — ABNORMAL HIGH (ref 4.0–10.5)
nRBC: 0 % (ref 0.0–0.2)

## 2021-11-08 LAB — MAGNESIUM: Magnesium: 1.9 mg/dL (ref 1.7–2.4)

## 2021-11-08 LAB — GLUCOSE, CAPILLARY
Glucose-Capillary: 89 mg/dL (ref 70–99)
Glucose-Capillary: 96 mg/dL (ref 70–99)

## 2021-11-08 LAB — TYPE AND SCREEN
ABO/RH(D): O NEG
Antibody Screen: NEGATIVE

## 2021-11-08 LAB — RETICULOCYTES
Immature Retic Fract: 20.9 % — ABNORMAL HIGH (ref 2.3–15.9)
RBC.: 4.3 MIL/uL (ref 4.22–5.81)
Retic Count, Absolute: 76.1 10*3/uL (ref 19.0–186.0)
Retic Ct Pct: 1.8 % (ref 0.4–3.1)

## 2021-11-08 LAB — PROTIME-INR
INR: 1 (ref 0.8–1.2)
Prothrombin Time: 13 seconds (ref 11.4–15.2)

## 2021-11-08 LAB — RESP PANEL BY RT-PCR (FLU A&B, COVID) ARPGX2
Influenza A by PCR: NEGATIVE
Influenza B by PCR: NEGATIVE
SARS Coronavirus 2 by RT PCR: NEGATIVE

## 2021-11-08 MED ORDER — GABAPENTIN 600 MG PO TABS: 300.0000 mg | ORAL_TABLET | ORAL | Status: DC

## 2021-11-08 MED ORDER — LIDOCAINE 5 % EX PTCH
2.0000 | MEDICATED_PATCH | Freq: Every day | CUTANEOUS | Status: DC
  Administered 2021-11-11: 2 via TRANSDERMAL
  Filled 2021-11-08 (×4): qty 2

## 2021-11-08 MED ORDER — FINASTERIDE 5 MG PO TABS
5.0000 mg | ORAL_TABLET | Freq: Every day | ORAL | Status: DC
Start: 2021-11-09 — End: 2021-11-11
  Administered 2021-11-09 – 2021-11-11 (×3): 5 mg via ORAL
  Filled 2021-11-08 (×3): qty 1

## 2021-11-08 MED ORDER — TOPIRAMATE 25 MG PO TABS
25.0000 mg | ORAL_TABLET | Freq: Every day | ORAL | Status: DC
  Administered 2021-11-08 – 2021-11-10 (×3): 25 mg via ORAL
  Filled 2021-11-08 (×3): qty 1

## 2021-11-08 MED ORDER — FLUCONAZOLE 200 MG PO TABS
200.0000 mg | ORAL_TABLET | Freq: Every day | ORAL | 0 refills | Status: AC
  Filled 2021-11-08: qty 7, 7d supply, fill #0

## 2021-11-08 MED ORDER — HYDROMORPHONE HCL 1 MG/ML IJ SOLN
0.5000 mg | INTRAMUSCULAR | Status: DC | PRN
  Administered 2021-11-08 – 2021-11-10 (×6): 1 mg via INTRAVENOUS
  Filled 2021-11-08 (×6): qty 1

## 2021-11-08 MED ORDER — ACETAMINOPHEN 325 MG PO TABS: 650.0000 mg | ORAL_TABLET | Freq: Four times a day (QID) | ORAL | Status: DC | PRN

## 2021-11-08 MED ORDER — MAGIC MOUTHWASH
15.0000 mL | Freq: Four times a day (QID) | ORAL | Status: DC
  Administered 2021-11-08 – 2021-11-11 (×11): 15 mL via ORAL
  Filled 2021-11-08 (×12): qty 15

## 2021-11-08 MED ORDER — INSULIN ASPART 100 UNIT/ML IJ SOLN: 0.0000 [IU] | Freq: Three times a day (TID) | INTRAMUSCULAR | Status: DC

## 2021-11-08 MED ORDER — LACTATED RINGERS IV SOLN: INTRAVENOUS | Status: DC

## 2021-11-08 MED ORDER — ONDANSETRON HCL 4 MG PO TABS: 4.0000 mg | ORAL_TABLET | Freq: Four times a day (QID) | ORAL | Status: DC | PRN

## 2021-11-08 MED ORDER — TAMSULOSIN HCL 0.4 MG PO CAPS
0.4000 mg | ORAL_CAPSULE | Freq: Two times a day (BID) | ORAL | Status: DC
  Administered 2021-11-08 – 2021-11-11 (×6): 0.4 mg via ORAL
  Filled 2021-11-08 (×6): qty 1

## 2021-11-08 MED ORDER — HYDROMORPHONE HCL 1 MG/ML IJ SOLN
1.0000 mg | INTRAMUSCULAR | Status: DC | PRN
  Administered 2021-11-08: 1 mg via INTRAVENOUS
  Filled 2021-11-08: qty 1

## 2021-11-08 MED ORDER — OXYMETAZOLINE HCL 0.05 % NA SOLN: 2.0000 | Freq: Two times a day (BID) | NASAL | Status: DC | PRN

## 2021-11-08 MED ORDER — CYCLOBENZAPRINE HCL 10 MG PO TABS: 10.0000 mg | ORAL_TABLET | Freq: Three times a day (TID) | ORAL | Status: DC | PRN

## 2021-11-08 MED ORDER — OXYCODONE HCL 5 MG PO TABS
5.0000 mg | ORAL_TABLET | ORAL | Status: DC | PRN
  Administered 2021-11-09 – 2021-11-11 (×6): 10 mg via ORAL
  Filled 2021-11-08 (×7): qty 2

## 2021-11-08 MED ORDER — METHOCARBAMOL 500 MG PO TABS
500.0000 mg | ORAL_TABLET | Freq: Three times a day (TID) | ORAL | Status: DC
  Administered 2021-11-08 – 2021-11-11 (×8): 500 mg via ORAL
  Filled 2021-11-08 (×8): qty 1

## 2021-11-08 MED ORDER — ACETAMINOPHEN 650 MG RE SUPP: 650.0000 mg | Freq: Four times a day (QID) | RECTAL | Status: DC | PRN

## 2021-11-08 MED ORDER — FLUTICASONE PROPIONATE 50 MCG/ACT NA SUSP: 2.0000 | Freq: Every day | NASAL | Status: DC | PRN

## 2021-11-08 MED ORDER — IPRATROPIUM-ALBUTEROL 0.5-2.5 (3) MG/3ML IN SOLN: 3.0000 mL | Freq: Four times a day (QID) | RESPIRATORY_TRACT | Status: DC | PRN

## 2021-11-08 MED ORDER — INSULIN ASPART 100 UNIT/ML IJ SOLN: 0.0000 [IU] | Freq: Every day | INTRAMUSCULAR | Status: DC

## 2021-11-08 MED ORDER — PANTOPRAZOLE SODIUM 40 MG PO TBEC
40.0000 mg | DELAYED_RELEASE_TABLET | Freq: Every day | ORAL | Status: DC
  Administered 2021-11-09 – 2021-11-11 (×3): 40 mg via ORAL
  Filled 2021-11-08 (×3): qty 1

## 2021-11-08 MED ORDER — BACLOFEN 10 MG PO TABS: 10.0000 mg | ORAL_TABLET | Freq: Every day | ORAL | Status: DC | PRN

## 2021-11-08 MED ORDER — ZOLPIDEM TARTRATE 10 MG PO TABS
10.0000 mg | ORAL_TABLET | Freq: Every day | ORAL | Status: DC
  Administered 2021-11-08 – 2021-11-10 (×3): 10 mg via ORAL
  Filled 2021-11-08 (×3): qty 1

## 2021-11-08 MED ORDER — ONDANSETRON HCL 4 MG/2ML IJ SOLN
4.0000 mg | Freq: Once | INTRAMUSCULAR | Status: DC
  Filled 2021-11-08: qty 2

## 2021-11-08 MED ORDER — DICYCLOMINE HCL 20 MG PO TABS: 20.0000 mg | ORAL_TABLET | Freq: Four times a day (QID) | ORAL | Status: DC | PRN

## 2021-11-08 MED ORDER — LIDOCAINE 4 % EX CREA
TOPICAL_CREAM | Freq: Four times a day (QID) | CUTANEOUS | Status: DC | PRN
  Administered 2021-11-08: 1 via TOPICAL
  Filled 2021-11-08: qty 5

## 2021-11-08 MED ORDER — ONDANSETRON HCL 4 MG/2ML IJ SOLN: 4.0000 mg | Freq: Four times a day (QID) | INTRAMUSCULAR | Status: DC | PRN

## 2021-11-08 MED ORDER — HYDROCORTISONE ACETATE 25 MG RE SUPP
25.0000 mg | Freq: Two times a day (BID) | RECTAL | Status: DC
  Administered 2021-11-08 – 2021-11-11 (×5): 25 mg via RECTAL
  Filled 2021-11-08 (×7): qty 1

## 2021-11-08 MED ORDER — DIAZEPAM 5 MG PO TABS: 5.0000 mg | ORAL_TABLET | Freq: Two times a day (BID) | ORAL | Status: DC | PRN

## 2021-11-08 MED ORDER — PRAVASTATIN SODIUM 20 MG PO TABS
40.0000 mg | ORAL_TABLET | Freq: Every day | ORAL | Status: DC
  Administered 2021-11-09 – 2021-11-11 (×3): 40 mg via ORAL
  Filled 2021-11-08 (×3): qty 2

## 2021-11-08 MED ORDER — LORATADINE 10 MG PO TABS: 10.0000 mg | ORAL_TABLET | Freq: Every day | ORAL | Status: DC | PRN

## 2021-11-08 MED ORDER — FLUCONAZOLE 100 MG PO TABS
200.0000 mg | ORAL_TABLET | Freq: Every day | ORAL | Status: DC
  Administered 2021-11-09 – 2021-11-11 (×3): 200 mg via ORAL
  Filled 2021-11-08 (×3): qty 2

## 2021-11-08 MED ORDER — SUMATRIPTAN SUCCINATE 25 MG PO TABS
50.0000 mg | ORAL_TABLET | ORAL | Status: DC | PRN
  Administered 2021-11-09 – 2021-11-11 (×3): 50 mg via ORAL
  Filled 2021-11-08 (×3): qty 2

## 2021-11-08 MED ORDER — GABAPENTIN 300 MG PO CAPS
600.0000 mg | ORAL_CAPSULE | Freq: Every day | ORAL | Status: DC
  Administered 2021-11-08 – 2021-11-10 (×3): 600 mg via ORAL
  Filled 2021-11-08 (×3): qty 2

## 2021-11-08 MED ORDER — POLYETHYLENE GLYCOL 3350 17 G PO PACK
17.0000 g | PACK | Freq: Every day | ORAL | Status: DC
  Administered 2021-11-09 – 2021-11-11 (×3): 17 g via ORAL
  Filled 2021-11-08 (×3): qty 1

## 2021-11-08 MED ORDER — GABAPENTIN 300 MG PO CAPS
300.0000 mg | ORAL_CAPSULE | Freq: Every day | ORAL | Status: DC
  Administered 2021-11-09 – 2021-11-11 (×3): 300 mg via ORAL
  Filled 2021-11-08 (×3): qty 1

## 2021-11-08 NOTE — H&P (Signed)
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  Presented to the hospital with complaints of ongoing abdominal pain as well as BRBPR from his recent hospitalization. Patient was hospitalized between 11/04/2021 and 11/05/2021 for acute lower GI bleed, underwent flexible sigmoidoscopy and was found to have stable thrombosed internal and external hemorrhoids without current clot as well as macerated tissue in the distal rectum. After discharge patient continues to have blood in the stool as well as worsening abdominal pain in the left lower quadrant as well as rectal pain. Patient has been compliant with his medical regimen including MiraLAX and sitz bath. Patient was prescribed tramadol due to worsening pain. Patient was referred to colorectal surgery outpatient for further assistance with his ongoing bleeding. At which time patient was recommended to come to the hospital by the colorectal surgery for admission. This morning patient had 2 episodes of BRBPR.  Had an episode of dizziness and lightheadedness along with diaphoresis.  Increase shortness of breath along with that episode but no chest pain or chest tightness.  No fever no chills.  No nausea no vomiting right now.  But had episode of nausea along with the episode of diaphoresis. Patient remains compliant with all his medication.  Denies any change in his medication recently. Patient has chronic left leg edema from his surgery No headache no dizziness no focal deficit at the time of my  evaluation. Patient does have a muffled voice which he mention noted couple of days ago.  He also noted his tongue coated white.  Reports pain while swallowing.  Was prescribed fluconazole. No alcohol no drug abuse.  No smoking history.   Review of Systems: As mentioned in the history of present illness. All other systems reviewed and are negative. Past Medical History:  Diagnosis Date   Anxiety    Aortic atherosclerosis (HCC)    BPH (benign prostatic hyperplasia)    Chronic pain syndrome    neck and low back   DDD (degenerative disc disease), cervical    Diverticulosis    DJD (degenerative joint disease)    Ganglion cyst    12 mm , right posterior knee   Hyperlipidemia    Internal hemorrhoids    Migraines    OA (osteoarthritis)    knees, hands, right shoulder   Positive QuantiFERON-TB Gold test    Shoulder pain    Tear of right rotator cuff    Type 2 diabetes mellitus ([place])    last A1c 5.4 on 04-13-2018 in epic (followed by pcp)   Wears glasses    Past Surgical History:  Procedure Laterality Date   ANAL FISSURE REPAIR     ARTHOSCOPIC ROTAOR CUFF REPAIR Right 06/15/2018   Procedure: RIGHT SHOULDER ARTHROSCOPY, DEBRIDEMENT, SUBACROMIAL DECOMPRESSION, DISTAL CLAVICLE RESECTION, ROTATOR CUFF REPAIR;  Surgeon: [name], MD;  Location: [facility];  Service: Orthopedics;  Laterality: Right;   COLONOSCOPY  05-26-2017   dr Carrington Clamp SIGMOIDOSCOPY N/A 11/05/2021   Procedure: FLEXIBLE SIGMOIDOSCOPY;  Surgeon: Milus Banister, MD;  Location:  [facility];  Service: Endoscopy;  Laterality: N/A;   Hip Resurface  2006   KNEE ARTHROSCOPY W/ MENISCAL REPAIR Bilateral right 1993; left 2010   MASS EXCISION Right 04/21/2018   Procedure: EXCISION RIGHT THIGH SOFT TISSUE MASS;  Surgeon: [name], MD;  Location: [facility];  Service: Orthopedics;  Laterality: Right;   SEPTOPLASTY     SHOULDER ARTHROSCOPY WITH ROTATOR CUFF REPAIR Left    SLAP Tear Repair  2008    TONSILLECTOMY     TOTAL KNEE ARTHROPLASTY Left 12/08/2016   Procedure: LEFT TOTAL KNEE ARTHROPLASTY;  Surgeon: [name], MD;  Location: [facility];  Service: Orthopedics;  Laterality: Left;   TOTAL KNEE ARTHROPLASTY WITH REVISION COMPONENTS Left 04/21/2018   Procedure: Left knee polyethylene exchange ;  Surgeon: [name], MD;  Location: [facility];  Service: Orthopedics;  Laterality: Left;   Social History:  reports that he has never smoked. He has never used smokeless tobacco. He reports current alcohol use. He reports that he does not use drugs.  Allergies  Allergen Reactions   Benzoin     Other reaction(s): blisters   Benzoin Compound Other (See Comments)    Blisters     Family History  Problem Relation Age of Onset   Heart attack Father 61    Prior to Admission medications   Medication Sig Start Date End Date Taking? Authorizing Provider  baclofen (LIORESAL) 10 MG tablet Take 10-20 mg by mouth 2 (two) times daily as needed for muscle spasms.   Yes [provider]  Butalbital-APAP-Caffeine (FIORICET) 50-300-40 MG CAPS Take 1 capsule by mouth daily as needed (head ache).   Yes [provider]  cyclobenzaprine (FLEXERIL) 10 MG tablet Take 1 tablet (10 mg total) by mouth 3 (three) times daily as needed. Patient taking differently: Take 10-20 mg by mouth 2 (two) times daily as needed for muscle spasms. 09/30/21  Yes   diazepam (VALIUM) 5 MG tablet Take 5 mg by mouth daily as needed for muscle spasms.   Yes [provider]  dicyclomine (BENTYL) 20 MG tablet TAKE 1 TABLET BY MOUTH EVERY 4 TO 6 HOURS AS NEEDED FOR CRAMPING. Patient taking differently: Take 20 mg by mouth See admin instructions. Take 20 mg by mouth every four to six hours as needed for cramping 09/30/21 09/30/22 Yes [name], MD  finasteride (PROSCAR) 5 MG tablet Take 1 tablet (5 mg total) by mouth daily. 08/29/21  Yes   fluconazole (DIFLUCAN) 200 MG tablet Take 1 tablet (200 mg total) by  mouth daily for 7 days. 11/08/21 11/15/21 Yes Thurnell Lose, MD  fluticasone (FLONASE) 50 MCG/ACT nasal spray Place 2 sprays into both nostrils daily as needed for allergies.    Yes [provider]  gabapentin (NEURONTIN) 600 MG tablet Take 1 tablet (600 mg total) by mouth 3 (three) times daily. 10/01/21  Yes   hydrocortisone (ANUSOL-HC) 25 MG suppository Place 1 suppository (25 mg total) rectally 2 (two) times daily. 11/05/21  Yes Cherene Altes, MD  Ipratropium-Albuterol (COMBIVENT RESPIMAT) 20-100 MCG/ACT AERS respimat Inhale 1 puff into the lungs every 6 (six) hours as needed. Patient taking differently: Inhale 1 puff into the lungs every 6 (six) hours as needed for shortness of breath or wheezing. 08/05/21  Yes   ketoconazole (NIZORAL) 2 % cream APPLY TOPICALLY TO AFFECTED AREA TWICE DAILY. Patient taking differently: Apply 1 application topically 2 (two) times daily as needed for irritation (of the feet). 09/30/21  Yes   lidocaine (  LIDODERM) 5 % APPLY 2 PATCHES TOPICALLY TO THE AFFECTED AREAS DAILY (12 HOURS ON AND 12 HOURS OFF). Patient taking differently: Place 2 patches onto the skin daily as needed (for pain- 12 hours on/12 hours off). 09/30/21  Yes   lidocaine (LMX) 4 % cream Apply topically 4 (four) times daily as needed (perianal pain). Patient taking differently: Apply 1 application topically 4 (four) times daily as needed (perianal pain). 11/05/21  Yes Cherene Altes, MD  loratadine (CLARITIN) 10 MG tablet Take 10 mg by mouth daily as needed for allergies or rhinitis.   Yes [provider]  metFORMIN (GLUCOPHAGE) 1000 MG tablet Take 1 tablet (1,000 mg total) by mouth 2 (two) times daily. 07/01/21  Yes   methocarbamol (ROBAXIN) 500 MG tablet Take 1 tablet (500 mg total) by mouth up to 3 (three) times daily as needed for tearful muscle spasms and pain. Patient taking differently: Take 500 mg by mouth daily as needed for muscle spasms. 09/30/21  Yes   Multiple  Vitamins-Minerals (AIRBORNE) CHEW Chew 1 tablet by mouth 2 (two) times daily.   Yes [provider]  ondansetron (ZOFRAN) 4 MG tablet Take 1 tablet (4 mg total) by mouth 3 (three) times daily. Patient taking differently: Take 4 mg by mouth every 8 (eight) hours as needed for nausea or vomiting. 07/11/21  Yes   OVER THE COUNTER MEDICATION Take 1 capsule by mouth 2 (two) times daily. Super Beta Prostate Supplement capsules.   Yes [provider]  oxyCODONE (OXY IR/ROXICODONE) 5 MG immediate release tablet Take 1-2 tablets (5-10 mg total) by mouth every 8 (eight) hours as needed for pain. Caution: sedation Patient taking differently: Take 15 mg by mouth every 8 (eight) hours as needed for breakthrough pain. 09/30/21  Yes   oxymetazoline (AFRIN) 0.05 % nasal spray Place 2 sprays into both nostrils 2 (two) times daily as needed for congestion.    Yes [provider]  pantoprazole (PROTONIX) 40 MG tablet Take 1 tablet (40 mg total) by mouth daily. Patient taking differently: Take 40 mg by mouth at bedtime. 10/01/21  Yes   polyethylene glycol (MIRALAX / GLYCOLAX) 17 g packet Take 17 g by mouth daily. 11/05/21  Yes Cherene Altes, MD  pravastatin (PRAVACHOL) 40 MG tablet Take 1 tablet (40 mg total) by mouth daily. Patient taking differently: Take 40 mg by mouth in the morning. 07/04/21  Yes   Psyllium (METAMUCIL) 28.3 % POWD Mix 3 teaspoonsful of powder into the appropriate amount of water and drink once a day. Patient taking differently: Take 1 Scoop by mouth daily. 11/05/21  Yes Cherene Altes, MD  rizatriptan (MAXALT-MLT) 5 MG disintegrating tablet DISSOLVE ONE TABLET BY MOUTH AT ONSET OF HEADACHE MAY REPEAT IN 2 HOURS (MAX 6 TABLETS PER DAY) Patient taking differently: Take 5 mg by mouth See admin instructions. Dissolve 5 mg in the mouth at the onset of headache and may repeat in 2 hours (max of 6 tablets/24 hours) 09/30/21  Yes   tadalafil (CIALIS) 5 MG tablet Take 1  tablet (5 mg total) by mouth daily for benign prostatic hypertrophy. 07/01/21  Yes   tamsulosin (FLOMAX) 0.4 MG CAPS capsule Take 1 capsule (0.4 mg total) by mouth 2 (two) times daily. 09/30/21  Yes   topiramate (TOPAMAX) 25 MG tablet Take 1 tablet (25 mg total) by mouth at bedtime.( to prevent headaches) Patient taking differently: Take 25 mg by mouth at bedtime. 08/05/21  Yes   traMADol (ULTRAM) 50  MG tablet Take 1 tablet (50 mg total) by mouth every 6 (six) hours as needed for pain. Patient taking differently: Take 50 mg by mouth every 6 (six) hours as needed for moderate pain. 11/07/21  Yes [name], MD  zolpidem (AMBIEN) 10 MG tablet Take 10 mg by mouth at bedtime.   Yes [provider]  Respiratory Therapy Supplies (SPIROMETER) KIT Use 3 times daily 07/11/21     rivaroxaban (XARELTO) 20 MG TABS tablet Take 1 tablet (20 mg total) by mouth daily with food. Patient not taking: Reported on 11/08/2021 11/06/21   Cherene Altes, MD  traMADol (ULTRAM) 50 MG tablet Take 1 tablet (50 mg total) by mouth every 6 (six) hours as needed. Patient not taking: Reported on 11/08/2021 11/07/21   [name], MD    Physical Exam:  Vitals:   11/08/21 1528  BP: (!) 178/96  Pulse: 98  Temp: 98 F (36.7 C)  TempSrc: Oral  SpO2: 97%    General: Appear in mild distress, no Rash; Oral Mucosa white exudate with erythema of the pharynx. no Abnormal Neck Mass Or lumps, Conjunctiva normal  Cardiovascular: S1 and S2 Present, no Murmur, Respiratory: good respiratory effort, Bilateral Air entry present and CTA, no Crackles, no wheezes Abdomen: Bowel Sound present, Soft and left lower quadrant tenderness Extremities: Bilateral left more than right pedal edema Neurology: alert and oriented to time, place, and person affect appropriate. no new focal deficit Gait not checked due to patient safety concerns    Data Reviewed:  I have Reviewed nursing notes, Vitals, and Lab results since pt's last  encounter. Pertinent lab results CBC shows improvement in hemoglobin 13.6, CMP shows stable creatinine, INR normal I have ordered test including CBC and BMP in the morning I have ordered imaging studies and independently visualized and interpreted the imaging of x-ray abdomen shows evidence of right sided constipation but no abnormality of the left. I have reviewed the last note from GI, and discussed pt's care plan and test results with general surgery.   Assessment/Plan Acute lower GI bleeding. BRBPR. CT abdomen pelvis recent admission shows no acute finding. X-ray abdomen continues to show no acute finding as well. Flexible sigmoidoscopy shows evidence of some thrombosed internal and external hemorrhoids without hard clot as well as macerated tissue in the distal rectum area. GI recommended outpatient follow-up with the patient that this to have bloody per rectum. Had 2 episodes of BRBPR on 1/27 with dizziness diaphoresis and nausea. Hemoglobin remained stable. Will consult general surgery as planned before and monitor recommendation. Currently on clear liquid diet. Pain control with IV Dilaudid and oral OxyContin.  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 Currently anticoagulation is on hold. Last dose of Xarelto was on Monday. CBC remained stable. Lower extremity Doppler in November 2022 negative for any DVT as well.  Type 2 diabetes mellitus, controlled without any complication.  Without long-term insulin use CBG remained stable. Hemoglobin A1c was 5. Continue sliding scale insulin.  Hyperlipidemia with type 2 diabetes mellitus  Continue statin.  Chronic pain syndrome as well as muscle spasm. Patient is on chronic oxycodone as needed which I will continue. Continue Robaxin but changed to scheduled, continue baclofen as needed and Flexeril as needed.  Oropharyngeal candidiasis. Hoarseness of voice. Etiology not clear. Patient currently being treated with fluconazole which I will  continue.  200 mg for total of 6 more days. Magic mouthwash as well.  Advance Care Planning:   Code Status: Full  Code   Consults: General surgery  Family Communication: None at bedside.  Severity of Illness: The appropriate patient status for this patient is OBSERVATION. Observation status is judged to be reasonable and necessary in order to provide the required intensity of service to ensure the patient's safety. The patient's presenting symptoms, physical exam findings, and initial radiographic and laboratory data in the context of their medical condition is felt to place them at decreased risk for further clinical deterioration. Furthermore, it is anticipated that the patient will be medically stable for discharge from the hospital within 2 midnights of admission.   Author: Berle Mull, MD 11/08/2021 7:02 PM  For on call review [email or web address].

## 2021-11-09 ENCOUNTER — Other Ambulatory Visit: Payer: Self-pay

## 2021-11-09 DIAGNOSIS — B370 Candidal stomatitis: Secondary | ICD-10-CM | POA: Diagnosis present

## 2021-11-09 DIAGNOSIS — K922 Gastrointestinal hemorrhage, unspecified: Secondary | ICD-10-CM | POA: Diagnosis not present

## 2021-11-09 DIAGNOSIS — R55 Syncope and collapse: Secondary | ICD-10-CM | POA: Diagnosis present

## 2021-11-09 DIAGNOSIS — K648 Other hemorrhoids: Secondary | ICD-10-CM | POA: Diagnosis not present

## 2021-11-09 DIAGNOSIS — K625 Hemorrhage of anus and rectum: Secondary | ICD-10-CM | POA: Diagnosis present

## 2021-11-09 DIAGNOSIS — G8929 Other chronic pain: Secondary | ICD-10-CM | POA: Diagnosis present

## 2021-11-09 DIAGNOSIS — E785 Hyperlipidemia, unspecified: Secondary | ICD-10-CM | POA: Diagnosis present

## 2021-11-09 DIAGNOSIS — E119 Type 2 diabetes mellitus without complications: Secondary | ICD-10-CM

## 2021-11-09 DIAGNOSIS — R490 Dysphonia: Secondary | ICD-10-CM | POA: Diagnosis present

## 2021-11-09 LAB — GLUCOSE, CAPILLARY
Glucose-Capillary: 117 mg/dL — ABNORMAL HIGH (ref 70–99)
Glucose-Capillary: 113 mg/dL — ABNORMAL HIGH (ref 70–99)

## 2021-11-09 LAB — CBC
HCT: 41.1 % (ref 39.0–52.0)
Hemoglobin: 13.6 g/dL (ref 13.0–17.0)
MCH: 31.6 pg (ref 26.0–34.0)
MCHC: 33.1 g/dL (ref 30.0–36.0)
MCV: 95.6 fL (ref 80.0–100.0)
Platelets: 388 10*3/uL (ref 150–400)
RBC: 4.3 MIL/uL (ref 4.22–5.81)
RDW: 14.5 % (ref 11.5–15.5)
WBC: 10.6 10*3/uL — ABNORMAL HIGH (ref 4.0–10.5)
nRBC: 0 % (ref 0.0–0.2)

## 2021-11-09 LAB — COMPREHENSIVE METABOLIC PANEL
ALT: 24 U/L (ref 0–44)
AST: 18 U/L (ref 15–41)
Albumin: 3.3 g/dL — ABNORMAL LOW (ref 3.5–5.0)
Alkaline Phosphatase: 60 U/L (ref 38–126)
Anion gap: 5 (ref 5–15)
BUN: 15 mg/dL (ref 8–23)
CO2: 28 mmol/L (ref 22–32)
Calcium: 8.6 mg/dL — ABNORMAL LOW (ref 8.9–10.3)
Chloride: 104 mmol/L (ref 98–111)
Creatinine, Ser: 1.01 mg/dL (ref 0.61–1.24)
GFR, Estimated: >60 mL/min (ref >60)
Glucose, Bld: 105 mg/dL — ABNORMAL HIGH (ref 70–99)
Potassium: 4.6 mmol/L (ref 3.5–5.1)
Sodium: 137 mmol/L (ref 135–145)
Total Bilirubin: 0.7 mg/dL (ref 0.3–1.2)
Total Protein: 6.2 g/dL — ABNORMAL LOW (ref 6.5–8.1)

## 2021-11-09 MED ORDER — LIP MEDEX EX OINT
TOPICAL_OINTMENT | CUTANEOUS | Status: DC | PRN
  Filled 2021-11-09: qty 7

## 2021-11-09 MED ORDER — PROCHLORPERAZINE MALEATE 10 MG PO TABS
5.0000 mg | ORAL_TABLET | Freq: Four times a day (QID) | ORAL | Status: DC | PRN
  Administered 2021-11-09: 5 mg via ORAL
  Filled 2021-11-09: qty 1

## 2021-11-09 MED ORDER — TADALAFIL 5 MG PO TABS
5.0000 mg | ORAL_TABLET | Freq: Every day | ORAL | Status: DC
  Administered 2021-11-09 – 2021-11-11 (×3): 5 mg via ORAL
  Filled 2021-11-09 (×3): qty 1

## 2021-11-09 NOTE — Consult Note (Signed)
Reason for Consult: Bleeding hemorrhoids Referring Physician: Dr. 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  He had developed significant bleeding internal hemorrhoids and thrombosed external hemorrhoids with significant perianal discomfort.  He developed bright red blood per rectum and was admitted to the hospital.  He underwent a flexible sigmoidoscopy and was found to have stable hemorrhoids with no current bleeding.  There was some macerated tissue in the distal rectum.  He was discharged continue to have bleeding per rectum.  He had taken his last dose of Xarelto the previous Sunday.  He was referred to our colorectal surgeons but because of ongoing bleeding was admitted to the hospital yesterday.  He had had some dizziness and diaphoresis with lightheadedness during the episode.  Since yesterday morning he has had no further bleeding but has had no bowel movements.  He still has moderate perianal discomfort.  He has a remote history of surgery or anal fissure many years ago.  He has no issues with continence.  He has been hemodynamically stable since admission with a normal hemoglobin  Past Medical History:  Diagnosis Date   Anxiety    Aortic atherosclerosis (HCC)    BPH (benign prostatic hyperplasia)    Chronic pain syndrome    neck and low back   DDD (degenerative disc disease), cervical    Diverticulosis    DJD (degenerative joint disease)    Ganglion cyst    12 mm , right posterior knee   Hyperlipidemia    Internal hemorrhoids    Migraines    OA (osteoarthritis)    knees, hands, right shoulder   Positive QuantiFERON-TB Gold test    Shoulder pain    Tear of right rotator cuff    Type 2 diabetes mellitus ([place])    last A1c 5.4 on 04-13-2018 in epic (followed by pcp)   Wears glasses     Past Surgical History:  Procedure Laterality Date   ANAL FISSURE  REPAIR     ARTHOSCOPIC ROTAOR CUFF REPAIR Right 06/15/2018   Procedure: RIGHT SHOULDER ARTHROSCOPY, DEBRIDEMENT, SUBACROMIAL DECOMPRESSION, DISTAL CLAVICLE RESECTION, ROTATOR CUFF REPAIR;  Surgeon: [name], MD;  Location: [facility];  Service: Orthopedics;  Laterality: Right;   COLONOSCOPY  05-26-2017   dr Carrington Clamp SIGMOIDOSCOPY N/A 11/05/2021   Procedure: FLEXIBLE SIGMOIDOSCOPY;  Surgeon: Milus Banister, MD;  Location: [facility];  Service: Endoscopy;  Laterality: N/A;   Hip Resurface  2006   KNEE ARTHROSCOPY W/ MENISCAL REPAIR Bilateral right 1993; left 2010   MASS EXCISION Right 04/21/2018   Procedure: EXCISION RIGHT THIGH SOFT TISSUE MASS;  Surgeon: [name], MD;  Location: [facility];  Service: Orthopedics;  Laterality: Right;   SEPTOPLASTY     SHOULDER ARTHROSCOPY WITH ROTATOR CUFF REPAIR Left    SLAP Tear Repair  2008   TONSILLECTOMY     TOTAL KNEE ARTHROPLASTY Left 12/08/2016   Procedure: LEFT TOTAL KNEE ARTHROPLASTY;  Surgeon: [name], MD;  Location: [facility];  Service: Orthopedics;  Laterality: Left;   TOTAL KNEE ARTHROPLASTY WITH REVISION COMPONENTS Left 04/21/2018   Procedure: Left knee polyethylene exchange ;  Surgeon: [name], MD;  Location: [facility];  Service: Orthopedics;  Laterality: Left;    Family History  Problem Relation Age of Onset   Heart attack Father 14    Social  History:  reports that he has never smoked. He has never used smokeless tobacco. He reports current alcohol use. He reports that he does not use drugs.  Allergies:  Allergies  Allergen Reactions   Benzoin     Other reaction(s): blisters   Benzoin Compound Other (See Comments)    Blisters     Medications: I have reviewed the patient's current medications.  Results for orders placed or performed during the hospital encounter of 11/08/21 (from the past 48 hour(s))  Comprehensive metabolic panel     Status: Abnormal   Collection Time: 11/08/21  5:17 PM   Result Value Ref Range   Sodium 133 (L) 135 - 145 mmol/L   Potassium 4.1 3.5 - 5.1 mmol/L   Chloride 101 98 - 111 mmol/L   CO2 25 22 - 32 mmol/L   Glucose, Bld 113 (H) 70 - 99 mg/dL    Comment: Glucose reference range applies only to samples taken after fasting for at least 8 hours.   BUN 16 8 - 23 mg/dL   Creatinine, Ser 0.96 0.61 - 1.24 mg/dL   Calcium 8.6 (L) 8.9 - 10.3 mg/dL   Total Protein 6.8 6.5 - 8.1 g/dL   Albumin 3.7 3.5 - 5.0 g/dL   AST 22 15 - 41 U/L   ALT 28 0 - 44 U/L   Alkaline Phosphatase 64 38 - 126 U/L   Total Bilirubin 0.5 0.3 - 1.2 mg/dL   GFR, Estimated >60 >60 mL/min    Comment: (NOTE) Calculated using the CKD-EPI Creatinine Equation (2021)    Anion gap 7 5 - 15    Comment: Performed at [facility], [place] [address] [place], [place] [address]  CBC WITH DIFFERENTIAL     Status: Abnormal   Collection Time: 11/08/21  5:17 PM  Result Value Ref Range   WBC 10.8 (H) 4.0 - 10.5 K/uL   RBC 4.32 4.22 - 5.81 MIL/uL   Hemoglobin 13.6 13.0 - 17.0 g/dL   HCT 41.6 39.0 - 52.0 %   MCV 96.3 80.0 - 100.0 fL   MCH 31.5 26.0 - 34.0 pg   MCHC 32.7 30.0 - 36.0 g/dL   RDW 14.5 11.5 - 15.5 %   Platelets 391 150 - 400 K/uL   nRBC 0.0 0.0 - 0.2 %   Neutrophils Relative % 68 %   Neutro Abs 7.5 1.7 - 7.7 K/uL   Lymphocytes Relative 19 %   Lymphs Abs 2.1 0.7 - 4.0 K/uL   Monocytes Relative 8 %   Monocytes Absolute 0.9 0.1 - 1.0 K/uL   Eosinophils Relative 3 %   Eosinophils Absolute 0.3 0.0 - 0.5 K/uL   Basophils Relative 1 %   Basophils Absolute 0.1 0.0 - 0.1 K/uL   Immature Granulocytes 1 %   Abs Immature Granulocytes 0.06 0.00 - 0.07 K/uL    Comment: Performed at [facility], [place] [address], [place], [place] [address]  Magnesium     Status: None   Collection Time: 11/08/21  5:17 PM  Result Value Ref Range   Magnesium 1.9 1.7 - 2.4 mg/dL    Comment: Performed at [facility], [place] [address], [place],  [place] [address]  Protime-INR     Status: None   Collection Time: 11/08/21  5:17 PM  Result Value Ref Range   Prothrombin Time 13.0 11.4 - 15.2 seconds   INR 1.0 0.8 - 1.2    Comment: (NOTE) INR goal varies based on  device and disease states. Performed at [facility], [place] [address], [place], [place] [address]   Reticulocytes     Status: Abnormal   Collection Time: 11/08/21  5:17 PM  Result Value Ref Range   Retic Ct Pct 1.8 0.4 - 3.1 %   RBC. 4.30 4.22 - 5.81 MIL/uL   Retic Count, Absolute 76.1 19.0 - 186.0 K/uL   Immature Retic Fract 20.9 (H) 2.3 - 15.9 %    Comment: Performed at [facility], [place] [address], [place], [place] [address]  Type and screen [place]     Status: None   Collection Time: 11/08/21  5:17 PM  Result Value Ref Range   ABO/RH(D) O NEG    Antibody Screen NEG    Sample Expiration      11/11/2021,2359 Performed at [facility], [place] [address], [place], [place] [address]   Resp Panel by RT-PCR (Flu A&B, Covid) Nasopharyngeal Swab     Status: None   Collection Time: 11/08/21  5:20 PM   Specimen: Nasopharyngeal Swab; Nasopharyngeal(NP) swabs in vial transport medium  Result Value Ref Range   SARS Coronavirus 2 by RT PCR NEGATIVE NEGATIVE    Comment: (NOTE) SARS-CoV-2 target nucleic acids are NOT DETECTED.  The SARS-CoV-2 RNA is generally detectable in upper respiratory specimens during the acute phase of infection. The lowest concentration of SARS-CoV-2 viral copies this assay can detect is 138 copies/mL. A negative result does not preclude SARS-Cov-2 infection and should not be used as the sole basis for treatment or other patient management decisions. A negative result may occur with  improper specimen collection/handling, submission of specimen other than nasopharyngeal swab, presence of viral mutation(s) within the areas targeted by this assay, and inadequate number of  viral copies(<138 copies/mL). A negative result must be combined with clinical observations, patient history, and epidemiological information. The expected result is Negative.  Fact Sheet for Patients:  EntrepreneurPulse.com.au  Fact Sheet for Healthcare Providers:  IncredibleEmployment.be  This test is no t yet approved or cleared by the [place] FDA and  has been authorized for detection and/or diagnosis of SARS-CoV-2 by FDA under an Emergency Use Authorization (EUA). This EUA will remain  in effect (meaning this test can be used) for the duration of the COVID-19 declaration under Section 564(b)(1) of the Act, 21 U.S.C.section 360bbb-3(b)(1), unless the authorization is terminated  or revoked sooner.       Influenza A by PCR NEGATIVE NEGATIVE   Influenza B by PCR NEGATIVE NEGATIVE    Comment: (NOTE) The Xpert Xpress SARS-CoV-2/FLU/RSV plus assay is intended as an aid in the diagnosis of influenza from Nasopharyngeal swab specimens and should not be used as a sole basis for treatment. Nasal washings and aspirates are unacceptable for Xpert Xpress SARS-CoV-2/FLU/RSV testing.  Fact Sheet for Patients: EntrepreneurPulse.com.au  Fact Sheet for Healthcare Providers: IncredibleEmployment.be  This test is not yet approved or cleared by the [place] FDA and has been authorized for detection and/or diagnosis of SARS-CoV-2 by FDA under an Emergency Use Authorization (EUA). This EUA will remain in effect (meaning this test can be used) for the duration of the COVID-19 declaration under Section 564(b)(1) of the Act, 21 U.S.C. section 360bbb-3(b)(1), unless the authorization is terminated or revoked.  Performed at [facility], [place] [address] [place], [place] [address]   Glucose, capillary     Status: None   Collection Time: 11/08/21  6:34 PM  Result Value  Ref Range    Glucose-Capillary 89 70 - 99 mg/dL    Comment: Glucose reference range applies only to samples taken after fasting for at least 8 hours.  Glucose, capillary     Status: None   Collection Time: 11/08/21 10:50 PM  Result Value Ref Range   Glucose-Capillary 96 70 - 99 mg/dL    Comment: Glucose reference range applies only to samples taken after fasting for at least 8 hours.  Comprehensive metabolic panel     Status: Abnormal   Collection Time: 11/09/21  5:59 AM  Result Value Ref Range   Sodium 137 135 - 145 mmol/L   Potassium 4.6 3.5 - 5.1 mmol/L   Chloride 104 98 - 111 mmol/L   CO2 28 22 - 32 mmol/L   Glucose, Bld 105 (H) 70 - 99 mg/dL    Comment: Glucose reference range applies only to samples taken after fasting for at least 8 hours.   BUN 15 8 - 23 mg/dL   Creatinine, Ser 1.01 0.61 - 1.24 mg/dL   Calcium 8.6 (L) 8.9 - 10.3 mg/dL   Total Protein 6.2 (L) 6.5 - 8.1 g/dL   Albumin 3.3 (L) 3.5 - 5.0 g/dL   AST 18 15 - 41 U/L   ALT 24 0 - 44 U/L   Alkaline Phosphatase 60 38 - 126 U/L   Total Bilirubin 0.7 0.3 - 1.2 mg/dL   GFR, Estimated >60 >60 mL/min    Comment: (NOTE) Calculated using the CKD-EPI Creatinine Equation (2021)    Anion gap 5 5 - 15    Comment: Performed at [facility], [place] [address], [place], [place] [address]  CBC     Status: Abnormal   Collection Time: 11/09/21  5:59 AM  Result Value Ref Range   WBC 10.6 (H) 4.0 - 10.5 K/uL   RBC 4.30 4.22 - 5.81 MIL/uL   Hemoglobin 13.6 13.0 - 17.0 g/dL   HCT 41.1 39.0 - 52.0 %   MCV 95.6 80.0 - 100.0 fL   MCH 31.6 26.0 - 34.0 pg   MCHC 33.1 30.0 - 36.0 g/dL   RDW 14.5 11.5 - 15.5 %   Platelets 388 150 - 400 K/uL   nRBC 0.0 0.0 - 0.2 %    Comment: Performed at [facility], [place] [address], [place], [place] [address]  Glucose, capillary     Status: Abnormal   Collection Time: 11/09/21  8:25 AM  Result Value Ref Range   Glucose-Capillary 117 (H) 70 - 99 mg/dL    Comment: Glucose  reference range applies only to samples taken after fasting for at least 8 hours.    DG Abd 2 Views  Result Date: 11/08/2021 CLINICAL DATA:  Left lower quadrant pain and constipation. EXAM: ABDOMEN - 2 VIEW COMPARISON:  CT abdomen and pelvis 11/04/2021. FINDINGS: The bowel gas pattern is normal. There is no evidence of free air. Colonic diverticula are again seen. There is a large amount of stool in the proximal colon. There are phleboliths in the right hemipelvis. Right hip arthroplasty is present. No acute fractures are seen. IMPRESSION: 1. Nonobstructive bowel gas pattern. 2. Colonic diverticulosis. Electronically Signed   By: Ronney Asters M.D.   On: 11/08/2021 18:09    Review of Systems  All other systems reviewed and are negative. Blood pressure 123/74, pulse 85, temperature 98.5 F (36.9 C), temperature source Oral, resp. rate 20, SpO2 94 %. Physical Exam Constitutional:      General:  He is not in acute distress. Genitourinary:    Comments: On rectal exam, the perianal area is tender.  Because of discomfort I cannot see the external hemorrhoids.  There was no active bleeding.  Because of his pain, I deferred an internal rectal exam. Neurological:     Mental Status: He is alert.    Assessment/Plan: Bleeding internal hemorrhoids with thrombosed external hemorrhoids  This was discussed with one of our colorectal surgeons Dr. Dema Severin yesterday.  He recommended continued observation and no resumption of anticoagulation medications at this point.  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  Currently, he does not seem to be actively bleeding.  We will keep him on clear liquids today and tomorrow to see bleeding resumes.  If it does, we will have one of our colorectal surgeons see him on Monday to determine whether or not hemorrhoidectomy is necessary at this point versus restarting his Xarelto.   Should gross bleeding recur over the weekend, we would have to consider an urgent  exam under anesthesia and hemorrhoidectomy at this point.Coralie Keens 11/09/2021, 9:15 AM

## 2021-11-09 NOTE — Progress Notes (Signed)
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  He was hospitalized 1/20 3-10/2022 for acute lower GI bleed, GI was consulted and underwent flexible sigmoidoscopy and was found to have a thrombosed internal and external hemorrhoids.  After discharge she continued to have blood in the stool as well as worsening abdominal pain, also few episodes of lightheadedness along with diaphoresis.  He also noted a change in voice and his tongue being coated with white, was diagnosed with thrush and started on fluconazole.  Subjective / 24h Interval events: Continues to complain of rectal pain as well as left lower quadrant abdominal pain.  Has ongoing blood in his stools  Assessment & Plan: Principal problem Acute lower GI bleed, BRBPR -General surgery consulted and following.  GI consulted during his prior hospitalization, underwent flexible sigmoidoscopy which showed evidence of thrombosed internal and external hemorrhoids.  Continue pain control.  General surgery consulted, continue to monitor.  Keep him on clear liquids, will be seen by colorectal surgeon on Monday to determine whether or not hemorrhoidectomy is necessary.  If gross bleeding recurs will need to have urgent exam under anesthesia and hemorrhoidectomy  Active problems History of PE/DVT in August 2022-currently anticoagulation is on hold.  Lower extremity Doppler November 2022 was negative for DVT.  Last dose of Xarelto was on Monday 1/23  DM2-A1c was 5.  Keep on sliding scale  CBG (last 3)  Recent Labs    11/08/21 1834  11/08/21 2250 11/09/21 0825  GLUCAP 89 96 117*    Hyperlipidemia-continue statin  Chronic pain syndrome-continue home medications  Oropharyngeal candidiasis-continue fluconazole  BPH-continue finasteride   Scheduled Meds:  finasteride  5 mg Oral Daily   fluconazole  200 mg Oral Daily   gabapentin  300 mg Oral Daily   And   gabapentin  600 mg Oral QHS   hydrocortisone  25 mg Rectal BID   insulin aspart  0-5 Units Subcutaneous QHS   insulin aspart  0-9 Units Subcutaneous TID WC   lidocaine  2 patch Transdermal Daily   magic mouthwash  15 mL Oral QID   methocarbamol  500 mg Oral TID   pantoprazole  40 mg Oral Daily   polyethylene glycol  17 g Oral Daily   pravastatin  40 mg Oral Daily   tadalafil  5 mg Oral Daily   tamsulosin  0.4 mg Oral BID   topiramate  25 mg Oral QHS   zolpidem  10 mg Oral QHS   Continuous Infusions:  lactated ringers 10 mL/hr at 11/09/21 0402   PRN Meds:.acetaminophen **OR** acetaminophen, baclofen, cyclobenzaprine, diazepam, dicyclomine, fluticasone, HYDROmorphone (DILAUDID) injection, ipratropium-albuterol, lidocaine, lip balm, loratadine, ondansetron **OR** ondansetron (ZOFRAN) IV, oxyCODONE, oxymetazoline, SUMAtriptan  Diet Orders (From admission, onward)     Start     Ordered   11/08/21 1637  Diet clear liquid Room service appropriate? Yes; Fluid consistency: Thin  Diet effective now       Question Answer Comment  Room service appropriate? Yes   Fluid consistency: Thin      11/08/21 1639  DVT prophylaxis: SCDs Start: 11/08/21 1636   Lab Results  Component Value Date   PLT 388 11/09/2021      Code Status: Full Code  Family Communication: s/o @ bedside  Status is: Observation  The patient will require care spanning > 2 midnights and should be moved to inpatient because: High risk for recurrent bleeding  Level of care: Telemetry  Consultants:  General surgery  Procedures:  None  Microbiology   None  Antimicrobials: Diflucan   Objective: Vitals:   11/08/21 1528 11/08/21 1930 11/08/21 2244 11/09/21 0347  BP: (!) 178/96 (!) 146/72 (!) 155/88 123/74  Pulse: 98 71 100 85  Resp:  20 18 20   Temp: 98 F (36.7 C) 97.7 F (36.5 C) 98.2 F (36.8 C) 98.5 F (36.9 C)  TempSrc: Oral Oral Oral Oral  SpO2: 97% 99% 98% 94%    Intake/Output Summary (Last 24 hours) at 11/09/2021 1008 Last data filed at 11/09/2021 6712 Gross per 24 hour  Intake 832.5 ml  Output --  Net 832.5 ml   Wt Readings from Last 3 Encounters:  11/05/21 (!) 140.6 kg  11/04/21 (!) 144.9 kg  06/03/21 135.7 kg    Examination:  Constitutional: NAD Respiratory: clear to auscultation bilaterally, no wheezing, no crackles.  Cardiovascular: Regular rate and rhythm, no murmurs / rubs / gallops. Trace LE edema.  Abdomen: non distended, Bowel sounds positive.    Data Reviewed: I have independently reviewed following labs and imaging studies   CBC Recent Labs  Lab 11/04/21 1731 11/05/21 0425 11/08/21 1717 11/09/21 0559  WBC 13.2* 11.4* 10.8* 10.6*  HGB 14.1 12.9* 13.6 13.6  HCT 42.7 39.8 41.6 41.1  PLT 451* 394 391 388  MCV 96.2 96.6 96.3 95.6  MCH 31.8 31.3 31.5 31.6  MCHC 33.0 32.4 32.7 33.1  RDW 14.9 15.0 14.5 14.5  LYMPHSABS  --   --  2.1  --   MONOABS  --   --  0.9  --   EOSABS  --   --  0.3  --   BASOSABS  --   --  0.1  --     Recent Labs  Lab 11/04/21 1731 11/05/21 0425 11/08/21 1717 11/09/21 0559  NA 139  --  133* 137  K 4.3  --  4.1 4.6  CL 108  --  101 104  CO2 24  --  25 28  GLUCOSE 112*  --  113* 105*  BUN 20  --  16 15  CREATININE 0.97  --  0.96 1.01  CALCIUM 8.7*  --  8.6* 8.6*  AST 21  --  22 18  ALT 30  --  28 24  ALKPHOS 72  --  64 60  BILITOT 0.7  --  0.5 0.7  ALBUMIN 3.9  --  3.7 3.3*  MG 2.4  --  1.9  --   INR 1.0  --  1.0  --   HGBA1C  --  5.7*  --   --      ------------------------------------------------------------------------------------------------------------------ No results for input(s): CHOL, HDL, LDLCALC, TRIG, CHOLHDL, LDLDIRECT in the last 72 hours.  Lab Results  Component Value Date   HGBA1C 5.7 (H) 11/05/2021   ------------------------------------------------------------------------------------------------------------------ No results for input(s): TSH, T4TOTAL, T3FREE, THYROIDAB in the last 72 hours.  Invalid input(s): FREET3  Cardiac Enzymes No results for input(s): CKMB, TROPONINI, MYOGLOBIN in the last 168 hours.  Invalid input(s): CK ------------------------------------------------------------------------------------------------------------------ No results found for: BNP  CBG: Recent Labs  Lab 11/05/21  1404 11/05/21 1627 11/08/21 1834 11/08/21 2250 11/09/21 0825  GLUCAP 96 113* 89 96 117*    Recent Results (from the past 240 hour(s))  Resp Panel by RT-PCR (Flu A&B, Covid) Nasopharyngeal Swab     Status: None   Collection Time: 11/04/21  6:07 PM   Specimen: Nasopharyngeal Swab; Nasopharyngeal(NP) swabs in vial transport medium  Result Value Ref Range Status   SARS Coronavirus 2 by RT PCR NEGATIVE NEGATIVE Final    Comment: (NOTE) SARS-CoV-2 target nucleic acids are NOT DETECTED.  The SARS-CoV-2 RNA is generally detectable in upper respiratory specimens during the acute phase of infection. The lowest concentration of SARS-CoV-2 viral copies this assay can detect is 138 copies/mL. A negative result does not preclude SARS-Cov-2 infection and should not be used as the sole basis for treatment or other patient management decisions. A negative result may occur with  improper specimen collection/handling, submission of specimen other than nasopharyngeal swab, presence of viral mutation(s) within the areas targeted by this assay, and inadequate number of viral copies(<138 copies/mL). A negative result must  be combined with clinical observations, patient history, and epidemiological information. The expected result is Negative.  Fact Sheet for Patients:  EntrepreneurPulse.com.au  Fact Sheet for Healthcare Providers:  IncredibleEmployment.be  This test is no t yet approved or cleared by the [place] FDA and  has been authorized for detection and/or diagnosis of SARS-CoV-2 by FDA under an Emergency Use Authorization (EUA). This EUA will remain  in effect (meaning this test can be used) for the duration of the COVID-19 declaration under Section 564(b)(1) of the Act, 21 U.S.C.section 360bbb-3(b)(1), unless the authorization is terminated  or revoked sooner.       Influenza A by PCR NEGATIVE NEGATIVE Final   Influenza B by PCR NEGATIVE NEGATIVE Final    Comment: (NOTE) The Xpert Xpress SARS-CoV-2/FLU/RSV plus assay is intended as an aid in the diagnosis of influenza from Nasopharyngeal swab specimens and should not be used as a sole basis for treatment. Nasal washings and aspirates are unacceptable for Xpert Xpress SARS-CoV-2/FLU/RSV testing.  Fact Sheet for Patients: EntrepreneurPulse.com.au  Fact Sheet for Healthcare Providers: IncredibleEmployment.be  This test is not yet approved or cleared by the [place] FDA and has been authorized for detection and/or diagnosis of SARS-CoV-2 by FDA under an Emergency Use Authorization (EUA). This EUA will remain in effect (meaning this test can be used) for the duration of the COVID-19 declaration under Section 564(b)(1) of the Act, 21 U.S.C. section 360bbb-3(b)(1), unless the authorization is terminated or revoked.  Performed at [facility], [place] [address] [place], [place] [address]   Resp Panel by RT-PCR (Flu A&B, Covid) Nasopharyngeal Swab     Status: None   Collection Time: 11/08/21  5:20 PM   Specimen: Nasopharyngeal Swab;  Nasopharyngeal(NP) swabs in vial transport medium  Result Value Ref Range Status   SARS Coronavirus 2 by RT PCR NEGATIVE NEGATIVE Final    Comment: (NOTE) SARS-CoV-2 target nucleic acids are NOT DETECTED.  The SARS-CoV-2 RNA is generally detectable in upper respiratory specimens during the acute phase of infection. The lowest concentration of SARS-CoV-2 viral copies this assay can detect is 138 copies/mL. A negative result does not preclude SARS-Cov-2 infection and should not be used as the sole basis for treatment or other patient management decisions. A negative result may occur with  improper specimen collection/handling, submission of specimen other than nasopharyngeal swab, presence of viral mutation(s) within the areas targeted by this assay,  and inadequate number of viral copies(<138 copies/mL). A negative result must be combined with clinical observations, patient history, and epidemiological information. The expected result is Negative.  Fact Sheet for Patients:  EntrepreneurPulse.com.au  Fact Sheet for Healthcare Providers:  IncredibleEmployment.be  This test is no t yet approved or cleared by the [place] FDA and  has been authorized for detection and/or diagnosis of SARS-CoV-2 by FDA under an Emergency Use Authorization (EUA). This EUA will remain  in effect (meaning this test can be used) for the duration of the COVID-19 declaration under Section 564(b)(1) of the Act, 21 U.S.C.section 360bbb-3(b)(1), unless the authorization is terminated  or revoked sooner.       Influenza A by PCR NEGATIVE NEGATIVE Final   Influenza B by PCR NEGATIVE NEGATIVE Final    Comment: (NOTE) The Xpert Xpress SARS-CoV-2/FLU/RSV plus assay is intended as an aid in the diagnosis of influenza from Nasopharyngeal swab specimens and should not be used as a sole basis for treatment. Nasal washings and aspirates are unacceptable for Xpert Xpress  SARS-CoV-2/FLU/RSV testing.  Fact Sheet for Patients: EntrepreneurPulse.com.au  Fact Sheet for Healthcare Providers: IncredibleEmployment.be  This test is not yet approved or cleared by the [place] FDA and has been authorized for detection and/or diagnosis of SARS-CoV-2 by FDA under an Emergency Use Authorization (EUA). This EUA will remain in effect (meaning this test can be used) for the duration of the COVID-19 declaration under Section 564(b)(1) of the Act, 21 U.S.C. section 360bbb-3(b)(1), unless the authorization is terminated or revoked.  Performed at [facility], [place] [address], [place], [place] [address]      Radiology Studies: DG Abd 2 Views  Result Date: 11/08/2021 CLINICAL DATA:  Left lower quadrant pain and constipation. EXAM: ABDOMEN - 2 VIEW COMPARISON:  CT abdomen and pelvis 11/04/2021. FINDINGS: The bowel gas pattern is normal. There is no evidence of free air. Colonic diverticula are again seen. There is a large amount of stool in the proximal colon. There are phleboliths in the right hemipelvis. Right hip arthroplasty is present. No acute fractures are seen. IMPRESSION: 1. Nonobstructive bowel gas pattern. 2. Colonic diverticulosis. Electronically Signed   By: Ronney Asters M.D.   On: 11/08/2021 18:09     Marzetta Board, MD, PhD [facility]  Between 7 am - 7 pm I am available, please contact me via Amion (for emergencies) or Securechat (non urgent messages)  Between 7 pm - 7 am I am not available, please contact night coverage MD/APP via Amion

## 2021-11-10 DIAGNOSIS — Z86718 Personal history of other venous thrombosis and embolism: Secondary | ICD-10-CM | POA: Diagnosis not present

## 2021-11-10 DIAGNOSIS — K644 Residual hemorrhoidal skin tags: Secondary | ICD-10-CM | POA: Diagnosis present

## 2021-11-10 DIAGNOSIS — E785 Hyperlipidemia, unspecified: Secondary | ICD-10-CM | POA: Diagnosis present

## 2021-11-10 DIAGNOSIS — Z20822 Contact with and (suspected) exposure to covid-19: Secondary | ICD-10-CM | POA: Diagnosis present

## 2021-11-10 DIAGNOSIS — B3789 Other sites of candidiasis: Secondary | ICD-10-CM | POA: Diagnosis present

## 2021-11-10 DIAGNOSIS — K648 Other hemorrhoids: Secondary | ICD-10-CM | POA: Diagnosis present

## 2021-11-10 DIAGNOSIS — Z7984 Long term (current) use of oral hypoglycemic drugs: Secondary | ICD-10-CM | POA: Diagnosis not present

## 2021-11-10 DIAGNOSIS — N400 Enlarged prostate without lower urinary tract symptoms: Secondary | ICD-10-CM | POA: Diagnosis present

## 2021-11-10 DIAGNOSIS — G894 Chronic pain syndrome: Secondary | ICD-10-CM | POA: Diagnosis present

## 2021-11-10 DIAGNOSIS — M62838 Other muscle spasm: Secondary | ICD-10-CM | POA: Diagnosis present

## 2021-11-10 DIAGNOSIS — Z7901 Long term (current) use of anticoagulants: Secondary | ICD-10-CM | POA: Diagnosis not present

## 2021-11-10 DIAGNOSIS — Z8249 Family history of ischemic heart disease and other diseases of the circulatory system: Secondary | ICD-10-CM | POA: Diagnosis not present

## 2021-11-10 DIAGNOSIS — E1169 Type 2 diabetes mellitus with other specified complication: Secondary | ICD-10-CM | POA: Diagnosis present

## 2021-11-10 DIAGNOSIS — Z96641 Presence of right artificial hip joint: Secondary | ICD-10-CM | POA: Diagnosis present

## 2021-11-10 DIAGNOSIS — K625 Hemorrhage of anus and rectum: Secondary | ICD-10-CM | POA: Diagnosis present

## 2021-11-10 DIAGNOSIS — Z6841 Body Mass Index (BMI) 40.0 and over, adult: Secondary | ICD-10-CM | POA: Diagnosis not present

## 2021-11-10 DIAGNOSIS — Z96652 Presence of left artificial knee joint: Secondary | ICD-10-CM | POA: Diagnosis present

## 2021-11-10 DIAGNOSIS — Z79899 Other long term (current) drug therapy: Secondary | ICD-10-CM | POA: Diagnosis not present

## 2021-11-10 DIAGNOSIS — D6862 Lupus anticoagulant syndrome: Secondary | ICD-10-CM | POA: Diagnosis present

## 2021-11-10 DIAGNOSIS — K922 Gastrointestinal hemorrhage, unspecified: Secondary | ICD-10-CM | POA: Diagnosis not present

## 2021-11-10 DIAGNOSIS — Z86711 Personal history of pulmonary embolism: Secondary | ICD-10-CM | POA: Diagnosis not present

## 2021-11-10 DIAGNOSIS — B370 Candidal stomatitis: Secondary | ICD-10-CM | POA: Diagnosis present

## 2021-11-10 LAB — CBC
HCT: 45.2 % (ref 39.0–52.0)
Hemoglobin: 14.7 g/dL (ref 13.0–17.0)
MCH: 31.1 pg (ref 26.0–34.0)
MCHC: 32.5 g/dL (ref 30.0–36.0)
MCV: 95.8 fL (ref 80.0–100.0)
Platelets: 416 10*3/uL — ABNORMAL HIGH (ref 150–400)
RBC: 4.72 MIL/uL (ref 4.22–5.81)
RDW: 14.6 % (ref 11.5–15.5)
WBC: 10.2 10*3/uL (ref 4.0–10.5)
nRBC: 0 % (ref 0.0–0.2)

## 2021-11-10 LAB — COMPREHENSIVE METABOLIC PANEL
ALT: 24 U/L (ref 0–44)
AST: 17 U/L (ref 15–41)
Albumin: 3.6 g/dL (ref 3.5–5.0)
Alkaline Phosphatase: 68 U/L (ref 38–126)
Anion gap: 6 (ref 5–15)
BUN: 14 mg/dL (ref 8–23)
CO2: 25 mmol/L (ref 22–32)
Calcium: 8.8 mg/dL — ABNORMAL LOW (ref 8.9–10.3)
Chloride: 105 mmol/L (ref 98–111)
Creatinine, Ser: 0.93 mg/dL (ref 0.61–1.24)
GFR, Estimated: >60 mL/min (ref >60)
Glucose, Bld: 119 mg/dL — ABNORMAL HIGH (ref 70–99)
Potassium: 4.3 mmol/L (ref 3.5–5.1)
Sodium: 136 mmol/L (ref 135–145)
Total Bilirubin: 0.7 mg/dL (ref 0.3–1.2)
Total Protein: 6.8 g/dL (ref 6.5–8.1)

## 2021-11-10 LAB — MAGNESIUM: Magnesium: 2.2 mg/dL (ref 1.7–2.4)

## 2021-11-10 MED ORDER — SENNA 8.6 MG PO TABS
1.0000 | ORAL_TABLET | Freq: Every day | ORAL | Status: DC | PRN
  Administered 2021-11-10: 8.6 mg via ORAL
  Filled 2021-11-10: qty 2

## 2021-11-10 NOTE — Progress Notes (Signed)
° °  Subjective/Chief Complaint: Passed flatus and had a BM this morning and then saw more bright blood per rectum. Pain slightly less   Objective: Vital signs in last 24 hours: Temp:  [97.7 F (36.5 C)-98.3 F (36.8 C)] 97.8 F (36.6 C) (01/29 0523) Pulse Rate:  [73-87] 73 (01/29 0523) Resp:  [14-18] 14 (01/29 0523) BP: (131-156)/(77-98) 131/77 (01/29 0523) SpO2:  [98 %-99 %] 99 % (01/29 0523) Last BM Date: 11/08/21  Intake/Output from previous day: 01/28 0701 - 01/29 0700 In: 360 [P.O.:360] Out: -  Intake/Output this shift: No intake/output data recorded.  Exam: Awake and alert On rectal exam I was able to do a digital exam with some discomfort but less than yesterday.  Palpable external hemorrhoid is small.  No bright blood currently.  I could not feel a large internal hemorrhoid.  Lab Results:  Recent Labs    11/09/21 0559 11/10/21 0543  WBC 10.6* 10.2  HGB 13.6 14.7  HCT 41.1 45.2  PLT 388 416*   BMET Recent Labs    11/09/21 0559 11/10/21 0543  NA 137 136  K 4.6 4.3  CL 104 105  CO2 28 25  GLUCOSE 105* 119*  BUN 15 14  CREATININE 1.01 0.93  CALCIUM 8.6* 8.8*   PT/INR Recent Labs    11/08/21 1717  LABPROT 13.0  INR 1.0   ABG No results for input(s): PHART, HCO3 in the last 72 hours.  Invalid input(s): PCO2, PO2  Studies/Results: DG Abd 2 Views  Result Date: 11/08/2021 CLINICAL DATA:  Left lower quadrant pain and constipation. EXAM: ABDOMEN - 2 VIEW COMPARISON:  CT abdomen and pelvis 11/04/2021. FINDINGS: The bowel gas pattern is normal. There is no evidence of free air. Colonic diverticula are again seen. There is a large amount of stool in the proximal colon. There are phleboliths in the right hemipelvis. Right hip arthroplasty is present. No acute fractures are seen. IMPRESSION: 1. Nonobstructive bowel gas pattern. 2. Colonic diverticulosis. Electronically Signed   By: Ronney Asters M.D.   On: 11/08/2021 18:09     Anti-infectives: Anti-infectives (From admission, onward)    Start     Dose/Rate Route Frequency Ordered Stop   11/09/21 1000  fluconazole (DIFLUCAN) tablet 200 mg        200 mg Oral Daily 11/08/21 1639 11/15/21 0959       Assessment/Plan: Bleeding internal and thrombosed external hemorrhoids.  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  He is still having some bleeding from the hemorrhoids.  Clinically, his exam is improved except for the bleeding.  It is difficult to tell whether this does represent the internal hemorrhoid or the small amount of macerated tissue seen on sigmoidoscopy.  Currently we will continue his current regimen with the steroids.  I will discuss the situation with our colorectal surgeon Dr. Dema Severin for his opinion as to whether he needs an examination under anesthesia tomorrow.  I will let him have a soft diet today and will make him n.p.o. at midnight.  This will also be discussed with our acute care surgeon for this week Dr. Thermon Leyland.  Straightforward decision making  Coralie Keens MD 11/10/2021

## 2021-11-10 NOTE — Progress Notes (Signed)
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  He was hospitalized 1/20 3-10/2022 for acute lower GI bleed, GI was consulted and underwent flexible sigmoidoscopy and was found to have a thrombosed internal and external hemorrhoids.  After discharge she continued to have blood in the stool as well as worsening abdominal pain, also few episodes of lightheadedness along with diaphoresis.  He also noted a change in voice and his tongue being coated with white, was diagnosed with thrush and started on fluconazole.  Subjective / 24h Interval events: Less abdominal pain but ongoing rectal pain.  Had more bleeding this morning  Assessment & Plan: Principal problem Acute lower GI bleed, BRBPR -General surgery consulted and following.  GI consulted during his prior hospitalization, underwent flexible sigmoidoscopy which showed evidence of thrombosed internal and external hemorrhoids.  Continue pain control.  General surgery consulted, continue to monitor.  Keep him on clear liquids, will be seen by colorectal surgeon tomorrow to determine whether or not hemorrhoidectomy is necessary.  If gross bleeding recurs will need to have urgent exam under anesthesia and hemorrhoidectomy.  N.p.o. past midnight tonight  Active problems History of PE/DVT in August 2022-currently anticoagulation is on hold.  Lower extremity Doppler November 2022 was negative for DVT.  Last dose of Xarelto was on Monday 1/23.  He is seeing Dr. 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   Scheduled Meds:  finasteride  5 mg Oral Daily   fluconazole  200 mg Oral Daily   gabapentin  300 mg Oral Daily   And   gabapentin  600 mg Oral QHS   hydrocortisone  25 mg Rectal BID   lidocaine  2 patch Transdermal Daily   magic mouthwash  15 mL Oral QID   methocarbamol  500 mg Oral TID   pantoprazole  40 mg Oral Daily   polyethylene glycol  17 g Oral Daily   pravastatin  40 mg Oral Daily   tadalafil  5 mg Oral Daily   tamsulosin  0.4 mg Oral BID   topiramate  25 mg Oral QHS   zolpidem  10 mg Oral QHS   Continuous Infusions:  lactated ringers 10 mL/hr at 11/09/21 0402   PRN Meds:.acetaminophen **OR** acetaminophen, baclofen, cyclobenzaprine, diazepam, dicyclomine, fluticasone, HYDROmorphone (DILAUDID) injection, ipratropium-albuterol, lidocaine, lip balm, loratadine, ondansetron **OR** ondansetron (ZOFRAN) IV, oxyCODONE, oxymetazoline, prochlorperazine, SUMAtriptan  Diet Orders (From admission, onward)     Start     Ordered   11/08/21 1637  Diet clear liquid Room service appropriate? Yes; Fluid consistency: Thin  Diet effective now       Question Answer Comment  Room service appropriate? Yes   Fluid consistency: Thin      11/08/21 1639            DVT prophylaxis: SCDs Start: 11/08/21 1636   Lab Results  Component Value Date   PLT 416 (H) 11/10/2021  Code Status: Full Code   Status is: Inpatient, persistent pain, bleeding  Level of care: Med-Surg  Consultants:  General surgery  Procedures:  None  Microbiology  None  Antimicrobials: Diflucan   Objective: Vitals:   11/09/21 0347 11/09/21 1604 11/09/21 2031 11/10/21 0523  BP: 123/74 (!) 156/98 140/84 131/77  Pulse: 85 76 87 73  Resp: 20 18 14 14   Temp: 98.5 F  (36.9 C) 98.3 F (36.8 C) 97.7 F (36.5 C) 97.8 F (36.6 C)  TempSrc: Oral Oral Oral Oral  SpO2: 94% 98% 99% 99%    Intake/Output Summary (Last 24 hours) at 11/10/2021 0949 Last data filed at 11/10/2021 0825 Gross per 24 hour  Intake 600 ml  Output --  Net 600 ml    Wt Readings from Last 3 Encounters:  11/05/21 (!) 140.6 kg  11/04/21 (!) 144.9 kg  06/03/21 135.7 kg    Examination:  Constitutional: No distress Respiratory: CTA, no wheezing Cardiovascular: RRR, trace edema   Data Reviewed: I have independently reviewed following labs and imaging studies   CBC Recent Labs  Lab 11/04/21 1731 11/05/21 0425 11/08/21 1717 11/09/21 0559 11/10/21 0543  WBC 13.2* 11.4* 10.8* 10.6* 10.2  HGB 14.1 12.9* 13.6 13.6 14.7  HCT 42.7 39.8 41.6 41.1 45.2  PLT 451* 394 391 388 416*  MCV 96.2 96.6 96.3 95.6 95.8  MCH 31.8 31.3 31.5 31.6 31.1  MCHC 33.0 32.4 32.7 33.1 32.5  RDW 14.9 15.0 14.5 14.5 14.6  LYMPHSABS  --   --  2.1  --   --   MONOABS  --   --  0.9  --   --   EOSABS  --   --  0.3  --   --   BASOSABS  --   --  0.1  --   --      Recent Labs  Lab 11/04/21 1731 11/05/21 0425 11/08/21 1717 11/09/21 0559 11/10/21 0543  NA 139  --  133* 137 136  K 4.3  --  4.1 4.6 4.3  CL 108  --  101 104 105  CO2 24  --  25 28 25   GLUCOSE 112*  --  113* 105* 119*  BUN 20  --  16 15 14   CREATININE 0.97  --  0.96 1.01 0.93  CALCIUM 8.7*  --  8.6* 8.6* 8.8*  AST 21  --  22 18 17   ALT 30  --  28 24 24   ALKPHOS 72  --  64 60 68  BILITOT 0.7  --  0.5 0.7 0.7  ALBUMIN 3.9  --  3.7 3.3* 3.6  MG 2.4  --  1.9  --  2.2  INR 1.0  --  1.0  --   --   HGBA1C  --  5.7*  --   --   --      ------------------------------------------------------------------------------------------------------------------ No results for input(s): CHOL, HDL, LDLCALC, TRIG, CHOLHDL, LDLDIRECT in the last 72 hours.  Lab Results  Component Value Date   HGBA1C 5.7 (H) 11/05/2021    ------------------------------------------------------------------------------------------------------------------ No results for input(s): TSH, T4TOTAL, T3FREE, THYROIDAB in the last 72 hours.  Invalid input(s): FREET3  Cardiac Enzymes No results for input(s): CKMB, TROPONINI, MYOGLOBIN in the last 168 hours.  Invalid input(s): CK ------------------------------------------------------------------------------------------------------------------ No results found for: BNP  CBG: Recent Labs  Lab 11/05/21 1627 11/08/21 1834 11/08/21 2250 11/09/21 0825 11/09/21 1135  GLUCAP 113* 89 96 117* 113*     Recent Results (from the past 240  hour(s))  Resp Panel by RT-PCR (Flu A&B, Covid) Nasopharyngeal Swab     Status: None   Collection Time: 11/04/21  6:07 PM   Specimen: Nasopharyngeal Swab; Nasopharyngeal(NP) swabs in vial transport medium  Result Value Ref Range Status   SARS Coronavirus 2 by RT PCR NEGATIVE NEGATIVE Final    Comment: (NOTE) SARS-CoV-2 target nucleic acids are NOT DETECTED.  The SARS-CoV-2 RNA is generally detectable in upper respiratory specimens during the acute phase of infection. The lowest concentration of SARS-CoV-2 viral copies this assay can detect is 138 copies/mL. A negative result does not preclude SARS-Cov-2 infection and should not be used as the sole basis for treatment or other patient management decisions. A negative result may occur with  improper specimen collection/handling, submission of specimen other than nasopharyngeal swab, presence of viral mutation(s) within the areas targeted by this assay, and inadequate number of viral copies(<138 copies/mL). A negative result must be combined with clinical observations, patient history, and epidemiological information. The expected result is Negative.  Fact Sheet for Patients:  EntrepreneurPulse.com.au  Fact Sheet for Healthcare Providers:   IncredibleEmployment.be  This test is no t yet approved or cleared by the [place] FDA and  has been authorized for detection and/or diagnosis of SARS-CoV-2 by FDA under an Emergency Use Authorization (EUA). This EUA will remain  in effect (meaning this test can be used) for the duration of the COVID-19 declaration under Section 564(b)(1) of the Act, 21 U.S.C.section 360bbb-3(b)(1), unless the authorization is terminated  or revoked sooner.       Influenza A by PCR NEGATIVE NEGATIVE Final   Influenza B by PCR NEGATIVE NEGATIVE Final    Comment: (NOTE) The Xpert Xpress SARS-CoV-2/FLU/RSV plus assay is intended as an aid in the diagnosis of influenza from Nasopharyngeal swab specimens and should not be used as a sole basis for treatment. Nasal washings and aspirates are unacceptable for Xpert Xpress SARS-CoV-2/FLU/RSV testing.  Fact Sheet for Patients: EntrepreneurPulse.com.au  Fact Sheet for Healthcare Providers: IncredibleEmployment.be  This test is not yet approved or cleared by the [place] FDA and has been authorized for detection and/or diagnosis of SARS-CoV-2 by FDA under an Emergency Use Authorization (EUA). This EUA will remain in effect (meaning this test can be used) for the duration of the COVID-19 declaration under Section 564(b)(1) of the Act, 21 U.S.C. section 360bbb-3(b)(1), unless the authorization is terminated or revoked.  Performed at [facility], [place] [address], [place], [place] [address]   Resp Panel by RT-PCR (Flu A&B, Covid) Nasopharyngeal Swab     Status: None   Collection Time: 11/08/21  5:20 PM   Specimen: Nasopharyngeal Swab; Nasopharyngeal(NP) swabs in vial transport medium  Result Value Ref Range Status   SARS Coronavirus 2 by RT PCR NEGATIVE NEGATIVE Final    Comment: (NOTE) SARS-CoV-2 target nucleic acids are NOT DETECTED.  The SARS-CoV-2 RNA is generally  detectable in upper respiratory specimens during the acute phase of infection. The lowest concentration of SARS-CoV-2 viral copies this assay can detect is 138 copies/mL. A negative result does not preclude SARS-Cov-2 infection and should not be used as the sole basis for treatment or other patient management decisions. A negative result may occur with  improper specimen collection/handling, submission of specimen other than nasopharyngeal swab, presence of viral mutation(s) within the areas targeted by this assay, and inadequate number of viral copies(<138 copies/mL). A negative result must be combined with clinical observations, patient history, and epidemiological information. The expected result is  Negative.  Fact Sheet for Patients:  EntrepreneurPulse.com.au  Fact Sheet for Healthcare Providers:  IncredibleEmployment.be  This test is no t yet approved or cleared by the [place] FDA and  has been authorized for detection and/or diagnosis of SARS-CoV-2 by FDA under an Emergency Use Authorization (EUA). This EUA will remain  in effect (meaning this test can be used) for the duration of the COVID-19 declaration under Section 564(b)(1) of the Act, 21 U.S.C.section 360bbb-3(b)(1), unless the authorization is terminated  or revoked sooner.       Influenza A by PCR NEGATIVE NEGATIVE Final   Influenza B by PCR NEGATIVE NEGATIVE Final    Comment: (NOTE) The Xpert Xpress SARS-CoV-2/FLU/RSV plus assay is intended as an aid in the diagnosis of influenza from Nasopharyngeal swab specimens and should not be used as a sole basis for treatment. Nasal washings and aspirates are unacceptable for Xpert Xpress SARS-CoV-2/FLU/RSV testing.  Fact Sheet for Patients: EntrepreneurPulse.com.au  Fact Sheet for Healthcare Providers: IncredibleEmployment.be  This test is not yet approved or cleared by the [place] FDA  and has been authorized for detection and/or diagnosis of SARS-CoV-2 by FDA under an Emergency Use Authorization (EUA). This EUA will remain in effect (meaning this test can be used) for the duration of the COVID-19 declaration under Section 564(b)(1) of the Act, 21 U.S.C. section 360bbb-3(b)(1), unless the authorization is terminated or revoked.  Performed at [facility], [place] [address], [place], [place] [address]       Radiology Studies: No results found.   Marzetta Board, MD, PhD [facility]  Between 7 am - 7 pm I am available, please contact me via Amion (for emergencies) or Securechat (non urgent messages)  Between 7 pm - 7 am I am not available, please contact night coverage MD/APP via Amion

## 2021-11-11 ENCOUNTER — Encounter (HOSPITAL_COMMUNITY)
Admission: AD | Disposition: A | Discharge status: Home / Self Care | Payer: Self-pay | Source: Ambulatory Visit | Attending: Internal Medicine

## 2021-11-11 ENCOUNTER — Other Ambulatory Visit (HOSPITAL_COMMUNITY): Payer: Self-pay

## 2021-11-11 ENCOUNTER — Telehealth: Payer: Self-pay | Admitting: *Deleted

## 2021-11-11 LAB — BASIC METABOLIC PANEL
Anion gap: 5 (ref 5–15)
BUN: 16 mg/dL (ref 8–23)
CO2: 28 mmol/L (ref 22–32)
Calcium: 8.6 mg/dL — ABNORMAL LOW (ref 8.9–10.3)
Chloride: 104 mmol/L (ref 98–111)
Creatinine, Ser: 1.02 mg/dL (ref 0.61–1.24)
GFR, Estimated: >60 mL/min (ref >60)
Glucose, Bld: 123 mg/dL — ABNORMAL HIGH (ref 70–99)
Potassium: 4.8 mmol/L (ref 3.5–5.1)
Sodium: 137 mmol/L (ref 135–145)

## 2021-11-11 LAB — CBC
HCT: 43.5 % (ref 39.0–52.0)
Hemoglobin: 14.2 g/dL (ref 13.0–17.0)
MCH: 31.4 pg (ref 26.0–34.0)
MCHC: 32.6 g/dL (ref 30.0–36.0)
MCV: 96.2 fL (ref 80.0–100.0)
Platelets: 379 10*3/uL (ref 150–400)
RBC: 4.52 MIL/uL (ref 4.22–5.81)
RDW: 14.7 % (ref 11.5–15.5)
WBC: 9.5 10*3/uL (ref 4.0–10.5)
nRBC: 0 % (ref 0.0–0.2)

## 2021-11-11 SURGERY — HEMORRHOIDECTOMY
Anesthesia: General

## 2021-11-11 MED ORDER — HYDROCORTISONE ACETATE 25 MG RE SUPP
25.0000 mg | Freq: Two times a day (BID) | RECTAL | 1 refills | Status: DC
  Filled 2021-11-11: qty 12, 6d supply, fill #0
  Filled 2021-12-12: qty 12, 6d supply, fill #1

## 2021-11-11 MED ORDER — OXYCODONE HCL 10 MG PO TABS
10.0000 mg | ORAL_TABLET | Freq: Three times a day (TID) | ORAL | 0 refills | Status: DC
Start: 2021-11-11 — End: 2023-08-10
  Filled 2021-11-11: qty 10, 4d supply, fill #0

## 2021-11-11 MED ORDER — MAGIC MOUTHWASH
15.0000 mL | Freq: Four times a day (QID) | ORAL | 0 refills | Status: DC
  Filled 2021-11-11 (×2): qty 20, 1d supply, fill #0

## 2021-11-11 MED ORDER — CEFAZOLIN IN SODIUM CHLORIDE 3-0.9 GM/100ML-% IV SOLN
3.0000 g | INTRAVENOUS | Status: DC
  Filled 2021-11-11: qty 100

## 2021-11-11 MED ORDER — NYSTATIN 100000 UNIT/ML MT SUSP
Freq: Four times a day (QID) | OROMUCOSAL | 0 refills | Status: DC
  Filled 2021-11-11: qty 320, 4d supply, fill #0

## 2021-11-11 MED ORDER — SENNA 8.6 MG PO TABS
1.0000 | ORAL_TABLET | Freq: Every day | ORAL | 0 refills | Status: AC | PRN
  Filled 2021-11-11: qty 120, 60d supply, fill #0

## 2021-11-11 SURGICAL SUPPLY — 36 items
BAG COUNTER SPONGE SURGICOUNT (BAG) IMPLANT
BLADE HEX COATED 2.75 (ELECTRODE) ×2 IMPLANT
BLADE SURG 15 STRL LF DISP TIS (BLADE) ×1 IMPLANT
BLADE SURG 15 STRL SS (BLADE) ×1
COVER SURGICAL LIGHT HANDLE (MISCELLANEOUS) ×2 IMPLANT
DECANTER SPIKE VIAL GLASS SM (MISCELLANEOUS) ×2 IMPLANT
DRAPE LAPAROTOMY T 102X78X121 (DRAPES) IMPLANT
ELECT REM PT RETURN 15FT ADLT (MISCELLANEOUS) ×2 IMPLANT
GAUZE 4X4 16PLY ~~LOC~~+RFID DBL (SPONGE) IMPLANT
GAUZE SPONGE 4X4 12PLY STRL (GAUZE/BANDAGES/DRESSINGS) IMPLANT
GLOVE SURG ENC MOIS LTX SZ6 (GLOVE) ×4 IMPLANT
GLOVE SURG MICRO LTX SZ6 (GLOVE) ×2 IMPLANT
GLOVE SURG UNDER LTX SZ6.5 (GLOVE) ×4 IMPLANT
GLOVE SURG UNDER POLY LF SZ6.5 (GLOVE) ×2 IMPLANT
GLOVE SURG UNDER POLY LF SZ7 (GLOVE) ×2 IMPLANT
GOWN STRL REUS W/ TWL XL LVL3 (GOWN DISPOSABLE) ×4 IMPLANT
GOWN STRL REUS W/TWL 2XL LVL3 (GOWN DISPOSABLE) ×2 IMPLANT
GOWN STRL REUS W/TWL LRG LVL3 (GOWN DISPOSABLE) ×2 IMPLANT
GOWN STRL REUS W/TWL XL LVL3 (GOWN DISPOSABLE) ×4
KIT BASIN OR (CUSTOM PROCEDURE TRAY) ×2 IMPLANT
KIT TURNOVER KIT A (KITS) IMPLANT
NEEDLE HYPO 22GX1.5 SAFETY (NEEDLE) ×2 IMPLANT
PACK BASIC VI WITH GOWN DISP (CUSTOM PROCEDURE TRAY) IMPLANT
PACK LITHOTOMY IV (CUSTOM PROCEDURE TRAY) ×2 IMPLANT
PANTS MESH DISP LRG (UNDERPADS AND DIAPERS) IMPLANT
PANTS MESH DISPOSABLE L (UNDERPADS AND DIAPERS)
PENCIL SMOKE EVACUATOR (MISCELLANEOUS) IMPLANT
SHEARS HARMONIC 9CM CVD (BLADE) IMPLANT
SPONGE SURGIFOAM ABS GEL 100 (HEMOSTASIS) ×2 IMPLANT
SPONGE T-LAP 18X18 ~~LOC~~+RFID (SPONGE) IMPLANT
SUT CHROMIC 3 0 SH 27 (SUTURE) IMPLANT
SUT VIC AB 3-0 SH 27 (SUTURE)
SUT VIC AB 3-0 SH 27XBRD (SUTURE) IMPLANT
SYR BULB IRRIG 60ML STRL (SYRINGE) IMPLANT
SYR CONTROL 10ML LL (SYRINGE) ×2 IMPLANT
TOWEL OR 17X26 10 PK STRL BLUE (TOWEL DISPOSABLE) ×6 IMPLANT

## 2021-11-11 NOTE — Progress Notes (Signed)
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  He has been maintained on Xarelto anticoagulation since August 2022.  A hypercoagulation panel was negative other than a positive "lupus anticoagulant ".  This was likely a false positive secondary to anticoagulation therapy.  He was referred to Dr. Joan Flores at [facility] to consider the indication for further evaluation and to recommend the length of anticoagulation therapy.  He canceled the appointment with Dr. Joan Flores secondary to a prostate biopsy.  He reports the prostate biopsy was negative for malignancy.  Dr. Sherral Moore noted the onset of rectal bleeding beginning 11/04/2021.  He was seen by gastroenterology and was admitted to the hospital.  He discontinued Xarelto 11/04/2021.  He was taken to a sigmoidoscopy with Dr. Ardis Hughs on 11/05/2021.  This revealed semithrombosed internal and external hemorrhoids with adherent clot.  Macerated tissue was noted at the distal rectum.  He was discharged to home 11/05/2021 with hydrocortisone suppositories, sitz bath's, Citrucel, and MiraLAX. He continued rectal bleeding following discharge from the hospital.  He contacted gastroenterology and was referred to the surgical service.  Hospital admission was again recommended.  He has pain in the left lower abdomen and rectum.  Patient has been evaluated by the surgical service.  He is scheduled to be seen by one of the coloanal surgeons.  Objective: Vital signs in last 24 hours: Blood pressure 125/77, pulse 78, temperature 98 F (36.7 C), temperature source Oral, resp. rate 14, SpO2 99 %.  Intake/Output from previous day: 01/29 0701 - 01/30 0700 In: 360 [P.O.:360] Out: -   Physical Exam:  Abdomen: Soft and nontender, no mass, no hepatosplenomegaly Extremities: The left lower leg is larger than the right side, no erythema or tenderness Lymph nodes: No cervical,  supraclavicular, axillary, or inguinal nodes   Lab Results: Recent Labs    11/10/21 0543 11/11/21 0515  WBC 10.2 9.5  HGB 14.7 14.2  HCT 45.2 43.5  PLT 416* 379    BMET Recent Labs    11/10/21 0543 11/11/21 0515  NA 136 137  K 4.3 4.8  CL 105 104  CO2 25 28  GLUCOSE 119* 123*  BUN 14 16  CREATININE 0.93 1.02  CALCIUM 8.8* 8.6*    No results found for: CEA1, CEA, CAN199, CA125  Studies/Results: No results found.  Medications: I have reviewed the patient's current medications.  Assessment/Plan: Left leg DVT, multiple bilateral pulmonary emboli 05/06/2021 Left leg Doppler 05/06/2021-acute to subacute occlusive DVT in the left peroneal veins of the calf, incompletely occlusive DVT in the popliteal vein and distal thigh femoral vein CT chest 05/06/2021-acute pulmonary embolism affecting multiple segmental branches bilaterally Xarelto anticoagulation starting 05/06/2021 Hypercoagulation panel 06/03/2021-positive "lupus anticoagulant "while maintained on anticoagulation therapy Left leg Doppler 08/28/2021-age-indeterminate DVT involving the left femoral, popliteal, and peroneal veins Diabetes Migraine headaches Family history of venous thromboembolic disease, brother with recurrent DVT/PE Chronic neck/lower back pain Status post multiple orthopedic procedures including a left knee replacement, chronic pain in the left knee BPH-patient reports prostate biopsy December 2022 negative for malignancy Hyperlipidemia Admission 11/04/2021 with rectal bleeding, Xarelto discontinued 11/04/2021 Sigmoidoscopy 11/05/2021-semithrombosed internal and external hemorrhoids with adherent clot, macerated tissue at the distal rectum   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  He is admitted with rectal bleeding for the past week.  Sigmoidoscopy confirmed bleeding hemorrhoids.  He has been maintained off of  anticoagulation therapy since 11/04/2021. He will see the  coloanal surgeons to discuss definitive management of the hemorrhoids.  I recommend holding Xarelto anticoagulation for now.  He can resume anticoagulation at a reduced dose when the hemorrhoids have been treated.  Recommendations: Management of hemorrhoids per the surgical service Hold Xarelto anticoagulation until he undergoes treatment of the hemorrhoids Resume Xarelto at a dose of 10 mg daily after hemorrhoids have been treating bleeding has resolved Reschedule appointment with Dr. Joan Flores at [facility] to discuss indication for continuing anticoagulation therapy   LOS: 1 day   Betsy Coder, MD   11/11/2021, 11:53 AM

## 2021-11-11 NOTE — Discharge Summary (Signed)
Physician Discharge Summary  Daniel Bossier, MD DSK:[id number] DOB: 1961/01/04 DOA: 11/08/2021  PCP: Donnajean Lopes, MD  Admit date: 11/08/2021  Discharge date: 11/11/2021  Admitted From: Home.  Disposition:  Home  Recommendations for Outpatient Follow-up:  Follow up with PCP in 1-2 weeks. Please obtain BMP/CBC in one week. Advised to follow-up with general surgery Dr. Leighton Ruff in 1 week. Advised to resume Xarelto in 2 to 3 days after discharge. Advised to take pain medications as needed. Advised sitz bath, high-fiber diet, stool softeners.  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  He was hospitalized  11/04/21-11/05/21 for acute lower GI bleed, GI was consulted and underwent flexible sigmoidoscopy. He was found to have a thrombosed internal and external hemorrhoids.  After discharge he continued to have blood in the stool as well as worsening abdominal pain, also few episodes of lightheadedness along with diaphoresis.  He also noted a change in voice and his tongue being coated with white, was diagnosed with thrush and started on fluconazole. He was admitted for acute lower GI bleeding / bright red blood per rectum.  General surgery was consulted.  Advised to continue pain control, continue clear liquid diet.  Patient will be seen by colorectal surgeon to determine whether or not hemorrhoidectomy is necessary.  Xarelto was kept on hold.  Patient reports hemorrhoidal bleeding has improved and slowed down.  Case was discussed with colorectal surgeon,   recommended patient can be discharged on pain medications,  steroid suppository and patient can follow-up with them in 1 week.  Patient can resume Xarelto in 2 to 3 days.  Patient feels better and want to be discharged.  He was managed for below problems.  Discharge Diagnoses:  Principal Problem:   Acute lower GI bleeding Active Problems:   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 without status migrainosus   BRBPR (bright red blood per rectum)   Controlled type 2 diabetes mellitus without complication, without long-term current use of insulin (HCC)   Hyperlipidemia   Chronic musculoskeletal pain   Oropharyngeal candidiasis   Hoarse voice quality   Near syncope   Rectal bleed  Acute lower GI bleed, BRBPR - General surgery consulted and following.  GI consulted during his prior hospitalization, underwent flexible sigmoidoscopy which showed evidence of thrombosed internal and external hemorrhoids.  Continue pain control.  General surgery consulted, continue to monitor.  Keep him on clear liquids, will be seen by colorectal surgeon to determine whether or not hemorrhoidectomy is necessary.  Case discussed with colorectal surgeon.  Recommended patient can be discharged on pain medications and steroid suppository patient will follow-up outpatient in 3 days.  Rectal bleeding has improved.   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  Lower extremity Doppler November 2022 was negative for DVT.  Last dose of Xarelto was on Monday 1/23.  He is seeing Dr. Benay Spice as an outpatient. Patient can resume Xarelto in 2 to 3 days after discharge.   DM2-A1c was 5, well controlled.   Hyperlipidemia-continue statin.   Chronic pain syndrome-continue home medications.  Oropharyngeal candidiasis-continue fluconazole.   BPH-continue finasteride.  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  Discharge Instructions  Discharge Instructions     Call MD for:  persistant dizziness or  light-headedness   Complete by: As directed    Call MD for:  persistant nausea and vomiting   Complete by: As directed    Call MD for:  severe uncontrolled pain   Complete by: As directed    Diet - low sodium heart healthy   Complete by: As directed    Diet Carb Modified   Complete by: As directed    Discharge instructions   Complete by: As directed    Advised to follow-up with primary care physician in 1 week. Advised to follow-up with general surgery Dr. Leighton Ruff in 1 week. Advised to resume Xarelto in 2 to 3 days after discharge. Advised to take pain medications as needed. Advised sitz bath, high-fiber diet, stool softeners.   Increase activity slowly   Complete by: As directed       Allergies as of 11/11/2021       Reactions   Benzoin    Other reaction(s): blisters   Benzoin Compound Other (See Comments)   Blisters         Medication List     TAKE these medications    Airborne Chew Chew 1 tablet by mouth 2 (two) times daily.   baclofen 10 MG tablet Commonly known as: LIORESAL Take 10-20 mg by mouth 2 (two) times daily as needed for muscle spasms.   Combivent Respimat 20-100 MCG/ACT Aers respimat Generic drug: Ipratropium-Albuterol Inhale 1 puff into the lungs every 6 (six) hours as needed. What changed: reasons to take this   cyclobenzaprine 10 MG tablet Commonly known as: FLEXERIL Take 1 tablet (10 mg total) by mouth 3 (three) times daily as needed. What changed:  how much to take when to take this reasons to take this   diazepam 5 MG tablet Commonly known as: VALIUM Take 5 mg by mouth daily as needed for muscle spasms.   dicyclomine 20 MG tablet Commonly known as: BENTYL TAKE 1 TABLET BY MOUTH EVERY 4 TO 6 HOURS AS NEEDED FOR CRAMPING. What changed:  how much to take how to take this when to take this additional instructions   finasteride 5 MG tablet Commonly known as: PROSCAR Take 1 tablet (5 mg total) by mouth daily.   Fioricet  50-300-40 MG Caps Generic drug: Butalbital-APAP-Caffeine Take 1 capsule by mouth daily as needed (head ache).   fluconazole 200 MG tablet Commonly known as: DIFLUCAN Take 1 tablet (200 mg total) by mouth daily for 7 days.   fluticasone 50 MCG/ACT nasal spray Commonly known as: FLONASE Place 2 sprays into both nostrils daily as needed for allergies.   gabapentin 600 MG tablet Commonly known as: NEURONTIN Take 1 tablet (600 mg total) by mouth 3 (three) times daily.   hydrocortisone 25 MG suppository Commonly known as: ANUSOL-HC Place 1 suppository (25 mg total) rectally 2 (two) times daily.   ketoconazole 2 % cream Commonly known as: NIZORAL APPLY TOPICALLY TO AFFECTED AREA TWICE DAILY. What changed:  when to take this reasons to take this   lidocaine 5 % Commonly known as: LIDODERM APPLY 2 PATCHES TOPICALLY TO THE AFFECTED AREAS DAILY (12 HOURS ON AND 12 HOURS OFF). What changed:  when to take this reasons to take this   AneCream 4 % cream Generic drug: lidocaine Apply topically 4 (four) times daily as needed (perianal pain). What changed: how much to take   loratadine  10 MG tablet Commonly known as: CLARITIN Take 10 mg by mouth daily as needed for allergies or rhinitis.   Metamucil 28.3 % Powd Generic drug: Psyllium Mix 3 teaspoonsful of powder into the appropriate amount of water and drink once a day. What changed:  how much to take how to take this when to take this additional instructions   metFORMIN 1000 MG tablet Commonly known as: GLUCOPHAGE Take 1 tablet (1,000 mg total) by mouth 2 (two) times daily.   methocarbamol 500 MG tablet Commonly known as: ROBAXIN Take 1 tablet (500 mg total) by mouth up to 3 (three) times daily as needed for tearful muscle spasms and pain. What changed:  when to take this reasons to take this   nystatin in diphenhydrAMINE liquid-alum & mag hydroxide-simeth suspension Swish and spit 40m by mouth 4 (four) times daily.    ondansetron 4 MG tablet Commonly known as: ZOFRAN Take 1 tablet (4 mg total) by mouth 3 (three) times daily. What changed:  when to take this reasons to take this   OVER THE COUNTER MEDICATION Take 1 capsule by mouth 2 (two) times daily. Super Beta Prostate Supplement capsules.   Oxycodone HCl 10 MG Tabs Take 1 tablet (10 mg total) by mouth every 8 (eight) hours. What changed:  medication strength how much to take when to take this reasons to take this   oxymetazoline 0.05 % nasal spray Commonly known as: AFRIN Place 2 sprays into both nostrils 2 (two) times daily as needed for congestion.   pantoprazole 40 MG tablet Commonly known as: PROTONIX Take 1 tablet (40 mg total) by mouth daily. What changed: when to take this   polyethylene glycol 17 g packet Commonly known as: MIRALAX / GLYCOLAX Take 17 g by mouth daily.   pravastatin 40 MG tablet Commonly known as: PRAVACHOL Take 1 tablet (40 mg total) by mouth daily. What changed: when to take this   rivaroxaban 20 MG Tabs tablet Commonly known as: Xarelto Take 1 tablet (20 mg total) by mouth daily with food.   rizatriptan 5 MG disintegrating tablet Commonly known as: MAXALT-MLT DISSOLVE ONE TABLET BY MOUTH AT ONSET OF HEADACHE MAY REPEAT IN 2 HOURS (MAX 6 TABLETS PER DAY) What changed:  how much to take how to take this when to take this additional instructions   senna 8.6 MG Tabs tablet Commonly known as: SENOKOT Take 1-2 tablets (8.6-17.2 mg total) by mouth daily as needed for mild constipation.   Spirometer Kit Use 3 times daily   tadalafil 5 MG tablet Commonly known as: CIALIS Take 1 tablet (5 mg total) by mouth daily for benign prostatic hypertrophy.   tamsulosin 0.4 MG Caps capsule Commonly known as: FLOMAX Take 1 capsule (0.4 mg total) by mouth 2 (two) times daily.   topiramate 25 MG tablet Commonly known as: TOPAMAX Take 1 tablet (25 mg total) by mouth at bedtime.( to prevent headaches) What  changed: when to take this   traMADol 50 MG tablet Commonly known as: ULTRAM Take 1 tablet (50 mg total) by mouth every 6 (six) hours as needed for pain. What changed:  reasons to take this Another medication with the same name was removed. Continue taking this medication, and follow the directions you see here.   zolpidem 10 MG tablet Commonly known as: AMBIEN Take 10 mg by mouth at bedtime.        Follow-up Information     PDonnajean Lopes MD Follow up in 1 week(s).  Specialty: Internal Medicine Contact information: [place] [address] [phone]         Leighton Ruff, MD. Schedule an appointment as soon as possible for a visit in 1 week(s).   Specialties: General Surgery, Colon and Rectal Surgery Why: and follow up as planned Contact information: [address] [place] [address] [phone]                Allergies  Allergen Reactions   Benzoin     Other reaction(s): blisters   Benzoin Compound Other (See Comments)    Blisters     Consultations: General Surgery  Procedures/Studies: CT ABDOMEN PELVIS W CONTRAST  Result Date: 11/04/2021 CLINICAL DATA:  Acute abdominal pain. EXAM: CT ABDOMEN AND PELVIS WITH CONTRAST TECHNIQUE: Multidetector CT imaging of the abdomen and pelvis was performed using the standard protocol following bolus administration of intravenous contrast. RADIATION DOSE REDUCTION: This exam was performed according to the departmental dose-optimization program which includes automated exposure control, adjustment of the mA and/or kV according to patient size and/or use of iterative reconstruction technique. CONTRAST:  154m OMNIPAQUE IOHEXOL 300 MG/ML  SOLN COMPARISON:  CT without contrast 05/30/2020, CT with contrast 04/01/2017 FINDINGS: Lower chest: No acute abnormality. Hepatobiliary: 21.5 cm length mildly steatotic and otherwise unremarkable liver. Gallbladder is distended to 10.5 cm in length  without wall thickening or calcified stone. No biliary dilatation. Pancreas: No mass or ductal dilatation , no adjacent inflammation/edema. Spleen: Normal in size and enhancement. Adrenals/Urinary Tract: There is no adrenal mass no focal abnormality in the renal cortex. There are few punctate nonobstructing caliceal stones in the inferior pole left kidney. No other urolithiasis is seen, no hydronephrosis or ureteral stone is evident with obscuration of right ureter below the level of the acetabulum due to total hip arthroplasty. The bladder is also posteriorly obscured by the right hip replacement but unremarkable where visible. Stomach/Bowel: No dilatation or wall thickening is seen including the appendix. Mild fecal stasis noted descending colon , fluid levels in the transverse segment with diffuse diverticula in the transverse and left colon without findings of acute diverticulitis. Vascular/Lymphatic: Normal abdominal aorta. Minimal calcification both common iliac arteries. No adenopathy is seen. Reproductive: Prostate partially obscured by the right hip replacement. Maximal transverse diameter 4.6 cm consistent with mild prostatomegaly. Other: No incarcerated ventral hernia is seen and no free fluid, hemorrhage or free air. Musculoskeletal: There are degenerative changes of the lower thoracic and lower lumbar spine. Right hip hemiarthroplasty components appear stable in configuration. No concerning regional skeletal lesions. IMPRESSION: 1. No appreciable acute abdominal or pelvic process to explain the patient's symptoms. 2. Constipation and diverticulosis without evidence of bowel obstruction or inflammation. 3. Distended but otherwise unremarkable gallbladder. 4. Mildly prominent mildly steatotic liver. 5. Nonobstructive left-sided micronephrolithiasis. Bladder partially obscured but unremarkable where visible. 6. Prostatomegaly. Distal right ureter and part of the prostate obscured by the right hip  replacement. Electronically Signed   By: KTelford NabM.D.   On: 11/04/2021 23:07   DG Abd 2 Views  Result Date: 11/08/2021 CLINICAL DATA:  Left lower quadrant pain and constipation. EXAM: ABDOMEN - 2 VIEW COMPARISON:  CT abdomen and pelvis 11/04/2021. FINDINGS: The bowel gas pattern is normal. There is no evidence of free air. Colonic diverticula are again seen. There is a large amount of stool in the proximal colon. There are phleboliths in the right hemipelvis. Right hip arthroplasty is present. No acute fractures are seen. IMPRESSION: 1. Nonobstructive bowel  gas pattern. 2. Colonic diverticulosis. Electronically Signed   By: Ronney Asters M.D.   On: 11/08/2021 18:09     Subjective: Patient was seen and examined at bedside.  Overnight events noted.  Patient reports rectal bleeding has improved and slowing down. Patient is concerned about ongoing pain and discomfort.  Patient feels better want to be discharged.  Discharge Exam: Vitals:   11/10/21 2035 11/11/21 0517  BP: 123/64 125/77  Pulse: 89 78  Resp: 14 14  Temp: 98.2 F (36.8 C) 98 F (36.7 C)  SpO2: 100% 99%   Vitals:   11/10/21 1414 11/10/21 1443 11/10/21 2035 11/11/21 0517  BP: (!) 169/116 (!) 151/112 123/64 125/77  Pulse: 100 96 89 78  Resp: _0 Temp: 97.8 F (36.6 C)  98.2 F (36.8 C) 98 F (36.7 C)  TempSrc: Oral  Oral Oral  SpO2: 97%  100% 99%    General: Pt is alert, awake, not in acute distress Cardiovascular: RRR, S1/S2 +, no rubs, no gallops Respiratory: CTA bilaterally, no wheezing, no rhonchi Abdominal: Soft, NT, ND, bowel sounds + Extremities: no edema, no cyanosis    The results of significant diagnostics from this hospitalization (including imaging, microbiology, ancillary and laboratory) are listed below for reference.     Microbiology: Recent Results (from the past 240 hour(s))  Resp Panel by RT-PCR (Flu A&B, Covid) Nasopharyngeal Swab     Status: None   Collection Time: 11/04/21   6:07 PM   Specimen: Nasopharyngeal Swab; Nasopharyngeal(NP) swabs in vial transport medium  Result Value Ref Range Status   SARS Coronavirus 2 by RT PCR NEGATIVE NEGATIVE Final    Comment: (NOTE) SARS-CoV-2 target nucleic acids are NOT DETECTED.  The SARS-CoV-2 RNA is generally detectable in upper respiratory specimens during the acute phase of infection. The lowest concentration of SARS-CoV-2 viral copies this assay can detect is 138 copies/mL. A negative result does not preclude SARS-Cov-2 infection and should not be used as the sole basis for treatment or other patient management decisions. A negative result may occur with  improper specimen collection/handling, submission of specimen other than nasopharyngeal swab, presence of viral mutation(s) within the areas targeted by this assay, and inadequate number of viral copies(<138 copies/mL). A negative result must be combined with clinical observations, patient history, and epidemiological information. The expected result is Negative.  Fact Sheet for Patients:  EntrepreneurPulse.com.au  Fact Sheet for Healthcare Providers:  IncredibleEmployment.be  This test is no t yet approved or cleared by the [place] FDA and  has been authorized for detection and/or diagnosis of SARS-CoV-2 by FDA under an Emergency Use Authorization (EUA). This EUA will remain  in effect (meaning this test can be used) for the duration of the COVID-19 declaration under Section 564(b)(1) of the Act, 21 U.S.C.section 360bbb-3(b)(1), unless the authorization is terminated  or revoked sooner.       Influenza A by PCR NEGATIVE NEGATIVE Final   Influenza B by PCR NEGATIVE NEGATIVE Final    Comment: (NOTE) The Xpert Xpress SARS-CoV-2/FLU/RSV plus assay is intended as an aid in the diagnosis of influenza from Nasopharyngeal swab specimens and should not be used as a sole basis for treatment. Nasal washings and aspirates  are unacceptable for Xpert Xpress SARS-CoV-2/FLU/RSV testing.  Fact Sheet for Patients: EntrepreneurPulse.com.au  Fact Sheet for Healthcare Providers: IncredibleEmployment.be  This test is not yet approved or cleared by the [place] FDA and has been authorized for detection and/or diagnosis of SARS-CoV-2 by FDA  under an Emergency Use Authorization (EUA). This EUA will remain in effect (meaning this test can be used) for the duration of the COVID-19 declaration under Section 564(b)(1) of the Act, 21 U.S.C. section 360bbb-3(b)(1), unless the authorization is terminated or revoked.  Performed at [facility], [place] [address], [place], [place] [address]   Resp Panel by RT-PCR (Flu A&B, Covid) Nasopharyngeal Swab     Status: None   Collection Time: 11/08/21  5:20 PM   Specimen: Nasopharyngeal Swab; Nasopharyngeal(NP) swabs in vial transport medium  Result Value Ref Range Status   SARS Coronavirus 2 by RT PCR NEGATIVE NEGATIVE Final    Comment: (NOTE) SARS-CoV-2 target nucleic acids are NOT DETECTED.  The SARS-CoV-2 RNA is generally detectable in upper respiratory specimens during the acute phase of infection. The lowest concentration of SARS-CoV-2 viral copies this assay can detect is 138 copies/mL. A negative result does not preclude SARS-Cov-2 infection and should not be used as the sole basis for treatment or other patient management decisions. A negative result may occur with  improper specimen collection/handling, submission of specimen other than nasopharyngeal swab, presence of viral mutation(s) within the areas targeted by this assay, and inadequate number of viral copies(<138 copies/mL). A negative result must be combined with clinical observations, patient history, and epidemiological information. The expected result is Negative.  Fact Sheet for Patients:  EntrepreneurPulse.com.au  Fact Sheet  for Healthcare Providers:  IncredibleEmployment.be  This test is no t yet approved or cleared by the [place] FDA and  has been authorized for detection and/or diagnosis of SARS-CoV-2 by FDA under an Emergency Use Authorization (EUA). This EUA will remain  in effect (meaning this test can be used) for the duration of the COVID-19 declaration under Section 564(b)(1) of the Act, 21 U.S.C.section 360bbb-3(b)(1), unless the authorization is terminated  or revoked sooner.       Influenza A by PCR NEGATIVE NEGATIVE Final   Influenza B by PCR NEGATIVE NEGATIVE Final    Comment: (NOTE) The Xpert Xpress SARS-CoV-2/FLU/RSV plus assay is intended as an aid in the diagnosis of influenza from Nasopharyngeal swab specimens and should not be used as a sole basis for treatment. Nasal washings and aspirates are unacceptable for Xpert Xpress SARS-CoV-2/FLU/RSV testing.  Fact Sheet for Patients: EntrepreneurPulse.com.au  Fact Sheet for Healthcare Providers: IncredibleEmployment.be  This test is not yet approved or cleared by the [place] FDA and has been authorized for detection and/or diagnosis of SARS-CoV-2 by FDA under an Emergency Use Authorization (EUA). This EUA will remain in effect (meaning this test can be used) for the duration of the COVID-19 declaration under Section 564(b)(1) of the Act, 21 U.S.C. section 360bbb-3(b)(1), unless the authorization is terminated or revoked.  Performed at [facility], [place] [address] [place],  [address]      Labs: BNP (last 3 results) No results for input(s): BNP in the last 8760 hours. Basic Metabolic Panel: Recent Labs  Lab 11/04/21 1731 11/08/21 1717 11/09/21 0559 11/10/21 0543 11/11/21 0515  NA 139 133* 137 136 137  K 4.3 4.1 4.6 4.3 4.8  CL 108 101 104 105 104  CO2 _0 GLUCOSE 112* 113* 105* 119* 123*  BUN _1 CREATININE 0.97 0.96 1.01 0.93 1.02  CALCIUM 8.7* 8.6* 8.6* 8.8* 8.6*  MG 2.4 1.9  --  2.2  --    Liver Function Tests: Recent Labs  Lab 11/04/21 1731 11/08/21 1717  11/09/21 0559 11/10/21 0543  AST _0 ALT _1 ALKPHOS 72 64 60 68  BILITOT 0.7 0.5 0.7 0.7  PROT 7.4 6.8 6.2* 6.8  ALBUMIN 3.9 3.7 3.3* 3.6   No results for input(s): LIPASE, AMYLASE in the last 168 hours. No results for input(s): AMMONIA in the last 168 hours. CBC: Recent Labs  Lab 11/05/21 0425 11/08/21 1717 11/09/21 0559 11/10/21 0543 11/11/21 0515  WBC 11.4* 10.8* 10.6* 10.2 9.5  NEUTROABS  --  7.5  --   --   --   HGB 12.9* 13.6 13.6 14.7 14.2  HCT 39.8 41.6 41.1 45.2 43.5  MCV 96.6 96.3 95.6 95.8 96.2  PLT 394 391 388 416* 379   Cardiac Enzymes: No results for input(s): CKTOTAL, CKMB, CKMBINDEX, TROPONINI in the last 168 hours. BNP: Invalid input(s): POCBNP CBG: Recent Labs  Lab 11/05/21 1627 11/08/21 1834 11/08/21 2250 11/09/21 0825 11/09/21 1135  GLUCAP 113* 89 96 117* 113*   D-Dimer No results for input(s): DDIMER in the last 72 hours. Hgb A1c No results for input(s): HGBA1C in the last 72 hours. Lipid Profile No results for input(s): CHOL, HDL, LDLCALC, TRIG, CHOLHDL, LDLDIRECT in the last 72 hours. Thyroid function studies No results for input(s): TSH, T4TOTAL, T3FREE, THYROIDAB in the last 72 hours.  Invalid input(s): FREET3 Anemia work up Recent Labs    11/08/21 1717  RETICCTPCT 1.8   Urinalysis    Component Value Date/Time   COLORURINE YELLOW 04/13/2018 1430   APPEARANCEUR CLEAR 04/13/2018 1430   LABSPEC 1.023 04/13/2018 1430   PHURINE 7.0 04/13/2018 1430   GLUCOSEU NEGATIVE 04/13/2018 1430   HGBUR NEGATIVE 04/13/2018 1430   BILIRUBINUR NEGATIVE 04/13/2018 1430   KETONESUR NEGATIVE 04/13/2018 1430   PROTEINUR NEGATIVE 04/13/2018 1430   NITRITE NEGATIVE 04/13/2018 1430   LEUKOCYTESUR NEGATIVE 04/13/2018 1430   Sepsis Labs Invalid input(s):  PROCALCITONIN,  WBC,  LACTICIDVEN Microbiology Recent Results (from the past 240 hour(s))  Resp Panel by RT-PCR (Flu A&B, Covid) Nasopharyngeal Swab     Status: None   Collection Time: 11/04/21  6:07 PM   Specimen: Nasopharyngeal Swab; Nasopharyngeal(NP) swabs in vial transport medium  Result Value Ref Range Status   SARS Coronavirus 2 by RT PCR NEGATIVE NEGATIVE Final    Comment: (NOTE) SARS-CoV-2 target nucleic acids are NOT DETECTED.  The SARS-CoV-2 RNA is generally detectable in upper respiratory specimens during the acute phase of infection. The lowest concentration of SARS-CoV-2 viral copies this assay can detect is 138 copies/mL. A negative result does not preclude SARS-Cov-2 infection and should not be used as the sole basis for treatment or other patient management decisions. A negative result may occur with  improper specimen collection/handling, submission of specimen other than nasopharyngeal swab, presence of viral mutation(s) within the areas targeted by this assay, and inadequate number of viral copies(<138 copies/mL). A negative result must be combined with clinical observations, patient history, and epidemiological information. The expected result is Negative.  Fact Sheet for Patients:  EntrepreneurPulse.com.au  Fact Sheet for Healthcare Providers:  IncredibleEmployment.be  This test is no t yet approved or cleared by the [place] FDA and  has been authorized for detection and/or diagnosis of SARS-CoV-2 by FDA under an Emergency Use Authorization (EUA). This EUA will remain  in effect (meaning this test can be used) for the duration of the COVID-19 declaration under Section 564(b)(1) of the Act, 21 U.S.C.section 360bbb-3(b)(1), unless the authorization is terminated  or revoked  sooner.       Influenza A by PCR NEGATIVE NEGATIVE Final   Influenza B by PCR NEGATIVE NEGATIVE Final    Comment: (NOTE) The Xpert Xpress  SARS-CoV-2/FLU/RSV plus assay is intended as an aid in the diagnosis of influenza from Nasopharyngeal swab specimens and should not be used as a sole basis for treatment. Nasal washings and aspirates are unacceptable for Xpert Xpress SARS-CoV-2/FLU/RSV testing.  Fact Sheet for Patients: EntrepreneurPulse.com.au  Fact Sheet for Healthcare Providers: IncredibleEmployment.be  This test is not yet approved or cleared by the [place] FDA and has been authorized for detection and/or diagnosis of SARS-CoV-2 by FDA under an Emergency Use Authorization (EUA). This EUA will remain in effect (meaning this test can be used) for the duration of the COVID-19 declaration under Section 564(b)(1) of the Act, 21 U.S.C. section 360bbb-3(b)(1), unless the authorization is terminated or revoked.  Performed at [facility], [place] [address], [place], [place] [address]   Resp Panel by RT-PCR (Flu A&B, Covid) Nasopharyngeal Swab     Status: None   Collection Time: 11/08/21  5:20 PM   Specimen: Nasopharyngeal Swab; Nasopharyngeal(NP) swabs in vial transport medium  Result Value Ref Range Status   SARS Coronavirus 2 by RT PCR NEGATIVE NEGATIVE Final    Comment: (NOTE) SARS-CoV-2 target nucleic acids are NOT DETECTED.  The SARS-CoV-2 RNA is generally detectable in upper respiratory specimens during the acute phase of infection. The lowest concentration of SARS-CoV-2 viral copies this assay can detect is 138 copies/mL. A negative result does not preclude SARS-Cov-2 infection and should not be used as the sole basis for treatment or other patient management decisions. A negative result may occur with  improper specimen collection/handling, submission of specimen other than nasopharyngeal swab, presence of viral mutation(s) within the areas targeted by this assay, and inadequate number of viral copies(<138 copies/mL). A negative result must be  combined with clinical observations, patient history, and epidemiological information. The expected result is Negative.  Fact Sheet for Patients:  EntrepreneurPulse.com.au  Fact Sheet for Healthcare Providers:  IncredibleEmployment.be  This test is no t yet approved or cleared by the [place] FDA and  has been authorized for detection and/or diagnosis of SARS-CoV-2 by FDA under an Emergency Use Authorization (EUA). This EUA will remain  in effect (meaning this test can be used) for the duration of the COVID-19 declaration under Section 564(b)(1) of the Act, 21 U.S.C.section 360bbb-3(b)(1), unless the authorization is terminated  or revoked sooner.       Influenza A by PCR NEGATIVE NEGATIVE Final   Influenza B by PCR NEGATIVE NEGATIVE Final    Comment: (NOTE) The Xpert Xpress SARS-CoV-2/FLU/RSV plus assay is intended as an aid in the diagnosis of influenza from Nasopharyngeal swab specimens and should not be used as a sole basis for treatment. Nasal washings and aspirates are unacceptable for Xpert Xpress SARS-CoV-2/FLU/RSV testing.  Fact Sheet for Patients: EntrepreneurPulse.com.au  Fact Sheet for Healthcare Providers: IncredibleEmployment.be  This test is not yet approved or cleared by the [place] FDA and has been authorized for detection and/or diagnosis of SARS-CoV-2 by FDA under an Emergency Use Authorization (EUA). This EUA will remain in effect (meaning this test can be used) for the duration of the COVID-19 declaration under Section 564(b)(1) of the Act, 21 U.S.C. section 360bbb-3(b)(1), unless the authorization is terminated or revoked.  Performed at [facility], [place] [address], [place], [place] [address]      Time coordinating discharge:  Over 30 minutes  SIGNED:   Shawna Clamp, MD  [facility] 11/11/2021, 4:36 PM Pager   If 7PM-7AM, please  contact night-coverage

## 2021-11-11 NOTE — Progress Notes (Signed)
Pt discharged to home. DC instructions given. All concerns of patient's addressed by Providers prior to discharge. Pt left unit ambulatory per pt's preference. Left in stable condition. VWilliams, Therapist, sports.

## 2021-11-11 NOTE — Telephone Encounter (Signed)
Per Dr. Benay Spice: Needs to reschedule appointment with Dr. Joan Flores asap at [facility]. F/U with surgeon and get hemorrhoid issue resolved. Resume Xarelto when hemorrhoid issue is resolve and no further bleeding and resume at 10 mg/day. Hold until then. Needs OV with Dr. Benay Spice in 3-4 weeks. Scheduling message sent. Called and left patient voice mail with all the above instructions.

## 2021-11-11 NOTE — Discharge Instructions (Signed)
Advised to follow-up with general surgery Dr. Leighton Ruff in 1 week. Advised to resume Xarelto in 2 to 3 days after discharge. Advised to take pain medications as needed. Advised sitz bath, high-fiber diet, stool softeners.

## 2021-11-12 ENCOUNTER — Other Ambulatory Visit: Payer: Self-pay | Admitting: *Deleted

## 2021-11-12 NOTE — Patient Outreach (Signed)
[facility]) Care Management  11/12/2021  [name], MD [birth year] [id number]   Transition of care telephone call  Referral received: 1/26 Initial outreach: 1/31 Insurance: [place]    Initial unsuccessful telephone call to patient's preferred number in order to complete transition of care assessment; no answer, left HIPAA compliant voicemail message requesting return call.   Objective: Per the electronic medical record, Mr. Aymond was hospitalized at [facility] from 1/23-1/24 then again from 1/27-1/30 for acute lower GI bleed. Comorbidities include: Controlled DM, OA of the knee, and HLD.  He was discharged to home on 1/30 without the need for home health services or durable medical equipment per the discharge summary.    Plan: If no return call from patient by the end of business day today, this RNCM will route unsuccessful outreach letter with [place] Management pamphlet and 24 hour Nurse Advice Line Magnet to [place] Management clinical pool to be mailed to patient's home address. This RNCM will attempt another outreach within 4 business days.  Valente David, RN, MSN, [place] Manager [phone]

## 2021-11-15 ENCOUNTER — Other Ambulatory Visit: Payer: Self-pay | Admitting: *Deleted

## 2021-11-15 NOTE — Patient Outreach (Signed)
[facility]) Care Management  11/15/2021  Daniel Bossier, MD Apr 22, 1961 [id number]    Transition of care telephone call   Referral received: 1/26 Initial outreach: 1/31 Insurance: [place] attempt #2, unsuccessful telephone call to patient's preferred number in order to complete transition of care assessment; no answer, left HIPAA compliant voicemail message requesting return call.    Objective: Per the electronic medical record, [name] was hospitalized at [facility] from 1/23-1/24 then again from 1/27-1/30 for acute lower GI bleed. Comorbidities include: Controlled DM, OA of the knee, and HLD.  He was discharged to home on 1/30 without the need for home health services or durable medical equipment per the discharge summary.      Plan: If no return call from patient by the end of business day today, this RNCM will attempt another outreach within 4 business days.  Valente David, RN, MSN, [place] Manager [phone]

## 2021-11-19 ENCOUNTER — Telehealth: Payer: Self-pay | Admitting: *Deleted

## 2021-11-19 NOTE — Telephone Encounter (Signed)
Attempted to reach Dr. Sherral Hammers to determine if he has rescheduled his appointment w/Dr. Joan Flores at [facility]. Mailbox was full. He does have appointment w/Dr. Leighton Ruff on 05/19/75.

## 2021-11-20 ENCOUNTER — Other Ambulatory Visit: Payer: Self-pay | Admitting: *Deleted

## 2021-11-20 NOTE — Patient Outreach (Signed)
[name]) Care Management  11/20/2021  Daniel Bossier, MD 1960/12/18 [id number]   Transition of care telephone call   Referral received: 1/26 Initial outreach: 1/31 Insurance: [place] attempt #3, unsuccessful telephone call to patient's preferred number in order to complete transition of care assessment; no answer, mailbox full.    Objective: Per the electronic medical record, Mr. Hemann was hospitalized at [facility] from 1/23-1/24 then again from 1/27-1/30 for acute lower GI bleed. Comorbidities include: Controlled DM, OA of the knee, and HLD.  He was discharged to home on 1/30 without the need for home health services or durable medical equipment per the discharge summary.      Plan: If no return call from patient by the end of business day today, will make 4th and final attempt within the next 4 weeks, if remain unsuccessful will close case due to inability to maintain contact.  Valente David, RN, MSN, [place] Manager [phone]

## 2021-11-21 DIAGNOSIS — K641 Second degree hemorrhoids: Secondary | ICD-10-CM | POA: Diagnosis not present

## 2021-11-25 DIAGNOSIS — M9905 Segmental and somatic dysfunction of pelvic region: Secondary | ICD-10-CM | POA: Diagnosis not present

## 2021-11-25 DIAGNOSIS — M9904 Segmental and somatic dysfunction of sacral region: Secondary | ICD-10-CM | POA: Diagnosis not present

## 2021-11-25 DIAGNOSIS — M5136 Other intervertebral disc degeneration, lumbar region: Secondary | ICD-10-CM | POA: Diagnosis not present

## 2021-11-25 DIAGNOSIS — M9903 Segmental and somatic dysfunction of lumbar region: Secondary | ICD-10-CM | POA: Diagnosis not present

## 2021-11-28 ENCOUNTER — Other Ambulatory Visit (HOSPITAL_COMMUNITY): Payer: Self-pay

## 2021-11-28 MED ORDER — PREDNISONE 20 MG PO TABS
40.0000 mg | ORAL_TABLET | Freq: Every day | ORAL | 1 refills | Status: DC
  Filled 2021-11-28: qty 10, 5d supply, fill #0
  Filled 2021-12-12: qty 10, 5d supply, fill #1

## 2021-11-29 ENCOUNTER — Other Ambulatory Visit (HOSPITAL_COMMUNITY): Payer: Self-pay

## 2021-12-03 ENCOUNTER — Ambulatory Visit: Payer: 59 | Admitting: Internal Medicine

## 2021-12-10 ENCOUNTER — Inpatient Hospital Stay: Payer: 59 | Admitting: Oncology

## 2021-12-10 ENCOUNTER — Telehealth: Payer: Self-pay | Admitting: Oncology

## 2021-12-10 ENCOUNTER — Telehealth: Payer: Self-pay | Admitting: *Deleted

## 2021-12-10 DIAGNOSIS — Z760 Encounter for issue of repeat prescription: Secondary | ICD-10-CM | POA: Diagnosis not present

## 2021-12-10 NOTE — Telephone Encounter (Signed)
Contacted Dr. Sherral Hammers to scheduled appt from in basket message. He advised that he will be calling back to to finish scheduling.

## 2021-12-10 NOTE — Telephone Encounter (Signed)
Patient called to cancel OV today since MD saw him in the hospital. Has appointment w/Dr. Joan Flores at [facility] on 04/01/22 (on wait list). Sees Dr. Marcello [place] next week to discuss possible surgery. Is on Xarelto at half dose ( 10 mg daily) with no bleeding. Asking when Dr. Benay Spice needs to see him again. Per Dr. Benay Spice: OV week after seeing Dr. Joan Flores with no labs. Scheduling message sent.

## 2021-12-11 ENCOUNTER — Other Ambulatory Visit: Payer: Self-pay | Admitting: *Deleted

## 2021-12-11 NOTE — Patient Outreach (Signed)
[facility]) Care Management ? ?12/11/2021 ? ?[name], MD ?04-14-1961 ?[id number] ? ? ?Transition of care telephone call ?  ?Referral received: 1/26 ?Initial outreach: 1/31 ?Insurance: [place] Focus/Choice/Save Plan ?  ?  ?  ?Outreach attempt #4, unsuccessful telephone call to patient's preferred number in order to complete transition of care assessment; no answer, mailbox full.  ?  ?Objective: ?Per the electronic medical record, Mr. Merolla was hospitalized at [facility] from 1/23-1/24 then again from 1/27-1/30 for acute lower GI bleed. Comorbidities include: Controlled DM, OA of the knee, and HLD.  He was discharged to home on 1/30 without the need for home health services or durable medical equipment per the discharge summary.  ?  ?  ?Plan: ?No response from member after multiple unsuccessful outreach attempts and letter sent.  Will close case at this time due to inability to maintain contact.  Will notify member of case closure. ? ?Valente David, RN, MSN, CCM ?[facility] Care Management  ?Community Care Manager ?[phone] ? ?

## 2021-12-12 ENCOUNTER — Other Ambulatory Visit: Payer: Self-pay | Admitting: Internal Medicine

## 2021-12-12 ENCOUNTER — Other Ambulatory Visit (HOSPITAL_COMMUNITY): Payer: Self-pay

## 2021-12-13 ENCOUNTER — Other Ambulatory Visit (HOSPITAL_COMMUNITY): Payer: Self-pay

## 2021-12-13 MED ORDER — DICYCLOMINE HCL 20 MG PO TABS
20.0000 mg | ORAL_TABLET | ORAL | 0 refills | Status: DC
  Filled 2021-12-13: qty 60, 10d supply, fill #0

## 2021-12-16 ENCOUNTER — Other Ambulatory Visit (HOSPITAL_COMMUNITY): Payer: Self-pay

## 2021-12-16 DIAGNOSIS — K641 Second degree hemorrhoids: Secondary | ICD-10-CM | POA: Diagnosis not present

## 2021-12-17 ENCOUNTER — Telehealth: Payer: Self-pay

## 2021-12-17 ENCOUNTER — Other Ambulatory Visit (HOSPITAL_COMMUNITY): Payer: Self-pay

## 2021-12-17 NOTE — Telephone Encounter (Signed)
Pt has follow-up appt with Dr. Henrene Pastor tomorrow but wants to know if he needs to keep the appt. He saw Dr. Marcello [place] yesterday and she placed several bands on his hemorrhoids and reports he is supposed to go back to have some more banded. Please advise if he needs to keep OV. ?

## 2021-12-17 NOTE — Telephone Encounter (Signed)
Spoke with pt and he is aware. Appt cancelled. ?

## 2021-12-17 NOTE — Telephone Encounter (Signed)
He does not need to follow-up with me tomorrow. ?Please remove and block that slot. ?Thanks ? ?

## 2021-12-18 ENCOUNTER — Other Ambulatory Visit (HOSPITAL_COMMUNITY): Payer: Self-pay

## 2021-12-18 ENCOUNTER — Ambulatory Visit: Payer: 59 | Admitting: Internal Medicine

## 2021-12-20 ENCOUNTER — Other Ambulatory Visit (HOSPITAL_COMMUNITY): Payer: Self-pay

## 2021-12-27 ENCOUNTER — Other Ambulatory Visit (HOSPITAL_COMMUNITY): Payer: Self-pay

## 2021-12-27 ENCOUNTER — Other Ambulatory Visit: Payer: Self-pay | Admitting: Internal Medicine

## 2021-12-27 MED ORDER — DIAZEPAM 5 MG PO TABS
5.0000 mg | ORAL_TABLET | Freq: Two times a day (BID) | ORAL | 1 refills | Status: DC
  Filled 2021-12-27: qty 180, 90d supply, fill #0
  Filled 2022-03-31: qty 180, 90d supply, fill #1

## 2021-12-27 MED ORDER — DICYCLOMINE HCL 20 MG PO TABS
20.0000 mg | ORAL_TABLET | ORAL | 0 refills | Status: DC | PRN
  Filled 2021-12-27: qty 60, 10d supply, fill #0

## 2021-12-27 MED ORDER — TRAMADOL HCL 50 MG PO TABS
50.0000 mg | ORAL_TABLET | Freq: Four times a day (QID) | ORAL | 0 refills | Status: DC | PRN
  Filled 2021-12-27: qty 240, 30d supply, fill #0

## 2021-12-27 MED ORDER — OXYCODONE HCL 5 MG PO TABS
5.0000 mg | ORAL_TABLET | Freq: Three times a day (TID) | ORAL | 0 refills | Status: DC | PRN
  Filled 2021-12-27: qty 180, 30d supply, fill #0

## 2021-12-27 MED ORDER — PREDNISONE 20 MG PO TABS
40.0000 mg | ORAL_TABLET | Freq: Every day | ORAL | 0 refills | Status: DC
  Filled 2021-12-27: qty 14, 7d supply, fill #0

## 2021-12-27 MED ORDER — ZOLPIDEM TARTRATE 10 MG PO TABS
10.0000 mg | ORAL_TABLET | Freq: Every evening | ORAL | 1 refills | Status: DC | PRN
  Filled 2021-12-27 – 2021-12-30 (×2): qty 90, 90d supply, fill #0
  Filled 2022-03-31: qty 90, 90d supply, fill #1

## 2021-12-27 MED ORDER — TOPIRAMATE 25 MG PO TABS
25.0000 mg | ORAL_TABLET | Freq: Every day | ORAL | 3 refills | Status: DC
  Filled 2021-12-27: qty 30, 30d supply, fill #0
  Filled 2022-02-06: qty 30, 30d supply, fill #1
  Filled 2022-03-05: qty 30, 30d supply, fill #2
  Filled 2022-03-31: qty 30, 30d supply, fill #3

## 2021-12-30 ENCOUNTER — Other Ambulatory Visit (HOSPITAL_COMMUNITY): Payer: Self-pay

## 2022-01-02 ENCOUNTER — Other Ambulatory Visit (HOSPITAL_COMMUNITY): Payer: Self-pay

## 2022-01-03 ENCOUNTER — Other Ambulatory Visit (HOSPITAL_COMMUNITY): Payer: Self-pay

## 2022-01-03 MED ORDER — BUTALBITAL-ASA-CAFFEINE 50-325-40 MG PO CAPS
1.0000 | ORAL_CAPSULE | ORAL | 0 refills | Status: DC
  Filled 2022-01-03 – 2022-01-06 (×2): qty 180, 30d supply, fill #0

## 2022-01-06 ENCOUNTER — Other Ambulatory Visit (HOSPITAL_COMMUNITY): Payer: Self-pay

## 2022-01-06 MED ORDER — BUTALBITAL-APAP-CAFFEINE 50-325-40 MG PO TABS
1.0000 | ORAL_TABLET | ORAL | 0 refills | Status: DC
  Filled 2022-01-06: qty 180, 30d supply, fill #0

## 2022-01-07 ENCOUNTER — Other Ambulatory Visit (HOSPITAL_COMMUNITY): Payer: Self-pay

## 2022-01-08 ENCOUNTER — Other Ambulatory Visit (HOSPITAL_COMMUNITY): Payer: Self-pay

## 2022-01-08 DIAGNOSIS — M9904 Segmental and somatic dysfunction of sacral region: Secondary | ICD-10-CM | POA: Diagnosis not present

## 2022-01-08 DIAGNOSIS — M9903 Segmental and somatic dysfunction of lumbar region: Secondary | ICD-10-CM | POA: Diagnosis not present

## 2022-01-08 DIAGNOSIS — M5136 Other intervertebral disc degeneration, lumbar region: Secondary | ICD-10-CM | POA: Diagnosis not present

## 2022-01-08 DIAGNOSIS — M9905 Segmental and somatic dysfunction of pelvic region: Secondary | ICD-10-CM | POA: Diagnosis not present

## 2022-01-09 DIAGNOSIS — Z1331 Encounter for screening for depression: Secondary | ICD-10-CM | POA: Diagnosis not present

## 2022-01-09 DIAGNOSIS — Z0000 Encounter for general adult medical examination without abnormal findings: Secondary | ICD-10-CM | POA: Diagnosis not present

## 2022-01-09 DIAGNOSIS — F5104 Psychophysiologic insomnia: Secondary | ICD-10-CM | POA: Diagnosis not present

## 2022-01-09 DIAGNOSIS — I10 Essential (primary) hypertension: Secondary | ICD-10-CM | POA: Diagnosis not present

## 2022-01-09 DIAGNOSIS — E1151 Type 2 diabetes mellitus with diabetic peripheral angiopathy without gangrene: Secondary | ICD-10-CM | POA: Diagnosis not present

## 2022-01-09 DIAGNOSIS — Z1212 Encounter for screening for malignant neoplasm of rectum: Secondary | ICD-10-CM | POA: Diagnosis not present

## 2022-01-09 DIAGNOSIS — R82998 Other abnormal findings in urine: Secondary | ICD-10-CM | POA: Diagnosis not present

## 2022-01-09 DIAGNOSIS — I739 Peripheral vascular disease, unspecified: Secondary | ICD-10-CM | POA: Diagnosis not present

## 2022-01-09 DIAGNOSIS — E785 Hyperlipidemia, unspecified: Secondary | ICD-10-CM | POA: Diagnosis not present

## 2022-01-09 DIAGNOSIS — G43909 Migraine, unspecified, not intractable, without status migrainosus: Secondary | ICD-10-CM | POA: Diagnosis not present

## 2022-01-09 DIAGNOSIS — K5903 Drug induced constipation: Secondary | ICD-10-CM | POA: Diagnosis not present

## 2022-01-10 ENCOUNTER — Other Ambulatory Visit (HOSPITAL_COMMUNITY): Payer: Self-pay

## 2022-01-10 MED ORDER — NURTEC 75 MG PO TBDP
1.0000 | ORAL_TABLET | Freq: Every day | ORAL | 6 refills | Status: DC
Start: 2022-01-10 — End: 2022-10-28
  Filled 2022-01-10: qty 10, 15d supply, fill #0
  Filled 2022-03-31: qty 10, 15d supply, fill #1
  Filled 2022-05-20: qty 10, 30d supply, fill #2
  Filled 2022-06-09: qty 10, 30d supply, fill #3
  Filled 2022-07-09: qty 10, 30d supply, fill #4
  Filled 2022-09-02: qty 10, 30d supply, fill #5
  Filled 2022-09-22: qty 10, 30d supply, fill #6

## 2022-01-10 MED ORDER — MOVANTIK 25 MG PO TABS
25.0000 mg | ORAL_TABLET | Freq: Every morning | ORAL | 6 refills | Status: DC
  Filled 2022-01-10: qty 30, 30d supply, fill #0
  Filled 2022-03-05: qty 30, 30d supply, fill #1
  Filled 2022-03-31: qty 30, 30d supply, fill #2
  Filled 2022-05-20: qty 30, 30d supply, fill #3
  Filled 2022-07-09: qty 30, 30d supply, fill #4
  Filled 2022-09-02: qty 30, 30d supply, fill #5
  Filled 2022-09-22: qty 30, 30d supply, fill #6

## 2022-01-10 MED ORDER — ROSUVASTATIN CALCIUM 20 MG PO TABS
20.0000 mg | ORAL_TABLET | Freq: Every day | ORAL | 3 refills | Status: DC
  Filled 2022-01-10: qty 90, 90d supply, fill #0
  Filled 2022-03-31: qty 90, 90d supply, fill #1
  Filled 2022-07-09: qty 90, 90d supply, fill #2
  Filled 2022-10-28: qty 90, 90d supply, fill #3

## 2022-01-13 ENCOUNTER — Other Ambulatory Visit (HOSPITAL_COMMUNITY): Payer: Self-pay

## 2022-01-13 DIAGNOSIS — K641 Second degree hemorrhoids: Secondary | ICD-10-CM | POA: Diagnosis not present

## 2022-01-14 ENCOUNTER — Other Ambulatory Visit (HOSPITAL_COMMUNITY): Payer: Self-pay

## 2022-01-15 ENCOUNTER — Other Ambulatory Visit (HOSPITAL_COMMUNITY): Payer: Self-pay

## 2022-01-15 DIAGNOSIS — M79672 Pain in left foot: Secondary | ICD-10-CM | POA: Diagnosis not present

## 2022-01-15 DIAGNOSIS — M722 Plantar fascial fibromatosis: Secondary | ICD-10-CM | POA: Diagnosis not present

## 2022-01-15 MED ORDER — MOUNJARO 5 MG/0.5ML ~~LOC~~ SOAJ
5.0000 mg | SUBCUTANEOUS | 6 refills | Status: DC
Start: 2022-01-15 — End: 2023-01-29
  Filled 2022-02-06: qty 2, 28d supply, fill #0
  Filled 2022-09-22: qty 2, 28d supply, fill #1
  Filled 2022-10-28: qty 2, 28d supply, fill #2
  Filled 2022-12-05: qty 2, 28d supply, fill #3

## 2022-01-15 MED ORDER — MOUNJARO 2.5 MG/0.5ML ~~LOC~~ SOAJ
2.5000 mg | SUBCUTANEOUS | 0 refills | Status: DC
Start: 2022-01-15 — End: 2023-02-27
  Filled 2022-01-15: qty 2, 28d supply, fill #0

## 2022-01-21 ENCOUNTER — Other Ambulatory Visit (HOSPITAL_COMMUNITY): Payer: Self-pay

## 2022-01-23 ENCOUNTER — Other Ambulatory Visit (HOSPITAL_COMMUNITY): Payer: Self-pay

## 2022-01-24 ENCOUNTER — Other Ambulatory Visit (HOSPITAL_COMMUNITY): Payer: Self-pay

## 2022-02-06 ENCOUNTER — Other Ambulatory Visit (HOSPITAL_COMMUNITY): Payer: Self-pay

## 2022-02-06 MED ORDER — DICLOFENAC SODIUM 1 % EX GEL
1.0000 "application " | Freq: Three times a day (TID) | CUTANEOUS | 5 refills | Status: DC
  Filled 2022-02-06: qty 100, 30d supply, fill #0
  Filled 2022-03-05: qty 100, 30d supply, fill #1
  Filled 2022-03-31: qty 100, 30d supply, fill #2
  Filled 2022-05-20 (×2): qty 100, 30d supply, fill #3
  Filled 2022-07-09: qty 100, 30d supply, fill #4

## 2022-02-06 MED ORDER — MOUNJARO 5 MG/0.5ML ~~LOC~~ SOAJ
5.0000 mg | SUBCUTANEOUS | 6 refills | Status: DC
  Filled 2022-02-06: qty 2, 28d supply, fill #0
  Filled 2022-03-05: qty 6, 84d supply, fill #0
  Filled 2022-06-09: qty 6, 84d supply, fill #1
  Filled 2022-09-02: qty 2, 28d supply, fill #2

## 2022-02-11 ENCOUNTER — Other Ambulatory Visit (HOSPITAL_COMMUNITY): Payer: Self-pay

## 2022-02-12 ENCOUNTER — Other Ambulatory Visit (HOSPITAL_COMMUNITY): Payer: Self-pay

## 2022-02-13 ENCOUNTER — Encounter: Payer: Self-pay | Admitting: Physical Therapy

## 2022-02-13 ENCOUNTER — Ambulatory Visit: Payer: 59 | Attending: Orthopedic Surgery | Admitting: Physical Therapy

## 2022-02-13 ENCOUNTER — Other Ambulatory Visit: Payer: Self-pay

## 2022-02-13 DIAGNOSIS — M79672 Pain in left foot: Secondary | ICD-10-CM | POA: Diagnosis not present

## 2022-02-13 DIAGNOSIS — M79671 Pain in right foot: Secondary | ICD-10-CM | POA: Diagnosis not present

## 2022-02-13 DIAGNOSIS — M6281 Muscle weakness (generalized): Secondary | ICD-10-CM | POA: Insufficient documentation

## 2022-02-13 NOTE — Therapy (Signed)
?OUTPATIENT PHYSICAL THERAPY EVALUATION ? ? ?Patient Name: Daniel WHITEHURST, MD ?MRN: [id number] ?DOB:02-May-1961, 61 y.o., male ?Today's Date: 02/13/2022 ? ? PT End of Session - 02/13/22 1401   ? ? Visit Number 1   ? Number of Visits 13   ? Date for PT Re-Evaluation 03/27/22   ? Authorization Type [place] UMR   ? PT Start Time 1400   ? PT Stop Time 0258   ? PT Time Calculation (min) 45 min   ? Activity Tolerance Patient tolerated treatment well   ? Behavior During Therapy [facility] for tasks assessed/performed   ? ?  ?  ? ?  ? ? ?Past Medical History:  ?Diagnosis Date  ? Anxiety   ? Aortic atherosclerosis ([place])   ? BPH (benign prostatic hyperplasia)   ? Chronic pain syndrome   ? neck and low back  ? DDD (degenerative disc disease), cervical   ? Diverticulosis   ? DJD (degenerative joint disease)   ? Ganglion cyst   ? 12 mm , right posterior knee  ? Hyperlipidemia   ? Internal hemorrhoids   ? Migraines   ? OA (osteoarthritis)   ? knees, hands, right shoulder  ? Positive QuantiFERON-TB Gold test   ? Shoulder pain   ? Tear of right rotator cuff   ? Type 2 diabetes mellitus ([place])   ? last A1c 5.4 on 04-13-2018 in epic (followed by pcp)  ? Wears glasses   ? ?Past Surgical History:  ?Procedure Laterality Date  ? ANAL FISSURE REPAIR    ? ARTHOSCOPIC ROTAOR CUFF REPAIR Right 06/15/2018  ? Procedure: RIGHT SHOULDER ARTHROSCOPY, DEBRIDEMENT, SUBACROMIAL DECOMPRESSION, DISTAL CLAVICLE RESECTION, ROTATOR CUFF REPAIR;  Surgeon: [name], MD;  Location: [facility];  Service: Orthopedics;  Laterality: Right;  ? COLONOSCOPY  05-26-2017   dr Henrene Pastor  ? FLEXIBLE SIGMOIDOSCOPY N/A 11/05/2021  ? Procedure: FLEXIBLE SIGMOIDOSCOPY;  Surgeon: Milus Banister, MD;  Location: [facility];  Service: Endoscopy;  Laterality: N/A;  ? Hip Resurface  2006  ? KNEE ARTHROSCOPY W/ MENISCAL REPAIR Bilateral right 1993; left 2010  ? MASS EXCISION Right 04/21/2018  ? Procedure: EXCISION RIGHT THIGH SOFT TISSUE MASS;  Surgeon: [name],  MD;  Location: [facility];  Service: Orthopedics;  Laterality: Right;  ? SEPTOPLASTY    ? SHOULDER ARTHROSCOPY WITH ROTATOR CUFF REPAIR Left   ? SLAP Tear Repair  2008  ? TONSILLECTOMY    ? TOTAL KNEE ARTHROPLASTY Left 12/08/2016  ? Procedure: LEFT TOTAL KNEE ARTHROPLASTY;  Surgeon: [name], MD;  Location: [facility];  Service: Orthopedics;  Laterality: Left;  ? TOTAL KNEE ARTHROPLASTY WITH REVISION COMPONENTS Left 04/21/2018  ? Procedure: Left knee polyethylene exchange ;  Surgeon: [name], MD;  Location: [facility];  Service: Orthopedics;  Laterality: Left;  ? ?Patient Active Problem List  ? Diagnosis Date Noted  ? Rectal bleed 11/10/2021  ? BRBPR (bright red blood per rectum) 11/09/2021  ? Controlled type 2 diabetes mellitus without complication, without long-term current use of insulin ([place]) 11/09/2021  ? Hyperlipidemia 11/09/2021  ? Chronic musculoskeletal pain 11/09/2021  ? Oropharyngeal candidiasis 11/09/2021  ? Hoarse voice quality 11/09/2021  ? Near syncope 11/09/2021  ? Acute lower GI bleeding 11/08/2021  ? Right shoulder injury 06/15/2018  ? S/P arthroscopy of right shoulder 06/15/2018  ? 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 OA (osteoarthritis) of knee 12/08/2016  ? 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 Cervical facet  joint syndrome 12/17/2015  ? Chronic bilateral low back pain without sciatica 12/17/2015  ? Primary osteoarthritis of left knee 12/17/2015  ? ? ?PCP: Donnajean Lopes, MD ? ?REFERRING PROVIDER: Wylene Simmer, MD ? ?REFERRING DIAG: Plantar fascilitis of left foot ? ?THERAPY DIAG:  ?Pain in left foot ? ?Pain in right foot ? ?Muscle weakness (generalized) ? ?ONSET DATE: Chronic ? ? ?SUBJECTIVE:  ?SUBJECTIVE STATEMENT: ?Patient reports he has bilateral plantar fasciitis. He has had several injections over the years. He states his doctor wants him to do dry needling since he has seen benefit in the past. He did have an injection about  4 weeks ago. He has had chronic foot pain, about 10-12 years ago, after his arches fell. He uses orthotics for arch support and lift in right shoe due leg length discrepancy. Pain worse in the morning before stretching exercises, or while at work while on his feet extended periods. ? ?PERTINENT HISTORY: ?DM II, BMI, chronic pain ? ?PAIN:  ?Are you having pain? Yes:  ?NPRS scale: 2-3/10 (9-10 at end of work) ?Pain location: Bilateral foot, arch (Right > left) ?Pain description: Intermittent, stiff and burning ?Aggravating factors: Pain worse first thing in the morning and then standing/walking extended periods on hard floors ?Relieving factors: Stretching, ice, tennis ball massage ? ?PRECAUTIONS: None ? ?WEIGHT BEARING RESTRICTIONS No ? ?FALLS:  ?Has patient fallen in last 6 months? No ? ?LIVING ENVIRONMENT: ?Lives with: lives with their family ? ?OCCUPATION: MD ? ?PLOF: Independent ? ?PATIENT GOALS: Pain relief and improve walking/standing while at work ? ? ?OBJECTIVE:  ?PATIENT SURVEYS:  ?FOTO 49% functional status ? ?COGNITION: ?Overall cognitive status: Within functional limits for tasks assessed   ?  ?SENSATION: ?WFL ? ?MUSCLE LENGTH: ?Calf flexibility limitation ? ?POSTURE:  ?Bilateral pes planus ? ?PALPATION: ?Tender with palpable trigger points bilateral calf region ? ?LE ROM: ? ?Active ROM Right ?02/13/2022 Left ?02/13/2022  ?Ankle dorsiflexion 0 -2  ?Ankle plantarflexion 50 50  ?Ankle inversion 20 25  ?Ankle eversion 10 10  ? ?LE MMT: ? ?MMT Right ?02/13/2022 Left ?02/13/2022  ?Hip flexion 5 5  ?Hip abduction 4 4  ?Knee flexion 5 5  ?Knee extension 5 5  ?Ankle dorsiflexion 5 5  ?Ankle plantarflexion 4- 4-  ?Ankle inversion 4 4  ?Ankle eversion 4 4  ? ?FUNCTIONAL TESTS:  ?SLS < 10 sec bilaterally ? ?GAIT: ?Assistive device utilized: None ?Level of assistance: Complete Independence ? ? ?TODAY'S TREATMENT: ?Seated figure-4 plantar fascia stretch ?Seated figure-4 banded ankle inversion with green x 15 each ?Longsitting  SMFR using tennis ball for calf with active release ?Standing calf stretch at wall 2 x 30 sec each ?Eccentric heel raise x 10 each ? ? ?PATIENT EDUCATION:  ?Education details: Exam findings, POC, HEP, dry needling future appointments ?Person educated: Patient ?Education method: Explanation, Demonstration, Tactile cues, Verbal cues, and Handouts ?Education comprehension: verbalized understanding, returned demonstration, verbal cues required, tactile cues required, and needs further education ? ?HOME EXERCISE PROGRAM: ?Access Code: [email or web address] ? ? ?ASSESSMENT: ?CLINICAL IMPRESSION: ?Patient is a 61 y.o. male who was seen today for physical therapy evaluation and treatment for bilateral foot pain consistent with plantar fasciitis.  ? ? ?OBJECTIVE IMPAIRMENTS decreased balance, decreased ROM, decreased strength, impaired flexibility, and pain.  ? ?ACTIVITY LIMITATIONS community activity and occupation.  ? ?PERSONAL FACTORS Fitness, Past/current experiences, and Time since onset of injury/illness/exacerbation are also affecting patient's functional outcome.  ? ? ?REHAB POTENTIAL: Good ? ?CLINICAL DECISION MAKING: Stable/uncomplicated ? ?EVALUATION  COMPLEXITY: Low ? ? ?GOALS: ?Goals reviewed with patient? Yes ? ?SHORT TERM GOALS: Target date: 03/06/2022 ? ?Patient will be I with initial HEP in order to progress with therapy. ?Baseline: HEP provided at evaluation ?Goal status: INITIAL ? ?2.  PT will review FOTO with patient by 3rd visit in order to understand expected progress and outcome with therapy. ?Baseline: FOTO assessed at evaluation ?Goal status: INITIAL ? ?LONG TERM GOALS: Target date: 03/27/2022 ? ?Patient will be I with final HEP to maintain progress from PT. ?Baseline: HEP provided at evaluation ?Goal status: INITIAL ? ?2.  Patient will report >/= 58% status on FOTO to indicate improved functional ability. ?Baseline: 49% functional status ?Goal status: INITIAL ? ?3.  Patient will demonstrate ankle DF AROM >/= 8 deg  in order to improve gait and reduce pain with activity ?Baseline: left -2 deg, right 0 deg ?Goal status: INITIAL ? ?4.  Patient will exhibit ankle strength 5/5 MMT in order to improve standing and

## 2022-02-13 NOTE — Patient Instructions (Signed)
Access Code: [email or web address] ?URL: [email or web address] ?Date: 02/13/2022 ?Prepared by: Hilda Blades ? ?Exercises ?- Seated Plantar Fascia Stretch  - 1 x daily - 3 reps - 30 seconds hold ?- Seated Figure 4 Ankle Inversion with Resistance  - 1 x daily - 2-3 sets - 15 reps ?- Calf Mobilization with Small Ball  - 1 x daily - 10-15 reps - 2-3 minutes hold ?- Gastroc Stretch on Wall  - 1 x daily - 3 reps - 30 seconds hold ?- Standing Eccentric Heel Raise  - 1 x daily - 2-3 sets - 10 reps ?

## 2022-03-05 ENCOUNTER — Other Ambulatory Visit (HOSPITAL_COMMUNITY): Payer: Self-pay

## 2022-03-07 ENCOUNTER — Other Ambulatory Visit (HOSPITAL_COMMUNITY): Payer: Self-pay

## 2022-03-11 ENCOUNTER — Other Ambulatory Visit (HOSPITAL_COMMUNITY): Payer: Self-pay

## 2022-03-12 ENCOUNTER — Ambulatory Visit: Payer: 59 | Admitting: Physical Therapy

## 2022-03-12 ENCOUNTER — Encounter: Payer: Self-pay | Admitting: Physical Therapy

## 2022-03-12 DIAGNOSIS — M79671 Pain in right foot: Secondary | ICD-10-CM | POA: Diagnosis not present

## 2022-03-12 DIAGNOSIS — M6281 Muscle weakness (generalized): Secondary | ICD-10-CM | POA: Diagnosis not present

## 2022-03-12 DIAGNOSIS — M79672 Pain in left foot: Secondary | ICD-10-CM

## 2022-03-12 NOTE — Therapy (Signed)
OUTPATIENT PHYSICAL THERAPY TREATMENT NOTE   Patient Name: Daniel HASSING, MD MRN: [id number] DOB:03/05/1961, 61 y.o., male Today's Date: 03/12/2022   END OF SESSION:   PT End of Session - 03/12/22 1556     Visit Number 2    Number of Visits 13    Date for PT Re-Evaluation 03/27/22    Authorization Type [place] [employer]    PT Start Time 1415    PT Stop Time 9371    PT Time Calculation (min) 43 min    Activity Tolerance Patient tolerated treatment well    Behavior During Therapy WFL for tasks assessed/performed             Past Medical History:  Diagnosis Date   Anxiety    Aortic atherosclerosis (HCC)    BPH (benign prostatic hyperplasia)    Chronic pain syndrome    neck and low back   DDD (degenerative disc disease), cervical    Diverticulosis    DJD (degenerative joint disease)    Ganglion cyst    12 mm , right posterior knee   Hyperlipidemia    Internal hemorrhoids    Migraines    OA (osteoarthritis)    knees, hands, right shoulder   Positive QuantiFERON-TB Gold test    Shoulder pain    Tear of right rotator cuff    Type 2 diabetes mellitus ([place])    last A1c 5.4 on 04-13-2018 in epic (followed by pcp)   Wears glasses    Past Surgical History:  Procedure Laterality Date   ANAL FISSURE REPAIR     ARTHOSCOPIC ROTAOR CUFF REPAIR Right 06/15/2018   Procedure: RIGHT SHOULDER ARTHROSCOPY, DEBRIDEMENT, SUBACROMIAL DECOMPRESSION, DISTAL CLAVICLE RESECTION, ROTATOR CUFF REPAIR;  Surgeon: [name], MD;  Location: [facility];  Service: Orthopedics;  Laterality: Right;   COLONOSCOPY  05-26-2017   dr Carrington Clamp SIGMOIDOSCOPY N/A 11/05/2021   Procedure: FLEXIBLE SIGMOIDOSCOPY;  Surgeon: Milus Banister, MD;  Location: [facility];  Service: Endoscopy;  Laterality: N/A;   Hip Resurface  2006   KNEE ARTHROSCOPY W/ MENISCAL REPAIR Bilateral right 1993; left 2010   MASS EXCISION Right 04/21/2018   Procedure: EXCISION RIGHT THIGH SOFT TISSUE MASS;   Surgeon: [name], MD;  Location: [facility];  Service: Orthopedics;  Laterality: Right;   SEPTOPLASTY     SHOULDER ARTHROSCOPY WITH ROTATOR CUFF REPAIR Left    SLAP Tear Repair  2008   TONSILLECTOMY     TOTAL KNEE ARTHROPLASTY Left 12/08/2016   Procedure: LEFT TOTAL KNEE ARTHROPLASTY;  Surgeon: [name], MD;  Location: [facility];  Service: Orthopedics;  Laterality: Left;   TOTAL KNEE ARTHROPLASTY WITH REVISION COMPONENTS Left 04/21/2018   Procedure: Left knee polyethylene exchange ;  Surgeon: [name], MD;  Location: [facility];  Service: Orthopedics;  Laterality: Left;   Patient Active Problem List   Diagnosis Date Noted   Rectal bleed 11/10/2021   BRBPR (bright red blood per rectum) 11/09/2021   Controlled type 2 diabetes mellitus without complication, without long-term current use of insulin ([place]) 11/09/2021   Hyperlipidemia 11/09/2021   Chronic musculoskeletal pain 11/09/2021   Oropharyngeal candidiasis 11/09/2021   Hoarse voice quality 11/09/2021   Near syncope 11/09/2021   Acute lower GI bleeding 11/08/2021   Right shoulder injury 06/15/2018   S/P arthroscopy of right shoulder 06/15/2018   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   OA (osteoarthritis) of knee 12/08/2016   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 without status  migrainosus 12/17/2015   Cervical facet joint syndrome 12/17/2015   Chronic bilateral low back pain without sciatica 12/17/2015   Primary osteoarthritis of left knee 12/17/2015    REFERRING DIAG: Wylene Simmer, MD  THERAPY DIAG:  Pain in left foot  Pain in right foot  Muscle weakness (generalized)  Rationale for Evaluation and Treatment Rehabilitation  PERTINENT HISTORY:  DM II, BMI, chronic pain  PRECAUTIONS: N/A  SUBJECTIVE: "I saw the MD he said I could get my R foot treated as well, the L is getting better the R is more sore today."  PAIN:  Are you having pain? Yes: NPRS scale: R 5/10 and L 2/10 Pain location:  Bilateral foot, arch (Right > left) Pain description:  Intermittent, stiff and burning Aggravating factors: Pain worse first thing in the morning and then standing/walking extended periods on hard floors Relieving factors: Stretching, ice, tennis ball massage   OBJECTIVE: (objective measures completed at initial evaluation unless otherwise dated)   OBJECTIVE:  PATIENT SURVEYS:  FOTO 49% functional status  COGNITION: Overall cognitive status: Within functional limits for tasks assessed     SENSATION: WFL  MUSCLE LENGTH: Calf flexibility limitation  POSTURE:  Bilateral pes planus  PALPATION: Tender with palpable trigger points bilateral calf region  LE ROM:  Active ROM Right 02/13/2022 Left 02/13/2022  Ankle dorsiflexion 0 -2  Ankle plantarflexion 50 50  Ankle inversion 20 25  Ankle eversion 10 10   LE MMT:  MMT Right 02/13/2022 Left 02/13/2022  Hip flexion 5 5  Hip abduction 4 4  Knee flexion 5 5  Knee extension 5 5  Ankle dorsiflexion 5 5  Ankle plantarflexion 4- 4-  Ankle inversion 4 4  Ankle eversion 4 4   FUNCTIONAL TESTS:  SLS < 10 sec bilaterally  GAIT: Assistive device utilized: None Level of assistance: Complete Independence   TODAY'S TREATMENT: [facility] Adult PT Treatment:                                                DATE: 03/12/2022 Therapeutic Exercise: Gastroc/ soleus stretch 2 x 30 sec  Heel raise with ball squeeze 2 x 15 Manual Therapy: IASTM along the gastroc/ soleus X-frction along the medial calcaneal tubercle.  Active release   Trigger Point Dry-Needling  Treatment instructions: Expect mild to moderate muscle soreness. S/S of pneumothorax if dry needled over a lung field, and to seek immediate medical attention should they occur. Patient verbalized understanding of these instructions and education.  Patient Consent Given: Yes Education handout provided: Yes Muscles treated: bil gastroc/ soleus Electrical stimulation performed:  Yes Parameters: N/A Treatment response/outcome: twitch response/ lengthening.   *Performed at evaluation Seated figure-4 plantar fascia stretch Seated figure-4 banded ankle inversion with green x 15 each Longsitting SMFR using tennis ball for calf with active release Standing calf stretch at wall 2 x 30 sec each Eccentric heel raise x 10 each   PATIENT EDUCATION:  Education details: Exam findings, POC, HEP, dry needling future appointments Person educated: Patient Education method: Explanation, Demonstration, Tactile cues, Verbal cues, and Handouts Education comprehension: verbalized understanding, returned demonstration, verbal cues required, tactile cues required, and needs further education  HOME EXERCISE PROGRAM: Access Code: [email or web address] URL: [email or web address] Date: 03/12/2022 Prepared by: Starr Lake  Exercises - Seated Plantar Fascia Stretch  - 1 x daily - 3 reps - 30 seconds hold -  Seated Figure 4 Ankle Inversion with Resistance  - 1 x daily - 2-3 sets - 15 reps - Calf Mobilization with Small Ball  - 1 x daily - 10-15 reps - 2-3 minutes hold - Gastroc Stretch on Wall  - 1 x daily - 3 reps - 30 seconds hold - Standing Eccentric Heel Raise  - 1 x daily - 2-3 sets - 10 reps - Standing Heel Raises  - 1 x daily - 7 x weekly - 3 sets - 20 reps   ASSESSMENT: CLINICAL IMPRESSION: Pt arrives to session reporting consistency with his HEP and notes the L foot is getting better but the  Rfoot has been bothering him more today. Educated and consent was given for TPDN which he responded well with followed with IASTM and active release techniques. Continued working on stretching and strengthening of the posterior tib to promote PF support. End of session he noted relief of pain and improvement with walking/ standing.   OBJECTIVE IMPAIRMENTS decreased balance, decreased ROM, decreased strength, impaired flexibility, and pain.   ACTIVITY LIMITATIONS community  activity and occupation.   PERSONAL FACTORS Fitness, Past/current experiences, and Time since onset of injury/illness/exacerbation are also affecting patient's functional outcome.    REHAB POTENTIAL: Good  CLINICAL DECISION MAKING: Stable/uncomplicated  EVALUATION COMPLEXITY: Low   GOALS: Goals reviewed with patient? Yes  SHORT TERM GOALS: Target date: 03/06/2022  Patient will be I with initial HEP in order to progress with therapy. Baseline: HEP provided at evaluation Goal status: INITIAL  2.  PT will review FOTO with patient by 3rd visit in order to understand expected progress and outcome with therapy. Baseline: FOTO assessed at evaluation Goal status: INITIAL  LONG TERM GOALS: Target date: 03/27/2022  Patient will be I with final HEP to maintain progress from PT. Baseline: HEP provided at evaluation Goal status: INITIAL  2.  Patient will report >/= 58% status on FOTO to indicate improved functional ability. Baseline: 49% functional status Goal status: INITIAL  3.  Patient will demonstrate ankle DF AROM >/= 8 deg in order to improve gait and reduce pain with activity Baseline: left -2 deg, right 0 deg Goal status: INITIAL  4.  Patient will exhibit ankle strength 5/5 MMT in order to improve standing and walking tolerance while at work Baseline: PF strength grossly 4-/5 MMT Goal status: INITIAL  3.  Patient will report pain level </= 2-3/10 at end of work day to reduce limping and improve functional mobility. Baseline: 9-10 pain at end of work day Goal status: INITIAL  PLAN: PT FREQUENCY: 1-2x/week  PT DURATION: 6 weeks  PLANNED INTERVENTIONS: Therapeutic exercises, Therapeutic activity, Neuromuscular re-education, Balance training, Gait training, Patient/Family education, Joint manipulation, Joint mobilization, Aquatic Therapy, Dry Needling, Taping, Ionotophoresis '4mg'$ /ml Dexamethasone, and Manual therapy  PLAN FOR NEXT SESSION: Review HEP and progress PRN,  manual/dry needling for calf/foot, calf stretching, progress calf strength and arch control, load plantar fascia  Chevi Lim PT, DPT, LAT, ATC  03/12/22  4:00 PM

## 2022-03-13 ENCOUNTER — Other Ambulatory Visit (HOSPITAL_COMMUNITY): Payer: Self-pay

## 2022-03-13 MED ORDER — LIDOCAINE 4 % EX CREA
1.0000 "application " | TOPICAL_CREAM | CUTANEOUS | 5 refills | Status: DC
  Filled 2022-03-13: qty 90, 45d supply, fill #0
  Filled 2022-05-20: qty 90, 45d supply, fill #1

## 2022-03-14 ENCOUNTER — Other Ambulatory Visit (HOSPITAL_COMMUNITY): Payer: Self-pay

## 2022-03-17 ENCOUNTER — Other Ambulatory Visit (HOSPITAL_COMMUNITY): Payer: Self-pay

## 2022-03-17 ENCOUNTER — Ambulatory Visit: Payer: 59 | Admitting: Physical Therapy

## 2022-03-17 DIAGNOSIS — M9901 Segmental and somatic dysfunction of cervical region: Secondary | ICD-10-CM | POA: Diagnosis not present

## 2022-03-17 DIAGNOSIS — M9902 Segmental and somatic dysfunction of thoracic region: Secondary | ICD-10-CM | POA: Diagnosis not present

## 2022-03-17 DIAGNOSIS — M9904 Segmental and somatic dysfunction of sacral region: Secondary | ICD-10-CM | POA: Diagnosis not present

## 2022-03-17 DIAGNOSIS — M9903 Segmental and somatic dysfunction of lumbar region: Secondary | ICD-10-CM | POA: Diagnosis not present

## 2022-03-17 MED ORDER — FLUCONAZOLE 100 MG PO TABS
200.0000 mg | ORAL_TABLET | Freq: Every day | ORAL | 3 refills | Status: DC
  Filled 2022-03-17: qty 16, 14d supply, fill #0
  Filled 2022-03-31: qty 16, 14d supply, fill #1
  Filled 2022-06-09: qty 16, 15d supply, fill #2
  Filled 2022-07-09: qty 16, 15d supply, fill #3

## 2022-03-17 NOTE — Therapy (Incomplete)
OUTPATIENT PHYSICAL THERAPY TREATMENT NOTE   Patient Name: Daniel VESELKA, MD MRN: [id number] DOB:03-May-1961, 61 y.o., male Today's Date: 03/17/2022   END OF SESSION:     Past Medical History:  Diagnosis Date   Anxiety    Aortic atherosclerosis (HCC)    BPH (benign prostatic hyperplasia)    Chronic pain syndrome    neck and low back   DDD (degenerative disc disease), cervical    Diverticulosis    DJD (degenerative joint disease)    Ganglion cyst    12 mm , right posterior knee   Hyperlipidemia    Internal hemorrhoids    Migraines    OA (osteoarthritis)    knees, hands, right shoulder   Positive QuantiFERON-TB Gold test    Shoulder pain    Tear of right rotator cuff    Type 2 diabetes mellitus ([place])    last A1c 5.4 on 04-13-2018 in epic (followed by pcp)   Wears glasses    Past Surgical History:  Procedure Laterality Date   ANAL FISSURE REPAIR     ARTHOSCOPIC ROTAOR CUFF REPAIR Right 06/15/2018   Procedure: RIGHT SHOULDER ARTHROSCOPY, DEBRIDEMENT, SUBACROMIAL DECOMPRESSION, DISTAL CLAVICLE RESECTION, ROTATOR CUFF REPAIR;  Surgeon: [name], MD;  Location: [facility];  Service: Orthopedics;  Laterality: Right;   COLONOSCOPY  05-26-2017   dr Carrington Clamp SIGMOIDOSCOPY N/A 11/05/2021   Procedure: FLEXIBLE SIGMOIDOSCOPY;  Surgeon: Milus Banister, MD;  Location: [facility];  Service: Endoscopy;  Laterality: N/A;   Hip Resurface  2006   KNEE ARTHROSCOPY W/ MENISCAL REPAIR Bilateral right 1993; left 2010   MASS EXCISION Right 04/21/2018   Procedure: EXCISION RIGHT THIGH SOFT TISSUE MASS;  Surgeon: [name], MD;  Location: [facility];  Service: Orthopedics;  Laterality: Right;   SEPTOPLASTY     SHOULDER ARTHROSCOPY WITH ROTATOR CUFF REPAIR Left    SLAP Tear Repair  2008   TONSILLECTOMY     TOTAL KNEE ARTHROPLASTY Left 12/08/2016   Procedure: LEFT TOTAL KNEE ARTHROPLASTY;  Surgeon: [name], MD;  Location: [facility];  Service: Orthopedics;   Laterality: Left;   TOTAL KNEE ARTHROPLASTY WITH REVISION COMPONENTS Left 04/21/2018   Procedure: Left knee polyethylene exchange ;  Surgeon: [name], MD;  Location: [facility];  Service: Orthopedics;  Laterality: Left;   Patient Active Problem List   Diagnosis Date Noted   Rectal bleed 11/10/2021   BRBPR (bright red blood per rectum) 11/09/2021   Controlled type 2 diabetes mellitus without complication, without long-term current use of insulin ([place]) 11/09/2021   Hyperlipidemia 11/09/2021   Chronic musculoskeletal pain 11/09/2021   Oropharyngeal candidiasis 11/09/2021   Hoarse voice quality 11/09/2021   Near syncope 11/09/2021   Acute lower GI bleeding 11/08/2021   Right shoulder injury 06/15/2018   S/P arthroscopy of right shoulder 06/15/2018   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  Rationale for Evaluation and Treatment Rehabilitation  PERTINENT HISTORY:  DM II, BMI, chronic pain  PRECAUTIONS: N/A  SUBJECTIVE: ***  PAIN:  Are you having pain? Yes: NPRS scale: R 5/10 and L 2/10 Pain location: Bilateral foot, arch (Right > left) Pain description:  Intermittent, stiff and burning Aggravating factors: Pain worse first thing in the morning and then standing/walking extended periods on hard floors Relieving factors: Stretching, ice, tennis ball massage   OBJECTIVE: (objective measures completed at initial evaluation unless otherwise dated)   OBJECTIVE:  PATIENT SURVEYS:  FOTO 49% functional status  COGNITION: Overall cognitive status: Within functional limits for tasks  assessed     SENSATION: WFL  MUSCLE LENGTH: Calf flexibility limitation  POSTURE:  Bilateral pes planus  PALPATION: Tender with palpable trigger points bilateral calf region  LE ROM:  Active ROM Right 02/13/2022 Left 02/13/2022  Ankle dorsiflexion 0 -2  Ankle plantarflexion 50 50  Ankle inversion 20 25  Ankle eversion 10 10   LE MMT:  MMT Right 02/13/2022 Left 02/13/2022  Hip flexion 5 5  Hip abduction 4 4  Knee flexion 5 5  Knee extension 5 5  Ankle dorsiflexion 5 5  Ankle plantarflexion 4- 4-  Ankle inversion 4 4  Ankle eversion 4 4   FUNCTIONAL TESTS:  SLS < 10 sec bilaterally  GAIT: Assistive device utilized: None Level of assistance: Complete Independence   TODAY'S TREATMENT: [facility] Adult PT Treatment:                                                DATE: 03/17/2022 Therapeutic Exercise: *** Manual Therapy: *** Neuromuscular re-ed: *** Therapeutic Activity: *** Modalities: *** Self Care: ***   [facility] Adult PT Treatment:                                                DATE: 03/12/2022 Therapeutic Exercise: Gastroc/ soleus stretch 2 x 30 sec  Heel raise with ball squeeze 2 x 15 Manual Therapy: IASTM along the gastroc/ soleus X-frction along the medial calcaneal tubercle.  Active release   Trigger Point Dry-Needling  Treatment instructions: Expect mild to moderate muscle soreness. S/S of pneumothorax if dry needled over a lung field, and to seek immediate medical attention should they occur. Patient verbalized understanding of these instructions and education.  Patient Consent Given: Yes Education handout provided: Yes Muscles treated: bil gastroc/ soleus Electrical stimulation performed: Yes Parameters: N/A Treatment response/outcome: twitch response/ lengthening.   *Performed at evaluation Seated figure-4 plantar fascia stretch Seated figure-4 banded ankle inversion with green x 15 each Longsitting SMFR using tennis ball for calf with active  release Standing calf stretch at wall 2 x 30 sec each Eccentric heel raise x 10 each   PATIENT EDUCATION:  Education details: Exam findings, POC, HEP, dry needling future appointments Person educated: Patient Education method: Explanation, Demonstration, Tactile cues, Verbal cues, and Handouts Education comprehension: verbalized understanding, returned demonstration, verbal cues required, tactile cues required, and needs further education  HOME EXERCISE PROGRAM: Access Code: [email or web address] URL: [email or web address] Date: 03/12/2022 Prepared by: Starr Lake  Exercises - Seated Plantar Fascia Stretch  - 1 x daily - 3 reps - 30 seconds hold - Seated Figure 4 Ankle Inversion with Resistance  - 1 x daily - 2-3 sets - 15 reps - Calf Mobilization with Small Ball  - 1 x daily - 10-15 reps - 2-3 minutes hold - Gastroc Stretch on Wall  - 1 x daily - 3 reps - 30 seconds hold -  Standing Eccentric Heel Raise  - 1 x daily - 2-3 sets - 10 reps - Standing Heel Raises  - 1 x daily - 7 x weekly - 3 sets - 20 reps   ASSESSMENT: CLINICAL IMPRESSION: ***  OBJECTIVE IMPAIRMENTS decreased balance, decreased ROM, decreased strength, impaired flexibility, and pain.   ACTIVITY LIMITATIONS community activity and occupation.   PERSONAL FACTORS Fitness, Past/current experiences, and Time since onset of injury/illness/exacerbation are also affecting patient's functional outcome.    REHAB POTENTIAL: Good  CLINICAL DECISION MAKING: Stable/uncomplicated  EVALUATION COMPLEXITY: Low   GOALS: Goals reviewed with patient? Yes  SHORT TERM GOALS: Target date: 03/06/2022  Patient will be I with initial HEP in order to progress with therapy. Baseline: HEP provided at evaluation Goal status: INITIAL  2.  PT will review FOTO with patient by 3rd visit in order to understand expected progress and outcome with therapy. Baseline: FOTO assessed at evaluation Goal status: INITIAL  LONG TERM  GOALS: Target date: 03/27/2022  Patient will be I with final HEP to maintain progress from PT. Baseline: HEP provided at evaluation Goal status: INITIAL  2.  Patient will report >/= 58% status on FOTO to indicate improved functional ability. Baseline: 49% functional status Goal status: INITIAL  3.  Patient will demonstrate ankle DF AROM >/= 8 deg in order to improve gait and reduce pain with activity Baseline: left -2 deg, right 0 deg Goal status: INITIAL  4.  Patient will exhibit ankle strength 5/5 MMT in order to improve standing and walking tolerance while at work Baseline: PF strength grossly 4-/5 MMT Goal status: INITIAL  3.  Patient will report pain level </= 2-3/10 at end of work day to reduce limping and improve functional mobility. Baseline: 9-10 pain at end of work day Goal status: INITIAL  PLAN: PT FREQUENCY: 1-2x/week  PT DURATION: 6 weeks  PLANNED INTERVENTIONS: Therapeutic exercises, Therapeutic activity, Neuromuscular re-education, Balance training, Gait training, Patient/Family education, Joint manipulation, Joint mobilization, Aquatic Therapy, Dry Needling, Taping, Ionotophoresis '4mg'$ /ml Dexamethasone, and Manual therapy  PLAN FOR NEXT SESSION: Review HEP and progress PRN, manual/dry needling for calf/foot, calf stretching, progress calf strength and arch control, load plantar fascia  Odile Veloso PT, DPT, LAT, ATC  03/17/22  9:16 AM

## 2022-03-19 ENCOUNTER — Other Ambulatory Visit (HOSPITAL_COMMUNITY): Payer: Self-pay

## 2022-03-19 MED ORDER — LIDOCAINE 4 % EX CREA: TOPICAL_CREAM | Freq: Four times a day (QID) | CUTANEOUS | 1 refills | Status: DC | PRN

## 2022-03-20 ENCOUNTER — Other Ambulatory Visit (HOSPITAL_COMMUNITY): Payer: Self-pay

## 2022-03-20 MED ORDER — LIDOCAINE 4 % EX CREA
1.0000 "application " | TOPICAL_CREAM | Freq: Four times a day (QID) | CUTANEOUS | 1 refills | Status: DC
  Filled 2022-03-20: qty 30, 12d supply, fill #0

## 2022-03-27 ENCOUNTER — Ambulatory Visit: Payer: 59 | Admitting: Physical Therapy

## 2022-03-27 NOTE — Therapy (Incomplete)
OUTPATIENT PHYSICAL THERAPY TREATMENT NOTE   Patient Name: Daniel PETRUCELLI, MD MRN: [id number] DOB:1961-05-06, 61 y.o., male Today's Date: 03/27/2022   END OF SESSION:     Past Medical History:  Diagnosis Date   Anxiety    Aortic atherosclerosis (HCC)    BPH (benign prostatic hyperplasia)    Chronic pain syndrome    neck and low back   DDD (degenerative disc disease), cervical    Diverticulosis    DJD (degenerative joint disease)    Ganglion cyst    12 mm , right posterior knee   Hyperlipidemia    Internal hemorrhoids    Migraines    OA (osteoarthritis)    knees, hands, right shoulder   Positive QuantiFERON-TB Gold test    Shoulder pain    Tear of right rotator cuff    Type 2 diabetes mellitus ([place])    last A1c 5.4 on 04-13-2018 in epic (followed by pcp)   Wears glasses    Past Surgical History:  Procedure Laterality Date   ANAL FISSURE REPAIR     ARTHOSCOPIC ROTAOR CUFF REPAIR Right 06/15/2018   Procedure: RIGHT SHOULDER ARTHROSCOPY, DEBRIDEMENT, SUBACROMIAL DECOMPRESSION, DISTAL CLAVICLE RESECTION, ROTATOR CUFF REPAIR;  Surgeon: [name], MD;  Location: [place];  Service: Orthopedics;  Laterality: Right;   COLONOSCOPY  05-26-2017   dr Carrington Clamp SIGMOIDOSCOPY N/A 11/05/2021   Procedure: FLEXIBLE SIGMOIDOSCOPY;  Surgeon: Milus Banister, MD;  Location: [facility];  Service: Endoscopy;  Laterality: N/A;   Hip Resurface  2006   KNEE ARTHROSCOPY W/ MENISCAL REPAIR Bilateral right 1993; left 2010   MASS EXCISION Right 04/21/2018   Procedure: EXCISION RIGHT THIGH SOFT TISSUE MASS;  Surgeon: [name], MD;  Location: [facility];  Service: Orthopedics;  Laterality: Right;   SEPTOPLASTY     SHOULDER ARTHROSCOPY WITH ROTATOR CUFF REPAIR Left    SLAP Tear Repair  2008   TONSILLECTOMY     TOTAL KNEE ARTHROPLASTY Left 12/08/2016   Procedure: LEFT TOTAL KNEE ARTHROPLASTY;  Surgeon: [name], MD;  Location: [facility];  Service: Orthopedics;   Laterality: Left;   TOTAL KNEE ARTHROPLASTY WITH REVISION COMPONENTS Left 04/21/2018   Procedure: Left knee polyethylene exchange ;  Surgeon: [name], MD;  Location: [facility];  Service: Orthopedics;  Laterality: Left;   Patient Active Problem List   Diagnosis Date Noted   Rectal bleed 11/10/2021   BRBPR (bright red blood per rectum) 11/09/2021   Controlled type 2 diabetes mellitus without complication, without long-term current use of insulin ([place]) 11/09/2021   Hyperlipidemia 11/09/2021   Chronic musculoskeletal pain 11/09/2021   Oropharyngeal candidiasis 11/09/2021   Hoarse voice quality 11/09/2021   Near syncope 11/09/2021   Acute lower GI bleeding 11/08/2021   Right shoulder injury 06/15/2018   S/P arthroscopy of right shoulder 06/15/2018   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  Rationale for Evaluation and Treatment Rehabilitation  PERTINENT HISTORY:  DM II, BMI, chronic pain  PRECAUTIONS: N/A  SUBJECTIVE: ***  PAIN:  Are you having pain? Yes: NPRS scale: R 5/10 and L 2/10 Pain location: Bilateral foot, arch (Right > left) Pain description:  Intermittent, stiff and burning Aggravating factors: Pain worse first thing in the morning and then standing/walking extended periods on hard floors Relieving factors: Stretching, ice, tennis ball massage   OBJECTIVE: (objective measures completed at initial evaluation unless otherwise dated)   OBJECTIVE:  PATIENT SURVEYS:  FOTO 49% functional status  COGNITION: Overall cognitive status: Within functional limits for tasks  assessed     SENSATION: WFL  MUSCLE LENGTH: Calf flexibility limitation  POSTURE:  Bilateral pes planus  PALPATION: Tender with palpable trigger points bilateral calf region  LE ROM:  Active ROM Right 02/13/2022 Left 02/13/2022  Ankle dorsiflexion 0 -2  Ankle plantarflexion 50 50  Ankle inversion 20 25  Ankle eversion 10 10   LE MMT:  MMT Right 02/13/2022 Left 02/13/2022  Hip flexion 5 5  Hip abduction 4 4  Knee flexion 5 5  Knee extension 5 5  Ankle dorsiflexion 5 5  Ankle plantarflexion 4- 4-  Ankle inversion 4 4  Ankle eversion 4 4   FUNCTIONAL TESTS:  SLS < 10 sec bilaterally  GAIT: Assistive device utilized: None Level of assistance: Complete Independence   TODAY'S TREATMENT: [facility] Adult PT Treatment:                                                DATE: 03/17/2022 Therapeutic Exercise: *** Manual Therapy: *** Neuromuscular re-ed: *** Therapeutic Activity: *** Modalities: *** Self Care: ***   [facility] Adult PT Treatment:                                                DATE: 03/12/2022 Therapeutic Exercise: Gastroc/ soleus stretch 2 x 30 sec  Heel raise with ball squeeze 2 x 15 Manual Therapy: IASTM along the gastroc/ soleus X-frction along the medial calcaneal tubercle.  Active release   Trigger Point Dry-Needling  Treatment instructions: Expect mild to moderate muscle soreness. S/S of pneumothorax if dry needled over a lung field, and to seek immediate medical attention should they occur. Patient verbalized understanding of these instructions and education.  Patient Consent Given: Yes Education handout provided: Yes Muscles treated: bil gastroc/ soleus Electrical stimulation performed: Yes Parameters: N/A Treatment response/outcome: twitch response/ lengthening.   *Performed at evaluation Seated figure-4 plantar fascia stretch Seated figure-4 banded ankle inversion with green x 15 each Longsitting SMFR using tennis ball for calf with active  release Standing calf stretch at wall 2 x 30 sec each Eccentric heel raise x 10 each   PATIENT EDUCATION:  Education details: Exam findings, POC, HEP, dry needling future appointments Person educated: Patient Education method: Explanation, Demonstration, Tactile cues, Verbal cues, and Handouts Education comprehension: verbalized understanding, returned demonstration, verbal cues required, tactile cues required, and needs further education  HOME EXERCISE PROGRAM: Access Code: [email or web address] URL: [email or web address] Date: 03/12/2022 Prepared by: Starr Lake  Exercises - Seated Plantar Fascia Stretch  - 1 x daily - 3 reps - 30 seconds hold - Seated Figure 4 Ankle Inversion with Resistance  - 1 x daily - 2-3 sets - 15 reps - Calf Mobilization with Small Ball  - 1 x daily - 10-15 reps - 2-3 minutes hold - Gastroc Stretch on Wall  - 1 x daily - 3 reps - 30 seconds hold -  Standing Eccentric Heel Raise  - 1 x daily - 2-3 sets - 10 reps - Standing Heel Raises  - 1 x daily - 7 x weekly - 3 sets - 20 reps   ASSESSMENT: CLINICAL IMPRESSION: ***  OBJECTIVE IMPAIRMENTS decreased balance, decreased ROM, decreased strength, impaired flexibility, and pain.   ACTIVITY LIMITATIONS community activity and occupation.   PERSONAL FACTORS Fitness, Past/current experiences, and Time since onset of injury/illness/exacerbation are also affecting patient's functional outcome.    REHAB POTENTIAL: Good  CLINICAL DECISION MAKING: Stable/uncomplicated  EVALUATION COMPLEXITY: Low   GOALS: Goals reviewed with patient? Yes  SHORT TERM GOALS: Target date: 03/06/2022  Patient will be I with initial HEP in order to progress with therapy. Baseline: HEP provided at evaluation Goal status: INITIAL  2.  PT will review FOTO with patient by 3rd visit in order to understand expected progress and outcome with therapy. Baseline: FOTO assessed at evaluation Goal status: INITIAL  LONG TERM  GOALS: Target date: 03/27/2022  Patient will be I with final HEP to maintain progress from PT. Baseline: HEP provided at evaluation Goal status: INITIAL  2.  Patient will report >/= 58% status on FOTO to indicate improved functional ability. Baseline: 49% functional status Goal status: INITIAL  3.  Patient will demonstrate ankle DF AROM >/= 8 deg in order to improve gait and reduce pain with activity Baseline: left -2 deg, right 0 deg Goal status: INITIAL  4.  Patient will exhibit ankle strength 5/5 MMT in order to improve standing and walking tolerance while at work Baseline: PF strength grossly 4-/5 MMT Goal status: INITIAL  3.  Patient will report pain level </= 2-3/10 at end of work day to reduce limping and improve functional mobility. Baseline: 9-10 pain at end of work day Goal status: INITIAL  PLAN: PT FREQUENCY: 1-2x/week  PT DURATION: 6 weeks  PLANNED INTERVENTIONS: Therapeutic exercises, Therapeutic activity, Neuromuscular re-education, Balance training, Gait training, Patient/Family education, Joint manipulation, Joint mobilization, Aquatic Therapy, Dry Needling, Taping, Ionotophoresis '4mg'$ /ml Dexamethasone, and Manual therapy  PLAN FOR NEXT SESSION: Review HEP and progress PRN, manual/dry needling for calf/foot, calf stretching, progress calf strength and arch control, load plantar fascia  Olivia Royse PT, DPT, LAT, ATC  03/27/22  8:13 AM

## 2022-03-31 ENCOUNTER — Other Ambulatory Visit: Payer: Self-pay | Admitting: Internal Medicine

## 2022-03-31 ENCOUNTER — Other Ambulatory Visit (HOSPITAL_COMMUNITY): Payer: Self-pay

## 2022-03-31 ENCOUNTER — Other Ambulatory Visit: Payer: Self-pay

## 2022-03-31 MED ORDER — TRAMADOL HCL 50 MG PO TABS
50.0000 mg | ORAL_TABLET | Freq: Four times a day (QID) | ORAL | 0 refills | Status: DC | PRN
Start: 2022-03-31 — End: 2023-08-10
  Filled 2022-03-31: qty 240, 30d supply, fill #0

## 2022-03-31 MED ORDER — HYDROCORTISONE ACETATE 25 MG RE SUPP
25.0000 mg | Freq: Two times a day (BID) | RECTAL | 0 refills | Status: DC | PRN
  Filled 2022-03-31: qty 20, 10d supply, fill #0

## 2022-03-31 MED ORDER — METHOCARBAMOL 500 MG PO TABS
500.0000 mg | ORAL_TABLET | Freq: Three times a day (TID) | ORAL | 0 refills | Status: DC | PRN
  Filled 2022-03-31: qty 90, 30d supply, fill #0

## 2022-03-31 MED ORDER — DICYCLOMINE HCL 20 MG PO TABS
20.0000 mg | ORAL_TABLET | ORAL | 0 refills | Status: DC
Start: 2022-03-31 — End: 2022-05-20
  Filled 2022-03-31: qty 30, 5d supply, fill #0

## 2022-03-31 MED ORDER — OXYCODONE HCL 5 MG PO TABS
5.0000 mg | ORAL_TABLET | Freq: Three times a day (TID) | ORAL | 0 refills | Status: DC | PRN
Start: 2022-03-31 — End: 2022-06-09
  Filled 2022-03-31: qty 180, 30d supply, fill #0

## 2022-03-31 MED ORDER — GABAPENTIN 600 MG PO TABS
600.0000 mg | ORAL_TABLET | Freq: Three times a day (TID) | ORAL | 1 refills | Status: DC
Start: 2022-03-31 — End: 2023-02-27
  Filled 2022-10-28: qty 270, 90d supply, fill #0
  Filled 2023-01-29: qty 270, 90d supply, fill #1

## 2022-03-31 MED ORDER — COMBIVENT RESPIMAT 20-100 MCG/ACT IN AERS
2.0000 | INHALATION_SPRAY | Freq: Four times a day (QID) | RESPIRATORY_TRACT | 3 refills | Status: DC
  Filled 2022-03-31: qty 4, 15d supply, fill #0

## 2022-04-01 ENCOUNTER — Other Ambulatory Visit (HOSPITAL_COMMUNITY): Payer: Self-pay

## 2022-04-01 DIAGNOSIS — I2699 Other pulmonary embolism without acute cor pulmonale: Secondary | ICD-10-CM | POA: Diagnosis not present

## 2022-04-01 DIAGNOSIS — I8290 Acute embolism and thrombosis of unspecified vein: Secondary | ICD-10-CM | POA: Diagnosis not present

## 2022-04-01 MED ORDER — COMBIVENT RESPIMAT 20-100 MCG/ACT IN AERS
1.0000 | INHALATION_SPRAY | Freq: Four times a day (QID) | RESPIRATORY_TRACT | 3 refills | Status: DC | PRN
  Filled 2022-04-01 – 2022-07-09 (×3): qty 4, 30d supply, fill #0
  Filled 2022-10-28: qty 4, 30d supply, fill #1
  Filled 2023-01-29: qty 4, 30d supply, fill #2
  Filled 2023-02-25: qty 4, 30d supply, fill #3

## 2022-04-01 MED ORDER — GABAPENTIN 600 MG PO TABS
600.0000 mg | ORAL_TABLET | Freq: Three times a day (TID) | ORAL | 1 refills | Status: DC
Start: 2022-04-01 — End: 2023-07-24
  Filled 2022-04-01 (×3): qty 270, 90d supply, fill #0
  Filled 2022-07-09: qty 270, 90d supply, fill #1

## 2022-04-02 ENCOUNTER — Other Ambulatory Visit (HOSPITAL_COMMUNITY): Payer: Self-pay

## 2022-04-02 NOTE — Therapy (Signed)
OUTPATIENT PHYSICAL THERAPY TREATMENT NOTE / RE-EVALUATION   Patient Name: Daniel BARICH, MD MRN: [id number] DOB:1961-06-24, 61 y.o., male Today's Date: 04/03/2022   END OF SESSION:   PT End of Session - 04/03/22 1503     Visit Number 3    Number of Visits 15    Date for PT Re-Evaluation 05/29/22    Authorization Type [place] [employer]    PT Start Time 1502    PT Stop Time 1555    PT Time Calculation (min) 53 min    Activity Tolerance Patient tolerated treatment well              Past Medical History:  Diagnosis Date   Anxiety    Aortic atherosclerosis (HCC)    BPH (benign prostatic hyperplasia)    Chronic pain syndrome    neck and low back   DDD (degenerative disc disease), cervical    Diverticulosis    DJD (degenerative joint disease)    Ganglion cyst    12 mm , right posterior knee   Hyperlipidemia    Internal hemorrhoids    Migraines    OA (osteoarthritis)    knees, hands, right shoulder   Positive QuantiFERON-TB Gold test    Shoulder pain    Tear of right rotator cuff    Type 2 diabetes mellitus ([place])    last A1c 5.4 on 04-13-2018 in epic (followed by pcp)   Wears glasses    Past Surgical History:  Procedure Laterality Date   ANAL FISSURE REPAIR     ARTHOSCOPIC ROTAOR CUFF REPAIR Right 06/15/2018   Procedure: RIGHT SHOULDER ARTHROSCOPY, DEBRIDEMENT, SUBACROMIAL DECOMPRESSION, DISTAL CLAVICLE RESECTION, ROTATOR CUFF REPAIR;  Surgeon: [name], MD;  Location: [place];  Service: Orthopedics;  Laterality: Right;   COLONOSCOPY  05-26-2017   dr Carrington Clamp SIGMOIDOSCOPY N/A 11/05/2021   Procedure: FLEXIBLE SIGMOIDOSCOPY;  Surgeon: Milus Banister, MD;  Location: [facility];  Service: Endoscopy;  Laterality: N/A;   Hip Resurface  2006   KNEE ARTHROSCOPY W/ MENISCAL REPAIR Bilateral right 1993; left 2010   MASS EXCISION Right 04/21/2018   Procedure: EXCISION RIGHT THIGH SOFT TISSUE MASS;  Surgeon: [name], MD;  [facility];  Service: Orthopedics;  Laterality: Right;   SEPTOPLASTY     SHOULDER ARTHROSCOPY WITH ROTATOR CUFF REPAIR Left    SLAP Tear Repair  2008   TONSILLECTOMY     TOTAL KNEE ARTHROPLASTY Left 12/08/2016   Procedure: LEFT TOTAL KNEE ARTHROPLASTY;  Surgeon: [name], MD;  [facility];  Service: Orthopedics;  Laterality: Left;   TOTAL KNEE ARTHROPLASTY WITH REVISION COMPONENTS Left 04/21/2018   Procedure: Left knee polyethylene exchange ;  Surgeon: [name], MD;  [facility];  Service: Orthopedics;  Laterality: Left;   Patient Active Problem List   Diagnosis Date Noted   Rectal bleed 11/10/2021   BRBPR (bright red blood per rectum) 11/09/2021   Controlled type 2 diabetes mellitus without complication, without long-term current use of insulin ([place]) 11/09/2021   Hyperlipidemia 11/09/2021   Chronic musculoskeletal pain 11/09/2021   Oropharyngeal candidiasis 11/09/2021   Hoarse voice quality 11/09/2021   Near syncope 11/09/2021   Acute lower GI bleeding 11/08/2021   Right shoulder injury 06/15/2018   S/P arthroscopy of right shoulder 06/15/2018   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   OA (osteoarthritis) of knee 12/08/2016   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 without status migrainosus 12/17/2015   Cervical facet joint  syndrome 12/17/2015   Chronic bilateral low back pain without sciatica 12/17/2015   Primary osteoarthritis of left knee 12/17/2015    REFERRING DIAG: L plantar fascialitis - Wylene Simmer MD            R shoulder pain -    [name] MD  THERAPY DIAG:  Pain in left foot  Pain in right foot  Muscle weakness (generalized)  Chronic right shoulder pain  Cervicalgia  Rationale for Evaluation and Treatment Rehabilitation  PERTINENT HISTORY:  DM II, BMI, chronic pain  PRECAUTIONS: N/A  SUBJECTIVE: pt presents with updated script to include R shoulder to current POC. He reports the neck / shoulder pain has occurred after  falling and having rotator cuff surgery. He reports both shoulders are worsening with no specific MOI, and notes he is loosing strength and ROM in the L The L foot has improved a lot with no pain in the AM. The R foot has been the worst 4-/510 in the morning and was worse during the week he was working. He reports consisistency with his stretches and exercises which helps.   PAIN:  Are you having pain? Yes: NPRS scale: R 1/10 and L 0/10 Pain location: Bilateral foot, arch (Right > left) Pain description:  Intermittent, stiff and burning Aggravating factors: Pain worse first thing in the morning and then standing/walking extended periods on hard floors Relieving factors: Stretching, ice, tennis ball massage  Bil shoulder pain 6/10 on the R, and 1/10 on the L Descrption aching sore   OBJECTIVE: (objective measures completed at initial evaluation unless otherwise dated)   OBJECTIVE:  PATIENT SURVEYS:  FOTO 49% functional status  COGNITION: Overall cognitive status: Within functional limits for tasks assessed     SENSATION: WFL  MUSCLE LENGTH: Calf flexibility limitation  POSTURE:  Bilateral pes planus  PALPATION: Tender with palpable trigger points bilateral calf region  TTP along bil upper trap/ levator scapuluae, cervical paraspinals, sub-occiptals R>L and R pec major/ min at its mid to distal attachment.   CERVICAL ROM:   Active ROM A/PROM (deg) 04/03/2022  Flexion 40  Extension 30 R P!  Right lateral flexion 20  Left lateral flexion 40  Right rotation 58 P!  Left rotation 58   (Blank rows = not tested)  UE ROM:  Active ROM Right 04/03/2022 Left 04/03/2022  Shoulder flexion [facility]  Shoulder extension [facility]  Shoulder abduction 125 115  Shoulder adduction    Shoulder extension    Shoulder internal rotation    Shoulder external rotation [facility]  Elbow flexion    Elbow extension    Wrist flexion    Wrist extension    Wrist ulnar deviation    Wrist radial  deviation    Wrist pronation    Wrist supination     (Blank rows = not tested)  UE MMT:  MMT Right 04/03/2022 Left 04/03/2022  Shoulder flexion 4 P! 4  Shoulder extension 5 5  Shoulder abduction 4 P! 4  Shoulder adduction    Shoulder extension    Shoulder internal rotation 4+ 4+/5  Shoulder external rotation 4 P! 4  Middle trapezius    Lower trapezius    Elbow flexion    Elbow extension    Wrist flexion    Wrist extension    Wrist ulnar deviation    Wrist radial deviation    Wrist pronation    Wrist supination    Grip strength     (Blank rows =  not tested)   LE ROM:  Active ROM Right 02/13/2022 Left 02/13/2022 Right 04/03/2022 Left 04/03/2022  Ankle dorsiflexion 0 -2 3 5   Ankle plantarflexion 50 50 55 58  Ankle inversion 20 25 22 20   Ankle eversion 10 10 10 10    LE MMT:  MMT Right 02/13/2022 Left 02/13/2022 Right 04/03/2022 Left 04/03/2022  Hip flexion 5 5    Hip abduction 4 4    Knee flexion 5 5    Knee extension 5 5    Ankle dorsiflexion 5 5 5 5   Ankle plantarflexion 4- 4- 4 4+  Ankle inversion 4 4 4  4+  Ankle eversion 4 4 4  4+   FUNCTIONAL TESTS:  SLS < 10 sec bilaterally  GAIT: Assistive device utilized: None Level of assistance: Complete Independence   TODAY'S TREATMENT: [facility] Adult PT Treatment:                                                DATE: 03/17/2022 Therapeutic Exercise: Self R cervical gapping with overpressure using towel   Reivewed HEP and updated today for neck/ shoulders.  Manual Therapy: IASTM along the bil upper trap, levator scapuale, sub-occipitals, and R gastroc  Trigger Point Dry-Needling  Treatment instructions: Expect mild to moderate muscle soreness. S/S of pneumothorax if dry needled over a lung field, and to seek immediate medical attention should they occur. Patient verbalized understanding of these instructions and education.  Patient Consent Given: Yes Education handout provided: Previously provided Muscles treated: R  gastroc/ soleus, bil upper trap/ levator and cervical paraspinals at C 3& C6 bil Electrical stimulation performed: No Parameters: N/A Treatment response/outcome: twitch response noted.     [facility] Adult PT Treatment:                                                DATE: 03/12/2022 Therapeutic Exercise: Gastroc/ soleus stretch 2 x 30 sec  Heel raise with ball squeeze 2 x 15 Manual Therapy: IASTM along the gastroc/ soleus X-frction along the medial calcaneal tubercle.  Active release   Trigger Point Dry-Needling  Treatment instructions: Expect mild to moderate muscle soreness. S/S of pneumothorax if dry needled over a lung field, and to seek immediate medical attention should they occur. Patient verbalized understanding of these instructions and education.  Patient Consent Given: Yes Education handout provided: Yes Muscles treated: bil gastroc/ soleus Electrical stimulation performed: Yes Parameters: N/A Treatment response/outcome: twitch response/ lengthening.   *Performed at evaluation Seated figure-4 plantar fascia stretch Seated figure-4 banded ankle inversion with green x 15 each Longsitting SMFR using tennis ball for calf with active release Standing calf stretch at wall 2 x 30 sec each Eccentric heel raise x 10 each   PATIENT EDUCATION:  Education details: Exam findings, POC, HEP, dry needling future appointments Person educated: Patient Education method: Explanation, Demonstration, Tactile cues, Verbal cues, and Handouts Education comprehension: verbalized understanding, returned demonstration, verbal cues required, tactile cues required, and needs further education  HOME EXERCISE PROGRAM: Access Code: [email or web address] URL: [email or web address] Date: 03/12/2022 Prepared by: Starr Lake  Exercises - Seated Plantar Fascia Stretch  - 1 x daily - 3 reps - 30 seconds hold - Seated Figure 4 Ankle Inversion with Resistance  - 1 x  daily - 2-3 sets - 15 reps -  Calf Mobilization with Small Ball  - 1 x daily - 10-15 reps - 2-3 minutes hold - Gastroc Stretch on Wall  - 1 x daily - 3 reps - 30 seconds hold - Standing Eccentric Heel Raise  - 1 x daily - 2-3 sets - 10 reps - Standing Heel Raises  - 1 x daily - 7 x weekly - 3 sets - 20 reps   ASSESSMENT: CLINICAL IMPRESSION: Pt presents to physical therapy with updated script to include neck / R shoulder to his current POC. He demonstrates functional cervical ROM with limitations with R sidebending / rotation with pain. Shoulder AROM was [facility] except for abduction with deficit noted in the L compared bil. TTP along bil upper trap/ levator scapulae, cervical paraspinals, sub-occipitals R>L and R pec major/ min at its mid to distal attachment. He is making good progress with physical therapy for bil foot pain increasing ROM and strength. He does continue to have R foot pain but notes it is improving. He would benefit from physical therapy to decrease bil foot pain, reduce muscle tension in the calves as well as the posterior cervical/ shoulder musculature and maximize strength and postural efficiency by addressing the deficits listed.   OBJECTIVE IMPAIRMENTS decreased balance, decreased ROM, decreased strength, impaired flexibility, and pain.   ACTIVITY LIMITATIONS community activity and occupation.   PERSONAL FACTORS Fitness, Past/current experiences, and Time since onset of injury/illness/exacerbation are also affecting patient's functional outcome.    REHAB POTENTIAL: Good  CLINICAL DECISION MAKING: Stable/uncomplicated  EVALUATION COMPLEXITY: Low   GOALS: Goals reviewed with patient? Yes  SHORT TERM GOALS: Target date: 03/06/2022  Patient will be I with initial HEP in order to progress with therapy. Baseline: HEP provided at evaluation Goal status: MET 04/03/2022  2.  PT will review FOTO with patient by 3rd visit in order to understand expected progress and outcome with therapy. Baseline: FOTO  assessed at evaluation Goal status: MET 04/03/2022  LONG TERM GOALS: Target date: 05/29/2022  Patient will be I with final HEP to maintain progress from PT. Baseline: HEP provided at evaluation Goal status: ONGOING 04/03/2022  2.  Patient will report >/= 58% status on FOTO to indicate improved functional ability. Baseline: 49% functional status Goal status: ONGOING 04/03/2022  3.  Patient will demonstrate ankle DF AROM >/= 8 deg in order to improve gait and reduce pain with activity Baseline: left -2 deg, right 0 deg Goal status: INITIAL  4.  Patient will exhibit ankle strength 5/5 MMT in order to improve standing and walking tolerance while at work Baseline: PF strength grossly 4-/5 MMT Goal status: INITIAL  5.  Patient will report pain level </= 2-3/10 at end of work day to reduce limping and improve functional mobility. Baseline: 9-10 pain at end of work day Progression: R 7/10, L 4/10 Goal status: ONGOING 04/03/2022  6.  Increase R cervcal rotation/ side bending to [facility] compared bil with </=5/10 pain for functional mobility required for ADLs/ safety with driving. Baseline Goal status: NEW  7.  Increase bil gross shoulder strength to >/= 4+/5 in all planes to promote lifting mechanics  Baseline:  Goal status: NEW  8.  Reduce muscle tension/ spasm in bil upper trap and surrounding muscles to reduce pain and HA frequency to </= 1 every 2 weeks for improvemen in condition.  Baseline:  Goal status: NEW  PLAN: PT FREQUENCY: 1-2x/week  PT DURATION: 6 weeks  PLANNED INTERVENTIONS: Therapeutic  exercises, Therapeutic activity, Neuromuscular re-education, Balance training, Gait training, Patient/Family education, Joint manipulation, Joint mobilization, Aquatic Therapy, Dry Needling, Taping, Ionotophoresis 57m/ml Dexamethasone, and Manual therapy, Traction  PLAN FOR NEXT SESSION: Review HEP and progress PRN, manual/dry needling for calf/foot, calf stretching, progress calf strength and  arch control, load plantar fascia, [employer] traction for neck, posterior shoulder strengthening.   Paisly Fingerhut PT, DPT, LAT, ATC  04/03/22  4:44 PM

## 2022-04-03 ENCOUNTER — Ambulatory Visit: Payer: 59 | Attending: Orthopedic Surgery | Admitting: Physical Therapy

## 2022-04-03 ENCOUNTER — Encounter: Payer: Self-pay | Admitting: Physical Therapy

## 2022-04-03 DIAGNOSIS — M79671 Pain in right foot: Secondary | ICD-10-CM | POA: Diagnosis not present

## 2022-04-03 DIAGNOSIS — M6281 Muscle weakness (generalized): Secondary | ICD-10-CM | POA: Insufficient documentation

## 2022-04-03 DIAGNOSIS — M25511 Pain in right shoulder: Secondary | ICD-10-CM | POA: Insufficient documentation

## 2022-04-03 DIAGNOSIS — M542 Cervicalgia: Secondary | ICD-10-CM | POA: Insufficient documentation

## 2022-04-03 DIAGNOSIS — M79672 Pain in left foot: Secondary | ICD-10-CM | POA: Insufficient documentation

## 2022-04-03 DIAGNOSIS — G8929 Other chronic pain: Secondary | ICD-10-CM | POA: Insufficient documentation

## 2022-04-04 ENCOUNTER — Other Ambulatory Visit (HOSPITAL_COMMUNITY): Payer: Self-pay

## 2022-04-07 ENCOUNTER — Telehealth: Payer: Self-pay | Admitting: *Deleted

## 2022-04-07 ENCOUNTER — Other Ambulatory Visit (HOSPITAL_COMMUNITY): Payer: Self-pay

## 2022-04-07 ENCOUNTER — Inpatient Hospital Stay: Payer: 59 | Admitting: Oncology

## 2022-04-07 MED ORDER — BUTALBITAL-APAP-CAFFEINE 50-325-40 MG PO TABS
1.0000 | ORAL_TABLET | ORAL | 0 refills | Status: DC
  Filled 2022-04-07: qty 180, 30d supply, fill #0

## 2022-04-09 ENCOUNTER — Other Ambulatory Visit (HOSPITAL_COMMUNITY): Payer: Self-pay

## 2022-04-09 ENCOUNTER — Ambulatory Visit: Payer: 59 | Admitting: Physical Therapy

## 2022-04-14 DIAGNOSIS — M9903 Segmental and somatic dysfunction of lumbar region: Secondary | ICD-10-CM | POA: Diagnosis not present

## 2022-04-14 DIAGNOSIS — M9902 Segmental and somatic dysfunction of thoracic region: Secondary | ICD-10-CM | POA: Diagnosis not present

## 2022-04-14 DIAGNOSIS — M9901 Segmental and somatic dysfunction of cervical region: Secondary | ICD-10-CM | POA: Diagnosis not present

## 2022-04-14 DIAGNOSIS — M9904 Segmental and somatic dysfunction of sacral region: Secondary | ICD-10-CM | POA: Diagnosis not present

## 2022-04-24 ENCOUNTER — Encounter: Payer: Self-pay | Admitting: Physical Therapy

## 2022-04-24 ENCOUNTER — Ambulatory Visit: Payer: 59 | Attending: Orthopedic Surgery | Admitting: Physical Therapy

## 2022-04-24 DIAGNOSIS — M25511 Pain in right shoulder: Secondary | ICD-10-CM | POA: Diagnosis not present

## 2022-04-24 DIAGNOSIS — M542 Cervicalgia: Secondary | ICD-10-CM

## 2022-04-24 DIAGNOSIS — M6281 Muscle weakness (generalized): Secondary | ICD-10-CM

## 2022-04-24 DIAGNOSIS — M79672 Pain in left foot: Secondary | ICD-10-CM | POA: Diagnosis not present

## 2022-04-24 DIAGNOSIS — G8929 Other chronic pain: Secondary | ICD-10-CM | POA: Diagnosis not present

## 2022-04-24 DIAGNOSIS — R293 Abnormal posture: Secondary | ICD-10-CM

## 2022-04-24 DIAGNOSIS — M79671 Pain in right foot: Secondary | ICD-10-CM

## 2022-04-24 NOTE — Therapy (Signed)
OUTPATIENT PHYSICAL THERAPY TREATMENT NOTE    Patient Name: Daniel REVOLORIO, MD MRN: [id number] DOB:[birth year], 61 y.o., male Today's Date: 04/24/2022   END OF SESSION:   PT End of Session - 04/24/22 1018     Visit Number 4    Number of Visits 15    Date for PT Re-Evaluation 05/29/22    Authorization Type [place] [employer]    PT Start Time 1016    PT Stop Time 1100    PT Time Calculation (min) 44 min    Activity Tolerance Patient tolerated treatment well    Behavior During Therapy WFL for tasks assessed/performed               Past Medical History:  Diagnosis Date   Anxiety    Aortic atherosclerosis (HCC)    BPH (benign prostatic hyperplasia)    Chronic pain syndrome    neck and low back   DDD (degenerative disc disease), cervical    Diverticulosis    DJD (degenerative joint disease)    Ganglion cyst    12 mm , right posterior knee   Hyperlipidemia    Internal hemorrhoids    Migraines    OA (osteoarthritis)    knees, hands, right shoulder   Positive QuantiFERON-TB Gold test    Shoulder pain    Tear of right rotator cuff    Type 2 diabetes mellitus ([place])    last A1c 5.4 on 04-13-2018 in epic (followed by pcp)   Wears glasses    Past Surgical History:  Procedure Laterality Date   ANAL FISSURE REPAIR     ARTHOSCOPIC ROTAOR CUFF REPAIR Right 06/15/2018   Procedure: RIGHT SHOULDER ARTHROSCOPY, DEBRIDEMENT, SUBACROMIAL DECOMPRESSION, DISTAL CLAVICLE RESECTION, ROTATOR CUFF REPAIR;  Surgeon: [name], MD;  Location: [facility];  Service: Orthopedics;  Laterality: Right;   COLONOSCOPY  05-26-2017   dr Carrington Clamp SIGMOIDOSCOPY N/A 11/05/2021   Procedure: FLEXIBLE SIGMOIDOSCOPY;  Surgeon: Milus Banister, MD;  Location: [facility];  Service: Endoscopy;  Laterality: N/A;   Hip Resurface  2006   KNEE ARTHROSCOPY W/ MENISCAL REPAIR Bilateral right 1993; left 2010   MASS EXCISION Right 04/21/2018   Procedure: EXCISION RIGHT THIGH SOFT TISSUE  MASS;  Surgeon: [name], MD;  Location: [facility];  Service: Orthopedics;  Laterality: Right;   SEPTOPLASTY     SHOULDER ARTHROSCOPY WITH ROTATOR CUFF REPAIR Left    SLAP Tear Repair  2008   TONSILLECTOMY     TOTAL KNEE ARTHROPLASTY Left 12/08/2016   Procedure: LEFT TOTAL KNEE ARTHROPLASTY;  Surgeon: [name], MD;  Location: [facility];  Service: Orthopedics;  Laterality: Left;   TOTAL KNEE ARTHROPLASTY WITH REVISION COMPONENTS Left 04/21/2018   Procedure: Left knee polyethylene exchange ;  Surgeon: [name], MD;  Location: [facility];  Service: Orthopedics;  Laterality: Left;   Patient Active Problem List   Diagnosis Date Noted   Rectal bleed 11/10/2021   BRBPR (bright red blood per rectum) 11/09/2021   Controlled type 2 diabetes mellitus without complication, without long-term current use of insulin ([place]) 11/09/2021   Hyperlipidemia 11/09/2021   Chronic musculoskeletal pain 11/09/2021   Oropharyngeal candidiasis 11/09/2021   Hoarse voice quality 11/09/2021   Near syncope 11/09/2021   Acute lower GI bleeding 11/08/2021   Right shoulder injury 06/15/2018   S/P arthroscopy of right shoulder 06/15/2018   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   OA (osteoarthritis) of knee 12/08/2016   Intractable chronic migraine 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 12/17/2015   Cervical facet joint syndrome 12/17/2015   Chronic bilateral low back pain without sciatica 12/17/2015   Primary osteoarthritis of left knee 12/17/2015    REFERRING DIAG: L plantar fascialitis - Wylene Simmer MD            R shoulder pain -    [name] MD  THERAPY DIAG:  Pain in left foot  Pain in right foot  Muscle weakness (generalized)  Chronic right shoulder pain  Cervicalgia  Abnormal posture  Rationale for Evaluation and Treatment Rehabilitation  PERTINENT HISTORY:  DM II, BMI, chronic pain  PRECAUTIONS: N/A  SUBJECTIVE: " the neck on the R istill gives me trouble between and  8-9/10 during work. The L is goving me more issues.   PAIN:  Are you having pain? Yes: NPRS scale: L 2/10 and R 8/10 Pain location: Bilateral foot, arch (Right > left) Pain description:  Intermittent, stiff and burning Aggravating factors: Pain worse first thing in the morning and then standing/walking extended periods on hard floors Relieving factors: Stretching, ice, tennis ball massage  Bil shoulder pain 710 on the R,  Descrption aching sore   OBJECTIVE: (objective measures completed at initial evaluation unless otherwise dated)   OBJECTIVE:  PATIENT SURVEYS:  FOTO 49% functional status  COGNITION: Overall cognitive status: Within functional limits for tasks assessed     SENSATION: WFL  MUSCLE LENGTH: Calf flexibility limitation  POSTURE:  Bilateral pes planus  PALPATION: Tender with palpable trigger points bilateral calf region  TTP along bil upper trap/ levator scapuluae, cervical paraspinals, sub-occiptals R>L and R pec major/ min at its mid to distal attachment.   CERVICAL ROM:   Active ROM A/PROM (deg) 04/03/2022  Flexion 40  Extension 30 R P!  Right lateral flexion 20  Left lateral flexion 40  Right rotation 58 P!  Left rotation 58   (Blank rows = not tested)  UE ROM:  Active ROM Right 04/03/2022 Left 04/03/2022  Shoulder flexion [facility]  Shoulder extension [facility]  Shoulder abduction 125 115  Shoulder adduction    Shoulder extension    Shoulder internal rotation    Shoulder external rotation [facility]  Elbow flexion    Elbow extension    Wrist flexion    Wrist extension    Wrist ulnar deviation    Wrist radial deviation    Wrist pronation    Wrist supination     (Blank rows = not tested)  UE MMT:  MMT Right 04/03/2022 Left 04/03/2022  Shoulder flexion 4 P! 4  Shoulder extension 5 5  Shoulder abduction 4 P! 4  Shoulder adduction    Shoulder extension    Shoulder internal rotation 4+ 4+/5  Shoulder external rotation 4 P! 4  Middle  trapezius    Lower trapezius    Elbow flexion    Elbow extension    Wrist flexion    Wrist extension    Wrist ulnar deviation    Wrist radial deviation    Wrist pronation    Wrist supination    Grip strength     (Blank rows = not tested)   LE ROM:  Active ROM Right 02/13/2022 Left 02/13/2022 Right 04/03/2022 Left 04/03/2022  Ankle dorsiflexion 0 -2 3 5   Ankle plantarflexion 50 50 55 58  Ankle inversion 20 25 22 20   Ankle eversion 10 10 10 10    LE MMT:  MMT Right 02/13/2022 Left 02/13/2022 Right 04/03/2022 Left 04/03/2022  Hip  flexion 5 5    Hip abduction 4 4    Knee flexion 5 5    Knee extension 5 5    Ankle dorsiflexion 5 5 5 5   Ankle plantarflexion 4- 4- 4 4+  Ankle inversion 4 4 4  4+  Ankle eversion 4 4 4  4+   FUNCTIONAL TESTS:  SLS < 10 sec bilaterally  GAIT: Assistive device utilized: None Level of assistance: Complete Independence   TODAY'S TREATMENT: [facility] Adult PT Treatment:                                                DATE: 04/09/2022 Therapeutic Exercise: Calf stretch 2 x 30 sec manual  Manual Therapy: IASTM in the R calf / X technique along the medial calcaneal tubercle Active Release of the calf  IASTM R upper trap/ levator scapulae Thoracic PA T1-T8 grade IV Inferior rib robs grade IV  Trigger Point Dry-Needling  Treatment instructions: Expect mild to moderate muscle soreness. S/S of pneumothorax if dry needled over a lung field, and to seek immediate medical attention should they occur. Patient verbalized understanding of these instructions and education.  Patient Consent Given: Yes Education handout provided: Previously provided Muscles treated: Quadratus plantae, R gastroc / soleus, R upper trap/ cervical paraspinals Electrical stimulation performed: Yes Parameters:  CPS L 20 x 10 min adjusting intermittently  Treatment response/outcome: twitch response    [facility] Adult PT Treatment:                                                DATE:  03/17/2022 Therapeutic Exercise: Self R cervical gapping with overpressure using towel   Reivewed HEP and updated today for neck/ shoulders.  Manual Therapy: IASTM along the bil upper trap, levator scapuale, sub-occipitals, and R gastroc  Trigger Point Dry-Needling  Treatment instructions: Expect mild to moderate muscle soreness. S/S of pneumothorax if dry needled over a lung field, and to seek immediate medical attention should they occur. Patient verbalized understanding of these instructions and education.  Patient Consent Given: Yes Education handout provided: Previously provided Muscles treated: R gastroc/ soleus, bil upper trap/ levator and cervical paraspinals at C 3& C6 bil Electrical stimulation performed: No Parameters: N/A Treatment response/outcome: twitch response noted.     PATIENT EDUCATION:  Education details: Exam findings, POC, HEP, dry needling future appointments Person educated: Patient Education method: Explanation, Demonstration, Tactile cues, Verbal cues, and Handouts Education comprehension: verbalized understanding, returned demonstration, verbal cues required, tactile cues required, and needs further education  HOME EXERCISE PROGRAM: Access Code: [email or web address] URL: [email or web address] Date: 03/12/2022 Prepared by: Starr Lake  Exercises - Seated Plantar Fascia Stretch  - 1 x daily - 3 reps - 30 seconds hold - Seated Figure 4 Ankle Inversion with Resistance  - 1 x daily - 2-3 sets - 15 reps - Calf Mobilization with Small Ball  - 1 x daily - 10-15 reps - 2-3 minutes hold - Gastroc Stretch on Wall  - 1 x daily - 3 reps - 30 seconds hold - Standing Eccentric Heel Raise  - 1 x daily - 2-3 sets - 10 reps - Standing Heel Raises  - 1 x daily - 7 x weekly - 3 sets - 20 reps  ASSESSMENT: CLINICAL IMPRESSION: Pt arrives to session noting increased RLE pain in the foot and in the R shoulder/ neck. Continued TPDN for the R foot, R calf (with  E-stim) and R shoulder/ neck followed with IASTM techniques, thoracic / rib mobs/ and active release techniques for the Calf. Focused session primarily on manual today to relieve pain/ tension. End of session he noted feeling sore which is likely a result of the TPDN.   OBJECTIVE IMPAIRMENTS decreased balance, decreased ROM, decreased strength, impaired flexibility, and pain.   ACTIVITY LIMITATIONS community activity and occupation.   PERSONAL FACTORS Fitness, Past/current experiences, and Time since onset of injury/illness/exacerbation are also affecting patient's functional outcome.    REHAB POTENTIAL: Good  CLINICAL DECISION MAKING: Stable/uncomplicated  EVALUATION COMPLEXITY: Low   GOALS: Goals reviewed with patient? Yes  SHORT TERM GOALS: Target date: 03/06/2022  Patient will be I with initial HEP in order to progress with therapy. Baseline: HEP provided at evaluation Goal status: MET 04/03/2022  2.  PT will review FOTO with patient by 3rd visit in order to understand expected progress and outcome with therapy. Baseline: FOTO assessed at evaluation Goal status: MET 04/03/2022  LONG TERM GOALS: Target date: 05/29/2022  Patient will be I with final HEP to maintain progress from PT. Baseline: HEP provided at evaluation Goal status: ONGOING 04/03/2022  2.  Patient will report >/= 58% status on FOTO to indicate improved functional ability. Baseline: 49% functional status Goal status: ONGOING 04/03/2022  3.  Patient will demonstrate ankle DF AROM >/= 8 deg in order to improve gait and reduce pain with activity Baseline: left -2 deg, right 0 deg Goal status: INITIAL  4.  Patient will exhibit ankle strength 5/5 MMT in order to improve standing and walking tolerance while at work Baseline: PF strength grossly 4-/5 MMT Goal status: INITIAL  5.  Patient will report pain level </= 2-3/10 at end of work day to reduce limping and improve functional mobility. Baseline: 9-10 pain at  end of work day Progression: R 7/10, L 4/10 Goal status: ONGOING 04/03/2022  6.  Increase R cervcal rotation/ side bending to [facility] compared bil with </=5/10 pain for functional mobility required for ADLs/ safety with driving. Baseline Goal status: NEW  7.  Increase bil gross shoulder strength to >/= 4+/5 in all planes to promote lifting mechanics  Baseline:  Goal status: NEW  8.  Reduce muscle tension/ spasm in bil upper trap and surrounding muscles to reduce pain and HA frequency to </= 1 every 2 weeks for improvemen in condition.  Baseline:  Goal status: NEW  PLAN: PT FREQUENCY: 1-2x/week  PT DURATION: 6 weeks  PLANNED INTERVENTIONS: Therapeutic exercises, Therapeutic activity, Neuromuscular re-education, Balance training, Gait training, Patient/Family education, Joint manipulation, Joint mobilization, Aquatic Therapy, Dry Needling, Taping, Ionotophoresis 41m/ml Dexamethasone, and Manual therapy, Traction  PLAN FOR NEXT SESSION: Review HEP and progress PRN, manual/dry needling for calf/foot, calf stretching, progress calf strength and arch control, load plantar fascia, Trail mechanica traction for neck, posterior shoulder strengthening.   Johnwilliam Shepperson PT, DPT, LAT, ATC  04/24/22  11:27 AM

## 2022-04-29 ENCOUNTER — Ambulatory Visit: Payer: 59 | Admitting: Physical Therapy

## 2022-04-29 ENCOUNTER — Encounter: Payer: Self-pay | Admitting: Physical Therapy

## 2022-04-29 DIAGNOSIS — M6281 Muscle weakness (generalized): Secondary | ICD-10-CM | POA: Diagnosis not present

## 2022-04-29 DIAGNOSIS — G8929 Other chronic pain: Secondary | ICD-10-CM

## 2022-04-29 DIAGNOSIS — M542 Cervicalgia: Secondary | ICD-10-CM

## 2022-04-29 DIAGNOSIS — M25511 Pain in right shoulder: Secondary | ICD-10-CM | POA: Diagnosis not present

## 2022-04-29 DIAGNOSIS — M79671 Pain in right foot: Secondary | ICD-10-CM | POA: Diagnosis not present

## 2022-04-29 DIAGNOSIS — M79672 Pain in left foot: Secondary | ICD-10-CM

## 2022-04-29 DIAGNOSIS — R293 Abnormal posture: Secondary | ICD-10-CM | POA: Diagnosis not present

## 2022-04-29 NOTE — Therapy (Addendum)
OUTPATIENT PHYSICAL THERAPY TREATMENT NOTE / DISCHARGE   Patient Name: Daniel CRANSTON, MD MRN: [id number] DOB:1961/07/28, 61 y.o., male Today's Date: 04/29/2022   END OF SESSION:   PT End of Session - 04/29/22 1503     Visit Number 5    Number of Visits 15    Date for PT Re-Evaluation 05/29/22    Authorization Type [place] [employer]    PT Start Time 1501    PT Stop Time 1547    PT Time Calculation (min) 46 min    Activity Tolerance Patient tolerated treatment well    Behavior During Therapy WFL for tasks assessed/performed                Past Medical History:  Diagnosis Date   Anxiety    Aortic atherosclerosis (HCC)    BPH (benign prostatic hyperplasia)    Chronic pain syndrome    neck and low back   DDD (degenerative disc disease), cervical    Diverticulosis    DJD (degenerative joint disease)    Ganglion cyst    12 mm , right posterior knee   Hyperlipidemia    Internal hemorrhoids    Migraines    OA (osteoarthritis)    knees, hands, right shoulder   Positive QuantiFERON-TB Gold test    Shoulder pain    Tear of right rotator cuff    Type 2 diabetes mellitus ([place])    last A1c 5.4 on 04-13-2018 in epic (followed by pcp)   Wears glasses    Past Surgical History:  Procedure Laterality Date   ANAL FISSURE REPAIR     ARTHOSCOPIC ROTAOR CUFF REPAIR Right 06/15/2018   Procedure: RIGHT SHOULDER ARTHROSCOPY, DEBRIDEMENT, SUBACROMIAL DECOMPRESSION, DISTAL CLAVICLE RESECTION, ROTATOR CUFF REPAIR;  Surgeon: [name], MD;  Location: [place];  Service: Orthopedics;  Laterality: Right;   COLONOSCOPY  05-26-2017   dr Carrington Clamp SIGMOIDOSCOPY N/A 11/05/2021   Procedure: FLEXIBLE SIGMOIDOSCOPY;  Surgeon: Milus Banister, MD;  Location: [facility];  Service: Endoscopy;  Laterality: N/A;   Hip Resurface  2006   KNEE ARTHROSCOPY W/ MENISCAL REPAIR Bilateral right 1993; left 2010   MASS EXCISION Right 04/21/2018   Procedure: EXCISION RIGHT THIGH  SOFT TISSUE MASS;  Surgeon: [name], MD;  Location: [facility];  Service: Orthopedics;  Laterality: Right;   SEPTOPLASTY     SHOULDER ARTHROSCOPY WITH ROTATOR CUFF REPAIR Left    SLAP Tear Repair  2008   TONSILLECTOMY     TOTAL KNEE ARTHROPLASTY Left 12/08/2016   Procedure: LEFT TOTAL KNEE ARTHROPLASTY;  Surgeon: [name], MD;  Location: [facility];  Service: Orthopedics;  Laterality: Left;   TOTAL KNEE ARTHROPLASTY WITH REVISION COMPONENTS Left 04/21/2018   Procedure: Left knee polyethylene exchange ;  Surgeon: [name], MD;  Location: [facility];  Service: Orthopedics;  Laterality: Left;   Patient Active Problem List   Diagnosis Date Noted   Rectal bleed 11/10/2021   BRBPR (bright red blood per rectum) 11/09/2021   Controlled type 2 diabetes mellitus without complication, without long-term current use of insulin ([place]) 11/09/2021   Hyperlipidemia 11/09/2021   Chronic musculoskeletal pain 11/09/2021   Oropharyngeal candidiasis 11/09/2021   Hoarse voice quality 11/09/2021   Near syncope 11/09/2021   Acute lower GI bleeding 11/08/2021   Right shoulder injury 06/15/2018   S/P arthroscopy of right shoulder 06/15/2018   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   OA (osteoarthritis) of knee 12/08/2016   Intractable chronic migraine  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 The  R  upper trap is still giving me issues. "  PAIN:  Are you having pain? Yes: NPRS scale: L 2/10 and R 8/10 Pain location: Bilateral foot, arch (Right > left) Pain description:  Intermittent, stiff and burning Aggravating factors: Pain worse first thing in the morning and then standing/walking extended periods on hard floors Relieving factors: Stretching, ice, tennis ball massage  Bil shoulder pain 710 on the R,  Descrption aching sore   OBJECTIVE: (objective measures completed at initial evaluation unless otherwise dated)   OBJECTIVE:  PATIENT SURVEYS:  FOTO 49% functional status  COGNITION: Overall cognitive status: Within functional limits for tasks assessed     SENSATION: WFL  MUSCLE LENGTH: Calf flexibility limitation  POSTURE:  Bilateral pes planus  PALPATION: Tender with palpable trigger points bilateral calf region  TTP along bil upper trap/ levator scapuluae, cervical paraspinals, sub-occiptals R>L and R pec major/ min at its mid to distal attachment.   CERVICAL ROM:   Active ROM A/PROM (deg) 04/03/2022  Flexion 40  Extension 30 R P!  Right lateral flexion 20  Left lateral flexion 40  Right rotation 58 P!  Left rotation 58   (Blank rows = not tested)  UE ROM:  Active ROM Right 04/03/2022 Left 04/03/2022  Shoulder flexion [facility]  Shoulder extension [facility]  Shoulder abduction 125 115  Shoulder adduction    Shoulder extension    Shoulder internal rotation    Shoulder external rotation [facility]  Elbow flexion    Elbow extension    Wrist flexion    Wrist extension    Wrist ulnar deviation    Wrist radial deviation    Wrist pronation    Wrist supination     (Blank rows = not tested)  UE MMT:  MMT Right 04/03/2022 Left 04/03/2022  Shoulder flexion 4 P! 4  Shoulder extension 5 5  Shoulder abduction 4 P! 4  Shoulder adduction    Shoulder extension    Shoulder internal rotation 4+ 4+/5  Shoulder external rotation 4 P! 4  Middle trapezius     Lower trapezius    Elbow flexion    Elbow extension    Wrist flexion    Wrist extension    Wrist ulnar deviation    Wrist radial deviation    Wrist pronation    Wrist supination    Grip strength     (Blank rows = not tested)   LE ROM:  Active ROM Right 02/13/2022 Left 02/13/2022 Right 04/03/2022 Left 04/03/2022  Ankle dorsiflexion 0 -_0 Ankle plantarflexion 50 50 55 58  Ankle inversion _1 Ankle eversion _2 LE MMT:  MMT Right 02/13/2022 Left 02/13/2022 Right 04/03/2022 Left 04/03/2022  Hip  flexion 5 5    Hip abduction 4 4    Knee flexion 5 5    Knee extension 5 5    Ankle dorsiflexion _0 Ankle plantarflexion 4- 4- 4 4+  Ankle inversion _1 4+  Ankle eversion _2 4+   FUNCTIONAL TESTS:  SLS < 10 sec bilaterally  GAIT: Assistive device utilized: None Level of assistance: Complete Independence   TODAY'S TREATMENT:  [facility] Adult PT Treatment:                                                DATE: 04/29/2022 Therapeutic Exercise: R upper trap stretch 1 x 30 sec  Reviewed stretching HEP and modified calf stretch and standing upper trap stretch Manual Therapy: IASTM along bil gastroc/ soleus  IASTM along R upper trap/ levator scapulae on the R R upper trap inhibition taping Thoracic T1-T10 PA grade IV Bil first rib inferior mobs grade IV  Trigger Point Dry-Needling  Treatment instructions: Expect mild to moderate muscle soreness. S/S of pneumothorax if dry needled over a lung field, and to seek immediate medical attention should they occur. Patient verbalized understanding of these instructions and education.  Patient Consent Given: Yes Education handout provided: Previously provided Muscles treated: bil gastroc/ soleus,  Electrical stimulation performed: No Parameters: N/A Treatment response/outcome:     [facility] Adult PT Treatment:                                                DATE: 04/09/2022 Therapeutic Exercise: Calf stretch 2  x 30 sec manual  Manual Therapy: IASTM in the R calf / X technique along the medial calcaneal tubercle Active Release of the calf  IASTM R upper trap/ levator scapulae Thoracic PA T1-T8 grade IV Inferior rib robs grade IV  Trigger Point Dry-Needling  Treatment instructions: Expect mild to moderate muscle soreness. S/S of pneumothorax if dry needled over a lung field, and to seek immediate medical attention should they occur. Patient verbalized understanding of these instructions and education.  Patient Consent Given: Yes Education handout provided: Previously provided Muscles treated: Quadratus plantae, R gastroc / soleus, R upper trap/ cervical paraspinals Electrical stimulation performed: Yes Parameters:  CPS L 20 x 10 min adjusting intermittently  Treatment response/outcome: twitch response  [facility] Adult PT Treatment:                                                DATE: 03/17/2022 Therapeutic Exercise: Self R cervical gapping with overpressure using towel   Reivewed HEP and updated today for neck/ shoulders.  Manual Therapy: IASTM along the bil upper trap, levator scapuale, sub-occipitals, and R gastroc  Trigger Point Dry-Needling  Treatment instructions: Expect mild to moderate muscle soreness. S/S of pneumothorax if dry needled over a lung field, and to seek immediate medical attention should they occur. Patient verbalized understanding of these instructions and education.  Patient Consent Given: Yes Education handout provided: Previously provided Muscles treated: R gastroc/ soleus, bil upper trap/ levator and cervical paraspinals at C 3& C6 bil Electrical stimulation  performed: No Parameters: N/A Treatment response/outcome: twitch response noted.     PATIENT EDUCATION:  Education details: Exam findings, POC, HEP, dry needling future appointments Person educated: Patient Education method: Explanation, Demonstration, Tactile cues, Verbal cues, and Handouts Education  comprehension: verbalized understanding, returned demonstration, verbal cues required, tactile cues required, and needs further education  HOME EXERCISE PROGRAM: Access Code: [email or web address] URL: [email or web address] Date: 03/12/2022 Prepared by: Starr Lake  Exercises - Seated Plantar Fascia Stretch  - 1 x daily - 3 reps - 30 seconds hold - Seated Figure 4 Ankle Inversion with Resistance  - 1 x daily - 2-3 sets - 15 reps - Calf Mobilization with Small Ball  - 1 x daily - 10-15 reps - 2-3 minutes hold - Gastroc Stretch on Wall  - 1 x daily - 3 reps - 30 seconds hold - Standing Eccentric Heel Raise  - 1 x daily - 2-3 sets - 10 reps - Standing Heel Raises  - 1 x daily - 7 x weekly - 3 sets - 20 reps   ASSESSMENT: CLINICAL IMPRESSION: Pt reports improvement in R foot pain since the last session but continues to report bil calf tension and R upper trap/ levator scapulae trigger points. Continued TPDN focusing on the bil calves, R upper trap/ levator scapulae followed with IASTM techniques, thoracic/ rib mobs and upper trap inhibition taping. End of session he noted reduction in stiffness in the calf with walking/ standing.    OBJECTIVE IMPAIRMENTS decreased balance, decreased ROM, decreased strength, impaired flexibility, and pain.   ACTIVITY LIMITATIONS community activity and occupation.   PERSONAL FACTORS Fitness, Past/current experiences, and Time since onset of injury/illness/exacerbation are also affecting patient's functional outcome.    REHAB POTENTIAL: Good  CLINICAL DECISION MAKING: Stable/uncomplicated  EVALUATION COMPLEXITY: Low   GOALS: Goals reviewed with patient? Yes  SHORT TERM GOALS: Target date: 03/06/2022  Patient will be I with initial HEP in order to progress with therapy. Baseline: HEP provided at evaluation Goal status: MET 04/03/2022  2.  PT will review FOTO with patient by 3rd visit in order to understand expected progress and outcome with  therapy. Baseline: FOTO assessed at evaluation Goal status: MET 04/03/2022   LONG TERM GOALS: Target date: 05/29/2022  Patient will be I with final HEP to maintain progress from PT. Baseline: HEP provided at evaluation Goal status: ONGOING 04/03/2022  2.  Patient will report >/= 58% status on FOTO to indicate improved functional ability. Baseline: 49% functional status Goal status: ONGOING 04/03/2022  3.  Patient will demonstrate ankle DF AROM >/= 8 deg in order to improve gait and reduce pain with activity Baseline: left -2 deg, right 0 deg Goal status: INITIAL  4.  Patient will exhibit ankle strength 5/5 MMT in order to improve standing and walking tolerance while at work Baseline: PF strength grossly 4-/5 MMT Goal status: INITIAL  5.  Patient will report pain level </= 2-3/10 at end of work day to reduce limping and improve functional mobility. Baseline: 9-10 pain at end of work day Progression: R 7/10, L 4/10 Goal status: ONGOING 04/03/2022  6.  Increase R cervcal rotation/ side bending to [facility] compared bil with </=5/10 pain for functional mobility required for ADLs/ safety with driving. Baseline Goal status: NEW  7.  Increase bil gross shoulder strength to >/= 4+/5 in all planes to promote lifting mechanics  Baseline:  Goal status: NEW  8.  Reduce muscle tension/ spasm in bil upper trap and surrounding muscles to reduce pain  and HA frequency to </= 1 every 2 weeks for improvemen in condition.  Baseline:  Goal status: NEW  PLAN: PT FREQUENCY: 1-2x/week  PT DURATION: 6 weeks  PLANNED INTERVENTIONS: Therapeutic exercises, Therapeutic activity, Neuromuscular re-education, Balance training, Gait training, Patient/Family education, Joint manipulation, Joint mobilization, Aquatic Therapy, Dry Needling, Taping, Ionotophoresis 4m/ml Dexamethasone, and Manual therapy, Traction  PLAN FOR NEXT SESSION: Review HEP and progress PRN, manual/dry needling for calf/foot, calf stretching,  progress calf strength and arch control, load plantar fascia, [employer] traction for neck, posterior shoulder strengthening.     PT, DPT, LAT, ATC  04/29/22  3:52 PM       PHYSICAL THERAPY DISCHARGE SUMMARY  Visits from Start of Care: 5  Current functional level related to goals / functional outcomes: See goals   Remaining deficits: Current status unknown   Education / Equipment: HEP, theraband, posture   Patient agrees to discharge. Patient goals were partially met. Patient is being discharged due to not returning since the last visit.    PT, DPT, LAT, ATC  07/15/22  7:34 AM

## 2022-05-02 ENCOUNTER — Other Ambulatory Visit (HOSPITAL_COMMUNITY): Payer: Self-pay

## 2022-05-02 MED ORDER — IPRATROPIUM-ALBUTEROL 20-100 MCG/ACT IN AERS
2.0000 | INHALATION_SPRAY | Freq: Four times a day (QID) | RESPIRATORY_TRACT | 1 refills | Status: DC | PRN
  Filled 2022-05-02: qty 4, 15d supply, fill #0
  Filled 2022-06-09: qty 4, 15d supply, fill #1

## 2022-05-02 MED ORDER — AMOXICILLIN-POT CLAVULANATE 875-125 MG PO TABS
1.0000 | ORAL_TABLET | Freq: Two times a day (BID) | ORAL | 0 refills | Status: DC
  Filled 2022-05-02: qty 14, 7d supply, fill #0

## 2022-05-05 ENCOUNTER — Ambulatory Visit: Payer: 59 | Admitting: Physical Therapy

## 2022-05-05 ENCOUNTER — Other Ambulatory Visit (HOSPITAL_COMMUNITY): Payer: Self-pay

## 2022-05-07 ENCOUNTER — Other Ambulatory Visit (HOSPITAL_COMMUNITY): Payer: Self-pay

## 2022-05-07 ENCOUNTER — Ambulatory Visit: Payer: 59 | Admitting: Physical Therapy

## 2022-05-08 ENCOUNTER — Other Ambulatory Visit (HOSPITAL_COMMUNITY): Payer: Self-pay

## 2022-05-09 ENCOUNTER — Other Ambulatory Visit (HOSPITAL_COMMUNITY): Payer: Self-pay

## 2022-05-09 MED ORDER — TADALAFIL 5 MG PO TABS
5.0000 mg | ORAL_TABLET | Freq: Every day | ORAL | 2 refills | Status: DC
  Filled 2022-05-09 – 2022-07-29 (×2): qty 90, 90d supply, fill #0
  Filled 2022-10-28: qty 90, 90d supply, fill #1
  Filled 2023-01-29: qty 90, 90d supply, fill #2

## 2022-05-12 DIAGNOSIS — M9903 Segmental and somatic dysfunction of lumbar region: Secondary | ICD-10-CM | POA: Diagnosis not present

## 2022-05-12 DIAGNOSIS — M9902 Segmental and somatic dysfunction of thoracic region: Secondary | ICD-10-CM | POA: Diagnosis not present

## 2022-05-12 DIAGNOSIS — M9901 Segmental and somatic dysfunction of cervical region: Secondary | ICD-10-CM | POA: Diagnosis not present

## 2022-05-12 DIAGNOSIS — M9904 Segmental and somatic dysfunction of sacral region: Secondary | ICD-10-CM | POA: Diagnosis not present

## 2022-05-19 ENCOUNTER — Telehealth: Payer: Self-pay | Admitting: Physical Medicine & Rehabilitation

## 2022-05-19 NOTE — Telephone Encounter (Signed)
Patient is calling to request an appointment for Botox injections.  Patient hasn't been in office since 05/10/21.  Please advise.

## 2022-05-20 ENCOUNTER — Other Ambulatory Visit (HOSPITAL_COMMUNITY): Payer: Self-pay

## 2022-05-20 MED ORDER — DICYCLOMINE HCL 20 MG PO TABS
20.0000 mg | ORAL_TABLET | ORAL | 0 refills | Status: DC
  Filled 2022-05-20: qty 30, 5d supply, fill #0

## 2022-05-26 ENCOUNTER — Other Ambulatory Visit (HOSPITAL_COMMUNITY): Payer: Self-pay

## 2022-05-27 ENCOUNTER — Other Ambulatory Visit (HOSPITAL_COMMUNITY): Payer: Self-pay

## 2022-05-27 ENCOUNTER — Encounter: Payer: Self-pay | Admitting: Physical Medicine & Rehabilitation

## 2022-05-27 ENCOUNTER — Encounter: Payer: 59 | Attending: Physical Medicine & Rehabilitation | Admitting: Physical Medicine & Rehabilitation

## 2022-05-27 VITALS — BP 121/80 | HR 85 | Ht 72.0 in | Wt 307.4 lb

## 2022-05-27 DIAGNOSIS — G43719 Chronic migraine without aura, intractable, without status migrainosus: Secondary | ICD-10-CM | POA: Diagnosis not present

## 2022-05-27 NOTE — Progress Notes (Signed)
Botox injection for chronic migraine prophylaxis.  Indication: History of migraines with greater than 15 headaches per month despite trials of oral medications.  Informed consent was obtained after discussing risks and benefits of the procedure with the patient this included bleeding bruising infection as well as facial drooping, eyelid lag, systemic effects of Botox. Patient elects to proceed and has given written consent.  Last Botox August 2021, HA freq increased in the last several months  Patient placed in a seated position Dilution 50 units per cc  Muscles injected with dose  Procerus 5 units Corrugator 5 units bilateral Frontalis 5 units 2 injection sites on the right and 2 injection sites on the left Temporalis 5 units in 4 injection sites on the right and 4 injection sites on the left Occipitalis 5 units into 3 injections sites on the right and 3 injection sites on the left  Cervical paraspinals 5 units into 2 injection sites on the right and 2 injection sites on the left trapezius 5 units into 3 injection sites on the right and 3 injection sites on the left bilateral levator scapula, 5 units bilaterally Patient tolerated procedure well. Post procedure instructions given.  Right L4 Paraspinal 45U Appointment for repeat injection , pt will call as he gets >6 mo relief from treatment

## 2022-05-28 ENCOUNTER — Other Ambulatory Visit (HOSPITAL_COMMUNITY): Payer: Self-pay

## 2022-05-29 ENCOUNTER — Other Ambulatory Visit (HOSPITAL_COMMUNITY): Payer: Self-pay

## 2022-05-29 MED ORDER — PREDNISONE 50 MG PO TABS
50.0000 mg | ORAL_TABLET | Freq: Every day | ORAL | 1 refills | Status: DC
  Filled 2022-05-29: qty 7, 7d supply, fill #0
  Filled 2022-06-09: qty 7, 7d supply, fill #1

## 2022-06-09 ENCOUNTER — Other Ambulatory Visit (HOSPITAL_COMMUNITY): Payer: Self-pay

## 2022-06-09 DIAGNOSIS — M47812 Spondylosis without myelopathy or radiculopathy, cervical region: Secondary | ICD-10-CM | POA: Diagnosis not present

## 2022-06-09 MED ORDER — DICYCLOMINE HCL 20 MG PO TABS
20.0000 mg | ORAL_TABLET | ORAL | 0 refills | Status: DC
  Filled 2022-06-09: qty 30, 5d supply, fill #0

## 2022-06-09 MED ORDER — BUTALBITAL-APAP-CAFFEINE 50-325-40 MG PO TABS
1.0000 | ORAL_TABLET | ORAL | 0 refills | Status: DC | PRN
  Filled 2022-06-09: qty 180, 30d supply, fill #0

## 2022-06-09 MED ORDER — DICYCLOMINE HCL 20 MG PO TABS
20.0000 mg | ORAL_TABLET | ORAL | 0 refills | Status: DC
  Filled 2022-06-09 – 2022-07-09 (×2): qty 30, 5d supply, fill #0

## 2022-06-09 MED ORDER — TOPIRAMATE 25 MG PO TABS
25.0000 mg | ORAL_TABLET | Freq: Every day | ORAL | 3 refills | Status: DC
  Filled 2022-06-09: qty 90, 90d supply, fill #0

## 2022-06-09 MED ORDER — OXYCODONE HCL 5 MG PO TABS
5.0000 mg | ORAL_TABLET | Freq: Three times a day (TID) | ORAL | 0 refills | Status: DC | PRN
  Filled 2022-06-09: qty 180, 30d supply, fill #0

## 2022-06-09 MED ORDER — AREXVY 120 MCG/0.5ML IM SUSR
INTRAMUSCULAR | 0 refills | Status: DC
  Filled 2022-06-09 – 2022-06-23 (×2): qty 1, 1d supply, fill #0

## 2022-06-13 ENCOUNTER — Other Ambulatory Visit (HOSPITAL_COMMUNITY): Payer: Self-pay

## 2022-06-23 ENCOUNTER — Other Ambulatory Visit (HOSPITAL_COMMUNITY): Payer: Self-pay

## 2022-06-24 ENCOUNTER — Other Ambulatory Visit (HOSPITAL_COMMUNITY): Payer: Self-pay

## 2022-06-25 ENCOUNTER — Other Ambulatory Visit (HOSPITAL_COMMUNITY): Payer: Self-pay

## 2022-06-25 DIAGNOSIS — M9901 Segmental and somatic dysfunction of cervical region: Secondary | ICD-10-CM | POA: Diagnosis not present

## 2022-06-25 DIAGNOSIS — M9904 Segmental and somatic dysfunction of sacral region: Secondary | ICD-10-CM | POA: Diagnosis not present

## 2022-06-25 DIAGNOSIS — M9903 Segmental and somatic dysfunction of lumbar region: Secondary | ICD-10-CM | POA: Diagnosis not present

## 2022-06-25 DIAGNOSIS — M9902 Segmental and somatic dysfunction of thoracic region: Secondary | ICD-10-CM | POA: Diagnosis not present

## 2022-06-25 MED ORDER — INFLUENZA VAC A&B SA ADJ QUAD 0.5 ML IM PRSY
0.5000 mL | PREFILLED_SYRINGE | INTRAMUSCULAR | 0 refills | Status: DC
  Filled 2022-06-25: qty 0.5, 1d supply, fill #0

## 2022-06-25 MED ORDER — INFLUENZA VAC SPLIT QUAD 0.5 ML IM SUSY
0.5000 mL | PREFILLED_SYRINGE | INTRAMUSCULAR | 0 refills | Status: DC
  Filled 2022-06-25: qty 0.5, 1d supply, fill #0

## 2022-07-03 ENCOUNTER — Other Ambulatory Visit (HOSPITAL_COMMUNITY): Payer: Self-pay

## 2022-07-03 MED ORDER — ZOLPIDEM TARTRATE 10 MG PO TABS
10.0000 mg | ORAL_TABLET | Freq: Every evening | ORAL | 0 refills | Status: DC | PRN
  Filled 2022-07-03: qty 90, 90d supply, fill #0

## 2022-07-04 ENCOUNTER — Other Ambulatory Visit (HOSPITAL_COMMUNITY): Payer: Self-pay

## 2022-07-09 ENCOUNTER — Other Ambulatory Visit (HOSPITAL_COMMUNITY): Payer: Self-pay

## 2022-07-09 MED ORDER — KETOCONAZOLE 2 % EX CREA
TOPICAL_CREAM | Freq: Two times a day (BID) | CUTANEOUS | 4 refills | Status: DC
  Filled 2022-07-09: qty 30, 30d supply, fill #0
  Filled 2022-09-22: qty 30, 30d supply, fill #1
  Filled 2022-10-28: qty 30, 30d supply, fill #2
  Filled 2022-12-05: qty 30, 30d supply, fill #3

## 2022-07-09 MED ORDER — DIAZEPAM 5 MG PO TABS
5.0000 mg | ORAL_TABLET | Freq: Two times a day (BID) | ORAL | 0 refills | Status: DC
  Filled 2022-07-09: qty 180, 90d supply, fill #0

## 2022-07-09 MED ORDER — METFORMIN HCL 1000 MG PO TABS
1000.0000 mg | ORAL_TABLET | Freq: Two times a day (BID) | ORAL | 3 refills | Status: DC
  Filled 2022-07-09: qty 180, 90d supply, fill #0
  Filled 2022-10-28: qty 180, 90d supply, fill #1
  Filled 2023-01-29: qty 180, 90d supply, fill #2

## 2022-07-09 MED ORDER — OXYCODONE HCL 5 MG PO TABS
5.0000 mg | ORAL_TABLET | Freq: Three times a day (TID) | ORAL | 0 refills | Status: DC | PRN
  Filled 2022-07-09: qty 180, 30d supply, fill #0

## 2022-07-09 MED ORDER — RIZATRIPTAN BENZOATE 5 MG PO TBDP
5.0000 mg | ORAL_TABLET | ORAL | 3 refills | Status: DC
  Filled 2022-07-09: qty 15, 30d supply, fill #0
  Filled 2022-10-28: qty 15, 30d supply, fill #1
  Filled 2022-12-05: qty 15, 30d supply, fill #2
  Filled 2023-01-29: qty 15, 30d supply, fill #3

## 2022-07-14 ENCOUNTER — Other Ambulatory Visit (HOSPITAL_COMMUNITY): Payer: Self-pay

## 2022-07-21 ENCOUNTER — Other Ambulatory Visit (HOSPITAL_COMMUNITY): Payer: Self-pay

## 2022-07-21 DIAGNOSIS — M5416 Radiculopathy, lumbar region: Secondary | ICD-10-CM | POA: Diagnosis not present

## 2022-07-29 ENCOUNTER — Other Ambulatory Visit (HOSPITAL_COMMUNITY): Payer: Self-pay

## 2022-07-29 MED ORDER — BUTALBITAL-APAP-CAFFEINE 50-325-40 MG PO TABS
1.0000 | ORAL_TABLET | ORAL | 0 refills | Status: DC | PRN
  Filled 2022-07-29: qty 180, 30d supply, fill #0

## 2022-07-30 DIAGNOSIS — M9902 Segmental and somatic dysfunction of thoracic region: Secondary | ICD-10-CM | POA: Diagnosis not present

## 2022-07-30 DIAGNOSIS — M9903 Segmental and somatic dysfunction of lumbar region: Secondary | ICD-10-CM | POA: Diagnosis not present

## 2022-07-30 DIAGNOSIS — M9904 Segmental and somatic dysfunction of sacral region: Secondary | ICD-10-CM | POA: Diagnosis not present

## 2022-07-30 DIAGNOSIS — M9901 Segmental and somatic dysfunction of cervical region: Secondary | ICD-10-CM | POA: Diagnosis not present

## 2022-07-31 ENCOUNTER — Other Ambulatory Visit (HOSPITAL_COMMUNITY): Payer: Self-pay

## 2022-07-31 MED ORDER — XARELTO 20 MG PO TABS
20.0000 mg | ORAL_TABLET | Freq: Every day | ORAL | 3 refills | Status: DC
  Filled 2023-07-24: qty 90, 90d supply, fill #0

## 2022-07-31 MED ORDER — XARELTO 20 MG PO TABS
20.0000 mg | ORAL_TABLET | Freq: Every day | ORAL | 3 refills | Status: DC
  Filled 2022-07-31: qty 90, 90d supply, fill #0
  Filled 2022-10-28: qty 90, 90d supply, fill #1
  Filled 2023-01-29: qty 90, 90d supply, fill #2

## 2022-08-11 ENCOUNTER — Other Ambulatory Visit (HOSPITAL_COMMUNITY): Payer: Self-pay

## 2022-08-11 DIAGNOSIS — M9902 Segmental and somatic dysfunction of thoracic region: Secondary | ICD-10-CM | POA: Diagnosis not present

## 2022-08-11 DIAGNOSIS — M9903 Segmental and somatic dysfunction of lumbar region: Secondary | ICD-10-CM | POA: Diagnosis not present

## 2022-08-11 DIAGNOSIS — M9904 Segmental and somatic dysfunction of sacral region: Secondary | ICD-10-CM | POA: Diagnosis not present

## 2022-08-11 DIAGNOSIS — M9901 Segmental and somatic dysfunction of cervical region: Secondary | ICD-10-CM | POA: Diagnosis not present

## 2022-08-20 ENCOUNTER — Other Ambulatory Visit: Payer: Self-pay

## 2022-08-20 DIAGNOSIS — M9901 Segmental and somatic dysfunction of cervical region: Secondary | ICD-10-CM | POA: Diagnosis not present

## 2022-08-20 DIAGNOSIS — M9903 Segmental and somatic dysfunction of lumbar region: Secondary | ICD-10-CM | POA: Diagnosis not present

## 2022-08-20 DIAGNOSIS — M9902 Segmental and somatic dysfunction of thoracic region: Secondary | ICD-10-CM | POA: Diagnosis not present

## 2022-08-20 DIAGNOSIS — M9904 Segmental and somatic dysfunction of sacral region: Secondary | ICD-10-CM | POA: Diagnosis not present

## 2022-08-20 MED ORDER — COVID-19 MRNA 2023-2024 VACCINE (COMIRNATY) 0.3 ML INJECTION
0.3000 mL | Freq: Once | INTRAMUSCULAR | 0 refills | Status: AC
  Filled 2022-08-20: qty 0.3, 1d supply, fill #0

## 2022-09-02 ENCOUNTER — Other Ambulatory Visit (HOSPITAL_COMMUNITY): Payer: Self-pay

## 2022-09-02 MED ORDER — LIDOCAINE 5 % EX PTCH
2.0000 | MEDICATED_PATCH | Freq: Every day | CUTANEOUS | 3 refills | Status: DC
  Filled 2022-09-02: qty 62, 31d supply, fill #0
  Filled 2022-09-02: qty 28, 14d supply, fill #0
  Filled 2022-10-28: qty 30, 15d supply, fill #1
  Filled 2022-10-28: qty 60, 30d supply, fill #1
  Filled 2023-01-29: qty 90, 45d supply, fill #2

## 2022-09-02 MED ORDER — DICYCLOMINE HCL 20 MG PO TABS
20.0000 mg | ORAL_TABLET | ORAL | 0 refills | Status: DC
  Filled 2022-09-02: qty 30, 5d supply, fill #0

## 2022-09-02 MED ORDER — FINASTERIDE 5 MG PO TABS
5.0000 mg | ORAL_TABLET | Freq: Every day | ORAL | 3 refills | Status: DC
  Filled 2022-09-02: qty 82, 82d supply, fill #0
  Filled 2022-09-02: qty 8, 8d supply, fill #0
  Filled 2023-02-25: qty 90, 90d supply, fill #1

## 2022-09-10 ENCOUNTER — Other Ambulatory Visit (HOSPITAL_COMMUNITY): Payer: Self-pay

## 2022-09-10 MED ORDER — FINASTERIDE 5 MG PO TABS
5.0000 mg | ORAL_TABLET | Freq: Every day | ORAL | 0 refills | Status: DC
  Filled 2022-09-10 – 2022-12-05 (×2): qty 90, 90d supply, fill #0

## 2022-09-11 ENCOUNTER — Other Ambulatory Visit (HOSPITAL_COMMUNITY): Payer: Self-pay

## 2022-09-15 DIAGNOSIS — M9901 Segmental and somatic dysfunction of cervical region: Secondary | ICD-10-CM | POA: Diagnosis not present

## 2022-09-15 DIAGNOSIS — M9904 Segmental and somatic dysfunction of sacral region: Secondary | ICD-10-CM | POA: Diagnosis not present

## 2022-09-15 DIAGNOSIS — M9903 Segmental and somatic dysfunction of lumbar region: Secondary | ICD-10-CM | POA: Diagnosis not present

## 2022-09-15 DIAGNOSIS — M9905 Segmental and somatic dysfunction of pelvic region: Secondary | ICD-10-CM | POA: Diagnosis not present

## 2022-09-22 ENCOUNTER — Other Ambulatory Visit (HOSPITAL_COMMUNITY): Payer: Self-pay

## 2022-09-23 DIAGNOSIS — M9905 Segmental and somatic dysfunction of pelvic region: Secondary | ICD-10-CM | POA: Diagnosis not present

## 2022-09-23 DIAGNOSIS — M9904 Segmental and somatic dysfunction of sacral region: Secondary | ICD-10-CM | POA: Diagnosis not present

## 2022-09-23 DIAGNOSIS — M9903 Segmental and somatic dysfunction of lumbar region: Secondary | ICD-10-CM | POA: Diagnosis not present

## 2022-09-23 DIAGNOSIS — M9901 Segmental and somatic dysfunction of cervical region: Secondary | ICD-10-CM | POA: Diagnosis not present

## 2022-09-26 ENCOUNTER — Other Ambulatory Visit (HOSPITAL_COMMUNITY): Payer: Self-pay

## 2022-10-27 DIAGNOSIS — M9904 Segmental and somatic dysfunction of sacral region: Secondary | ICD-10-CM | POA: Diagnosis not present

## 2022-10-27 DIAGNOSIS — M9903 Segmental and somatic dysfunction of lumbar region: Secondary | ICD-10-CM | POA: Diagnosis not present

## 2022-10-27 DIAGNOSIS — M9901 Segmental and somatic dysfunction of cervical region: Secondary | ICD-10-CM | POA: Diagnosis not present

## 2022-10-27 DIAGNOSIS — M9905 Segmental and somatic dysfunction of pelvic region: Secondary | ICD-10-CM | POA: Diagnosis not present

## 2022-10-28 ENCOUNTER — Other Ambulatory Visit (HOSPITAL_COMMUNITY): Payer: Self-pay

## 2022-10-28 MED ORDER — TAMSULOSIN HCL 0.4 MG PO CAPS: 0.4000 mg | ORAL_CAPSULE | Freq: Two times a day (BID) | ORAL | 3 refills | Status: DC

## 2022-10-28 MED ORDER — OXYCODONE HCL 5 MG PO TABS
5.0000 mg | ORAL_TABLET | Freq: Three times a day (TID) | ORAL | 0 refills | Status: DC | PRN
  Filled 2022-10-28: qty 180, 30d supply, fill #0

## 2022-10-28 MED ORDER — DICYCLOMINE HCL 20 MG PO TABS
20.0000 mg | ORAL_TABLET | ORAL | 0 refills | Status: DC
  Filled 2022-10-28: qty 30, 5d supply, fill #0

## 2022-10-28 MED ORDER — TAMSULOSIN HCL 0.4 MG PO CAPS
0.4000 mg | ORAL_CAPSULE | Freq: Two times a day (BID) | ORAL | 4 refills | Status: DC
  Filled 2022-10-28: qty 180, 90d supply, fill #0
  Filled 2023-01-29: qty 180, 90d supply, fill #1

## 2022-10-28 MED ORDER — CYCLOBENZAPRINE HCL 10 MG PO TABS: 10.0000 mg | ORAL_TABLET | Freq: Three times a day (TID) | ORAL | 3 refills | Status: DC | PRN

## 2022-10-28 MED ORDER — TOPIRAMATE 25 MG PO TABS
25.0000 mg | ORAL_TABLET | Freq: Every evening | ORAL | 3 refills | Status: AC
  Filled 2022-10-28: qty 90, 90d supply, fill #0
  Filled 2023-01-29: qty 90, 90d supply, fill #1
  Filled 2023-07-24 – 2023-07-31 (×2): qty 90, 90d supply, fill #0

## 2022-10-28 MED ORDER — PANTOPRAZOLE SODIUM 40 MG PO TBEC
40.0000 mg | DELAYED_RELEASE_TABLET | Freq: Every day | ORAL | 4 refills | Status: AC
  Filled 2022-10-28: qty 90, 90d supply, fill #0
  Filled 2023-01-29: qty 90, 90d supply, fill #1

## 2022-10-28 MED ORDER — MOVANTIK 25 MG PO TABS
25.0000 mg | ORAL_TABLET | ORAL | 3 refills | Status: AC
  Filled 2023-07-24: qty 90, 90d supply, fill #0

## 2022-10-28 MED ORDER — DIAZEPAM 5 MG PO TABS
5.0000 mg | ORAL_TABLET | Freq: Two times a day (BID) | ORAL | 0 refills | Status: DC
  Filled 2022-10-28: qty 180, 90d supply, fill #0

## 2022-10-28 MED ORDER — CYCLOBENZAPRINE HCL 10 MG PO TABS
10.0000 mg | ORAL_TABLET | Freq: Three times a day (TID) | ORAL | 6 refills | Status: AC | PRN
  Filled 2022-10-28: qty 270, 90d supply, fill #0
  Filled 2023-01-29: qty 270, 90d supply, fill #1
  Filled 2023-07-24: qty 270, 90d supply, fill #0

## 2022-10-28 MED ORDER — TRAMADOL HCL 50 MG PO TABS
50.0000 mg | ORAL_TABLET | Freq: Four times a day (QID) | ORAL | 1 refills | Status: DC | PRN
  Filled 2022-10-28: qty 240, 30d supply, fill #0
  Filled 2022-12-05: qty 240, 30d supply, fill #1

## 2022-10-28 MED ORDER — MOVANTIK 25 MG PO TABS
25.0000 mg | ORAL_TABLET | Freq: Every morning | ORAL | 6 refills | Status: AC
  Filled 2022-10-28: qty 30, 30d supply, fill #0
  Filled 2022-12-05 – 2022-12-11 (×2): qty 30, 30d supply, fill #1
  Filled 2023-07-31: qty 30, 30d supply, fill #2

## 2022-10-28 MED ORDER — ZOLPIDEM TARTRATE 10 MG PO TABS
10.0000 mg | ORAL_TABLET | Freq: Every evening | ORAL | 0 refills | Status: DC | PRN
  Filled 2023-02-06: qty 90, 90d supply, fill #0

## 2022-10-28 MED ORDER — DICYCLOMINE HCL 20 MG PO TABS: 20.0000 mg | ORAL_TABLET | ORAL | 0 refills | Status: DC | PRN

## 2022-10-28 MED ORDER — NURTEC 75 MG PO TBDP
75.0000 mg | ORAL_TABLET | Freq: Every day | ORAL | 6 refills | Status: AC | PRN
  Filled 2022-10-28 – 2022-12-18 (×3): qty 10, 30d supply, fill #0
  Filled 2023-01-29: qty 10, 30d supply, fill #1
  Filled 2023-02-25: qty 10, 30d supply, fill #2

## 2022-10-28 MED ORDER — TOPIRAMATE 25 MG PO TABS
25.0000 mg | ORAL_TABLET | Freq: Every day | ORAL | 3 refills | Status: AC
  Filled 2022-10-28 – 2023-07-24 (×2): qty 30, 30d supply, fill #0

## 2022-10-28 MED ORDER — NURTEC 75 MG PO TBDP
75.0000 mg | ORAL_TABLET | Freq: Every day | ORAL | 6 refills | Status: AC | PRN
  Filled 2022-10-28 – 2023-07-24 (×2): qty 10, 30d supply, fill #0

## 2022-10-28 MED ORDER — PANTOPRAZOLE SODIUM 40 MG PO TBEC
40.0000 mg | DELAYED_RELEASE_TABLET | Freq: Every day | ORAL | 3 refills | Status: DC
  Filled 2023-07-24: qty 90, 90d supply, fill #0

## 2022-10-29 ENCOUNTER — Other Ambulatory Visit (HOSPITAL_COMMUNITY): Payer: Self-pay

## 2022-10-31 ENCOUNTER — Other Ambulatory Visit (HOSPITAL_COMMUNITY): Payer: Self-pay

## 2022-11-06 ENCOUNTER — Other Ambulatory Visit (HOSPITAL_COMMUNITY): Payer: Self-pay

## 2022-11-10 ENCOUNTER — Other Ambulatory Visit (HOSPITAL_COMMUNITY): Payer: Self-pay

## 2022-11-10 DIAGNOSIS — R52 Pain, unspecified: Secondary | ICD-10-CM | POA: Diagnosis not present

## 2022-11-10 DIAGNOSIS — M1812 Unilateral primary osteoarthritis of first carpometacarpal joint, left hand: Secondary | ICD-10-CM | POA: Diagnosis not present

## 2022-11-10 MED ORDER — ZOLPIDEM TARTRATE 10 MG PO TABS
10.0000 mg | ORAL_TABLET | Freq: Every evening | ORAL | 0 refills | Status: DC | PRN
  Filled 2022-11-10: qty 90, 90d supply, fill #0

## 2022-11-11 ENCOUNTER — Other Ambulatory Visit (HOSPITAL_COMMUNITY): Payer: Self-pay

## 2022-11-11 DIAGNOSIS — G43909 Migraine, unspecified, not intractable, without status migrainosus: Secondary | ICD-10-CM | POA: Diagnosis not present

## 2022-11-11 DIAGNOSIS — G894 Chronic pain syndrome: Secondary | ICD-10-CM | POA: Diagnosis not present

## 2022-11-11 DIAGNOSIS — E1151 Type 2 diabetes mellitus with diabetic peripheral angiopathy without gangrene: Secondary | ICD-10-CM | POA: Diagnosis not present

## 2022-11-11 DIAGNOSIS — F5104 Psychophysiologic insomnia: Secondary | ICD-10-CM | POA: Diagnosis not present

## 2022-11-11 DIAGNOSIS — Z86711 Personal history of pulmonary embolism: Secondary | ICD-10-CM | POA: Diagnosis not present

## 2022-11-11 DIAGNOSIS — Z7901 Long term (current) use of anticoagulants: Secondary | ICD-10-CM | POA: Diagnosis not present

## 2022-11-11 DIAGNOSIS — I739 Peripheral vascular disease, unspecified: Secondary | ICD-10-CM | POA: Diagnosis not present

## 2022-11-11 DIAGNOSIS — E785 Hyperlipidemia, unspecified: Secondary | ICD-10-CM | POA: Diagnosis not present

## 2022-11-11 DIAGNOSIS — R972 Elevated prostate specific antigen [PSA]: Secondary | ICD-10-CM | POA: Diagnosis not present

## 2022-11-12 ENCOUNTER — Other Ambulatory Visit (HOSPITAL_COMMUNITY): Payer: Self-pay

## 2022-11-12 DIAGNOSIS — H5213 Myopia, bilateral: Secondary | ICD-10-CM | POA: Diagnosis not present

## 2022-11-12 DIAGNOSIS — E119 Type 2 diabetes mellitus without complications: Secondary | ICD-10-CM | POA: Diagnosis not present

## 2022-11-12 DIAGNOSIS — H2513 Age-related nuclear cataract, bilateral: Secondary | ICD-10-CM | POA: Diagnosis not present

## 2022-11-12 DIAGNOSIS — H524 Presbyopia: Secondary | ICD-10-CM | POA: Diagnosis not present

## 2022-11-12 MED ORDER — NALOXONE HCL 4 MG/0.1ML NA LIQD
4.0000 mg | Freq: Every day | NASAL | 1 refills | Status: AC | PRN
  Filled 2022-11-12: qty 2, 2d supply, fill #0
  Filled 2022-11-19: qty 2, 30d supply, fill #0

## 2022-11-13 ENCOUNTER — Other Ambulatory Visit: Payer: Self-pay | Admitting: Internal Medicine

## 2022-11-13 ENCOUNTER — Other Ambulatory Visit (HOSPITAL_COMMUNITY): Payer: Self-pay

## 2022-11-13 DIAGNOSIS — G894 Chronic pain syndrome: Secondary | ICD-10-CM

## 2022-11-17 ENCOUNTER — Other Ambulatory Visit (HOSPITAL_COMMUNITY): Payer: Self-pay | Admitting: Internal Medicine

## 2022-11-17 DIAGNOSIS — G894 Chronic pain syndrome: Secondary | ICD-10-CM

## 2022-11-19 ENCOUNTER — Other Ambulatory Visit (HOSPITAL_COMMUNITY): Payer: Self-pay

## 2022-11-19 DIAGNOSIS — M9902 Segmental and somatic dysfunction of thoracic region: Secondary | ICD-10-CM | POA: Diagnosis not present

## 2022-11-19 DIAGNOSIS — M9901 Segmental and somatic dysfunction of cervical region: Secondary | ICD-10-CM | POA: Diagnosis not present

## 2022-11-19 DIAGNOSIS — M9903 Segmental and somatic dysfunction of lumbar region: Secondary | ICD-10-CM | POA: Diagnosis not present

## 2022-11-19 DIAGNOSIS — M9904 Segmental and somatic dysfunction of sacral region: Secondary | ICD-10-CM | POA: Diagnosis not present

## 2022-12-01 ENCOUNTER — Other Ambulatory Visit (HOSPITAL_COMMUNITY): Payer: Self-pay

## 2022-12-04 ENCOUNTER — Ambulatory Visit (HOSPITAL_COMMUNITY)
Admission: RE | Admit: 2022-12-04 | Discharge: 2022-12-04 | Disposition: A | Discharge status: Home / Self Care | Payer: 59 | Source: Ambulatory Visit | Attending: Internal Medicine | Admitting: Internal Medicine

## 2022-12-04 DIAGNOSIS — G894 Chronic pain syndrome: Secondary | ICD-10-CM | POA: Diagnosis not present

## 2022-12-04 DIAGNOSIS — M542 Cervicalgia: Secondary | ICD-10-CM | POA: Diagnosis not present

## 2022-12-04 DIAGNOSIS — M4802 Spinal stenosis, cervical region: Secondary | ICD-10-CM | POA: Diagnosis not present

## 2022-12-04 DIAGNOSIS — M5126 Other intervertebral disc displacement, lumbar region: Secondary | ICD-10-CM | POA: Diagnosis not present

## 2022-12-05 ENCOUNTER — Other Ambulatory Visit (HOSPITAL_COMMUNITY): Payer: Self-pay

## 2022-12-08 ENCOUNTER — Other Ambulatory Visit: Payer: Self-pay

## 2022-12-08 ENCOUNTER — Other Ambulatory Visit (HOSPITAL_COMMUNITY): Payer: Self-pay

## 2022-12-08 MED ORDER — DICYCLOMINE HCL 20 MG PO TABS
20.0000 mg | ORAL_TABLET | ORAL | 0 refills | Status: DC
  Filled 2022-12-08: qty 30, 5d supply, fill #0

## 2022-12-11 ENCOUNTER — Other Ambulatory Visit (HOSPITAL_COMMUNITY): Payer: Self-pay

## 2022-12-11 DIAGNOSIS — M9903 Segmental and somatic dysfunction of lumbar region: Secondary | ICD-10-CM | POA: Diagnosis not present

## 2022-12-11 DIAGNOSIS — M9901 Segmental and somatic dysfunction of cervical region: Secondary | ICD-10-CM | POA: Diagnosis not present

## 2022-12-11 DIAGNOSIS — M9902 Segmental and somatic dysfunction of thoracic region: Secondary | ICD-10-CM | POA: Diagnosis not present

## 2022-12-11 DIAGNOSIS — M9904 Segmental and somatic dysfunction of sacral region: Secondary | ICD-10-CM | POA: Diagnosis not present

## 2022-12-11 MED ORDER — MOUNJARO 10 MG/0.5ML ~~LOC~~ SOAJ
10.0000 mg | SUBCUTANEOUS | 1 refills | Status: DC
  Filled 2022-12-11: qty 6, 84d supply, fill #0
  Filled 2023-01-29: qty 2, 28d supply, fill #0

## 2022-12-17 ENCOUNTER — Other Ambulatory Visit (HOSPITAL_COMMUNITY): Payer: Self-pay

## 2022-12-18 ENCOUNTER — Other Ambulatory Visit (HOSPITAL_COMMUNITY): Payer: Self-pay

## 2022-12-23 DIAGNOSIS — M9903 Segmental and somatic dysfunction of lumbar region: Secondary | ICD-10-CM | POA: Diagnosis not present

## 2022-12-23 DIAGNOSIS — M9904 Segmental and somatic dysfunction of sacral region: Secondary | ICD-10-CM | POA: Diagnosis not present

## 2022-12-23 DIAGNOSIS — M9902 Segmental and somatic dysfunction of thoracic region: Secondary | ICD-10-CM | POA: Diagnosis not present

## 2022-12-23 DIAGNOSIS — M9901 Segmental and somatic dysfunction of cervical region: Secondary | ICD-10-CM | POA: Diagnosis not present

## 2023-01-02 DIAGNOSIS — M5136 Other intervertebral disc degeneration, lumbar region: Secondary | ICD-10-CM | POA: Diagnosis not present

## 2023-01-02 DIAGNOSIS — M542 Cervicalgia: Secondary | ICD-10-CM | POA: Diagnosis not present

## 2023-01-15 DIAGNOSIS — M9904 Segmental and somatic dysfunction of sacral region: Secondary | ICD-10-CM | POA: Diagnosis not present

## 2023-01-15 DIAGNOSIS — M9903 Segmental and somatic dysfunction of lumbar region: Secondary | ICD-10-CM | POA: Diagnosis not present

## 2023-01-15 DIAGNOSIS — M9902 Segmental and somatic dysfunction of thoracic region: Secondary | ICD-10-CM | POA: Diagnosis not present

## 2023-01-15 DIAGNOSIS — M9901 Segmental and somatic dysfunction of cervical region: Secondary | ICD-10-CM | POA: Diagnosis not present

## 2023-01-16 DIAGNOSIS — N401 Enlarged prostate with lower urinary tract symptoms: Secondary | ICD-10-CM | POA: Diagnosis not present

## 2023-01-16 DIAGNOSIS — R3916 Straining to void: Secondary | ICD-10-CM | POA: Diagnosis not present

## 2023-01-16 DIAGNOSIS — R3911 Hesitancy of micturition: Secondary | ICD-10-CM | POA: Diagnosis not present

## 2023-01-16 DIAGNOSIS — R3912 Poor urinary stream: Secondary | ICD-10-CM | POA: Diagnosis not present

## 2023-01-26 ENCOUNTER — Other Ambulatory Visit (HOSPITAL_COMMUNITY): Payer: Self-pay

## 2023-01-26 MED ORDER — AMOXICILLIN 500 MG PO CAPS
ORAL_CAPSULE | ORAL | 0 refills | Status: DC
  Filled 2023-01-26: qty 40, 10d supply, fill #0

## 2023-01-29 ENCOUNTER — Other Ambulatory Visit (HOSPITAL_COMMUNITY): Payer: Self-pay

## 2023-01-29 DIAGNOSIS — M5416 Radiculopathy, lumbar region: Secondary | ICD-10-CM | POA: Diagnosis not present

## 2023-01-29 MED ORDER — ROSUVASTATIN CALCIUM 20 MG PO TABS
20.0000 mg | ORAL_TABLET | Freq: Every day | ORAL | 3 refills | Status: DC
  Filled 2023-01-29 – 2023-07-31 (×3): qty 90, 90d supply, fill #0
  Filled 2023-11-09: qty 90, 90d supply, fill #1

## 2023-01-29 MED ORDER — DIAZEPAM 5 MG PO TABS
5.0000 mg | ORAL_TABLET | Freq: Two times a day (BID) | ORAL | 0 refills | Status: DC
  Filled 2023-01-29: qty 180, 90d supply, fill #0

## 2023-01-29 MED ORDER — TRAMADOL HCL 50 MG PO TABS
50.0000 mg | ORAL_TABLET | Freq: Four times a day (QID) | ORAL | 1 refills | Status: DC | PRN
  Filled 2023-01-29 – 2023-07-27 (×3): qty 240, 30d supply, fill #0

## 2023-01-29 MED ORDER — DICYCLOMINE HCL 20 MG PO TABS
20.0000 mg | ORAL_TABLET | ORAL | 0 refills | Status: DC
  Filled 2023-01-29: qty 30, 5d supply, fill #0

## 2023-01-29 MED ORDER — MOUNJARO 10 MG/0.5ML ~~LOC~~ SOAJ
10.0000 mg | SUBCUTANEOUS | 1 refills | Status: DC
  Filled 2023-01-29 – 2023-02-27 (×3): qty 2, 28d supply, fill #0
  Filled 2023-05-20: qty 6, 84d supply, fill #0
  Filled 2023-06-08 (×2): qty 2, 28d supply, fill #0
  Filled 2023-06-10: qty 6, 84d supply, fill #0
  Filled ????-??-??: fill #0

## 2023-01-29 MED ORDER — OXYCODONE HCL 5 MG PO TABS
5.0000 mg | ORAL_TABLET | Freq: Three times a day (TID) | ORAL | 0 refills | Status: DC | PRN
  Filled 2023-01-29: qty 180, 30d supply, fill #0

## 2023-01-30 ENCOUNTER — Other Ambulatory Visit (HOSPITAL_COMMUNITY): Payer: Self-pay

## 2023-02-02 DIAGNOSIS — M9903 Segmental and somatic dysfunction of lumbar region: Secondary | ICD-10-CM | POA: Diagnosis not present

## 2023-02-02 DIAGNOSIS — Z86711 Personal history of pulmonary embolism: Secondary | ICD-10-CM | POA: Diagnosis not present

## 2023-02-02 DIAGNOSIS — G894 Chronic pain syndrome: Secondary | ICD-10-CM | POA: Diagnosis not present

## 2023-02-02 DIAGNOSIS — Z7901 Long term (current) use of anticoagulants: Secondary | ICD-10-CM | POA: Diagnosis not present

## 2023-02-02 DIAGNOSIS — M9901 Segmental and somatic dysfunction of cervical region: Secondary | ICD-10-CM | POA: Diagnosis not present

## 2023-02-02 DIAGNOSIS — M9902 Segmental and somatic dysfunction of thoracic region: Secondary | ICD-10-CM | POA: Diagnosis not present

## 2023-02-02 DIAGNOSIS — E1151 Type 2 diabetes mellitus with diabetic peripheral angiopathy without gangrene: Secondary | ICD-10-CM | POA: Diagnosis not present

## 2023-02-02 DIAGNOSIS — I739 Peripheral vascular disease, unspecified: Secondary | ICD-10-CM | POA: Diagnosis not present

## 2023-02-02 DIAGNOSIS — M9904 Segmental and somatic dysfunction of sacral region: Secondary | ICD-10-CM | POA: Diagnosis not present

## 2023-02-02 DIAGNOSIS — E785 Hyperlipidemia, unspecified: Secondary | ICD-10-CM | POA: Diagnosis not present

## 2023-02-02 DIAGNOSIS — I10 Essential (primary) hypertension: Secondary | ICD-10-CM | POA: Diagnosis not present

## 2023-02-04 ENCOUNTER — Ambulatory Visit: Payer: 59 | Attending: Neurosurgery

## 2023-02-04 ENCOUNTER — Other Ambulatory Visit: Payer: Self-pay

## 2023-02-04 ENCOUNTER — Ambulatory Visit

## 2023-02-04 DIAGNOSIS — M5441 Lumbago with sciatica, right side: Secondary | ICD-10-CM | POA: Insufficient documentation

## 2023-02-04 DIAGNOSIS — M542 Cervicalgia: Secondary | ICD-10-CM | POA: Insufficient documentation

## 2023-02-04 DIAGNOSIS — M6281 Muscle weakness (generalized): Secondary | ICD-10-CM | POA: Diagnosis not present

## 2023-02-04 DIAGNOSIS — G8929 Other chronic pain: Secondary | ICD-10-CM | POA: Diagnosis not present

## 2023-02-04 NOTE — Therapy (Signed)
OUTPATIENT PHYSICAL THERAPY EVALUATION   Patient Name: Daniel ORVIS, MD MRN: [id number] DOB:17-Apr-1961,61 y.o., male 67 Date: 02/05/2023   END OF SESSION:  PT End of Session - 02/04/23 1352     Visit Number 1    Number of Visits 17    Date for PT Re-Evaluation 04/04/23    Authorization Type [employer]    PT Start Time 1400    PT Stop Time 1445    PT Time Calculation (min) 45 min    Activity Tolerance Patient tolerated treatment well    Behavior During Therapy WFL for tasks assessed/performed              Past Medical History:  Diagnosis Date   Anxiety    Aortic atherosclerosis    BPH (benign prostatic hyperplasia)    Chronic pain syndrome    neck and low back   DDD (degenerative disc disease), cervical    Diverticulosis    DJD (degenerative joint disease)    Ganglion cyst    12 mm , right posterior knee   Hyperlipidemia    Internal hemorrhoids    Migraines    OA (osteoarthritis)    knees, hands, right shoulder   Positive QuantiFERON-TB Gold test    Shoulder pain    Tear of right rotator cuff    Type 2 diabetes mellitus    last A1c 5.4 on 04-13-2018 in epic (followed by pcp)   Wears glasses    Past Surgical History:  Procedure Laterality Date   ANAL FISSURE REPAIR     ARTHOSCOPIC ROTAOR CUFF REPAIR Right 06/15/2018   Procedure: RIGHT SHOULDER ARTHROSCOPY, DEBRIDEMENT, SUBACROMIAL DECOMPRESSION, DISTAL CLAVICLE RESECTION, ROTATOR CUFF REPAIR;  Surgeon: Eugenia Mcalpine, MD;  Location: [facility];  Service: Orthopedics;  Laterality: Right;   COLONOSCOPY  05-26-2017   dr Dineen Kid SIGMOIDOSCOPY N/A 11/05/2021   Procedure: FLEXIBLE SIGMOIDOSCOPY;  Surgeon: Rachael Fee, MD;  Location: [facility];  Service: Endoscopy;  Laterality: N/A;   Hip Resurface  2006   KNEE ARTHROSCOPY W/ MENISCAL REPAIR Bilateral right 1993; left 2010   MASS EXCISION Right 04/21/2018   Procedure: EXCISION RIGHT THIGH SOFT TISSUE MASS;  Surgeon: Ollen Gross, MD;  Location: [facility];  Service: Orthopedics;  Laterality: Right;   SEPTOPLASTY     SHOULDER ARTHROSCOPY WITH ROTATOR CUFF REPAIR Left    SLAP Tear Repair  2008   TONSILLECTOMY     TOTAL KNEE ARTHROPLASTY Left 12/08/2016   Procedure: LEFT TOTAL KNEE ARTHROPLASTY;  Surgeon: Ollen Gross, MD;  Location: [facility];  Service: Orthopedics;  Laterality: Left;   TOTAL KNEE ARTHROPLASTY WITH REVISION COMPONENTS Left 04/21/2018   Procedure: Left knee polyethylene exchange ;  Surgeon: Ollen Gross, MD;  Location: [facility];  Service: Orthopedics;  Laterality: Left;   Patient Active Problem List   Diagnosis Date Noted   Rectal bleed 11/10/2021   BRBPR (bright red blood per rectum) 11/09/2021   Controlled type 2 diabetes mellitus without complication, without long-term current use of insulin 11/09/2021   Hyperlipidemia 11/09/2021   Chronic musculoskeletal pain 11/09/2021   Oropharyngeal candidiasis 11/09/2021   Hoarse voice quality 11/09/2021   Near syncope 11/09/2021   Acute lower GI bleeding 11/08/2021   Right shoulder injury 06/15/2018   S/P arthroscopy of right shoulder 06/15/2018   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   OA (osteoarthritis) of knee 12/08/2016   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 without status migrainosus 12/17/2015   Cervical facet joint syndrome  12/17/2015   Chronic bilateral low back pain without sciatica 12/17/2015   Primary osteoarthritis of left knee 12/17/2015    PCP:   Garlan Fillers, MD    REFERRING PROVIDER: Tressie Stalker, MD   REFERRING DIAG:  M51.36 (ICD-10-CM) - Other intervertebral disc degeneration, lumbar region  M54.2 (ICD-10-CM) - Cervicalgia    Rationale for Evaluation and Treatment: Rehabilitation  THERAPY DIAG:  Cervicalgia  Chronic low back pain with right-sided sciatica, unspecified back pain laterality  Muscle weakness (generalized)  ONSET DATE: chronic   SUBJECTIVE:                                                                                                                                                                                            SUBJECTIVE STATEMENT: Patient reports he always has pain in the Rt neck that travels down and gets spasms in the Rt upper trap. This is also causing migraines. He reports multiple herniated discs. In his lumbar region it's the same type of thing with herniated disc and facet degeneration. He gets a lot of spasms in the Rt low back. On bad days he was noticing some Rt foot drop around December.The foot drop is now intermitent, but correlates to days when he has more pain. He reports occasional numbness along the Rt posterolateral thigh and courses to the dorsum of the foot. He had a recent lumbar injection and reports this provides temporary relief. Patient reports his pain has been ongoing for at least 12 years. No changes in bowel/bladder. No red flag symptoms.   PERTINENT HISTORY:  Anxiety Chronic pain syndrome Degenerative disc disease Migraines Diabetes  Morbid obesity History of pulmonary embolism Recurrent DVTS, chronic anticoagulation     PAIN:  Are you having pain? Yes: NPRS scale: 4/10 Pain location: low back Pain description: muscle spasm Aggravating factors: sitting upright, standing, walking Relieving factors: chripractor, massage  Are you having pain? Yes: NPRS scale: 4/10 Pain location: neck Pain description: tight band Aggravating factors: working at the computer Relieving factors: ice, medication   PRECAUTIONS: None  WEIGHT BEARING RESTRICTIONS: No  FALLS:  Has patient fallen in last 6 months? No  LIVING ENVIRONMENT: Lives with: lives with their family Lives in: House/apartment Stairs: Yes: Internal: 14 steps; on left going up Has following equipment at home: None  OCCUPATION: MD  PLOF: Independent  PATIENT GOALS: "I want to be back to where I was before I was in the [employer]."    OBJECTIVE:    DIAGNOSTIC FINDINGS:  Lumbar MRI: IMPRESSION: 1. No acute osseous abnormality in the lumbar spine. 2. Intermittent lower thoracic and lumbar chronic disc  and endplate degeneration. Mild lumbar facet hypertrophy. No lumbar spinal stenosis or convincing lumbar neural impingement. Mild to moderate bilateral T10 and right T11 neural foraminal stenosis.  Cervical MRI: IMPRESSION: 1. No acute osseous abnormality. Multilevel cervical spine degeneration but capacious spinal canal. No spinal stenosis. 2. Generally mild cervical neural foraminal stenosis due to facet and/or disc osteophyte degeneration. Up to moderate left C8 nerve level foraminal stenosis.  PATIENT SURVEYS:  FOTO Neck 57% function to 61% predicted;  lumbar 46% function to 51% predicted   SCREENING FOR RED FLAGS: Bowel or bladder incontinence: No Spinal tumors: No Cauda equina syndrome: No Compression fracture: No Abdominal aneurysm: No  COGNITION: Overall cognitive status: Within functional limits for tasks assessed     SENSATION: WFL  MUSCLE LENGTH: Hamstrings: moderate tightness bilaterally   POSTURE: rounded shoulders, forward head, and decreased lumbar lordosis  PALPATION: TTP cervical paraspinals, Rt upper trap, lumbar paraspinals   LUMBAR ROM:   AROM eval  Flexion WNL  Extension 50% limited   Right lateral flexion WNL  Left lateral flexion WNL  Right rotation WNL  Left rotation WNL   (Blank rows = not tested) pain with all lumbar AROM, most noted with extension   LOWER EXTREMITY ROM:     Active  Right eval Left eval  Hip flexion    Hip extension    Hip abduction    Hip adduction    Hip internal rotation    Hip external rotation    Knee flexion    Knee extension    Ankle dorsiflexion    Ankle plantarflexion    Ankle inversion    Ankle eversion     (Blank rows = not tested)  LOWER EXTREMITY MMT:    MMT Right eval Left eval  Hip flexion 4- 5  Hip extension 4 4  Hip  abduction 4 4  Hip adduction    Hip internal rotation    Hip external rotation    Knee flexion 4+   Knee extension 5 5  Ankle dorsiflexion 4+ 5  Ankle plantarflexion 5 in sitting 5  Ankle inversion    Ankle eversion 4+ 5   (Blank rows = not tested) CERVICAL ROM:   Active ROM A/PROM (deg) eval  Flexion 40  Extension 50  Right lateral flexion 20  Left lateral flexion 30  Right rotation 62  Left rotation 50   (Blank rows = not tested) pain with all cervical AROM UPPER EXTREMITY MMT:  MMT Right eval Left eval  Shoulder flexion 4 5  Shoulder extension    Shoulder abduction 5 5  Shoulder adduction    Shoulder extension    Shoulder internal rotation    Shoulder external rotation    Middle trapezius 4 4  Lower trapezius    Elbow flexion    Elbow extension    Wrist flexion    Wrist extension    Wrist ulnar deviation    Wrist radial deviation    Wrist pronation    Wrist supination    Grip strength     (Blank rows = not tested)  SPECIAL TESTS:  (+) Cervical compression/distraction  (+) SLR  FUNCTIONAL TESTS:  Squat: WBOS, poor hinge; increased pain    GAIT: Distance walked: 10 ft  Assistive device utilized: None Level of assistance: Complete Independence Comments: WNL  [facility] Adult PT Treatment:  DATE: 02/04/23  Therapeutic Exercise: Demonstrated and issued initial HEP.    Therapeutic Activity: Education on assessment findings that will be addressed throughout duration of POC.       PATIENT EDUCATION:  Education details: see treatment Person educated: Patient Education method: Explanation, Demonstration, Tactile cues, Verbal cues, and Handouts Education comprehension: verbalized understanding, returned demonstration, verbal cues required, tactile cues required, and needs further education  HOME EXERCISE PROGRAM: Access Code: [email or web address] URL: [email or web address] Date: 02/04/2023 Prepared  by: Letitia Libra  Exercises - Supine Double Knee to Chest  - 2 x daily - 7 x weekly - 1 sets - 10 reps - 5 sec  hold - Hooklying Clamshell with Resistance  - 1 x daily - 7 x weekly - 2 sets - 10 reps - Supine Chin Tuck  - 2 x daily - 7 x weekly - 2 sets - 10 reps  ASSESSMENT:  CLINICAL IMPRESSION: Patient is a 62 y.o. male well known to our clinic who was seen today for physical therapy evaluation and treatment for chronic neck and back pain that has been ongoing for years. Upon assessment he is noted to have slightly limited cervical AROM reporting pain with all ranges and limited and painful lumbar extension AROM, bilateral hip weakness, Rt knee and ankle weakness, postural abnormalities, and aberrant lifting mechanics. He will benefit from skilled PT to address the above stated deficits in order to optimize his function and assist in overall pain reduction.   OBJECTIVE IMPAIRMENTS: decreased activity tolerance, decreased endurance, difficulty walking, decreased ROM, decreased strength, increased fascial restrictions, impaired UE functional use, improper body mechanics, postural dysfunction, and pain.   ACTIVITY LIMITATIONS: carrying, lifting, bending, sitting, standing, squatting, and locomotion level  PARTICIPATION LIMITATIONS: meal prep, cleaning, laundry, driving, shopping, community activity, and yard work  PERSONAL FACTORS: Age, Fitness, Profession, Time since onset of injury/illness/exacerbation, and 3+ comorbidities: see PMH above  are also affecting patient's functional outcome.   REHAB POTENTIAL: Fair chronicity of injury, co-morbidities, previous PT  CLINICAL DECISION MAKING: Evolving/moderate complexity  EVALUATION COMPLEXITY: Moderate   GOALS: Goals reviewed with patient? Yes  SHORT TERM GOALS: Target date: 03/04/2023    Patient will be independent and compliant with initial HEP.   Baseline: see above Goal status: INITIAL  2.  Patient will demonstrate at least 60  degrees of left cervical rotation AROM to improve ability to complete head turns while driving.  Baseline: see above  Goal status: INITIAL  3.  Patient will demonstrate proper hip hinge to reduce stress on back with lifting activity.  Baseline: see above  Goal status: INITIAL  LONG TERM GOALS: Target date: 04/04/2023    Patient will score at least 61% neck and 51% lumbar on FOTO to signify clinically meaningful improvement in functional abilities.   Baseline: see above Goal status: INITIAL  2.  Patient will be independent with advanced home program to assist in management of his chronic conditions.  Baseline: see above Goal status: INITIAL  3.  Patient will demonstrate 5/5 hip strength to improve stability about the chain with prolonged walking/standing activity.  Baseline: see above Goal status: INITIAL  4.  Patient will be able to lift at least 20 lbs with minimal pain.  Baseline: see above Goal status: INITIAL  5. Patient will demonstrate 5/5 bilateral middle trap strength to improve postural stability.   Baseline:see above  Goal status: INITIAL   PLAN:  PT FREQUENCY: 1-2x/week  PT DURATION: 8 weeks  PLANNED INTERVENTIONS: Therapeutic exercises, Therapeutic activity, Neuromuscular  re-education, Balance training, Gait training, Patient/Family education, Self Care, Aquatic Therapy, Dry Needling, Electrical stimulation, Cryotherapy, Moist heat, Traction, Manual therapy, and Re-evaluation.  PLAN FOR NEXT SESSION: review and progress HEP prn; cervical and lumbar mobility, postural and core strengthening.   Letitia Libra, PT, DPT, ATC 02/05/23 8:37 AM

## 2023-02-04 NOTE — Patient Instructions (Signed)
Aquatic Therapy at [facility]-  What to Expect!  Where:   [facility] [address] [place], [place] [address] Rehab phone [phone]  NOTE:  You will receive an automated phone message reminding you of your appt and it will say the appointment is at the [address] [facility] [address] [facility] clinic.          How to Prepare: Please make sure you drink 8 ounces of water about one hour prior to your pool session A caregiver may attend if needed with the patient to help assist as needed. A caregiver can sit in the pool room on chair. Please arrive IN YOUR SUIT and 15 minutes prior to your appointment - this helps to avoid delays in starting your session. Please make sure to attend to any toileting needs prior to entering the pool Locker rooms for changing are provided.   There is direct access to the pool deck form the locker room.  You can lock your belongings in a locker with lock provided. Once on the pool deck your therapist will ask if you have signed the Patient  Consent and Assignment of Benefits form before beginning treatment Your therapist may take your blood pressure prior to, during and after your session if indicated We usually try and create a home exercise program based on activities we do in the pool.  Please be thinking about who might be able to assist you in the pool should you need to participate in an aquatic home exercise program at the time of discharge if you need assistance.  Some patients do not want to or do not have the ability to participate in an aquatic home program - this is not a barrier in any way to you participating in aquatic therapy as part of your current therapy plan! After Discharge from PT, you can continue using home program at  the [facility]/, there is a drop-in fee for $5 ($45 a month)or for 60 years  or older $4.00 ($40 a month for seniors ) or any local [facility] pool.  Memberships for purchase are  available for gym/pool at [facility]  IT IS VERY IMPORTANT THAT YOUR LAST VISIT BE IN THE CLINIC AT [address] AFTER YOUR LAST AQUATIC VISIT.  PLEASE MAKE SURE THAT YOU HAVE A [address]  APPOINTMENT SCHEDULED.   About the pool: Pool is located approximately 500 FT from the entrance of the building.  Please bring a support person if you need assistance traveling this      distance.   Your therapist will assist you in entering the water; there are two ways to           enter: stairs with railings, and a mechanical lift. Your therapist will determine the most appropriate way for you.  Water temperature is usually between 88-90 degrees  There may be up to 2 other swimmers in the pool at the same time  The pool deck is tile, please wear shoes with good traction if you prefer not to be barefoot.    Contact Info:  For appointment scheduling and cancellations:         Please call the [facility]  PH:[phone]              Aquatic Therapy  [facility]patient [facility]       All sessions are 45 minutes                                                    

## 2023-02-06 ENCOUNTER — Other Ambulatory Visit (HOSPITAL_COMMUNITY): Payer: Self-pay

## 2023-02-10 ENCOUNTER — Other Ambulatory Visit (HOSPITAL_COMMUNITY): Payer: Self-pay

## 2023-02-10 DIAGNOSIS — R3911 Hesitancy of micturition: Secondary | ICD-10-CM | POA: Diagnosis not present

## 2023-02-10 DIAGNOSIS — N5201 Erectile dysfunction due to arterial insufficiency: Secondary | ICD-10-CM | POA: Diagnosis not present

## 2023-02-10 DIAGNOSIS — R972 Elevated prostate specific antigen [PSA]: Secondary | ICD-10-CM | POA: Diagnosis not present

## 2023-02-10 DIAGNOSIS — R351 Nocturia: Secondary | ICD-10-CM | POA: Diagnosis not present

## 2023-02-10 MED ORDER — SILODOSIN 8 MG PO CAPS
8.0000 mg | ORAL_CAPSULE | Freq: Two times a day (BID) | ORAL | 1 refills | Status: DC
  Filled 2023-02-10 – 2023-07-24 (×2): qty 180, 90d supply, fill #0

## 2023-02-11 ENCOUNTER — Other Ambulatory Visit (HOSPITAL_COMMUNITY): Payer: Self-pay

## 2023-02-25 ENCOUNTER — Other Ambulatory Visit (HOSPITAL_COMMUNITY): Payer: Self-pay

## 2023-02-26 ENCOUNTER — Encounter: Payer: Self-pay | Admitting: Physical Therapy

## 2023-02-26 ENCOUNTER — Ambulatory Visit: Payer: 59 | Attending: Neurosurgery | Admitting: Physical Therapy

## 2023-02-26 DIAGNOSIS — M6281 Muscle weakness (generalized): Secondary | ICD-10-CM

## 2023-02-26 DIAGNOSIS — M5441 Lumbago with sciatica, right side: Secondary | ICD-10-CM | POA: Insufficient documentation

## 2023-02-26 DIAGNOSIS — G8929 Other chronic pain: Secondary | ICD-10-CM | POA: Diagnosis not present

## 2023-02-26 DIAGNOSIS — M542 Cervicalgia: Secondary | ICD-10-CM | POA: Insufficient documentation

## 2023-02-26 NOTE — Therapy (Signed)
OUTPATIENT PHYSICAL THERAPY TREATMENT   Patient Name: Daniel RUMPF, MD MRN: [id number] DOB:1960-10-21,62 y.o., male Today's Date: 02/26/2023   END OF SESSION:  PT End of Session - 02/26/23 1147     Visit Number 2    Number of Visits 17    Date for PT Re-Evaluation 04/04/23    Authorization Type [employer]    PT Start Time 1148    PT Stop Time 1232    PT Time Calculation (min) 44 min    Activity Tolerance Patient tolerated treatment well    Behavior During Therapy WFL for tasks assessed/performed               Past Medical History:  Diagnosis Date   Anxiety    Aortic atherosclerosis (HCC)    BPH (benign prostatic hyperplasia)    Chronic pain syndrome    neck and low back   DDD (degenerative disc disease), cervical    Diverticulosis    DJD (degenerative joint disease)    Ganglion cyst    12 mm , right posterior knee   Hyperlipidemia    Internal hemorrhoids    Migraines    OA (osteoarthritis)    knees, hands, right shoulder   Positive QuantiFERON-TB Gold test    Shoulder pain    Tear of right rotator cuff    Type 2 diabetes mellitus (HCC)    last A1c 5.4 on 04-13-2018 in epic (followed by pcp)   Wears glasses    Past Surgical History:  Procedure Laterality Date   ANAL FISSURE REPAIR     ARTHOSCOPIC ROTAOR CUFF REPAIR Right 06/15/2018   Procedure: RIGHT SHOULDER ARTHROSCOPY, DEBRIDEMENT, SUBACROMIAL DECOMPRESSION, DISTAL CLAVICLE RESECTION, ROTATOR CUFF REPAIR;  Surgeon: Eugenia Mcalpine, MD;  Location: [facility];  Service: Orthopedics;  Laterality: Right;   COLONOSCOPY  05-26-2017   dr Dineen Kid SIGMOIDOSCOPY N/A 11/05/2021   Procedure: FLEXIBLE SIGMOIDOSCOPY;  Surgeon: Rachael Fee, MD;  Location: [facility];  Service: Endoscopy;  Laterality: N/A;   Hip Resurface  2006   KNEE ARTHROSCOPY W/ MENISCAL REPAIR Bilateral right 1993; left 2010   MASS EXCISION Right 04/21/2018   Procedure: EXCISION RIGHT THIGH SOFT TISSUE MASS;  Surgeon:  Ollen Gross, MD;  Location: [facility];  Service: Orthopedics;  Laterality: Right;   SEPTOPLASTY     SHOULDER ARTHROSCOPY WITH ROTATOR CUFF REPAIR Left    SLAP Tear Repair  2008   TONSILLECTOMY     TOTAL KNEE ARTHROPLASTY Left 12/08/2016   Procedure: LEFT TOTAL KNEE ARTHROPLASTY;  Surgeon: Ollen Gross, MD;  Location: [facility];  Service: Orthopedics;  Laterality: Left;   TOTAL KNEE ARTHROPLASTY WITH REVISION COMPONENTS Left 04/21/2018   Procedure: Left knee polyethylene exchange ;  Surgeon: Ollen Gross, MD;  Location: [facility];  Service: Orthopedics;  Laterality: Left;   Patient Active Problem List   Diagnosis Date Noted   Rectal bleed 11/10/2021   BRBPR (bright red blood per rectum) 11/09/2021   Controlled type 2 diabetes mellitus without complication, without long-term current use of insulin (HCC) 11/09/2021   Hyperlipidemia 11/09/2021   Chronic musculoskeletal pain 11/09/2021   Oropharyngeal candidiasis 11/09/2021   Hoarse voice quality 11/09/2021   Near syncope 11/09/2021   Acute lower GI bleeding 11/08/2021   Right shoulder injury 06/15/2018   S/P arthroscopy of right shoulder 06/15/2018   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   OA (osteoarthritis) of knee 12/08/2016   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 without status migrainosus 12/17/2015  Cervical facet joint syndrome 12/17/2015   Chronic bilateral low back pain without sciatica 12/17/2015   Primary osteoarthritis of left knee 12/17/2015    PCP:   Garlan Fillers, MD    REFERRING PROVIDER: Tressie Stalker, MD   REFERRING DIAG:  M51.36 (ICD-10-CM) - Other intervertebral disc degeneration, lumbar region  M54.2 (ICD-10-CM) - Cervicalgia    Rationale for Evaluation and Treatment: Rehabilitation  THERAPY DIAG:  Cervicalgia  Chronic low back pain with right-sided sciatica, unspecified back pain laterality  Muscle weakness (generalized)  ONSET DATE: chronic   SUBJECTIVE:                                                                                                                                                                                            SUBJECTIVE STATEMENT: "The neck and the back are still bothering me and is much worse than when I was here before."    PERTINENT HISTORY:  Anxiety Chronic pain syndrome Degenerative disc disease Migraines Diabetes  Morbid obesity History of pulmonary embolism Recurrent DVTS, chronic anticoagulation     PAIN:  Are you having pain? Yes: NPRS scale: 5/10 Pain location: low back Pain description: muscle spasm Aggravating factors: sitting upright, standing, walking Relieving factors: chripractor, massage  Are you having pain? Yes: NPRS scale: 5/10 Pain location: neck Pain description: tight band Aggravating factors: working at the computer Relieving factors: ice, medication   PRECAUTIONS: None  WEIGHT BEARING RESTRICTIONS: No  FALLS:  Has patient fallen in last 6 months? No  LIVING ENVIRONMENT: Lives with: lives with their family Lives in: House/apartment Stairs: Yes: Internal: 14 steps; on left going up Has following equipment at home: None  OCCUPATION: MD  PLOF: Independent  PATIENT GOALS: "I want to be back to where I was before I was in the [employer]."    OBJECTIVE:   DIAGNOSTIC FINDINGS:  Lumbar MRI: IMPRESSION: 1. No acute osseous abnormality in the lumbar spine. 2. Intermittent lower thoracic and lumbar chronic disc and endplate degeneration. Mild lumbar facet hypertrophy. No lumbar spinal stenosis or convincing lumbar neural impingement. Mild to moderate bilateral T10 and right T11 neural foraminal stenosis.  Cervical MRI: IMPRESSION: 1. No acute osseous abnormality. Multilevel cervical spine degeneration but capacious spinal canal. No spinal stenosis. 2. Generally mild cervical neural foraminal stenosis due to facet and/or disc osteophyte degeneration. Up to moderate left C8  nerve level foraminal stenosis.  PATIENT SURVEYS:  FOTO Neck 57% function to 61% predicted;  lumbar 46% function to 51% predicted   SCREENING FOR RED FLAGS: Bowel or bladder incontinence: No Spinal tumors: No Cauda equina syndrome: No Compression fracture: No  Abdominal aneurysm: No  COGNITION: Overall cognitive status: Within functional limits for tasks assessed     SENSATION: WFL  MUSCLE LENGTH: Hamstrings: moderate tightness bilaterally   POSTURE: rounded shoulders, forward head, and decreased lumbar lordosis  PALPATION: TTP cervical paraspinals, Rt upper trap, lumbar paraspinals   LUMBAR ROM:   AROM eval  Flexion WNL  Extension 50% limited   Right lateral flexion WNL  Left lateral flexion WNL  Right rotation WNL  Left rotation WNL   (Blank rows = not tested) pain with all lumbar AROM, most noted with extension   LOWER EXTREMITY ROM:     Active  Right eval Left eval  Hip flexion    Hip extension    Hip abduction    Hip adduction    Hip internal rotation    Hip external rotation    Knee flexion    Knee extension    Ankle dorsiflexion    Ankle plantarflexion    Ankle inversion    Ankle eversion     (Blank rows = not tested)  LOWER EXTREMITY MMT:    MMT Right eval Left eval  Hip flexion 4- 5  Hip extension 4 4  Hip abduction 4 4  Hip adduction    Hip internal rotation    Hip external rotation    Knee flexion 4+   Knee extension 5 5  Ankle dorsiflexion 4+ 5  Ankle plantarflexion 5 in sitting 5  Ankle inversion    Ankle eversion 4+ 5   (Blank rows = not tested) CERVICAL ROM:   Active ROM A/PROM (deg) eval  Flexion 40  Extension 50  Right lateral flexion 20  Left lateral flexion 30  Right rotation 62  Left rotation 50   (Blank rows = not tested) pain with all cervical AROM UPPER EXTREMITY MMT:  MMT Right eval Left eval  Shoulder flexion 4 5  Shoulder extension    Shoulder abduction 5 5  Shoulder adduction    Shoulder  extension    Shoulder internal rotation    Shoulder external rotation    Middle trapezius 4 4  Lower trapezius    Elbow flexion    Elbow extension    Wrist flexion    Wrist extension    Wrist ulnar deviation    Wrist radial deviation    Wrist pronation    Wrist supination    Grip strength     (Blank rows = not tested)  SPECIAL TESTS:  (+) Cervical compression/distraction  (+) SLR  FUNCTIONAL TESTS:  Squat: WBOS, poor hinge; increased pain    GAIT: Distance walked: 10 ft  Assistive device utilized: None Level of assistance: Complete Independence Comments: WNL  [facility] Adult PT Treatment:                                                DATE: 02/26/2023 Therapeutic Exercise: Prone on elbows 2 x 10 Prone press up 2 x 10  Updated HEP for prone on elbows/ press ups  Manual Therapy: IASTM along bil upper trap/ lumbar paraspinals  Applied KT tape to de-activate the thoracolumbar paraspinals.   Trigger Point Dry-Needling  Treatment instructions: Expect mild to moderate muscle soreness. S/S of pneumothorax if dry needled over a lung field, and to seek immediate medical attention should they occur. Patient verbalized understanding of these instructions and education.  Patient  Consent Given: Yes Education handout provided: Yes Muscles treated: bill upper trap, lumbar paraspinals Electrical stimulation performed: Yes Parameters:  CPS 20 x 10 min adjusting to tolerance for lumbar paraspinals Treatment response/outcome: reduction of symptoms/ stiffness. Self Care: Disc biomechanics and benefits of prone extension and the time frame to do this  Discussed posture and biomechanics when working to utilize a squatting Curator, or raising the patients bed to reduce stress on the low back and promote efficient posture/ positioning.    [facility] Adult PT Treatment:                                                DATE: 02/04/23  Therapeutic Exercise: Demonstrated and issued initial HEP.     Therapeutic Activity: Education on assessment findings that will be addressed throughout duration of POC.       PATIENT EDUCATION:  Education details: see treatment Person educated: Patient Education method: Explanation, Demonstration, Tactile cues, Verbal cues, and Handouts Education comprehension: verbalized understanding, returned demonstration, verbal cues required, tactile cues required, and needs further education  HOME EXERCISE PROGRAM: Access Code: [email or web address] URL: [email or web address] Date: 02/04/2023 Prepared by: Letitia Libra  Exercises - Supine Double Knee to Chest  - 2 x daily - 7 x weekly - 1 sets - 10 reps - 5 sec  hold - Hooklying Clamshell with Resistance  - 1 x daily - 7 x weekly - 2 sets - 10 reps - Supine Chin Tuck  - 2 x daily - 7 x weekly - 2 sets - 10 reps  ASSESSMENT:  CLINICAL IMPRESSION: Pt arrives to PT noting limited to no changes in the neck/ back. He reports his biggest issue is when he has worked multiple days in a row which he notices increase in his drop foot. Continued TPDN for the neck and low back, utilized E-stim with low back followed with IASTM techniques. Trialed KT taping to promote paraspinal inhibition. Discussed posture and lifting mechanics to reduce stressing the back. He responded well to extension biased exercise in prone, he didn't report LE N/T but did note reduction in back pain / pinching with a progression from prone on elbows to prone press up.    Evaluation: Patient is a 62 y.o. male well known to our clinic who was seen today for physical therapy evaluation and treatment for chronic neck and back pain that has been ongoing for years. Upon assessment he is noted to have slightly limited cervical AROM reporting pain with all ranges and limited and painful lumbar extension AROM, bilateral hip weakness, Rt knee and ankle weakness, postural abnormalities, and aberrant lifting mechanics. He will benefit from skilled PT  to address the above stated deficits in order to optimize his function and assist in overall pain reduction.   OBJECTIVE IMPAIRMENTS: decreased activity tolerance, decreased endurance, difficulty walking, decreased ROM, decreased strength, increased fascial restrictions, impaired UE functional use, improper body mechanics, postural dysfunction, and pain.   ACTIVITY LIMITATIONS: carrying, lifting, bending, sitting, standing, squatting, and locomotion level  PARTICIPATION LIMITATIONS: meal prep, cleaning, laundry, driving, shopping, community activity, and yard work  PERSONAL FACTORS: Age, Fitness, Profession, Time since onset of injury/illness/exacerbation, and 3+ comorbidities: see PMH above  are also affecting patient's functional outcome.   REHAB POTENTIAL: Fair chronicity of injury, co-morbidities, previous PT  CLINICAL DECISION MAKING: Evolving/moderate complexity  EVALUATION COMPLEXITY: Moderate   GOALS:  Goals reviewed with patient? Yes  SHORT TERM GOALS: Target date: 03/04/2023    Patient will be independent and compliant with initial HEP.   Baseline: see above Goal status: INITIAL  2.  Patient will demonstrate at least 60 degrees of left cervical rotation AROM to improve ability to complete head turns while driving.  Baseline: see above  Goal status: INITIAL  3.  Patient will demonstrate proper hip hinge to reduce stress on back with lifting activity.  Baseline: see above  Goal status: INITIAL  LONG TERM GOALS: Target date: 04/04/2023    Patient will score at least 61% neck and 51% lumbar on FOTO to signify clinically meaningful improvement in functional abilities.   Baseline: see above Goal status: INITIAL  2.  Patient will be independent with advanced home program to assist in management of his chronic conditions.  Baseline: see above Goal status: INITIAL  3.  Patient will demonstrate 5/5 hip strength to improve stability about the chain with prolonged  walking/standing activity.  Baseline: see above Goal status: INITIAL  4.  Patient will be able to lift at least 20 lbs with minimal pain.  Baseline: see above Goal status: INITIAL  5. Patient will demonstrate 5/5 bilateral middle trap strength to improve postural stability.   Baseline:see above  Goal status: INITIAL   PLAN:  PT FREQUENCY: 1-2x/week  PT DURATION: 8 weeks  PLANNED INTERVENTIONS: Therapeutic exercises, Therapeutic activity, Neuromuscular re-education, Balance training, Gait training, Patient/Family education, Self Care, Aquatic Therapy, Dry Needling, Electrical stimulation, Cryotherapy, Moist heat, Traction, Manual therapy, and Re-evaluation.  PLAN FOR NEXT SESSION: review and progress HEP prn; cervical and lumbar mobility, postural and core strengthening.   Calise Dunckel PT, DPT, LAT, ATC  02/26/23  12:46 PM

## 2023-02-27 ENCOUNTER — Encounter: Payer: Self-pay | Admitting: Physical Medicine & Rehabilitation

## 2023-02-27 ENCOUNTER — Encounter: Payer: 59 | Attending: Physical Medicine & Rehabilitation | Admitting: Physical Medicine & Rehabilitation

## 2023-02-27 ENCOUNTER — Other Ambulatory Visit (HOSPITAL_COMMUNITY): Payer: Self-pay

## 2023-02-27 VITALS — BP 125/80 | HR 90 | Ht 72.0 in | Wt 270.0 lb

## 2023-02-27 DIAGNOSIS — G43719 Chronic migraine without aura, intractable, without status migrainosus: Secondary | ICD-10-CM | POA: Insufficient documentation

## 2023-02-27 MED ORDER — ONABOTULINUMTOXINA 100 UNITS IJ SOLR
200.0000 [IU] | Freq: Once | INTRAMUSCULAR | Status: AC
  Administered 2023-02-27: 200 [IU] via INTRAMUSCULAR

## 2023-02-27 NOTE — Progress Notes (Signed)
Botox injection for chronic migraine prophylaxis.  Indication: History of migraines with greater than 15 headaches per month despite trials of oral medications.  Informed consent was obtained after discussing risks and benefits of the procedure with the patient this included bleeding bruising infection as well as facial drooping, eyelid lag, systemic effects of Botox. Patient elects to proceed and has given written consent.  Last Botox August 2023, HA freq increased   Patient placed in a seated position Dilution 50 units per cc  Muscles injected with dose  Procerus 5 units Corrugator 5 units bilateral Frontalis 5 units 2 injection sites on the right and 2 injection sites on the left Temporalis 5 units in 4 injection sites on the right and 4 injection sites on the left Occipitalis 5 units into 3 injections sites on the right and 3 injection sites on the left  Cervical paraspinals 5 units into 2 injection sites on the right and 2 injection sites on the left trapezius 10 units into 3 injection sites on the right and 7 units 3 injection sites on the left Patient tolerated procedure well. Post procedure instructions given.    Wasted 0U

## 2023-03-03 ENCOUNTER — Ambulatory Visit: Payer: 59 | Admitting: Physical Therapy

## 2023-03-05 ENCOUNTER — Ambulatory Visit: Payer: 59 | Admitting: Physical Therapy

## 2023-03-05 ENCOUNTER — Other Ambulatory Visit (HOSPITAL_COMMUNITY): Payer: Self-pay

## 2023-03-05 DIAGNOSIS — M6281 Muscle weakness (generalized): Secondary | ICD-10-CM

## 2023-03-05 DIAGNOSIS — M5441 Lumbago with sciatica, right side: Secondary | ICD-10-CM | POA: Diagnosis not present

## 2023-03-05 DIAGNOSIS — G8929 Other chronic pain: Secondary | ICD-10-CM | POA: Diagnosis not present

## 2023-03-05 DIAGNOSIS — M542 Cervicalgia: Secondary | ICD-10-CM

## 2023-03-05 NOTE — Therapy (Signed)
OUTPATIENT PHYSICAL THERAPY TREATMENT   Patient Name: Daniel CORNWELL, MD MRN: [id number] DOB:October 24, 1960,62 y.o., male 63 Date: 03/05/2023   END OF SESSION:  PT End of Session - 03/05/23 1411     Visit Number 3    Number of Visits 17    Date for PT Re-Evaluation 04/04/23    Authorization Type [employer]    PT Start Time 1410    Activity Tolerance Patient tolerated treatment well    Behavior During Therapy [facility] for tasks assessed/performed                Past Medical History:  Diagnosis Date   Anxiety    Aortic atherosclerosis (HCC)    BPH (benign prostatic hyperplasia)    Chronic pain syndrome    neck and low back   DDD (degenerative disc disease), cervical    Diverticulosis    DJD (degenerative joint disease)    Ganglion cyst    12 mm , right posterior knee   Hyperlipidemia    Internal hemorrhoids    Migraines    OA (osteoarthritis)    knees, hands, right shoulder   Positive QuantiFERON-TB Gold test    Shoulder pain    Tear of right rotator cuff    Type 2 diabetes mellitus (HCC)    last A1c 5.4 on 04-13-2018 in epic (followed by pcp)   Wears glasses    Past Surgical History:  Procedure Laterality Date   ANAL FISSURE REPAIR     ARTHOSCOPIC ROTAOR CUFF REPAIR Right 06/15/2018   Procedure: RIGHT SHOULDER ARTHROSCOPY, DEBRIDEMENT, SUBACROMIAL DECOMPRESSION, DISTAL CLAVICLE RESECTION, ROTATOR CUFF REPAIR;  Surgeon: Eugenia Mcalpine, MD;  Location: [facility];  Service: Orthopedics;  Laterality: Right;   COLONOSCOPY  05-26-2017   dr Dineen Kid SIGMOIDOSCOPY N/A 11/05/2021   Procedure: FLEXIBLE SIGMOIDOSCOPY;  Surgeon: Rachael Fee, MD;  Location: [facility];  Service: Endoscopy;  Laterality: N/A;   Hip Resurface  2006   KNEE ARTHROSCOPY W/ MENISCAL REPAIR Bilateral right 1993; left 2010   MASS EXCISION Right 04/21/2018   Procedure: EXCISION RIGHT THIGH SOFT TISSUE MASS;  Surgeon: Ollen Gross, MD;  Location: [facility];  Service:  Orthopedics;  Laterality: Right;   SEPTOPLASTY     SHOULDER ARTHROSCOPY WITH ROTATOR CUFF REPAIR Left    SLAP Tear Repair  2008   TONSILLECTOMY     TOTAL KNEE ARTHROPLASTY Left 12/08/2016   Procedure: LEFT TOTAL KNEE ARTHROPLASTY;  Surgeon: Ollen Gross, MD;  Location: [facility];  Service: Orthopedics;  Laterality: Left;   TOTAL KNEE ARTHROPLASTY WITH REVISION COMPONENTS Left 04/21/2018   Procedure: Left knee polyethylene exchange ;  Surgeon: Ollen Gross, MD;  Location: [facility];  Service: Orthopedics;  Laterality: Left;   Patient Active Problem List   Diagnosis Date Noted   Rectal bleed 11/10/2021   BRBPR (bright red blood per rectum) 11/09/2021   Controlled type 2 diabetes mellitus without complication, without long-term current use of insulin (HCC) 11/09/2021   Hyperlipidemia 11/09/2021   Chronic musculoskeletal pain 11/09/2021   Oropharyngeal candidiasis 11/09/2021   Hoarse voice quality 11/09/2021   Near syncope 11/09/2021   Acute lower GI bleeding 11/08/2021   Right shoulder injury 06/15/2018   S/P arthroscopy of right shoulder 06/15/2018   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   OA (osteoarthritis) of knee 12/08/2016   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 without status migrainosus 12/17/2015   Cervical facet joint syndrome 12/17/2015   Chronic bilateral low back pain without sciatica  12/17/2015   Primary osteoarthritis of left knee 12/17/2015    PCP:   Garlan Fillers, MD    REFERRING PROVIDER: Tressie Stalker, MD   REFERRING DIAG:  M51.36 (ICD-10-CM) - Other intervertebral disc degeneration, lumbar region  M54.2 (ICD-10-CM) - Cervicalgia    Rationale for Evaluation and Treatment: Rehabilitation  THERAPY DIAG:  Cervicalgia  Chronic low back pain with right-sided sciatica, unspecified back pain laterality  Muscle weakness (generalized)  ONSET DATE: chronic   SUBJECTIVE:                                                                                                                                                                                            SUBJECTIVE STATEMENT: "I am feeling a little more sore in the back from doing some more strengthening."    PERTINENT HISTORY:  Anxiety Chronic pain syndrome Degenerative disc disease Migraines Diabetes  Morbid obesity History of pulmonary embolism Recurrent DVTS, chronic anticoagulation     PAIN:  Are you having pain? Yes: NPRS scale: 7/10 Pain location: low back Pain description: muscle spasm Aggravating factors: sitting upright, standing, walking Relieving factors: chripractor, massage  Are you having pain? Yes: NPRS scale: 7/10 Pain location: neck Pain description: tight band Aggravating factors: working at the computer Relieving factors: ice, medication   PRECAUTIONS: None  WEIGHT BEARING RESTRICTIONS: No  FALLS:  Has patient fallen in last 6 months? No  LIVING ENVIRONMENT: Lives with: lives with their family Lives in: House/apartment Stairs: Yes: Internal: 14 steps; on left going up Has following equipment at home: None  OCCUPATION: MD  PLOF: Independent  PATIENT GOALS: "I want to be back to where I was before I was in the [employer]."    OBJECTIVE:   DIAGNOSTIC FINDINGS:  Lumbar MRI: IMPRESSION: 1. No acute osseous abnormality in the lumbar spine. 2. Intermittent lower thoracic and lumbar chronic disc and endplate degeneration. Mild lumbar facet hypertrophy. No lumbar spinal stenosis or convincing lumbar neural impingement. Mild to moderate bilateral T10 and right T11 neural foraminal stenosis.  Cervical MRI: IMPRESSION: 1. No acute osseous abnormality. Multilevel cervical spine degeneration but capacious spinal canal. No spinal stenosis. 2. Generally mild cervical neural foraminal stenosis due to facet and/or disc osteophyte degeneration. Up to moderate left C8 nerve level foraminal stenosis.  PATIENT SURVEYS:  FOTO Neck  57% function to 61% predicted;  lumbar 46% function to 51% predicted   SCREENING FOR RED FLAGS: Bowel or bladder incontinence: No Spinal tumors: No Cauda equina syndrome: No Compression fracture: No Abdominal aneurysm: No  COGNITION: Overall cognitive status: Within functional limits for tasks assessed  SENSATION: WFL  MUSCLE LENGTH: Hamstrings: moderate tightness bilaterally   POSTURE: rounded shoulders, forward head, and decreased lumbar lordosis  PALPATION: TTP cervical paraspinals, Rt upper trap, lumbar paraspinals   LUMBAR ROM:   AROM eval  Flexion WNL  Extension 50% limited   Right lateral flexion WNL  Left lateral flexion WNL  Right rotation WNL  Left rotation WNL   (Blank rows = not tested) pain with all lumbar AROM, most noted with extension   LOWER EXTREMITY ROM:     Active  Right eval Left eval  Hip flexion    Hip extension    Hip abduction    Hip adduction    Hip internal rotation    Hip external rotation    Knee flexion    Knee extension    Ankle dorsiflexion    Ankle plantarflexion    Ankle inversion    Ankle eversion     (Blank rows = not tested)  LOWER EXTREMITY MMT:    MMT Right eval Left eval  Hip flexion 4- 5  Hip extension 4 4  Hip abduction 4 4  Hip adduction    Hip internal rotation    Hip external rotation    Knee flexion 4+   Knee extension 5 5  Ankle dorsiflexion 4+ 5  Ankle plantarflexion 5 in sitting 5  Ankle inversion    Ankle eversion 4+ 5   (Blank rows = not tested) CERVICAL ROM:   Active ROM A/PROM (deg) eval AROM 03/05/23  Flexion 40   Extension 50   Right lateral flexion 20   Left lateral flexion 30   Right rotation 62   Left rotation 50    (Blank rows = not tested) pain with all cervical AROM UPPER EXTREMITY MMT:  MMT Right eval Left eval  Shoulder flexion 4 5  Shoulder extension    Shoulder abduction 5 5  Shoulder adduction    Shoulder extension    Shoulder internal rotation    Shoulder  external rotation    Middle trapezius 4 4  Lower trapezius    Elbow flexion    Elbow extension    Wrist flexion    Wrist extension    Wrist ulnar deviation    Wrist radial deviation    Wrist pronation    Wrist supination    Grip strength     (Blank rows = not tested)  SPECIAL TESTS:  (+) Cervical compression/distraction  (+) SLR  FUNCTIONAL TESTS:  Squat: WBOS, poor hinge; increased pain    GAIT: Distance walked: 10 ft  Assistive device utilized: None Level of assistance: Complete Independence Comments: WNL  [facility] Adult PT Treatment:                                                DATE: 03/05/2023 Therapeutic Exercise: Prone elbows 1 x 20 Prone press up 1 x 20 Prone contralateral UE/LE 2 x 10 Quadruped I'S T's and y's 1 x 10 with contralateral side staying ina protracted position.   Updated HEP today to for prone alternating UE/LE, and prone I's, T's and Y's  Manual Therapy: IASTM along R lumbar paraspinals and R upper trap KT taping for the back for inhibition  Trigger Point Dry-Needling  Treatment instructions: Expect mild to moderate muscle soreness. S/S of pneumothorax if dry needled over a lung field, and to seek  immediate medical attention should they occur. Patient verbalized understanding of these instructions and education.  Patient Consent Given: Yes Education handout provided: Previously provided Muscles treated: R thoracolumbar paraspinals, R upper trap Electrical stimulation performed: Yes Parameters:  CPS L 20 x 8 min, adjusting intensity to tolerance Treatment response/outcome: twitch response and relief of tension.     [facility] Adult PT Treatment:                                                DATE: 02/26/2023 Therapeutic Exercise: Prone on elbows 2 x 10 Prone press up 2 x 10  Updated HEP for prone on elbows/ press ups  Manual Therapy: IASTM along bil upper trap/ lumbar paraspinals  Applied KT tape to de-activate the thoracolumbar paraspinals.   Taught how to apply KT tape at home.  Trigger Point Dry-Needling  Treatment instructions: Expect mild to moderate muscle soreness. S/S of pneumothorax if dry needled over a lung field, and to seek immediate medical attention should they occur. Patient verbalized understanding of these instructions and education.  Patient Consent Given: Yes Education handout provided: Yes Muscles treated: bill upper trap, lumbar paraspinals Electrical stimulation performed: Yes Parameters:  CPS 20 x 10 min adjusting to tolerance for lumbar paraspinals Treatment response/outcome: reduction of symptoms/ stiffness. Self Care: Disc biomechanics and benefits of prone extension and the time frame to do this  Discussed posture and biomechanics when working to utilize a squatting Curator, or raising the patients bed to reduce stress on the low back and promote efficient posture/ positioning.    [facility] Adult PT Treatment:                                                DATE: 02/04/23 Therapeutic Exercise: Demonstrated and issued initial HEP.    Therapeutic Activity: Education on assessment findings that will be addressed throughout duration of POC.       PATIENT EDUCATION:  Education details: see treatment Person educated: Patient Education method: Explanation, Demonstration, Tactile cues, Verbal cues, and Handouts Education comprehension: verbalized understanding, returned demonstration, verbal cues required, tactile cues required, and needs further education  HOME EXERCISE PROGRAM: Access Code: [email or web address] URL: [email or web address] Date: 02/04/2023 Prepared by: Letitia Libra  Exercises - Supine Double Knee to Chest  - 2 x daily - 7 x weekly - 1 sets - 10 reps - 5 sec  hold - Hooklying Clamshell with Resistance  - 1 x daily - 7 x weekly - 2 sets - 10 reps - Supine Chin Tuck  - 2 x daily - 7 x weekly - 2 sets - 10 reps  ASSESSMENT:  CLINICAL IMPRESSION: Pt arrives to session today  reporting increased soreness in the back and the neck today at 7/10. Continued TPDN combined with E-stim for both R thoracolumbar paraspinals and R upper trap. Pt noted benefits from KT tape for inhibition previously so reapplied today for thoracolumbar paraspinals. Continued extension bias exercises which he does note reduction of pinching in the low back with repetition. Continued working on posterior shoulder strengthening which he did well with but did fatigue quickly using 2#. End of session he reported some soreness which is likely a result of DN and the exercises. Pt plans to take  the next 1-2 weeks off due to having an upcoming surgery for his L hand/ thumb.    Evaluation: Patient is a 62 y.o. male well known to our clinic who was seen today for physical therapy evaluation and treatment for chronic neck and back pain that has been ongoing for years. Upon assessment he is noted to have slightly limited cervical AROM reporting pain with all ranges and limited and painful lumbar extension AROM, bilateral hip weakness, Rt knee and ankle weakness, postural abnormalities, and aberrant lifting mechanics. He will benefit from skilled PT to address the above stated deficits in order to optimize his function and assist in overall pain reduction.   OBJECTIVE IMPAIRMENTS: decreased activity tolerance, decreased endurance, difficulty walking, decreased ROM, decreased strength, increased fascial restrictions, impaired UE functional use, improper body mechanics, postural dysfunction, and pain.   ACTIVITY LIMITATIONS: carrying, lifting, bending, sitting, standing, squatting, and locomotion level  PARTICIPATION LIMITATIONS: meal prep, cleaning, laundry, driving, shopping, community activity, and yard work  PERSONAL FACTORS: Age, Fitness, Profession, Time since onset of injury/illness/exacerbation, and 3+ comorbidities: see PMH above  are also affecting patient's functional outcome.   REHAB POTENTIAL: Fair  chronicity of injury, co-morbidities, previous PT  CLINICAL DECISION MAKING: Evolving/moderate complexity  EVALUATION COMPLEXITY: Moderate   GOALS: Goals reviewed with patient? Yes  SHORT TERM GOALS: Target date: 03/04/2023   Patient will be independent and compliant with initial HEP.   Baseline: see above Goal status: INITIAL  2.  Patient will demonstrate at least 60 degrees of left cervical rotation AROM to improve ability to complete head turns while driving.  Baseline: see above  Goal status: INITIAL  3.  Patient will demonstrate proper hip hinge to reduce stress on back with lifting activity.  Baseline: see above  Goal status: INITIAL  LONG TERM GOALS: Target date: 04/04/2023    Patient will score at least 61% neck and 51% lumbar on FOTO to signify clinically meaningful improvement in functional abilities.   Baseline: see above Goal status: INITIAL  2.  Patient will be independent with advanced home program to assist in management of his chronic conditions.  Baseline: see above Goal status: INITIAL  3.  Patient will demonstrate 5/5 hip strength to improve stability about the chain with prolonged walking/standing activity.  Baseline: see above Goal status: INITIAL  4.  Patient will be able to lift at least 20 lbs with minimal pain.  Baseline: see above Goal status: INITIAL  5. Patient will demonstrate 5/5 bilateral middle trap strength to improve postural stability.   Baseline:see above  Goal status: INITIAL   PLAN:  PT FREQUENCY: 1-2x/week  PT DURATION: 8 weeks  PLANNED INTERVENTIONS: Therapeutic exercises, Therapeutic activity, Neuromuscular re-education, Balance training, Gait training, Patient/Family education, Self Care, Aquatic Therapy, Dry Needling, Electrical stimulation, Cryotherapy, Moist heat, Traction, Manual therapy, and Re-evaluation.  PLAN FOR NEXT SESSION: review and progress HEP prn; cervical and lumbar mobility, postural and core  strengthening.   Reegan Mctighe PT, DPT, LAT, ATC  03/05/23  2:13 PM

## 2023-03-06 DIAGNOSIS — M47812 Spondylosis without myelopathy or radiculopathy, cervical region: Secondary | ICD-10-CM | POA: Diagnosis not present

## 2023-03-10 ENCOUNTER — Other Ambulatory Visit (HOSPITAL_COMMUNITY): Payer: Self-pay

## 2023-03-10 DIAGNOSIS — M19032 Primary osteoarthritis, left wrist: Secondary | ICD-10-CM | POA: Diagnosis not present

## 2023-03-10 DIAGNOSIS — M1812 Unilateral primary osteoarthritis of first carpometacarpal joint, left hand: Secondary | ICD-10-CM | POA: Diagnosis not present

## 2023-03-10 MED ORDER — ONDANSETRON 8 MG PO TBDP
8.0000 mg | ORAL_TABLET | Freq: Three times a day (TID) | ORAL | 0 refills | Status: AC
  Filled 2023-03-10: qty 15, 5d supply, fill #0

## 2023-03-10 MED ORDER — HYDROMORPHONE HCL 2 MG PO TABS
2.0000 mg | ORAL_TABLET | Freq: Three times a day (TID) | ORAL | 0 refills | Status: DC | PRN
  Filled 2023-03-10: qty 42, 7d supply, fill #0

## 2023-03-10 MED ORDER — METHOCARBAMOL 500 MG PO TABS
500.0000 mg | ORAL_TABLET | Freq: Four times a day (QID) | ORAL | 0 refills | Status: DC
  Filled 2023-03-10: qty 40, 10d supply, fill #0

## 2023-03-10 MED ORDER — OXYCODONE HCL 5 MG PO TABS
5.0000 mg | ORAL_TABLET | ORAL | 0 refills | Status: DC | PRN
  Filled 2023-03-10: qty 42, 7d supply, fill #0

## 2023-03-10 MED ORDER — CEPHALEXIN 500 MG PO CAPS
500.0000 mg | ORAL_CAPSULE | Freq: Four times a day (QID) | ORAL | 0 refills | Status: DC
  Filled 2023-03-10: qty 28, 7d supply, fill #0

## 2023-03-12 DIAGNOSIS — Z4789 Encounter for other orthopedic aftercare: Secondary | ICD-10-CM | POA: Diagnosis not present

## 2023-03-17 ENCOUNTER — Other Ambulatory Visit (HOSPITAL_COMMUNITY): Payer: Self-pay

## 2023-03-17 MED ORDER — MOUNJARO 12.5 MG/0.5ML ~~LOC~~ SOAJ
12.5000 mg | SUBCUTANEOUS | 1 refills | Status: DC
  Filled 2023-03-17 (×3): qty 2, 28d supply, fill #0

## 2023-03-24 DIAGNOSIS — M9902 Segmental and somatic dysfunction of thoracic region: Secondary | ICD-10-CM | POA: Diagnosis not present

## 2023-03-24 DIAGNOSIS — M9901 Segmental and somatic dysfunction of cervical region: Secondary | ICD-10-CM | POA: Diagnosis not present

## 2023-03-24 DIAGNOSIS — M9904 Segmental and somatic dysfunction of sacral region: Secondary | ICD-10-CM | POA: Diagnosis not present

## 2023-03-24 DIAGNOSIS — M9903 Segmental and somatic dysfunction of lumbar region: Secondary | ICD-10-CM | POA: Diagnosis not present

## 2023-03-25 DIAGNOSIS — Z4789 Encounter for other orthopedic aftercare: Secondary | ICD-10-CM | POA: Diagnosis not present

## 2023-03-27 ENCOUNTER — Other Ambulatory Visit (HOSPITAL_COMMUNITY): Payer: Self-pay

## 2023-03-31 ENCOUNTER — Other Ambulatory Visit (HOSPITAL_COMMUNITY): Payer: Self-pay

## 2023-04-07 NOTE — Therapy (Signed)
OUTPATIENT PHYSICAL THERAPY TREATMENT   Patient Name: Daniel WHEELING, MD MRN: [id number] DOB:25-Jul-1961,62 y.o., male 59 Date: 04/08/2023   END OF SESSION:  PT End of Session - 04/08/23 1627     Visit Number 4    Number of Visits 17    Date for PT Re-Evaluation 04/04/23    Authorization Type [employer]    PT Start Time 1430    PT Stop Time 1515    PT Time Calculation (min) 45 min    Activity Tolerance Patient tolerated treatment well    Behavior During Therapy WFL for tasks assessed/performed                Past Medical History:  Diagnosis Date   Anxiety    Aortic atherosclerosis (HCC)    BPH (benign prostatic hyperplasia)    Chronic pain syndrome    neck and low back   DDD (degenerative disc disease), cervical    Diverticulosis    DJD (degenerative joint disease)    Ganglion cyst    12 mm , right posterior knee   Hyperlipidemia    Internal hemorrhoids    Migraines    OA (osteoarthritis)    knees, hands, right shoulder   Positive QuantiFERON-TB Gold test    Shoulder pain    Tear of right rotator cuff    Type 2 diabetes mellitus (HCC)    last A1c 5.4 on 04-13-2018 in epic (followed by pcp)   Wears glasses    Past Surgical History:  Procedure Laterality Date   ANAL FISSURE REPAIR     ARTHOSCOPIC ROTAOR CUFF REPAIR Right 06/15/2018   Procedure: RIGHT SHOULDER ARTHROSCOPY, DEBRIDEMENT, SUBACROMIAL DECOMPRESSION, DISTAL CLAVICLE RESECTION, ROTATOR CUFF REPAIR;  Surgeon: Eugenia Mcalpine, MD;  Location: [facility];  Service: Orthopedics;  Laterality: Right;   COLONOSCOPY  05-26-2017   dr Dineen Kid SIGMOIDOSCOPY N/A 11/05/2021   Procedure: FLEXIBLE SIGMOIDOSCOPY;  Surgeon: Rachael Fee, MD;  Location: [facility];  Service: Endoscopy;  Laterality: N/A;   Hip Resurface  2006   KNEE ARTHROSCOPY W/ MENISCAL REPAIR Bilateral right 1993; left 2010   MASS EXCISION Right 04/21/2018   Procedure: EXCISION RIGHT THIGH SOFT TISSUE MASS;   Surgeon: Ollen Gross, MD;  Location: [facility];  Service: Orthopedics;  Laterality: Right;   SEPTOPLASTY     SHOULDER ARTHROSCOPY WITH ROTATOR CUFF REPAIR Left    SLAP Tear Repair  2008   TONSILLECTOMY     TOTAL KNEE ARTHROPLASTY Left 12/08/2016   Procedure: LEFT TOTAL KNEE ARTHROPLASTY;  Surgeon: Ollen Gross, MD;  Location: [facility];  Service: Orthopedics;  Laterality: Left;   TOTAL KNEE ARTHROPLASTY WITH REVISION COMPONENTS Left 04/21/2018   Procedure: Left knee polyethylene exchange ;  Surgeon: Ollen Gross, MD;  Location: [facility];  Service: Orthopedics;  Laterality: Left;   Patient Active Problem List   Diagnosis Date Noted   Rectal bleed 11/10/2021   BRBPR (bright red blood per rectum) 11/09/2021   Controlled type 2 diabetes mellitus without complication, without long-term current use of insulin (HCC) 11/09/2021   Hyperlipidemia 11/09/2021   Chronic musculoskeletal pain 11/09/2021   Oropharyngeal candidiasis 11/09/2021   Hoarse voice quality 11/09/2021   Near syncope 11/09/2021   Acute lower GI bleeding 11/08/2021   Right shoulder injury 06/15/2018   S/P arthroscopy of right shoulder 06/15/2018   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   OA (osteoarthritis) of knee 12/08/2016   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 without status migrainosus 12/17/2015  Cervical facet joint syndrome 12/17/2015   Chronic bilateral low back pain without sciatica 12/17/2015   Primary osteoarthritis of left knee 12/17/2015    PCP:   Garlan Fillers, MD    REFERRING PROVIDER: Tressie Stalker, MD   REFERRING DIAG:  M51.36 (ICD-10-CM) - Other intervertebral disc degeneration, lumbar region  M54.2 (ICD-10-CM) - Cervicalgia    Rationale for Evaluation and Treatment: Rehabilitation  THERAPY DIAG:  Cervicalgia  Chronic low back pain with right-sided sciatica, unspecified back pain laterality  Muscle weakness (generalized)  ONSET DATE: chronic   SUBJECTIVE:                                                                                                                                                                                            SUBJECTIVE STATEMENT: My upper traps and upper neck are bothering me. I'm having a HA to my R eye.   PERTINENT HISTORY:  Anxiety Chronic pain syndrome Degenerative disc disease Migraines Diabetes  Morbid obesity History of pulmonary embolism Recurrent DVTS, chronic anticoagulation     PAIN:  Are you having pain? Yes: NPRS scale: 5/10 Pain location: low back Pain description: muscle spasm Aggravating factors: sitting upright, standing, walking Relieving factors: chripractor, massage  Are you having pain? Yes: NPRS scale: 8-9/10 Pain location: neck Pain description: tight band Aggravating factors: working at the computer Relieving factors: ice, medication   PRECAUTIONS: None  WEIGHT BEARING RESTRICTIONS: No  FALLS:  Has patient fallen in last 6 months? No  LIVING ENVIRONMENT: Lives with: lives with their family Lives in: House/apartment Stairs: Yes: Internal: 14 steps; on left going up Has following equipment at home: None  OCCUPATION: MD  PLOF: Independent  PATIENT GOALS: "I want to be back to where I was before I was in the [employer]."    OBJECTIVE:   DIAGNOSTIC FINDINGS:  Lumbar MRI: IMPRESSION: 1. No acute osseous abnormality in the lumbar spine. 2. Intermittent lower thoracic and lumbar chronic disc and endplate degeneration. Mild lumbar facet hypertrophy. No lumbar spinal stenosis or convincing lumbar neural impingement. Mild to moderate bilateral T10 and right T11 neural foraminal stenosis.  Cervical MRI: IMPRESSION: 1. No acute osseous abnormality. Multilevel cervical spine degeneration but capacious spinal canal. No spinal stenosis. 2. Generally mild cervical neural foraminal stenosis due to facet and/or disc osteophyte degeneration. Up to moderate left C8  nerve level foraminal stenosis.  PATIENT SURVEYS:  FOTO Neck 57% function to 61% predicted;  lumbar 46% function to 51% predicted   SCREENING FOR RED FLAGS: Bowel or bladder incontinence: No Spinal tumors: No Cauda equina syndrome: No Compression fracture: No Abdominal aneurysm: No  COGNITION: Overall cognitive status: Within functional limits for tasks assessed     SENSATION: WFL  MUSCLE LENGTH: Hamstrings: moderate tightness bilaterally   POSTURE: rounded shoulders, forward head, and decreased lumbar lordosis  PALPATION: TTP cervical paraspinals, Rt upper trap, lumbar paraspinals   LUMBAR ROM:   AROM eval  Flexion WNL  Extension 50% limited   Right lateral flexion WNL  Left lateral flexion WNL  Right rotation WNL  Left rotation WNL   (Blank rows = not tested) pain with all lumbar AROM, most noted with extension   LOWER EXTREMITY ROM:     Active  Right eval Left eval  Hip flexion    Hip extension    Hip abduction    Hip adduction    Hip internal rotation    Hip external rotation    Knee flexion    Knee extension    Ankle dorsiflexion    Ankle plantarflexion    Ankle inversion    Ankle eversion     (Blank rows = not tested)  LOWER EXTREMITY MMT:    MMT Right eval Left eval  Hip flexion 4- 5  Hip extension 4 4  Hip abduction 4 4  Hip adduction    Hip internal rotation    Hip external rotation    Knee flexion 4+   Knee extension 5 5  Ankle dorsiflexion 4+ 5  Ankle plantarflexion 5 in sitting 5  Ankle inversion    Ankle eversion 4+ 5   (Blank rows = not tested) CERVICAL ROM:   Active ROM A/PROM (deg) eval AROM 03/05/23  Flexion 40   Extension 50   Right lateral flexion 20   Left lateral flexion 30   Right rotation 62   Left rotation 50    (Blank rows = not tested) pain with all cervical AROM UPPER EXTREMITY MMT:  MMT Right eval Left eval  Shoulder flexion 4 5  Shoulder extension    Shoulder abduction 5 5  Shoulder adduction     Shoulder extension    Shoulder internal rotation    Shoulder external rotation    Middle trapezius 4 4  Lower trapezius    Elbow flexion    Elbow extension    Wrist flexion    Wrist extension    Wrist ulnar deviation    Wrist radial deviation    Wrist pronation    Wrist supination    Grip strength     (Blank rows = not tested)  SPECIAL TESTS:  (+) Cervical compression/distraction  (+) SLR  FUNCTIONAL TESTS:  Squat: WBOS, poor hinge; increased pain    GAIT: Distance walked: 10 ft  Assistive device utilized: None Level of assistance: Complete Independence Comments: WNL  [facility] Adult PT Treatment:                                                DATE: 04/08/2023 Therapeutic Exercise: Supine chin tucks x5 5" Supine neck lift offs x5 5" Prone arm Y's x10 Prone leg lifts x10 Prone alt arm/leg lifts x10 Prone contralateral UE/LE 2 x 10 Self care: Instruction in the use of suboccipital massage using 2 taped tennis balls. Pt practiced and returned demonstration Manual Therapy: STM to the upper traps and suboccipitals Suboccipital release  Skilled palpation to ID TrPs and taut muscle bands  Trigger Point Dry-Needling  Treatment instructions: Expect mild  to moderate muscle soreness. S/S of pneumothorax if dry needled over a lung field, and to seek immediate medical attention should they occur. Patient verbalized understanding of these instructions and education.  Patient Consent Given: Yes Education handout provided: Previously provided Muscles treated: R thoracolumbar paraspinals, R upper trap,  Electrical stimulation performed: NO Treatment response/outcome: twitch response and relief of tension.   [facility] Adult PT Treatment:                                                DATE: 03/05/2023 Therapeutic Exercise: Prone elbows 1 x 20 Prone press up 1 x 20 Prone contralateral UE/LE 2 x 10 Quadruped I'S T's and y's 1 x 10 with contralateral side staying ina protracted  position.   Updated HEP today to for prone alternating UE/LE, and prone I's, T's and Y's  Manual Therapy: IASTM along R lumbar paraspinals and R upper trap KT taping for the back for inhibition  Trigger Point Dry-Needling  Treatment instructions: Expect mild to moderate muscle soreness. S/S of pneumothorax if dry needled over a lung field, and to seek immediate medical attention should they occur. Patient verbalized understanding of these instructions and education.  Patient Consent Given: Yes Education handout provided: Previously provided Muscles treated: R thoracolumbar paraspinals, R upper trap Electrical stimulation performed: Yes Parameters:  CPS L 20 x 8 min, adjusting intensity to tolerance Treatment response/outcome: twitch response and relief of tension.     [facility] Adult PT Treatment:                                                DATE: 02/26/2023 Therapeutic Exercise: Prone on elbows 2 x 10 Prone press up 2 x 10  Updated HEP for prone on elbows/ press ups  Manual Therapy: IASTM along bil upper trap/ lumbar paraspinals  Applied KT tape to de-activate the thoracolumbar paraspinals.  Taught how to apply KT tape at home.  Trigger Point Dry-Needling  Treatment instructions: Expect mild to moderate muscle soreness. S/S of pneumothorax if dry needled over a lung field, and to seek immediate medical attention should they occur. Patient verbalized understanding of these instructions and education.  Patient Consent Given: Yes Education handout provided: Yes Muscles treated: bill upper trap, lumbar paraspinals Electrical stimulation performed: Yes Parameters:  CPS 20 x 10 min adjusting to tolerance for lumbar paraspinals Treatment response/outcome: reduction of symptoms/ stiffness. Self Care: Disc biomechanics and benefits of prone extension and the time frame to do this  Discussed posture and biomechanics when working to utilize a squatting Curator, or raising the patients  bed to reduce stress on the low back and promote efficient posture/ positioning.  PATIENT EDUCATION:  Education details: see treatment Person educated: Patient Education method: Explanation, Demonstration, Tactile cues, Verbal cues, and Handouts Education comprehension: verbalized understanding, returned demonstration, verbal cues required, tactile cues required, and needs further education  HOME EXERCISE PROGRAM: Access Code: [email or web address] URL: [email or web address] Date: 02/04/2023 Prepared by: Letitia Libra  Exercises - Supine Double Knee to Chest  - 2 x daily - 7 x weekly - 1 sets - 10 reps - 5 sec  hold - Hooklying Clamshell with Resistance  - 1 x daily - 7 x weekly - 2 sets -  10 reps - Supine Chin Tuck  - 2 x daily - 7 x weekly - 2 sets - 10 reps  ASSESSMENT:  CLINICAL IMPRESSION: Pt arrives to session today reporting increased upper trap and suboccipital pain c HA to the R eye. Pt's PT visits have been limited with pt undergoing L hand surgery at the end of May. Pt is wearing a L hand immobilizer. Manual therapy was completed as noted above for upper shoulder, neck, and suboccipital pain f/b TPDN to the R upper trap and suboccipitals and also to the thoracolumbar paraspinals. Pt then completed postural and posterior chain strengthening for the neck and trunk for muscle activation s/p TPDN. Following the session, pt reported increased mobility and flexibility of his neck. Pt tolerated PT today without adverse effects. Pt will continue to benefit from skilled PT to address impairments for improved function.   Evaluation: Patient is a 62 y.o. male well known to our clinic who was seen today for physical therapy evaluation and treatment for chronic neck and back pain that has been ongoing for years. Upon assessment he is noted to have slightly limited cervical AROM reporting pain with all ranges and limited and painful lumbar extension AROM, bilateral hip weakness, Rt knee and  ankle weakness, postural abnormalities, and aberrant lifting mechanics. He will benefit from skilled PT to address the above stated deficits in order to optimize his function and assist in overall pain reduction.   OBJECTIVE IMPAIRMENTS: decreased activity tolerance, decreased endurance, difficulty walking, decreased ROM, decreased strength, increased fascial restrictions, impaired UE functional use, improper body mechanics, postural dysfunction, and pain.   ACTIVITY LIMITATIONS: carrying, lifting, bending, sitting, standing, squatting, and locomotion level  PARTICIPATION LIMITATIONS: meal prep, cleaning, laundry, driving, shopping, community activity, and yard work  PERSONAL FACTORS: Age, Fitness, Profession, Time since onset of injury/illness/exacerbation, and 3+ comorbidities: see PMH above  are also affecting patient's functional outcome.   REHAB POTENTIAL: Fair chronicity of injury, co-morbidities, previous PT  CLINICAL DECISION MAKING: Evolving/moderate complexity  EVALUATION COMPLEXITY: Moderate   GOALS: Goals reviewed with patient? Yes  SHORT TERM GOALS: Target date: 03/04/2023   Patient will be independent and compliant with initial HEP.   Baseline: see above Goal status: INITIAL  2.  Patient will demonstrate at least 60 degrees of left cervical rotation AROM to improve ability to complete head turns while driving.  Baseline: see above  Goal status: INITIAL  3.  Patient will demonstrate proper hip hinge to reduce stress on back with lifting activity.  Baseline: see above  Goal status: INITIAL  LONG TERM GOALS: Target date: 04/04/2023    Patient will score at least 61% neck and 51% lumbar on FOTO to signify clinically meaningful improvement in functional abilities.   Baseline: see above Goal status: INITIAL  2.  Patient will be independent with advanced home program to assist in management of his chronic conditions.  Baseline: see above Goal status: INITIAL  3.   Patient will demonstrate 5/5 hip strength to improve stability about the chain with prolonged walking/standing activity.  Baseline: see above Goal status: INITIAL  4.  Patient will be able to lift at least 20 lbs with minimal pain.  Baseline: see above Goal status: INITIAL  5. Patient will demonstrate 5/5 bilateral middle trap strength to improve postural stability.   Baseline:see above  Goal status: INITIAL   PLAN:  PT FREQUENCY: 1-2x/week  PT DURATION: 8 weeks  PLANNED INTERVENTIONS: Therapeutic exercises, Therapeutic activity, Neuromuscular re-education, Balance training, Gait training, Patient/Family  education, Self Care, Aquatic Therapy, Dry Needling, Electrical stimulation, Cryotherapy, Moist heat, Traction, Manual therapy, and Re-evaluation.  PLAN FOR NEXT SESSION: review and progress HEP prn; cervical and lumbar mobility, postural and core strengthening.   Ruba Outen MS, PT 04/08/23 5:54 PM

## 2023-04-08 ENCOUNTER — Ambulatory Visit: Payer: 59 | Attending: Neurosurgery

## 2023-04-08 DIAGNOSIS — M25642 Stiffness of left hand, not elsewhere classified: Secondary | ICD-10-CM | POA: Diagnosis not present

## 2023-04-08 DIAGNOSIS — Z4789 Encounter for other orthopedic aftercare: Secondary | ICD-10-CM | POA: Diagnosis not present

## 2023-04-08 DIAGNOSIS — M5441 Lumbago with sciatica, right side: Secondary | ICD-10-CM | POA: Insufficient documentation

## 2023-04-08 DIAGNOSIS — M6281 Muscle weakness (generalized): Secondary | ICD-10-CM | POA: Insufficient documentation

## 2023-04-08 DIAGNOSIS — M542 Cervicalgia: Secondary | ICD-10-CM | POA: Insufficient documentation

## 2023-04-08 DIAGNOSIS — G8929 Other chronic pain: Secondary | ICD-10-CM | POA: Insufficient documentation

## 2023-04-08 DIAGNOSIS — M1812 Unilateral primary osteoarthritis of first carpometacarpal joint, left hand: Secondary | ICD-10-CM | POA: Diagnosis not present

## 2023-04-13 NOTE — Therapy (Signed)
OUTPATIENT PHYSICAL THERAPY TREATMENT/Re-Cert   Patient Name: DMOREA SPARGER, MD MRN: [id number] DOB:[birth year],62 y.o., male 55 Date: 04/14/2023   END OF SESSION:  PT End of Session - 04/14/23 1330     Visit Number 5    Number of Visits 17    Date for PT Re-Evaluation 05/12/23    Authorization Type [employer]    PT Start Time 1330    PT Stop Time 1415    PT Time Calculation (min) 45 min    Activity Tolerance Patient tolerated treatment well    Behavior During Therapy WFL for tasks assessed/performed                Past Medical History:  Diagnosis Date   Anxiety    Aortic atherosclerosis (HCC)    BPH (benign prostatic hyperplasia)    Chronic pain syndrome    neck and low back   DDD (degenerative disc disease), cervical    Diverticulosis    DJD (degenerative joint disease)    Ganglion cyst    12 mm , right posterior knee   Hyperlipidemia    Internal hemorrhoids    Migraines    OA (osteoarthritis)    knees, hands, right shoulder   Positive QuantiFERON-TB Gold test    Shoulder pain    Tear of right rotator cuff    Type 2 diabetes mellitus (HCC)    last A1c 5.4 on 04-13-2018 in epic (followed by pcp)   Wears glasses    Past Surgical History:  Procedure Laterality Date   ANAL FISSURE REPAIR     ARTHOSCOPIC ROTAOR CUFF REPAIR Right 06/15/2018   Procedure: RIGHT SHOULDER ARTHROSCOPY, DEBRIDEMENT, SUBACROMIAL DECOMPRESSION, DISTAL CLAVICLE RESECTION, ROTATOR CUFF REPAIR;  Surgeon: Eugenia Mcalpine, MD;  Location: [facility];  Service: Orthopedics;  Laterality: Right;   COLONOSCOPY  05-26-2017   dr Dineen Kid SIGMOIDOSCOPY N/A 11/05/2021   Procedure: FLEXIBLE SIGMOIDOSCOPY;  Surgeon: Rachael Fee, MD;  Location: [facility];  Service: Endoscopy;  Laterality: N/A;   Hip Resurface  2006   KNEE ARTHROSCOPY W/ MENISCAL REPAIR Bilateral right 1993; left 2010   MASS EXCISION Right 04/21/2018   Procedure: EXCISION RIGHT THIGH SOFT TISSUE MASS;   Surgeon: Ollen Gross, MD;  Location: [facility];  Service: Orthopedics;  Laterality: Right;   SEPTOPLASTY     SHOULDER ARTHROSCOPY WITH ROTATOR CUFF REPAIR Left    SLAP Tear Repair  2008   TONSILLECTOMY     TOTAL KNEE ARTHROPLASTY Left 12/08/2016   Procedure: LEFT TOTAL KNEE ARTHROPLASTY;  Surgeon: Ollen Gross, MD;  Location: [facility];  Service: Orthopedics;  Laterality: Left;   TOTAL KNEE ARTHROPLASTY WITH REVISION COMPONENTS Left 04/21/2018   Procedure: Left knee polyethylene exchange ;  Surgeon: Ollen Gross, MD;  Location: [facility];  Service: Orthopedics;  Laterality: Left;   Patient Active Problem List   Diagnosis Date Noted   Rectal bleed 11/10/2021   BRBPR (bright red blood per rectum) 11/09/2021   Controlled type 2 diabetes mellitus without complication, without long-term current use of insulin (HCC) 11/09/2021   Hyperlipidemia 11/09/2021   Chronic musculoskeletal pain 11/09/2021   Oropharyngeal candidiasis 11/09/2021   Hoarse voice quality 11/09/2021   Near syncope 11/09/2021   Acute lower GI bleeding 11/08/2021   Right shoulder injury 06/15/2018   S/P arthroscopy of right shoulder 06/15/2018   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   OA (osteoarthritis) of knee 12/08/2016   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 without status migrainosus 12/17/2015  Cervical facet joint syndrome 12/17/2015   Chronic bilateral low back pain without sciatica 12/17/2015   Primary osteoarthritis of left knee 12/17/2015    PCP:   Garlan Fillers, MD    REFERRING PROVIDER: Tressie Stalker, MD   REFERRING DIAG:  M51.36 (ICD-10-CM) - Other intervertebral disc degeneration, lumbar region  M54.2 (ICD-10-CM) - Cervicalgia    Rationale for Evaluation and Treatment: Rehabilitation  THERAPY DIAG:  Cervicalgia - Plan: PT plan of care cert/re-cert  Chronic low back pain with right-sided sciatica, unspecified back pain laterality - Plan: PT plan of care  cert/re-cert  Muscle weakness (generalized) - Plan: PT plan of care cert/re-cert  ONSET DATE: chronic   SUBJECTIVE:                                                                                                                                                                                           SUBJECTIVE STATEMENT: Pt reports good improvement in all areas of pain after the last session esp for the R suboccipital and HAs.   PERTINENT HISTORY:  Anxiety Chronic pain syndrome Degenerative disc disease Migraines Diabetes  Morbid obesity History of pulmonary embolism Recurrent DVTS, chronic anticoagulation     PAIN:  Are you having pain? Yes: NPRS scale: 4/10 Pain location: low back Pain description: muscle spasm Aggravating factors: sitting upright, standing, walking Relieving factors: chripractor, massage  Are you having pain? Yes: NPRS scale: 6/10 Pain location: neck Pain description: tight band Aggravating factors: working at the computer Relieving factors: ice, medication   PRECAUTIONS: None  WEIGHT BEARING RESTRICTIONS: No  FALLS:  Has patient fallen in last 6 months? No  LIVING ENVIRONMENT: Lives with: lives with their family Lives in: House/apartment Stairs: Yes: Internal: 14 steps; on left going up Has following equipment at home: None  OCCUPATION: MD  PLOF: Independent  PATIENT GOALS: "I want to be back to where I was before I was in the [employer]."    OBJECTIVE:   DIAGNOSTIC FINDINGS:  Lumbar MRI: IMPRESSION: 1. No acute osseous abnormality in the lumbar spine. 2. Intermittent lower thoracic and lumbar chronic disc and endplate degeneration. Mild lumbar facet hypertrophy. No lumbar spinal stenosis or convincing lumbar neural impingement. Mild to moderate bilateral T10 and right T11 neural foraminal stenosis.  Cervical MRI: IMPRESSION: 1. No acute osseous abnormality. Multilevel cervical spine degeneration but capacious spinal  canal. No spinal stenosis. 2. Generally mild cervical neural foraminal stenosis due to facet and/or disc osteophyte degeneration. Up to moderate left C8 nerve level foraminal stenosis.  PATIENT SURVEYS:  FOTO Neck 57% function to 61% predicted;  lumbar 46% function to 51% predicted  SCREENING FOR RED FLAGS: Bowel or bladder incontinence: No Spinal tumors: No Cauda equina syndrome: No Compression fracture: No Abdominal aneurysm: No  COGNITION: Overall cognitive status: Within functional limits for tasks assessed     SENSATION: WFL  MUSCLE LENGTH: Hamstrings: moderate tightness bilaterally   POSTURE: rounded shoulders, forward head, and decreased lumbar lordosis  PALPATION: TTP cervical paraspinals, Rt upper trap, lumbar paraspinals   LUMBAR ROM:   AROM eval  Flexion WNL  Extension 50% limited   Right lateral flexion WNL  Left lateral flexion WNL  Right rotation WNL  Left rotation WNL   (Blank rows = not tested) pain with all lumbar AROM, most noted with extension   LOWER EXTREMITY ROM:     Active  Right eval Left eval  Hip flexion    Hip extension    Hip abduction    Hip adduction    Hip internal rotation    Hip external rotation    Knee flexion    Knee extension    Ankle dorsiflexion    Ankle plantarflexion    Ankle inversion    Ankle eversion     (Blank rows = not tested)  LOWER EXTREMITY MMT:    MMT Right eval Left eval  Hip flexion 4- 5  Hip extension 4 4  Hip abduction 4 4  Hip adduction    Hip internal rotation    Hip external rotation    Knee flexion 4+   Knee extension 5 5  Ankle dorsiflexion 4+ 5  Ankle plantarflexion 5 in sitting 5  Ankle inversion    Ankle eversion 4+ 5   (Blank rows = not tested) CERVICAL ROM:   Active ROM A/PROM (deg) eval AROM 03/05/23 AROM  Flexion 40    Extension 50    Right lateral flexion 20    Left lateral flexion 30    Right rotation 62    Left rotation 50  55   (Blank rows = not tested) pain  with all cervical AROM UPPER EXTREMITY MMT:  MMT Right eval Left eval  Shoulder flexion 4 5  Shoulder extension    Shoulder abduction 5 5  Shoulder adduction    Shoulder extension    Shoulder internal rotation    Shoulder external rotation    Middle trapezius 4 4  Lower trapezius    Elbow flexion    Elbow extension    Wrist flexion    Wrist extension    Wrist ulnar deviation    Wrist radial deviation    Wrist pronation    Wrist supination    Grip strength     (Blank rows = not tested)  SPECIAL TESTS:  (+) Cervical compression/distraction  (+) SLR  FUNCTIONAL TESTS:  Squat: WBOS, poor hinge; increased pain    GAIT: Distance walked: 10 ft  Assistive device utilized: None Level of assistance: Complete Independence Comments: WNL  [facility] Adult PT Treatment:                                                DATE: 04/14/2023 Therapeutic Exercise: Supine chin tucks x5 5" Supine neck lift offs x5 5" Upper trap stretch x2 15" Prone arm Y's x10 Prone leg lifts x10 Prone alt arm/leg lifts x10 Manual Therapy: STM to the upper traps Skilled palpation to ID TrPs and taut muscle bands  Trigger Point Dry-Needling  Treatment instructions: Expect mild to moderate muscle soreness. S/S of pneumothorax if dry needled over a lung field, and to seek immediate medical attention should they occur. Patient verbalized understanding of these instructions and education.  Patient Consent Given: Yes Education handout provided: Previously provided Muscles treated: R thoracolumbar paraspinals, R upper trap,  Electrical stimulation performed: NO Treatment response/outcome: twitch response and relief of tension.   [facility] Adult PT Treatment:                                                DATE: 04/08/2023 Therapeutic Exercise: Supine chin tucks x5 5" Supine neck lift offs x5 5" Prone arm Y's x10 Prone leg lifts x10 Prone alt arm/leg lifts x10 Prone contralateral UE/LE 2 x 10 Self  care: Instruction in the use of suboccipital massage using 2 taped tennis balls. Pt practiced and returned demonstration Manual Therapy: STM to the upper traps and suboccipitals Suboccipital release  Skilled palpation to ID TrPs and taut muscle bands  Trigger Point Dry-Needling  Treatment instructions: Expect mild to moderate muscle soreness. S/S of pneumothorax if dry needled over a lung field, and to seek immediate medical attention should they occur. Patient verbalized understanding of these instructions and education.  Patient Consent Given: Yes Education handout provided: Previously provided Muscles treated: R thoracolumbar paraspinals, R upper trap,  Electrical stimulation performed: NO Treatment response/outcome: twitch response and relief of tension.   [facility] Adult PT Treatment:                                                DATE: 03/05/2023 Therapeutic Exercise: Prone elbows 1 x 20 Prone press up 1 x 20 Prone contralateral UE/LE 2 x 10 Quadruped I'S T's and y's 1 x 10 with contralateral side staying ina protracted position.   Updated HEP today to for prone alternating UE/LE, and prone I's, T's and Y's  Manual Therapy: IASTM along R lumbar paraspinals and R upper trap KT taping for the back for inhibition  Trigger Point Dry-Needling  Treatment instructions: Expect mild to moderate muscle soreness. S/S of pneumothorax if dry needled over a lung field, and to seek immediate medical attention should they occur. Patient verbalized understanding of these instructions and education.  Patient Consent Given: Yes Education handout provided: Previously provided Muscles treated: R thoracolumbar paraspinals, R upper trap Electrical stimulation performed: Yes Parameters:  CPS L 20 x 8 min, adjusting intensity to tolerance Treatment response/outcome: twitch response and relief of tension.    PATIENT EDUCATION:  Education details: see treatment Person educated: Patient Education  method: Explanation, Demonstration, Tactile cues, Verbal cues, and Handouts Education comprehension: verbalized understanding, returned demonstration, verbal cues required, tactile cues required, and needs further education  HOME EXERCISE PROGRAM: Access Code: [email or web address] URL: [email or web address] Date: 02/04/2023 Prepared by: Letitia Libra  Exercises - Supine Double Knee to Chest  - 2 x daily - 7 x weekly - 1 sets - 10 reps - 5 sec  hold - Hooklying Clamshell with Resistance  - 1 x daily - 7 x weekly - 2 sets - 10 reps - Supine Chin Tuck  - 2 x daily - 7 x weekly - 2 sets - 10 reps  ASSESSMENT:  CLINICAL IMPRESSION: Pt's time  in PT has been limited by L hand surgery in May. After pt's 1st appt back in PT last week, he reports improvement with his HAs, and cervical and low back pain. L cervical rotation was reassessed and found minimally improved. Manual therapy was completed as noted above for upper traps f/b TPDN to the R upper trap and to the thoracolumbar paraspinals. Therex for postural and posterior chain strengthening for the neck and trunk were then completed for muscle activation. Pt tolerated PT today without adverse effects. Pt will continue to benefit from skilled PT 2w4 to address impairments for improved function with less pain.  Evaluation: Patient is a 62 y.o. male well known to our clinic who was seen today for physical therapy evaluation and treatment for chronic neck and back pain that has been ongoing for years. Upon assessment he is noted to have slightly limited cervical AROM reporting pain with all ranges and limited and painful lumbar extension AROM, bilateral hip weakness, Rt knee and ankle weakness, postural abnormalities, and aberrant lifting mechanics. He will benefit from skilled PT to address the above stated deficits in order to optimize his function and assist in overall pain reduction.   OBJECTIVE IMPAIRMENTS: decreased activity tolerance, decreased  endurance, difficulty walking, decreased ROM, decreased strength, increased fascial restrictions, impaired UE functional use, improper body mechanics, postural dysfunction, and pain.   ACTIVITY LIMITATIONS: carrying, lifting, bending, sitting, standing, squatting, and locomotion level  PARTICIPATION LIMITATIONS: meal prep, cleaning, laundry, driving, shopping, community activity, and yard work  PERSONAL FACTORS: Age, Fitness, Profession, Time since onset of injury/illness/exacerbation, and 3+ comorbidities: see PMH above  are also affecting patient's functional outcome.   REHAB POTENTIAL: Fair chronicity of injury, co-morbidities, previous PT  CLINICAL DECISION MAKING: Evolving/moderate complexity  EVALUATION COMPLEXITY: Moderate   GOALS: Goals reviewed with patient? Yes  SHORT TERM GOALS: Target date: 03/04/2023   Patient will be independent and compliant with initial HEP.  Baseline: see above Goal status: Ind  2.  Patient will demonstrate at least 60 degrees of left cervical rotation AROM to improve ability to complete head turns while driving.  Baseline: see above  Status: L rotation 55d Goal status: Improved, but not met  3.  Patient will demonstrate proper hip hinge to reduce stress on back with lifting activity.  Baseline: see above  Goal status: No met  LONG TERM GOALS: Target date: 05/12/2023  Patient will score at least 61% neck and 51% lumbar on FOTO to signify clinically meaningful improvement in functional abilities.   Baseline: see above Goal status: Ongoing  2.  Patient will be independent with advanced home program to assist in management of his chronic conditions.  Baseline: see above Goal status: Ongoing  3.  Patient will demonstrate 5/5 hip strength to improve stability about the chain with prolonged walking/standing activity.  Baseline: see above Goal status: Ongoing  4.  Patient will be able to lift at least 20 lbs with minimal pain.  Baseline: see  above Goal status: Ogoing  5. Patient will demonstrate 5/5 bilateral middle trap strength to improve postural stability.   Baseline:see above  Goal status: Ongoing  PLAN:  PT FREQUENCY: 2x/week  PT DURATION: 4 weeks  PLANNED INTERVENTIONS: Therapeutic exercises, Therapeutic activity, Neuromuscular re-education, Balance training, Gait training, Patient/Family education, Self Care, Aquatic Therapy, Dry Needling, Electrical stimulation, Cryotherapy, Moist heat, Traction, Manual therapy, and Re-evaluation.  PLAN FOR NEXT SESSION: review and progress HEP prn; cervical and lumbar mobility, postural and core strengthening.   Callaway Hailes  MS, PT 04/14/23 6:26 PM

## 2023-04-14 ENCOUNTER — Ambulatory Visit: Payer: 59 | Attending: Neurosurgery

## 2023-04-14 DIAGNOSIS — M25511 Pain in right shoulder: Secondary | ICD-10-CM | POA: Insufficient documentation

## 2023-04-14 DIAGNOSIS — M5441 Lumbago with sciatica, right side: Secondary | ICD-10-CM | POA: Insufficient documentation

## 2023-04-14 DIAGNOSIS — G8929 Other chronic pain: Secondary | ICD-10-CM | POA: Diagnosis not present

## 2023-04-14 DIAGNOSIS — M542 Cervicalgia: Secondary | ICD-10-CM | POA: Insufficient documentation

## 2023-04-14 DIAGNOSIS — M6281 Muscle weakness (generalized): Secondary | ICD-10-CM | POA: Insufficient documentation

## 2023-04-14 DIAGNOSIS — R293 Abnormal posture: Secondary | ICD-10-CM | POA: Diagnosis not present

## 2023-04-20 NOTE — Therapy (Signed)
OUTPATIENT PHYSICAL THERAPY TREATMENT/Re-Cert   Patient Name: Daniel MULLER, MD MRN: [id number] DOB:[birth year],62 y.o., male 27 Date: 04/14/2023   END OF SESSION:  PT End of Session - 04/14/23 1330     Visit Number 5    Number of Visits 17    Date for PT Re-Evaluation 05/12/23    Authorization Type [employer]    PT Start Time 1330    PT Stop Time 1415    PT Time Calculation (min) 45 min    Activity Tolerance Patient tolerated treatment well    Behavior During Therapy WFL for tasks assessed/performed                Past Medical History:  Diagnosis Date   Anxiety    Aortic atherosclerosis (HCC)    BPH (benign prostatic hyperplasia)    Chronic pain syndrome    neck and low back   DDD (degenerative disc disease), cervical    Diverticulosis    DJD (degenerative joint disease)    Ganglion cyst    12 mm , right posterior knee   Hyperlipidemia    Internal hemorrhoids    Migraines    OA (osteoarthritis)    knees, hands, right shoulder   Positive QuantiFERON-TB Gold test    Shoulder pain    Tear of right rotator cuff    Type 2 diabetes mellitus (HCC)    last A1c 5.4 on 04-13-2018 in epic (followed by pcp)   Wears glasses    Past Surgical History:  Procedure Laterality Date   ANAL FISSURE REPAIR     ARTHOSCOPIC ROTAOR CUFF REPAIR Right 06/15/2018   Procedure: RIGHT SHOULDER ARTHROSCOPY, DEBRIDEMENT, SUBACROMIAL DECOMPRESSION, DISTAL CLAVICLE RESECTION, ROTATOR CUFF REPAIR;  Surgeon: Eugenia Mcalpine, MD;  Location: [facility] [place];  Service: Orthopedics;  Laterality: Right;   COLONOSCOPY  05-26-2017   dr Dineen Kid SIGMOIDOSCOPY N/A 11/05/2021   Procedure: FLEXIBLE SIGMOIDOSCOPY;  Surgeon: Rachael Fee, MD;  Location: [facility];  Service: Endoscopy;  Laterality: N/A;   Hip Resurface  2006   KNEE ARTHROSCOPY W/ MENISCAL REPAIR Bilateral right 1993; left 2010   MASS EXCISION Right 04/21/2018   Procedure: EXCISION RIGHT THIGH SOFT TISSUE MASS;   Surgeon: Ollen Gross, MD;  Location: [facility];  Service: Orthopedics;  Laterality: Right;   SEPTOPLASTY     SHOULDER ARTHROSCOPY WITH ROTATOR CUFF REPAIR Left    SLAP Tear Repair  2008   TONSILLECTOMY     TOTAL KNEE ARTHROPLASTY Left 12/08/2016   Procedure: LEFT TOTAL KNEE ARTHROPLASTY;  Surgeon: Ollen Gross, MD;  Location: [facility];  Service: Orthopedics;  Laterality: Left;   TOTAL KNEE ARTHROPLASTY WITH REVISION COMPONENTS Left 04/21/2018   Procedure: Left knee polyethylene exchange ;  Surgeon: Ollen Gross, MD;  Location: [facility];  Service: Orthopedics;  Laterality: Left;   Patient Active Problem List   Diagnosis Date Noted   Rectal bleed 11/10/2021   BRBPR (bright red blood per rectum) 11/09/2021   Controlled type 2 diabetes mellitus without complication, without long-term current use of insulin (HCC) 11/09/2021   Hyperlipidemia 11/09/2021   Chronic musculoskeletal pain 11/09/2021   Oropharyngeal candidiasis 11/09/2021   Hoarse voice quality 11/09/2021   Near syncope 11/09/2021   Acute lower GI bleeding 11/08/2021   Right shoulder injury 06/15/2018   S/P arthroscopy of right shoulder 06/15/2018   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   OA (osteoarthritis) of knee 12/08/2016   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 without status migrainosus 12/17/2015  Cervical facet joint syndrome 12/17/2015   Chronic bilateral low back pain without sciatica 12/17/2015   Primary osteoarthritis of left knee 12/17/2015    PCP:   Garlan Fillers, MD    REFERRING PROVIDER: Tressie Stalker, MD   REFERRING DIAG:  M51.36 (ICD-10-CM) - Other intervertebral disc degeneration, lumbar region  M54.2 (ICD-10-CM) - Cervicalgia    Rationale for Evaluation and Treatment: Rehabilitation  THERAPY DIAG:  Cervicalgia - Plan: PT plan of care cert/re-cert  Chronic low back pain with right-sided sciatica, unspecified back pain laterality - Plan: PT plan of care  cert/re-cert  Muscle weakness (generalized) - Plan: PT plan of care cert/re-cert  ONSET DATE: chronic   SUBJECTIVE:                                                                                                                                                                                           SUBJECTIVE STATEMENT: Pt reports good improvement in all areas of pain after the last session esp for the R suboccipital and HAs.   PERTINENT HISTORY:  Anxiety Chronic pain syndrome Degenerative disc disease Migraines Diabetes  Morbid obesity History of pulmonary embolism Recurrent DVTS, chronic anticoagulation     PAIN:  Are you having pain? Yes: NPRS scale: 4/10 Pain location: low back Pain description: muscle spasm Aggravating factors: sitting upright, standing, walking Relieving factors: chripractor, massage  Are you having pain? Yes: NPRS scale: 6/10 Pain location: neck Pain description: tight band Aggravating factors: working at the computer Relieving factors: ice, medication   PRECAUTIONS: None  WEIGHT BEARING RESTRICTIONS: No  FALLS:  Has patient fallen in last 6 months? No  LIVING ENVIRONMENT: Lives with: lives with their family Lives in: House/apartment Stairs: Yes: Internal: 14 steps; on left going up Has following equipment at home: None  OCCUPATION: MD  PLOF: Independent  PATIENT GOALS: "I want to be back to where I was before I was in the [employer]."    OBJECTIVE:   DIAGNOSTIC FINDINGS:  Lumbar MRI: IMPRESSION: 1. No acute osseous abnormality in the lumbar spine. 2. Intermittent lower thoracic and lumbar chronic disc and endplate degeneration. Mild lumbar facet hypertrophy. No lumbar spinal stenosis or convincing lumbar neural impingement. Mild to moderate bilateral T10 and right T11 neural foraminal stenosis.  Cervical MRI: IMPRESSION: 1. No acute osseous abnormality. Multilevel cervical spine degeneration but capacious spinal  canal. No spinal stenosis. 2. Generally mild cervical neural foraminal stenosis due to facet and/or disc osteophyte degeneration. Up to moderate left C8 nerve level foraminal stenosis.  PATIENT SURVEYS:  FOTO Neck 57% function to 61% predicted;  lumbar 46% function to 51% predicted  SCREENING FOR RED FLAGS: Bowel or bladder incontinence: No Spinal tumors: No Cauda equina syndrome: No Compression fracture: No Abdominal aneurysm: No  COGNITION: Overall cognitive status: Within functional limits for tasks assessed     SENSATION: WFL  MUSCLE LENGTH: Hamstrings: moderate tightness bilaterally   POSTURE: rounded shoulders, forward head, and decreased lumbar lordosis  PALPATION: TTP cervical paraspinals, Rt upper trap, lumbar paraspinals   LUMBAR ROM:   AROM eval  Flexion WNL  Extension 50% limited   Right lateral flexion WNL  Left lateral flexion WNL  Right rotation WNL  Left rotation WNL   (Blank rows = not tested) pain with all lumbar AROM, most noted with extension   LOWER EXTREMITY ROM:     Active  Right eval Left eval  Hip flexion    Hip extension    Hip abduction    Hip adduction    Hip internal rotation    Hip external rotation    Knee flexion    Knee extension    Ankle dorsiflexion    Ankle plantarflexion    Ankle inversion    Ankle eversion     (Blank rows = not tested)  LOWER EXTREMITY MMT:    MMT Right eval Left eval  Hip flexion 4- 5  Hip extension 4 4  Hip abduction 4 4  Hip adduction    Hip internal rotation    Hip external rotation    Knee flexion 4+   Knee extension 5 5  Ankle dorsiflexion 4+ 5  Ankle plantarflexion 5 in sitting 5  Ankle inversion    Ankle eversion 4+ 5   (Blank rows = not tested) CERVICAL ROM:   Active ROM A/PROM (deg) eval AROM 03/05/23 AROM  Flexion 40    Extension 50    Right lateral flexion 20    Left lateral flexion 30    Right rotation 62    Left rotation 50  55   (Blank rows = not tested) pain  with all cervical AROM UPPER EXTREMITY MMT:  MMT Right eval Left eval  Shoulder flexion 4 5  Shoulder extension    Shoulder abduction 5 5  Shoulder adduction    Shoulder extension    Shoulder internal rotation    Shoulder external rotation    Middle trapezius 4 4  Lower trapezius    Elbow flexion    Elbow extension    Wrist flexion    Wrist extension    Wrist ulnar deviation    Wrist radial deviation    Wrist pronation    Wrist supination    Grip strength     (Blank rows = not tested)  SPECIAL TESTS:  (+) Cervical compression/distraction  (+) SLR  FUNCTIONAL TESTS:  Squat: WBOS, poor hinge; increased pain    GAIT: Distance walked: 10 ft  Assistive device utilized: None Level of assistance: Complete Independence Comments: WNL  [facility] Adult PT Treatment:                                                DATE: 7/924 Therapeutic Exercise: Supine chin tucks x5 5" Supine neck lift offs x5 5" Upper trap stretch x2 15" Prone arm Y's x10 Prone leg lifts x10 Prone alt arm/leg lifts x10 Manual Therapy: STM to the upper traps Skilled palpation to ID TrPs and taut muscle bands  Trigger Point Dry-Needling  Treatment instructions: Expect mild to moderate muscle soreness. S/S of pneumothorax if dry needled over a lung field, and to seek immediate medical attention should they occur. Patient verbalized understanding of these instructions and education.  Patient Consent Given: Yes Education handout provided: Previously provided Muscles treated: R thoracolumbar paraspinals, R upper trap,  Electrical stimulation performed: NO Treatment response/outcome: twitch response and relief of tension.  Therapeutic Exercise: *** Manual Therapy: *** Neuromuscular re-ed: *** Therapeutic Activity: *** Modalities: *** Self Care: ***  [facility] Adult PT Treatment:                                                DATE: 04/14/2023 Therapeutic Exercise: Supine chin tucks x5 5" Supine neck lift  offs x5 5" Upper trap stretch x2 15" Prone arm Y's x10 Prone leg lifts x10 Prone alt arm/leg lifts x10 Manual Therapy: STM to the upper traps Skilled palpation to ID TrPs and taut muscle bands  Trigger Point Dry-Needling  Treatment instructions: Expect mild to moderate muscle soreness. S/S of pneumothorax if dry needled over a lung field, and to seek immediate medical attention should they occur. Patient verbalized understanding of these instructions and education.  Patient Consent Given: Yes Education handout provided: Previously provided Muscles treated: R thoracolumbar paraspinals, R upper trap,  Electrical stimulation performed: NO Treatment response/outcome: twitch response and relief of tension.   [facility] Adult PT Treatment:                                                DATE: 04/08/2023 Therapeutic Exercise: Supine chin tucks x5 5" Supine neck lift offs x5 5" Prone arm Y's x10 Prone leg lifts x10 Prone alt arm/leg lifts x10 Prone contralateral UE/LE 2 x 10 Self care: Instruction in the use of suboccipital massage using 2 taped tennis balls. Pt practiced and returned demonstration Manual Therapy: STM to the upper traps and suboccipitals Suboccipital release  Skilled palpation to ID TrPs and taut muscle bands  Trigger Point Dry-Needling  Treatment instructions: Expect mild to moderate muscle soreness. S/S of pneumothorax if dry needled over a lung field, and to seek immediate medical attention should they occur. Patient verbalized understanding of these instructions and education.  Patient Consent Given: Yes Education handout provided: Previously provided Muscles treated: R thoracolumbar paraspinals, R upper trap,  Electrical stimulation performed: NO Treatment response/outcome: twitch response and relief of tension.   [facility] Adult PT Treatment:                                                DATE: 03/05/2023 Therapeutic Exercise: Prone elbows 1 x 20 Prone press up 1 x  20 Prone contralateral UE/LE 2 x 10 Quadruped I'S T's and y's 1 x 10 with contralateral side staying ina protracted position.   Updated HEP today to for prone alternating UE/LE, and prone I's, T's and Y's  Manual Therapy: IASTM along R lumbar paraspinals and R upper trap KT taping for the back for inhibition  Trigger Point Dry-Needling  Treatment instructions: Expect mild to moderate muscle soreness. S/S of pneumothorax if dry needled over  a lung field, and to seek immediate medical attention should they occur. Patient verbalized understanding of these instructions and education.  Patient Consent Given: Yes Education handout provided: Previously provided Muscles treated: R thoracolumbar paraspinals, R upper trap Electrical stimulation performed: Yes Parameters:  CPS L 20 x 8 min, adjusting intensity to tolerance Treatment response/outcome: twitch response and relief of tension.    PATIENT EDUCATION:  Education details: see treatment Person educated: Patient Education method: Explanation, Demonstration, Tactile cues, Verbal cues, and Handouts Education comprehension: verbalized understanding, returned demonstration, verbal cues required, tactile cues required, and needs further education  HOME EXERCISE PROGRAM: Access Code: [email or web address] URL: [email or web address] Date: 02/04/2023 Prepared by: Letitia Libra  Exercises - Supine Double Knee to Chest  - 2 x daily - 7 x weekly - 1 sets - 10 reps - 5 sec  hold - Hooklying Clamshell with Resistance  - 1 x daily - 7 x weekly - 2 sets - 10 reps - Supine Chin Tuck  - 2 x daily - 7 x weekly - 2 sets - 10 reps  ASSESSMENT:  CLINICAL IMPRESSION: Pt's time in PT has been limited by L hand surgery in May. After pt's 1st appt back in PT last week, he reports improvement with his HAs, and cervical and low back pain. L cervical rotation was reassessed and found minimally improved. Manual therapy was completed as noted above for upper  traps f/b TPDN to the R upper trap and to the thoracolumbar paraspinals. Therex for postural and posterior chain strengthening for the neck and trunk were then completed for muscle activation. Pt tolerated PT today without adverse effects. Pt will continue to benefit from skilled PT 2w4 to address impairments for improved function with less pain.  Evaluation: Patient is a 62 y.o. male well known to our clinic who was seen today for physical therapy evaluation and treatment for chronic neck and back pain that has been ongoing for years. Upon assessment he is noted to have slightly limited cervical AROM reporting pain with all ranges and limited and painful lumbar extension AROM, bilateral hip weakness, Rt knee and ankle weakness, postural abnormalities, and aberrant lifting mechanics. He will benefit from skilled PT to address the above stated deficits in order to optimize his function and assist in overall pain reduction.   OBJECTIVE IMPAIRMENTS: decreased activity tolerance, decreased endurance, difficulty walking, decreased ROM, decreased strength, increased fascial restrictions, impaired UE functional use, improper body mechanics, postural dysfunction, and pain.   ACTIVITY LIMITATIONS: carrying, lifting, bending, sitting, standing, squatting, and locomotion level  PARTICIPATION LIMITATIONS: meal prep, cleaning, laundry, driving, shopping, community activity, and yard work  PERSONAL FACTORS: Age, Fitness, Profession, Time since onset of injury/illness/exacerbation, and 3+ comorbidities: see PMH above  are also affecting patient's functional outcome.   REHAB POTENTIAL: Fair chronicity of injury, co-morbidities, previous PT  CLINICAL DECISION MAKING: Evolving/moderate complexity  EVALUATION COMPLEXITY: Moderate   GOALS: Goals reviewed with patient? Yes  SHORT TERM GOALS: Target date: 03/04/2023   Patient will be independent and compliant with initial HEP.  Baseline: see above Goal status:  Ind  2.  Patient will demonstrate at least 60 degrees of left cervical rotation AROM to improve ability to complete head turns while driving.  Baseline: see above  Status: L rotation 55d Goal status: Improved, but not met  3.  Patient will demonstrate proper hip hinge to reduce stress on back with lifting activity.  Baseline: see above  Goal status: No met  LONG TERM GOALS: Target date: 05/12/2023  Patient will score at least 61% neck and 51% lumbar on FOTO to signify clinically meaningful improvement in functional abilities.   Baseline: see above Goal status: Ongoing  2.  Patient will be independent with advanced home program to assist in management of his chronic conditions.  Baseline: see above Goal status: Ongoing  3.  Patient will demonstrate 5/5 hip strength to improve stability about the chain with prolonged walking/standing activity.  Baseline: see above Goal status: Ongoing  4.  Patient will be able to lift at least 20 lbs with minimal pain.  Baseline: see above Goal status: Ogoing  5. Patient will demonstrate 5/5 bilateral middle trap strength to improve postural stability.   Baseline:see above  Goal status: Ongoing  PLAN:  PT FREQUENCY: 2x/week  PT DURATION: 4 weeks  PLANNED INTERVENTIONS: Therapeutic exercises, Therapeutic activity, Neuromuscular re-education, Balance training, Gait training, Patient/Family education, Self Care, Aquatic Therapy, Dry Needling, Electrical stimulation, Cryotherapy, Moist heat, Traction, Manual therapy, and Re-evaluation.  PLAN FOR NEXT SESSION: review and progress HEP prn; cervical and lumbar mobility, postural and core strengthening.   Amoria Mclees MS, PT 04/14/23 6:26 PM

## 2023-04-21 ENCOUNTER — Ambulatory Visit: Payer: 59

## 2023-04-21 DIAGNOSIS — R293 Abnormal posture: Secondary | ICD-10-CM | POA: Diagnosis not present

## 2023-04-21 DIAGNOSIS — M5441 Lumbago with sciatica, right side: Secondary | ICD-10-CM | POA: Diagnosis not present

## 2023-04-21 DIAGNOSIS — M542 Cervicalgia: Secondary | ICD-10-CM | POA: Diagnosis not present

## 2023-04-21 DIAGNOSIS — G8929 Other chronic pain: Secondary | ICD-10-CM

## 2023-04-21 DIAGNOSIS — M6281 Muscle weakness (generalized): Secondary | ICD-10-CM

## 2023-04-21 DIAGNOSIS — M25511 Pain in right shoulder: Secondary | ICD-10-CM | POA: Diagnosis not present

## 2023-04-22 DIAGNOSIS — M25642 Stiffness of left hand, not elsewhere classified: Secondary | ICD-10-CM | POA: Diagnosis not present

## 2023-04-23 ENCOUNTER — Ambulatory Visit: Payer: 59

## 2023-05-05 ENCOUNTER — Ambulatory Visit: Payer: 59

## 2023-05-06 DIAGNOSIS — M9902 Segmental and somatic dysfunction of thoracic region: Secondary | ICD-10-CM | POA: Diagnosis not present

## 2023-05-06 DIAGNOSIS — M9901 Segmental and somatic dysfunction of cervical region: Secondary | ICD-10-CM | POA: Diagnosis not present

## 2023-05-06 DIAGNOSIS — M9904 Segmental and somatic dysfunction of sacral region: Secondary | ICD-10-CM | POA: Diagnosis not present

## 2023-05-06 DIAGNOSIS — M9903 Segmental and somatic dysfunction of lumbar region: Secondary | ICD-10-CM | POA: Diagnosis not present

## 2023-05-07 ENCOUNTER — Ambulatory Visit: Payer: 59

## 2023-05-11 NOTE — Therapy (Signed)
OUTPATIENT PHYSICAL THERAPY TREATMENT/Re-Cert   Patient Name: Daniel KISSAM, MD MRN: [id number] DOB:Aug 08, 1961,62 y.o., male Today's Date: 05/12/2023   END OF SESSION:  PT End of Session - 05/12/23 1723     Visit Number 7    Number of Visits 17    Date for PT Re-Evaluation 06/26/23    Authorization Type [employer]    PT Start Time 1632    PT Stop Time 1720    PT Time Calculation (min) 48 min    Activity Tolerance Patient tolerated treatment well    Behavior During Therapy WFL for tasks assessed/performed                 Past Medical History:  Diagnosis Date   Anxiety    Aortic atherosclerosis (HCC)    BPH (benign prostatic hyperplasia)    Chronic pain syndrome    neck and low back   DDD (degenerative disc disease), cervical    Diverticulosis    DJD (degenerative joint disease)    Ganglion cyst    12 mm , right posterior knee   Hyperlipidemia    Internal hemorrhoids    Migraines    OA (osteoarthritis)    knees, hands, right shoulder   Positive QuantiFERON-TB Gold test    Shoulder pain    Tear of right rotator cuff    Type 2 diabetes mellitus (HCC)    last A1c 5.4 on 04-13-2018 in epic (followed by pcp)   Wears glasses    Past Surgical History:  Procedure Laterality Date   ANAL FISSURE REPAIR     ARTHOSCOPIC ROTAOR CUFF REPAIR Right 06/15/2018   Procedure: RIGHT SHOULDER ARTHROSCOPY, DEBRIDEMENT, SUBACROMIAL DECOMPRESSION, DISTAL CLAVICLE RESECTION, ROTATOR CUFF REPAIR;  Surgeon: Eugenia Mcalpine, MD;  Location: [facility] [place];  Service: Orthopedics;  Laterality: Right;   COLONOSCOPY  05-26-2017   dr Dineen Kid SIGMOIDOSCOPY N/A 11/05/2021   Procedure: FLEXIBLE SIGMOIDOSCOPY;  Surgeon: Rachael Fee, MD;  Location: [facility];  Service: Endoscopy;  Laterality: N/A;   Hip Resurface  2006   KNEE ARTHROSCOPY W/ MENISCAL REPAIR Bilateral right 1993; left 2010   MASS EXCISION Right 04/21/2018   Procedure: EXCISION RIGHT THIGH SOFT TISSUE  MASS;  Surgeon: Ollen Gross, MD;  Location: [facility];  Service: Orthopedics;  Laterality: Right;   SEPTOPLASTY     SHOULDER ARTHROSCOPY WITH ROTATOR CUFF REPAIR Left    SLAP Tear Repair  2008   TONSILLECTOMY     TOTAL KNEE ARTHROPLASTY Left 12/08/2016   Procedure: LEFT TOTAL KNEE ARTHROPLASTY;  Surgeon: Ollen Gross, MD;  Location: [facility];  Service: Orthopedics;  Laterality: Left;   TOTAL KNEE ARTHROPLASTY WITH REVISION COMPONENTS Left 04/21/2018   Procedure: Left knee polyethylene exchange ;  Surgeon: Ollen Gross, MD;  Location: [facility];  Service: Orthopedics;  Laterality: Left;   Patient Active Problem List   Diagnosis Date Noted   Rectal bleed 11/10/2021   BRBPR (bright red blood per rectum) 11/09/2021   Controlled type 2 diabetes mellitus without complication, without long-term current use of insulin (HCC) 11/09/2021   Hyperlipidemia 11/09/2021   Chronic musculoskeletal pain 11/09/2021   Oropharyngeal candidiasis 11/09/2021   Hoarse voice quality 11/09/2021   Near syncope 11/09/2021   Acute lower GI bleeding 11/08/2021   Right shoulder injury 06/15/2018   S/P arthroscopy of right shoulder 06/15/2018   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   OA (osteoarthritis) of knee 12/08/2016   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 without status migrainosus  12/17/2015   Cervical facet joint syndrome 12/17/2015   Chronic bilateral low back pain without sciatica 12/17/2015   Primary osteoarthritis of left knee 12/17/2015    PCP:   Garlan Fillers, MD    REFERRING PROVIDER: Tressie Stalker, MD   REFERRING DIAG:  M51.36 (ICD-10-CM) - Other intervertebral disc degeneration, lumbar region  M54.2 (ICD-10-CM) - Cervicalgia    Rationale for Evaluation and Treatment: Rehabilitation  THERAPY DIAG:  Cervicalgia  Chronic low back pain with right-sided sciatica, unspecified back pain laterality  Muscle weakness (generalized)  Chronic right shoulder  pain  Abnormal posture  ONSET DATE: chronic   SUBJECTIVE:                                                                                                                                                                                           SUBJECTIVE STATEMENT: Pt reports he has been doing well. The HEP have been helpful. Overall, my R neck/shoulder pain is better, approx 50%.   PERTINENT HISTORY:  Anxiety Chronic pain syndrome Degenerative disc disease Migraines Diabetes  Morbid obesity History of pulmonary embolism Recurrent DVTS, chronic anticoagulation     PAIN:  Are you having pain? Yes: NPRS scale: 5/10 Pain location: low back Pain description: muscle spasm Aggravating factors: sitting upright, standing, walking Relieving factors: chripractor, massage  Are you having pain? Yes: NPRS scale: 5/10 Pain location: neck Pain description: tight band Aggravating factors: working at the computer Relieving factors: ice, medication   PRECAUTIONS: None  WEIGHT BEARING RESTRICTIONS: No  FALLS:  Has patient fallen in last 6 months? No  LIVING ENVIRONMENT: Lives with: lives with their family Lives in: House/apartment Stairs: Yes: Internal: 14 steps; on left going up Has following equipment at home: None  OCCUPATION: MD  PLOF: Independent  PATIENT GOALS: "I want to be back to where I was before I was in the [employer]."    OBJECTIVE:   DIAGNOSTIC FINDINGS:  Lumbar MRI: IMPRESSION: 1. No acute osseous abnormality in the lumbar spine. 2. Intermittent lower thoracic and lumbar chronic disc and endplate degeneration. Mild lumbar facet hypertrophy. No lumbar spinal stenosis or convincing lumbar neural impingement. Mild to moderate bilateral T10 and right T11 neural foraminal stenosis.  Cervical MRI: IMPRESSION: 1. No acute osseous abnormality. Multilevel cervical spine degeneration but capacious spinal canal. No spinal stenosis. 2. Generally mild  cervical neural foraminal stenosis due to facet and/or disc osteophyte degeneration. Up to moderate left C8 nerve level foraminal stenosis.  PATIENT SURVEYS:  FOTO Neck 57% function to 61% predicted;  lumbar 46% function to 51% predicted   SCREENING FOR RED FLAGS: Bowel or bladder  incontinence: No Spinal tumors: No Cauda equina syndrome: No Compression fracture: No Abdominal aneurysm: No  COGNITION: Overall cognitive status: Within functional limits for tasks assessed     SENSATION: WFL  MUSCLE LENGTH: Hamstrings: moderate tightness bilaterally   POSTURE: rounded shoulders, forward head, and decreased lumbar lordosis  PALPATION: TTP cervical paraspinals, Rt upper trap, lumbar paraspinals   LUMBAR ROM:   AROM eval  Flexion WNL  Extension 50% limited   Right lateral flexion WNL  Left lateral flexion WNL  Right rotation WNL  Left rotation WNL   (Blank rows = not tested) pain with all lumbar AROM, most noted with extension   LOWER EXTREMITY ROM:     Active  Right eval Left eval  Hip flexion    Hip extension    Hip abduction    Hip adduction    Hip internal rotation    Hip external rotation    Knee flexion    Knee extension    Ankle dorsiflexion    Ankle plantarflexion    Ankle inversion    Ankle eversion     (Blank rows = not tested)  LOWER EXTREMITY MMT:    MMT Right eval Left eval  Hip flexion 4- 5  Hip extension 4 4  Hip abduction 4 4  Hip adduction    Hip internal rotation    Hip external rotation    Knee flexion 4+   Knee extension 5 5  Ankle dorsiflexion 4+ 5  Ankle plantarflexion 5 in sitting 5  Ankle inversion    Ankle eversion 4+ 5   (Blank rows = not tested) CERVICAL ROM:   Active ROM A/PROM (deg) eval AROM 03/05/23 AROM 7/224 AROM 04/21/23  Flexion 40     Extension 50     Right lateral flexion 20     Left lateral flexion 30     Right rotation 62   65  Left rotation 50  55 60   (Blank rows = not tested) pain with all cervical  AROM UPPER EXTREMITY MMT:  MMT Right eval Left eval  Shoulder flexion 4 5  Shoulder extension    Shoulder abduction 5 5  Shoulder adduction    Shoulder extension    Shoulder internal rotation    Shoulder external rotation    Middle trapezius 4 4  Lower trapezius    Elbow flexion    Elbow extension    Wrist flexion    Wrist extension    Wrist ulnar deviation    Wrist radial deviation    Wrist pronation    Wrist supination    Grip strength     (Blank rows = not tested)  SPECIAL TESTS:  (+) Cervical compression/distraction  (+) SLR  FUNCTIONAL TESTS:  Squat: WBOS, poor hinge; increased pain    GAIT: Distance walked: 10 ft  Assistive device utilized: None Level of assistance: Complete Independence Comments: WNL  [facility] Adult PT Treatment:                                                DATE: 05/12/23 Therapeutic Exercise: Prone chin tucks x10 3" Prone arm H's x10 Prone I's x10 Prone leg lifts x10 Prone alt arm/leg lifts x10 Manual Therapy: STM/MTPR to the bilat upper traps and cervical paraspinals Skilled palpation to ID TrPs and taut muscle bands of the R lumbar paraspinals Modalities:  mA estim at 10 pps to the R lumbar paraspinals Trigger Point Dry-Needling  Treatment instructions: Expect mild to moderate muscle soreness. S/S of pneumothorax if dry needled over a lung field, and to seek immediate medical attention should they occur. Patient verbalized understanding of these instructions and education.  Patient Consent Given: Yes Education handout provided: Previously provided Muscles treated: R thoracolumbar paraspinals Electrical stimulation performed:Yes- see above Treatment response/outcome: twitch response and relief of tension.   [facility] Adult PT Treatment:                                                DATE: 7/924 Therapeutic Exercise: Supine chin tucks x10 3" Supine neck lift offs x10 5" Thoracic ext c soft roller in sitting Door way pectoral stretch  90d x3 20" Standing open books x10 Standing bilat D2 arm lifts at wall 2x10 Manual Therapy: STM to the upper traps and crvical paraspinals Manual cervical traction Skilled palpation to ID TrPs and taut muscle bands  Trigger Point Dry-Needling  Treatment instructions: Expect mild to moderate muscle soreness. S/S of pneumothorax if dry needled over a lung field, and to seek immediate medical attention should they occur. Patient verbalized understanding of these instructions and education.  Patient Consent Given: Yes Education handout provided: Previously provided Muscles treated: R upper trap, and lower cervical paraspinals Electrical stimulation performed: NO Treatment response/outcome: twitch response and relief of tension.   to seek immediate medical attention should they occur. Patient verbalized understanding of these instructions and education.  Patient Consent Given: Yes Education handout provided: Previously provided Muscles treated: R thoracolumbar paraspinals, R upper trap,  Electrical stimulation performed: NO Treatment response/outcome: twitch response and relief of tension.    PATIENT EDUCATION:  Education details: see treatment Person educated: Patient Education method: Explanation, Demonstration, Tactile cues, Verbal cues, and Handouts Education comprehension: verbalized understanding, returned demonstration, verbal cues required, tactile cues required, and needs further education  HOME EXERCISE PROGRAM: Access Code: [email or web address] URL: [email or web address] Date: 04/21/2023 Prepared by: Joellyn Rued  Exercises - Supine Double Knee to Chest  - 2 x daily - 7 x weekly - 1 sets - 10 reps - 5 sec  hold - Hooklying Clamshell with Resistance  - 1 x daily - 7 x weekly - 2 sets - 10 reps - Supine Chin Tuck  - 2 x daily - 7 x weekly - 2 sets - 10 reps - Supine DNF Liftoffs  - 1 x daily - 7 x weekly - 3 sets - 10 reps - Prone Press Up On Elbows  - 1 x daily - 7 x  weekly - 2-4 sets - 10-20 reps - Prone Off Center Lumbar Extension Press Up  - 1 x daily - 7 x weekly - 2-4 sets - 10-20 reps - Prone Alternating Arm and Leg Lifts  - 1 x daily - 7 x weekly - 2 sets - 10 reps - Shoulder Y's  - 1 x daily - 7 x weekly - 2 - 3 sets - 10 reps - Shoulder I's  - 1 x daily - 7 x weekly - 2 sets - 10 reps - Shoulder T's  - 1 x daily - 7 x weekly - 2-3 sets - 10 reps - Doorway Pec Stretch at 90 Degrees Abduction  - 1 x daily - 7 x weekly - 1 sets - 3 reps -  2 hold - Shoulder PNF D2 Flexion  - 1 x daily - 7 x weekly - 1 sets - 10 reps - 3 hold - Standing Thoracic Open Book at Wall  - 1 x daily - 7 x weekly - 3 sets - 10 reps - 3 hold  ASSESSMENT:  CLINICAL IMPRESSION:  Pt returns to PT after 3 weeks due his professional schedule and being sick. Pt reports good relief with his R neck/upper shoulder and low back pain with PT intervention. Approx 50%. Pt notes he has been completing his HEP and these exs have been helpful with lower and managing his pain. PT was completed for STM c MTPR to the cervical paraspinals and upper traps. TPDN c mA Estim was applied to the R low back paraspinals. Pt then completed therex for postural and posterior chain strengthening. Cervical ROM assessments have found improved mobility for rotation. Pt tolerated PT today without adverse effects. Will continue PT with focus on pt's low back for pain management, lumbopelvic strength, and lifting mechanics. Pt will continue to benefit from skilled PT to address impairments for improved function.   Evaluation: Patient is a 62 y.o. male well known to our clinic who was seen today for physical therapy evaluation and treatment for chronic neck and back pain that has been ongoing for years. Upon assessment he is noted to have slightly limited cervical AROM reporting pain with all ranges and limited and painful lumbar extension AROM, bilateral hip weakness, Rt knee and ankle weakness, postural abnormalities,  and aberrant lifting mechanics. He will benefit from skilled PT to address the above stated deficits in order to optimize his function and assist in overall pain reduction.   OBJECTIVE IMPAIRMENTS: decreased activity tolerance, decreased endurance, difficulty walking, decreased ROM, decreased strength, increased fascial restrictions, impaired UE functional use, improper body mechanics, postural dysfunction, and pain.   ACTIVITY LIMITATIONS: carrying, lifting, bending, sitting, standing, squatting, and locomotion level  PARTICIPATION LIMITATIONS: meal prep, cleaning, laundry, driving, shopping, community activity, and yard work  PERSONAL FACTORS: Age, Fitness, Profession, Time since onset of injury/illness/exacerbation, and 3+ comorbidities: see PMH above  are also affecting patient's functional outcome.   REHAB POTENTIAL: Fair chronicity of injury, co-morbidities, previous PT  CLINICAL DECISION MAKING: Evolving/moderate complexity  EVALUATION COMPLEXITY: Moderate   GOALS: Goals reviewed with patient? Yes  SHORT TERM GOALS: Target date: 03/04/2023   Patient will be independent and compliant with initial HEP.  Baseline: see above Goal status: Ind  2.  Patient will demonstrate at least 60 degrees of left cervical rotation AROM to improve ability to complete head turns while driving.  Baseline: see above  Status: L rotation 55d Goal status: Improved, but not met  3.  Patient will demonstrate proper hip hinge to reduce stress on back with lifting activity.  Baseline: see above  Goal status: No met  LONG TERM GOALS: Target date: 06/26/2023  Patient will score at least 61% neck and 51% lumbar on FOTO to signify clinically meaningful improvement in functional abilities.   Baseline: see above Goal status: Ongoing  2.  Patient will be independent with advanced home program to assist in management of his chronic conditions.  Baseline: see above Goal status: Ongoing  3.  Patient will  demonstrate 5/5 hip strength to improve stability about the chain with prolonged walking/standing activity.  Baseline: see above Goal status: Ongoing  4.  Patient will be able to lift at least 20 lbs with minimal pain.  Baseline: see above Goal status: Ogoing  5. Patient will demonstrate 5/5 bilateral middle trap strength to improve postural stability.   Baseline:see above  Goal status: Ongoing  PLAN:  PT FREQUENCY: 2x/week  PT DURATION: 4 weeks  PLANNED INTERVENTIONS: Therapeutic exercises, Therapeutic activity, Neuromuscular re-education, Balance training, Gait training, Patient/Family education, Self Care, Aquatic Therapy, Dry Needling, Electrical stimulation, Cryotherapy, Moist heat, Traction, Manual therapy, and Re-evaluation.  PLAN FOR NEXT SESSION: review and progress HEP prn; cervical and lumbar mobility, postural and core strengthening.   Chandani Rogowski MS, PT 05/12/23 6:00 PM

## 2023-05-12 ENCOUNTER — Ambulatory Visit: Payer: 59

## 2023-05-12 DIAGNOSIS — M6281 Muscle weakness (generalized): Secondary | ICD-10-CM | POA: Diagnosis not present

## 2023-05-12 DIAGNOSIS — M5441 Lumbago with sciatica, right side: Secondary | ICD-10-CM | POA: Diagnosis not present

## 2023-05-12 DIAGNOSIS — G8929 Other chronic pain: Secondary | ICD-10-CM | POA: Diagnosis not present

## 2023-05-12 DIAGNOSIS — R293 Abnormal posture: Secondary | ICD-10-CM

## 2023-05-12 DIAGNOSIS — M542 Cervicalgia: Secondary | ICD-10-CM

## 2023-05-12 DIAGNOSIS — M25511 Pain in right shoulder: Secondary | ICD-10-CM | POA: Diagnosis not present

## 2023-05-14 ENCOUNTER — Ambulatory Visit: Payer: 59 | Attending: Neurosurgery

## 2023-05-14 DIAGNOSIS — G8929 Other chronic pain: Secondary | ICD-10-CM

## 2023-05-14 DIAGNOSIS — M6281 Muscle weakness (generalized): Secondary | ICD-10-CM | POA: Diagnosis not present

## 2023-05-14 DIAGNOSIS — M25511 Pain in right shoulder: Secondary | ICD-10-CM | POA: Diagnosis not present

## 2023-05-14 DIAGNOSIS — M542 Cervicalgia: Secondary | ICD-10-CM | POA: Diagnosis not present

## 2023-05-14 DIAGNOSIS — R293 Abnormal posture: Secondary | ICD-10-CM

## 2023-05-14 DIAGNOSIS — M5441 Lumbago with sciatica, right side: Secondary | ICD-10-CM | POA: Insufficient documentation

## 2023-05-14 NOTE — Therapy (Signed)
OUTPATIENT PHYSICAL THERAPY TREATMENT/Re-Cert   Patient Name: Daniel OETKEN, MD MRN: [id number] DOB:[birth year],62 y.o., male Today's Date: 05/14/2023   END OF SESSION:  PT End of Session - 05/14/23 1543     Visit Number 8    Number of Visits 17    Date for PT Re-Evaluation 06/26/23    Authorization Type [employer]    PT Start Time 1545    PT Stop Time 1627    PT Time Calculation (min) 42 min    Activity Tolerance Patient tolerated treatment well    Behavior During Therapy WFL for tasks assessed/performed                  Past Medical History:  Diagnosis Date   Anxiety    Aortic atherosclerosis (HCC)    BPH (benign prostatic hyperplasia)    Chronic pain syndrome    neck and low back   DDD (degenerative disc disease), cervical    Diverticulosis    DJD (degenerative joint disease)    Ganglion cyst    12 mm , right posterior knee   Hyperlipidemia    Internal hemorrhoids    Migraines    OA (osteoarthritis)    knees, hands, right shoulder   Positive QuantiFERON-TB Gold test    Shoulder pain    Tear of right rotator cuff    Type 2 diabetes mellitus (HCC)    last A1c 5.4 on 04-13-2018 in epic (followed by pcp)   Wears glasses    Past Surgical History:  Procedure Laterality Date   ANAL FISSURE REPAIR     ARTHOSCOPIC ROTAOR CUFF REPAIR Right 06/15/2018   Procedure: RIGHT SHOULDER ARTHROSCOPY, DEBRIDEMENT, SUBACROMIAL DECOMPRESSION, DISTAL CLAVICLE RESECTION, ROTATOR CUFF REPAIR;  Surgeon: Eugenia Mcalpine, MD;  Location: [facility];  Service: Orthopedics;  Laterality: Right;   COLONOSCOPY  05-26-2017   dr Dineen Kid SIGMOIDOSCOPY N/A 11/05/2021   Procedure: FLEXIBLE SIGMOIDOSCOPY;  Surgeon: Rachael Fee, MD;  Location: [facility];  Service: Endoscopy;  Laterality: N/A;   Hip Resurface  2006   KNEE ARTHROSCOPY W/ MENISCAL REPAIR Bilateral right 1993; left 2010   MASS EXCISION Right 04/21/2018   Procedure: EXCISION RIGHT THIGH SOFT TISSUE  MASS;  Surgeon: Ollen Gross, MD;  Location: [facility];  Service: Orthopedics;  Laterality: Right;   SEPTOPLASTY     SHOULDER ARTHROSCOPY WITH ROTATOR CUFF REPAIR Left    SLAP Tear Repair  2008   TONSILLECTOMY     TOTAL KNEE ARTHROPLASTY Left 12/08/2016   Procedure: LEFT TOTAL KNEE ARTHROPLASTY;  Surgeon: Ollen Gross, MD;  Location: [facility];  Service: Orthopedics;  Laterality: Left;   TOTAL KNEE ARTHROPLASTY WITH REVISION COMPONENTS Left 04/21/2018   Procedure: Left knee polyethylene exchange ;  Surgeon: Ollen Gross, MD;  Location: [facility];  Service: Orthopedics;  Laterality: Left;   Patient Active Problem List   Diagnosis Date Noted   Rectal bleed 11/10/2021   BRBPR (bright red blood per rectum) 11/09/2021   Controlled type 2 diabetes mellitus without complication, without long-term current use of insulin (HCC) 11/09/2021   Hyperlipidemia 11/09/2021   Chronic musculoskeletal pain 11/09/2021   Oropharyngeal candidiasis 11/09/2021   Hoarse voice quality 11/09/2021   Near syncope 11/09/2021   Acute lower GI bleeding 11/08/2021   Right shoulder injury 06/15/2018   S/P arthroscopy of right shoulder 06/15/2018   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   OA (osteoarthritis) of knee 12/08/2016   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 without status  migrainosus 12/17/2015   Cervical facet joint syndrome 12/17/2015   Chronic bilateral low back pain without sciatica 12/17/2015   Primary osteoarthritis of left knee 12/17/2015    PCP:   Garlan Fillers, MD    REFERRING PROVIDER: Tressie Stalker, MD   REFERRING DIAG:  M51.36 (ICD-10-CM) - Other intervertebral disc degeneration, lumbar region  M54.2 (ICD-10-CM) - Cervicalgia    Rationale for Evaluation and Treatment: Rehabilitation  THERAPY DIAG:  Cervicalgia  Chronic low back pain with right-sided sciatica, unspecified back pain laterality  Muscle weakness (generalized)  Chronic right shoulder  pain  Abnormal posture  ONSET DATE: chronic   SUBJECTIVE:                                                                                                                                                                                           SUBJECTIVE STATEMENT: Pt reports he has been doing well. The HEP have been helpful. Overall, my R neck/shoulder pain is better, approx 50%.   PERTINENT HISTORY:  Anxiety Chronic pain syndrome Degenerative disc disease Migraines Diabetes  Morbid obesity History of pulmonary embolism Recurrent DVTS, chronic anticoagulation     PAIN:  Are you having pain? Yes: NPRS scale: 5/10 Pain location: low back Pain description: muscle spasm Aggravating factors: sitting upright, standing, walking Relieving factors: chripractor, massage  Are you having pain? Yes: NPRS scale: 6/10 Pain location: neck Pain description: tight band Aggravating factors: working at the computer Relieving factors: ice, medication   PRECAUTIONS: None  WEIGHT BEARING RESTRICTIONS: No  FALLS:  Has patient fallen in last 6 months? No  LIVING ENVIRONMENT: Lives with: lives with their family Lives in: House/apartment Stairs: Yes: Internal: 14 steps; on left going up Has following equipment at home: None  OCCUPATION: MD  PLOF: Independent  PATIENT GOALS: "I want to be back to where I was before I was in the [employer]."    OBJECTIVE:   DIAGNOSTIC FINDINGS:  Lumbar MRI: IMPRESSION: 1. No acute osseous abnormality in the lumbar spine. 2. Intermittent lower thoracic and lumbar chronic disc and endplate degeneration. Mild lumbar facet hypertrophy. No lumbar spinal stenosis or convincing lumbar neural impingement. Mild to moderate bilateral T10 and right T11 neural foraminal stenosis.  Cervical MRI: IMPRESSION: 1. No acute osseous abnormality. Multilevel cervical spine degeneration but capacious spinal canal. No spinal stenosis. 2. Generally mild  cervical neural foraminal stenosis due to facet and/or disc osteophyte degeneration. Up to moderate left C8 nerve level foraminal stenosis.  PATIENT SURVEYS:  FOTO Neck 57% function to 61% predicted;  lumbar 46% function to 51% predicted   SCREENING FOR RED FLAGS: Bowel or  bladder incontinence: No Spinal tumors: No Cauda equina syndrome: No Compression fracture: No Abdominal aneurysm: No  COGNITION: Overall cognitive status: Within functional limits for tasks assessed     SENSATION: WFL  MUSCLE LENGTH: Hamstrings: moderate tightness bilaterally   POSTURE: rounded shoulders, forward head, and decreased lumbar lordosis  PALPATION: TTP cervical paraspinals, Rt upper trap, lumbar paraspinals   LUMBAR ROM:   AROM eval  Flexion WNL  Extension 50% limited   Right lateral flexion WNL  Left lateral flexion WNL  Right rotation WNL  Left rotation WNL   (Blank rows = not tested) pain with all lumbar AROM, most noted with extension   LOWER EXTREMITY ROM:     Active  Right eval Left eval  Hip flexion    Hip extension    Hip abduction    Hip adduction    Hip internal rotation    Hip external rotation    Knee flexion    Knee extension    Ankle dorsiflexion    Ankle plantarflexion    Ankle inversion    Ankle eversion     (Blank rows = not tested)  LOWER EXTREMITY MMT:    MMT Right eval Left eval  Hip flexion 4- 5  Hip extension 4 4  Hip abduction 4 4  Hip adduction    Hip internal rotation    Hip external rotation    Knee flexion 4+   Knee extension 5 5  Ankle dorsiflexion 4+ 5  Ankle plantarflexion 5 in sitting 5  Ankle inversion    Ankle eversion 4+ 5   (Blank rows = not tested) CERVICAL ROM:   Active ROM A/PROM (deg) eval AROM 03/05/23 AROM 7/224 AROM 04/21/23  Flexion 40     Extension 50     Right lateral flexion 20     Left lateral flexion 30     Right rotation 62   65  Left rotation 50  55 60   (Blank rows = not tested) pain with all cervical  AROM UPPER EXTREMITY MMT:  MMT Right eval Left eval  Shoulder flexion 4 5  Shoulder extension    Shoulder abduction 5 5  Shoulder adduction    Shoulder extension    Shoulder internal rotation    Shoulder external rotation    Middle trapezius 4 4  Lower trapezius    Elbow flexion    Elbow extension    Wrist flexion    Wrist extension    Wrist ulnar deviation    Wrist radial deviation    Wrist pronation    Wrist supination    Grip strength     (Blank rows = not tested)  SPECIAL TESTS:  (+) Cervical compression/distraction  (+) SLR  FUNCTIONAL TESTS:  Squat: WBOS, poor hinge; increased pain    GAIT: Distance walked: 10 ft  Assistive device utilized: None Level of assistance: Complete Independence Comments: WNL  [facility] Adult PT Treatment:                                                DATE: 05/14/23 Therapeutic Exercise: Nustep 5 mins L7 UE/LE Standing chin tucks x10 3" Standing shoulder hor abd star pattern 2x5 GTB Standing shoulder ER star pattern 2x5 GTB Cybex leg press 2x10 140# Cybex Hip ext 2x10 each 50# Therapeutic Activity: FOTO- re-assessed and reviewed  [facility] Adult PT  Treatment:                                                DATE: 05/12/23 Therapeutic Exercise: Standing chin tucks x10 3" Standing shoulder hor abd star pattern 2x5 GTB Standing shoulder ER star pattern 2x5 GTB Cybex leg press 2x10 Manual Therapy: STM/MTPR to the bilat upper traps and cervical paraspinals Skilled palpation to ID TrPs and taut muscle bands of the R lumbar paraspinals Modalities: mA estim at 10 pps to the R lumbar paraspinals Trigger Point Dry-Needling  Treatment instructions: Expect mild to moderate muscle soreness. S/S of pneumothorax if dry needled over a lung field, and to seek immediate medical attention should they occur. Patient verbalized understanding of these instructions and education.  Patient Consent Given: Yes Education handout provided: Previously  provided Muscles treated: R thoracolumbar paraspinals Electrical stimulation performed:Yes- see above Treatment response/outcome: twitch response and relief of tension.   [facility] Adult PT Treatment:                                                DATE: 7/924 Therapeutic Exercise: Supine chin tucks x10 3" Supine neck lift offs x10 5" Thoracic ext c soft roller in sitting Door way pectoral stretch 90d x3 20" Standing open books x10 Standing bilat D2 arm lifts at wall 2x10 Manual Therapy: STM to the upper traps and crvical paraspinals Manual cervical traction Skilled palpation to ID TrPs and taut muscle bands  Trigger Point Dry-Needling  Treatment instructions: Expect mild to moderate muscle soreness. S/S of pneumothorax if dry needled over a lung field, and to seek immediate medical attention should they occur. Patient verbalized understanding of these instructions and education.  Patient Consent Given: Yes Education handout provided: Previously provided Muscles treated: R upper trap, and lower cervical paraspinals Electrical stimulation performed: NO Treatment response/outcome: twitch response and relief of tension.   to seek immediate medical attention should they occur. Patient verbalized understanding of these instructions and education.  Patient Consent Given: Yes Education handout provided: Previously provided Muscles treated: R thoracolumbar paraspinals, R upper trap,  Electrical stimulation performed: NO Treatment response/outcome: twitch response and relief of tension.    PATIENT EDUCATION:  Education details: see treatment Person educated: Patient Education method: Explanation, Demonstration, Tactile cues, Verbal cues, and Handouts Education comprehension: verbalized understanding, returned demonstration, verbal cues required, tactile cues required, and needs further education  HOME EXERCISE PROGRAM: Access Code: [email or web address] URL: [email or web address] Date:  04/21/2023 Prepared by: Joellyn Rued  Exercises - Supine Double Knee to Chest  - 2 x daily - 7 x weekly - 1 sets - 10 reps - 5 sec  hold - Hooklying Clamshell with Resistance  - 1 x daily - 7 x weekly - 2 sets - 10 reps - Supine Chin Tuck  - 2 x daily - 7 x weekly - 2 sets - 10 reps - Supine DNF Liftoffs  - 1 x daily - 7 x weekly - 3 sets - 10 reps - Prone Press Up On Elbows  - 1 x daily - 7 x weekly - 2-4 sets - 10-20 reps - Prone Off Center Lumbar Extension Press Up  - 1 x daily - 7 x weekly - 2-4 sets - 10-20  reps - Prone Alternating Arm and Leg Lifts  - 1 x daily - 7 x weekly - 2 sets - 10 reps - Shoulder Y's  - 1 x daily - 7 x weekly - 2 - 3 sets - 10 reps - Shoulder I's  - 1 x daily - 7 x weekly - 2 sets - 10 reps - Shoulder T's  - 1 x daily - 7 x weekly - 2-3 sets - 10 reps - Doorway Pec Stretch at 90 Degrees Abduction  - 1 x daily - 7 x weekly - 1 sets - 3 reps - 2 hold - Shoulder PNF D2 Flexion  - 1 x daily - 7 x weekly - 1 sets - 10 reps - 3 hold - Standing Thoracic Open Book at Wall  - 1 x daily - 7 x weekly - 3 sets - 10 reps - 3 hold  ASSESSMENT:  CLINICAL IMPRESSION: Lifting goals are deferred due pt's recent L hand surgery. PT was completed for cervical postural and posterior chain strengthening, as well as, therex for lumbopelvic strengthening. Re-assessed pt's FOTO scores for both his neck and low back and both have met the predicted value and are onsistent with pt's subjective report of improvement. Pt tolerated prescribed therex without adverse effects. Pt will continue to benefit from skilled PT to address impairments for improved function with less pain.   Evaluation: Patient is a 62 y.o. male well known to our clinic who was seen today for physical therapy evaluation and treatment for chronic neck and back pain that has been ongoing for years. Upon assessment he is noted to have slightly limited cervical AROM reporting pain with all ranges and limited and painful lumbar  extension AROM, bilateral hip weakness, Rt knee and ankle weakness, postural abnormalities, and aberrant lifting mechanics. He will benefit from skilled PT to address the above stated deficits in order to optimize his function and assist in overall pain reduction.   OBJECTIVE IMPAIRMENTS: decreased activity tolerance, decreased endurance, difficulty walking, decreased ROM, decreased strength, increased fascial restrictions, impaired UE functional use, improper body mechanics, postural dysfunction, and pain.   ACTIVITY LIMITATIONS: carrying, lifting, bending, sitting, standing, squatting, and locomotion level  PARTICIPATION LIMITATIONS: meal prep, cleaning, laundry, driving, shopping, community activity, and yard work  PERSONAL FACTORS: Age, Fitness, Profession, Time since onset of injury/illness/exacerbation, and 3+ comorbidities: see PMH above  are also affecting patient's functional outcome.   REHAB POTENTIAL: Fair chronicity of injury, co-morbidities, previous PT  CLINICAL DECISION MAKING: Evolving/moderate complexity  EVALUATION COMPLEXITY: Moderate   GOALS: Goals reviewed with patient? Yes  SHORT TERM GOALS: Target date: 03/04/2023   Patient will be independent and compliant with initial HEP.  Baseline: see above Goal status: Ind  2.  Patient will demonstrate at least 60 degrees of left cervical rotation AROM to improve ability to complete head turns while driving.  Baseline: see above  Status: L rotation 55d Goal status: Improved, but not met  3.  Patient will demonstrate proper hip hinge to reduce stress on back with lifting activity.  Baseline: see above  Goal status: No met  LONG TERM GOALS: Target date: 06/26/2023  Patient will score at least 61% neck and 51% lumbar on FOTO to signify clinically meaningful improvement in functional abilities.   Baseline: see above 05/14/23: Neck 61%, lumbar 54% Goal status: MET  2.  Patient will be independent with advanced home  program to assist in management of his chronic conditions.  Baseline: see above Goal status:  Ongoing  3.  Patient will demonstrate 5/5 hip strength to improve stability about the chain with prolonged walking/standing activity.  Baseline: see above Goal status: Ongoing  4.  Patient will be able to lift at least 20 lbs with minimal pain.  Baseline: see above Goal status: On hold  5. Patient will demonstrate 5/5 bilateral middle trap strength to improve postural stability.   Baseline:see above  Goal status: Ongoing  PLAN:  PT FREQUENCY: 2x/week  PT DURATION: 4 weeks  PLANNED INTERVENTIONS: Therapeutic exercises, Therapeutic activity, Neuromuscular re-education, Balance training, Gait training, Patient/Family education, Self Care, Aquatic Therapy, Dry Needling, Electrical stimulation, Cryotherapy, Moist heat, Traction, Manual therapy, and Re-evaluation.  PLAN FOR NEXT SESSION: review and progress HEP prn; cervical and lumbar mobility, postural and core strengthening.   Miklos Bidinger MS, PT 05/14/23 8:08 PM

## 2023-05-15 DIAGNOSIS — I739 Peripheral vascular disease, unspecified: Secondary | ICD-10-CM | POA: Diagnosis not present

## 2023-05-15 DIAGNOSIS — I10 Essential (primary) hypertension: Secondary | ICD-10-CM | POA: Diagnosis not present

## 2023-05-15 DIAGNOSIS — E1151 Type 2 diabetes mellitus with diabetic peripheral angiopathy without gangrene: Secondary | ICD-10-CM | POA: Diagnosis not present

## 2023-05-15 DIAGNOSIS — M542 Cervicalgia: Secondary | ICD-10-CM | POA: Diagnosis not present

## 2023-05-17 NOTE — Therapy (Signed)
OUTPATIENT PHYSICAL THERAPY TREATMENT/Re-Cert   Patient Name: Daniel CALENDER, MD MRN: [id number] DOB:[birth year],62 y.o., male Today's Date: 05/17/2023   END OF SESSION:         Past Medical History:  Diagnosis Date   Anxiety    Aortic atherosclerosis (HCC)    BPH (benign prostatic hyperplasia)    Chronic pain syndrome    neck and low back   DDD (degenerative disc disease), cervical    Diverticulosis    DJD (degenerative joint disease)    Ganglion cyst    12 mm , right posterior knee   Hyperlipidemia    Internal hemorrhoids    Migraines    OA (osteoarthritis)    knees, hands, right shoulder   Positive QuantiFERON-TB Gold test    Shoulder pain    Tear of right rotator cuff    Type 2 diabetes mellitus (HCC)    last A1c 5.4 on 04-13-2018 in epic (followed by pcp)   Wears glasses    Past Surgical History:  Procedure Laterality Date   ANAL FISSURE REPAIR     ARTHOSCOPIC ROTAOR CUFF REPAIR Right 06/15/2018   Procedure: RIGHT SHOULDER ARTHROSCOPY, DEBRIDEMENT, SUBACROMIAL DECOMPRESSION, DISTAL CLAVICLE RESECTION, ROTATOR CUFF REPAIR;  Surgeon: Eugenia Mcalpine, MD;  Location: [facility];  Service: Orthopedics;  Laterality: Right;   COLONOSCOPY  05-26-2017   dr Dineen Kid SIGMOIDOSCOPY N/A 11/05/2021   Procedure: FLEXIBLE SIGMOIDOSCOPY;  Surgeon: Rachael Fee, MD;  Location: [facility];  Service: Endoscopy;  Laterality: N/A;   Hip Resurface  2006   KNEE ARTHROSCOPY W/ MENISCAL REPAIR Bilateral right 1993; left 2010   MASS EXCISION Right 04/21/2018   Procedure: EXCISION RIGHT THIGH SOFT TISSUE MASS;  Surgeon: Ollen Gross, MD;  Location: [facility];  Service: Orthopedics;  Laterality: Right;   SEPTOPLASTY     SHOULDER ARTHROSCOPY WITH ROTATOR CUFF REPAIR Left    SLAP Tear Repair  2008   TONSILLECTOMY     TOTAL KNEE ARTHROPLASTY Left 12/08/2016   Procedure: LEFT TOTAL KNEE ARTHROPLASTY;  Surgeon: Ollen Gross, MD;  Location: [facility];  Service:  Orthopedics;  Laterality: Left;   TOTAL KNEE ARTHROPLASTY WITH REVISION COMPONENTS Left 04/21/2018   Procedure: Left knee polyethylene exchange ;  Surgeon: Ollen Gross, MD;  Location: [facility];  Service: Orthopedics;  Laterality: Left;   Patient Active Problem List   Diagnosis Date Noted   Rectal bleed 11/10/2021   BRBPR (bright red blood per rectum) 11/09/2021   Controlled type 2 diabetes mellitus without complication, without long-term current use of insulin (HCC) 11/09/2021   Hyperlipidemia 11/09/2021   Chronic musculoskeletal pain 11/09/2021   Oropharyngeal candidiasis 11/09/2021   Hoarse voice quality 11/09/2021   Near syncope 11/09/2021   Acute lower GI bleeding 11/08/2021   Right shoulder injury 06/15/2018   S/P arthroscopy of right shoulder 06/15/2018   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  ONSET DATE: chronic   SUBJECTIVE:  SUBJECTIVE STATEMENT: Pt reports he has been doing well. The HEP have been helpful. Overall, my R neck/shoulder pain is better, approx 50%.   PERTINENT HISTORY:  Anxiety Chronic pain syndrome Degenerative disc disease Migraines Diabetes  Morbid obesity History of  pulmonary embolism Recurrent DVTS, chronic anticoagulation     PAIN:  Are you having pain? Yes: NPRS scale: 5/10 Pain location: low back Pain description: muscle spasm Aggravating factors: sitting upright, standing, walking Relieving factors: chripractor, massage  Are you having pain? Yes: NPRS scale: 6/10 Pain location: neck Pain description: tight band Aggravating factors: working at the computer Relieving factors: ice, medication   PRECAUTIONS: None  WEIGHT BEARING RESTRICTIONS: No  FALLS:  Has patient fallen in last 6 months? No  LIVING ENVIRONMENT: Lives with: lives with their family Lives in: House/apartment Stairs: Yes: Internal: 14 steps; on left going up Has following equipment at home: None  OCCUPATION: MD  PLOF: Independent  PATIENT GOALS: "I want to be back to where I was before I was in the [employer]."    OBJECTIVE:   DIAGNOSTIC FINDINGS:  Lumbar MRI: IMPRESSION: 1. No acute osseous abnormality in the lumbar spine. 2. Intermittent lower thoracic and lumbar chronic disc and endplate degeneration. Mild lumbar facet hypertrophy. No lumbar spinal stenosis or convincing lumbar neural impingement. Mild to moderate bilateral T10 and right T11 neural foraminal stenosis.  Cervical MRI: IMPRESSION: 1. No acute osseous abnormality. Multilevel cervical spine degeneration but capacious spinal canal. No spinal stenosis. 2. Generally mild cervical neural foraminal stenosis due to facet and/or disc osteophyte degeneration. Up to moderate left C8 nerve level foraminal stenosis.  PATIENT SURVEYS:  FOTO Neck 57% function to 61% predicted;  lumbar 46% function to 51% predicted   SCREENING FOR RED FLAGS: Bowel or bladder incontinence: No Spinal tumors: No Cauda equina syndrome: No Compression fracture: No Abdominal aneurysm: No  COGNITION: Overall cognitive status: Within functional limits for tasks assessed     SENSATION: WFL  MUSCLE  LENGTH: Hamstrings: moderate tightness bilaterally   POSTURE: rounded shoulders, forward head, and decreased lumbar lordosis  PALPATION: TTP cervical paraspinals, Rt upper trap, lumbar paraspinals   LUMBAR ROM:   AROM eval  Flexion WNL  Extension 50% limited   Right lateral flexion WNL  Left lateral flexion WNL  Right rotation WNL  Left rotation WNL   (Blank rows = not tested) pain with all lumbar AROM, most noted with extension   LOWER EXTREMITY ROM:     Active  Right eval Left eval  Hip flexion    Hip extension    Hip abduction    Hip adduction    Hip internal rotation    Hip external rotation    Knee flexion    Knee extension    Ankle dorsiflexion    Ankle plantarflexion    Ankle inversion    Ankle eversion     (Blank rows = not tested)  LOWER EXTREMITY MMT:    MMT Right eval Left eval  Hip flexion 4- 5  Hip extension 4 4  Hip abduction 4 4  Hip adduction    Hip internal rotation    Hip external rotation    Knee flexion 4+   Knee extension 5 5  Ankle dorsiflexion 4+ 5  Ankle plantarflexion 5 in sitting 5  Ankle inversion    Ankle eversion 4+ 5   (Blank rows = not tested) CERVICAL ROM:   Active ROM A/PROM (deg) eval AROM 03/05/23 AROM 7/224 AROM 04/21/23  Flexion  40     Extension 50     Right lateral flexion 20     Left lateral flexion 30     Right rotation 62   65  Left rotation 50  55 60   (Blank rows = not tested) pain with all cervical AROM UPPER EXTREMITY MMT:  MMT Right eval Left eval  Shoulder flexion 4 5  Shoulder extension    Shoulder abduction 5 5  Shoulder adduction    Shoulder extension    Shoulder internal rotation    Shoulder external rotation    Middle trapezius 4 4  Lower trapezius    Elbow flexion    Elbow extension    Wrist flexion    Wrist extension    Wrist ulnar deviation    Wrist radial deviation    Wrist pronation    Wrist supination    Grip strength     (Blank rows = not tested)  SPECIAL TESTS:  (+)  Cervical compression/distraction  (+) SLR  FUNCTIONAL TESTS:  Squat: WBOS, poor hinge; increased pain    GAIT: Distance walked: 10 ft  Assistive device utilized: None Level of assistance: Complete Independence Comments: WNL  [facility] Adult PT Treatment:                                                DATE: 05/19/23 Therapeutic Exercise: *** Manual Therapy: *** Neuromuscular re-ed: *** Therapeutic Activity: *** Modalities: *** Self Care: ***  [facility] Adult PT Treatment:                                                DATE: 05/14/23 Therapeutic Exercise: Nustep 5 mins L7 UE/LE Standing chin tucks x10 3" Standing shoulder hor abd star pattern 2x5 GTB Standing shoulder ER star pattern 2x5 GTB Cybex leg press 2x10 140# Cybex Hip ext 2x10 each 50# Therapeutic Activity: FOTO- re-assessed and reviewed  [facility] Adult PT Treatment:                                                DATE: 05/12/23 Therapeutic Exercise: Standing chin tucks x10 3" Standing shoulder hor abd star pattern 2x5 GTB Standing shoulder ER star pattern 2x5 GTB Cybex leg press 2x10 Manual Therapy: STM/MTPR to the bilat upper traps and cervical paraspinals Skilled palpation to ID TrPs and taut muscle bands of the R lumbar paraspinals Modalities: mA estim at 10 pps to the R lumbar paraspinals Trigger Point Dry-Needling  Treatment instructions: Expect mild to moderate muscle soreness. S/S of pneumothorax if dry needled over a lung field, and to seek immediate medical attention should they occur. Patient verbalized understanding of these instructions and education.  Patient Consent Given: Yes Education handout provided: Previously provided Muscles treated: R thoracolumbar paraspinals Electrical stimulation performed:Yes- see above Treatment response/outcome: twitch response and relief of tension.     Patient Consent Given: Yes Education handout provided: Previously provided Muscles treated: R thoracolumbar paraspinals,  R upper trap,  Electrical stimulation performed: NO Treatment response/outcome: twitch response and relief of tension.    PATIENT EDUCATION:  Education details: see treatment  Person educated: Patient Education method: Explanation, Demonstration, Tactile cues, Verbal cues, and Handouts Education comprehension: verbalized understanding, returned demonstration, verbal cues required, tactile cues required, and needs further education  HOME EXERCISE PROGRAM: Access Code: [email or web address] URL: [email or web address] Date: 04/21/2023 Prepared by: Joellyn Rued  Exercises - Supine Double Knee to Chest  - 2 x daily - 7 x weekly - 1 sets - 10 reps - 5 sec  hold - Hooklying Clamshell with Resistance  - 1 x daily - 7 x weekly - 2 sets - 10 reps - Supine Chin Tuck  - 2 x daily - 7 x weekly - 2 sets - 10 reps - Supine DNF Liftoffs  - 1 x daily - 7 x weekly - 3 sets - 10 reps - Prone Press Up On Elbows  - 1 x daily - 7 x weekly - 2-4 sets - 10-20 reps - Prone Off Center Lumbar Extension Press Up  - 1 x daily - 7 x weekly - 2-4 sets - 10-20 reps - Prone Alternating Arm and Leg Lifts  - 1 x daily - 7 x weekly - 2 sets - 10 reps - Shoulder Y's  - 1 x daily - 7 x weekly - 2 - 3 sets - 10 reps - Shoulder I's  - 1 x daily - 7 x weekly - 2 sets - 10 reps - Shoulder T's  - 1 x daily - 7 x weekly - 2-3 sets - 10 reps - Doorway Pec Stretch at 90 Degrees Abduction  - 1 x daily - 7 x weekly - 1 sets - 3 reps - 2 hold - Shoulder PNF D2 Flexion  - 1 x daily - 7 x weekly - 1 sets - 10 reps - 3 hold - Standing Thoracic Open Book at Wall  - 1 x daily - 7 x weekly - 3 sets - 10 reps - 3 hold  ASSESSMENT:  CLINICAL IMPRESSION: Lifting goals are deferred due pt's recent L hand surgery. PT was completed for cervical postural and posterior chain strengthening, as well as, therex for lumbopelvic strengthening. Re-assessed pt's FOTO scores for both his neck and low back and both have met the predicted value and are  onsistent with pt's subjective report of improvement. Pt tolerated prescribed therex without adverse effects. Pt will continue to benefit from skilled PT to address impairments for improved function with less pain.   Evaluation: Patient is a 62 y.o. male well known to our clinic who was seen today for physical therapy evaluation and treatment for chronic neck and back pain that has been ongoing for years. Upon assessment he is noted to have slightly limited cervical AROM reporting pain with all ranges and limited and painful lumbar extension AROM, bilateral hip weakness, Rt knee and ankle weakness, postural abnormalities, and aberrant lifting mechanics. He will benefit from skilled PT to address the above stated deficits in order to optimize his function and assist in overall pain reduction.   OBJECTIVE IMPAIRMENTS: decreased activity tolerance, decreased endurance, difficulty walking, decreased ROM, decreased strength, increased fascial restrictions, impaired UE functional use, improper body mechanics, postural dysfunction, and pain.   ACTIVITY LIMITATIONS: carrying, lifting, bending, sitting, standing, squatting, and locomotion level  PARTICIPATION LIMITATIONS: meal prep, cleaning, laundry, driving, shopping, community activity, and yard work  PERSONAL FACTORS: Age, Fitness, Profession, Time since onset of injury/illness/exacerbation, and 3+ comorbidities: see PMH above  are also affecting patient's functional outcome.   REHAB POTENTIAL: Fair chronicity of injury, co-morbidities, previous PT  CLINICAL  DECISION MAKING: Evolving/moderate complexity  EVALUATION COMPLEXITY: Moderate   GOALS: Goals reviewed with patient? Yes  SHORT TERM GOALS: Target date: 03/04/2023   Patient will be independent and compliant with initial HEP.  Baseline: see above Goal status: Ind  2.  Patient will demonstrate at least 60 degrees of left cervical rotation AROM to improve ability to complete head turns  while driving.  Baseline: see above  Status: L rotation 55d Goal status: Improved, but not met  3.  Patient will demonstrate proper hip hinge to reduce stress on back with lifting activity.  Baseline: see above  Goal status: No met  LONG TERM GOALS: Target date: 06/26/2023  Patient will score at least 61% neck and 51% lumbar on FOTO to signify clinically meaningful improvement in functional abilities.   Baseline: see above 05/14/23: Neck 61%, lumbar 54% Goal status: MET  2.  Patient will be independent with advanced home program to assist in management of his chronic conditions.  Baseline: see above Goal status: Ongoing  3.  Patient will demonstrate 5/5 hip strength to improve stability about the chain with prolonged walking/standing activity.  Baseline: see above Goal status: Ongoing  4.  Patient will be able to lift at least 20 lbs with minimal pain.  Baseline: see above Goal status: On hold  5. Patient will demonstrate 5/5 bilateral middle trap strength to improve postural stability.   Baseline:see above  Goal status: Ongoing  PLAN:  PT FREQUENCY: 2x/week  PT DURATION: 4 weeks  PLANNED INTERVENTIONS: Therapeutic exercises, Therapeutic activity, Neuromuscular re-education, Balance training, Gait training, Patient/Family education, Self Care, Aquatic Therapy, Dry Needling, Electrical stimulation, Cryotherapy, Moist heat, Traction, Manual therapy, and Re-evaluation.  PLAN FOR NEXT SESSION: review and progress HEP prn; cervical and lumbar mobility, postural and core strengthening.     MS, PT 05/17/23 11:11 PM

## 2023-05-19 ENCOUNTER — Ambulatory Visit: Payer: 59

## 2023-05-19 DIAGNOSIS — M5441 Lumbago with sciatica, right side: Secondary | ICD-10-CM | POA: Diagnosis not present

## 2023-05-19 DIAGNOSIS — R293 Abnormal posture: Secondary | ICD-10-CM | POA: Diagnosis not present

## 2023-05-19 DIAGNOSIS — M542 Cervicalgia: Secondary | ICD-10-CM

## 2023-05-19 DIAGNOSIS — M6281 Muscle weakness (generalized): Secondary | ICD-10-CM | POA: Diagnosis not present

## 2023-05-19 DIAGNOSIS — G8929 Other chronic pain: Secondary | ICD-10-CM

## 2023-05-19 DIAGNOSIS — M25511 Pain in right shoulder: Secondary | ICD-10-CM | POA: Diagnosis not present

## 2023-05-20 ENCOUNTER — Other Ambulatory Visit (HOSPITAL_COMMUNITY): Payer: Self-pay

## 2023-05-21 ENCOUNTER — Ambulatory Visit: Payer: 59

## 2023-05-27 DIAGNOSIS — M9903 Segmental and somatic dysfunction of lumbar region: Secondary | ICD-10-CM | POA: Diagnosis not present

## 2023-05-27 DIAGNOSIS — M9902 Segmental and somatic dysfunction of thoracic region: Secondary | ICD-10-CM | POA: Diagnosis not present

## 2023-05-27 DIAGNOSIS — M9901 Segmental and somatic dysfunction of cervical region: Secondary | ICD-10-CM | POA: Diagnosis not present

## 2023-05-27 DIAGNOSIS — M9904 Segmental and somatic dysfunction of sacral region: Secondary | ICD-10-CM | POA: Diagnosis not present

## 2023-05-27 DIAGNOSIS — M25642 Stiffness of left hand, not elsewhere classified: Secondary | ICD-10-CM | POA: Diagnosis not present

## 2023-06-01 NOTE — Therapy (Signed)
OUTPATIENT PHYSICAL THERAPY TREATMENT   Patient Name: Daniel STEFFEK, MD MRN: [id number] DOB:Sep 26, 1961,62 y.o., male 65 Date: 06/04/2023   END OF SESSION:  PT End of Session - 06/03/23 1405     Visit Number 10    Number of Visits 17    Date for PT Re-Evaluation 06/26/23    Authorization Type [employer]    PT Start Time 1340    PT Stop Time 1415    PT Time Calculation (min) 35 min    Activity Tolerance Patient tolerated treatment well    Behavior During Therapy WFL for tasks assessed/performed                    Past Medical History:  Diagnosis Date   Anxiety    Aortic atherosclerosis (HCC)    BPH (benign prostatic hyperplasia)    Chronic pain syndrome    neck and low back   DDD (degenerative disc disease), cervical    Diverticulosis    DJD (degenerative joint disease)    Ganglion cyst    12 mm , right posterior knee   Hyperlipidemia    Internal hemorrhoids    Migraines    OA (osteoarthritis)    knees, hands, right shoulder   Positive QuantiFERON-TB Gold test    Shoulder pain    Tear of right rotator cuff    Type 2 diabetes mellitus (HCC)    last A1c 5.4 on 04-13-2018 in epic (followed by pcp)   Wears glasses    Past Surgical History:  Procedure Laterality Date   ANAL FISSURE REPAIR     ARTHOSCOPIC ROTAOR CUFF REPAIR Right 06/15/2018   Procedure: RIGHT SHOULDER ARTHROSCOPY, DEBRIDEMENT, SUBACROMIAL DECOMPRESSION, DISTAL CLAVICLE RESECTION, ROTATOR CUFF REPAIR;  Surgeon: Eugenia Mcalpine, MD;  Location: [facility];  Service: Orthopedics;  Laterality: Right;   COLONOSCOPY  05-26-2017   dr Dineen Kid SIGMOIDOSCOPY N/A 11/05/2021   Procedure: FLEXIBLE SIGMOIDOSCOPY;  Surgeon: Rachael Fee, MD;  Location: [facility];  Service: Endoscopy;  Laterality: N/A;   Hip Resurface  2006   KNEE ARTHROSCOPY W/ MENISCAL REPAIR Bilateral right 1993; left 2010   MASS EXCISION Right 04/21/2018   Procedure: EXCISION RIGHT THIGH SOFT TISSUE MASS;   Surgeon: Ollen Gross, MD;  Location: [facility];  Service: Orthopedics;  Laterality: Right;   SEPTOPLASTY     SHOULDER ARTHROSCOPY WITH ROTATOR CUFF REPAIR Left    SLAP Tear Repair  2008   TONSILLECTOMY     TOTAL KNEE ARTHROPLASTY Left 12/08/2016   Procedure: LEFT TOTAL KNEE ARTHROPLASTY;  Surgeon: Ollen Gross, MD;  Location: [facility];  Service: Orthopedics;  Laterality: Left;   TOTAL KNEE ARTHROPLASTY WITH REVISION COMPONENTS Left 04/21/2018   Procedure: Left knee polyethylene exchange ;  Surgeon: Ollen Gross, MD;  Location: [facility];  Service: Orthopedics;  Laterality: Left;   Patient Active Problem List   Diagnosis Date Noted   Rectal bleed 11/10/2021   BRBPR (bright red blood per rectum) 11/09/2021   Controlled type 2 diabetes mellitus without complication, without long-term current use of insulin (HCC) 11/09/2021   Hyperlipidemia 11/09/2021   Chronic musculoskeletal pain 11/09/2021   Oropharyngeal candidiasis 11/09/2021   Hoarse voice quality 11/09/2021   Near syncope 11/09/2021   Acute lower GI bleeding 11/08/2021   Right shoulder injury 06/15/2018   S/P arthroscopy of right shoulder 06/15/2018   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   OA (osteoarthritis) of knee 12/08/2016   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 12/17/2015   Cervical facet joint syndrome 12/17/2015   Chronic bilateral low back pain without sciatica 12/17/2015   Primary osteoarthritis of left knee 12/17/2015    PCP:   Garlan Fillers, MD    REFERRING PROVIDER: Tressie Stalker, MD   REFERRING DIAG:  M51.36 (ICD-10-CM) - Other intervertebral disc degeneration, lumbar region  M54.2 (ICD-10-CM) - Cervicalgia    Rationale for Evaluation and Treatment: Rehabilitation  THERAPY DIAG:  Cervicalgia  Chronic low back pain with right-sided sciatica, unspecified back pain laterality  Muscle weakness (generalized)  Chronic right shoulder pain  Abnormal  posture  ONSET DATE: chronic   SUBJECTIVE:                                                                                                                                                                                           SUBJECTIVE STATEMENT: Pt reports his R neck and shoulder have been bothering him more recently and is not sure why. Migraines are also associated with the incease pain.    PERTINENT HISTORY:  Anxiety Chronic pain syndrome Degenerative disc disease Migraines Diabetes  Morbid obesity History of pulmonary embolism Recurrent DVTS, chronic anticoagulation     PAIN:  Are you having pain? Yes: NPRS scale: 6/10 Pain location: low back Pain description: muscle spasm Aggravating factors: sitting upright, standing, walking Relieving factors: chripractor, massage  Are you having pain? Yes: NPRS scale: 8/10 Pain location: neck Pain description: tight band Aggravating factors: working at the computer Relieving factors: ice, medication   PRECAUTIONS: None  WEIGHT BEARING RESTRICTIONS: No  FALLS:  Has patient fallen in last 6 months? No  LIVING ENVIRONMENT: Lives with: lives with their family Lives in: House/apartment Stairs: Yes: Internal: 14 steps; on left going up Has following equipment at home: None  OCCUPATION: MD  PLOF: Independent  PATIENT GOALS: "I want to be back to where I was before I was in the [employer]."    OBJECTIVE:   DIAGNOSTIC FINDINGS:  Lumbar MRI: IMPRESSION: 1. No acute osseous abnormality in the lumbar spine. 2. Intermittent lower thoracic and lumbar chronic disc and endplate degeneration. Mild lumbar facet hypertrophy. No lumbar spinal stenosis or convincing lumbar neural impingement. Mild to moderate bilateral T10 and right T11 neural foraminal stenosis.  Cervical MRI: IMPRESSION: 1. No acute osseous abnormality. Multilevel cervical spine degeneration but capacious spinal canal. No spinal stenosis. 2. Generally  mild cervical neural foraminal stenosis due to facet and/or disc osteophyte degeneration. Up to moderate left C8 nerve level foraminal stenosis.  PATIENT SURVEYS:  FOTO Neck 57% function to 61% predicted;  lumbar 46% function to 51% predicted  SCREENING FOR RED FLAGS: Bowel or bladder incontinence: No Spinal tumors: No Cauda equina syndrome: No Compression fracture: No Abdominal aneurysm: No  COGNITION: Overall cognitive status: Within functional limits for tasks assessed     SENSATION: WFL  MUSCLE LENGTH: Hamstrings: moderate tightness bilaterally   POSTURE: rounded shoulders, forward head, and decreased lumbar lordosis  PALPATION: TTP cervical paraspinals, Rt upper trap, lumbar paraspinals   LUMBAR ROM:   AROM eval  Flexion WNL  Extension 50% limited   Right lateral flexion WNL  Left lateral flexion WNL  Right rotation WNL  Left rotation WNL   (Blank rows = not tested) pain with all lumbar AROM, most noted with extension   LOWER EXTREMITY ROM:     Active  Right eval Left eval  Hip flexion    Hip extension    Hip abduction    Hip adduction    Hip internal rotation    Hip external rotation    Knee flexion    Knee extension    Ankle dorsiflexion    Ankle plantarflexion    Ankle inversion    Ankle eversion     (Blank rows = not tested)  LOWER EXTREMITY MMT:    MMT Right eval Left eval  Hip flexion 4- 5  Hip extension 4 4  Hip abduction 4 4  Hip adduction    Hip internal rotation    Hip external rotation    Knee flexion 4+   Knee extension 5 5  Ankle dorsiflexion 4+ 5  Ankle plantarflexion 5 in sitting 5  Ankle inversion    Ankle eversion 4+ 5   (Blank rows = not tested) CERVICAL ROM:   Active ROM A/PROM (deg) eval AROM 03/05/23 AROM 7/224 AROM 04/21/23  Flexion 40     Extension 50     Right lateral flexion 20     Left lateral flexion 30     Right rotation 62   65  Left rotation 50  55 60   (Blank rows = not tested) pain with all  cervical AROM UPPER EXTREMITY MMT:  MMT Right eval Left eval  Shoulder flexion 4 5  Shoulder extension    Shoulder abduction 5 5  Shoulder adduction    Shoulder extension    Shoulder internal rotation    Shoulder external rotation    Middle trapezius 4 4  Lower trapezius    Elbow flexion    Elbow extension    Wrist flexion    Wrist extension    Wrist ulnar deviation    Wrist radial deviation    Wrist pronation    Wrist supination    Grip strength     (Blank rows = not tested)  SPECIAL TESTS:  (+) Cervical compression/distraction  (+) SLR  FUNCTIONAL TESTS:  Squat: WBOS, poor hinge; increased pain    GAIT: Distance walked: 10 ft  Assistive device utilized: None Level of assistance: Complete Independence Comments: WNL  [facility] Adult PT Treatment:                                                DATE: 06/03/23 Manual Therapy: Skilled palpation to identify TrPs and taut muscle bands of R upper traps and cervical paraspinals Kinesiotaping of the R upper trap c 3 I strips for inhibition, 90% stretch Modalities: mA Estim to the R mid to lower cervical  paraspinals and multifidi and upper traps, 6pps, intesity as tolerated, 15 mins  Trigger Point Dry-Needling  Treatment instructions: Expect mild to moderate muscle soreness. S/S of pneumothorax if dry needled over a lung field, and to seek immediate medical attention should they occur. Patient verbalized understanding of these instructions and education.  Patient Consent Given: Yes Education handout provided: Previously provided Muscles treated: R cervical paraspinal and multifidi and upper trap with Estim. R thoracolumbar paraspinals and multifidi-TPDN only Electrical stimulation performed:Yes- see above Treatment response/outcome: twitch response and relief of tension  [facility] Adult PT Treatment:                                                DATE: 05/19/23 Therapeutic Exercise: Prone chin tucks x10 3" Prone H's x10 Prone I's  x10 Prone alt leg lifts x10 Prone alt arm/leg x10 Manual Therapy: STM to the R upper traps, cervical paraspinals, levator, interscap muscle Modalities: mA estim at 6 pps to the R lumbar paraspinals Trigger Point Dry-Needling  Treatment instructions: Expect mild to moderate muscle soreness. S/S of pneumothorax if dry needled over a lung field, and to seek immediate medical attention should they occur. Patient verbalized understanding of these instructions and education.  Patient Consent Given: Yes Education handout provided: Previously provided Muscles treated: R thoracolumbar paraspinals Electrical stimulation performed:Yes- see above Treatment response/outcome: twitch response and relief of tension  [facility] Adult PT Treatment:                                                DATE: 05/14/23 Therapeutic Exercise: Nustep 5 mins L7 UE/LE Standing chin tucks x10 3" Standing shoulder hor abd star pattern 2x5 GTB Standing shoulder ER star pattern 2x5 GTB Cybex leg press 2x10 140# Cybex Hip ext 2x10 each 50# Therapeutic Activity: FOTO- re-assessed and reviewed  [facility] Adult PT Treatment:                                                DATE: 05/12/23 Therapeutic Exercise: Standing chin tucks x10 3" Standing shoulder hor abd star pattern 2x5 GTB Standing shoulder ER star pattern 2x5 GTB Cybex leg press 2x10 Manual Therapy: STM/MTPR to the bilat upper traps and cervical paraspinals Skilled palpation to ID TrPs and taut muscle bands of the R lumbar paraspinals Modalities: mA estim at 10 pps to the R lumbar paraspinals Trigger Point Dry-Needling  Treatment instructions: Expect mild to moderate muscle soreness. S/S of pneumothorax if dry needled over a lung field, and to seek immediate medical attention should they occur. Patient verbalized understanding of these instructions and education.  Patient Consent Given: Yes Education handout provided: Previously provided Muscles treated: R  thoracolumbar paraspinals Electrical stimulation performed:Yes- see above Treatment response/outcome: twitch response and relief of tension   PATIENT EDUCATION:  Education details: see treatment Person educated: Patient Education method: Explanation, Demonstration, Tactile cues, Verbal cues, and Handouts Education comprehension: verbalized understanding, returned demonstration, verbal cues required, tactile cues required, and needs further education  HOME EXERCISE PROGRAM: Access Code: [email or web address] URL: [email or web address] Date: 04/21/2023 Prepared by: Joellyn Rued  Exercises -  Supine Double Knee to Chest  - 2 x daily - 7 x weekly - 1 sets - 10 reps - 5 sec  hold - Hooklying Clamshell with Resistance  - 1 x daily - 7 x weekly - 2 sets - 10 reps - Supine Chin Tuck  - 2 x daily - 7 x weekly - 2 sets - 10 reps - Supine DNF Liftoffs  - 1 x daily - 7 x weekly - 3 sets - 10 reps - Prone Press Up On Elbows  - 1 x daily - 7 x weekly - 2-4 sets - 10-20 reps - Prone Off Center Lumbar Extension Press Up  - 1 x daily - 7 x weekly - 2-4 sets - 10-20 reps - Prone Alternating Arm and Leg Lifts  - 1 x daily - 7 x weekly - 2 sets - 10 reps - Shoulder Y's  - 1 x daily - 7 x weekly - 2 - 3 sets - 10 reps - Shoulder I's  - 1 x daily - 7 x weekly - 2 sets - 10 reps - Shoulder T's  - 1 x daily - 7 x weekly - 2-3 sets - 10 reps - Doorway Pec Stretch at 90 Degrees Abduction  - 1 x daily - 7 x weekly - 1 sets - 3 reps - 2 hold - Shoulder PNF D2 Flexion  - 1 x daily - 7 x weekly - 1 sets - 10 reps - 3 hold - Standing Thoracic Open Book at Wall  - 1 x daily - 7 x weekly - 3 sets - 10 reps - 3 hold  ASSESSMENT:  CLINICAL IMPRESSION: PT treatment session was shortened due to pt's arriving late for appt. Pt continues to be experiencing a flare up of his R neck and upper shoulder c associated migraine HAs. PT was provided for pain relief with TPDN as noted above c mA Estim also completed to the R  cervical and upper trap muscles. Additionally, kinesiotaping for inhibition of the R upper trap was completed. Pt reported the R neck and upper shoulder felt better following the session. Pt tolerated PT today without adverse effects. Will assess pt's response to today's session the next PT visit.   Evaluation: Patient is a 62 y.o. male well known to our clinic who was seen today for physical therapy evaluation and treatment for chronic neck and back pain that has been ongoing for years. Upon assessment he is noted to have slightly limited cervical AROM reporting pain with all ranges and limited and painful lumbar extension AROM, bilateral hip weakness, Rt knee and ankle weakness, postural abnormalities, and aberrant lifting mechanics. He will benefit from skilled PT to address the above stated deficits in order to optimize his function and assist in overall pain reduction.   OBJECTIVE IMPAIRMENTS: decreased activity tolerance, decreased endurance, difficulty walking, decreased ROM, decreased strength, increased fascial restrictions, impaired UE functional use, improper body mechanics, postural dysfunction, and pain.   ACTIVITY LIMITATIONS: carrying, lifting, bending, sitting, standing, squatting, and locomotion level  PARTICIPATION LIMITATIONS: meal prep, cleaning, laundry, driving, shopping, community activity, and yard work  PERSONAL FACTORS: Age, Fitness, Profession, Time since onset of injury/illness/exacerbation, and 3+ comorbidities: see PMH above  are also affecting patient's functional outcome.   REHAB POTENTIAL: Fair chronicity of injury, co-morbidities, previous PT  CLINICAL DECISION MAKING: Evolving/moderate complexity  EVALUATION COMPLEXITY: Moderate   GOALS: Goals reviewed with patient? Yes  SHORT TERM GOALS: Target date: 03/04/2023   Patient will be  independent and compliant with initial HEP.  Baseline: see above Goal status: Ind  2.  Patient will demonstrate at least 60  degrees of left cervical rotation AROM to improve ability to complete head turns while driving.  Baseline: see above  Status: L rotation 55d Goal status: Improved, but not met  3.  Patient will demonstrate proper hip hinge to reduce stress on back with lifting activity.  Baseline: see above  Goal status: No met  LONG TERM GOALS: Target date: 06/26/2023  Patient will score at least 61% neck and 51% lumbar on FOTO to signify clinically meaningful improvement in functional abilities.   Baseline: see above 05/14/23: Neck 61%, lumbar 54% Goal status: MET  2.  Patient will be independent with advanced home program to assist in management of his chronic conditions.  Baseline: see above Goal status: Ongoing  3.  Patient will demonstrate 5/5 hip strength to improve stability about the chain with prolonged walking/standing activity.  Baseline: see above Goal status: Ongoing  4.  Patient will be able to lift at least 20 lbs with minimal pain.  Baseline: see above Goal status: On hold  5. Patient will demonstrate 5/5 bilateral middle trap strength to improve postural stability.   Baseline:see above  Goal status: Ongoing  PLAN:  PT FREQUENCY: 2x/week  PT DURATION: 4 weeks  PLANNED INTERVENTIONS: Therapeutic exercises, Therapeutic activity, Neuromuscular re-education, Balance training, Gait training, Patient/Family education, Self Care, Aquatic Therapy, Dry Needling, Electrical stimulation, Cryotherapy, Moist heat, Traction, Manual therapy, and Re-evaluation.  PLAN FOR NEXT SESSION: review and progress HEP prn; cervical and lumbar mobility, postural and core strengthening.   Gagan Dillion MS, PT 06/04/23 5:52 AM

## 2023-06-03 ENCOUNTER — Ambulatory Visit: Payer: 59

## 2023-06-03 DIAGNOSIS — M542 Cervicalgia: Secondary | ICD-10-CM | POA: Diagnosis not present

## 2023-06-03 DIAGNOSIS — G8929 Other chronic pain: Secondary | ICD-10-CM | POA: Diagnosis not present

## 2023-06-03 DIAGNOSIS — M25511 Pain in right shoulder: Secondary | ICD-10-CM | POA: Diagnosis not present

## 2023-06-03 DIAGNOSIS — R293 Abnormal posture: Secondary | ICD-10-CM | POA: Diagnosis not present

## 2023-06-03 DIAGNOSIS — M5441 Lumbago with sciatica, right side: Secondary | ICD-10-CM | POA: Diagnosis not present

## 2023-06-03 DIAGNOSIS — M6281 Muscle weakness (generalized): Secondary | ICD-10-CM | POA: Diagnosis not present

## 2023-06-04 DIAGNOSIS — M25642 Stiffness of left hand, not elsewhere classified: Secondary | ICD-10-CM | POA: Diagnosis not present

## 2023-06-08 ENCOUNTER — Other Ambulatory Visit (HOSPITAL_COMMUNITY): Payer: Self-pay

## 2023-06-08 NOTE — Therapy (Addendum)
 OUTPATIENT PHYSICAL THERAPY TREATMENT/DC   Patient Name: Daniel HAZEN, MD MRN: [id number] DOB:January 17, 1961,62 y.o., male 73 Date: 06/09/2023   END OF SESSION:  PT End of Session - 06/09/23 1332     Visit Number 11    Number of Visits 17    Date for PT Re-Evaluation 06/26/23    Authorization Type [employer]    PT Start Time 1330    PT Stop Time 1415    PT Time Calculation (min) 45 min    Activity Tolerance Patient tolerated treatment well    Behavior During Therapy WFL for tasks assessed/performed                     Past Medical History:  Diagnosis Date   Anxiety    Aortic atherosclerosis (HCC)    BPH (benign prostatic hyperplasia)    Chronic pain syndrome    neck and low back   DDD (degenerative disc disease), cervical    Diverticulosis    DJD (degenerative joint disease)    Ganglion cyst    12 mm , right posterior knee   Hyperlipidemia    Internal hemorrhoids    Migraines    OA (osteoarthritis)    knees, hands, right shoulder   Positive QuantiFERON-TB Gold test    Shoulder pain    Tear of right rotator cuff    Type 2 diabetes mellitus (HCC)    last A1c 5.4 on 04-13-2018 in epic (followed by pcp)   Wears glasses    Past Surgical History:  Procedure Laterality Date   ANAL FISSURE REPAIR     ARTHOSCOPIC ROTAOR CUFF REPAIR Right 06/15/2018   Procedure: RIGHT SHOULDER ARTHROSCOPY, DEBRIDEMENT, SUBACROMIAL DECOMPRESSION, DISTAL CLAVICLE RESECTION, ROTATOR CUFF REPAIR;  Surgeon: Gerome Charleston, MD;  Location: [facility];  Service: Orthopedics;  Laterality: Right;   COLONOSCOPY  05-26-2017   dr abran SIDE SIGMOIDOSCOPY N/A 11/05/2021   Procedure: FLEXIBLE SIGMOIDOSCOPY;  Surgeon: Teressa Toribio SQUIBB, MD;  Location: [facility];  Service: Endoscopy;  Laterality: N/A;   Hip Resurface  2006   KNEE ARTHROSCOPY W/ MENISCAL REPAIR Bilateral right 1993; left 2010   MASS EXCISION Right 04/21/2018   Procedure: EXCISION RIGHT THIGH SOFT TISSUE  MASS;  Surgeon: Melodi Lerner, MD;  Location: [facility];  Service: Orthopedics;  Laterality: Right;   SEPTOPLASTY     SHOULDER ARTHROSCOPY WITH ROTATOR CUFF REPAIR Left    SLAP Tear Repair  2008   TONSILLECTOMY     TOTAL KNEE ARTHROPLASTY Left 12/08/2016   Procedure: LEFT TOTAL KNEE ARTHROPLASTY;  Surgeon: Lerner Melodi, MD;  Location: [facility];  Service: Orthopedics;  Laterality: Left;   TOTAL KNEE ARTHROPLASTY WITH REVISION COMPONENTS Left 04/21/2018   Procedure: Left knee polyethylene exchange ;  Surgeon: Melodi Lerner, MD;  Location: [facility];  Service: Orthopedics;  Laterality: Left;   Patient Active Problem List   Diagnosis Date Noted   Rectal bleed 11/10/2021   BRBPR (bright red blood per rectum) 11/09/2021   Controlled type 2 diabetes mellitus without complication, without long-term current use of insulin  (HCC) 11/09/2021   Hyperlipidemia 11/09/2021   Chronic musculoskeletal pain 11/09/2021   Oropharyngeal candidiasis 11/09/2021   Hoarse voice quality 11/09/2021   Near syncope 11/09/2021   Acute lower GI bleeding 11/08/2021   Right shoulder injury 06/15/2018   S/P arthroscopy of right shoulder 06/15/2018   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   OA (osteoarthritis) of knee 12/08/2016   Intractable chronic migraine 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 12/17/2015   Cervical facet joint syndrome 12/17/2015   Chronic bilateral low back pain without sciatica 12/17/2015   Primary osteoarthritis of left knee 12/17/2015    PCP:   Yolande Toribio MATSU, MD    REFERRING PROVIDER: Mavis Purchase, MD   REFERRING DIAG:  M51.36 (ICD-10-CM) - Other intervertebral disc degeneration, lumbar region  M54.2 (ICD-10-CM) - Cervicalgia    Rationale for Evaluation and Treatment: Rehabilitation  THERAPY DIAG:  Cervicalgia  Chronic low back pain with right-sided sciatica, unspecified back pain laterality  Muscle weakness (generalized)  Chronic right shoulder pain  ONSET  DATE: chronic   SUBJECTIVE:                                                                                                                                                                                           SUBJECTIVE STATEMENT: Pt reports he did some stretching this AM and both his R neck and low back are feeling better today than last week    PERTINENT HISTORY:  Anxiety Chronic pain syndrome Degenerative disc disease Migraines Diabetes  Morbid obesity History of pulmonary embolism Recurrent DVTS, chronic anticoagulation     PAIN:  Are you having pain? Yes: NPRS scale: 5/10 Pain location: low back Pain description: muscle spasm Aggravating factors: sitting upright, standing, walking Relieving factors: chripractor, massage  Are you having pain? Yes: NPRS scale: 5/10 Pain location: neck Pain description: tight band Aggravating factors: working at the computer Relieving factors: ice, medication   PRECAUTIONS: None  WEIGHT BEARING RESTRICTIONS: No  FALLS:  Has patient fallen in last 6 months? No  LIVING ENVIRONMENT: Lives with: lives with their family Lives in: House/apartment Stairs: Yes: Internal: 14 steps; on left going up Has following equipment at home: None  OCCUPATION: MD  PLOF: Independent  PATIENT GOALS: I want to be back to where I was before I was in the [employer].    OBJECTIVE:   DIAGNOSTIC FINDINGS:  Lumbar MRI: IMPRESSION: 1. No acute osseous abnormality in the lumbar spine. 2. Intermittent lower thoracic and lumbar chronic disc and endplate degeneration. Mild lumbar facet hypertrophy. No lumbar spinal stenosis or convincing lumbar neural impingement. Mild to moderate bilateral T10 and right T11 neural foraminal stenosis.  Cervical MRI: IMPRESSION: 1. No acute osseous abnormality. Multilevel cervical spine degeneration but capacious spinal canal. No spinal stenosis. 2. Generally mild cervical neural foraminal stenosis due to  facet and/or disc osteophyte degeneration. Up to moderate left C8 nerve level foraminal stenosis.  PATIENT SURVEYS:  FOTO Neck 57% function to 61% predicted;  lumbar 46% function to 51% predicted   SCREENING FOR RED  FLAGS: Bowel or bladder incontinence: No Spinal tumors: No Cauda equina syndrome: No Compression fracture: No Abdominal aneurysm: No  COGNITION: Overall cognitive status: Within functional limits for tasks assessed     SENSATION: WFL  MUSCLE LENGTH: Hamstrings: moderate tightness bilaterally   POSTURE: rounded shoulders, forward head, and decreased lumbar lordosis  PALPATION: TTP cervical paraspinals, Rt upper trap, lumbar paraspinals   LUMBAR ROM:   AROM eval  Flexion WNL  Extension 50% limited   Right lateral flexion WNL  Left lateral flexion WNL  Right rotation WNL  Left rotation WNL   (Blank rows = not tested) pain with all lumbar AROM, most noted with extension   LOWER EXTREMITY ROM:     Active  Right eval Left eval  Hip flexion    Hip extension    Hip abduction    Hip adduction    Hip internal rotation    Hip external rotation    Knee flexion    Knee extension    Ankle dorsiflexion    Ankle plantarflexion    Ankle inversion    Ankle eversion     (Blank rows = not tested)  LOWER EXTREMITY MMT:    MMT Right eval Left eval  Hip flexion 4- 5  Hip extension 4 4  Hip abduction 4 4  Hip adduction    Hip internal rotation    Hip external rotation    Knee flexion 4+   Knee extension 5 5  Ankle dorsiflexion 4+ 5  Ankle plantarflexion 5 in sitting 5  Ankle inversion    Ankle eversion 4+ 5   (Blank rows = not tested) CERVICAL ROM:   Active ROM A/PROM (deg) eval AROM 03/05/23 AROM 7/224 AROM 04/21/23  Flexion 40     Extension 50     Right lateral flexion 20     Left lateral flexion 30     Right rotation 62   65  Left rotation 50  55 60   (Blank rows = not tested) pain with all cervical AROM UPPER EXTREMITY MMT:  MMT  Right eval Left eval  Shoulder flexion 4 5  Shoulder extension    Shoulder abduction 5 5  Shoulder adduction    Shoulder extension    Shoulder internal rotation    Shoulder external rotation    Middle trapezius 4 4  Lower trapezius    Elbow flexion    Elbow extension    Wrist flexion    Wrist extension    Wrist ulnar deviation    Wrist radial deviation    Wrist pronation    Wrist supination    Grip strength     (Blank rows = not tested)  SPECIAL TESTS:  (+) Cervical compression/distraction  (+) SLR  FUNCTIONAL TESTS:  Squat: WBOS, poor hinge; increased pain    GAIT: Distance walked: 10 ft  Assistive device utilized: None Level of assistance: Complete Independence Comments: WNL  [facility] Adult PT Treatment:                                                DATE: 06/09/23 Therapeutic Exercise: Supine chin tucks x10 Supine DNF lift offs Supine Shoulder hor abd star pattern x10 each Chin tuck f/b rotation and side bending x1 each 15 Upper trap stretch x1 15  Seated forward flexion stretch x2 20 Manual Therapy: STM to the  R upper trap  Skilled palpation to identify TrPs and taut muscle bands of R upper traps and cervical suboccipitals Leukotapeof the R upper trap c 3 I strips for inhibition Trigger Point Dry-Needling  Treatment instructions: Expect mild to moderate muscle soreness. S/S of pneumothorax if dry needled over a lung field, and to seek immediate medical attention should they occur. Patient verbalized understanding of these instructions and education.  Patient Consent Given: Yes Education handout provided: Previously provided Muscles treated: R cervical suboccipital and upper trap  Electrical stimulation performed:Yes- see above Treatment response/outcome: twitch response and relief of tension   [facility] Adult PT Treatment:                                                DATE: 06/03/23 Manual Therapy: Skilled palpation to identify TrPs and taut muscle bands of R  upper traps and cervical paraspinals Kinesiotaping of the R upper trap c 3 I strips for inhibition, 90% stretch Modalities: mA Estim to the R mid to lower cervical paraspinals and multifidi and upper traps, 6pps, intesity as tolerated, 15 mins  Trigger Point Dry-Needling  Treatment instructions: Expect mild to moderate muscle soreness. S/S of pneumothorax if dry needled over a lung field, and to seek immediate medical attention should they occur. Patient verbalized understanding of these instructions and education.  Patient Consent Given: Yes Education handout provided: Previously provided Muscles treated: R cervical paraspinal and multifidi and upper trap with Estim. R thoracolumbar paraspinals and multifidi-TPDN only Electrical stimulation performed:Yes- see above Treatment response/outcome: twitch response and relief of tension  [facility] Adult PT Treatment:                                                DATE: 05/19/23 Therapeutic Exercise: Prone chin tucks x10 3 Prone H's x10 Prone I's x10 Prone alt leg lifts x10 Prone alt arm/leg x10 Manual Therapy: STM to the R upper traps, cervical paraspinals, levator, interscap muscle Modalities: mA estim at 6 pps to the R lumbar paraspinals Trigger Point Dry-Needling  Treatment instructions: Expect mild to moderate muscle soreness. S/S of pneumothorax if dry needled over a lung field, and to seek immediate medical attention should they occur. Patient verbalized understanding of these instructions and education.  Patient Consent Given: Yes Education handout provided: Previously provided Muscles treated: R thoracolumbar paraspinals Electrical stimulation performed:Yes- see above Treatment response/outcome: twitch response and relief of tension  [facility] Adult PT Treatment:                                                DATE: 05/14/23 Therapeutic Exercise: Nustep 5 mins L7 UE/LE Standing chin tucks x10 3 Standing shoulder hor abd star pattern  2x5 GTB Standing shoulder ER star pattern 2x5 GTB Cybex leg press 2x10 140# Cybex Hip ext 2x10 each 50# Therapeutic Activity: FOTO- re-assessed and reviewed  Patient Consent Given: Yes Education handout provided: Previously provided Muscles treated: R thoracolumbar paraspinals Electrical stimulation performed:Yes- see above Treatment response/outcome: twitch response and relief of tension   PATIENT EDUCATION:  Education details: see treatment Person educated: Patient  Education method: Explanation, Demonstration, Tactile cues, Verbal cues, and Handouts Education comprehension: verbalized understanding, returned demonstration, verbal cues required, tactile cues required, and needs further education  HOME EXERCISE PROGRAM: Access Code: [email or web address] URL: [email or web address] Date: 04/21/2023 Prepared by: Dasie Daft  Exercises - Supine Double Knee to Chest  - 2 x daily - 7 x weekly - 1 sets - 10 reps - 5 sec  hold - Hooklying Clamshell with Resistance  - 1 x daily - 7 x weekly - 2 sets - 10 reps - Supine Chin Tuck  - 2 x daily - 7 x weekly - 2 sets - 10 reps - Supine DNF Liftoffs  - 1 x daily - 7 x weekly - 3 sets - 10 reps - Prone Press Up On Elbows  - 1 x daily - 7 x weekly - 2-4 sets - 10-20 reps - Prone Off Center Lumbar Extension Press Up  - 1 x daily - 7 x weekly - 2-4 sets - 10-20 reps - Prone Alternating Arm and Leg Lifts  - 1 x daily - 7 x weekly - 2 sets - 10 reps - Shoulder Y's  - 1 x daily - 7 x weekly - 2 - 3 sets - 10 reps - Shoulder I's  - 1 x daily - 7 x weekly - 2 sets - 10 reps - Shoulder T's  - 1 x daily - 7 x weekly - 2-3 sets - 10 reps - Doorway Pec Stretch at 90 Degrees Abduction  - 1 x daily - 7 x weekly - 1 sets - 3 reps - 2 hold - Shoulder PNF D2 Flexion  - 1 x daily - 7 x weekly - 1 sets - 10 reps - 3 hold - Standing Thoracic Open Book at Wall  - 1 x daily - 7 x weekly - 3 sets - 10 reps - 3 hold  ASSESSMENT:  CLINICAL IMPRESSION: PT was  completed primarily to address R suboccipital and and upper trap pain per STM f/b TPDN. Therex was then completed for cervical ROM and postural/posterior chain strengthening. Inhibition taping was also completed c leukotape. Pt's symptoms continue to fluctuate, but generally he finds his stretching therex to be helpful with decreasing his R neck, upper shoulder, and low back pain. Pt tolerated PT today without adverse effects. Will progress pt toward symptom management of his chronic pain per a HEP.    Evaluation: Patient is a 62 y.o. male well known to our clinic who was seen today for physical therapy evaluation and treatment for chronic neck and back pain that has been ongoing for years. Upon assessment he is noted to have slightly limited cervical AROM reporting pain with all ranges and limited and painful lumbar extension AROM, bilateral hip weakness, Rt knee and ankle weakness, postural abnormalities, and aberrant lifting mechanics. He will benefit from skilled PT to address the above stated deficits in order to optimize his function and assist in overall pain reduction.   OBJECTIVE IMPAIRMENTS: decreased activity tolerance, decreased endurance, difficulty walking, decreased ROM, decreased strength, increased fascial restrictions, impaired UE functional use, improper body mechanics, postural dysfunction, and pain.   ACTIVITY LIMITATIONS: carrying, lifting, bending, sitting, standing, squatting, and locomotion level  PARTICIPATION LIMITATIONS: meal prep, cleaning, laundry, driving, shopping, community activity, and yard work  PERSONAL FACTORS: Age, Fitness, Profession, Time since onset of injury/illness/exacerbation, and 3+ comorbidities: see PMH above are also affecting patient's functional outcome.   REHAB POTENTIAL: Fair chronicity of injury, co-morbidities, previous PT  CLINICAL  DECISION MAKING: Evolving/moderate complexity  EVALUATION COMPLEXITY: Moderate   GOALS: Goals reviewed with  patient? Yes  SHORT TERM GOALS: Target date: 03/04/2023   Patient will be independent and compliant with initial HEP.  Baseline: see above Goal status: Ind  2.  Patient will demonstrate at least 60 degrees of left cervical rotation AROM to improve ability to complete head turns while driving.  Baseline: see above  Status: L rotation 55d Goal status: Improved, but not met  3.  Patient will demonstrate proper hip hinge to reduce stress on back with lifting activity.  Baseline: see above  Goal status: No met  LONG TERM GOALS: Target date: 06/26/2023  Patient will score at least 61% neck and 51% lumbar on FOTO to signify clinically meaningful improvement in functional abilities.   Baseline: see above 05/14/23: Neck 61%, lumbar 54% Goal status: MET  2.  Patient will be independent with advanced home program to assist in management of his chronic conditions.  Baseline: see above Goal status: Ongoing  3.  Patient will demonstrate 5/5 hip strength to improve stability about the chain with prolonged walking/standing activity.  Baseline: see above Goal status: Ongoing  4.  Patient will be able to lift at least 20 lbs with minimal pain.  Baseline: see above Goal status: On hold  5. Patient will demonstrate 5/5 bilateral middle trap strength to improve postural stability.   Baseline:see above  Goal status: Ongoing  PLAN:  PT FREQUENCY: 2x/week  PT DURATION: 4 weeks  PLANNED INTERVENTIONS: Therapeutic exercises, Therapeutic activity, Neuromuscular re-education, Balance training, Gait training, Patient/Family education, Self Care, Aquatic Therapy, Dry Needling, Electrical stimulation, Cryotherapy, Moist heat, Traction, Manual therapy, and Re-evaluation.  PLAN FOR NEXT SESSION: review and progress HEP prn; cervical and lumbar mobility, postural and core strengthening.   Cina Klumpp MS, PT 06/09/23 3:44 PM  PHYSICAL THERAPY DISCHARGE SUMMARY  Visits from Start of Care:  11  Current functional level related to goals / functional outcomes: See clinical impression and PT goals. Unknown: not returning since the last visit.    Remaining deficits: See clinical impression and PT goals. Unknown: not returning since the last visit.    Education / Equipment: HEP/Pt Ed   Patient agrees to discharge. Patient goals were not met. Patient is being discharged due to not returning since the last visit.

## 2023-06-09 ENCOUNTER — Other Ambulatory Visit: Payer: Self-pay

## 2023-06-09 ENCOUNTER — Ambulatory Visit: Payer: 59

## 2023-06-09 DIAGNOSIS — M25511 Pain in right shoulder: Secondary | ICD-10-CM | POA: Diagnosis not present

## 2023-06-09 DIAGNOSIS — M5441 Lumbago with sciatica, right side: Secondary | ICD-10-CM | POA: Diagnosis not present

## 2023-06-09 DIAGNOSIS — G8929 Other chronic pain: Secondary | ICD-10-CM

## 2023-06-09 DIAGNOSIS — M542 Cervicalgia: Secondary | ICD-10-CM | POA: Diagnosis not present

## 2023-06-09 DIAGNOSIS — M6281 Muscle weakness (generalized): Secondary | ICD-10-CM | POA: Diagnosis not present

## 2023-06-09 DIAGNOSIS — R293 Abnormal posture: Secondary | ICD-10-CM | POA: Diagnosis not present

## 2023-06-10 ENCOUNTER — Other Ambulatory Visit (HOSPITAL_COMMUNITY): Payer: Self-pay

## 2023-06-10 DIAGNOSIS — Z4789 Encounter for other orthopedic aftercare: Secondary | ICD-10-CM | POA: Diagnosis not present

## 2023-06-10 DIAGNOSIS — M1812 Unilateral primary osteoarthritis of first carpometacarpal joint, left hand: Secondary | ICD-10-CM | POA: Diagnosis not present

## 2023-06-22 ENCOUNTER — Other Ambulatory Visit (HOSPITAL_COMMUNITY): Payer: Self-pay

## 2023-06-23 ENCOUNTER — Ambulatory Visit

## 2023-06-25 DIAGNOSIS — M47812 Spondylosis without myelopathy or radiculopathy, cervical region: Secondary | ICD-10-CM | POA: Diagnosis not present

## 2023-06-29 ENCOUNTER — Other Ambulatory Visit (HOSPITAL_COMMUNITY): Payer: Self-pay

## 2023-07-10 ENCOUNTER — Other Ambulatory Visit (HOSPITAL_COMMUNITY): Payer: Self-pay

## 2023-07-10 MED ORDER — COVID-19 MRNA VAC-TRIS(PFIZER) 30 MCG/0.3ML IM SUSY
0.3000 mL | PREFILLED_SYRINGE | Freq: Once | INTRAMUSCULAR | 0 refills | Status: AC
  Filled 2023-07-10: qty 0.3, 1d supply, fill #0

## 2023-07-10 MED ORDER — INFLUENZA VIRUS VACC SPLIT PF (FLUZONE) 0.5 ML IM SUSY
0.5000 mL | PREFILLED_SYRINGE | Freq: Once | INTRAMUSCULAR | 0 refills | Status: AC
  Filled 2023-07-10: qty 0.5, 1d supply, fill #0

## 2023-07-14 DIAGNOSIS — M5416 Radiculopathy, lumbar region: Secondary | ICD-10-CM | POA: Diagnosis not present

## 2023-07-22 DIAGNOSIS — M9901 Segmental and somatic dysfunction of cervical region: Secondary | ICD-10-CM | POA: Diagnosis not present

## 2023-07-22 DIAGNOSIS — M9903 Segmental and somatic dysfunction of lumbar region: Secondary | ICD-10-CM | POA: Diagnosis not present

## 2023-07-22 DIAGNOSIS — M9904 Segmental and somatic dysfunction of sacral region: Secondary | ICD-10-CM | POA: Diagnosis not present

## 2023-07-22 DIAGNOSIS — M9902 Segmental and somatic dysfunction of thoracic region: Secondary | ICD-10-CM | POA: Diagnosis not present

## 2023-07-24 ENCOUNTER — Other Ambulatory Visit (HOSPITAL_COMMUNITY): Payer: Self-pay

## 2023-07-24 MED ORDER — METHOCARBAMOL 500 MG PO TABS
500.0000 mg | ORAL_TABLET | Freq: Three times a day (TID) | ORAL | 0 refills | Status: AC | PRN
  Filled 2023-07-24 (×2): qty 90, 30d supply, fill #0

## 2023-07-24 MED ORDER — FINASTERIDE 5 MG PO TABS
5.0000 mg | ORAL_TABLET | Freq: Every day | ORAL | 3 refills | Status: AC
  Filled 2023-07-24 – 2023-07-31 (×3): qty 90, 90d supply, fill #0
  Filled 2023-11-09: qty 90, 90d supply, fill #1
  Filled 2023-11-17 – 2024-02-08 (×2): qty 90, 90d supply, fill #2
  Filled 2024-05-07: qty 90, 90d supply, fill #3

## 2023-07-24 MED ORDER — BACLOFEN 10 MG PO TABS
10.0000 mg | ORAL_TABLET | Freq: Two times a day (BID) | ORAL | 4 refills | Status: DC | PRN
  Filled 2023-07-24 (×2): qty 360, 90d supply, fill #0

## 2023-07-24 MED ORDER — TADALAFIL 5 MG PO TABS
5.0000 mg | ORAL_TABLET | Freq: Every day | ORAL | 3 refills | Status: DC
  Filled 2023-07-24 – 2023-11-18 (×4): qty 90, 90d supply, fill #0
  Filled 2024-02-15 – 2024-02-17 (×2): qty 90, 90d supply, fill #1

## 2023-07-24 MED ORDER — BACLOFEN 10 MG PO TABS
10.0000 mg | ORAL_TABLET | Freq: Two times a day (BID) | ORAL | 0 refills | Status: DC | PRN
Start: 2023-07-24 — End: 2023-08-03
  Filled 2023-07-24: qty 360, 90d supply, fill #0

## 2023-07-24 MED ORDER — METFORMIN HCL 1000 MG PO TABS
1000.0000 mg | ORAL_TABLET | Freq: Two times a day (BID) | ORAL | 3 refills | Status: DC
  Filled 2023-07-24: qty 180, 90d supply, fill #0

## 2023-07-24 MED ORDER — ZOLPIDEM TARTRATE 10 MG PO TABS
10.0000 mg | ORAL_TABLET | Freq: Every evening | ORAL | 0 refills | Status: AC | PRN
Start: 2023-07-24 — End: ?

## 2023-07-24 MED ORDER — TRAMADOL HCL 50 MG PO TABS
50.0000 mg | ORAL_TABLET | Freq: Four times a day (QID) | ORAL | 0 refills | Status: AC | PRN
Start: 2023-07-24 — End: ?

## 2023-07-24 MED ORDER — OXYCODONE HCL 5 MG PO TABS
5.0000 mg | ORAL_TABLET | Freq: Three times a day (TID) | ORAL | 0 refills | Status: AC | PRN
Start: 2023-07-24 — End: ?

## 2023-07-24 MED ORDER — GABAPENTIN 600 MG PO TABS
600.0000 mg | ORAL_TABLET | Freq: Three times a day (TID) | ORAL | 3 refills | Status: DC
  Filled 2023-07-24: qty 270, 90d supply, fill #0

## 2023-07-24 MED ORDER — GABAPENTIN 600 MG PO TABS
600.0000 mg | ORAL_TABLET | Freq: Three times a day (TID) | ORAL | 1 refills | Status: DC
  Filled 2023-07-24 (×2): qty 270, 90d supply, fill #0
  Filled 2023-11-09 (×2): qty 270, 90d supply, fill #1

## 2023-07-24 MED ORDER — DIAZEPAM 5 MG PO TABS
5.0000 mg | ORAL_TABLET | Freq: Two times a day (BID) | ORAL | 0 refills | Status: DC
Start: 2023-07-24 — End: 2023-08-03

## 2023-07-24 MED ORDER — METFORMIN HCL 1000 MG PO TABS
1000.0000 mg | ORAL_TABLET | Freq: Two times a day (BID) | ORAL | 3 refills | Status: DC
  Filled 2023-07-24 (×2): qty 180, 90d supply, fill #0
  Filled 2023-11-09 – 2023-11-10 (×2): qty 180, 90d supply, fill #1
  Filled 2024-02-08: qty 180, 90d supply, fill #2
  Filled 2024-05-07: qty 180, 90d supply, fill #3

## 2023-07-27 ENCOUNTER — Other Ambulatory Visit (HOSPITAL_COMMUNITY): Payer: Self-pay

## 2023-07-27 ENCOUNTER — Other Ambulatory Visit: Payer: Self-pay

## 2023-07-27 MED ORDER — METHOCARBAMOL 500 MG PO TABS
500.0000 mg | ORAL_TABLET | Freq: Four times a day (QID) | ORAL | 0 refills | Status: AC | PRN
  Filled 2024-04-13: qty 40, 10d supply, fill #0

## 2023-07-27 NOTE — Progress Notes (Signed)
Sent message, via epic in basket, requesting orders in epic from surgeon.  

## 2023-07-28 ENCOUNTER — Other Ambulatory Visit (HOSPITAL_COMMUNITY): Payer: Self-pay

## 2023-07-28 NOTE — Patient Instructions (Addendum)
SURGICAL WAITING ROOM VISITATION  Patients having surgery or a procedure may have no more than 2 support people in the waiting area - these visitors may rotate.    Children under the age of 25 must have an adult with them who is not the patient.  Due to an increase in RSV and influenza rates and associated hospitalizations, children ages 53 and under may not visit patients in [facility] hospitals.  If the patient needs to stay at the hospital during part of their recovery, the visitor guidelines for inpatient rooms apply. Pre-op nurse will coordinate an appropriate time for 1 support person to accompany patient in pre-op.  This support person may not rotate.    Please refer to the [facility] website for the visitor guidelines for Inpatients (after your surgery is over and you are in a regular room).       Your procedure is scheduled on: 08/12/23    Report to [facility] Main Entrance    Report to admitting at  1100 AM   Call this number if you have problems the morning of surgery [phone]   Do not eat food  or drink liquids :After Midnight.  fter Midnight you may have the following liquids until __1000____ [place] OF SURGERY                            If you have questions, please contact your surgeon's office.      Oral Hygiene is also important to reduce your risk of infection.                                    Remember - BRUSH YOUR TEETH THE MORNING OF SURGERY WITH YOUR REGULAR TOOTHPASTE  DENTURES WILL BE REMOVED PRIOR TO SURGERY PLEASE DO NOT APPLY "Poly grip" OR ADHESIVES!!!   Do NOT smoke after Midnight   Stop all vitamins and herbal supplements 7 days before surgery.   Take these medicines the morning of surgery with A SIP OF WATER: proscar, gabapentin, protonix, rapaflo                Metformin- none day of surgery              Mounjaro- Hold for 7 days prior to surgery - last dose on   DO NOT TAKE ANY ORAL DIABETIC MEDICATIONS DAY OF YOUR  SURGERY  Bring CPAP mask and tubing day of surgery.                              You may not have any metal on your body including hair pins, jewelry, and body piercing             Do not wear make-up, lotions, powders, perfumes/cologne, or deodorant  Do not wear nail polish including gel and S&S, artificial/acrylic nails, or any other type of covering on natural nails including finger and toenails. If you have artificial nails, gel coating, etc. that needs to be removed by a nail salon please have this removed prior to surgery or surgery may need to be canceled/ delayed if the surgeon/ anesthesia feels like they are unable to be safely monitored.   Do not shave  48 hours prior to surgery.  Men may shave face and neck.   Do not bring valuables to the hospital. [facility] IS NOT             RESPONSIBLE   FOR VALUABLES.   Contacts, glasses, dentures or bridgework may not be worn into surgery.   Bring small overnight bag day of surgery.   DO NOT BRING YOUR HOME MEDICATIONS TO THE HOSPITAL. PHARMACY WILL DISPENSE MEDICATIONS LISTED ON YOUR MEDICATION LIST TO YOU DURING YOUR ADMISSION IN THE HOSPITAL!    Patients discharged on the day of surgery will not be allowed to drive home.  Someone NEEDS to stay with you for the first 24 hours after anesthesia.   Special Instructions: Bring a copy of your healthcare power of attorney and living will documents the day of surgery if you haven't scanned them before.              Please read over the following fact sheets you were given: IF YOU HAVE QUESTIONS ABOUT YOUR PRE-OP INSTRUCTIONS PLEASE CALL [phone]   If you received a COVID test during your pre-op visit  it is requested that you wear a mask when out in public, stay away from anyone that may not be feeling well and notify your surgeon if you develop symptoms. If you test positive for Covid or have been in contact with anyone that has tested positive in the last 10 days please  notify you surgeon.    [facility] - Preparing for Surgery Before surgery, you can play an important role.  Because skin is not sterile, your skin needs to be as free of germs as possible.  You can reduce the number of germs on your skin by washing with CHG (chlorahexidine gluconate) soap before surgery.  CHG is an antiseptic cleaner which kills germs and bonds with the skin to continue killing germs even after washing. Please DO NOT use if you have an allergy to CHG or antibacterial soaps.  If your skin becomes reddened/irritated stop using the CHG and inform your nurse when you arrive at Short Stay. Do not shave (including legs and underarms) for at least 48 hours prior to the first CHG shower.  You may shave your face/neck. Please follow these instructions carefully:  1.  Shower with CHG Soap the night before surgery and the  morning of Surgery.  2.  If you choose to wash your hair, wash your hair first as usual with your  normal  shampoo.  3.  After you shampoo, rinse your hair and body thoroughly to remove the  shampoo.                           4.  Use CHG as you would any other liquid soap.  You can apply chg directly  to the skin and wash                       Gently with a scrungie or clean washcloth.  5.  Apply the CHG Soap to your body ONLY FROM THE NECK DOWN.   Do not use on face/ open                           Wound or open sores. Avoid contact with eyes, ears mouth and genitals (private parts).  Wash face,  Genitals (private parts) with your normal soap.             6.  Wash thoroughly, paying special attention to the area where your surgery  will be performed.  7.  Thoroughly rinse your body with warm water from the neck down.  8.  DO NOT shower/wash with your normal soap after using and rinsing off  the CHG Soap.                9.  Pat yourself dry with a clean towel.            10.  Wear clean pajamas.            11.  Place clean sheets on your bed the night  of your first shower and do not  sleep with pets. Day of Surgery : Do not apply any lotions/deodorants the morning of surgery.  Please wear clean clothes to the hospital/surgery center.  FAILURE TO FOLLOW THESE INSTRUCTIONS MAY RESULT IN THE CANCELLATION OF YOUR SURGERY PATIENT SIGNATURE_________________________________  NURSE SIGNATURE__________________________________  ________________________________________________________________________

## 2023-07-28 NOTE — Progress Notes (Addendum)
Anesthesia Review:  PCP: DR Jarome Matin [phone] LOV 07/30/23 on chart - Yearly Physical.   Cardiologist  none  Chest x-ray : EKG : 08/03/23  Echo : Stress test: CT Cors- 2022  Cardiac Cath :  Activity level: can do a flight of stairs without difficutly  Sleep Study/ CPAP  none  Fasting Blood Sugar :      / Checks Blood Sugar -- times a day:   Blood Thinner/ Instructions /Last Dose: ASA / Instructions/ Last Dose :    Xarelto - PT has not yet been given preop instructions. Called and [facility] for [facility] and informed that pt needs preop Xarelto instructions.     DM- 2- does not check gluocse at home   Hgba1c- 07/31/23 - 5.5 on 07/29/23  Metformin- none day of surgeyr  Mounjaro- last dose on 08/03/23    Called DR Ivery Quale office and have requested labs and OV from 07/31/23- Annual Physical CBC/Diff- 07/29/23 on chart  CMP-07/29/23 on chart.     Blood pressure was 147/97 at preop appt.  PT denies any chest pain, shortness of breath, dizziness, headache or blurred vision at preop appt.    Called Pharm Tech to see pt . Made copy of med list pt brought and gave to her.  Pharm Tech to call me once meds reconciled.    NO orders at time of preop appt.

## 2023-07-29 DIAGNOSIS — R972 Elevated prostate specific antigen [PSA]: Secondary | ICD-10-CM | POA: Diagnosis not present

## 2023-07-29 DIAGNOSIS — I10 Essential (primary) hypertension: Secondary | ICD-10-CM | POA: Diagnosis not present

## 2023-07-29 DIAGNOSIS — E1151 Type 2 diabetes mellitus with diabetic peripheral angiopathy without gangrene: Secondary | ICD-10-CM | POA: Diagnosis not present

## 2023-07-29 DIAGNOSIS — Z1389 Encounter for screening for other disorder: Secondary | ICD-10-CM | POA: Diagnosis not present

## 2023-07-29 DIAGNOSIS — E785 Hyperlipidemia, unspecified: Secondary | ICD-10-CM | POA: Diagnosis not present

## 2023-07-30 ENCOUNTER — Other Ambulatory Visit: Payer: Self-pay

## 2023-07-30 ENCOUNTER — Other Ambulatory Visit (HOSPITAL_COMMUNITY): Payer: Self-pay

## 2023-07-30 DIAGNOSIS — Z0000 Encounter for general adult medical examination without abnormal findings: Secondary | ICD-10-CM | POA: Diagnosis not present

## 2023-07-30 DIAGNOSIS — M170 Bilateral primary osteoarthritis of knee: Secondary | ICD-10-CM | POA: Diagnosis not present

## 2023-07-30 DIAGNOSIS — Z1331 Encounter for screening for depression: Secondary | ICD-10-CM | POA: Diagnosis not present

## 2023-07-30 DIAGNOSIS — Z86711 Personal history of pulmonary embolism: Secondary | ICD-10-CM | POA: Diagnosis not present

## 2023-07-30 DIAGNOSIS — I10 Essential (primary) hypertension: Secondary | ICD-10-CM | POA: Diagnosis not present

## 2023-07-30 DIAGNOSIS — I739 Peripheral vascular disease, unspecified: Secondary | ICD-10-CM | POA: Diagnosis not present

## 2023-07-30 DIAGNOSIS — R82998 Other abnormal findings in urine: Secondary | ICD-10-CM | POA: Diagnosis not present

## 2023-07-30 DIAGNOSIS — E1151 Type 2 diabetes mellitus with diabetic peripheral angiopathy without gangrene: Secondary | ICD-10-CM | POA: Diagnosis not present

## 2023-07-30 DIAGNOSIS — G894 Chronic pain syndrome: Secondary | ICD-10-CM | POA: Diagnosis not present

## 2023-07-30 DIAGNOSIS — Z1339 Encounter for screening examination for other mental health and behavioral disorders: Secondary | ICD-10-CM | POA: Diagnosis not present

## 2023-07-30 MED ORDER — CYCLOBENZAPRINE HCL 10 MG PO TABS
10.0000 mg | ORAL_TABLET | Freq: Three times a day (TID) | ORAL | 1 refills | Status: AC | PRN
  Filled 2023-07-30 – 2023-07-31 (×2): qty 270, 90d supply, fill #0

## 2023-07-30 MED ORDER — TRAMADOL HCL 50 MG PO TABS
50.0000 mg | ORAL_TABLET | Freq: Four times a day (QID) | ORAL | 0 refills | Status: AC | PRN
  Filled 2023-07-30: qty 240, 30d supply, fill #0

## 2023-07-30 MED ORDER — GABAPENTIN 600 MG PO TABS
600.0000 mg | ORAL_TABLET | Freq: Three times a day (TID) | ORAL | 3 refills | Status: DC
  Filled 2023-07-30: qty 270, 90d supply, fill #0

## 2023-07-30 MED ORDER — ROSUVASTATIN CALCIUM 20 MG PO TABS
20.0000 mg | ORAL_TABLET | Freq: Every day | ORAL | 3 refills | Status: AC
  Filled 2023-07-30 – 2024-05-05 (×8): qty 90, 90d supply, fill #0

## 2023-07-30 MED ORDER — SILODOSIN 8 MG PO CAPS
8.0000 mg | ORAL_CAPSULE | Freq: Two times a day (BID) | ORAL | 3 refills | Status: DC
  Filled 2023-07-30: qty 180, 90d supply, fill #0

## 2023-07-30 MED ORDER — DIAZEPAM 5 MG PO TABS
5.0000 mg | ORAL_TABLET | Freq: Two times a day (BID) | ORAL | 0 refills | Status: AC
  Filled 2023-07-30: qty 60, 30d supply, fill #0

## 2023-07-30 MED ORDER — TOPIRAMATE 25 MG PO TABS
25.0000 mg | ORAL_TABLET | Freq: Every evening | ORAL | 3 refills | Status: DC
  Filled 2023-07-30 – 2023-11-09 (×2): qty 90, 90d supply, fill #0
  Filled 2024-02-08: qty 90, 90d supply, fill #1
  Filled 2024-05-07: qty 90, 90d supply, fill #2

## 2023-07-30 MED ORDER — RIZATRIPTAN BENZOATE 5 MG PO TBDP
ORAL_TABLET | ORAL | 3 refills | Status: DC
  Filled 2023-07-30: qty 15, 30d supply, fill #0
  Filled 2023-12-09: qty 15, 30d supply, fill #1
  Filled 2024-01-08 – 2024-01-11 (×2): qty 15, 30d supply, fill #2
  Filled 2024-02-08: qty 15, 30d supply, fill #3

## 2023-07-30 MED ORDER — BUTALBITAL-APAP-CAFFEINE 50-325-40 MG PO TABS
1.0000 | ORAL_TABLET | ORAL | 0 refills | Status: AC | PRN
Start: 2023-07-30 — End: ?
  Filled 2023-07-30: qty 180, 30d supply, fill #0

## 2023-07-30 MED ORDER — ZOLPIDEM TARTRATE 10 MG PO TABS
10.0000 mg | ORAL_TABLET | Freq: Every evening | ORAL | 0 refills | Status: DC | PRN
  Filled 2023-07-30: qty 30, 30d supply, fill #0

## 2023-07-30 MED ORDER — OXYCODONE HCL 5 MG PO TABS
5.0000 mg | ORAL_TABLET | Freq: Three times a day (TID) | ORAL | 0 refills | Status: AC | PRN
  Filled 2023-07-30: qty 180, 30d supply, fill #0

## 2023-07-30 MED ORDER — MOVANTIK 25 MG PO TABS
25.0000 mg | ORAL_TABLET | Freq: Every morning | ORAL | 3 refills | Status: AC
  Filled 2023-07-30: qty 90, 90d supply, fill #0
  Filled 2023-11-09: qty 30, 30d supply, fill #0
  Filled 2023-12-15: qty 30, 30d supply, fill #1
  Filled 2024-01-12: qty 30, 30d supply, fill #2
  Filled 2024-02-10: qty 30, 30d supply, fill #3
  Filled 2024-03-08: qty 30, 30d supply, fill #4
  Filled 2024-04-08: qty 30, 30d supply, fill #5
  Filled 2024-05-14: qty 30, 30d supply, fill #6
  Filled 2024-06-13: qty 30, 30d supply, fill #7

## 2023-07-30 MED ORDER — METHOCARBAMOL 500 MG PO TABS
500.0000 mg | ORAL_TABLET | Freq: Two times a day (BID) | ORAL | 0 refills | Status: AC | PRN
  Filled 2023-07-30 – 2023-11-16 (×2): qty 180, 90d supply, fill #0

## 2023-07-30 MED ORDER — MOUNJARO 10 MG/0.5ML ~~LOC~~ SOAJ
10.0000 mg | SUBCUTANEOUS | 1 refills | Status: AC
  Filled 2023-07-30 – 2023-08-28 (×5): qty 6, 84d supply, fill #0
  Filled 2024-02-10: qty 6, 84d supply, fill #1

## 2023-07-30 MED ORDER — TADALAFIL 5 MG PO TABS
ORAL_TABLET | ORAL | 3 refills | Status: DC
  Filled 2023-07-30: qty 90, 90d supply, fill #0
  Filled 2023-11-09: qty 90, 90d supply, fill #1
  Filled 2024-02-08: qty 90, 90d supply, fill #2
  Filled 2024-05-07 – 2024-05-09 (×2): qty 90, 90d supply, fill #3

## 2023-07-30 MED ORDER — NURTEC 75 MG PO TBDP
ORAL_TABLET | ORAL | 3 refills | Status: DC
Start: 2023-07-30 — End: 2024-02-17
  Filled 2023-07-30 – 2023-08-03 (×2): qty 18, 30d supply, fill #0
  Filled 2023-11-09 (×2): qty 18, 30d supply, fill #1
  Filled 2023-12-09: qty 18, 30d supply, fill #2
  Filled 2024-01-08 – 2024-01-09 (×2): qty 18, 30d supply, fill #3

## 2023-07-30 MED ORDER — XARELTO 20 MG PO TABS
20.0000 mg | ORAL_TABLET | Freq: Every day | ORAL | 3 refills | Status: DC
  Filled 2023-07-30 – 2023-11-09 (×2): qty 90, 90d supply, fill #0
  Filled 2024-02-08: qty 90, 90d supply, fill #1
  Filled 2024-05-07: qty 90, 90d supply, fill #2

## 2023-07-30 MED ORDER — BACLOFEN 10 MG PO TABS
10.0000 mg | ORAL_TABLET | Freq: Two times a day (BID) | ORAL | 1 refills | Status: AC | PRN
  Filled 2023-07-30 – 2023-07-31 (×2): qty 360, 90d supply, fill #0

## 2023-07-30 MED ORDER — METFORMIN HCL 1000 MG PO TABS
1000.0000 mg | ORAL_TABLET | Freq: Two times a day (BID) | ORAL | 3 refills | Status: DC
  Filled 2023-07-30: qty 180, 90d supply, fill #0

## 2023-07-30 MED ORDER — FINASTERIDE 5 MG PO TABS
5.0000 mg | ORAL_TABLET | Freq: Every day | ORAL | 3 refills | Status: DC
  Filled 2023-07-30: qty 90, 90d supply, fill #0

## 2023-07-30 MED ORDER — PANTOPRAZOLE SODIUM 40 MG PO TBEC
40.0000 mg | DELAYED_RELEASE_TABLET | Freq: Every day | ORAL | 3 refills | Status: DC
  Filled 2023-07-30 – 2023-11-09 (×2): qty 90, 90d supply, fill #0
  Filled 2024-02-08: qty 90, 90d supply, fill #1
  Filled 2024-05-07: qty 90, 90d supply, fill #2

## 2023-07-31 ENCOUNTER — Other Ambulatory Visit (HOSPITAL_COMMUNITY): Payer: Self-pay

## 2023-08-03 ENCOUNTER — Encounter (HOSPITAL_COMMUNITY): Payer: Self-pay

## 2023-08-03 ENCOUNTER — Other Ambulatory Visit: Payer: Self-pay

## 2023-08-03 ENCOUNTER — Other Ambulatory Visit (HOSPITAL_COMMUNITY): Payer: Self-pay

## 2023-08-03 ENCOUNTER — Encounter (HOSPITAL_COMMUNITY)
Admission: RE | Admit: 2023-08-03 | Discharge: 2023-08-03 | Disposition: A | Discharge status: Home / Self Care | Payer: 59 | Source: Ambulatory Visit | Attending: Urology | Admitting: Urology

## 2023-08-03 VITALS — BP 147/97 | HR 79 | Temp 98.4°F | Resp 16 | Ht 72.0 in | Wt 273.0 lb

## 2023-08-03 DIAGNOSIS — Z01818 Encounter for other preprocedural examination: Secondary | ICD-10-CM | POA: Insufficient documentation

## 2023-08-03 HISTORY — DX: Gastro-esophageal reflux disease without esophagitis: K21.9

## 2023-08-03 LAB — GLUCOSE, CAPILLARY: Glucose-Capillary: 86 mg/dL (ref 70–99)

## 2023-08-04 ENCOUNTER — Encounter (HOSPITAL_COMMUNITY): Payer: Self-pay

## 2023-08-04 NOTE — Progress Notes (Signed)
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  Patient follows with his PCP for chronic medical issues.  Last seen on 07/30/2023.  All medical issues stable at that time.  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  Blood pressure and diabetes are controlled. Advised to f/u in 1 year. Paper copy of note in chart.  Xarelto hold instructions were not given to patient at time of PAT visit. Surgeon's office notified.   VS: BP (!) 147/97   Pulse 79   Temp 36.9 C (Oral)   Resp 16   Ht 6' (1.829 m)   Wt 123.8 kg   SpO2 99%   BMI 37.03 kg/m   PROVIDERS: Garlan Fillers, MD   LABS: Labs reviewed: Acceptable for surgery. (all labs ordered are listed, but only abnormal results are displayed)  Labs Reviewed  GLUCOSE, CAPILLARY     IMAGES:   EKG:  NSR, rate 77   CV:  CTA Coronary 11/27/20:  IMPRESSION: 1. No evidence of CAD, CADRADS = 0.   2. Coronary calcium score of 0. This was 0 percentile for age and sex matched control.   3. Normal coronary origin with right dominance.   4. Consider alternative causes of chest pain.  Past Medical History:  Diagnosis Date   Aortic atherosclerosis (HCC)    BPH (benign prostatic hyperplasia)    Chronic pain syndrome    neck and low back   DDD (degenerative disc disease), cervical    Diverticulosis    DJD (degenerative joint disease)    Ganglion cyst    12 mm , right posterior knee   GERD (gastroesophageal reflux disease)     Hyperlipidemia    Internal hemorrhoids    Migraines    OA (osteoarthritis)    knees, hands, right shoulder   Pneumonia    Positive QuantiFERON-TB Gold test    Shoulder pain    Tear of right rotator cuff    Type 2 diabetes mellitus (HCC)    last A1c 5.4 on 04-13-2018 in epic (followed by pcp)   Wears glasses     Past Surgical History:  Procedure Laterality Date   ANAL FISSURE REPAIR     ARTHOSCOPIC ROTAOR CUFF REPAIR Right 06/15/2018   Procedure: RIGHT SHOULDER ARTHROSCOPY, DEBRIDEMENT, SUBACROMIAL DECOMPRESSION, DISTAL CLAVICLE RESECTION, ROTATOR CUFF REPAIR;  Surgeon: Eugenia Mcalpine, MD;  Location: [facility];  Service: Orthopedics;  Laterality: Right;   COLONOSCOPY  05-26-2017   dr Dineen Kid SIGMOIDOSCOPY N/A 11/05/2021   Procedure: FLEXIBLE SIGMOIDOSCOPY;  Surgeon: Rachael Fee, MD;  Location: [facility];  Service: Endoscopy;  Laterality: N/A;   Hip Resurface  2006   KNEE ARTHROSCOPY W/ MENISCAL REPAIR Bilateral right 1993; left 2010   MASS EXCISION Right 04/21/2018   Procedure: EXCISION RIGHT THIGH SOFT TISSUE MASS;  Surgeon: Ollen Gross, MD;  Location[facility];  Service: Orthopedics;  Laterality: Right;   SEPTOPLASTY     SHOULDER ARTHROSCOPY WITH ROTATOR  CUFF REPAIR Left    SLAP Tear Repair  2008   TONSILLECTOMY     TOTAL KNEE ARTHROPLASTY Left 12/08/2016   Procedure: LEFT TOTAL KNEE ARTHROPLASTY;  Surgeon: Ollen Gross, MD;  [facility];  Service: Orthopedics;  Laterality: Left;   TOTAL KNEE ARTHROPLASTY WITH REVISION COMPONENTS Left 04/21/2018   Procedure: Left knee polyethylene exchange ;  Surgeon: Ollen Gross, MD;  [facility];  Service: Orthopedics;  Laterality: Left;    MEDICATIONS:  baclofen (LIORESAL) 10 MG tablet   baclofen (LIORESAL) 10 MG tablet   butalbital-acetaminophen-caffeine (FIORICET) 50-325-40 MG tablet   butalbital-acetaminophen-caffeine (FIORICET) 50-325-40 MG tablet   Butalbital-APAP-Caffeine (FIORICET)  50-300-40 MG CAPS   cyclobenzaprine (FLEXERIL) 10 MG tablet   cyclobenzaprine (FLEXERIL) 10 MG tablet   diazepam (VALIUM) 5 MG tablet   diazepam (VALIUM) 5 MG tablet   finasteride (PROSCAR) 5 MG tablet   finasteride (PROSCAR) 5 MG tablet   gabapentin (NEURONTIN) 600 MG tablet   metFORMIN (GLUCOPHAGE) 1000 MG tablet   methocarbamol (ROBAXIN) 500 MG tablet   methocarbamol (ROBAXIN) 500 MG tablet   methocarbamol (ROBAXIN) 500 MG tablet   methocarbamol (ROBAXIN) 500 MG tablet   methocarbamol (ROBAXIN) 500 MG tablet   Multiple Vitamins-Minerals (AIRBORNE) CHEW   naloxegol oxalate (MOVANTIK) 25 MG TABS tablet   naloxegol oxalate (MOVANTIK) 25 MG TABS tablet   naloxegol oxalate (MOVANTIK) 25 MG TABS tablet   naloxone (NARCAN) nasal spray 4 mg/0.1 mL   nystatin in diphenhydrAMINE liquid-alum & mag hydroxide-simeth suspension   ondansetron (ZOFRAN) 4 MG tablet   ondansetron (ZOFRAN-ODT) 8 MG disintegrating tablet   OVER THE COUNTER MEDICATION   oxyCODONE (OXY IR/ROXICODONE) 5 MG immediate release tablet   oxyCODONE (OXY IR/ROXICODONE) 5 MG immediate release tablet   oxyCODONE (OXY IR/ROXICODONE) 5 MG immediate release tablet   Oxycodone HCl 10 MG TABS   oxymetazoline (AFRIN) 0.05 % nasal spray   pantoprazole (PROTONIX) 40 MG tablet   pantoprazole (PROTONIX) 40 MG tablet   pantoprazole (PROTONIX) 40 MG tablet   polyethylene glycol (MIRALAX / GLYCOLAX) 17 g packet   predniSONE (DELTASONE) 20 MG tablet   predniSONE (DELTASONE) 50 MG tablet   Psyllium (METAMUCIL) 28.3 % POWD   Respiratory Therapy Supplies (SPIROMETER) KIT   Rimegepant Sulfate (NURTEC) 75 MG TBDP   Rimegepant Sulfate (NURTEC) 75 MG TBDP   Rimegepant Sulfate (NURTEC) 75 MG TBDP   rivaroxaban (XARELTO) 20 MG TABS tablet   rivaroxaban (XARELTO) 20 MG TABS tablet   rivaroxaban (XARELTO) 20 MG TABS tablet   rivaroxaban (XARELTO) 20 MG TABS tablet   rizatriptan (MAXALT-MLT) 5 MG disintegrating tablet   rizatriptan  (MAXALT-MLT) 5 MG disintegrating tablet   rosuvastatin (CRESTOR) 20 MG tablet   rosuvastatin (CRESTOR) 20 MG tablet   RSV vaccine recomb adjuvanted (AREXVY) 120 MCG/0.5ML injection   senna (SENOKOT) 8.6 MG TABS tablet   silodosin (RAPAFLO) 8 MG CAPS capsule   silodosin (RAPAFLO) 8 MG CAPS capsule   tadalafil (CIALIS) 5 MG tablet   tadalafil (CIALIS) 5 MG tablet   tadalafil (CIALIS) 5 MG tablet   tirzepatide (MOUNJARO) 10 MG/0.5ML Pen   tirzepatide (MOUNJARO) 10 MG/0.5ML Pen   tirzepatide (MOUNJARO) 12.5 MG/0.5ML Pen   tirzepatide (MOUNJARO) 5 MG/0.5ML Pen   topiramate (TOPAMAX) 25 MG tablet   topiramate (TOPAMAX) 25 MG tablet   topiramate (TOPAMAX) 25 MG tablet   traMADol (ULTRAM) 50 MG tablet   traMADol (ULTRAM) 50 MG tablet   traMADol (ULTRAM) 50 MG tablet   traMADol (  ULTRAM) 50 MG tablet   zolpidem (AMBIEN) 10 MG tablet   zolpidem (AMBIEN) 10 MG tablet   zolpidem (AMBIEN) 10 MG tablet   zolpidem (AMBIEN) 10 MG tablet   zolpidem (AMBIEN) 10 MG tablet   zolpidem (AMBIEN) 10 MG tablet   zolpidem (AMBIEN) 10 MG tablet   No current facility-administered medications for this encounter.   Marcille Blanco [facility] Short Stay/Anesthesiology [facility] Phone [phone] 08/04/2023 3:34 PM

## 2023-08-04 NOTE — Anesthesia Preprocedure Evaluation (Addendum)
Anesthesia Evaluation  Patient identified by MRN, date of birth, ID band Patient awake    Reviewed: Allergy & Precautions, NPO status , Patient's Chart, lab work & pertinent test results  Airway Mallampati: II  TM Distance: >3 FB Neck ROM: Full    Dental  (+) Teeth Intact, Dental Advisory Given   Pulmonary neg pulmonary ROS   Pulmonary exam normal breath sounds clear to auscultation       Cardiovascular + DVT  Normal cardiovascular exam Rhythm:Regular Rate:Normal     Neuro/Psych  Headaches    GI/Hepatic Neg liver ROS,GERD  Medicated,,  Endo/Other  diabetes, Type 2, Oral Hypoglycemic Agents  Obesity   Renal/GU negative Renal ROS   BPH    Musculoskeletal  (+) Arthritis ,    Abdominal   Peds  Hematology  (+) Blood dyscrasia (Xarelto)   Anesthesia Other Findings Day of surgery medications reviewed with the patient.  Reproductive/Obstetrics                             Anesthesia Physical Anesthesia Plan  ASA: 3  Anesthesia Plan: General   Post-op Pain Management: Tylenol PO (pre-op)*   Induction: Intravenous  PONV Risk Score and Plan: 3 and Midazolam, Dexamethasone and Ondansetron  Airway Management Planned: Oral ETT  Additional Equipment:   Intra-op Plan:   Post-operative Plan: Extubation in OR  Informed Consent: I have reviewed the patients History and Physical, chart, labs and discussed the procedure including the risks, benefits and alternatives for the proposed anesthesia with the patient or authorized representative who has indicated his/her understanding and acceptance.     Dental advisory given  Plan Discussed with: CRNA  Anesthesia Plan Comments: (See PAT note from 10/21 by Sherlie Ban PA-C )        Anesthesia Quick Evaluation

## 2023-08-10 ENCOUNTER — Other Ambulatory Visit (HOSPITAL_COMMUNITY): Payer: Self-pay

## 2023-08-12 ENCOUNTER — Encounter (HOSPITAL_COMMUNITY): Payer: Self-pay | Admitting: Urology

## 2023-08-12 ENCOUNTER — Encounter (HOSPITAL_COMMUNITY)
Admission: RE | Disposition: A | Discharge status: Home / Self Care | Payer: Self-pay | Source: Ambulatory Visit | Attending: Urology

## 2023-08-12 ENCOUNTER — Ambulatory Visit (HOSPITAL_BASED_OUTPATIENT_CLINIC_OR_DEPARTMENT_OTHER): Payer: 59 | Admitting: Anesthesiology

## 2023-08-12 ENCOUNTER — Observation Stay (HOSPITAL_COMMUNITY)
Admission: RE | Admit: 2023-08-12 | Discharge: 2023-08-13 | Disposition: A | Discharge status: Home / Self Care | Payer: 59 | Source: Ambulatory Visit | Attending: Urology | Admitting: Urology

## 2023-08-12 ENCOUNTER — Ambulatory Visit (HOSPITAL_COMMUNITY): Payer: 59 | Admitting: Medical

## 2023-08-12 ENCOUNTER — Other Ambulatory Visit: Payer: Self-pay

## 2023-08-12 DIAGNOSIS — Z01818 Encounter for other preprocedural examination: Secondary | ICD-10-CM

## 2023-08-12 DIAGNOSIS — E119 Type 2 diabetes mellitus without complications: Secondary | ICD-10-CM | POA: Diagnosis not present

## 2023-08-12 DIAGNOSIS — N401 Enlarged prostate with lower urinary tract symptoms: Secondary | ICD-10-CM | POA: Diagnosis not present

## 2023-08-12 DIAGNOSIS — Z96652 Presence of left artificial knee joint: Secondary | ICD-10-CM | POA: Diagnosis not present

## 2023-08-12 DIAGNOSIS — N400 Enlarged prostate without lower urinary tract symptoms: Principal | ICD-10-CM | POA: Diagnosis present

## 2023-08-12 DIAGNOSIS — R3912 Poor urinary stream: Secondary | ICD-10-CM | POA: Diagnosis not present

## 2023-08-12 DIAGNOSIS — R351 Nocturia: Secondary | ICD-10-CM | POA: Diagnosis not present

## 2023-08-12 HISTORY — PX: TRANSURETHRAL RESECTION OF PROSTATE: SHX73

## 2023-08-12 LAB — GLUCOSE, CAPILLARY
Glucose-Capillary: 104 mg/dL — ABNORMAL HIGH (ref 70–99)
Glucose-Capillary: 103 mg/dL — ABNORMAL HIGH (ref 70–99)
Glucose-Capillary: 103 mg/dL — ABNORMAL HIGH (ref 70–99)

## 2023-08-12 LAB — HEMOGLOBIN AND HEMATOCRIT, BLOOD
HCT: 43.8 % (ref 39.0–52.0)
Hemoglobin: 14.1 g/dL (ref 13.0–17.0)

## 2023-08-12 SURGERY — TURP (TRANSURETHRAL RESECTION OF PROSTATE)
Anesthesia: General

## 2023-08-12 MED ORDER — GABAPENTIN 600 MG PO TABS: 600.0000 mg | ORAL_TABLET | Freq: Three times a day (TID) | ORAL | Status: DC

## 2023-08-12 MED ORDER — ESMOLOL HCL 100 MG/10ML IV SOLN
INTRAVENOUS | Status: AC
  Filled 2023-08-12: qty 10

## 2023-08-12 MED ORDER — KETAMINE HCL 50 MG/5ML IJ SOSY
PREFILLED_SYRINGE | INTRAMUSCULAR | Status: AC
Start: 2023-08-12 — End: ?
  Filled 2023-08-12: qty 5

## 2023-08-12 MED ORDER — SENNOSIDES-DOCUSATE SODIUM 8.6-50 MG PO TABS
1.0000 | ORAL_TABLET | Freq: Two times a day (BID) | ORAL | Status: DC
Start: 2023-08-12 — End: 2023-08-13
  Administered 2023-08-12 – 2023-08-13 (×2): 1 via ORAL
  Filled 2023-08-12 (×2): qty 1

## 2023-08-12 MED ORDER — CEFAZOLIN (ANCEF) 1 G IV SOLR: 2.0000 g | INTRAVENOUS | Status: DC

## 2023-08-12 MED ORDER — ONDANSETRON HCL 4 MG/2ML IJ SOLN: 4.0000 mg | Freq: Once | INTRAMUSCULAR | Status: DC | PRN

## 2023-08-12 MED ORDER — FENTANYL CITRATE (PF) 250 MCG/5ML IJ SOLN
INTRAMUSCULAR | Status: AC
  Filled 2023-08-12: qty 5

## 2023-08-12 MED ORDER — PROPOFOL 10 MG/ML IV BOLUS
INTRAVENOUS | Status: AC
  Filled 2023-08-12: qty 20

## 2023-08-12 MED ORDER — SUGAMMADEX SODIUM 200 MG/2ML IV SOLN
INTRAVENOUS | Status: DC | PRN
  Administered 2023-08-12: 250 mg via INTRAVENOUS

## 2023-08-12 MED ORDER — FENTANYL CITRATE PF 50 MCG/ML IJ SOSY
25.0000 ug | PREFILLED_SYRINGE | INTRAMUSCULAR | Status: DC | PRN
  Administered 2023-08-12 (×3): 50 ug via INTRAVENOUS

## 2023-08-12 MED ORDER — DEXAMETHASONE SODIUM PHOSPHATE 10 MG/ML IJ SOLN
INTRAMUSCULAR | Status: AC
  Filled 2023-08-12: qty 1

## 2023-08-12 MED ORDER — PANTOPRAZOLE SODIUM 40 MG PO TBEC
40.0000 mg | DELAYED_RELEASE_TABLET | Freq: Every day | ORAL | Status: DC
  Administered 2023-08-13: 40 mg via ORAL
  Filled 2023-08-12: qty 1

## 2023-08-12 MED ORDER — ORAL CARE MOUTH RINSE: 15.0000 mL | Freq: Once | OROMUCOSAL | Status: AC

## 2023-08-12 MED ORDER — AMISULPRIDE (ANTIEMETIC) 5 MG/2ML IV SOLN: 10.0000 mg | Freq: Once | INTRAVENOUS | Status: DC | PRN

## 2023-08-12 MED ORDER — MORPHINE SULFATE (PF) 4 MG/ML IV SOLN
4.0000 mg | INTRAVENOUS | Status: DC | PRN
  Administered 2023-08-12: 4 mg via INTRAVENOUS
  Filled 2023-08-12: qty 1

## 2023-08-12 MED ORDER — SODIUM CHLORIDE (PF) 0.9 % IJ SOLN
INTRAMUSCULAR | Status: AC
  Filled 2023-08-12: qty 10

## 2023-08-12 MED ORDER — OXYCODONE HCL 5 MG PO TABS
ORAL_TABLET | ORAL | Status: AC
  Filled 2023-08-12: qty 1

## 2023-08-12 MED ORDER — ROCURONIUM BROMIDE 10 MG/ML (PF) SYRINGE
PREFILLED_SYRINGE | INTRAVENOUS | Status: AC
  Filled 2023-08-12: qty 10

## 2023-08-12 MED ORDER — BACLOFEN 10 MG PO TABS
10.0000 mg | ORAL_TABLET | Freq: Two times a day (BID) | ORAL | Status: DC | PRN
  Administered 2023-08-12 (×2): 10 mg via ORAL
  Filled 2023-08-12 (×2): qty 1

## 2023-08-12 MED ORDER — CEFAZOLIN SODIUM-DEXTROSE 2-4 GM/100ML-% IV SOLN
INTRAVENOUS | Status: AC
  Filled 2023-08-12: qty 100

## 2023-08-12 MED ORDER — INSULIN ASPART 100 UNIT/ML IJ SOLN
0.0000 [IU] | INTRAMUSCULAR | Status: DC | PRN
Start: 2023-08-12 — End: 2023-08-12

## 2023-08-12 MED ORDER — ACETAMINOPHEN 10 MG/ML IV SOLN
INTRAVENOUS | Status: AC
  Filled 2023-08-12: qty 100

## 2023-08-12 MED ORDER — NALOXEGOL OXALATE 25 MG PO TABS
25.0000 mg | ORAL_TABLET | Freq: Every day | ORAL | Status: DC
Start: 2023-08-13 — End: 2023-08-13
  Administered 2023-08-13: 25 mg via ORAL
  Filled 2023-08-12: qty 1

## 2023-08-12 MED ORDER — ACETAMINOPHEN 10 MG/ML IV SOLN
INTRAVENOUS | Status: DC | PRN
  Administered 2023-08-12: 1000 mg via INTRAVENOUS

## 2023-08-12 MED ORDER — SODIUM CHLORIDE 0.9 % IR SOLN: 3000.0000 mL | Status: DC

## 2023-08-12 MED ORDER — FENTANYL CITRATE PF 50 MCG/ML IJ SOSY
PREFILLED_SYRINGE | INTRAMUSCULAR | Status: AC
  Filled 2023-08-12: qty 2

## 2023-08-12 MED ORDER — LIDOCAINE HCL (PF) 2 % IJ SOLN
INTRAMUSCULAR | Status: AC
  Filled 2023-08-12: qty 5

## 2023-08-12 MED ORDER — CHLORHEXIDINE GLUCONATE 0.12 % MT SOLN
15.0000 mL | Freq: Once | OROMUCOSAL | Status: AC
  Administered 2023-08-12: 15 mL via OROMUCOSAL

## 2023-08-12 MED ORDER — ACETAMINOPHEN 500 MG PO TABS
1000.0000 mg | ORAL_TABLET | Freq: Once | ORAL | Status: DC
  Filled 2023-08-12: qty 2

## 2023-08-12 MED ORDER — LIDOCAINE HCL (CARDIAC) PF 100 MG/5ML IV SOSY
PREFILLED_SYRINGE | INTRAVENOUS | Status: DC | PRN
  Administered 2023-08-12: 80 mg via INTRAVENOUS

## 2023-08-12 MED ORDER — DEXAMETHASONE SODIUM PHOSPHATE 10 MG/ML IJ SOLN
INTRAMUSCULAR | Status: DC | PRN
  Administered 2023-08-12: 8 mg via INTRAVENOUS

## 2023-08-12 MED ORDER — SODIUM CHLORIDE 0.9 % IR SOLN
Status: DC | PRN
  Administered 2023-08-12: 15000 mL

## 2023-08-12 MED ORDER — DICLOFENAC SODIUM 1 % EX GEL
2.0000 g | Freq: Four times a day (QID) | CUTANEOUS | Status: DC
  Administered 2023-08-12 – 2023-08-13 (×2): 2 g via TOPICAL
  Filled 2023-08-12: qty 100

## 2023-08-12 MED ORDER — FENTANYL CITRATE (PF) 100 MCG/2ML IJ SOLN
INTRAMUSCULAR | Status: AC
  Filled 2023-08-12: qty 2

## 2023-08-12 MED ORDER — ONDANSETRON HCL 4 MG/2ML IJ SOLN
INTRAMUSCULAR | Status: AC
  Filled 2023-08-12: qty 2

## 2023-08-12 MED ORDER — FENTANYL CITRATE PF 50 MCG/ML IJ SOSY
PREFILLED_SYRINGE | INTRAMUSCULAR | Status: AC
  Filled 2023-08-12: qty 1

## 2023-08-12 MED ORDER — CEFAZOLIN IN SODIUM CHLORIDE 3-0.9 GM/100ML-% IV SOLN
3.0000 g | Freq: Once | INTRAVENOUS | Status: AC
  Administered 2023-08-12: 3 g via INTRAVENOUS

## 2023-08-12 MED ORDER — GABAPENTIN 300 MG PO CAPS
600.0000 mg | ORAL_CAPSULE | Freq: Three times a day (TID) | ORAL | Status: DC
  Administered 2023-08-12 – 2023-08-13 (×3): 600 mg via ORAL
  Filled 2023-08-12 (×3): qty 2

## 2023-08-12 MED ORDER — KETAMINE HCL 10 MG/ML IJ SOLN
INTRAMUSCULAR | Status: DC | PRN
  Administered 2023-08-12: 20 mg via INTRAVENOUS

## 2023-08-12 MED ORDER — KETOROLAC TROMETHAMINE 30 MG/ML IJ SOLN
INTRAMUSCULAR | Status: AC
Start: 2023-08-12 — End: ?
  Filled 2023-08-12: qty 1

## 2023-08-12 MED ORDER — ZOLPIDEM TARTRATE 5 MG PO TABS
10.0000 mg | ORAL_TABLET | Freq: Every evening | ORAL | Status: DC | PRN
  Administered 2023-08-12: 10 mg via ORAL
  Filled 2023-08-12: qty 2

## 2023-08-12 MED ORDER — ESMOLOL HCL 100 MG/10ML IV SOLN
INTRAVENOUS | Status: DC | PRN
  Administered 2023-08-12: 20 mg via INTRAVENOUS
  Administered 2023-08-12: 10 mg via INTRAVENOUS
  Administered 2023-08-12: 15 mg via INTRAVENOUS

## 2023-08-12 MED ORDER — CEFAZOLIN SODIUM 1 G IJ SOLR
INTRAMUSCULAR | Status: AC
Start: 2023-08-12 — End: ?
  Filled 2023-08-12: qty 10

## 2023-08-12 MED ORDER — DIAZEPAM 2 MG PO TABS
5.0000 mg | ORAL_TABLET | Freq: Two times a day (BID) | ORAL | Status: DC
  Administered 2023-08-12 – 2023-08-13 (×2): 5 mg via ORAL
  Filled 2023-08-12 (×2): qty 3

## 2023-08-12 MED ORDER — MIDAZOLAM HCL 2 MG/2ML IJ SOLN
INTRAMUSCULAR | Status: AC
  Filled 2023-08-12: qty 2

## 2023-08-12 MED ORDER — FINASTERIDE 5 MG PO TABS
5.0000 mg | ORAL_TABLET | Freq: Every day | ORAL | Status: DC
  Administered 2023-08-13: 5 mg via ORAL
  Filled 2023-08-12: qty 1

## 2023-08-12 MED ORDER — FENTANYL CITRATE (PF) 100 MCG/2ML IJ SOLN
INTRAMUSCULAR | Status: DC | PRN
  Administered 2023-08-12 (×4): 50 ug via INTRAVENOUS
  Administered 2023-08-12: 100 ug via INTRAVENOUS
  Administered 2023-08-12: 50 ug via INTRAVENOUS

## 2023-08-12 MED ORDER — MIDAZOLAM HCL 5 MG/5ML IJ SOLN
INTRAMUSCULAR | Status: DC | PRN
  Administered 2023-08-12: 2 mg via INTRAVENOUS

## 2023-08-12 MED ORDER — PROPOFOL 10 MG/ML IV BOLUS
INTRAVENOUS | Status: DC | PRN
  Administered 2023-08-12: 180 mg via INTRAVENOUS

## 2023-08-12 MED ORDER — LACTATED RINGERS IV SOLN: INTRAVENOUS | Status: DC | PRN

## 2023-08-12 MED ORDER — OXYCODONE HCL 5 MG PO TABS
5.0000 mg | ORAL_TABLET | ORAL | Status: DC | PRN
  Administered 2023-08-12 – 2023-08-13 (×2): 5 mg via ORAL
  Filled 2023-08-12: qty 1

## 2023-08-12 MED ORDER — TOPIRAMATE 25 MG PO TABS
25.0000 mg | ORAL_TABLET | Freq: Every day | ORAL | Status: DC
  Administered 2023-08-12: 25 mg via ORAL
  Filled 2023-08-12: qty 1

## 2023-08-12 MED ORDER — ACETAMINOPHEN 500 MG PO TABS
1000.0000 mg | ORAL_TABLET | Freq: Four times a day (QID) | ORAL | Status: AC
  Administered 2023-08-12 – 2023-08-13 (×4): 1000 mg via ORAL
  Filled 2023-08-12 (×4): qty 2

## 2023-08-12 MED ORDER — ONDANSETRON HCL 4 MG/2ML IJ SOLN
INTRAMUSCULAR | Status: DC | PRN
  Administered 2023-08-12: 4 mg via INTRAVENOUS

## 2023-08-12 MED ORDER — ROCURONIUM BROMIDE 100 MG/10ML IV SOLN
INTRAVENOUS | Status: DC | PRN
  Administered 2023-08-12: 60 mg via INTRAVENOUS

## 2023-08-12 SURGICAL SUPPLY — 24 items
BAG DRN RND TRDRP ANRFLXCHMBR (UROLOGICAL SUPPLIES) ×1
BAG URINE DRAIN 2000ML AR STRL (UROLOGICAL SUPPLIES) ×1 IMPLANT
BAG URO CATCHER STRL LF (MISCELLANEOUS) ×1 IMPLANT
CATH FOLEY 3WAY 30CC 24FR (CATHETERS) ×1
CATH URTH STD 24FR FL 3W 2 (CATHETERS) IMPLANT
DRAPE FOOT SWITCH (DRAPES) ×1 IMPLANT
ELECT REM PT RETURN 15FT ADLT (MISCELLANEOUS) IMPLANT
GLOVE SURG LX STRL 7.5 STRW (GLOVE) ×1 IMPLANT
GOWN STRL REUS W/ TWL XL LVL3 (GOWN DISPOSABLE) ×1 IMPLANT
GOWN STRL REUS W/TWL XL LVL3 (GOWN DISPOSABLE) ×1
GUIDEWIRE STR DUAL SENSOR (WIRE) IMPLANT
HOLDER FOLEY CATH W/STRAP (MISCELLANEOUS) IMPLANT
IV CATH AUTO 14GX1.75 SAFE ORG (IV SOLUTION) IMPLANT
KIT TURNOVER KIT A (KITS) IMPLANT
LOOP CUT BIPOLAR 24F LRG (ELECTROSURGICAL) IMPLANT
MANIFOLD NEPTUNE II (INSTRUMENTS) ×1 IMPLANT
PACK CYSTO (CUSTOM PROCEDURE TRAY) ×1 IMPLANT
PAD PREP 24X48 CUFFED NSTRL (MISCELLANEOUS) ×1 IMPLANT
SYR 30ML LL (SYRINGE) ×1 IMPLANT
SYR TOOMEY IRRIG 70ML (MISCELLANEOUS) ×1
SYRINGE TOOMEY IRRIG 70ML (MISCELLANEOUS) ×1 IMPLANT
TUBING CONNECTING 10 (TUBING) ×1 IMPLANT
TUBING UROLOGY SET (TUBING) ×1 IMPLANT
WATER STERILE IRR 500ML POUR (IV SOLUTION) IMPLANT

## 2023-08-12 NOTE — Brief Op Note (Signed)
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  PRE-OPERATIVE DIAGNOSIS:  BENIGN PROSTATIC HYPERPLASIA  POST-OPERATIVE DIAGNOSIS:  BENIGN PROSTATIC HYPERPLASIA  PROCEDURE:  Procedure(s) with comments: TRANSURETHRAL RESECTION OF THE PROSTATE (TURP) (N/A) - 75 MINS  SURGEON:  Surgeons and Role:    * Hiep Ollis, Delbert Phenix., MD - Primary  PHYSICIAN ASSISTANT:   ASSISTANTS: none   ANESTHESIA:   general  EBL:   BLOOD ADMINISTERED:none  DRAINS:  63F 3 way foley to NS irrigation    LOCAL MEDICATIONS USED:  NONE  SPECIMEN:  Source of Specimen:  prostate chips  DISPOSITION OF SPECIMEN:  PATHOLOGY  COUNTS:  YES  TOURNIQUET:  * No tourniquets in log *  DICTATION: .[id number]  PLAN OF CARE: PACU then Observation Admission  PATIENT DISPOSITION:  PACU - hemodynamically stable.   Delay start of Pharmacological VTE agent (>24hrs) due to surgical blood loss or risk of bleeding: yes

## 2023-08-12 NOTE — Anesthesia Procedure Notes (Signed)
Procedure Name: Intubation Date/Time: 08/12/2023 12:10 PM  Performed by: Ahmed Prima, CRNAPre-anesthesia Checklist: Patient identified, Emergency Drugs available, Suction available and Patient being monitored Patient Re-evaluated:Patient Re-evaluated prior to induction Oxygen Delivery Method: Circle system utilized Preoxygenation: Pre-oxygenation with 100% oxygen Induction Type: IV induction Ventilation: Mask ventilation without difficulty Laryngoscope Size: Glidescope, Mac and 3 Grade View: Grade I Tube type: Oral Tube size: 7.5 mm Number of attempts: 1 Airway Equipment and Method: Stylet, Oral airway and Video-laryngoscopy Placement Confirmation: ETT inserted through vocal cords under direct vision, positive ETCO2 and breath sounds checked- equal and bilateral Secured at: 23 (at the lip) cm Tube secured with: Tape Dental Injury: Teeth and Oropharynx as per pre-operative assessment

## 2023-08-12 NOTE — Transfer of Care (Signed)
Immediate Anesthesia Transfer of Care Note  Patient: Daniel Dallas, MD  Procedure(s) Performed: TRANSURETHRAL RESECTION OF THE PROSTATE (TURP)  Patient Location: PACU  Anesthesia Type:General  Level of Consciousness: awake, alert , oriented, and patient cooperative  Airway & Oxygen Therapy: Patient Spontanous Breathing and Patient connected to face mask oxygen  Post-op Assessment: Report given to RN and Post -op Vital signs reviewed and stable  Post vital signs: Reviewed and stable  Last Vitals:  Vitals Value Taken Time  BP 145/96 08/12/23 1323  Temp 36.4 C 08/12/23 1323  Pulse 99 08/12/23 1326  Resp 16 08/12/23 1326  SpO2 98 % 08/12/23 1326  Vitals shown include unfiled device data.  Last Pain:  Vitals:   08/12/23 1011  TempSrc: Oral         Complications: No notable events documented.

## 2023-08-12 NOTE — Discharge Instructions (Signed)
1 - You may have urinary urgency (bladder spasms) and bloody urine on / off for up to 3 weeks. This is normal.  2 - Call MD or go to ER for fever >102, severe pain / nausea / vomiting not relieved by medications, or acute change in medical status

## 2023-08-12 NOTE — Plan of Care (Signed)
  Problem: Education: Goal: Knowledge of General Education information will improve Description: Including pain rating scale, medication(s)/side effects and non-pharmacologic comfort measures Outcome: Progressing   Problem: Activity: Goal: Risk for activity intolerance will decrease Outcome: Progressing   Problem: Education: Goal: Knowledge of the prescribed therapeutic regimen will improve Outcome: Progressing   Problem: Bowel/Gastric: Goal: Gastrointestinal status for postoperative course will improve Outcome: Progressing   Problem: Urinary Elimination: Goal: Ability to avoid or minimize complications of infection will improve Outcome: Progressing

## 2023-08-12 NOTE — H&P (Signed)
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    Chief Complaint: Pre-Op Transurethral Resection of Prostate  HPI:   1 - Enlarged Prostate with Urinary Hesitancy / Weak Stream / Nocturia - SLowly progressive bother from mix of irritative and obstructive symptoms. Tried daily Cialis and reports good initial response, but then more bothersome over time. Also taking Tamsulosin 0.4mg  BID. No h/u GU trauma / straddle injury. Most recent Hgb A1c <6. DRE 2022 50gm; UA 2022 normal. Adding finasteride 08/2021. ??01/2023 - PVR <58mL, but LUTS bother increasing, Cysto with trilobar hypertrophy, no strictures.   ???2 - Elevated PSA - No FHX prostate cancer. He is Tree surgeon. ?2019 - PSA 1.7 ?08/2020 - PSA 3.58 ?05/2021 - PSA 6.1 / DRE 50gm smooth ===> NEGATIVE TRUS Biopsy 08/2021 82mL with median. Some L>R base hyposchoic. ? 2024 - PSA 3.3 (finasteride) ?  ??PMH sig for Rt hip replacement,TKR, DVT/Xarelto (follows med onc) DM2 (A1c <6), DJD. 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 His PCP is Jarome Matin MD with [facility].   ??Today "[name]" is seen to proceed with transurethral resection of prostate. No interval fevers. Most recent UA without infections parameters.    Past Medical History:  Diagnosis Date   BPH (benign prostatic hyperplasia)    Chronic pain syndrome    neck and low back   DDD (degenerative disc disease), cervical    Diverticulosis    DJD (degenerative joint disease)    Ganglion cyst    12 mm , right posterior knee   GERD (gastroesophageal reflux disease)    Hyperlipidemia    Internal hemorrhoids    Migraines    OA (osteoarthritis)    knees, hands, right shoulder   Pneumonia    Positive QuantiFERON-TB Gold test    Shoulder pain    Tear of right rotator cuff    Type 2 diabetes mellitus (HCC)    last A1c 5.4 on 04-13-2018 in epic (followed by pcp)   Wears glasses     Past Surgical History:  Procedure Laterality Date   ANAL  FISSURE REPAIR     ARTHOSCOPIC ROTAOR CUFF REPAIR Right 06/15/2018   Procedure: RIGHT SHOULDER ARTHROSCOPY, DEBRIDEMENT, SUBACROMIAL DECOMPRESSION, DISTAL CLAVICLE RESECTION, ROTATOR CUFF REPAIR;  Surgeon: Eugenia Mcalpine, MD;  Location: [facility];  Service: Orthopedics;  Laterality: Right;   COLONOSCOPY  05-26-2017   dr Dineen Kid SIGMOIDOSCOPY N/A 11/05/2021   Procedure: FLEXIBLE SIGMOIDOSCOPY;  Surgeon: Rachael Fee, MD;  Location: [facility];  Service: Endoscopy;  Laterality: N/A;   Hip Resurface  2006   KNEE ARTHROSCOPY W/ MENISCAL REPAIR Bilateral right 1993; left 2010   MASS EXCISION Right 04/21/2018   Procedure: EXCISION RIGHT THIGH SOFT TISSUE MASS;  Surgeon: Ollen Gross, MD;  [facility];  Service: Orthopedics;  Laterality: Right;   SEPTOPLASTY     SHOULDER ARTHROSCOPY WITH ROTATOR CUFF REPAIR Left    SLAP Tear Repair  2008   TONSILLECTOMY     TOTAL KNEE ARTHROPLASTY Left 12/08/2016   Procedure: LEFT TOTAL KNEE ARTHROPLASTY;  Surgeon: Ollen Gross, MD;  [facility];  Service: Orthopedics;  Laterality: Left;   TOTAL KNEE ARTHROPLASTY WITH REVISION COMPONENTS Left 04/21/2018   Procedure: Left knee polyethylene exchange ;  Surgeon: Ollen Gross, MD;  [facility];  Service: Orthopedics;  Laterality: Left;    Family History  Problem Relation Age of Onset   Heart attack Father 2   Social  History:  reports that he has never smoked. He has never used smokeless tobacco. He reports current alcohol use. He reports that he does not use drugs.  Allergies:  Allergies  Allergen Reactions   Benzoin Other (See Comments)    Blisters      No medications prior to admission.    No results found for this or any previous visit (from the past 48 hour(s)). No results found.  Review of Systems  Constitutional:  Negative for chills and fever.  All other systems reviewed and are negative.   There were no vitals taken for this visit. Physical  Exam Vitals reviewed.  Eyes:     Pupils: Pupils are equal, round, and reactive to light.  Cardiovascular:     Rate and Rhythm: Normal rate.  Pulmonary:     Effort: Pulmonary effort is normal.  Abdominal:     General: Abdomen is flat.  Genitourinary:    Comments: No CVAT at present Musculoskeletal:        General: Normal range of motion.     Cervical back: Normal range of motion.  Skin:    General: Skin is warm.  Neurological:     General: No focal deficit present.     Mental Status: He is alert.  Psychiatric:        Mood and Affect: Mood normal.      Assessment/Plan  Proceed as planned with transurethral resection of prostate. Risks, benefits, alternaitves, expected peri-op course discussed previously and reiterated today.   [name], MD 08/12/2023, 8:13 AM

## 2023-08-13 ENCOUNTER — Other Ambulatory Visit (HOSPITAL_COMMUNITY): Payer: Self-pay

## 2023-08-13 ENCOUNTER — Encounter (HOSPITAL_COMMUNITY): Payer: Self-pay | Admitting: Urology

## 2023-08-13 DIAGNOSIS — Z96652 Presence of left artificial knee joint: Secondary | ICD-10-CM | POA: Diagnosis not present

## 2023-08-13 DIAGNOSIS — N401 Enlarged prostate with lower urinary tract symptoms: Secondary | ICD-10-CM | POA: Diagnosis not present

## 2023-08-13 DIAGNOSIS — E119 Type 2 diabetes mellitus without complications: Secondary | ICD-10-CM | POA: Diagnosis not present

## 2023-08-13 LAB — BASIC METABOLIC PANEL
Anion gap: 6 (ref 5–15)
BUN: 12 mg/dL (ref 8–23)
CO2: 24 mmol/L (ref 22–32)
Calcium: 8.5 mg/dL — ABNORMAL LOW (ref 8.9–10.3)
Chloride: 110 mmol/L (ref 98–111)
Creatinine, Ser: 0.77 mg/dL (ref 0.61–1.24)
GFR, Estimated: >60 mL/min (ref >60)
Glucose, Bld: 137 mg/dL — ABNORMAL HIGH (ref 70–99)
Potassium: 4.1 mmol/L (ref 3.5–5.1)
Sodium: 140 mmol/L (ref 135–145)

## 2023-08-13 LAB — HEMOGLOBIN AND HEMATOCRIT, BLOOD
HCT: 40.3 % (ref 39.0–52.0)
Hemoglobin: 13.1 g/dL (ref 13.0–17.0)

## 2023-08-13 LAB — SURGICAL PATHOLOGY

## 2023-08-13 MED ORDER — SULFAMETHOXAZOLE-TRIMETHOPRIM 800-160 MG PO TABS
1.0000 | ORAL_TABLET | Freq: Every day | ORAL | 0 refills | Status: AC
  Filled 2023-08-13: qty 5, 5d supply, fill #0

## 2023-08-13 MED ORDER — HYDROMORPHONE HCL 2 MG PO TABS
2.0000 mg | ORAL_TABLET | Freq: Four times a day (QID) | ORAL | 0 refills | Status: AC | PRN
  Filled 2023-08-13: qty 15, 4d supply, fill #0

## 2023-08-13 MED ORDER — CHLORHEXIDINE GLUCONATE CLOTH 2 % EX PADS
6.0000 | MEDICATED_PAD | Freq: Every day | CUTANEOUS | Status: DC
  Administered 2023-08-13: 6 via TOPICAL

## 2023-08-13 NOTE — Plan of Care (Signed)
  Problem: Education: Goal: Knowledge of General Education information will improve Description: Including pain rating scale, medication(s)/side effects and non-pharmacologic comfort measures Outcome: Progressing   Problem: Clinical Measurements: Goal: Ability to maintain clinical measurements within normal limits will improve Outcome: Progressing Goal: Diagnostic test results will improve Outcome: Progressing   Problem: Activity: Goal: Risk for activity intolerance will decrease Outcome: Progressing   Problem: Nutrition: Goal: Adequate nutrition will be maintained Outcome: Progressing   

## 2023-08-13 NOTE — Discharge Summary (Signed)
Physician Discharge Summary  Patient ID: Daniel SERVEDIO, MD MRN: [id number] DOB/AGE: [birth year] 62 y.o.  Admit date: 08/12/2023 Discharge date: 08/13/2023  Admission Diagnoses: Prostatic Hypertrophy  Discharge Diagnoses:  Principal Problem:   Prostatic hyperplasia   Discharged Condition: good  Hospital Course: Pt underwent transurethral resection of prostate on 08/12/23 without acute complication. Admitted to 4th floor Urology service post-op for observation on bladder irrigation which was slowly weaned to off. By the afternoon of POD 1, the day of discharge, he is ambulatory, pain controlled on PO meds, catheter draining well off irrigation and felt to be adequate for discharge. Hgb 13, Cr 0.7, path pending at discharge.   Consults: None  Significant Diagnostic Studies: labs: as per above  Treatments: surgery: as per above  Discharge Exam: Blood pressure (!) 147/82, pulse 82, temperature 98.1 F (36.7 C), temperature source Oral, resp. rate 18, height 6' (1.829 m), weight 123.8 kg, SpO2 97%.  NAD, pleasant, family at bedside Non-labored breathign on RA RRR SNTND Foley in place with clear yellow urine off irrigation and off traction No c/c/e  Disposition: HOME   Allergies as of 08/13/2023       Reactions   Benzoin Rash, Other (See Comments)   Blisters (Benzoin is the sap (gum resin) that comes from cuts in the trunk of trees that belong to the [place] family.)        Medication List     STOP taking these medications    silodosin 8 MG Caps capsule Commonly known as: RAPAFLO   tamsulosin 0.4 MG Caps capsule Commonly known as: FLOMAX       TAKE these medications    Airborne Chew Chew 3 tablets by mouth daily.   baclofen 10 MG tablet Commonly known as: LIORESAL Take 1-2 tablets (10-20 mg total) by mouth 2 (two) times daily as needed.   butalbital-acetaminophen-caffeine 50-325-40 MG tablet Commonly known as: FIORICET Take 1-2 tablets by mouth every  4-6 hours as needed (max 6 tabs in 24 hours)   cyclobenzaprine 10 MG tablet Commonly known as: FLEXERIL Take 1 tablet (10 mg total) by mouth 3 (three) times daily as needed.   cyclobenzaprine 10 MG tablet Commonly known as: FLEXERIL Take 1 tablet (10 mg total) by mouth 3 (three) times daily as needed.   diazepam 5 MG tablet Commonly known as: VALIUM Take 1 tablet (5 mg total) by mouth 2 (two) times daily.   finasteride 5 MG tablet Commonly known as: PROSCAR Take 1 tablet (5 mg total) by mouth daily.   gabapentin 600 MG tablet Commonly known as: NEURONTIN Take 1 tablet (600 mg total) by mouth 3 (three) times daily.   HYDROmorphone 2 MG tablet Commonly known as: Dilaudid Take 1 tablet (2 mg total) by mouth every 6 (six) hours as needed for severe pain (pain score 7-10) or moderate pain (pain score 4-6) (post-operatively).   Metamucil 28.3 % Powd Generic drug: Psyllium Mix 3 teaspoonsful of powder into the appropriate amount of water and drink once a day. What changed:  how much to take how to take this when to take this reasons to take this additional instructions   metFORMIN 1000 MG tablet Commonly known as: GLUCOPHAGE Take 1 tablet (1,000 mg total) by mouth 2 (two) times daily with a meal.   methocarbamol 500 MG tablet Commonly known as: ROBAXIN Take 1 tablet (500 mg total) by mouth 3 (three) times daily as needed for tearful muscle spasms and pain. What changed: Another medication with the same  name was changed. Make sure you understand how and when to take each.   methocarbamol 500 MG tablet Commonly known as: ROBAXIN Take 1 tablet (500 mg total) by mouth every 6 to 8 hours as needed for muscle spasms. What changed:  how much to take when to take this additional instructions   methocarbamol 500 MG tablet Commonly known as: ROBAXIN Take 1 tablet (500 mg total) by mouth 2 (two) times daily as needed. What changed: Another medication with the same name was  changed. Make sure you understand how and when to take each.   Mounjaro 10 MG/0.5ML Pen Generic drug: tirzepatide Inject 10 mg into the skin once a week.   Movantik 25 MG Tabs tablet Generic drug: naloxegol oxalate Take 1 tablet (25 mg total) by mouth every morning.   Movantik 25 MG Tabs tablet Generic drug: naloxegol oxalate Take 1 tablet (25 mg total) by mouth every morning.   Movantik 25 MG Tabs tablet Generic drug: naloxegol oxalate Take 1 tablet (25 mg total) by mouth every morning.   naloxone 4 MG/0.1ML Liqd nasal spray kit Commonly known as: Narcan Place 1 spray (4 mg total) into the nose daily as needed for respiratory arrest.   Nurtec 75 MG Tbdp Generic drug: Rimegepant Sulfate Place 1 tablet on the tongue and dissolve daily as needed   Nurtec 75 MG Tbdp Generic drug: Rimegepant Sulfate Place 1 tablet on the tongue and allow to dissolve once daily as needed.   Nurtec 75 MG Tbdp Generic drug: Rimegepant Sulfate Dissolve 1 tablet on the tongue once daily as needed   ondansetron 4 MG tablet Commonly known as: ZOFRAN Take 1 tablet (4 mg total) by mouth 3 (three) times daily. What changed:  when to take this reasons to take this   ondansetron 8 MG disintegrating tablet Commonly known as: ZOFRAN-ODT Dissolve 1 tablet in mouth every 8 hours as needed for nausea for 5 days.   OVER THE COUNTER MEDICATION Take 1 capsule by mouth 2 (two) times daily. Super Beta Prostate Supplement capsules.   oxyCODONE 5 MG immediate release tablet Commonly known as: Oxy IR/ROXICODONE Take 1-2 tablets (5-10 mg total) by mouth every 8 (eight) hours as needed for pain, caution sedation What changed: Another medication with the same name was changed. Make sure you understand how and when to take each.   oxyCODONE 5 MG immediate release tablet Commonly known as: Oxy IR/ROXICODONE Take 1-2 tablets (5-10 mg total) by mouth every 8 (eight) hours as needed for pain. May cause  drowsiness. What changed:  when to take this additional instructions   oxymetazoline 0.05 % nasal spray Commonly known as: AFRIN Place 2 sprays into both nostrils 2 (two) times daily as needed for congestion.   pantoprazole 40 MG tablet Commonly known as: PROTONIX Take 1 tablet (40 mg total) by mouth daily. What changed: Another medication with the same name was changed. Make sure you understand how and when to take each.   pantoprazole 40 MG tablet Commonly known as: PROTONIX Take 1 tablet (40 mg total) by mouth daily. What changed: Another medication with the same name was changed. Make sure you understand how and when to take each.   pantoprazole 40 MG tablet Commonly known as: PROTONIX Take 1 tablet (40 mg total) by mouth daily. What changed:  when to take this reasons to take this   polyethylene glycol 17 g packet Commonly known as: MIRALAX / GLYCOLAX Take 17 g by mouth daily. What changed:  when to take this reasons to take this   rizatriptan 5 MG disintegrating tablet Commonly known as: MAXALT-MLT DISSOLVE ONE TABLET BY MOUTH AT ONSET OF HEADACHE MAY REPEAT IN 2 HOURS (MAX 6 TABLETS PER DAY)   rosuvastatin 20 MG tablet Commonly known as: CRESTOR Take 1 tablet (20 mg total) by mouth daily.   rosuvastatin 20 MG tablet Commonly known as: CRESTOR Take 1 tablet (20 mg total) by mouth daily.   senna 8.6 MG Tabs tablet Commonly known as: SENOKOT Take 1-2 tablets (8.6-17.2 mg total) by mouth daily as needed for mild constipation.   Spirometer Kit Use 3 times daily   sulfamethoxazole-trimethoprim 800-160 MG tablet Commonly known as: BACTRIM DS Take 1 tablet by mouth daily. To prevent infection with catheter in place   tadalafil 5 MG tablet Commonly known as: CIALIS Take 1 tablet (5 mg total) by mouth daily for benign prostatic hypertrophy   tadalafil 5 MG tablet Commonly known as: CIALIS TAKE 1 TABLET BY MOUTH ONCE DAILY FOR BENIGN PROSTATIC HYPERTROPHY    topiramate 25 MG tablet Commonly known as: TOPAMAX Take 1 tablet (25 mg total) by mouth at bedtime..(to prevent headache)   topiramate 25 MG tablet Commonly known as: TOPAMAX Take 1 tablet (25 mg total) by mouth at bedtime to prevent headaches.   topiramate 25 MG tablet Commonly known as: TOPAMAX Take 1 tablet (25 mg total) by mouth at bedtime (to prevent headache)   traMADol 50 MG tablet Commonly known as: ULTRAM Take 1-2 tablets (50-100 mg total) by mouth every 6 (six) hours as needed for pain, not to exceed 8 tablets per day   traMADol 50 MG tablet Commonly known as: ULTRAM Take 1-2 tablets (50-100 mg total) by mouth every 6 (six) hours as needed for pain. Max of 8 tablets per day.   Voltaren 1 % Gel Generic drug: diclofenac Sodium Apply 2-4 g topically See admin instructions. Apply 2-4 grams to affected area 4 times a day   Xarelto 20 MG Tabs tablet Generic drug: rivaroxaban Take 1 tablet (20 mg total) by mouth daily with food   zolpidem 10 MG tablet Commonly known as: Ambien Take 1 tablet (10 mg total) by mouth at bedtime as needed for sleep        Follow-up Information     [name], MD Follow up on 08/17/2023.   Specialty: Urology Why: at 11AM for MD visit and office catheter removal. Contact information: [address] [place] [address] [phone]                 Signed: [name] 08/13/2023, 1:34 PM

## 2023-08-13 NOTE — Progress Notes (Signed)
Patient to discharge to home today. Dr. Berneice Heinrich updated regarding discharge AVS and Medications that are on multiple times on the AVS. Patient can be discharged to home with current AVS.

## 2023-08-13 NOTE — Anesthesia Postprocedure Evaluation (Addendum)
Anesthesia Post Note  Patient: Daniel Dallas, MD  Procedure(s) Performed: TRANSURETHRAL RESECTION OF THE PROSTATE (TURP)     Patient location during evaluation: PACU Anesthesia Type: General Level of consciousness: awake and alert Pain management: pain level controlled Vital Signs Assessment: post-procedure vital signs reviewed and stable Respiratory status: spontaneous breathing, nonlabored ventilation and respiratory function stable Cardiovascular status: blood pressure returned to baseline and stable Postop Assessment: no apparent nausea or vomiting Anesthetic complications: no   No notable events documented.  Last Vitals:    Last Pain:                 Collene Schlichter

## 2023-08-13 NOTE — TOC CM/SW Note (Signed)
Transition of Care [facility]) - Inpatient Brief Assessment   Patient Details  Name: Daniel UPSHUR, MD MRN: [id number] Date of Birth: [birth year]  Transition of Care [facility]) CM/SW Contact:    Howell Rucks, RN Phone Number: 08/13/2023, 8:26 AM   Clinical Narrative: met with pt and spouse at  bedside to introduce role of TOC/NCM and review for dc planning, pt reports he has an established PCP and pharmacy, no current home care services or home DME, reports he feels safe returning home with support from his spouse, confirms spouse to provide transportation at discharge. TOC Brief Assessment completed. No TOC needs identified at this time.     Transition of Care Asessment: Insurance and Status: Insurance coverage has been reviewed   Home environment has been reviewed: resides in private residence with spouse Prior level of function:: Independent Prior/Current Home Services: No current home services Social Determinants of Health Reivew: SDOH reviewed no interventions necessary Readmission risk has been reviewed: Yes Transition of care needs: no transition of care needs at this time

## 2023-08-13 NOTE — Progress Notes (Signed)
Patient to be discharged to home this afternoon. All discharge instructions including all discharge Medications and schedule for these Medications reviewed with the Patient and Patient's Wife. Foley and leg bag teaching done with the Patient and Wife. Understanding verbalized and discharge AVS with the Patient at time of discharge. Medications were delivered from outpatient Pharmacy and given to the Patient

## 2023-08-17 ENCOUNTER — Other Ambulatory Visit (HOSPITAL_COMMUNITY): Payer: Self-pay

## 2023-08-28 ENCOUNTER — Other Ambulatory Visit (HOSPITAL_COMMUNITY): Payer: Self-pay

## 2023-08-28 MED ORDER — CIPROFLOXACIN HCL 500 MG PO TABS
500.0000 mg | ORAL_TABLET | Freq: Two times a day (BID) | ORAL | 0 refills | Status: AC
  Filled 2023-08-28: qty 8, 4d supply, fill #0
  Filled 2023-08-28: qty 20, 10d supply, fill #0

## 2023-08-28 MED ORDER — AMOXICILLIN-POT CLAVULANATE 500-125 MG PO TABS
1.0000 | ORAL_TABLET | Freq: Two times a day (BID) | ORAL | 0 refills | Status: AC
  Filled 2023-08-28: qty 14, 7d supply, fill #0

## 2023-08-29 ENCOUNTER — Other Ambulatory Visit (HOSPITAL_COMMUNITY): Payer: Self-pay

## 2023-09-08 DIAGNOSIS — M9904 Segmental and somatic dysfunction of sacral region: Secondary | ICD-10-CM | POA: Diagnosis not present

## 2023-09-08 DIAGNOSIS — M9902 Segmental and somatic dysfunction of thoracic region: Secondary | ICD-10-CM | POA: Diagnosis not present

## 2023-09-08 DIAGNOSIS — M9903 Segmental and somatic dysfunction of lumbar region: Secondary | ICD-10-CM | POA: Diagnosis not present

## 2023-09-08 DIAGNOSIS — M9901 Segmental and somatic dysfunction of cervical region: Secondary | ICD-10-CM | POA: Diagnosis not present

## 2023-09-25 ENCOUNTER — Encounter: Payer: Self-pay | Admitting: Physical Medicine & Rehabilitation

## 2023-09-25 ENCOUNTER — Encounter: Payer: 59 | Attending: Physical Medicine & Rehabilitation | Admitting: Physical Medicine & Rehabilitation

## 2023-09-25 VITALS — BP 132/84 | HR 86 | Temp 98.1°F | Ht 72.0 in | Wt 271.0 lb

## 2023-09-25 DIAGNOSIS — G43719 Chronic migraine without aura, intractable, without status migrainosus: Secondary | ICD-10-CM | POA: Diagnosis not present

## 2023-09-25 MED ORDER — ONABOTULINUMTOXINA 100 UNITS IJ SOLR
200.0000 [IU] | Freq: Once | INTRAMUSCULAR | Status: AC
  Administered 2023-09-30: 200 [IU] via INTRAMUSCULAR

## 2023-09-25 NOTE — Progress Notes (Signed)
Botox injection for chronic migraine prophylaxis.  Indication: History of migraines with greater than 15 headaches per month despite trials of oral medications.  Informed consent was obtained after discussing risks and benefits of the procedure with the patient this included bleeding bruising infection as well as facial drooping, eyelid lag, systemic effects of Botox. Patient elects to proceed and has given written consent.  Last Botox August 2021, HA freq increased in the last several months  Patient placed in a seated position Dilution 50 units per cc  Muscles injected with dose  Procerus 5 units Corrugator 5 units bilateral Frontalis 5 units 2 injection sites on the right and 2 injection sites on the left Temporalis 5 units in 4 injection sites on the right and 4 injection sites on the left Occipitalis 5 units into 3 injections sites on the right and 3 injection sites on the left  Cervical paraspinals 5 units into 2 injection sites on the right and 2 injection sites on the left trapezius 5 units into 3 injection sites on the right and 3 injection sites on the left Patient tolerated procedure well. Post procedure instructions given.  Right L5 Paraspinal 25U Right levator scapula, 25 units  pt will call to arrange next appt

## 2023-10-22 DIAGNOSIS — M9901 Segmental and somatic dysfunction of cervical region: Secondary | ICD-10-CM | POA: Diagnosis not present

## 2023-10-22 DIAGNOSIS — M9903 Segmental and somatic dysfunction of lumbar region: Secondary | ICD-10-CM | POA: Diagnosis not present

## 2023-10-22 DIAGNOSIS — M9904 Segmental and somatic dysfunction of sacral region: Secondary | ICD-10-CM | POA: Diagnosis not present

## 2023-10-22 DIAGNOSIS — M9902 Segmental and somatic dysfunction of thoracic region: Secondary | ICD-10-CM | POA: Diagnosis not present

## 2023-11-04 DIAGNOSIS — M9903 Segmental and somatic dysfunction of lumbar region: Secondary | ICD-10-CM | POA: Diagnosis not present

## 2023-11-04 DIAGNOSIS — M9902 Segmental and somatic dysfunction of thoracic region: Secondary | ICD-10-CM | POA: Diagnosis not present

## 2023-11-04 DIAGNOSIS — M9901 Segmental and somatic dysfunction of cervical region: Secondary | ICD-10-CM | POA: Diagnosis not present

## 2023-11-04 DIAGNOSIS — M9904 Segmental and somatic dysfunction of sacral region: Secondary | ICD-10-CM | POA: Diagnosis not present

## 2023-11-09 ENCOUNTER — Other Ambulatory Visit (HOSPITAL_COMMUNITY): Payer: Self-pay

## 2023-11-09 ENCOUNTER — Other Ambulatory Visit: Payer: Self-pay

## 2023-11-09 ENCOUNTER — Other Ambulatory Visit (HOSPITAL_BASED_OUTPATIENT_CLINIC_OR_DEPARTMENT_OTHER): Payer: Self-pay

## 2023-11-09 DIAGNOSIS — G894 Chronic pain syndrome: Secondary | ICD-10-CM | POA: Diagnosis not present

## 2023-11-09 DIAGNOSIS — I10 Essential (primary) hypertension: Secondary | ICD-10-CM | POA: Diagnosis not present

## 2023-11-09 DIAGNOSIS — E1151 Type 2 diabetes mellitus with diabetic peripheral angiopathy without gangrene: Secondary | ICD-10-CM | POA: Diagnosis not present

## 2023-11-09 DIAGNOSIS — M5116 Intervertebral disc disorders with radiculopathy, lumbar region: Secondary | ICD-10-CM | POA: Diagnosis not present

## 2023-11-09 DIAGNOSIS — I739 Peripheral vascular disease, unspecified: Secondary | ICD-10-CM | POA: Diagnosis not present

## 2023-11-09 DIAGNOSIS — M170 Bilateral primary osteoarthritis of knee: Secondary | ICD-10-CM | POA: Diagnosis not present

## 2023-11-09 MED ORDER — OXYCODONE HCL 5 MG PO TABS
5.0000 mg | ORAL_TABLET | Freq: Three times a day (TID) | ORAL | 0 refills | Status: AC | PRN
  Filled 2023-11-09: qty 180, 30d supply, fill #0

## 2023-11-09 MED ORDER — TRAMADOL HCL 50 MG PO TABS
50.0000 mg | ORAL_TABLET | Freq: Four times a day (QID) | ORAL | 0 refills | Status: AC | PRN
  Filled 2023-11-09: qty 240, 30d supply, fill #0

## 2023-11-09 MED ORDER — BACLOFEN 10 MG PO TABS
10.0000 mg | ORAL_TABLET | Freq: Two times a day (BID) | ORAL | 1 refills | Status: AC | PRN
  Filled 2023-11-09: qty 360, 90d supply, fill #0
  Filled 2024-02-12: qty 360, 90d supply, fill #1

## 2023-11-09 MED ORDER — DIAZEPAM 5 MG PO TABS
5.0000 mg | ORAL_TABLET | Freq: Two times a day (BID) | ORAL | 0 refills | Status: AC
  Filled 2023-11-09: qty 60, 30d supply, fill #0

## 2023-11-09 MED ORDER — LIDOCAINE 5 % EX PTCH
MEDICATED_PATCH | CUTANEOUS | 3 refills | Status: DC
  Filled 2023-11-09: qty 90, 45d supply, fill #0
  Filled 2023-12-24: qty 90, 45d supply, fill #1
  Filled 2024-02-08: qty 90, 45d supply, fill #2
  Filled 2024-03-23 – 2024-04-19 (×3): qty 90, 45d supply, fill #3

## 2023-11-09 MED ORDER — MOUNJARO 10 MG/0.5ML ~~LOC~~ SOAJ
10.0000 mg | SUBCUTANEOUS | 1 refills | Status: AC
  Filled 2023-11-09: qty 6, 84d supply, fill #0
  Filled 2024-04-30 – 2024-05-11 (×2): qty 6, 84d supply, fill #1
  Filled 2024-08-02: qty 6, 84d supply, fill #2
  Filled 2024-10-25: qty 6, 84d supply, fill #3

## 2023-11-09 MED ORDER — LIDOCAINE 4 % EX CREA
TOPICAL_CREAM | CUTANEOUS | 3 refills | Status: AC
  Filled 2023-11-09: qty 30, 14d supply, fill #0
  Filled 2023-11-23: qty 30, 14d supply, fill #1
  Filled 2023-12-07: qty 30, 14d supply, fill #2
  Filled 2023-12-23: qty 30, 14d supply, fill #3

## 2023-11-09 MED ORDER — RIZATRIPTAN BENZOATE 5 MG PO TBDP
ORAL_TABLET | ORAL | 3 refills | Status: DC
  Filled 2023-11-09: qty 15, 30d supply, fill #0
  Filled 2024-03-08: qty 15, 30d supply, fill #1
  Filled 2024-04-08: qty 15, 30d supply, fill #2
  Filled 2024-05-14: qty 15, 30d supply, fill #3

## 2023-11-09 MED ORDER — DICLOFENAC SODIUM 1 % EX GEL
CUTANEOUS | 5 refills | Status: DC
  Filled 2023-11-09: qty 100, 30d supply, fill #0
  Filled 2023-12-09: qty 100, 30d supply, fill #1
  Filled 2024-01-08 – 2024-01-09 (×2): qty 100, 30d supply, fill #2
  Filled 2024-02-08: qty 100, 30d supply, fill #3
  Filled 2024-03-08: qty 100, 30d supply, fill #4
  Filled 2024-04-08: qty 100, 30d supply, fill #5

## 2023-11-09 MED ORDER — CYCLOBENZAPRINE HCL 10 MG PO TABS
10.0000 mg | ORAL_TABLET | Freq: Three times a day (TID) | ORAL | 1 refills | Status: AC | PRN
  Filled 2023-11-09: qty 270, 90d supply, fill #0
  Filled 2024-02-12: qty 270, 90d supply, fill #1

## 2023-11-09 MED ORDER — METHOCARBAMOL 500 MG PO TABS
500.0000 mg | ORAL_TABLET | Freq: Three times a day (TID) | ORAL | 1 refills | Status: AC | PRN
  Filled 2023-11-09 – 2024-02-16 (×2): qty 90, 30d supply, fill #0
  Filled 2024-03-15: qty 90, 30d supply, fill #1

## 2023-11-09 MED ORDER — ZOLPIDEM TARTRATE 10 MG PO TABS
10.0000 mg | ORAL_TABLET | Freq: Every evening | ORAL | 0 refills | Status: AC | PRN
  Filled 2023-11-09: qty 30, 30d supply, fill #0

## 2023-11-09 MED ORDER — HYDROCORTISONE ACETATE 25 MG RE SUPP
RECTAL | 3 refills | Status: AC
  Filled 2023-11-09: qty 20, 30d supply, fill #0
  Filled 2023-12-09: qty 20, 30d supply, fill #1
  Filled 2024-01-08 – 2024-01-09 (×2): qty 20, 30d supply, fill #2
  Filled 2024-02-08: qty 20, 30d supply, fill #3

## 2023-11-10 ENCOUNTER — Other Ambulatory Visit: Payer: Self-pay

## 2023-11-10 ENCOUNTER — Other Ambulatory Visit (HOSPITAL_COMMUNITY): Payer: Self-pay

## 2023-11-10 MED ORDER — BUTALBITAL-APAP-CAFFEINE 50-325-40 MG PO TABS
ORAL_TABLET | ORAL | 0 refills | Status: AC
  Filled 2023-11-10: qty 180, 30d supply, fill #0

## 2023-11-11 ENCOUNTER — Other Ambulatory Visit (HOSPITAL_COMMUNITY): Payer: Self-pay

## 2023-11-12 ENCOUNTER — Other Ambulatory Visit (HOSPITAL_COMMUNITY): Payer: Self-pay

## 2023-11-13 ENCOUNTER — Other Ambulatory Visit (HOSPITAL_COMMUNITY): Payer: Self-pay

## 2023-11-13 MED ORDER — HYDROMORPHONE HCL 4 MG PO TABS
4.0000 mg | ORAL_TABLET | Freq: Four times a day (QID) | ORAL | 0 refills | Status: DC | PRN
  Filled 2023-11-13: qty 14, 4d supply, fill #0

## 2023-11-16 ENCOUNTER — Other Ambulatory Visit: Payer: Self-pay

## 2023-11-16 ENCOUNTER — Other Ambulatory Visit (HOSPITAL_COMMUNITY): Payer: Self-pay

## 2023-11-16 DIAGNOSIS — R3911 Hesitancy of micturition: Secondary | ICD-10-CM | POA: Diagnosis not present

## 2023-11-16 DIAGNOSIS — R972 Elevated prostate specific antigen [PSA]: Secondary | ICD-10-CM | POA: Diagnosis not present

## 2023-11-16 DIAGNOSIS — N5201 Erectile dysfunction due to arterial insufficiency: Secondary | ICD-10-CM | POA: Diagnosis not present

## 2023-11-16 MED ORDER — FINASTERIDE 5 MG PO TABS
5.0000 mg | ORAL_TABLET | Freq: Every day | ORAL | 3 refills | Status: AC
  Filled 2023-11-16: qty 90, 90d supply, fill #0

## 2023-11-16 MED ORDER — TADALAFIL 5 MG PO TABS
5.0000 mg | ORAL_TABLET | Freq: Every day | ORAL | 3 refills | Status: AC | PRN
  Filled 2023-11-16: qty 120, 90d supply, fill #0
  Filled 2024-02-12: qty 120, 90d supply, fill #1

## 2023-11-17 ENCOUNTER — Other Ambulatory Visit (HOSPITAL_COMMUNITY): Payer: Self-pay

## 2023-11-18 ENCOUNTER — Other Ambulatory Visit (HOSPITAL_COMMUNITY): Payer: Self-pay

## 2023-11-19 ENCOUNTER — Other Ambulatory Visit (HOSPITAL_COMMUNITY): Payer: Self-pay

## 2023-11-20 ENCOUNTER — Other Ambulatory Visit: Payer: Self-pay

## 2023-11-20 ENCOUNTER — Other Ambulatory Visit (HOSPITAL_COMMUNITY): Payer: Self-pay

## 2023-11-21 ENCOUNTER — Other Ambulatory Visit (HOSPITAL_COMMUNITY): Payer: Self-pay

## 2023-11-23 ENCOUNTER — Other Ambulatory Visit (HOSPITAL_COMMUNITY): Payer: Self-pay

## 2023-11-23 ENCOUNTER — Other Ambulatory Visit: Payer: Self-pay

## 2023-11-23 DIAGNOSIS — M9902 Segmental and somatic dysfunction of thoracic region: Secondary | ICD-10-CM | POA: Diagnosis not present

## 2023-11-23 DIAGNOSIS — M9904 Segmental and somatic dysfunction of sacral region: Secondary | ICD-10-CM | POA: Diagnosis not present

## 2023-11-23 DIAGNOSIS — M9901 Segmental and somatic dysfunction of cervical region: Secondary | ICD-10-CM | POA: Diagnosis not present

## 2023-11-23 DIAGNOSIS — M9903 Segmental and somatic dysfunction of lumbar region: Secondary | ICD-10-CM | POA: Diagnosis not present

## 2023-11-24 ENCOUNTER — Other Ambulatory Visit: Payer: Self-pay

## 2023-11-26 ENCOUNTER — Other Ambulatory Visit (HOSPITAL_COMMUNITY): Payer: Self-pay

## 2023-12-03 ENCOUNTER — Other Ambulatory Visit (HOSPITAL_COMMUNITY): Payer: Self-pay

## 2023-12-07 ENCOUNTER — Other Ambulatory Visit: Payer: Self-pay

## 2023-12-07 ENCOUNTER — Other Ambulatory Visit (HOSPITAL_COMMUNITY): Payer: Self-pay

## 2023-12-08 ENCOUNTER — Other Ambulatory Visit (HOSPITAL_COMMUNITY): Payer: Self-pay

## 2023-12-09 ENCOUNTER — Other Ambulatory Visit: Payer: Self-pay

## 2023-12-09 ENCOUNTER — Other Ambulatory Visit (HOSPITAL_COMMUNITY): Payer: Self-pay

## 2023-12-15 ENCOUNTER — Other Ambulatory Visit (HOSPITAL_COMMUNITY): Payer: Self-pay

## 2023-12-16 DIAGNOSIS — M9904 Segmental and somatic dysfunction of sacral region: Secondary | ICD-10-CM | POA: Diagnosis not present

## 2023-12-16 DIAGNOSIS — M9901 Segmental and somatic dysfunction of cervical region: Secondary | ICD-10-CM | POA: Diagnosis not present

## 2023-12-16 DIAGNOSIS — M9902 Segmental and somatic dysfunction of thoracic region: Secondary | ICD-10-CM | POA: Diagnosis not present

## 2023-12-16 DIAGNOSIS — M9903 Segmental and somatic dysfunction of lumbar region: Secondary | ICD-10-CM | POA: Diagnosis not present

## 2023-12-28 ENCOUNTER — Other Ambulatory Visit (HOSPITAL_COMMUNITY): Payer: Self-pay

## 2023-12-28 MED ORDER — ZOLPIDEM TARTRATE 10 MG PO TABS
ORAL_TABLET | ORAL | 0 refills | Status: AC
  Filled 2023-12-28: qty 30, 30d supply, fill #0

## 2023-12-31 ENCOUNTER — Other Ambulatory Visit (HOSPITAL_COMMUNITY): Payer: Self-pay

## 2024-01-06 ENCOUNTER — Other Ambulatory Visit (HOSPITAL_COMMUNITY): Payer: Self-pay

## 2024-01-06 MED ORDER — AMOXICILLIN-POT CLAVULANATE 875-125 MG PO TABS
1.0000 | ORAL_TABLET | Freq: Two times a day (BID) | ORAL | 0 refills | Status: AC
  Filled 2024-01-06: qty 14, 7d supply, fill #0

## 2024-01-07 ENCOUNTER — Other Ambulatory Visit (HOSPITAL_COMMUNITY): Payer: Self-pay

## 2024-01-08 ENCOUNTER — Other Ambulatory Visit (HOSPITAL_COMMUNITY): Payer: Self-pay

## 2024-01-09 ENCOUNTER — Other Ambulatory Visit (HOSPITAL_COMMUNITY): Payer: Self-pay

## 2024-01-11 ENCOUNTER — Other Ambulatory Visit: Payer: Self-pay

## 2024-01-12 ENCOUNTER — Other Ambulatory Visit (HOSPITAL_COMMUNITY): Payer: Self-pay

## 2024-01-21 DIAGNOSIS — M9902 Segmental and somatic dysfunction of thoracic region: Secondary | ICD-10-CM | POA: Diagnosis not present

## 2024-01-21 DIAGNOSIS — M9901 Segmental and somatic dysfunction of cervical region: Secondary | ICD-10-CM | POA: Diagnosis not present

## 2024-01-21 DIAGNOSIS — M9904 Segmental and somatic dysfunction of sacral region: Secondary | ICD-10-CM | POA: Diagnosis not present

## 2024-01-21 DIAGNOSIS — M9903 Segmental and somatic dysfunction of lumbar region: Secondary | ICD-10-CM | POA: Diagnosis not present

## 2024-02-08 ENCOUNTER — Other Ambulatory Visit (HOSPITAL_COMMUNITY): Payer: Self-pay

## 2024-02-08 ENCOUNTER — Other Ambulatory Visit: Payer: Self-pay

## 2024-02-08 MED ORDER — HYDROMORPHONE HCL 4 MG PO TABS
4.0000 mg | ORAL_TABLET | Freq: Four times a day (QID) | ORAL | 0 refills | Status: AC | PRN
  Filled 2024-02-08: qty 14, 4d supply, fill #0

## 2024-02-08 MED ORDER — PENICILLIN V POTASSIUM 500 MG PO TABS
500.0000 mg | ORAL_TABLET | Freq: Four times a day (QID) | ORAL | 0 refills | Status: AC
  Filled 2024-02-08: qty 28, 7d supply, fill #0

## 2024-02-09 ENCOUNTER — Other Ambulatory Visit (HOSPITAL_COMMUNITY): Payer: Self-pay

## 2024-02-09 MED ORDER — ROSUVASTATIN CALCIUM 20 MG PO TABS
20.0000 mg | ORAL_TABLET | Freq: Every day | ORAL | 3 refills | Status: AC
  Filled 2024-02-09: qty 90, 90d supply, fill #0

## 2024-02-09 MED ORDER — GABAPENTIN 600 MG PO TABS
600.0000 mg | ORAL_TABLET | Freq: Three times a day (TID) | ORAL | 3 refills | Status: AC
  Filled 2024-02-09: qty 270, 90d supply, fill #0
  Filled 2024-05-07: qty 270, 90d supply, fill #1

## 2024-02-10 ENCOUNTER — Other Ambulatory Visit (HOSPITAL_COMMUNITY): Payer: Self-pay

## 2024-02-11 ENCOUNTER — Other Ambulatory Visit (HOSPITAL_COMMUNITY): Payer: Self-pay

## 2024-02-11 MED ORDER — ZOLPIDEM TARTRATE 10 MG PO TABS
10.0000 mg | ORAL_TABLET | Freq: Every evening | ORAL | 0 refills | Status: AC | PRN
  Filled 2024-02-11: qty 90, 90d supply, fill #0

## 2024-02-12 ENCOUNTER — Other Ambulatory Visit (HOSPITAL_COMMUNITY): Payer: Self-pay

## 2024-02-16 ENCOUNTER — Other Ambulatory Visit (HOSPITAL_COMMUNITY): Payer: Self-pay

## 2024-02-17 ENCOUNTER — Other Ambulatory Visit (HOSPITAL_COMMUNITY): Payer: Self-pay

## 2024-02-17 ENCOUNTER — Other Ambulatory Visit: Payer: Self-pay

## 2024-02-17 DIAGNOSIS — M9903 Segmental and somatic dysfunction of lumbar region: Secondary | ICD-10-CM | POA: Diagnosis not present

## 2024-02-17 DIAGNOSIS — M9901 Segmental and somatic dysfunction of cervical region: Secondary | ICD-10-CM | POA: Diagnosis not present

## 2024-02-17 DIAGNOSIS — M9904 Segmental and somatic dysfunction of sacral region: Secondary | ICD-10-CM | POA: Diagnosis not present

## 2024-02-17 DIAGNOSIS — M9902 Segmental and somatic dysfunction of thoracic region: Secondary | ICD-10-CM | POA: Diagnosis not present

## 2024-02-17 MED ORDER — DIAZEPAM 5 MG PO TABS
5.0000 mg | ORAL_TABLET | Freq: Two times a day (BID) | ORAL | 0 refills | Status: DC
  Filled 2024-02-17: qty 60, 30d supply, fill #0

## 2024-02-17 MED ORDER — NURTEC 75 MG PO TBDP
75.0000 mg | ORAL_TABLET | Freq: Every day | ORAL | 3 refills | Status: AC | PRN
  Filled 2024-02-17: qty 18, 30d supply, fill #0

## 2024-02-18 ENCOUNTER — Other Ambulatory Visit (HOSPITAL_COMMUNITY): Payer: Self-pay

## 2024-02-18 DIAGNOSIS — I739 Peripheral vascular disease, unspecified: Secondary | ICD-10-CM | POA: Diagnosis not present

## 2024-02-18 DIAGNOSIS — Z7901 Long term (current) use of anticoagulants: Secondary | ICD-10-CM | POA: Diagnosis not present

## 2024-02-18 DIAGNOSIS — F5104 Psychophysiologic insomnia: Secondary | ICD-10-CM | POA: Diagnosis not present

## 2024-02-18 DIAGNOSIS — E1151 Type 2 diabetes mellitus with diabetic peripheral angiopathy without gangrene: Secondary | ICD-10-CM | POA: Diagnosis not present

## 2024-02-18 DIAGNOSIS — M5416 Radiculopathy, lumbar region: Secondary | ICD-10-CM | POA: Diagnosis not present

## 2024-02-18 DIAGNOSIS — G894 Chronic pain syndrome: Secondary | ICD-10-CM | POA: Diagnosis not present

## 2024-02-18 DIAGNOSIS — Z86711 Personal history of pulmonary embolism: Secondary | ICD-10-CM | POA: Diagnosis not present

## 2024-02-18 MED ORDER — TRAMADOL HCL 50 MG PO TABS
50.0000 mg | ORAL_TABLET | Freq: Four times a day (QID) | ORAL | 0 refills | Status: DC | PRN
  Filled 2024-02-18: qty 240, 30d supply, fill #0

## 2024-02-18 MED ORDER — OXYCODONE HCL 5 MG PO TABS
5.0000 mg | ORAL_TABLET | Freq: Three times a day (TID) | ORAL | 0 refills | Status: AC | PRN
  Filled 2024-02-18: qty 180, 30d supply, fill #0

## 2024-02-18 MED ORDER — BUTALBITAL-APAP-CAFFEINE 50-325-40 MG PO TABS
1.0000 | ORAL_TABLET | ORAL | 0 refills | Status: AC
  Filled 2024-02-18: qty 180, 30d supply, fill #0

## 2024-02-19 ENCOUNTER — Other Ambulatory Visit (HOSPITAL_COMMUNITY): Payer: Self-pay

## 2024-02-22 ENCOUNTER — Other Ambulatory Visit (HOSPITAL_COMMUNITY): Payer: Self-pay

## 2024-02-23 ENCOUNTER — Encounter: Attending: Physical Medicine & Rehabilitation | Admitting: Physical Medicine & Rehabilitation

## 2024-02-23 ENCOUNTER — Encounter: Payer: Self-pay | Admitting: Physical Medicine & Rehabilitation

## 2024-02-23 VITALS — BP 115/76 | HR 81 | Temp 98.0°F | Ht 72.0 in | Wt 265.8 lb

## 2024-02-23 DIAGNOSIS — G43719 Chronic migraine without aura, intractable, without status migrainosus: Secondary | ICD-10-CM | POA: Insufficient documentation

## 2024-02-23 MED ORDER — SODIUM CHLORIDE (PF) 0.9 % IJ SOLN
4.0000 mL | Freq: Once | INTRAMUSCULAR | Status: AC
  Administered 2024-02-23: 4 mL

## 2024-02-23 MED ORDER — ONABOTULINUMTOXINA 100 UNITS IJ SOLR
200.0000 [IU] | Freq: Once | INTRAMUSCULAR | Status: AC
  Administered 2024-02-23: 200 [IU] via INTRAMUSCULAR

## 2024-02-23 NOTE — Progress Notes (Signed)
 Botox  injection for chronic migraine prophylaxis.  Indication: History of migraines with greater than 15 headaches per month despite trials of oral medications.  Informed consent was obtained after discussing risks and benefits of the procedure with the patient this included bleeding bruising infection as well as facial drooping, eyelid lag, systemic effects of Botox . Patient elects to proceed and has given written consent.  Last Botox  August 2021, HA freq increased in the last several months  Patient placed in a seated position Dilution 50 units per cc  Muscles injected with dose  Procerus 5 units Corrugator 5 units bilateral Frontalis 5 units 2 injection sites on the right and 2 injection sites on the left Temporalis 5 units in 4 injection sites on the right and 4 injection sites on the left Occipitalis 5 units into 3 injections sites on the right and 3 injection sites on the left  Cervical paraspinals 5 units into 2 injection sites on the right and 2 injection sites on the left trapezius 25 units into 3 injection sites on the right and 15 U 3 injection sites on the left Patient tolerated procedure well. Post procedure instructions given.  RIght longissimus capitus 15U Right rectus capitus posterior 10U

## 2024-03-09 ENCOUNTER — Other Ambulatory Visit: Payer: Self-pay

## 2024-03-15 ENCOUNTER — Other Ambulatory Visit: Payer: Self-pay

## 2024-03-15 ENCOUNTER — Other Ambulatory Visit (HOSPITAL_COMMUNITY): Payer: Self-pay

## 2024-03-17 ENCOUNTER — Ambulatory Visit: Admitting: Physical Medicine & Rehabilitation

## 2024-03-22 DIAGNOSIS — M9904 Segmental and somatic dysfunction of sacral region: Secondary | ICD-10-CM | POA: Diagnosis not present

## 2024-03-22 DIAGNOSIS — M9903 Segmental and somatic dysfunction of lumbar region: Secondary | ICD-10-CM | POA: Diagnosis not present

## 2024-03-22 DIAGNOSIS — M9901 Segmental and somatic dysfunction of cervical region: Secondary | ICD-10-CM | POA: Diagnosis not present

## 2024-03-22 DIAGNOSIS — M9902 Segmental and somatic dysfunction of thoracic region: Secondary | ICD-10-CM | POA: Diagnosis not present

## 2024-03-23 ENCOUNTER — Other Ambulatory Visit (HOSPITAL_COMMUNITY): Payer: Self-pay

## 2024-03-23 ENCOUNTER — Other Ambulatory Visit: Payer: Self-pay

## 2024-03-24 ENCOUNTER — Other Ambulatory Visit: Payer: Self-pay

## 2024-04-02 ENCOUNTER — Other Ambulatory Visit (HOSPITAL_COMMUNITY): Payer: Self-pay

## 2024-04-13 ENCOUNTER — Other Ambulatory Visit (HOSPITAL_COMMUNITY): Payer: Self-pay

## 2024-04-13 ENCOUNTER — Other Ambulatory Visit: Payer: Self-pay

## 2024-04-13 MED ORDER — AMOXICILLIN 500 MG PO CAPS
2000.0000 mg | ORAL_CAPSULE | ORAL | 0 refills | Status: AC
  Filled 2024-04-13: qty 20, 5d supply, fill #0

## 2024-04-18 ENCOUNTER — Other Ambulatory Visit (HOSPITAL_COMMUNITY): Payer: Self-pay

## 2024-04-18 MED ORDER — FLUCONAZOLE 100 MG PO TABS
100.0000 mg | ORAL_TABLET | Freq: Every day | ORAL | 0 refills | Status: AC
  Filled 2024-04-18: qty 10, 10d supply, fill #0

## 2024-04-19 ENCOUNTER — Other Ambulatory Visit (HOSPITAL_COMMUNITY): Payer: Self-pay

## 2024-04-21 DIAGNOSIS — M25512 Pain in left shoulder: Secondary | ICD-10-CM | POA: Diagnosis not present

## 2024-04-28 ENCOUNTER — Other Ambulatory Visit (HOSPITAL_COMMUNITY): Payer: Self-pay

## 2024-04-28 MED ORDER — ITRACONAZOLE 10 MG/ML PO SOLN
100.0000 mg | Freq: Two times a day (BID) | ORAL | 0 refills | Status: AC
Start: 2024-04-28 — End: 2024-05-14
  Filled 2024-04-28: qty 300, 15d supply, fill #0
  Filled 2024-04-28: qty 300, 10d supply, fill #0

## 2024-04-29 ENCOUNTER — Other Ambulatory Visit (HOSPITAL_COMMUNITY): Payer: Self-pay

## 2024-05-02 DIAGNOSIS — M9903 Segmental and somatic dysfunction of lumbar region: Secondary | ICD-10-CM | POA: Diagnosis not present

## 2024-05-02 DIAGNOSIS — M9902 Segmental and somatic dysfunction of thoracic region: Secondary | ICD-10-CM | POA: Diagnosis not present

## 2024-05-02 DIAGNOSIS — M9901 Segmental and somatic dysfunction of cervical region: Secondary | ICD-10-CM | POA: Diagnosis not present

## 2024-05-02 DIAGNOSIS — M9904 Segmental and somatic dysfunction of sacral region: Secondary | ICD-10-CM | POA: Diagnosis not present

## 2024-05-03 ENCOUNTER — Other Ambulatory Visit (HOSPITAL_COMMUNITY): Payer: Self-pay

## 2024-05-05 ENCOUNTER — Other Ambulatory Visit (HOSPITAL_COMMUNITY): Payer: Self-pay

## 2024-05-07 ENCOUNTER — Other Ambulatory Visit (HOSPITAL_COMMUNITY): Payer: Self-pay

## 2024-05-09 ENCOUNTER — Other Ambulatory Visit (HOSPITAL_COMMUNITY): Payer: Self-pay

## 2024-05-10 ENCOUNTER — Other Ambulatory Visit (HOSPITAL_COMMUNITY): Payer: Self-pay

## 2024-05-13 ENCOUNTER — Other Ambulatory Visit (HOSPITAL_COMMUNITY): Payer: Self-pay

## 2024-05-13 DIAGNOSIS — M25512 Pain in left shoulder: Secondary | ICD-10-CM | POA: Diagnosis not present

## 2024-05-16 ENCOUNTER — Other Ambulatory Visit (HOSPITAL_COMMUNITY): Payer: Self-pay

## 2024-05-16 ENCOUNTER — Other Ambulatory Visit (HOSPITAL_BASED_OUTPATIENT_CLINIC_OR_DEPARTMENT_OTHER): Payer: Self-pay

## 2024-05-16 DIAGNOSIS — G894 Chronic pain syndrome: Secondary | ICD-10-CM | POA: Diagnosis not present

## 2024-05-16 DIAGNOSIS — I10 Essential (primary) hypertension: Secondary | ICD-10-CM | POA: Diagnosis not present

## 2024-05-16 DIAGNOSIS — Z8739 Personal history of other diseases of the musculoskeletal system and connective tissue: Secondary | ICD-10-CM | POA: Diagnosis not present

## 2024-05-16 DIAGNOSIS — K219 Gastro-esophageal reflux disease without esophagitis: Secondary | ICD-10-CM | POA: Diagnosis not present

## 2024-05-16 DIAGNOSIS — R5383 Other fatigue: Secondary | ICD-10-CM | POA: Diagnosis not present

## 2024-05-16 DIAGNOSIS — I739 Peripheral vascular disease, unspecified: Secondary | ICD-10-CM | POA: Diagnosis not present

## 2024-05-16 DIAGNOSIS — R5382 Chronic fatigue, unspecified: Secondary | ICD-10-CM | POA: Diagnosis not present

## 2024-05-16 DIAGNOSIS — Z7901 Long term (current) use of anticoagulants: Secondary | ICD-10-CM | POA: Diagnosis not present

## 2024-05-16 DIAGNOSIS — E1151 Type 2 diabetes mellitus with diabetic peripheral angiopathy without gangrene: Secondary | ICD-10-CM | POA: Diagnosis not present

## 2024-05-16 DIAGNOSIS — M5416 Radiculopathy, lumbar region: Secondary | ICD-10-CM | POA: Diagnosis not present

## 2024-05-16 DIAGNOSIS — B370 Candidal stomatitis: Secondary | ICD-10-CM | POA: Diagnosis not present

## 2024-05-16 MED ORDER — NYSTATIN 100000 UNIT/ML MT SUSP
4.0000 mL | Freq: Four times a day (QID) | OROMUCOSAL | 2 refills | Status: DC
  Filled 2024-05-16: qty 224, 14d supply, fill #0
  Filled 2024-05-24 – 2024-06-01 (×4): qty 224, 14d supply, fill #1
  Filled 2024-06-10 – 2024-06-14 (×2): qty 224, 14d supply, fill #2
  Filled ????-??-??: fill #1

## 2024-05-16 MED ORDER — DIAZEPAM 5 MG PO TABS
5.0000 mg | ORAL_TABLET | Freq: Two times a day (BID) | ORAL | 0 refills | Status: DC
  Filled 2024-05-16: qty 60, 30d supply, fill #0

## 2024-05-16 MED ORDER — OXYCODONE HCL 5 MG PO TABS
5.0000 mg | ORAL_TABLET | Freq: Three times a day (TID) | ORAL | 0 refills | Status: AC
  Filled 2024-05-16: qty 180, 30d supply, fill #0

## 2024-05-16 MED ORDER — MOVANTIK 25 MG PO TABS
25.0000 mg | ORAL_TABLET | Freq: Every morning | ORAL | 3 refills | Status: AC
  Filled 2024-05-16 – 2024-06-09 (×14): qty 90, 90d supply, fill #0
  Filled 2024-09-07: qty 90, 90d supply, fill #1

## 2024-05-16 MED ORDER — ZOLPIDEM TARTRATE 10 MG PO TABS
10.0000 mg | ORAL_TABLET | Freq: Every evening | ORAL | 0 refills | Status: DC
  Filled 2024-05-16: qty 90, 90d supply, fill #0

## 2024-05-16 MED ORDER — LIDOCAINE 5 % EX PTCH
MEDICATED_PATCH | CUTANEOUS | 3 refills | Status: AC
  Filled 2024-05-16 – 2024-05-30 (×3): qty 90, 45d supply, fill #0
  Filled 2024-07-13: qty 90, 45d supply, fill #1
  Filled 2024-08-27 – 2024-09-10 (×3): qty 90, 45d supply, fill #2
  Filled 2024-10-22 – 2024-11-08 (×3): qty 90, 45d supply, fill #3

## 2024-05-16 MED ORDER — METOCLOPRAMIDE HCL 5 MG PO TABS
5.0000 mg | ORAL_TABLET | Freq: Every day | ORAL | 1 refills | Status: AC | PRN
  Filled 2024-05-16: qty 30, 30d supply, fill #0
  Filled 2024-06-30: qty 30, 30d supply, fill #1

## 2024-05-16 MED ORDER — DICLOFENAC SODIUM 1 % EX GEL
CUTANEOUS | 5 refills | Status: DC
  Filled 2024-05-16: qty 100, 33d supply, fill #0
  Filled 2024-06-13: qty 100, 33d supply, fill #1

## 2024-05-16 MED ORDER — BACLOFEN 10 MG PO TABS
ORAL_TABLET | ORAL | 1 refills | Status: AC
  Filled 2024-05-16: qty 360, 90d supply, fill #0
  Filled 2024-08-08: qty 360, 90d supply, fill #1

## 2024-05-16 MED ORDER — METHOCARBAMOL 500 MG PO TABS
ORAL_TABLET | ORAL | 1 refills | Status: AC
  Filled 2024-05-16: qty 270, 90d supply, fill #0
  Filled 2024-08-08: qty 270, 90d supply, fill #1

## 2024-05-16 MED ORDER — LIDOCAINE 4 % EX CREA
TOPICAL_CREAM | CUTANEOUS | 3 refills | Status: DC
  Filled 2024-05-16: qty 30, 14d supply, fill #0
  Filled 2024-05-31: qty 30, 14d supply, fill #1
  Filled 2024-06-14: qty 30, 14d supply, fill #2
  Filled 2024-06-28: qty 30, 14d supply, fill #3

## 2024-05-16 MED ORDER — HYDROCORTISONE ACETATE 25 MG RE SUPP
RECTAL | 3 refills | Status: DC
  Filled 2024-05-16: qty 20, 10d supply, fill #0
  Filled 2024-05-20: qty 20, 10d supply, fill #1
  Filled 2024-05-30: qty 20, 10d supply, fill #2
  Filled 2024-06-09: qty 20, 10d supply, fill #3

## 2024-05-16 MED ORDER — TRAMADOL HCL 50 MG PO TABS
50.0000 mg | ORAL_TABLET | Freq: Four times a day (QID) | ORAL | 0 refills | Status: DC | PRN
  Filled 2024-05-16: qty 240, 30d supply, fill #0

## 2024-05-16 MED ORDER — NURTEC 75 MG PO TBDP
75.0000 mg | ORAL_TABLET | Freq: Every day | ORAL | 3 refills | Status: AC | PRN
  Filled 2024-05-16: qty 18, 30d supply, fill #0

## 2024-05-16 MED ORDER — ITRACONAZOLE 10 MG/ML PO SOLN
ORAL | 0 refills | Status: DC
  Filled 2024-05-16: qty 200, 10d supply, fill #0

## 2024-05-16 MED ORDER — METHOCARBAMOL 500 MG PO TABS: ORAL_TABLET | ORAL | 1 refills | Status: AC

## 2024-05-17 ENCOUNTER — Other Ambulatory Visit: Payer: Self-pay

## 2024-05-17 ENCOUNTER — Other Ambulatory Visit (HOSPITAL_COMMUNITY): Payer: Self-pay

## 2024-05-18 ENCOUNTER — Other Ambulatory Visit (HOSPITAL_COMMUNITY): Payer: Self-pay

## 2024-05-19 ENCOUNTER — Other Ambulatory Visit (HOSPITAL_COMMUNITY): Payer: Self-pay

## 2024-05-20 ENCOUNTER — Other Ambulatory Visit (HOSPITAL_COMMUNITY): Payer: Self-pay

## 2024-05-21 ENCOUNTER — Other Ambulatory Visit (HOSPITAL_COMMUNITY): Payer: Self-pay

## 2024-05-23 ENCOUNTER — Other Ambulatory Visit (HOSPITAL_COMMUNITY): Payer: Self-pay

## 2024-05-24 ENCOUNTER — Other Ambulatory Visit (HOSPITAL_COMMUNITY): Payer: Self-pay

## 2024-05-24 DIAGNOSIS — M75112 Incomplete rotator cuff tear or rupture of left shoulder, not specified as traumatic: Secondary | ICD-10-CM | POA: Diagnosis not present

## 2024-05-25 ENCOUNTER — Other Ambulatory Visit (HOSPITAL_COMMUNITY): Payer: Self-pay

## 2024-05-26 ENCOUNTER — Other Ambulatory Visit (HOSPITAL_COMMUNITY): Payer: Self-pay

## 2024-05-30 ENCOUNTER — Other Ambulatory Visit: Payer: Self-pay

## 2024-05-30 ENCOUNTER — Other Ambulatory Visit (HOSPITAL_COMMUNITY): Payer: Self-pay

## 2024-05-30 DIAGNOSIS — M9903 Segmental and somatic dysfunction of lumbar region: Secondary | ICD-10-CM | POA: Diagnosis not present

## 2024-05-30 DIAGNOSIS — M9902 Segmental and somatic dysfunction of thoracic region: Secondary | ICD-10-CM | POA: Diagnosis not present

## 2024-05-30 DIAGNOSIS — M9901 Segmental and somatic dysfunction of cervical region: Secondary | ICD-10-CM | POA: Diagnosis not present

## 2024-05-30 DIAGNOSIS — M9904 Segmental and somatic dysfunction of sacral region: Secondary | ICD-10-CM | POA: Diagnosis not present

## 2024-05-31 ENCOUNTER — Other Ambulatory Visit (HOSPITAL_COMMUNITY): Payer: Self-pay

## 2024-06-01 ENCOUNTER — Other Ambulatory Visit (HOSPITAL_COMMUNITY): Payer: Self-pay

## 2024-06-03 ENCOUNTER — Other Ambulatory Visit (HOSPITAL_COMMUNITY): Payer: Self-pay

## 2024-06-07 ENCOUNTER — Other Ambulatory Visit (HOSPITAL_COMMUNITY): Payer: Self-pay

## 2024-06-07 MED ORDER — AMOXICILLIN 500 MG PO CAPS
2000.0000 mg | ORAL_CAPSULE | ORAL | 0 refills | Status: AC | PRN
  Filled 2024-06-07: qty 20, 5d supply, fill #0

## 2024-06-08 ENCOUNTER — Other Ambulatory Visit (HOSPITAL_COMMUNITY): Payer: Self-pay

## 2024-06-09 ENCOUNTER — Other Ambulatory Visit (HOSPITAL_COMMUNITY): Payer: Self-pay

## 2024-06-09 ENCOUNTER — Other Ambulatory Visit: Payer: Self-pay

## 2024-06-09 DIAGNOSIS — M9901 Segmental and somatic dysfunction of cervical region: Secondary | ICD-10-CM | POA: Diagnosis not present

## 2024-06-09 DIAGNOSIS — M9904 Segmental and somatic dysfunction of sacral region: Secondary | ICD-10-CM | POA: Diagnosis not present

## 2024-06-09 DIAGNOSIS — M9903 Segmental and somatic dysfunction of lumbar region: Secondary | ICD-10-CM | POA: Diagnosis not present

## 2024-06-09 DIAGNOSIS — M9902 Segmental and somatic dysfunction of thoracic region: Secondary | ICD-10-CM | POA: Diagnosis not present

## 2024-06-10 ENCOUNTER — Other Ambulatory Visit (HOSPITAL_COMMUNITY): Payer: Self-pay

## 2024-06-13 ENCOUNTER — Other Ambulatory Visit (HOSPITAL_COMMUNITY): Payer: Self-pay

## 2024-06-13 ENCOUNTER — Other Ambulatory Visit: Payer: Self-pay

## 2024-06-14 ENCOUNTER — Other Ambulatory Visit (HOSPITAL_COMMUNITY): Payer: Self-pay

## 2024-06-14 MED ORDER — RIZATRIPTAN BENZOATE 5 MG PO TBDP
5.0000 mg | ORAL_TABLET | ORAL | 3 refills | Status: AC
  Filled 2024-06-14: qty 15, 30d supply, fill #0
  Filled 2024-07-09: qty 15, 30d supply, fill #1
  Filled 2024-09-07: qty 15, 30d supply, fill #2
  Filled 2024-10-07: qty 8, 30d supply, fill #3
  Filled 2024-10-07 – 2024-10-28 (×3): qty 9, 30d supply, fill #3

## 2024-06-14 MED ORDER — DICLOFENAC SODIUM 1 % EX GEL
1.0000 | Freq: Three times a day (TID) | CUTANEOUS | 5 refills | Status: AC
  Filled 2024-06-14: qty 100, 30d supply, fill #0
  Filled 2024-08-27 – 2024-09-10 (×3): qty 100, 30d supply, fill #1
  Filled 2024-10-07 – 2024-10-28 (×2): qty 100, 30d supply, fill #2

## 2024-06-15 ENCOUNTER — Other Ambulatory Visit (HOSPITAL_COMMUNITY): Payer: Self-pay

## 2024-06-15 MED ORDER — RIZATRIPTAN BENZOATE 5 MG PO TBDP
ORAL_TABLET | ORAL | 3 refills | Status: AC
  Filled 2024-06-15: qty 15, 30d supply, fill #0
  Filled 2024-06-16: qty 15, fill #0
  Filled 2024-06-17: qty 15, 8d supply, fill #0
  Filled 2024-06-18: qty 15, 30d supply, fill #0
  Filled 2024-06-20: qty 15, fill #0
  Filled 2024-06-21: qty 15, 2d supply, fill #0
  Filled 2024-06-22: qty 15, fill #0

## 2024-06-16 ENCOUNTER — Other Ambulatory Visit (HOSPITAL_COMMUNITY): Payer: Self-pay

## 2024-06-17 ENCOUNTER — Other Ambulatory Visit: Payer: Self-pay

## 2024-06-17 ENCOUNTER — Other Ambulatory Visit (HOSPITAL_COMMUNITY): Payer: Self-pay

## 2024-06-18 ENCOUNTER — Other Ambulatory Visit (HOSPITAL_COMMUNITY): Payer: Self-pay

## 2024-06-20 ENCOUNTER — Other Ambulatory Visit (HOSPITAL_COMMUNITY): Payer: Self-pay

## 2024-06-21 ENCOUNTER — Other Ambulatory Visit (HOSPITAL_COMMUNITY): Payer: Self-pay

## 2024-06-21 DIAGNOSIS — M9903 Segmental and somatic dysfunction of lumbar region: Secondary | ICD-10-CM | POA: Diagnosis not present

## 2024-06-21 DIAGNOSIS — M9901 Segmental and somatic dysfunction of cervical region: Secondary | ICD-10-CM | POA: Diagnosis not present

## 2024-06-21 DIAGNOSIS — M9902 Segmental and somatic dysfunction of thoracic region: Secondary | ICD-10-CM | POA: Diagnosis not present

## 2024-06-21 DIAGNOSIS — M9904 Segmental and somatic dysfunction of sacral region: Secondary | ICD-10-CM | POA: Diagnosis not present

## 2024-06-22 ENCOUNTER — Other Ambulatory Visit (HOSPITAL_COMMUNITY): Payer: Self-pay

## 2024-06-22 ENCOUNTER — Other Ambulatory Visit: Payer: Self-pay

## 2024-06-28 ENCOUNTER — Other Ambulatory Visit (HOSPITAL_COMMUNITY): Payer: Self-pay

## 2024-06-28 MED ORDER — ROSUVASTATIN CALCIUM 20 MG PO TABS
20.0000 mg | ORAL_TABLET | Freq: Every day | ORAL | 3 refills | Status: AC
  Filled 2024-06-28: qty 90, 90d supply, fill #0

## 2024-06-30 ENCOUNTER — Encounter: Payer: Self-pay | Admitting: Physical Medicine & Rehabilitation

## 2024-06-30 ENCOUNTER — Other Ambulatory Visit: Payer: Self-pay

## 2024-06-30 ENCOUNTER — Encounter: Attending: Physical Medicine & Rehabilitation | Admitting: Physical Medicine & Rehabilitation

## 2024-06-30 ENCOUNTER — Other Ambulatory Visit (HOSPITAL_COMMUNITY): Payer: Self-pay

## 2024-06-30 VITALS — BP 120/76 | HR 70 | Ht 72.0 in | Wt 257.0 lb

## 2024-06-30 DIAGNOSIS — G43719 Chronic migraine without aura, intractable, without status migrainosus: Secondary | ICD-10-CM | POA: Diagnosis not present

## 2024-06-30 MED ORDER — SODIUM CHLORIDE (PF) 0.9 % IJ SOLN
4.0000 mL | Freq: Once | INTRAMUSCULAR | Status: AC
  Administered 2024-06-30: 4 mL

## 2024-06-30 MED ORDER — ONABOTULINUMTOXINA 100 UNITS IJ SOLR
200.0000 [IU] | Freq: Once | INTRAMUSCULAR | Status: AC
  Administered 2024-06-30: 200 [IU] via INTRAMUSCULAR

## 2024-06-30 NOTE — Progress Notes (Signed)
 Botox  injection for chronic migraine prophylaxis.  Indication: History of migraines with greater than 15 headaches per month despite trials of oral medications.  Informed consent was obtained after discussing risks and benefits of the procedure with the patient this included bleeding bruising infection as well as facial drooping, eyelid lag, systemic effects of Botox . Patient elects to proceed and has given written consent.  Patient placed in a seated position Dilution 50 units per cc  Muscles injected with dose  Procerus 5 units Corrugator 5 units bilateral Frontalis 5 units 2 injection sites on the right and 2 injection sites on the left Temporalis 5 units in 4 injection sites on the right and 4 injection sites on the left Occipitalis 5 units into 3 injections sites on the right and 3 injection sites on the left  Cervical paraspinals 5 units into 2 injection sites on the right and 2 injection sites on the left Trapezius 5U x 3 Right and 5U x 3 Left  Right T2 paraspinal 5U   RIght splenius cervicis 5U Right levator scap 5U x 2  Waste 20U Patient tolerated procedure well. Post procedure instructions given.

## 2024-07-01 ENCOUNTER — Other Ambulatory Visit (HOSPITAL_COMMUNITY): Payer: Self-pay

## 2024-07-01 MED ORDER — NYSTATIN 100000 UNIT/ML MT SUSP
4.0000 mL | Freq: Four times a day (QID) | OROMUCOSAL | 2 refills | Status: AC
  Filled 2024-07-01: qty 224, 14d supply, fill #0
  Filled 2024-07-12: qty 224, 14d supply, fill #1
  Filled 2024-07-26: qty 224, 14d supply, fill #2

## 2024-07-01 MED ORDER — NYSTATIN 100000 UNIT/ML MT SUSP
4.0000 mL | Freq: Four times a day (QID) | OROMUCOSAL | 2 refills | Status: AC
  Filled 2024-07-01 – 2024-08-09 (×4): qty 224, 14d supply, fill #0
  Filled 2024-08-23: qty 224, 14d supply, fill #1
  Filled 2024-09-06: qty 224, 14d supply, fill #2

## 2024-07-02 ENCOUNTER — Other Ambulatory Visit: Payer: Self-pay

## 2024-07-03 ENCOUNTER — Other Ambulatory Visit (HOSPITAL_COMMUNITY): Payer: Self-pay

## 2024-07-08 ENCOUNTER — Other Ambulatory Visit (HOSPITAL_COMMUNITY): Payer: Self-pay

## 2024-07-08 MED ORDER — COMIRNATY 30 MCG/0.3ML IM SUSY
0.3000 mL | PREFILLED_SYRINGE | Freq: Once | INTRAMUSCULAR | 0 refills | Status: AC
  Filled 2024-07-08: qty 0.3, 1d supply, fill #0

## 2024-07-08 MED ORDER — FLUZONE 0.5 ML IM SUSY
0.5000 mL | PREFILLED_SYRINGE | Freq: Once | INTRAMUSCULAR | 0 refills | Status: AC
  Filled 2024-07-08: qty 0.5, 1d supply, fill #0

## 2024-07-18 ENCOUNTER — Other Ambulatory Visit (HOSPITAL_COMMUNITY): Payer: Self-pay

## 2024-07-18 MED ORDER — FLUCONAZOLE 100 MG PO TABS
100.0000 mg | ORAL_TABLET | Freq: Every morning | ORAL | 0 refills | Status: DC
  Filled 2024-07-18: qty 14, 14d supply, fill #0

## 2024-07-21 ENCOUNTER — Other Ambulatory Visit (HOSPITAL_COMMUNITY): Payer: Self-pay

## 2024-07-24 ENCOUNTER — Other Ambulatory Visit (HOSPITAL_COMMUNITY): Payer: Self-pay

## 2024-07-25 ENCOUNTER — Other Ambulatory Visit (HOSPITAL_COMMUNITY): Payer: Self-pay

## 2024-07-25 DIAGNOSIS — Z1212 Encounter for screening for malignant neoplasm of rectum: Secondary | ICD-10-CM | POA: Diagnosis not present

## 2024-07-25 DIAGNOSIS — Z125 Encounter for screening for malignant neoplasm of prostate: Secondary | ICD-10-CM | POA: Diagnosis not present

## 2024-07-25 MED ORDER — TADALAFIL 5 MG PO TABS
5.0000 mg | ORAL_TABLET | Freq: Every day | ORAL | 3 refills | Status: AC
  Filled 2024-07-25 – 2024-10-26 (×2): qty 90, 90d supply, fill #0

## 2024-07-26 DIAGNOSIS — E785 Hyperlipidemia, unspecified: Secondary | ICD-10-CM | POA: Diagnosis not present

## 2024-07-26 DIAGNOSIS — K219 Gastro-esophageal reflux disease without esophagitis: Secondary | ICD-10-CM | POA: Diagnosis not present

## 2024-07-26 DIAGNOSIS — I10 Essential (primary) hypertension: Secondary | ICD-10-CM | POA: Diagnosis not present

## 2024-07-26 DIAGNOSIS — M5416 Radiculopathy, lumbar region: Secondary | ICD-10-CM | POA: Diagnosis not present

## 2024-07-26 DIAGNOSIS — E1151 Type 2 diabetes mellitus with diabetic peripheral angiopathy without gangrene: Secondary | ICD-10-CM | POA: Diagnosis not present

## 2024-07-26 DIAGNOSIS — R5382 Chronic fatigue, unspecified: Secondary | ICD-10-CM | POA: Diagnosis not present

## 2024-07-27 ENCOUNTER — Telehealth: Payer: Self-pay | Admitting: Internal Medicine

## 2024-07-27 NOTE — Telephone Encounter (Signed)
 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 States he has had this 3 times and has been treated with multiple doses of fluconazole  and nystatin  and it just will not go away. Reports he also has some gerd issues along with thrush. Pt states pcp wanted him to see GI first prior to possibly sending him to ID. Pt would specifically like to see Dr Abran within the next 1-2 weeks. Please advise.

## 2024-07-27 NOTE — Telephone Encounter (Signed)
 It looks like I have October 31 at 3 PM. 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

## 2024-07-27 NOTE — Telephone Encounter (Signed)
 Inbound call from patient requesting to speak with nurse. States he has had thrush for about 2 months. States he consulted with his primary care provider and was advised to contact Dr. Abran for further recommendations. Requesting a call back to discuss further. Please advise, thank you.

## 2024-07-27 NOTE — Telephone Encounter (Signed)
 The appt on 10/31 was used to fix the schedule overbook on 10/30. Pt scheduled to see Dr Abran on 08/16/24 at 3pm, he is aware of appt.

## 2024-07-31 DIAGNOSIS — Z125 Encounter for screening for malignant neoplasm of prostate: Secondary | ICD-10-CM | POA: Diagnosis not present

## 2024-08-01 DIAGNOSIS — I739 Peripheral vascular disease, unspecified: Secondary | ICD-10-CM | POA: Diagnosis not present

## 2024-08-01 DIAGNOSIS — Z96649 Presence of unspecified artificial hip joint: Secondary | ICD-10-CM | POA: Diagnosis not present

## 2024-08-01 DIAGNOSIS — R82998 Other abnormal findings in urine: Secondary | ICD-10-CM | POA: Diagnosis not present

## 2024-08-01 DIAGNOSIS — I10 Essential (primary) hypertension: Secondary | ICD-10-CM | POA: Diagnosis not present

## 2024-08-01 DIAGNOSIS — M5416 Radiculopathy, lumbar region: Secondary | ICD-10-CM | POA: Diagnosis not present

## 2024-08-01 DIAGNOSIS — Z0000 Encounter for general adult medical examination without abnormal findings: Secondary | ICD-10-CM | POA: Diagnosis not present

## 2024-08-01 DIAGNOSIS — Z113 Encounter for screening for infections with a predominantly sexual mode of transmission: Secondary | ICD-10-CM | POA: Diagnosis not present

## 2024-08-01 DIAGNOSIS — E785 Hyperlipidemia, unspecified: Secondary | ICD-10-CM | POA: Diagnosis not present

## 2024-08-01 DIAGNOSIS — E1151 Type 2 diabetes mellitus with diabetic peripheral angiopathy without gangrene: Secondary | ICD-10-CM | POA: Diagnosis not present

## 2024-08-01 DIAGNOSIS — B370 Candidal stomatitis: Secondary | ICD-10-CM | POA: Diagnosis not present

## 2024-08-01 DIAGNOSIS — M542 Cervicalgia: Secondary | ICD-10-CM | POA: Diagnosis not present

## 2024-08-01 DIAGNOSIS — Z1331 Encounter for screening for depression: Secondary | ICD-10-CM | POA: Diagnosis not present

## 2024-08-02 ENCOUNTER — Other Ambulatory Visit (HOSPITAL_COMMUNITY): Payer: Self-pay

## 2024-08-02 ENCOUNTER — Other Ambulatory Visit: Payer: Self-pay

## 2024-08-02 MED ORDER — OXYCODONE HCL 5 MG PO TABS
5.0000 mg | ORAL_TABLET | Freq: Three times a day (TID) | ORAL | 0 refills | Status: AC | PRN
  Filled 2024-08-02: qty 180, 30d supply, fill #0

## 2024-08-02 MED ORDER — LIDOCAINE 4 % EX CREA
1.0000 | TOPICAL_CREAM | Freq: Four times a day (QID) | CUTANEOUS | 3 refills | Status: DC
  Filled 2024-08-02: qty 30, 14d supply, fill #0
  Filled 2024-08-11: qty 30, 14d supply, fill #1
  Filled 2024-08-25: qty 30, 14d supply, fill #2
  Filled 2024-09-08: qty 30, 14d supply, fill #3

## 2024-08-02 MED ORDER — METFORMIN HCL 1000 MG PO TABS
1000.0000 mg | ORAL_TABLET | Freq: Two times a day (BID) | ORAL | 3 refills | Status: AC
  Filled 2024-08-02: qty 180, 90d supply, fill #0
  Filled 2024-10-26: qty 180, 90d supply, fill #1

## 2024-08-02 MED ORDER — MOVANTIK 25 MG PO TABS
25.0000 mg | ORAL_TABLET | ORAL | 3 refills | Status: AC
  Filled 2024-08-02 – 2024-08-04 (×3): qty 90, 90d supply, fill #0

## 2024-08-02 MED ORDER — ROSUVASTATIN CALCIUM 20 MG PO TABS
20.0000 mg | ORAL_TABLET | Freq: Every day | ORAL | 3 refills | Status: AC
  Filled 2024-08-02: qty 90, 90d supply, fill #0

## 2024-08-02 MED ORDER — METHOCARBAMOL 500 MG PO TABS
500.0000 mg | ORAL_TABLET | Freq: Two times a day (BID) | ORAL | 0 refills | Status: AC | PRN
  Filled 2024-08-02: qty 180, 90d supply, fill #0

## 2024-08-02 MED ORDER — CYCLOBENZAPRINE HCL 10 MG PO TABS
10.0000 mg | ORAL_TABLET | Freq: Three times a day (TID) | ORAL | 1 refills | Status: AC | PRN
  Filled 2024-08-02: qty 270, 90d supply, fill #0

## 2024-08-02 MED ORDER — DIAZEPAM 5 MG PO TABS
5.0000 mg | ORAL_TABLET | Freq: Two times a day (BID) | ORAL | 0 refills | Status: AC
  Filled 2024-08-02: qty 60, 30d supply, fill #0

## 2024-08-02 MED ORDER — ITRACONAZOLE 10 MG/ML PO SOLN
100.0000 mg | Freq: Two times a day (BID) | ORAL | 0 refills | Status: DC
  Filled 2024-08-02: qty 200, 10d supply, fill #0

## 2024-08-02 MED ORDER — TOPIRAMATE 25 MG PO TABS
25.0000 mg | ORAL_TABLET | Freq: Every day | ORAL | 3 refills | Status: AC
  Filled 2024-08-02: qty 90, 90d supply, fill #0
  Filled 2024-10-26: qty 90, 90d supply, fill #1

## 2024-08-02 MED ORDER — MOUNJARO 10 MG/0.5ML ~~LOC~~ SOAJ
0.5000 mL | SUBCUTANEOUS | 1 refills | Status: AC
  Filled 2024-08-02: qty 6, 84d supply, fill #0
  Filled 2024-08-03: qty 12, 168d supply, fill #0
  Filled 2024-08-04: qty 6, 84d supply, fill #0

## 2024-08-02 MED ORDER — DICLOFENAC SODIUM 1 % EX GEL
1.0000 | Freq: Three times a day (TID) | CUTANEOUS | 5 refills | Status: AC
  Filled 2024-08-02: qty 100, 30d supply, fill #0

## 2024-08-02 MED ORDER — TADALAFIL 5 MG PO TABS
5.0000 mg | ORAL_TABLET | Freq: Every day | ORAL | 3 refills | Status: AC
  Filled 2024-08-02: qty 90, 90d supply, fill #0

## 2024-08-02 MED ORDER — NURTEC 75 MG PO TBDP
75.0000 mg | ORAL_TABLET | Freq: Every day | ORAL | 3 refills | Status: AC
  Filled 2024-08-02: qty 18, 30d supply, fill #0
  Filled 2024-08-27 – 2024-09-10 (×3): qty 18, 30d supply, fill #1
  Filled 2024-10-07: qty 18, 30d supply, fill #2
  Filled 2024-10-08: qty 16, 30d supply, fill #2

## 2024-08-02 MED ORDER — TRAMADOL HCL 50 MG PO TABS
50.0000 mg | ORAL_TABLET | Freq: Four times a day (QID) | ORAL | 0 refills | Status: DC | PRN
  Filled 2024-08-02: qty 240, 30d supply, fill #0

## 2024-08-02 MED ORDER — ZOLPIDEM TARTRATE 10 MG PO TABS
10.0000 mg | ORAL_TABLET | Freq: Every evening | ORAL | 0 refills | Status: AC
  Filled 2024-08-02 – 2024-08-16 (×2): qty 90, 90d supply, fill #0

## 2024-08-02 MED ORDER — HYDROCORTISONE ACETATE 25 MG RE SUPP
25.0000 mg | Freq: Two times a day (BID) | RECTAL | 3 refills | Status: AC
  Filled 2024-08-02: qty 20, 10d supply, fill #0
  Filled 2024-08-07: qty 20, 10d supply, fill #1

## 2024-08-02 MED ORDER — XARELTO 20 MG PO TABS
20.0000 mg | ORAL_TABLET | Freq: Every day | ORAL | 3 refills | Status: AC
  Filled 2024-08-02: qty 90, 90d supply, fill #0

## 2024-08-02 MED ORDER — GABAPENTIN 600 MG PO TABS
600.0000 mg | ORAL_TABLET | Freq: Three times a day (TID) | ORAL | 3 refills | Status: AC
  Filled 2024-08-02: qty 270, 90d supply, fill #0

## 2024-08-02 MED ORDER — LIDOCAINE 5 % EX PTCH
1.0000 | MEDICATED_PATCH | Freq: Two times a day (BID) | CUTANEOUS | 3 refills | Status: AC
  Filled 2024-08-02 – 2024-08-05 (×4): qty 90, 45d supply, fill #0

## 2024-08-02 MED ORDER — FINASTERIDE 5 MG PO TABS
5.0000 mg | ORAL_TABLET | Freq: Every day | ORAL | 3 refills | Status: AC
  Filled 2024-08-02: qty 90, 90d supply, fill #0

## 2024-08-02 MED ORDER — RIZATRIPTAN BENZOATE 5 MG PO TBDP
5.0000 mg | ORAL_TABLET | ORAL | 3 refills | Status: AC
  Filled 2024-08-02: qty 15, 30d supply, fill #0

## 2024-08-02 MED ORDER — PANTOPRAZOLE SODIUM 40 MG PO TBEC
40.0000 mg | DELAYED_RELEASE_TABLET | Freq: Two times a day (BID) | ORAL | 3 refills | Status: AC
  Filled 2024-08-02: qty 180, 90d supply, fill #0
  Filled 2024-10-26: qty 180, 90d supply, fill #1

## 2024-08-02 MED ORDER — BACLOFEN 10 MG PO TABS
10.0000 mg | ORAL_TABLET | Freq: Two times a day (BID) | ORAL | 1 refills | Status: AC | PRN
  Filled 2024-08-02: qty 360, 90d supply, fill #0

## 2024-08-03 ENCOUNTER — Other Ambulatory Visit (HOSPITAL_COMMUNITY): Payer: Self-pay

## 2024-08-03 ENCOUNTER — Other Ambulatory Visit: Payer: Self-pay

## 2024-08-04 ENCOUNTER — Other Ambulatory Visit: Payer: Self-pay

## 2024-08-04 ENCOUNTER — Other Ambulatory Visit (HOSPITAL_COMMUNITY): Payer: Self-pay

## 2024-08-04 MED ORDER — DICYCLOMINE HCL 20 MG PO TABS
ORAL_TABLET | ORAL | 5 refills | Status: AC
  Filled 2024-08-04: qty 180, 30d supply, fill #0
  Filled 2024-09-08: qty 180, 30d supply, fill #1
  Filled 2024-10-08 – 2024-10-28 (×2): qty 180, 30d supply, fill #2

## 2024-08-04 MED ORDER — BUTALBITAL-APAP-CAFFEINE 50-325-40 MG PO TABS
1.0000 | ORAL_TABLET | ORAL | 0 refills | Status: DC | PRN
  Filled 2024-08-04: qty 180, 15d supply, fill #0

## 2024-08-05 ENCOUNTER — Other Ambulatory Visit (HOSPITAL_COMMUNITY): Payer: Self-pay

## 2024-08-05 ENCOUNTER — Other Ambulatory Visit: Payer: Self-pay

## 2024-08-08 ENCOUNTER — Other Ambulatory Visit (HOSPITAL_COMMUNITY): Payer: Self-pay

## 2024-08-08 MED ORDER — TADALAFIL 5 MG PO TABS: 5.0000 mg | ORAL_TABLET | Freq: Every day | ORAL | 3 refills | Status: AC

## 2024-08-09 ENCOUNTER — Other Ambulatory Visit (HOSPITAL_COMMUNITY): Payer: Self-pay

## 2024-08-11 ENCOUNTER — Ambulatory Visit: Admitting: Internal Medicine

## 2024-08-11 ENCOUNTER — Other Ambulatory Visit (HOSPITAL_COMMUNITY): Payer: Self-pay

## 2024-08-11 ENCOUNTER — Other Ambulatory Visit: Payer: Self-pay

## 2024-08-15 ENCOUNTER — Other Ambulatory Visit (HOSPITAL_COMMUNITY): Payer: Self-pay

## 2024-08-15 MED ORDER — FLUCONAZOLE 100 MG PO TABS
ORAL_TABLET | ORAL | 0 refills | Status: DC
  Filled 2024-08-15: qty 14, 14d supply, fill #0

## 2024-08-16 ENCOUNTER — Other Ambulatory Visit (HOSPITAL_COMMUNITY): Payer: Self-pay

## 2024-08-16 ENCOUNTER — Ambulatory Visit: Admitting: Internal Medicine

## 2024-08-16 ENCOUNTER — Encounter: Payer: Self-pay | Admitting: Internal Medicine

## 2024-08-16 ENCOUNTER — Telehealth: Payer: Self-pay

## 2024-08-16 VITALS — BP 114/64 | HR 77 | Ht 72.0 in | Wt 257.1 lb

## 2024-08-16 DIAGNOSIS — M9905 Segmental and somatic dysfunction of pelvic region: Secondary | ICD-10-CM | POA: Diagnosis not present

## 2024-08-16 DIAGNOSIS — M9903 Segmental and somatic dysfunction of lumbar region: Secondary | ICD-10-CM | POA: Diagnosis not present

## 2024-08-16 DIAGNOSIS — M9901 Segmental and somatic dysfunction of cervical region: Secondary | ICD-10-CM | POA: Diagnosis not present

## 2024-08-16 DIAGNOSIS — B370 Candidal stomatitis: Secondary | ICD-10-CM

## 2024-08-16 DIAGNOSIS — K219 Gastro-esophageal reflux disease without esophagitis: Secondary | ICD-10-CM | POA: Diagnosis not present

## 2024-08-16 DIAGNOSIS — M9904 Segmental and somatic dysfunction of sacral region: Secondary | ICD-10-CM | POA: Diagnosis not present

## 2024-08-16 NOTE — Patient Instructions (Signed)
 You have been scheduled for an endoscopy. Please follow written instructions given to you at your visit today.  If you use inhalers (even only as needed), please bring them with you on the day of your procedure.  If you take any of the following medications, they will need to be adjusted prior to your procedure:   DO NOT TAKE 7 DAYS PRIOR TO TEST- Trulicity (dulaglutide) Ozempic, Wegovy (semaglutide) Mounjaro  (tirzepatide ) Bydureon Bcise (exanatide extended release)  DO NOT TAKE 1 DAY PRIOR TO YOUR TEST Rybelsus (semaglutide) Adlyxin (lixisenatide) Victoza (liraglutide) Byetta (exanatide) ___________________________________________________________________________  _______________________________________________________  If your blood pressure at your visit was 140/90 or greater, please contact your primary care physician to follow up on this.  _______________________________________________________  If you are age 64 or older, your body mass index should be between 23-30. Your Body mass index is 34.87 kg/m. If this is out of the aforementioned range listed, please consider follow up with your Primary Care Provider.  If you are age 39 or younger, your body mass index should be between 19-25. Your Body mass index is 34.87 kg/m. If this is out of the aformentioned range listed, please consider follow up with your Primary Care Provider.   ________________________________________________________  The [facility] providers would like to encourage you to use MYCHART to communicate with providers for non-urgent requests or questions.  Due to long hold times on the telephone, sending your provider a message by [facility] may be a faster and more efficient way to get a response.  Please allow 48 business hours for a response.  Please remember that this is for non-urgent requests.  _______________________________________________________  [facility] is using a team-based approach to  care.  Your team is made up of your doctor and two to three APPS. Our APPS (Nurse Practitioners and Physician Assistants) work with your physician to ensure care continuity for you. They are fully qualified to address your health concerns and develop a treatment plan. They communicate directly with your gastroenterologist to care for you. Seeing the Advanced Practice Practitioners on your physician's team can help you by facilitating care more promptly, often allowing for earlier appointments, access to diagnostic testing, procedures, and other specialty referrals.

## 2024-08-16 NOTE — Telephone Encounter (Signed)
 Anticoag letter faxed to Dr. Yolande.

## 2024-08-16 NOTE — Progress Notes (Signed)
 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  I last saw the patient in the office May 31, 2020 regarding worsening diarrhea, prolapsing hemorrhoids, and concerns over heavy metal toxicity.  See that dictation for details.  Prior GI procedures include the following:  1.  Colonoscopy May 26, 2017.  Pandiverticulosis and internal hemorrhoids 2.  Flexible sigmoidoscopy November 05, 2021.  69 thrombosed internal and external hemorrhoids with clot  The current history is that of recurrent thrush over the past 3 months.  He has been on Mounjaro  (epic questioning that is a potential cause).  He has been treated with fluconazole , nystatin , and itraconazole  with variable results.  He has had periodic antibiotics but no overwhelming correlation with antibiotics and his issues with thrush.  He does report reflux symptoms but is not on PPI.  No odynophagia.  He also describes hoarseness and coughing up thick material at times.  He has not seen ENT.  He is scheduled to see ID in 9 days.  Most recent A1c  REVIEW OF SYSTEMS:  All non-GI ROS negative except for back pain, headaches, sore throat, voice change  Past Medical History:  Diagnosis Date   BPH (benign prostatic hyperplasia)    Chronic pain syndrome    neck and low back   DDD (degenerative disc disease), cervical    Diverticulosis    DJD (degenerative joint disease)    Ganglion cyst    12 mm , right posterior knee   GERD (gastroesophageal reflux disease)    Hyperlipidemia    Internal hemorrhoids    Migraines    OA (osteoarthritis)    knees, hands, right shoulder   Pneumonia    Positive QuantiFERON-TB Gold test    Shoulder pain    Tear of right rotator  cuff    Type 2 diabetes mellitus (HCC)    last A1c 5.4 on 04-13-2018 in epic (followed by pcp)   Wears glasses     Past Surgical History:  Procedure Laterality Date   ANAL FISSURE REPAIR     ARTHOSCOPIC ROTAOR CUFF REPAIR Right 06/15/2018   Procedure: RIGHT SHOULDER ARTHROSCOPY, DEBRIDEMENT, SUBACROMIAL DECOMPRESSION, DISTAL CLAVICLE RESECTION, ROTATOR CUFF REPAIR;  Surgeon: Gerome Charleston, MD;  Location: [facility] [place];  Service: Orthopedics;  Laterality: Right;   COLONOSCOPY  05-26-2017   dr abran SIDE SIGMOIDOSCOPY N/A 11/05/2021   Procedure: FLEXIBLE SIGMOIDOSCOPY;  Surgeon: Teressa Toribio SQUIBB, MD;  Location: [facility];  Service: Endoscopy;  Laterality: N/A;   Hip Resurface  2006   KNEE ARTHROSCOPY W/ MENISCAL REPAIR Bilateral right 1993; left 2010   MASS EXCISION Right 04/21/2018   Procedure: EXCISION RIGHT THIGH SOFT TISSUE MASS;  Surgeon: Melodi Lerner, MD;  Location: [facility];  Service: Orthopedics;  Laterality: Right;   SEPTOPLASTY     SHOULDER ARTHROSCOPY WITH ROTATOR CUFF REPAIR Left    SLAP Tear Repair  2008   TONSILLECTOMY     TOTAL KNEE ARTHROPLASTY Left 12/08/2016   Procedure: LEFT TOTAL KNEE ARTHROPLASTY;  Surgeon: Lerner Melodi, MD;  Location: [facility];  Service: Orthopedics;  Laterality: Left;   TOTAL KNEE ARTHROPLASTY WITH REVISION COMPONENTS Left 04/21/2018   Procedure: Left knee polyethylene exchange ;  Surgeon: Melodi,  Dempsey, MD;  Location: [facility];  Service: Orthopedics;  Laterality: Left;   TRANSURETHRAL RESECTION OF PROSTATE N/A 08/12/2023   Procedure: TRANSURETHRAL RESECTION OF THE PROSTATE (TURP);  Surgeon: Alvaro Ricardo KATHEE Mickey., MD;  Location: [facility];  Service: Urology;  Laterality: N/A;  60 MINS    Social History Daniel JINNY Ill, 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 He has never used smokeless tobacco. He reports current alcohol  use. He reports that he does not use drugs.  family history includes Heart attack (age of onset: 95) in his  father.  Allergies  Allergen Reactions   Benzoin Rash and Other (See Comments)    Blisters (Benzoin is the sap (gum resin) that comes from cuts in the trunk of trees that belong to the Styrax family.)        PHYSICAL EXAMINATION: Vital signs: BP 114/64 (BP Location: Left Arm, Patient Position: Sitting, Cuff Size: Large)   Pulse 77   Ht 6' (1.829 m)   Wt 257 lb 2 oz (116.6 kg)   BMI 34.87 kg/m  General: Well-developed, well-nourished, no acute distress HEENT: Sclerae are anicteric, conjunctiva pink. Oral mucosa intact Lungs: Clear Heart: Regular Abdomen: soft, nontender, nondistended. Extremities: No edema Psychiatric: alert and oriented x3. Cooperative   ASSESSMENT:  1.  Recurrent oral thrush.  Potential causes include diabetes, antibiotic exposure, immunodeficiency. 2.  GERD symptoms.  Not on PPI 3.  Multiple medical problems. 4.  History of DVT with PE on Xarelto  5.  Diabetes on Mounjaro  and metformin    PLAN:  1.  EGD to rule out esophageal candidiasis.  The patient is high risk given his comorbidities and the need to interrupt anticoagulation.The nature of the procedure, as well as the risks, benefits, and alternatives were carefully and thoroughly reviewed with the patient. Ample time for discussion and questions allowed. The patient understood, was satisfied, and agreed to proceed. 2.  Reflux precautions 3.  Hold Xarelto  for 2 days prior to the procedure 4.  Hold Mounjaro  1 week prior to the procedure 5.  Agree with ID evaluation 6.  Consider ENT 7.  Ongoing general medical care PCP A total time of 45 minutes was spent preparing to see the patient, reviewing outside records, obtaining comprehensive history, performing medically appropriate physical exam, counseling and educating the patient regarding the above listed issues, adjusting medications, ordering endoscopic procedure, and documenting clinical information in the health record

## 2024-08-17 NOTE — Telephone Encounter (Signed)
 Spoke to patient and told him that after clarifying with Dr. Abran yesterday - per Dr. Abran, it was ok to hold his Xarelto  for 2 days prior to his procedure.  Patient agreed

## 2024-08-23 ENCOUNTER — Other Ambulatory Visit (HOSPITAL_COMMUNITY): Payer: Self-pay

## 2024-08-23 ENCOUNTER — Encounter: Payer: Self-pay | Admitting: Internal Medicine

## 2024-08-23 ENCOUNTER — Ambulatory Visit: Admitting: Internal Medicine

## 2024-08-23 VITALS — BP 120/75 | HR 68 | Temp 97.2°F | Resp 12 | Ht 72.0 in | Wt 257.0 lb

## 2024-08-23 DIAGNOSIS — B370 Candidal stomatitis: Secondary | ICD-10-CM | POA: Diagnosis not present

## 2024-08-23 DIAGNOSIS — G9332 Myalgic encephalomyelitis/chronic fatigue syndrome: Secondary | ICD-10-CM | POA: Insufficient documentation

## 2024-08-23 DIAGNOSIS — E119 Type 2 diabetes mellitus without complications: Secondary | ICD-10-CM | POA: Diagnosis not present

## 2024-08-23 DIAGNOSIS — K219 Gastro-esophageal reflux disease without esophagitis: Secondary | ICD-10-CM | POA: Diagnosis not present

## 2024-08-23 DIAGNOSIS — K2100 Gastro-esophageal reflux disease with esophagitis, without bleeding: Secondary | ICD-10-CM | POA: Diagnosis not present

## 2024-08-23 DIAGNOSIS — Z7901 Long term (current) use of anticoagulants: Secondary | ICD-10-CM | POA: Insufficient documentation

## 2024-08-23 DIAGNOSIS — J329 Chronic sinusitis, unspecified: Secondary | ICD-10-CM | POA: Insufficient documentation

## 2024-08-23 DIAGNOSIS — K449 Diaphragmatic hernia without obstruction or gangrene: Secondary | ICD-10-CM

## 2024-08-23 DIAGNOSIS — M170 Bilateral primary osteoarthritis of knee: Secondary | ICD-10-CM | POA: Insufficient documentation

## 2024-08-23 MED ORDER — PANTOPRAZOLE SODIUM 40 MG PO TBEC
40.0000 mg | DELAYED_RELEASE_TABLET | Freq: Every day | ORAL | 11 refills | Status: AC
  Filled 2024-08-23: qty 30, 30d supply, fill #0

## 2024-08-23 MED ORDER — PANTOPRAZOLE SODIUM 40 MG PO TBEC
40.0000 mg | DELAYED_RELEASE_TABLET | Freq: Every day | ORAL | 3 refills | Status: AC
  Filled 2024-08-23 – 2024-08-29 (×8): qty 90, 90d supply, fill #0

## 2024-08-23 MED ORDER — SODIUM CHLORIDE 0.9 % IV SOLN: 500.0000 mL | Freq: Once | INTRAVENOUS | Status: DC

## 2024-08-23 NOTE — Progress Notes (Signed)
 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  I last saw the patient in the office May 31, 2020 regarding worsening diarrhea, prolapsing hemorrhoids, and concerns over heavy metal toxicity.  See that dictation for details.  Prior GI procedures include the following:   1.  Colonoscopy May 26, 2017.  Pandiverticulosis and internal hemorrhoids 2.  Flexible sigmoidoscopy November 05, 2021.  69 thrombosed internal and external hemorrhoids with clot   The current history is that of recurrent thrush over the past 3 months.  He has been on Mounjaro  (epic questioning that is a potential cause).  He has been treated with fluconazole , nystatin , and itraconazole  with variable results.  He has had periodic antibiotics but no overwhelming correlation with antibiotics and his issues with thrush.  He does report reflux symptoms but is not on PPI.  No odynophagia.  He also describes hoarseness and coughing up thick material at times.  He has not seen ENT.  He is scheduled to see ID in 9 days.   Most recent A1c   REVIEW OF SYSTEMS:   All non-GI ROS negative except for back pain, headaches, sore throat, voice change       Past Medical History:  Diagnosis Date   BPH (benign prostatic hyperplasia)     Chronic pain syndrome      neck and low back   DDD (degenerative disc disease), cervical     Diverticulosis     DJD (degenerative joint disease)     Ganglion cyst      12 mm , right posterior knee   GERD (gastroesophageal reflux disease)     Hyperlipidemia     Internal hemorrhoids     Migraines     OA (osteoarthritis)      knees, hands, right shoulder   Pneumonia     Positive QuantiFERON-TB  Gold test     Shoulder pain     Tear of right rotator cuff     Type 2 diabetes mellitus (HCC)      last A1c 5.4 on 04-13-2018 in epic (followed by pcp)   Wears glasses                 Past Surgical History:  Procedure Laterality Date   ANAL FISSURE REPAIR       ARTHOSCOPIC ROTAOR CUFF REPAIR Right 06/15/2018    Procedure: RIGHT SHOULDER ARTHROSCOPY, DEBRIDEMENT, SUBACROMIAL DECOMPRESSION, DISTAL CLAVICLE RESECTION, ROTATOR CUFF REPAIR;  Surgeon: Gerome Charleston, [name];  Location: [facility] [place];  Service: Orthopedics;  Laterality: Right;   COLONOSCOPY   05-26-2017   dr abran SIDE SIGMOIDOSCOPY N/A 11/05/2021    Procedure: FLEXIBLE SIGMOIDOSCOPY;  Surgeon: Teressa Toribio SQUIBB, [name];  Location: [facility];  Service: Endoscopy;  Laterality: N/A;   Hip Resurface   2006   KNEE ARTHROSCOPY W/ MENISCAL REPAIR Bilateral right 1993; left 2010   MASS EXCISION Right 04/21/2018    Procedure: EXCISION RIGHT THIGH SOFT TISSUE MASS;  Surgeon: Melodi Lerner, [name];  Location: [facility];  Service: Orthopedics;  Laterality: Right;   SEPTOPLASTY       SHOULDER ARTHROSCOPY WITH ROTATOR CUFF REPAIR Left     SLAP Tear Repair  2008   TONSILLECTOMY       TOTAL KNEE ARTHROPLASTY Left 12/08/2016    Procedure: LEFT TOTAL KNEE ARTHROPLASTY;  Surgeon: Dempsey Moan, [name];  Location: [facility];  Service: Orthopedics;  Laterality: Left;   TOTAL KNEE ARTHROPLASTY WITH REVISION COMPONENTS Left 04/21/2018    Procedure: Left knee polyethylene exchange ;  Surgeon: Moan Dempsey, [name];  Location: [facility];  Service: Orthopedics;  Laterality: Left;   TRANSURETHRAL RESECTION OF PROSTATE N/A 08/12/2023    Procedure: TRANSURETHRAL RESECTION OF THE PROSTATE (TURP);  Surgeon: Alvaro Ricardo KATHEE Mickey., [name];  Location: [facility];  Service: Urology;  Laterality: N/A;  52 MINS          Social History Daniel JINNY Ill, [name]  reports that he has never smoked. He has never used smokeless tobacco. He reports current alcohol  use. He reports that  he does not use drugs.   family history includes Heart attack (age of onset: 80) in his father.   Allergies       Allergies  Allergen Reactions   Benzoin Rash and Other (See Comments)      Blisters (Benzoin is the sap (gum resin) that comes from cuts in the trunk of trees that belong to the Styrax family.)              PHYSICAL EXAMINATION: Vital signs: BP 114/64 (BP Location: Left Arm, Patient Position: Sitting, Cuff Size: Large)   Pulse 77   Ht 6' (1.829 m)   Wt 257 lb 2 oz (116.6 kg)   BMI 34.87 kg/m  General: Well-developed, well-nourished, no acute distress HEENT: Sclerae are anicteric, conjunctiva pink. Oral mucosa intact Lungs: Clear Heart: Regular Abdomen: soft, nontender, nondistended. Extremities: No edema Psychiatric: alert and oriented x3. Cooperative     ASSESSMENT:   1.  Recurrent oral thrush.  Potential causes include diabetes, antibiotic exposure, immunodeficiency. 2.  GERD symptoms.  Not on PPI 3.  Multiple medical problems. 4.  History of DVT with PE on Xarelto  5.  Diabetes on Mounjaro  and metformin      PLAN:   1.  EGD to rule out esophageal candidiasis.  The patient is high risk given his comorbidities and the need to interrupt anticoagulation.The nature of the procedure, as well as the risks, benefits, and alternatives were carefully and thoroughly reviewed with the patient. Ample time for discussion and questions allowed. The patient understood, was satisfied, and agreed to proceed. 2.  Reflux precautions 3.  Hold Xarelto  for 2 days prior to the procedure 4.  Hold Mounjaro  1 week prior to the procedure 5.  Agree with ID evaluation 6.  Consider ENT 7.  Ongoing general medical care PCP

## 2024-08-23 NOTE — Patient Instructions (Signed)
 YOU HAD AN ENDOSCOPIC PROCEDURE TODAY AT THE [facility]:   Refer to the procedure report that was given to you for any specific questions about what was found during the examination.  If the procedure report does not answer your questions, please call your gastroenterologist to clarify.  If you requested that your care partner not be given the details of your procedure findings, then the procedure report has been included in a sealed envelope for you to review at your convenience later.  YOU SHOULD EXPECT: Some feelings of bloating in the abdomen. Passage of more gas than usual.  Walking can help get rid of the air that was put into your GI tract during the procedure and reduce the bloating. If you had a lower endoscopy (such as a colonoscopy or flexible sigmoidoscopy) you may notice spotting of blood in your stool or on the toilet paper. If you underwent a bowel prep for your procedure, you may not have a normal bowel movement for a few days.  Please Note:  You might notice some irritation and congestion in your nose or some drainage.  This is from the oxygen used during your procedure.  There is no need for concern and it should clear up in a day or so.  SYMPTOMS TO REPORT IMMEDIATELY:  Following upper endoscopy (EGD)  Vomiting of blood or coffee ground material  New chest pain or pain under the shoulder blades  Painful or persistently difficult swallowing  New shortness of breath  Fever of 100F or higher  Black, tarry-looking stools  Resume previous diet Continue present medications Resume Xarelto  today Pantoprazole  - 40 mg daily.  Take 1 daily for active reflux.    For urgent or emergent issues, a gastroenterologist can be reached at any hour by calling [phone]. Do not use MyChart messaging for urgent concerns.    DIET:  We do recommend a small meal at first, but then you may proceed to your regular diet.  Drink plenty of fluids but you should avoid alcoholic  beverages for 24 hours.  ACTIVITY:  You should plan to take it easy for the rest of today and you should NOT DRIVE or use heavy machinery until tomorrow (because of the sedation medicines used during the test).    FOLLOW UP: Our staff will call the number listed on your records the next business day following your procedure.  We will call around 7:15- 8:00 am to check on you and address any questions or concerns that you may have regarding the information given to you following your procedure. If we do not reach you, we will leave a message.     If any biopsies were taken you will be contacted by phone or by letter within the next 1-3 weeks.  Please call us  at [phone] if you have not heard about the biopsies in 3 weeks.    SIGNATURES/CONFIDENTIALITY: You and/or your care partner have signed paperwork which will be entered into your electronic medical record.  These signatures attest to the fact that that the information above on your After Visit Summary has been reviewed and is understood.  Full responsibility of the confidentiality of this discharge information lies with you and/or your care-partner.

## 2024-08-23 NOTE — Progress Notes (Signed)
 Pt's states no medical or surgical changes since previsit or office visit.

## 2024-08-23 NOTE — Progress Notes (Signed)
 Sedate, gd SR, tolerated procedure well, VSS, report to RN

## 2024-08-23 NOTE — Op Note (Signed)
 [facility] Patient Name: [name] Procedure Date: 08/23/2024 3:50 PM MRN: [id number] Endoscopist: Norleen SAILOR. Abran , MD, [id number] Age: 63 Referring MD:  Date of Birth: 1961/03/05 Gender: Male Account #: [id number] Procedure:                Upper GI endoscopy Indications:              Esophageal reflux, recurrent oral thrush Medicines:                Monitored Anesthesia Care Procedure:                Pre-Anesthesia Assessment:                           - Prior to the procedure, a History and Physical                            was performed, and patient medications and                            allergies were reviewed. The patient's tolerance of                            previous anesthesia was also reviewed. The risks                            and benefits of the procedure and the sedation                            options and risks were discussed with the patient.                            All questions were answered, and informed consent                            was obtained. Prior Anticoagulants: The patient has                            taken Xarelto  (rivaroxaban ), last dose was 4 days                            prior to procedure. ASA Grade Assessment: III - A                            patient with severe systemic disease. After                            reviewing the risks and benefits, the patient was                            deemed in satisfactory condition to undergo the                            procedure.  After obtaining informed consent, the endoscope was                            passed under direct vision. Throughout the                            procedure, the patient's blood pressure, pulse, and                            oxygen saturations were monitored continuously. The                            GIF [id number] #[id number] was introduced through the                            mouth, and advanced to the second part of  duodenum.                            The upper GI endoscopy was accomplished without                            difficulty. The patient tolerated the procedure                            well. Scope In: Scope Out: Findings:                 The esophagus revealed mild distal esophagitis as                            manifested by mucosal edema and friability at the                            Z-line.                           The stomach was normal except for small hiatal                            hernia.                           The examined duodenum was normal.                           The cardia and gastric fundus were normal on                            retroflexion. Complications:            No immediate complications. Estimated Blood Loss:     Estimated blood loss: none. Impression:               1. GERD with mild esophagitis                           2. No esophageal candidiasis  3. Small hiatal hernia. Otherwise normal EGD. Recommendation:           - Patient has a contact number available for                            emergencies. The signs and symptoms of potential                            delayed complications were discussed with the                            patient. Return to normal activities tomorrow.                            Written discharge instructions were provided to the                            patient.                           - Resume previous diet.                           - Continue present medications.                           - Resume Xarelto  today                           - Prescribe pantoprazole  40 mg daily; #30; 11                            refills. Take 1 daily for active reflux symptoms                            and reflux esophagitis                           - Ongoing evaluation of recurrent thrush per ID and                            PCP Norleen SAILOR. Abran, MD 08/23/2024 4:13:31 PM This report has been signed  electronically.

## 2024-08-24 ENCOUNTER — Telehealth: Payer: Self-pay

## 2024-08-24 ENCOUNTER — Other Ambulatory Visit (HOSPITAL_COMMUNITY): Payer: Self-pay

## 2024-08-24 NOTE — Telephone Encounter (Signed)
 No answer on follow up call - voice mail message left

## 2024-08-25 ENCOUNTER — Other Ambulatory Visit: Payer: Self-pay

## 2024-08-25 ENCOUNTER — Encounter: Payer: Self-pay | Admitting: Internal Medicine

## 2024-08-25 ENCOUNTER — Ambulatory Visit (INDEPENDENT_AMBULATORY_CARE_PROVIDER_SITE_OTHER): Admitting: Internal Medicine

## 2024-08-25 ENCOUNTER — Other Ambulatory Visit (HOSPITAL_COMMUNITY): Payer: Self-pay

## 2024-08-25 VITALS — BP 121/81 | HR 71 | Temp 98.9°F | Ht 72.0 in | Wt 256.0 lb

## 2024-08-25 DIAGNOSIS — B370 Candidal stomatitis: Secondary | ICD-10-CM | POA: Diagnosis not present

## 2024-08-25 MED ORDER — FLUCONAZOLE 200 MG PO TABS
400.0000 mg | ORAL_TABLET | Freq: Every day | ORAL | 0 refills | Status: AC
  Filled 2024-08-25: qty 28, 14d supply, fill #0

## 2024-08-25 MED ORDER — LOPERAMIDE HCL 2 MG PO CAPS
ORAL_CAPSULE | ORAL | 3 refills | Status: AC
  Filled 2024-08-25: qty 120, 30d supply, fill #0
  Filled 2024-09-22: qty 120, 30d supply, fill #1
  Filled 2024-10-22: qty 120, 30d supply, fill #2

## 2024-08-25 NOTE — Progress Notes (Signed)
 Patient ID: Daniel JINNY Ill, MD, male   DOB: [birth year], 63 y.o.   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 He reports that he started to have new onset presistent thrush, not responding to nystatin , in the setting of worsening GERD, thought to be associated to [facility]. He also had abtx exposure roughly 3 months ago for dental procedure, took 1 day of amoxicillin  and again this past week for dental procedure in anticipation for implant. In terms of thrush management, he was given initially liquid nystatin , course of fluconazole  of 200mg  load then 100mg  daily. Due to no response, he was also taking a course of itraconazole  liquid. He has noticed some improvement but still there, noticeable coating of tongue plus notices some sensation of numbness to tip of tongue vs. rawness He had EGD this past week that only showed mild esophagitis/ no biopsy --- 11/12, no visual evidence of candidal esophagitis, though sometimes he feels it is back of throat.  No horseness, no lymphadenopathy, no recent steroids  Outpatient Encounter Medications as of 08/25/2024  Medication Sig   amoxicillin  (AMOXIL ) 500 MG capsule Take 4 capsules (2,000 mg total) by mouth 1 hour prior to surgery   amoxicillin  (AMOXIL ) 500 MG capsule Take 4 capsules by mouth 1 hour before dental proceedure   amoxicillin -clavulanate (AUGMENTIN ) 500-125 MG tablet Take 1 tablet by mouth 2 (two) times daily for suspected cystitis   amoxicillin -clavulanate (AUGMENTIN ) 875-125 MG tablet Take 1 tablet by mouth 2 (two) times daily.   baclofen  (LIORESAL ) 10 MG tablet Take 1-2 tablets (10-20 mg total) by mouth 2 (two) times daily as needed.   baclofen  (LIORESAL ) 10 MG tablet Take 1-2 tablets (10-20 mg total) by mouth 2 (two) times daily as needed.   baclofen  (LIORESAL ) 10 MG tablet Take  1-2 tablets (10-20 mg total) by mouth 2 (two) times daily as needed.   baclofen  (LIORESAL ) 10 MG tablet Take 1-2 tablets (10-20 mg total) by mouth 2 (two) times daily as needed.   butalbital -acetaminophen -caffeine  (FIORICET ) 50-325-40 MG tablet Take 1-2 tablets by mouth every 4-6 hours as needed (max 6 tabs in 24 hours)   butalbital -acetaminophen -caffeine  (FIORICET ) 50-325-40 MG tablet Take 1-2 tablets by mouth every 4-6 hours as needed.  Max 6 tablets/24 hours.   butalbital -acetaminophen -caffeine  (FIORICET ) 50-325-40 MG tablet Take 1-2 tablets by mouth every 4-6 hours as needed.  Max 6 tab/24 hrs.   butalbital -acetaminophen -caffeine  (FIORICET ) 50-325-40 MG tablet Take 1-2 tablets by mouth every 4 to 6 hours as needed. max 6 tablets in 24 hours   cyclobenzaprine  (FLEXERIL ) 10 MG tablet Take 1 tablet (10 mg total) by mouth 3 (three) times daily as needed.   cyclobenzaprine  (FLEXERIL ) 10 MG tablet Take 1 tablet (10 mg total) by mouth 3 (three) times daily as needed.   cyclobenzaprine  (FLEXERIL ) 10 MG tablet Take 1 tablet (10 mg total) by mouth 3 (three) times daily as needed.   cyclobenzaprine  (FLEXERIL ) 10 MG tablet Take 1 tablet (10 mg total) by mouth 3 (three) times daily as needed.   diazepam  (VALIUM ) 5 MG tablet Take 1 tablet (5 mg total) by mouth 2 (two) times daily.   diazepam  (VALIUM ) 5 MG tablet Take 1 tablet (5 mg total) by mouth 2 (two) times daily, (every 12 hours.)   diazepam  (VALIUM ) 5 MG tablet Take 1 tablet (5  mg total) by mouth every 12 (twelve) hours.   diclofenac  Sodium (VOLTAREN ) 1 % GEL Apply 1 gram to affected area three times daily as needed.   diclofenac  Sodium (VOLTAREN ) 1 % GEL Apply 1 gram to affected area three times daily as needed.   dicyclomine  (BENTYL ) 20 MG tablet Take 1 tablet by mouth every 4 hours as needed   finasteride  (PROSCAR ) 5 MG tablet Take 1 tablet (5 mg total) by mouth daily.   finasteride  (PROSCAR ) 5 MG tablet Take 1 tablet (5 mg total) by mouth daily.    finasteride  (PROSCAR ) 5 MG tablet Take 1 tablet (5 mg total) by mouth daily.   fluconazole  (DIFLUCAN ) 100 MG tablet Take 1 tablet by mouth in the morning until complete   fluticasone  (FLONASE ) 50 MCG/ACT nasal spray Place 1 spray into both nostrils as needed for allergies.   gabapentin  (NEURONTIN ) 600 MG tablet Take 1 tablet (600 mg total) by mouth 3 (three) times daily.   gabapentin  (NEURONTIN ) 600 MG tablet Take 1 tablet (600 mg total) by mouth 3 (three) times daily.   hydrocortisone  (ANUSOL -HC) 25 MG suppository Use 1 suppository 2 times a day as needed.   hydrocortisone  (ANUSOL -HC) 25 MG suppository Use 1 suppository 2 times a day as needed rectally as directed.   HYDROmorphone  (DILAUDID ) 2 MG tablet Take 1 tablet (2 mg total) by mouth every 6 (six) hours as needed for severe pain (pain score 7-10) or moderate pain (pain score 4-6) (post-operatively).   HYDROmorphone  (DILAUDID ) 4 MG tablet Take 1 tablet (4 mg total) by mouth every 6 (six) hours as needed for pain.   Ipratropium-Albuterol  (COMBIVENT  RESPIMAT) 20-100 MCG/ACT AERS respimat Inhale 1 puff into the lungs as needed.   itraconazole  (SPORANOX ) 10 MG/ML solution Take 10 mLs (100 mg total) by mouth on empty stomach 2 (two) times daily.   ketoconazole  (NIZORAL ) 2 % cream Apply topically 2 (two) times daily. to affected area   lidocaine  (ANECREAM) 4 % cream Apply to affected area 4 times daily as needed (perianal pain).   lidocaine  (ANECREAM) 4 % cream Apply topically 4 (four) times daily as needed (perianal pain).   lidocaine  (LIDODERM ) 5 % APPLY 2 PATCHES TOPICALLY TO THE AFFECTED AREAS DAILY (12 HOURS ON AND 12 HOURS OFF). Externally   lidocaine  (LIDODERM ) 5 % APPLY 2 PATCHES TOPICALLY TO THE AFFECTED AREAS DAILY (12 HOURS ON AND 12 HOURS OFF). Externally   loratadine  (CLARITIN ) 10 MG tablet Take 10 mg by mouth daily as needed.   metFORMIN  (GLUCOPHAGE ) 1000 MG tablet Take 1 tablet (1,000 mg total) by mouth 2 (two) times daily.    methocarbamol  (ROBAXIN ) 500 MG tablet Take 1 tablet (500 mg total) by mouth 3 (three) times daily as needed for tearful muscle spasms and pain.   methocarbamol  (ROBAXIN ) 500 MG tablet Take 1 tablet (500 mg total) by mouth every 6 to 8 hours as needed for muscle spasms. (Patient taking differently: Take 500-1,000 mg by mouth See admin instructions. Take 1,000 mg by mouth at bedtime and an additional 500 mg up to two times a day as needed for muscle spasms)   methocarbamol  (ROBAXIN ) 500 MG tablet Take 1 tablet (500 mg total) by mouth 2 (two) times daily as needed.   methocarbamol  (ROBAXIN ) 500 MG tablet Take 1 tablet (500 mg total) by mouth 3 (three) times daily as needed for tearful muscle spasms and pain.   methocarbamol  (ROBAXIN ) 500 MG tablet Take 1 tablet (500 mg total) by mouth up to 3 (  three) times daily as needed for tearful muscle spasms and pain.   methocarbamol  (ROBAXIN ) 500 MG tablet Take 1 tablet (500 mg total) by mouth up to 3 (three) times daily as needed for tearful muscle spasms and pain. orally as directed   methocarbamol  (ROBAXIN ) 500 MG tablet Take 1 tablet (500 mg total) by mouth 2 (two) times daily as needed.   metoCLOPramide  (REGLAN ) 5 MG tablet Take 1 tablet (5 mg total) by mouth daily as needed 1 hour before bedtime 3 nights per week (after mounjaro ).   Multiple Vitamins-Minerals (AIRBORNE) CHEW Chew 3 tablets by mouth daily.   naloxegol  oxalate (MOVANTIK ) 25 MG TABS tablet Take 1 tablet (25 mg total) by mouth every morning.   naloxegol  oxalate (MOVANTIK ) 25 MG TABS tablet Take 1 tablet (25 mg total) by mouth every morning.   naloxegol  oxalate (MOVANTIK ) 25 MG TABS tablet Take 1 tablet (25 mg total) by mouth every morning.   naloxegol  oxalate (MOVANTIK ) 25 MG TABS tablet Take 1 tablet (25 mg total) by mouth in the morning.   naloxegol  oxalate (MOVANTIK ) 25 MG TABS tablet Take 1 tablet (25 mg total) by mouth every morning.   naloxone  (NARCAN ) nasal spray 4 mg/0.1 mL Place 1 spray  (4 mg total) into the nose daily as needed for respiratory arrest.   nystatin  (MYCOSTATIN ) 100000 UNIT/ML suspension Swish and swallow 4 mLs (400,000 Units total) by mouth 4 (four) times daily.   nystatin  (MYCOSTATIN ) 100000 UNIT/ML suspension Swish and swallow 4 mLs (400,000 Units total) by mouth 4 (four) times daily.   ondansetron  (ZOFRAN ) 4 MG tablet Take 1 tablet (4 mg total) by mouth 3 (three) times daily. (Patient taking differently: Take 4 mg by mouth every 8 (eight) hours as needed for nausea or vomiting.)   ondansetron  (ZOFRAN -ODT) 8 MG disintegrating tablet Dissolve 1 tablet in mouth every 8 hours as needed for nausea for 5 days.   OVER THE COUNTER MEDICATION Take 1 capsule by mouth 2 (two) times daily. Super Beta Prostate Supplement capsules.   oxyCODONE  (OXY IR/ROXICODONE ) 5 MG immediate release tablet Take 1-2 tablets (5-10 mg total) by mouth every 8 (eight) hours as needed for pain, caution sedation   oxyCODONE  (OXY IR/ROXICODONE ) 5 MG immediate release tablet Take 1-2 tablets (5-10 mg total) by mouth every 8 (eight) hours as needed for pain. May cause drowsiness. (Patient taking differently: Take 5-10 mg by mouth See admin instructions. Take 10 mg by mouth at bedtime and an additional 5-10 mg one to two times a day as needed for pain)   oxyCODONE  (OXY IR/ROXICODONE ) 5 MG immediate release tablet Take 1-2 tablets (5-10 mg total) by mouth every 8 (eight) hours as needed  for pain. (Caution with sedation)   oxyCODONE  (OXY IR/ROXICODONE ) 5 MG immediate release tablet Take 1-2 tablets (5-10 mg total) by mouth every 8 (eight) hours as needed for pain   oxyCODONE  (OXY IR/ROXICODONE ) 5 MG immediate release tablet Take 1 - 2 tablets by mouth every 8 hours as needed for pain. caution with sedation.   oxyCODONE  (OXY IR/ROXICODONE ) 5 MG immediate release tablet Take 1-2 tablets (5-10 mg total) by mouth every 8 (eight) hours as needed for pain. may cause drowsiness   oxymetazoline  (AFRIN) 0.05 % nasal  spray Place 2 sprays into both nostrils 2 (two) times daily as needed for congestion.    pantoprazole  (PROTONIX ) 40 MG tablet Take 1 tablet (40 mg total) by mouth daily.   pantoprazole  (PROTONIX ) 40 MG tablet Take 1 tablet (40  mg total) by mouth 2 (two) times daily.   pantoprazole  (PROTONIX ) 40 MG tablet Take 1 tablet (40 mg total) by mouth daily.   pantoprazole  (PROTONIX ) 40 MG tablet Take 1 tablet (40 mg total) by mouth daily.   penicillin  v potassium (VEETID) 500 MG tablet Take 1 tablet (500 mg total) by mouth 4 (four) times daily until complete.   polyethylene glycol (MIRALAX  / GLYCOLAX ) 17 g packet Take 17 g by mouth daily. (Patient taking differently: Take 17 g by mouth daily as needed for moderate constipation.)   Psyllium (METAMUCIL) 28.3 % POWD Mix 3 teaspoonsful of powder into the appropriate amount of water  and drink once a day. (Patient taking differently: Take 1 Scoop by mouth daily as needed (constipation).)   Respiratory Therapy Supplies (SPIROMETER) KIT Use 3 times daily   Rimegepant Sulfate  (NURTEC) 75 MG TBDP Place 1 tablet on the tongue and dissolve daily as needed   Rimegepant Sulfate  (NURTEC) 75 MG TBDP Place 1 tablet on the tongue and allow to dissolve once daily as needed.   Rimegepant Sulfate  (NURTEC) 75 MG TBDP Let 1 tablet (75 mg total) dissolve by mouth daily as needed.   Rimegepant Sulfate  (NURTEC) 75 MG TBDP Place 1 tablet (75 mg total) on the tongue and allow to dissolve once daily as needed.   Rimegepant Sulfate  (NURTEC) 75 MG TBDP Place 1 tablet on the tongue and allow to dissolve once daily as needed   rivaroxaban  (XARELTO ) 20 MG TABS tablet Take 1 tablet (20 mg total) by mouth daily with food.   rizatriptan  (MAXALT -MLT) 5 MG disintegrating tablet Dissolve 1 tablet by mouth at the onset of headache, may repeat in 2 hours, max daily amount: 6 tablets   rizatriptan  (MAXALT -MLT) 5 MG disintegrating tablet DISSOLVE ONE TABLET BY MOUTH AT ONSET OF HEADACHE MAY REPEAT IN 2  HOURS (MAX 6 TABLETS PER DAY)   rizatriptan  (MAXALT -MLT) 5 MG disintegrating tablet DISSOLVE ONE TABLET BY MOUTH AT ONSET OF HEADACHE MAY REPEAT IN 2 HOURS (MAX 6 TABLETS PER DAY)   rosuvastatin  (CRESTOR ) 20 MG tablet Take 1 tablet (20 mg total) by mouth daily.   rosuvastatin  (CRESTOR ) 20 MG tablet Take 1 tablet (20 mg total) by mouth daily.   rosuvastatin  (CRESTOR ) 20 MG tablet Take 1 tablet (20 mg total) by mouth daily.   rosuvastatin  (CRESTOR ) 20 MG tablet Take 1 tablet (20 mg total) by mouth daily.   senna (SENOKOT) 8.6 MG TABS tablet Take 1-2 tablets (8.6-17.2 mg total) by mouth daily as needed for mild constipation.   tadalafil  (CIALIS ) 5 MG tablet Take 1-3 tablets (5-15 mg total) by mouth daily for urinary and sexual function   tadalafil  (CIALIS ) 5 MG tablet Take 1 tablet (5 mg total) by mouth daily for benign prostatic hypertrophy   tadalafil  (CIALIS ) 5 MG tablet Take 1 tablet (5 mg total) by mouth daily for benign prostatic hypertrophy   tadalafil  (CIALIS ) 5 MG tablet TAKE 1 TABLET BY MOUTH ONCE DAILY FOR BENIGN PROSTATIC HYPERTROPHY   tirzepatide  (MOUNJARO ) 10 MG/0.5ML Pen Inject 10 mg into the skin once a week.   tirzepatide  (MOUNJARO ) 10 MG/0.5ML Pen Inject 10 mg into the skin once a week.   tirzepatide  (MOUNJARO ) 10 MG/0.5ML Pen Inject 10 mg into the skin once a week.   topiramate  (TOPAMAX ) 25 MG tablet Take 1 tablet (25 mg total) by mouth at bedtime..(to prevent headache)   topiramate  (TOPAMAX ) 25 MG tablet Take 1 tablet (25 mg total) by mouth at bedtime to prevent  headaches.   topiramate  (TOPAMAX ) 25 MG tablet Take 1 tablet (25 mg total) by mouth at bedtime.(to prevent headache)   traMADol  (ULTRAM ) 50 MG tablet Take 1-2 tablets (50-100 mg total) by mouth every 6 (six) hours as needed for pain, not to exceed 8 tablets per day   traMADol  (ULTRAM ) 50 MG tablet Take 1-2 tablets (50-100 mg total) by mouth every 6 (six) hours as needed for pain. Max of 8 tablets per day.   traMADol   (ULTRAM ) 50 MG tablet Take 1-2 tablets (50-100 mg total) by mouth every 6 (six) hours as needed for pain. (Not to exceed 8 tablets per day)   traMADol  (ULTRAM ) 50 MG tablet Take 1-2 tablets (50-100 mg total) by mouth every 6 (six) hours as needed for pain. max 8 tablets daily   VOLTAREN  1 % GEL Apply 2-4 g topically See admin instructions. Apply 2-4 grams to affected area 4 times a day   zolpidem  (AMBIEN ) 10 MG tablet Take 1 tablet (10 mg total) by mouth at bedtime as needed for sleep   zolpidem  (AMBIEN ) 10 MG tablet Take 1 tablet (10 mg total) by mouth at bedtime as needed for sleep.   zolpidem  (AMBIEN ) 10 MG tablet Take one tablet by mouth at bedtime as needed for sleep   zolpidem  (AMBIEN ) 10 MG tablet Take 1 tablet (10 mg total) by mouth at bedtime as needed for sleep.   zolpidem  (AMBIEN ) 10 MG tablet Take 1 tablet (10 mg total) by mouth every evening as needed for sleep   ciprofloxacin  (CIPRO ) 500 MG tablet Take 1 tablet (500 mg total) by mouth 2 (two) times daily for 14 days. (Patient not taking: Reported on 08/25/2024)   sulfamethoxazole -trimethoprim  (BACTRIM  DS) 800-160 MG tablet Take 1 tablet by mouth daily. To prevent infection with catheter in place (Patient not taking: Reported on 08/25/2024)   tamsulosin  (FLOMAX ) 0.4 MG CAPS capsule Take 0.4 mg by mouth 2 (two) times daily. (Patient not taking: Reported on 08/25/2024)   [DISCONTINUED] pravastatin  (PRAVACHOL ) 40 MG tablet Take 1 tablet (40 mg total) by mouth daily. (Patient taking differently: Take 40 mg by mouth in the morning.)   No facility-administered encounter medications on file as of 08/25/2024.     Patient Active Problem List   Diagnosis Date Noted   Chronic fatigue syndrome 08/23/2024   Chronic anticoagulation 08/23/2024   Chronic sinusitis 08/23/2024   Primary osteoarthritis of both knees 08/23/2024   Prostatic hyperplasia 08/12/2023   Rectal bleed 11/10/2021   BRBPR (bright red blood per rectum) 11/09/2021   Controlled  type 2 diabetes mellitus without complication, without long-term current use of insulin  (HCC) 11/09/2021   Hyperlipidemia 11/09/2021   Chronic musculoskeletal pain 11/09/2021   Oropharyngeal candidiasis 11/09/2021   Hoarse voice quality 11/09/2021   Near syncope 11/09/2021   Acute lower GI bleeding 11/08/2021   Right shoulder injury 06/15/2018   S/P arthroscopy of right shoulder 06/15/2018   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   OA (osteoarthritis) of knee 12/08/2016   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 without status migrainosus 12/17/2015   Lumbar radiculopathy 12/17/2015   Chronic pain 12/17/2015   Primary osteoarthritis of left knee 12/17/2015   Presence of unspecified artificial hip joint 07/09/2015   Peripheral angiopathy due to type 2 diabetes mellitus (HCC) 11/16/2013     Health Maintenance Due  Topic Date Due   FOOT EXAM  Never done   Diabetic kidney evaluation - Urine ACR  Never done   Hepatitis C Screening  Never done   Hepatitis B Vaccines 19-59 Average Risk (3 of 3 - 19+ 3-dose series) 05/15/1995   Pneumococcal Vaccine: 50+ Years (2 of 2 - PCV) 05/19/2013   OPHTHALMOLOGY EXAM  02/26/2018   HEMOGLOBIN A1C  05/05/2022   Diabetic kidney evaluation - eGFR measurement  08/12/2024     Review of Systems 12 point ros is negative other than what is mentioned above Physical Exam   BP 121/81   Pulse 71   Temp 98.9 F (37.2 C) (Oral)   Ht 6' (1.829 m)   Wt 256 lb (116.1 kg)   SpO2 97%   BMI 34.72 kg/m    Physical Exam  Constitutional: He is oriented to person, place, and time. He appears well-developed and well-nourished. No distress.  HENT:  Mouth/Throat: Oropharynx is clear and moist. + oropharyngeal exudate.  Lymphadenopathy:  He has no cervical adenopathy.  Neurological: He is alert and oriented to person, place, and time.  Skin: Skin is warm and dry. No rash noted. No erythema.  Psychiatric: He has a normal mood and affect. His behavior is  normal.    CBC Lab Results  Component Value Date   WBC 9.5 11/11/2021   RBC 4.52 11/11/2021   HGB 13.1 08/13/2023   HCT 40.3 08/13/2023   PLT 379 11/11/2021   MCV 96.2 11/11/2021   MCH 31.4 11/11/2021   MCHC 32.6 11/11/2021   RDW 14.7 11/11/2021   LYMPHSABS 2.1 11/08/2021   MONOABS 0.9 11/08/2021   EOSABS 0.3 11/08/2021    BMET Lab Results  Component Value Date   NA 140 08/13/2023   K 4.1 08/13/2023   CL 110 08/13/2023   CO2 24 08/13/2023   GLUCOSE 137 (H) 08/13/2023   BUN 12 08/13/2023   CREATININE 0.77 08/13/2023   CALCIUM  8.5 (L) 08/13/2023   GFRNONAA >60 08/13/2023   GFRAA 95 11/12/2020      Assessment and Plan  Oral candidiasis = agree that this likely due to use of GLP-1 indirectly altering mouth flora as results of worsening GERD. He possibly was under dosed with fluconazole  vs. Could this be candidal species now R to azoles. +/- mild related esophagitis  - will do a trial of high dose fluconazole  that would also treat eso candidiasis. Will do fluc 400mg  po daily x 14d - will check sed rate and crp - will collect fungal culture to see if can isolate candidal species. And decide if need to send for sensitivities  Drug interactions = reviewed his med list, still safe with xarelto  as discussed with pharmacist  evaluation of this patient requires complex antimicrobial therapy evaluation and counseling and isolation needs for disease transmission risk assessment and mitigation.   I personally spent a total of 40 minutes in the care of the patient today including preparing to see the patient, getting/reviewing separately obtained history, performing a medically appropriate exam/evaluation, counseling and educating, placing orders, referring and communicating with other health care professionals, documenting clinical information in the EHR, independently interpreting results, and communicating results.

## 2024-08-26 ENCOUNTER — Other Ambulatory Visit (HOSPITAL_COMMUNITY): Payer: Self-pay

## 2024-08-26 LAB — COMPREHENSIVE METABOLIC PANEL WITH GFR
AG Ratio: 1.5 (calc) (ref 1.0–2.5)
ALT: 10 U/L (ref 9–46)
AST: 9 U/L — ABNORMAL LOW (ref 10–35)
Albumin: 4.1 g/dL (ref 3.6–5.1)
Alkaline phosphatase (APISO): 71 U/L (ref 35–144)
BUN: 12 mg/dL (ref 7–25)
CO2: 27 mmol/L (ref 20–32)
Calcium: 9.7 mg/dL (ref 8.6–10.3)
Chloride: 108 mmol/L (ref 98–110)
Creat: 0.95 mg/dL (ref 0.70–1.35)
Globulin: 2.7 g/dL (ref 1.9–3.7)
Glucose, Bld: 84 mg/dL (ref 65–99)
Potassium: 4.6 mmol/L (ref 3.5–5.3)
Sodium: 141 mmol/L (ref 135–146)
Total Bilirubin: 0.3 mg/dL (ref 0.2–1.2)
Total Protein: 6.8 g/dL (ref 6.1–8.1)
eGFR: 89 mL/min/1.73m2 (ref >=60)

## 2024-08-27 ENCOUNTER — Other Ambulatory Visit (HOSPITAL_COMMUNITY): Payer: Self-pay

## 2024-08-28 ENCOUNTER — Other Ambulatory Visit (HOSPITAL_COMMUNITY): Payer: Self-pay

## 2024-08-29 ENCOUNTER — Other Ambulatory Visit (HOSPITAL_COMMUNITY): Payer: Self-pay

## 2024-08-30 ENCOUNTER — Other Ambulatory Visit (HOSPITAL_COMMUNITY): Payer: Self-pay

## 2024-08-30 ENCOUNTER — Ambulatory Visit: Payer: Self-pay | Admitting: Internal Medicine

## 2024-09-05 LAB — FUNGUS CULTURE W SMEAR
MICRO NUMBER:: 17232340
SMEAR:: NONE SEEN
SPECIMEN QUALITY:: ADEQUATE

## 2024-09-07 ENCOUNTER — Other Ambulatory Visit (HOSPITAL_COMMUNITY): Payer: Self-pay

## 2024-09-07 ENCOUNTER — Other Ambulatory Visit: Payer: Self-pay

## 2024-09-07 ENCOUNTER — Ambulatory Visit: Admitting: Internal Medicine

## 2024-09-07 ENCOUNTER — Encounter: Payer: Self-pay | Admitting: Internal Medicine

## 2024-09-07 VITALS — BP 138/84 | HR 82 | Temp 98.6°F | Wt 248.0 lb

## 2024-09-07 DIAGNOSIS — B370 Candidal stomatitis: Secondary | ICD-10-CM

## 2024-09-07 NOTE — Progress Notes (Signed)
 RFV: follow up for thrush Patient ID: Daniel JINNY Ill, MD, male   DOB: 1961-02-04, 63 y.o.   MRN: [id number]  HPI 63yo M with thrush --> has finished up fluconazole . He feels better, taste improving. Still noticing discoloration of tongue.  No other changes in health since we last saw him.  Outpatient Encounter Medications as of 09/07/2024  Medication Sig   amoxicillin  (AMOXIL ) 500 MG capsule Take 4 capsules (2,000 mg total) by mouth 1 hour prior to surgery   amoxicillin  (AMOXIL ) 500 MG capsule Take 4 capsules by mouth 1 hour before dental proceedure   amoxicillin -clavulanate (AUGMENTIN ) 500-125 MG tablet Take 1 tablet by mouth 2 (two) times daily for suspected cystitis   amoxicillin -clavulanate (AUGMENTIN ) 875-125 MG tablet Take 1 tablet by mouth 2 (two) times daily.   baclofen  (LIORESAL ) 10 MG tablet Take 1-2 tablets (10-20 mg total) by mouth 2 (two) times daily as needed.   baclofen  (LIORESAL ) 10 MG tablet Take 1-2 tablets (10-20 mg total) by mouth 2 (two) times daily as needed.   baclofen  (LIORESAL ) 10 MG tablet Take 1-2 tablets (10-20 mg total) by mouth 2 (two) times daily as needed.   baclofen  (LIORESAL ) 10 MG tablet Take 1-2 tablets (10-20 mg total) by mouth 2 (two) times daily as needed.   butalbital -acetaminophen -caffeine  (FIORICET ) 50-325-40 MG tablet Take 1-2 tablets by mouth every 4-6 hours as needed (max 6 tabs in 24 hours)   butalbital -acetaminophen -caffeine  (FIORICET ) 50-325-40 MG tablet Take 1-2 tablets by mouth every 4-6 hours as needed.  Max 6 tablets/24 hours.   butalbital -acetaminophen -caffeine  (FIORICET ) 50-325-40 MG tablet Take 1-2 tablets by mouth every 4-6 hours as needed.  Max 6 tab/24 hrs.   butalbital -acetaminophen -caffeine  (FIORICET ) 50-325-40 MG tablet Take 1-2 tablets by mouth every 4 to 6 hours as needed. max 6 tablets in 24 hours   ciprofloxacin  (CIPRO ) 500 MG tablet Take 1 tablet (500 mg total) by mouth 2 (two) times daily for 14 days.   cyclobenzaprine   (FLEXERIL ) 10 MG tablet Take 1 tablet (10 mg total) by mouth 3 (three) times daily as needed.   cyclobenzaprine  (FLEXERIL ) 10 MG tablet Take 1 tablet (10 mg total) by mouth 3 (three) times daily as needed.   cyclobenzaprine  (FLEXERIL ) 10 MG tablet Take 1 tablet (10 mg total) by mouth 3 (three) times daily as needed.   cyclobenzaprine  (FLEXERIL ) 10 MG tablet Take 1 tablet (10 mg total) by mouth 3 (three) times daily as needed.   diazepam  (VALIUM ) 5 MG tablet Take 1 tablet (5 mg total) by mouth 2 (two) times daily.   diazepam  (VALIUM ) 5 MG tablet Take 1 tablet (5 mg total) by mouth 2 (two) times daily, (every 12 hours.)   diazepam  (VALIUM ) 5 MG tablet Take 1 tablet (5 mg total) by mouth every 12 (twelve) hours.   diclofenac  Sodium (VOLTAREN ) 1 % GEL Apply 1 gram to affected area three times daily as needed.   diclofenac  Sodium (VOLTAREN ) 1 % GEL Apply 1 gram to affected area three times daily as needed.   dicyclomine  (BENTYL ) 20 MG tablet Take 1 tablet by mouth every 4 hours as needed   finasteride  (PROSCAR ) 5 MG tablet Take 1 tablet (5 mg total) by mouth daily.   finasteride  (PROSCAR ) 5 MG tablet Take 1 tablet (5 mg total) by mouth daily.   finasteride  (PROSCAR ) 5 MG tablet Take 1 tablet (5 mg total) by mouth daily.   fluconazole  (DIFLUCAN ) 200 MG tablet Take 2 tablets (400 mg total) by mouth daily.  fluticasone  (FLONASE ) 50 MCG/ACT nasal spray Place 1 spray into both nostrils as needed for allergies.   gabapentin  (NEURONTIN ) 600 MG tablet Take 1 tablet (600 mg total) by mouth 3 (three) times daily.   gabapentin  (NEURONTIN ) 600 MG tablet Take 1 tablet (600 mg total) by mouth 3 (three) times daily.   hydrocortisone  (ANUSOL -HC) 25 MG suppository Use 1 suppository 2 times a day as needed.   hydrocortisone  (ANUSOL -HC) 25 MG suppository Use 1 suppository 2 times a day as needed rectally as directed.   HYDROmorphone  (DILAUDID ) 2 MG tablet Take 1 tablet (2 mg total) by mouth every 6 (six) hours as needed  for severe pain (pain score 7-10) or moderate pain (pain score 4-6) (post-operatively).   HYDROmorphone  (DILAUDID ) 4 MG tablet Take 1 tablet (4 mg total) by mouth every 6 (six) hours as needed for pain.   Ipratropium-Albuterol  (COMBIVENT  RESPIMAT) 20-100 MCG/ACT AERS respimat Inhale 1 puff into the lungs as needed.   itraconazole  (SPORANOX ) 10 MG/ML solution Take 10 mLs (100 mg total) by mouth on empty stomach 2 (two) times daily.   ketoconazole  (NIZORAL ) 2 % cream Apply topically 2 (two) times daily. to affected area   lidocaine  (ANECREAM) 4 % cream Apply to affected area 4 times daily as needed (perianal pain).   lidocaine  (ANECREAM) 4 % cream Apply topically 4 (four) times daily as needed (perianal pain).   lidocaine  (LIDODERM ) 5 % APPLY 2 PATCHES TOPICALLY TO THE AFFECTED AREAS DAILY (12 HOURS ON AND 12 HOURS OFF). Externally   lidocaine  (LIDODERM ) 5 % APPLY 2 PATCHES TOPICALLY TO THE AFFECTED AREAS DAILY (12 HOURS ON AND 12 HOURS OFF). Externally   loperamide  (IMODIUM ) 2 MG capsule Take 1 capsule by mouth four times daily as needed   loratadine  (CLARITIN ) 10 MG tablet Take 10 mg by mouth daily as needed.   metFORMIN  (GLUCOPHAGE ) 1000 MG tablet Take 1 tablet (1,000 mg total) by mouth 2 (two) times daily.   methocarbamol  (ROBAXIN ) 500 MG tablet Take 1 tablet (500 mg total) by mouth 3 (three) times daily as needed for tearful muscle spasms and pain.   methocarbamol  (ROBAXIN ) 500 MG tablet Take 1 tablet (500 mg total) by mouth every 6 to 8 hours as needed for muscle spasms. (Patient taking differently: Take 500-1,000 mg by mouth See admin instructions. Take 1,000 mg by mouth at bedtime and an additional 500 mg up to two times a day as needed for muscle spasms)   methocarbamol  (ROBAXIN ) 500 MG tablet Take 1 tablet (500 mg total) by mouth 2 (two) times daily as needed.   methocarbamol  (ROBAXIN ) 500 MG tablet Take 1 tablet (500 mg total) by mouth 3 (three) times daily as needed for tearful muscle spasms  and pain.   methocarbamol  (ROBAXIN ) 500 MG tablet Take 1 tablet (500 mg total) by mouth up to 3 (three) times daily as needed for tearful muscle spasms and pain.   methocarbamol  (ROBAXIN ) 500 MG tablet Take 1 tablet (500 mg total) by mouth up to 3 (three) times daily as needed for tearful muscle spasms and pain. orally as directed   methocarbamol  (ROBAXIN ) 500 MG tablet Take 1 tablet (500 mg total) by mouth 2 (two) times daily as needed.   metoCLOPramide  (REGLAN ) 5 MG tablet Take 1 tablet (5 mg total) by mouth daily as needed 1 hour before bedtime 3 nights per week (after mounjaro ).   Multiple Vitamins-Minerals (AIRBORNE) CHEW Chew 3 tablets by mouth daily.   naloxegol  oxalate (MOVANTIK ) 25 MG TABS tablet  Take 1 tablet (25 mg total) by mouth every morning.   naloxegol  oxalate (MOVANTIK ) 25 MG TABS tablet Take 1 tablet (25 mg total) by mouth every morning.   naloxegol  oxalate (MOVANTIK ) 25 MG TABS tablet Take 1 tablet (25 mg total) by mouth every morning.   naloxegol  oxalate (MOVANTIK ) 25 MG TABS tablet Take 1 tablet (25 mg total) by mouth in the morning.   naloxegol  oxalate (MOVANTIK ) 25 MG TABS tablet Take 1 tablet (25 mg total) by mouth every morning.   naloxone  (NARCAN ) nasal spray 4 mg/0.1 mL Place 1 spray (4 mg total) into the nose daily as needed for respiratory arrest.   nystatin  (MYCOSTATIN ) 100000 UNIT/ML suspension Swish and swallow 4 mLs (400,000 Units total) by mouth 4 (four) times daily.   nystatin  (MYCOSTATIN ) 100000 UNIT/ML suspension Swish and swallow 4 mLs (400,000 Units total) by mouth 4 (four) times daily.   ondansetron  (ZOFRAN ) 4 MG tablet Take 1 tablet (4 mg total) by mouth 3 (three) times daily. (Patient taking differently: Take 4 mg by mouth every 8 (eight) hours as needed for nausea or vomiting.)   ondansetron  (ZOFRAN -ODT) 8 MG disintegrating tablet Dissolve 1 tablet in mouth every 8 hours as needed for nausea for 5 days.   OVER THE COUNTER MEDICATION Take 1 capsule by mouth 2  (two) times daily. Super Beta Prostate Supplement capsules.   oxyCODONE  (OXY IR/ROXICODONE ) 5 MG immediate release tablet Take 1-2 tablets (5-10 mg total) by mouth every 8 (eight) hours as needed for pain, caution sedation   oxyCODONE  (OXY IR/ROXICODONE ) 5 MG immediate release tablet Take 1-2 tablets (5-10 mg total) by mouth every 8 (eight) hours as needed for pain. May cause drowsiness. (Patient taking differently: Take 5-10 mg by mouth See admin instructions. Take 10 mg by mouth at bedtime and an additional 5-10 mg one to two times a day as needed for pain)   oxyCODONE  (OXY IR/ROXICODONE ) 5 MG immediate release tablet Take 1-2 tablets (5-10 mg total) by mouth every 8 (eight) hours as needed  for pain. (Caution with sedation)   oxyCODONE  (OXY IR/ROXICODONE ) 5 MG immediate release tablet Take 1-2 tablets (5-10 mg total) by mouth every 8 (eight) hours as needed for pain   oxyCODONE  (OXY IR/ROXICODONE ) 5 MG immediate release tablet Take 1 - 2 tablets by mouth every 8 hours as needed for pain. caution with sedation.   oxyCODONE  (OXY IR/ROXICODONE ) 5 MG immediate release tablet Take 1-2 tablets (5-10 mg total) by mouth every 8 (eight) hours as needed for pain. may cause drowsiness   oxymetazoline  (AFRIN) 0.05 % nasal spray Place 2 sprays into both nostrils 2 (two) times daily as needed for congestion.    pantoprazole  (PROTONIX ) 40 MG tablet Take 1 tablet (40 mg total) by mouth daily.   pantoprazole  (PROTONIX ) 40 MG tablet Take 1 tablet (40 mg total) by mouth 2 (two) times daily.   pantoprazole  (PROTONIX ) 40 MG tablet Take 1 tablet (40 mg total) by mouth daily.   pantoprazole  (PROTONIX ) 40 MG tablet Take 1 tablet (40 mg total) by mouth daily.   penicillin  v potassium (VEETID) 500 MG tablet Take 1 tablet (500 mg total) by mouth 4 (four) times daily until complete.   polyethylene glycol (MIRALAX  / GLYCOLAX ) 17 g packet Take 17 g by mouth daily. (Patient taking differently: Take 17 g by mouth daily as needed  for moderate constipation.)   Psyllium (METAMUCIL) 28.3 % POWD Mix 3 teaspoonsful of powder into the appropriate amount of water  and drink  once a day. (Patient taking differently: Take 1 Scoop by mouth daily as needed (constipation).)   Respiratory Therapy Supplies (SPIROMETER) KIT Use 3 times daily   Rimegepant Sulfate  (NURTEC) 75 MG TBDP Place 1 tablet on the tongue and dissolve daily as needed   Rimegepant Sulfate  (NURTEC) 75 MG TBDP Place 1 tablet on the tongue and allow to dissolve once daily as needed.   Rimegepant Sulfate  (NURTEC) 75 MG TBDP Let 1 tablet (75 mg total) dissolve by mouth daily as needed.   Rimegepant Sulfate  (NURTEC) 75 MG TBDP Place 1 tablet (75 mg total) on the tongue and allow to dissolve once daily as needed.   Rimegepant Sulfate  (NURTEC) 75 MG TBDP Place 1 tablet on the tongue and allow to dissolve once daily as needed   rivaroxaban  (XARELTO ) 20 MG TABS tablet Take 1 tablet (20 mg total) by mouth daily with food.   rizatriptan  (MAXALT -MLT) 5 MG disintegrating tablet Dissolve 1 tablet by mouth at the onset of headache, may repeat in 2 hours, max daily amount: 6 tablets   rizatriptan  (MAXALT -MLT) 5 MG disintegrating tablet DISSOLVE ONE TABLET BY MOUTH AT ONSET OF HEADACHE MAY REPEAT IN 2 HOURS (MAX 6 TABLETS PER DAY)   rizatriptan  (MAXALT -MLT) 5 MG disintegrating tablet DISSOLVE ONE TABLET BY MOUTH AT ONSET OF HEADACHE MAY REPEAT IN 2 HOURS (MAX 6 TABLETS PER DAY)   rosuvastatin  (CRESTOR ) 20 MG tablet Take 1 tablet (20 mg total) by mouth daily.   rosuvastatin  (CRESTOR ) 20 MG tablet Take 1 tablet (20 mg total) by mouth daily.   rosuvastatin  (CRESTOR ) 20 MG tablet Take 1 tablet (20 mg total) by mouth daily.   rosuvastatin  (CRESTOR ) 20 MG tablet Take 1 tablet (20 mg total) by mouth daily.   senna (SENOKOT) 8.6 MG TABS tablet Take 1-2 tablets (8.6-17.2 mg total) by mouth daily as needed for mild constipation.   sulfamethoxazole -trimethoprim  (BACTRIM  DS) 800-160 MG tablet Take 1  tablet by mouth daily. To prevent infection with catheter in place   tadalafil  (CIALIS ) 5 MG tablet Take 1-3 tablets (5-15 mg total) by mouth daily for urinary and sexual function   tadalafil  (CIALIS ) 5 MG tablet Take 1 tablet (5 mg total) by mouth daily for benign prostatic hypertrophy   tadalafil  (CIALIS ) 5 MG tablet Take 1 tablet (5 mg total) by mouth daily for benign prostatic hypertrophy   tadalafil  (CIALIS ) 5 MG tablet TAKE 1 TABLET BY MOUTH ONCE DAILY FOR BENIGN PROSTATIC HYPERTROPHY   tamsulosin  (FLOMAX ) 0.4 MG CAPS capsule Take 0.4 mg by mouth 2 (two) times daily.   tirzepatide  (MOUNJARO ) 10 MG/0.5ML Pen Inject 10 mg into the skin once a week.   tirzepatide  (MOUNJARO ) 10 MG/0.5ML Pen Inject 10 mg into the skin once a week.   tirzepatide  (MOUNJARO ) 10 MG/0.5ML Pen Inject 10 mg into the skin once a week.   topiramate  (TOPAMAX ) 25 MG tablet Take 1 tablet (25 mg total) by mouth at bedtime..(to prevent headache)   topiramate  (TOPAMAX ) 25 MG tablet Take 1 tablet (25 mg total) by mouth at bedtime to prevent headaches.   topiramate  (TOPAMAX ) 25 MG tablet Take 1 tablet (25 mg total) by mouth at bedtime.(to prevent headache)   traMADol  (ULTRAM ) 50 MG tablet Take 1-2 tablets (50-100 mg total) by mouth every 6 (six) hours as needed for pain, not to exceed 8 tablets per day   traMADol  (ULTRAM ) 50 MG tablet Take 1-2 tablets (50-100 mg total) by mouth every 6 (six) hours as needed for pain. Max of  8 tablets per day.   traMADol  (ULTRAM ) 50 MG tablet Take 1-2 tablets (50-100 mg total) by mouth every 6 (six) hours as needed for pain. (Not to exceed 8 tablets per day)   traMADol  (ULTRAM ) 50 MG tablet Take 1-2 tablets (50-100 mg total) by mouth every 6 (six) hours as needed for pain. max 8 tablets daily   VOLTAREN  1 % GEL Apply 2-4 g topically See admin instructions. Apply 2-4 grams to affected area 4 times a day   zolpidem  (AMBIEN ) 10 MG tablet Take 1 tablet (10 mg total) by mouth at bedtime as needed for  sleep   zolpidem  (AMBIEN ) 10 MG tablet Take 1 tablet (10 mg total) by mouth at bedtime as needed for sleep.   zolpidem  (AMBIEN ) 10 MG tablet Take one tablet by mouth at bedtime as needed for sleep   zolpidem  (AMBIEN ) 10 MG tablet Take 1 tablet (10 mg total) by mouth at bedtime as needed for sleep.   zolpidem  (AMBIEN ) 10 MG tablet Take 1 tablet (10 mg total) by mouth every evening as needed for sleep   [DISCONTINUED] pravastatin  (PRAVACHOL ) 40 MG tablet Take 1 tablet (40 mg total) by mouth daily. (Patient taking differently: Take 40 mg by mouth in the morning.)   No facility-administered encounter medications on file as of 09/07/2024.     Patient Active Problem List   Diagnosis Date Noted   Chronic fatigue syndrome 08/23/2024   Chronic anticoagulation 08/23/2024   Chronic sinusitis 08/23/2024   Primary osteoarthritis of both knees 08/23/2024   Prostatic hyperplasia 08/12/2023   Rectal bleed 11/10/2021   BRBPR (bright red blood per rectum) 11/09/2021   Controlled type 2 diabetes mellitus without complication, without long-term current use of insulin  (HCC) 11/09/2021   Hyperlipidemia 11/09/2021   Chronic musculoskeletal pain 11/09/2021   Oropharyngeal candidiasis 11/09/2021   Hoarse voice quality 11/09/2021   Near syncope 11/09/2021   Acute lower GI bleeding 11/08/2021   Right shoulder injury 06/15/2018   S/P arthroscopy of right shoulder 06/15/2018   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   OA (osteoarthritis) of knee 12/08/2016   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 without status migrainosus 12/17/2015   Lumbar radiculopathy 12/17/2015   Chronic pain 12/17/2015   Primary osteoarthritis of left knee 12/17/2015   Presence of unspecified artificial hip joint 07/09/2015   Peripheral angiopathy due to type 2 diabetes mellitus (HCC) 11/16/2013     Health Maintenance Due  Topic Date Due   FOOT EXAM  Never done   Diabetic kidney evaluation - Urine ACR  Never done    Hepatitis C Screening  Never done   Hepatitis B Vaccines 19-59 Average Risk (3 of 3 - 19+ 3-dose series) 05/15/1995   Pneumococcal Vaccine: 50+ Years (2 of 2 - PCV) 05/19/2013   OPHTHALMOLOGY EXAM  02/26/2018   HEMOGLOBIN A1C  05/05/2022     Review of Systems 12 point ros is otherwise negative  Physical Exam   Wt 248 lb (112.5 kg)   BMI 33.63 kg/m   Physical Exam  Constitutional: He is oriented to person, place, and time. He appears well-developed and well-nourished. No distress.  HENT:  Mouth/Throat: Oropharynx is clear and moist. No oropharyngeal exudate.  Neurological: He is alert and oriented to person, place, and time.  Skin: Skin is warm and dry. No rash noted. No erythema.  Psychiatric: He has a normal mood and affect. His behavior is normal.    CBC Lab Results  Component Value Date   WBC  9.5 11/11/2021   RBC 4.52 11/11/2021   HGB 13.1 08/13/2023   HCT 40.3 08/13/2023   PLT 379 11/11/2021   MCV 96.2 11/11/2021   MCH 31.4 11/11/2021   MCHC 32.6 11/11/2021   RDW 14.7 11/11/2021   LYMPHSABS 2.1 11/08/2021   MONOABS 0.9 11/08/2021   EOSABS 0.3 11/08/2021    BMET Lab Results  Component Value Date   NA 141 08/25/2024   K 4.6 08/25/2024   CL 108 08/25/2024   CO2 27 08/25/2024   GLUCOSE 84 08/25/2024   BUN 12 08/25/2024   CREATININE 0.95 08/25/2024   CALCIUM  9.7 08/25/2024   GFRNONAA >60 08/13/2023   GFRAA 95 11/12/2020      Assessment and Plan  Thrush = has just finished course of fluconazole . Now we will watch off of treatment to see if has recurrence. If recurs may need to consider that he has azole resistant candidal strain and may need to use echinocandins.  Rtc if needed

## 2024-09-09 ENCOUNTER — Other Ambulatory Visit (HOSPITAL_COMMUNITY): Payer: Self-pay

## 2024-09-10 ENCOUNTER — Other Ambulatory Visit (HOSPITAL_COMMUNITY): Payer: Self-pay

## 2024-09-22 ENCOUNTER — Other Ambulatory Visit (HOSPITAL_COMMUNITY): Payer: Self-pay

## 2024-09-22 ENCOUNTER — Other Ambulatory Visit: Payer: Self-pay

## 2024-09-23 ENCOUNTER — Other Ambulatory Visit (HOSPITAL_COMMUNITY): Payer: Self-pay

## 2024-09-27 ENCOUNTER — Other Ambulatory Visit (HOSPITAL_COMMUNITY): Payer: Self-pay

## 2024-09-27 ENCOUNTER — Encounter: Payer: Self-pay | Admitting: Physical Medicine & Rehabilitation

## 2024-09-27 ENCOUNTER — Encounter: Attending: Physical Medicine & Rehabilitation | Admitting: Physical Medicine & Rehabilitation

## 2024-09-27 VITALS — BP 115/73 | HR 94 | Ht 72.0 in | Wt 252.0 lb

## 2024-09-27 DIAGNOSIS — G43719 Chronic migraine without aura, intractable, without status migrainosus: Secondary | ICD-10-CM

## 2024-09-27 MED ORDER — ONABOTULINUMTOXINA 100 UNITS IJ SOLR
200.0000 [IU] | Freq: Once | INTRAMUSCULAR | Status: AC
  Administered 2024-09-27: 12:00:00 200 [IU] via INTRAMUSCULAR

## 2024-09-27 MED ORDER — SODIUM CHLORIDE (PF) 0.9 % IJ SOLN
4.0000 mL | Freq: Once | INTRAMUSCULAR | Status: AC
  Administered 2024-09-27: 12:00:00 4 mL via INTRAVENOUS

## 2024-09-27 NOTE — Patient Instructions (Signed)
 Botox  injection for migraine today

## 2024-09-27 NOTE — Progress Notes (Signed)
 Botox  injection for chronic migraine prophylaxis.  Indication: History of migraines with greater than 15 headaches per month despite trials of oral medications.  Informed consent was obtained after discussing risks and benefits of the procedure with the patient this included bleeding bruising infection as well as facial drooping, eyelid lag, systemic effects of Botox . Patient elects to proceed and has given written consent.  Patient placed in a seated position Dilution 50 units per cc  Muscles injected with dose  Procerus 5 units Corrugator 5 units bilateral Frontalis 5 units 2 injection sites on the right and 2 injection sites on the left Temporalis 5 units in 4 injection sites on the right and 4 injection sites on the left Occipitalis 5 units into 3 injections sites on the right and 3 injection sites on the left  Cervical paraspinals 5 units into 2 injection sites on the right and 2 injection sites on the left Trapezius 5U x 4 Right and 5U x 3 Left   RIght splenius cervicis 10U Right levator scap 15U Tight T1 paraspinal 10U Waste 10U Patient tolerated procedure well. Post procedure instructions given.

## 2024-10-07 ENCOUNTER — Other Ambulatory Visit (HOSPITAL_COMMUNITY): Payer: Self-pay

## 2024-10-10 ENCOUNTER — Other Ambulatory Visit: Payer: Self-pay

## 2024-10-10 ENCOUNTER — Other Ambulatory Visit (HOSPITAL_COMMUNITY): Payer: Self-pay

## 2024-10-11 ENCOUNTER — Other Ambulatory Visit (HOSPITAL_COMMUNITY): Payer: Self-pay

## 2024-10-19 ENCOUNTER — Other Ambulatory Visit (HOSPITAL_COMMUNITY): Payer: Self-pay

## 2024-10-19 ENCOUNTER — Other Ambulatory Visit: Payer: Self-pay

## 2024-10-22 ENCOUNTER — Other Ambulatory Visit (HOSPITAL_COMMUNITY): Payer: Self-pay

## 2024-10-23 ENCOUNTER — Other Ambulatory Visit: Payer: Self-pay

## 2024-10-24 ENCOUNTER — Other Ambulatory Visit (HOSPITAL_COMMUNITY): Payer: Self-pay

## 2024-10-25 ENCOUNTER — Other Ambulatory Visit (HOSPITAL_COMMUNITY): Payer: Self-pay

## 2024-10-26 ENCOUNTER — Other Ambulatory Visit: Payer: Self-pay

## 2024-10-26 ENCOUNTER — Other Ambulatory Visit (HOSPITAL_COMMUNITY): Payer: Self-pay

## 2024-10-27 ENCOUNTER — Other Ambulatory Visit (HOSPITAL_COMMUNITY): Payer: Self-pay

## 2024-10-28 ENCOUNTER — Other Ambulatory Visit (HOSPITAL_COMMUNITY): Payer: Self-pay

## 2024-10-28 ENCOUNTER — Other Ambulatory Visit: Payer: Self-pay

## 2024-10-29 ENCOUNTER — Other Ambulatory Visit (HOSPITAL_COMMUNITY): Payer: Self-pay

## 2024-10-31 ENCOUNTER — Other Ambulatory Visit (HOSPITAL_COMMUNITY): Payer: Self-pay

## 2024-10-31 MED ORDER — METOCLOPRAMIDE HCL 5 MG PO TABS
5.0000 mg | ORAL_TABLET | Freq: Every day | ORAL | 1 refills | Status: AC
  Filled 2024-10-31: qty 30, 30d supply, fill #0

## 2024-11-01 ENCOUNTER — Other Ambulatory Visit (HOSPITAL_COMMUNITY): Payer: Self-pay

## 2024-11-02 ENCOUNTER — Other Ambulatory Visit (HOSPITAL_COMMUNITY): Payer: Self-pay

## 2024-11-02 ENCOUNTER — Other Ambulatory Visit: Payer: Self-pay

## 2024-11-02 ENCOUNTER — Other Ambulatory Visit: Payer: Self-pay | Admitting: Internal Medicine

## 2024-11-02 MED ORDER — FINASTERIDE 5 MG PO TABS
5.0000 mg | ORAL_TABLET | Freq: Every day | ORAL | 3 refills | Status: AC
  Filled 2024-11-02: qty 90, 90d supply, fill #0

## 2024-11-02 MED ORDER — MOVANTIK 25 MG PO TABS
25.0000 mg | ORAL_TABLET | Freq: Every day | ORAL | 3 refills | Status: AC
  Filled 2024-11-02: qty 30, 30d supply, fill #0

## 2024-11-02 MED ORDER — CYCLOBENZAPRINE HCL 10 MG PO TABS
10.0000 mg | ORAL_TABLET | Freq: Three times a day (TID) | ORAL | 1 refills | Status: AC | PRN
  Filled 2024-11-02: qty 270, 90d supply, fill #0

## 2024-11-02 MED ORDER — TADALAFIL 5 MG PO TABS
5.0000 mg | ORAL_TABLET | Freq: Every day | ORAL | 3 refills | Status: AC
  Filled 2024-11-02: qty 90, 90d supply, fill #0

## 2024-11-02 MED ORDER — METFORMIN HCL 1000 MG PO TABS
1000.0000 mg | ORAL_TABLET | Freq: Two times a day (BID) | ORAL | 3 refills | Status: AC
  Filled 2024-11-02: qty 180, 90d supply, fill #0

## 2024-11-02 MED ORDER — LIDOCAINE 5 % EX PTCH
2.0000 | MEDICATED_PATCH | Freq: Two times a day (BID) | CUTANEOUS | 3 refills | Status: AC
  Filled 2024-11-02: qty 90, 45d supply, fill #0

## 2024-11-02 MED ORDER — XARELTO 20 MG PO TABS
20.0000 mg | ORAL_TABLET | Freq: Every day | ORAL | 3 refills | Status: AC
  Filled 2024-11-02: qty 90, 90d supply, fill #0

## 2024-11-02 MED ORDER — BACLOFEN 10 MG PO TABS
10.0000 mg | ORAL_TABLET | Freq: Two times a day (BID) | ORAL | 1 refills | Status: AC | PRN
  Filled 2024-11-02: qty 360, 90d supply, fill #0

## 2024-11-02 MED ORDER — TOPIRAMATE 25 MG PO TABS
25.0000 mg | ORAL_TABLET | Freq: Every day | ORAL | 3 refills | Status: AC
  Filled 2024-11-02: qty 90, 90d supply, fill #0

## 2024-11-02 MED ORDER — FLUCONAZOLE 100 MG PO TABS
ORAL_TABLET | ORAL | 3 refills | Status: AC
  Filled 2024-11-02: qty 16, 15d supply, fill #0
  Filled 2024-11-17: qty 16, 15d supply, fill #1

## 2024-11-02 MED ORDER — HYDROCORTISONE ACETATE 25 MG RE SUPP
25.0000 mg | Freq: Two times a day (BID) | RECTAL | 3 refills | Status: AC | PRN
  Filled 2024-11-02: qty 20, 10d supply, fill #0

## 2024-11-02 MED ORDER — LIDOCAINE 4 % EX CREA
TOPICAL_CREAM | CUTANEOUS | 3 refills | Status: AC
  Filled 2024-11-02: qty 30, 10d supply, fill #0
  Filled 2024-11-12: qty 30, 10d supply, fill #1

## 2024-11-02 MED ORDER — DICLOFENAC SODIUM 1 % EX GEL
Freq: Three times a day (TID) | CUTANEOUS | 5 refills | Status: AC | PRN
  Filled 2024-11-02: qty 100, 30d supply, fill #0

## 2024-11-02 MED ORDER — TRAMADOL HCL 50 MG PO TABS
50.0000 mg | ORAL_TABLET | Freq: Four times a day (QID) | ORAL | 0 refills | Status: AC | PRN
  Filled 2024-11-02: qty 240, 30d supply, fill #0

## 2024-11-02 MED ORDER — METHOCARBAMOL 500 MG PO TABS
500.0000 mg | ORAL_TABLET | Freq: Two times a day (BID) | ORAL | 0 refills | Status: AC | PRN
  Filled 2024-11-02: qty 180, 90d supply, fill #0

## 2024-11-02 MED ORDER — OXYCODONE HCL 5 MG PO TABS
5.0000 mg | ORAL_TABLET | Freq: Three times a day (TID) | ORAL | 0 refills | Status: AC
  Filled 2024-11-02: qty 180, 30d supply, fill #0

## 2024-11-02 MED ORDER — ZOLPIDEM TARTRATE 10 MG PO TABS
10.0000 mg | ORAL_TABLET | Freq: Every evening | ORAL | 0 refills | Status: AC | PRN
  Filled 2024-11-02 – 2024-11-14 (×4): qty 90, 90d supply, fill #0

## 2024-11-02 MED ORDER — ROSUVASTATIN CALCIUM 20 MG PO TABS
20.0000 mg | ORAL_TABLET | Freq: Every day | ORAL | 3 refills | Status: AC
  Filled 2024-11-02: qty 90, 90d supply, fill #0

## 2024-11-02 MED ORDER — DIAZEPAM 5 MG PO TABS
5.0000 mg | ORAL_TABLET | Freq: Two times a day (BID) | ORAL | 0 refills | Status: AC
  Filled 2024-11-02: qty 60, 30d supply, fill #0

## 2024-11-02 MED ORDER — MOUNJARO 10 MG/0.5ML ~~LOC~~ SOAJ
10.0000 mg | SUBCUTANEOUS | 1 refills | Status: AC
  Filled 2024-11-02: qty 6, 84d supply, fill #0
  Filled 2024-11-09: qty 12, 168d supply, fill #0

## 2024-11-02 MED ORDER — ITRACONAZOLE 10 MG/ML PO SOLN
100.0000 mg | Freq: Two times a day (BID) | ORAL | 0 refills | Status: AC
  Filled 2024-11-02: qty 300, 15d supply, fill #0

## 2024-11-02 MED ORDER — RIZATRIPTAN BENZOATE 5 MG PO TBDP
ORAL_TABLET | ORAL | 3 refills | Status: AC
  Filled 2024-11-02: qty 15, 30d supply, fill #0

## 2024-11-02 MED ORDER — GABAPENTIN 600 MG PO TABS
600.0000 mg | ORAL_TABLET | Freq: Three times a day (TID) | ORAL | 3 refills | Status: AC
  Filled 2024-11-02: qty 270, 90d supply, fill #0

## 2024-11-02 MED ORDER — NURTEC 75 MG PO TBDP
75.0000 mg | ORAL_TABLET | Freq: Every day | ORAL | 3 refills | Status: AC | PRN
  Filled 2024-11-02: qty 18, 90d supply, fill #0

## 2024-11-02 MED ORDER — PANTOPRAZOLE SODIUM 40 MG PO TBEC
40.0000 mg | DELAYED_RELEASE_TABLET | Freq: Two times a day (BID) | ORAL | 3 refills | Status: AC
  Filled 2024-11-02: qty 180, 90d supply, fill #0

## 2024-11-03 ENCOUNTER — Other Ambulatory Visit (HOSPITAL_COMMUNITY): Payer: Self-pay

## 2024-11-03 MED ORDER — BUTALBITAL-APAP-CAFFEINE 50-325-40 MG PO TABS
1.0000 | ORAL_TABLET | ORAL | 0 refills | Status: AC | PRN
  Filled 2024-11-03: qty 180, 30d supply, fill #0

## 2024-11-04 ENCOUNTER — Other Ambulatory Visit (HOSPITAL_COMMUNITY): Payer: Self-pay

## 2024-11-04 ENCOUNTER — Other Ambulatory Visit: Payer: Self-pay

## 2024-11-07 ENCOUNTER — Other Ambulatory Visit (HOSPITAL_COMMUNITY): Payer: Self-pay

## 2024-11-09 ENCOUNTER — Other Ambulatory Visit (HOSPITAL_COMMUNITY): Payer: Self-pay

## 2024-11-14 ENCOUNTER — Other Ambulatory Visit (HOSPITAL_COMMUNITY): Payer: Self-pay

## 2024-11-16 ENCOUNTER — Other Ambulatory Visit (HOSPITAL_COMMUNITY): Payer: Self-pay

## 2024-11-17 ENCOUNTER — Other Ambulatory Visit (HOSPITAL_COMMUNITY): Payer: Self-pay

## 2024-11-17 ENCOUNTER — Other Ambulatory Visit: Payer: Self-pay
